# Patient Record
Sex: Female | Born: 1935 | Race: White | Hispanic: No | Smoking: Former smoker
Health system: Southern US, Community
[De-identification: ages and names within clinical notes are randomized; demographics above are authoritative.]

## PROBLEM LIST (undated history)

## (undated) DIAGNOSIS — K219 Gastro-esophageal reflux disease without esophagitis: Secondary | ICD-10-CM

## (undated) DIAGNOSIS — D649 Anemia, unspecified: Secondary | ICD-10-CM

## (undated) DIAGNOSIS — I10 Essential (primary) hypertension: Secondary | ICD-10-CM

## (undated) DIAGNOSIS — N189 Chronic kidney disease, unspecified: Secondary | ICD-10-CM

---

## 2003-02-25 ENCOUNTER — Ambulatory Visit (HOSPITAL_COMMUNITY): Admission: RE | Admit: 2003-02-25 | Discharge: 2003-02-25 | Payer: Self-pay | Admitting: Gastroenterology

## 2003-02-25 ENCOUNTER — Encounter (INDEPENDENT_AMBULATORY_CARE_PROVIDER_SITE_OTHER): Payer: Self-pay | Admitting: Specialist

## 2003-11-06 ENCOUNTER — Other Ambulatory Visit: Admission: RE | Admit: 2003-11-06 | Discharge: 2003-11-06 | Payer: Self-pay | Admitting: Internal Medicine

## 2005-10-20 ENCOUNTER — Ambulatory Visit (HOSPITAL_COMMUNITY): Admission: RE | Admit: 2005-10-20 | Discharge: 2005-10-20 | Payer: Self-pay | Admitting: Ophthalmology

## 2005-10-26 ENCOUNTER — Ambulatory Visit (HOSPITAL_COMMUNITY): Admission: RE | Admit: 2005-10-26 | Discharge: 2005-10-26 | Payer: Self-pay | Admitting: Ophthalmology

## 2005-12-02 ENCOUNTER — Other Ambulatory Visit: Admission: RE | Admit: 2005-12-02 | Discharge: 2005-12-02 | Payer: Self-pay | Admitting: Internal Medicine

## 2007-05-29 ENCOUNTER — Encounter: Admission: RE | Admit: 2007-05-29 | Discharge: 2007-05-29 | Payer: Self-pay | Admitting: Internal Medicine

## 2007-12-11 ENCOUNTER — Other Ambulatory Visit: Admission: RE | Admit: 2007-12-11 | Discharge: 2007-12-11 | Payer: Self-pay | Admitting: Internal Medicine

## 2010-08-25 ENCOUNTER — Other Ambulatory Visit: Payer: Self-pay | Admitting: Internal Medicine

## 2010-08-25 ENCOUNTER — Other Ambulatory Visit (HOSPITAL_COMMUNITY)
Admission: RE | Admit: 2010-08-25 | Discharge: 2010-08-25 | Disposition: A | Discharge status: Home / Self Care | Payer: Medicare Other | Source: Ambulatory Visit | Attending: Internal Medicine | Admitting: Internal Medicine

## 2010-08-25 DIAGNOSIS — Z01419 Encounter for gynecological examination (general) (routine) without abnormal findings: Secondary | ICD-10-CM | POA: Insufficient documentation

## 2010-08-25 DIAGNOSIS — Z1159 Encounter for screening for other viral diseases: Secondary | ICD-10-CM | POA: Insufficient documentation

## 2010-10-30 NOTE — Op Note (Signed)
[name], [name]            ACCOUNT NO.:  [id number]   MEDICAL RECORD NO.:  [id number]          PATIENT TYPE:  AMB   LOCATION:  SDS                          FACILITY:  [facility]   PHYSICIAN:  Robert L. Katy Fitch, M.D.  DATE OF BIRTH:  1928/12/02   DATE OF PROCEDURE:  10/26/2005  DATE OF DISCHARGE:                                 OPERATIVE REPORT   INDICATION AND JUSTIFICATION FOR THE PROCEDURE:  This 77 year old lady has  been followed in my office since November 2006 and initially had moderately  dense bilateral cataracts and has had cataract surgery and now can see 20/25  to about 20/20 with each eye.  She has had severe dermatochalasis with  obstruction of the superior visual field also and we discussed this as a  problem when she was seen October 01, 2005, several months after her cataract  procedures.  Visual field testing with the skin taped compared to when it  was not taped shows a large loss of the superior field and photographs  demonstrate the redundant skin, which is worse on the right than on the  left.  The margin reflex distance is 0 on the right and about 2 mm on the  left.  The problem was discussed and she decided to have upper eyelid  optical blepharoplasties to help her with these symptoms.  She reported that  the skin feels very heavy and that this droops over and blocks the superior  vision.  She can see better when she lifts the skin up.  Otherwise, pressure  are 16 and her vision is 20/25 without correction.  The pupils' motility,  conjunctivae, corneae, anterior chambers and fundus exams are unremarkable  and at the slit-lamp, she does have lens implants.  She has severe  dermatochalasis.  She decided to have upper lid blepharoplasty.   JUSTIFICATION FOR PERFORMING PROCEDURE IN OUTPATIENT SETTING:  Routine.   __________  None.   PREOPERATIVE DIAGNOSIS:  Severe dermatochalasis with visual impairment.   POSTOPERATIVE DIAGNOSIS:  Severe dermatochalasis with  visual impairment.   OPERATION PERFORMED:  Upper eyelid blepharoplasty.   SURGEON:  Robert L. Katy Fitch, M.D.   ANESTHESIA:  Xylocaine 1% with epinephrine.   PROCEDURE:  The patient arrived in the minor surgery room and was prepped  and draped in the routine fashion.  Xylocaine 1% with epinephrine was given  and the skin to be removed was demarcated and then excised along with some  underlying subcutaneous tissue.  Bleeding was controlled with cautery and  pressure and each wound was closed with a running 6-0 nylon suture.  Cold  compresses along with some Polysporin were applied and the patient left the  room, having done nicely.   FOLLOWUP CARE:  The patient is to be seen in my office in 6 days to have the  sutures removed.  She is to keep her head elevated today and use ice packs  today and warm compresses starting tomorrow.           ______________________________  Jaymes Graff Katy Fitch, M.D.     RLG/MEDQ  D:  10/26/2005  T:  10/27/2005  Job:  [id number]

## 2010-10-30 NOTE — Op Note (Signed)
   NAME:  Peggy [name], Peggy [name]                      ACCOUNT NO.:  [id number]   MEDICAL RECORD NO.:  [id number]                   PATIENT TYPE:  AMB   LOCATION:  ENDO                                 FACILITY:  [facility]   PHYSICIAN:  Earle Gell, M.D.                DATE OF BIRTH:  08-08-1928   DATE OF PROCEDURE:  02/25/2003  DATE OF DISCHARGE:                                 OPERATIVE REPORT   PROCEDURE:  Colonoscopy.   PROCEDURE INDICATION:  Ms. Peggy [name] is a 77 year old female born  16-May-1929.  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  She is due for a screening colonoscopy with polypectomy to prevent colon  cancer.   ENDOSCOPIST:  Earle Gell, M.D.   PREMEDICATION:  Versed 5 mg, Demerol 50 mg.   DESCRIPTION OF PROCEDURE:  After obtaining informed consent, Ms. Peggy [name]  was placed in the left lateral decubitus position.  I administered  intravenous Demerol and intravenous Versed to achieve conscious sedation for  the procedure.  The patient's blood pressure, oxygen saturation, and cardiac  rhythm were monitored throughout the procedure and documented in the medical  record.   Anal inspection was normal.  Digital rectal exam was normal.  The Olympus  pediatric colonoscope was introduced into the rectum and advanced to the  cecum.  Colonic preparation for the exam today was excellent.   Rectum normal.   Sigmoid colon and descending colon normal.   Splenic flexure normal.   Transverse colon normal.   Hepatic flexure normal.   Ascending colon normal.   Cecum and ileocecal valve normal.   Biopsies:  Random colonic biopsies were taken from the right colon and the  left colon to rule out microscopic-collagenous colitis.    ASSESSMENT:  Normal proctocolonoscopy to the cecum.  Random colonic biopsies  to rule out microscopic-collagenous colitis pending.                                               Earle Gell, M.D.    MJ/MEDQ  D:   02/25/2003  T:  02/25/2003  Job:  [id number]   cc:   Dwaine Deter, M.D.  [address]  [place] [address]  Fax: [phone]

## 2011-06-21 DIAGNOSIS — D2230 Melanocytic nevi of unspecified part of face: Secondary | ICD-10-CM | POA: Insufficient documentation

## 2011-06-21 DIAGNOSIS — H57819 Brow ptosis, unspecified: Secondary | ICD-10-CM | POA: Insufficient documentation

## 2011-06-21 DIAGNOSIS — Q828 Other specified congenital malformations of skin: Secondary | ICD-10-CM | POA: Diagnosis not present

## 2011-06-21 DIAGNOSIS — H02409 Unspecified ptosis of unspecified eyelid: Secondary | ICD-10-CM | POA: Diagnosis not present

## 2011-08-02 DIAGNOSIS — Q828 Other specified congenital malformations of skin: Secondary | ICD-10-CM | POA: Diagnosis not present

## 2011-08-02 DIAGNOSIS — H02409 Unspecified ptosis of unspecified eyelid: Secondary | ICD-10-CM | POA: Diagnosis not present

## 2011-08-12 DIAGNOSIS — Z01810 Encounter for preprocedural cardiovascular examination: Secondary | ICD-10-CM | POA: Diagnosis not present

## 2011-08-12 DIAGNOSIS — H02409 Unspecified ptosis of unspecified eyelid: Secondary | ICD-10-CM | POA: Diagnosis not present

## 2011-08-12 DIAGNOSIS — Z01812 Encounter for preprocedural laboratory examination: Secondary | ICD-10-CM | POA: Diagnosis not present

## 2011-08-12 DIAGNOSIS — Q828 Other specified congenital malformations of skin: Secondary | ICD-10-CM | POA: Diagnosis not present

## 2011-08-12 DIAGNOSIS — I10 Essential (primary) hypertension: Secondary | ICD-10-CM | POA: Diagnosis not present

## 2011-08-25 DIAGNOSIS — Z0000 Encounter for general adult medical examination without abnormal findings: Secondary | ICD-10-CM | POA: Diagnosis not present

## 2011-08-25 DIAGNOSIS — M949 Disorder of cartilage, unspecified: Secondary | ICD-10-CM | POA: Diagnosis not present

## 2011-08-25 DIAGNOSIS — B351 Tinea unguium: Secondary | ICD-10-CM | POA: Diagnosis not present

## 2011-08-25 DIAGNOSIS — K219 Gastro-esophageal reflux disease without esophagitis: Secondary | ICD-10-CM | POA: Diagnosis not present

## 2011-08-25 DIAGNOSIS — Z79899 Other long term (current) drug therapy: Secondary | ICD-10-CM | POA: Diagnosis not present

## 2011-08-25 DIAGNOSIS — K5289 Other specified noninfective gastroenteritis and colitis: Secondary | ICD-10-CM | POA: Diagnosis not present

## 2011-08-25 DIAGNOSIS — I10 Essential (primary) hypertension: Secondary | ICD-10-CM | POA: Diagnosis not present

## 2011-08-25 DIAGNOSIS — M899 Disorder of bone, unspecified: Secondary | ICD-10-CM | POA: Diagnosis not present

## 2011-08-27 DIAGNOSIS — I10 Essential (primary) hypertension: Secondary | ICD-10-CM | POA: Diagnosis not present

## 2011-08-27 DIAGNOSIS — H02409 Unspecified ptosis of unspecified eyelid: Secondary | ICD-10-CM | POA: Diagnosis not present

## 2011-08-27 DIAGNOSIS — Z7982 Long term (current) use of aspirin: Secondary | ICD-10-CM | POA: Diagnosis not present

## 2011-08-27 DIAGNOSIS — Z79899 Other long term (current) drug therapy: Secondary | ICD-10-CM | POA: Diagnosis not present

## 2011-08-27 DIAGNOSIS — Z87891 Personal history of nicotine dependence: Secondary | ICD-10-CM | POA: Diagnosis not present

## 2011-08-27 DIAGNOSIS — R6889 Other general symptoms and signs: Secondary | ICD-10-CM | POA: Diagnosis not present

## 2011-10-06 DIAGNOSIS — B351 Tinea unguium: Secondary | ICD-10-CM | POA: Diagnosis not present

## 2011-10-06 DIAGNOSIS — R197 Diarrhea, unspecified: Secondary | ICD-10-CM | POA: Diagnosis not present

## 2011-10-06 DIAGNOSIS — Z79899 Other long term (current) drug therapy: Secondary | ICD-10-CM | POA: Diagnosis not present

## 2011-10-29 DIAGNOSIS — R197 Diarrhea, unspecified: Secondary | ICD-10-CM | POA: Diagnosis not present

## 2011-11-18 DIAGNOSIS — Z1231 Encounter for screening mammogram for malignant neoplasm of breast: Secondary | ICD-10-CM | POA: Diagnosis not present

## 2011-11-25 DIAGNOSIS — Z961 Presence of intraocular lens: Secondary | ICD-10-CM | POA: Diagnosis not present

## 2011-11-25 DIAGNOSIS — H26499 Other secondary cataract, unspecified eye: Secondary | ICD-10-CM | POA: Diagnosis not present

## 2011-11-25 DIAGNOSIS — H02059 Trichiasis without entropian unspecified eye, unspecified eyelid: Secondary | ICD-10-CM | POA: Diagnosis not present

## 2011-11-29 DIAGNOSIS — R197 Diarrhea, unspecified: Secondary | ICD-10-CM | POA: Diagnosis not present

## 2012-03-24 DIAGNOSIS — Z23 Encounter for immunization: Secondary | ICD-10-CM | POA: Diagnosis not present

## 2012-05-03 DIAGNOSIS — L989 Disorder of the skin and subcutaneous tissue, unspecified: Secondary | ICD-10-CM | POA: Diagnosis not present

## 2012-05-03 DIAGNOSIS — M79609 Pain in unspecified limb: Secondary | ICD-10-CM | POA: Diagnosis not present

## 2012-08-29 DIAGNOSIS — K5289 Other specified noninfective gastroenteritis and colitis: Secondary | ICD-10-CM | POA: Diagnosis not present

## 2012-08-29 DIAGNOSIS — H6120 Impacted cerumen, unspecified ear: Secondary | ICD-10-CM | POA: Diagnosis not present

## 2012-08-29 DIAGNOSIS — Z1331 Encounter for screening for depression: Secondary | ICD-10-CM | POA: Diagnosis not present

## 2012-08-29 DIAGNOSIS — Z79899 Other long term (current) drug therapy: Secondary | ICD-10-CM | POA: Diagnosis not present

## 2012-08-29 DIAGNOSIS — I10 Essential (primary) hypertension: Secondary | ICD-10-CM | POA: Diagnosis not present

## 2012-08-29 DIAGNOSIS — Z0000 Encounter for general adult medical examination without abnormal findings: Secondary | ICD-10-CM | POA: Diagnosis not present

## 2012-10-31 DIAGNOSIS — M7150 Other bursitis, not elsewhere classified, unspecified site: Secondary | ICD-10-CM | POA: Diagnosis not present

## 2012-10-31 DIAGNOSIS — IMO0002 Reserved for concepts with insufficient information to code with codable children: Secondary | ICD-10-CM | POA: Diagnosis not present

## 2012-10-31 DIAGNOSIS — M79609 Pain in unspecified limb: Secondary | ICD-10-CM | POA: Diagnosis not present

## 2012-10-31 DIAGNOSIS — M2040 Other hammer toe(s) (acquired), unspecified foot: Secondary | ICD-10-CM | POA: Diagnosis not present

## 2012-11-07 DIAGNOSIS — M659 Synovitis and tenosynovitis, unspecified: Secondary | ICD-10-CM | POA: Diagnosis not present

## 2013-05-21 DIAGNOSIS — M5430 Sciatica, unspecified side: Secondary | ICD-10-CM | POA: Diagnosis not present

## 2013-06-17 DIAGNOSIS — J069 Acute upper respiratory infection, unspecified: Secondary | ICD-10-CM | POA: Diagnosis not present

## 2013-09-04 DIAGNOSIS — E559 Vitamin D deficiency, unspecified: Secondary | ICD-10-CM | POA: Diagnosis not present

## 2013-09-04 DIAGNOSIS — L84 Corns and callosities: Secondary | ICD-10-CM | POA: Diagnosis not present

## 2013-09-04 DIAGNOSIS — Z1331 Encounter for screening for depression: Secondary | ICD-10-CM | POA: Diagnosis not present

## 2013-09-04 DIAGNOSIS — Z0000 Encounter for general adult medical examination without abnormal findings: Secondary | ICD-10-CM | POA: Diagnosis not present

## 2013-09-04 DIAGNOSIS — I10 Essential (primary) hypertension: Secondary | ICD-10-CM | POA: Diagnosis not present

## 2013-09-04 DIAGNOSIS — K5289 Other specified noninfective gastroenteritis and colitis: Secondary | ICD-10-CM | POA: Diagnosis not present

## 2013-09-04 DIAGNOSIS — Z79899 Other long term (current) drug therapy: Secondary | ICD-10-CM | POA: Diagnosis not present

## 2013-11-06 DIAGNOSIS — L84 Corns and callosities: Secondary | ICD-10-CM | POA: Diagnosis not present

## 2013-11-06 DIAGNOSIS — L97509 Non-pressure chronic ulcer of other part of unspecified foot with unspecified severity: Secondary | ICD-10-CM | POA: Diagnosis not present

## 2013-11-27 DIAGNOSIS — H02839 Dermatochalasis of unspecified eye, unspecified eyelid: Secondary | ICD-10-CM | POA: Diagnosis not present

## 2013-11-27 DIAGNOSIS — Z961 Presence of intraocular lens: Secondary | ICD-10-CM | POA: Diagnosis not present

## 2013-11-27 DIAGNOSIS — H02059 Trichiasis without entropian unspecified eye, unspecified eyelid: Secondary | ICD-10-CM | POA: Diagnosis not present

## 2013-11-27 DIAGNOSIS — H1045 Other chronic allergic conjunctivitis: Secondary | ICD-10-CM | POA: Diagnosis not present

## 2013-11-27 DIAGNOSIS — H04129 Dry eye syndrome of unspecified lacrimal gland: Secondary | ICD-10-CM | POA: Diagnosis not present

## 2013-12-07 DIAGNOSIS — M2040 Other hammer toe(s) (acquired), unspecified foot: Secondary | ICD-10-CM | POA: Diagnosis not present

## 2014-01-01 DIAGNOSIS — S81809A Unspecified open wound, unspecified lower leg, initial encounter: Secondary | ICD-10-CM | POA: Diagnosis not present

## 2014-01-01 DIAGNOSIS — S81009A Unspecified open wound, unspecified knee, initial encounter: Secondary | ICD-10-CM | POA: Diagnosis not present

## 2014-01-09 DIAGNOSIS — S81009A Unspecified open wound, unspecified knee, initial encounter: Secondary | ICD-10-CM | POA: Diagnosis not present

## 2014-01-09 DIAGNOSIS — S81809A Unspecified open wound, unspecified lower leg, initial encounter: Secondary | ICD-10-CM | POA: Diagnosis not present

## 2014-01-09 DIAGNOSIS — I10 Essential (primary) hypertension: Secondary | ICD-10-CM | POA: Diagnosis not present

## 2014-01-09 DIAGNOSIS — K5289 Other specified noninfective gastroenteritis and colitis: Secondary | ICD-10-CM | POA: Diagnosis not present

## 2014-01-09 DIAGNOSIS — L84 Corns and callosities: Secondary | ICD-10-CM | POA: Diagnosis not present

## 2014-01-09 DIAGNOSIS — R229 Localized swelling, mass and lump, unspecified: Secondary | ICD-10-CM | POA: Diagnosis not present

## 2014-02-25 DIAGNOSIS — S81009A Unspecified open wound, unspecified knee, initial encounter: Secondary | ICD-10-CM | POA: Diagnosis not present

## 2014-02-25 DIAGNOSIS — Z4802 Encounter for removal of sutures: Secondary | ICD-10-CM | POA: Diagnosis not present

## 2014-02-25 DIAGNOSIS — S81809A Unspecified open wound, unspecified lower leg, initial encounter: Secondary | ICD-10-CM | POA: Diagnosis not present

## 2014-03-29 DIAGNOSIS — I8312 Varicose veins of left lower extremity with inflammation: Secondary | ICD-10-CM | POA: Diagnosis not present

## 2014-03-29 DIAGNOSIS — I8311 Varicose veins of right lower extremity with inflammation: Secondary | ICD-10-CM | POA: Diagnosis not present

## 2014-03-29 DIAGNOSIS — D692 Other nonthrombocytopenic purpura: Secondary | ICD-10-CM | POA: Diagnosis not present

## 2014-04-23 DIAGNOSIS — L602 Onychogryphosis: Secondary | ICD-10-CM | POA: Diagnosis not present

## 2014-04-23 DIAGNOSIS — L84 Corns and callosities: Secondary | ICD-10-CM | POA: Diagnosis not present

## 2014-04-23 DIAGNOSIS — M2041 Other hammer toe(s) (acquired), right foot: Secondary | ICD-10-CM | POA: Diagnosis not present

## 2014-04-23 DIAGNOSIS — M2042 Other hammer toe(s) (acquired), left foot: Secondary | ICD-10-CM | POA: Diagnosis not present

## 2014-09-10 DIAGNOSIS — M859 Disorder of bone density and structure, unspecified: Secondary | ICD-10-CM | POA: Diagnosis not present

## 2014-09-10 DIAGNOSIS — K589 Irritable bowel syndrome without diarrhea: Secondary | ICD-10-CM | POA: Diagnosis not present

## 2014-09-10 DIAGNOSIS — K219 Gastro-esophageal reflux disease without esophagitis: Secondary | ICD-10-CM | POA: Diagnosis not present

## 2014-09-10 DIAGNOSIS — Z79899 Other long term (current) drug therapy: Secondary | ICD-10-CM | POA: Diagnosis not present

## 2014-09-10 DIAGNOSIS — Z1389 Encounter for screening for other disorder: Secondary | ICD-10-CM | POA: Diagnosis not present

## 2014-09-10 DIAGNOSIS — Z23 Encounter for immunization: Secondary | ICD-10-CM | POA: Diagnosis not present

## 2014-09-10 DIAGNOSIS — Z0001 Encounter for general adult medical examination with abnormal findings: Secondary | ICD-10-CM | POA: Diagnosis not present

## 2014-09-10 DIAGNOSIS — S81802A Unspecified open wound, left lower leg, initial encounter: Secondary | ICD-10-CM | POA: Diagnosis not present

## 2014-09-10 DIAGNOSIS — I10 Essential (primary) hypertension: Secondary | ICD-10-CM | POA: Diagnosis not present

## 2014-09-10 LAB — BASIC METABOLIC PANEL
BUN: 21 (ref 4–21)
CREATININE: 0.7 (ref 0.5–1.1)
Glucose: 90
Potassium: 4.1 (ref 3.4–5.3)
Sodium: 133 — AB (ref 137–147)

## 2014-09-10 LAB — CBC AND DIFFERENTIAL
HEMATOCRIT: 35 — AB (ref 36–46)
HEMOGLOBIN: 11.7 — AB (ref 12.0–16.0)
PLATELETS: 252 (ref 150–399)
WBC: 6.8

## 2014-09-10 LAB — TSH: TSH: 1.15 (ref 0.41–5.90)

## 2014-09-10 LAB — LIPID PANEL
CHOLESTEROL: 181 (ref 0–200)
HDL: 118 — AB (ref 35–70)

## 2014-09-18 DIAGNOSIS — Z1231 Encounter for screening mammogram for malignant neoplasm of breast: Secondary | ICD-10-CM | POA: Diagnosis not present

## 2014-09-25 DIAGNOSIS — M5432 Sciatica, left side: Secondary | ICD-10-CM | POA: Diagnosis not present

## 2014-10-08 DIAGNOSIS — Z961 Presence of intraocular lens: Secondary | ICD-10-CM | POA: Diagnosis not present

## 2014-10-08 DIAGNOSIS — H02834 Dermatochalasis of left upper eyelid: Secondary | ICD-10-CM | POA: Diagnosis not present

## 2014-10-08 DIAGNOSIS — H26492 Other secondary cataract, left eye: Secondary | ICD-10-CM | POA: Diagnosis not present

## 2014-10-08 DIAGNOSIS — H04123 Dry eye syndrome of bilateral lacrimal glands: Secondary | ICD-10-CM | POA: Diagnosis not present

## 2014-10-08 DIAGNOSIS — H02831 Dermatochalasis of right upper eyelid: Secondary | ICD-10-CM | POA: Diagnosis not present

## 2014-10-08 DIAGNOSIS — H10413 Chronic giant papillary conjunctivitis, bilateral: Secondary | ICD-10-CM | POA: Diagnosis not present

## 2014-10-09 DIAGNOSIS — D239 Other benign neoplasm of skin, unspecified: Secondary | ICD-10-CM | POA: Diagnosis not present

## 2014-10-09 DIAGNOSIS — I872 Venous insufficiency (chronic) (peripheral): Secondary | ICD-10-CM | POA: Diagnosis not present

## 2014-12-30 DIAGNOSIS — L719 Rosacea, unspecified: Secondary | ICD-10-CM | POA: Diagnosis not present

## 2014-12-30 DIAGNOSIS — I872 Venous insufficiency (chronic) (peripheral): Secondary | ICD-10-CM | POA: Diagnosis not present

## 2015-01-06 DIAGNOSIS — H02054 Trichiasis without entropian left upper eyelid: Secondary | ICD-10-CM | POA: Diagnosis not present

## 2015-03-13 DIAGNOSIS — Z23 Encounter for immunization: Secondary | ICD-10-CM | POA: Diagnosis not present

## 2015-03-14 DIAGNOSIS — H26492 Other secondary cataract, left eye: Secondary | ICD-10-CM | POA: Diagnosis not present

## 2015-03-14 DIAGNOSIS — Z961 Presence of intraocular lens: Secondary | ICD-10-CM | POA: Diagnosis not present

## 2015-03-14 DIAGNOSIS — H02059 Trichiasis without entropian unspecified eye, unspecified eyelid: Secondary | ICD-10-CM | POA: Diagnosis not present

## 2015-06-12 DIAGNOSIS — H02834 Dermatochalasis of left upper eyelid: Secondary | ICD-10-CM | POA: Diagnosis not present

## 2015-06-12 DIAGNOSIS — H01025 Squamous blepharitis left lower eyelid: Secondary | ICD-10-CM | POA: Diagnosis not present

## 2015-06-12 DIAGNOSIS — H02054 Trichiasis without entropian left upper eyelid: Secondary | ICD-10-CM | POA: Diagnosis not present

## 2015-06-12 DIAGNOSIS — H01024 Squamous blepharitis left upper eyelid: Secondary | ICD-10-CM | POA: Diagnosis not present

## 2015-06-12 DIAGNOSIS — G245 Blepharospasm: Secondary | ICD-10-CM | POA: Diagnosis not present

## 2015-06-12 DIAGNOSIS — H01021 Squamous blepharitis right upper eyelid: Secondary | ICD-10-CM | POA: Diagnosis not present

## 2015-06-12 DIAGNOSIS — H01022 Squamous blepharitis right lower eyelid: Secondary | ICD-10-CM | POA: Diagnosis not present

## 2015-06-12 DIAGNOSIS — Z961 Presence of intraocular lens: Secondary | ICD-10-CM | POA: Diagnosis not present

## 2015-06-12 DIAGNOSIS — H02831 Dermatochalasis of right upper eyelid: Secondary | ICD-10-CM | POA: Diagnosis not present

## 2015-07-03 DIAGNOSIS — H02834 Dermatochalasis of left upper eyelid: Secondary | ICD-10-CM | POA: Diagnosis not present

## 2015-07-03 DIAGNOSIS — G245 Blepharospasm: Secondary | ICD-10-CM | POA: Diagnosis not present

## 2015-07-03 DIAGNOSIS — H01025 Squamous blepharitis left lower eyelid: Secondary | ICD-10-CM | POA: Diagnosis not present

## 2015-07-03 DIAGNOSIS — H01024 Squamous blepharitis left upper eyelid: Secondary | ICD-10-CM | POA: Diagnosis not present

## 2015-07-03 DIAGNOSIS — H02054 Trichiasis without entropian left upper eyelid: Secondary | ICD-10-CM | POA: Diagnosis not present

## 2015-07-03 DIAGNOSIS — H01022 Squamous blepharitis right lower eyelid: Secondary | ICD-10-CM | POA: Diagnosis not present

## 2015-07-03 DIAGNOSIS — H02831 Dermatochalasis of right upper eyelid: Secondary | ICD-10-CM | POA: Diagnosis not present

## 2015-07-03 DIAGNOSIS — H01021 Squamous blepharitis right upper eyelid: Secondary | ICD-10-CM | POA: Diagnosis not present

## 2015-07-03 DIAGNOSIS — Z961 Presence of intraocular lens: Secondary | ICD-10-CM | POA: Diagnosis not present

## 2015-08-01 DIAGNOSIS — H10022 Other mucopurulent conjunctivitis, left eye: Secondary | ICD-10-CM | POA: Diagnosis not present

## 2015-08-01 DIAGNOSIS — H01021 Squamous blepharitis right upper eyelid: Secondary | ICD-10-CM | POA: Diagnosis not present

## 2015-08-01 DIAGNOSIS — Z961 Presence of intraocular lens: Secondary | ICD-10-CM | POA: Diagnosis not present

## 2015-08-01 DIAGNOSIS — H01024 Squamous blepharitis left upper eyelid: Secondary | ICD-10-CM | POA: Diagnosis not present

## 2015-08-01 DIAGNOSIS — H02831 Dermatochalasis of right upper eyelid: Secondary | ICD-10-CM | POA: Diagnosis not present

## 2015-08-01 DIAGNOSIS — G245 Blepharospasm: Secondary | ICD-10-CM | POA: Diagnosis not present

## 2015-08-01 DIAGNOSIS — H01022 Squamous blepharitis right lower eyelid: Secondary | ICD-10-CM | POA: Diagnosis not present

## 2015-08-01 DIAGNOSIS — H01025 Squamous blepharitis left lower eyelid: Secondary | ICD-10-CM | POA: Diagnosis not present

## 2015-08-01 DIAGNOSIS — H02054 Trichiasis without entropian left upper eyelid: Secondary | ICD-10-CM | POA: Diagnosis not present

## 2015-08-01 DIAGNOSIS — H02834 Dermatochalasis of left upper eyelid: Secondary | ICD-10-CM | POA: Diagnosis not present

## 2015-09-12 DIAGNOSIS — Z961 Presence of intraocular lens: Secondary | ICD-10-CM | POA: Diagnosis not present

## 2015-09-12 DIAGNOSIS — H10022 Other mucopurulent conjunctivitis, left eye: Secondary | ICD-10-CM | POA: Diagnosis not present

## 2015-09-12 DIAGNOSIS — H02834 Dermatochalasis of left upper eyelid: Secondary | ICD-10-CM | POA: Diagnosis not present

## 2015-09-12 DIAGNOSIS — H01024 Squamous blepharitis left upper eyelid: Secondary | ICD-10-CM | POA: Diagnosis not present

## 2015-09-12 DIAGNOSIS — G245 Blepharospasm: Secondary | ICD-10-CM | POA: Diagnosis not present

## 2015-09-12 DIAGNOSIS — H02054 Trichiasis without entropian left upper eyelid: Secondary | ICD-10-CM | POA: Diagnosis not present

## 2015-09-12 DIAGNOSIS — H02831 Dermatochalasis of right upper eyelid: Secondary | ICD-10-CM | POA: Diagnosis not present

## 2015-09-12 DIAGNOSIS — H01025 Squamous blepharitis left lower eyelid: Secondary | ICD-10-CM | POA: Diagnosis not present

## 2015-09-12 DIAGNOSIS — H01022 Squamous blepharitis right lower eyelid: Secondary | ICD-10-CM | POA: Diagnosis not present

## 2015-09-12 DIAGNOSIS — H01021 Squamous blepharitis right upper eyelid: Secondary | ICD-10-CM | POA: Diagnosis not present

## 2015-09-16 DIAGNOSIS — Z79899 Other long term (current) drug therapy: Secondary | ICD-10-CM | POA: Diagnosis not present

## 2015-09-16 DIAGNOSIS — K589 Irritable bowel syndrome without diarrhea: Secondary | ICD-10-CM | POA: Diagnosis not present

## 2015-09-16 DIAGNOSIS — I10 Essential (primary) hypertension: Secondary | ICD-10-CM | POA: Diagnosis not present

## 2015-09-16 DIAGNOSIS — M859 Disorder of bone density and structure, unspecified: Secondary | ICD-10-CM | POA: Diagnosis not present

## 2015-09-16 DIAGNOSIS — K219 Gastro-esophageal reflux disease without esophagitis: Secondary | ICD-10-CM | POA: Diagnosis not present

## 2015-09-16 DIAGNOSIS — Z0001 Encounter for general adult medical examination with abnormal findings: Secondary | ICD-10-CM | POA: Diagnosis not present

## 2015-09-16 DIAGNOSIS — M5432 Sciatica, left side: Secondary | ICD-10-CM | POA: Diagnosis not present

## 2015-09-16 DIAGNOSIS — M40209 Unspecified kyphosis, site unspecified: Secondary | ICD-10-CM | POA: Diagnosis not present

## 2015-09-16 DIAGNOSIS — Z1389 Encounter for screening for other disorder: Secondary | ICD-10-CM | POA: Diagnosis not present

## 2015-09-16 DIAGNOSIS — E559 Vitamin D deficiency, unspecified: Secondary | ICD-10-CM | POA: Diagnosis not present

## 2015-09-16 LAB — VITAMIN D 25 HYDROXY (VIT D DEFICIENCY, FRACTURES): Vit D, 25-Hydroxy: 62.5

## 2015-09-16 LAB — BASIC METABOLIC PANEL
BUN: 20 (ref 4–21)
CREATININE: 0.8 (ref 0.5–1.1)
Glucose: 89
POTASSIUM: 4.4 (ref 3.4–5.3)
SODIUM: 135 — AB (ref 137–147)

## 2015-09-16 LAB — HEPATIC FUNCTION PANEL
ALT: 14 (ref 7–35)
AST: 23 (ref 13–35)
Alkaline Phosphatase: 48 (ref 25–125)
BILIRUBIN, TOTAL: 0.5

## 2015-09-16 LAB — TSH: TSH: 1.18 (ref 0.41–5.90)

## 2015-09-16 LAB — CBC AND DIFFERENTIAL
HEMATOCRIT: 36 (ref 36–46)
Hemoglobin: 12.2 (ref 12.0–16.0)
PLATELETS: 216 (ref 150–399)
WBC: 5.9

## 2015-09-16 LAB — LIPID PANEL
CHOLESTEROL: 134 (ref 0–200)
HDL: 50 (ref 35–70)
LDL Cholesterol: 69
TRIGLYCERIDES: 75 (ref 40–160)

## 2015-10-22 DIAGNOSIS — M859 Disorder of bone density and structure, unspecified: Secondary | ICD-10-CM | POA: Diagnosis not present

## 2015-10-22 DIAGNOSIS — M8589 Other specified disorders of bone density and structure, multiple sites: Secondary | ICD-10-CM | POA: Diagnosis not present

## 2015-11-27 DIAGNOSIS — H02052 Trichiasis without entropian right lower eyelid: Secondary | ICD-10-CM | POA: Diagnosis not present

## 2015-11-27 DIAGNOSIS — H02834 Dermatochalasis of left upper eyelid: Secondary | ICD-10-CM | POA: Diagnosis not present

## 2015-11-27 DIAGNOSIS — H02051 Trichiasis without entropian right upper eyelid: Secondary | ICD-10-CM | POA: Diagnosis not present

## 2015-11-27 DIAGNOSIS — H02831 Dermatochalasis of right upper eyelid: Secondary | ICD-10-CM | POA: Diagnosis not present

## 2015-11-27 DIAGNOSIS — H02054 Trichiasis without entropian left upper eyelid: Secondary | ICD-10-CM | POA: Diagnosis not present

## 2016-03-12 DIAGNOSIS — H01021 Squamous blepharitis right upper eyelid: Secondary | ICD-10-CM | POA: Diagnosis not present

## 2016-03-12 DIAGNOSIS — H02831 Dermatochalasis of right upper eyelid: Secondary | ICD-10-CM | POA: Diagnosis not present

## 2016-03-12 DIAGNOSIS — H02054 Trichiasis without entropian left upper eyelid: Secondary | ICD-10-CM | POA: Diagnosis not present

## 2016-03-12 DIAGNOSIS — H02834 Dermatochalasis of left upper eyelid: Secondary | ICD-10-CM | POA: Diagnosis not present

## 2016-03-12 DIAGNOSIS — G245 Blepharospasm: Secondary | ICD-10-CM | POA: Diagnosis not present

## 2016-03-12 DIAGNOSIS — H01025 Squamous blepharitis left lower eyelid: Secondary | ICD-10-CM | POA: Diagnosis not present

## 2016-03-12 DIAGNOSIS — H01024 Squamous blepharitis left upper eyelid: Secondary | ICD-10-CM | POA: Diagnosis not present

## 2016-03-12 DIAGNOSIS — H01022 Squamous blepharitis right lower eyelid: Secondary | ICD-10-CM | POA: Diagnosis not present

## 2016-03-12 DIAGNOSIS — Z961 Presence of intraocular lens: Secondary | ICD-10-CM | POA: Diagnosis not present

## 2016-03-25 DIAGNOSIS — Z23 Encounter for immunization: Secondary | ICD-10-CM | POA: Diagnosis not present

## 2016-05-17 ENCOUNTER — Encounter (HOSPITAL_COMMUNITY): Payer: Self-pay

## 2016-05-17 ENCOUNTER — Emergency Department (HOSPITAL_COMMUNITY)
Admission: EM | Admit: 2016-05-17 | Discharge: 2016-05-17 | Disposition: A | Discharge status: Home / Self Care | Payer: Medicare Other | Source: Home / Self Care | Attending: Emergency Medicine | Admitting: Emergency Medicine

## 2016-05-17 ENCOUNTER — Emergency Department (HOSPITAL_COMMUNITY): Payer: Medicare Other

## 2016-05-17 DIAGNOSIS — Z7982 Long term (current) use of aspirin: Secondary | ICD-10-CM | POA: Insufficient documentation

## 2016-05-17 DIAGNOSIS — R7881 Bacteremia: Secondary | ICD-10-CM | POA: Diagnosis not present

## 2016-05-17 DIAGNOSIS — N3000 Acute cystitis without hematuria: Secondary | ICD-10-CM

## 2016-05-17 DIAGNOSIS — A419 Sepsis, unspecified organism: Secondary | ICD-10-CM | POA: Diagnosis not present

## 2016-05-17 DIAGNOSIS — E860 Dehydration: Secondary | ICD-10-CM | POA: Diagnosis not present

## 2016-05-17 DIAGNOSIS — E440 Moderate protein-calorie malnutrition: Secondary | ICD-10-CM | POA: Diagnosis not present

## 2016-05-17 DIAGNOSIS — N179 Acute kidney failure, unspecified: Secondary | ICD-10-CM | POA: Diagnosis not present

## 2016-05-17 DIAGNOSIS — N390 Urinary tract infection, site not specified: Secondary | ICD-10-CM | POA: Diagnosis not present

## 2016-05-17 DIAGNOSIS — Z681 Body mass index (BMI) 19 or less, adult: Secondary | ICD-10-CM | POA: Diagnosis not present

## 2016-05-17 DIAGNOSIS — I10 Essential (primary) hypertension: Secondary | ICD-10-CM

## 2016-05-17 DIAGNOSIS — R531 Weakness: Secondary | ICD-10-CM | POA: Diagnosis not present

## 2016-05-17 HISTORY — DX: Essential (primary) hypertension: I10

## 2016-05-17 LAB — URINALYSIS, ROUTINE W REFLEX MICROSCOPIC
BILIRUBIN URINE: NEGATIVE
GLUCOSE, UA: NEGATIVE mg/dL
Ketones, ur: NEGATIVE mg/dL
Nitrite: NEGATIVE
PH: 6 (ref 5.0–8.0)
Protein, ur: >300 mg/dL — AB
SPECIFIC GRAVITY, URINE: 1.015 (ref 1.005–1.030)

## 2016-05-17 LAB — COMPREHENSIVE METABOLIC PANEL
ALT: 30 U/L (ref 14–54)
AST: 37 U/L (ref 15–41)
Albumin: 3.3 g/dL — ABNORMAL LOW (ref 3.5–5.0)
Alkaline Phosphatase: 85 U/L (ref 38–126)
Anion gap: 11 (ref 5–15)
BUN: 43 mg/dL — ABNORMAL HIGH (ref 6–20)
CHLORIDE: 91 mmol/L — AB (ref 101–111)
CO2: 26 mmol/L (ref 22–32)
CREATININE: 1.73 mg/dL — AB (ref 0.44–1.00)
Calcium: 9.3 mg/dL (ref 8.9–10.3)
GFR, EST AFRICAN AMERICAN: 30 mL/min — AB (ref >60)
GFR, EST NON AFRICAN AMERICAN: 26 mL/min — AB (ref >60)
Glucose, Bld: 143 mg/dL — ABNORMAL HIGH (ref 65–99)
POTASSIUM: 3.5 mmol/L (ref 3.5–5.1)
SODIUM: 128 mmol/L — AB (ref 135–145)
Total Bilirubin: 0.9 mg/dL (ref 0.3–1.2)
Total Protein: 6.7 g/dL (ref 6.5–8.1)

## 2016-05-17 LAB — CBC WITH DIFFERENTIAL/PLATELET
Basophils Absolute: 0 10*3/uL (ref 0.0–0.1)
Basophils Relative: 0 %
EOS ABS: 0 10*3/uL (ref 0.0–0.7)
Eosinophils Relative: 0 %
HEMATOCRIT: 32.1 % — AB (ref 36.0–46.0)
HEMOGLOBIN: 11.1 g/dL — AB (ref 12.0–15.0)
LYMPHS ABS: 0.4 10*3/uL — AB (ref 0.7–4.0)
LYMPHS PCT: 2 %
MCH: 32.8 pg (ref 26.0–34.0)
MCHC: 34.6 g/dL (ref 30.0–36.0)
MCV: 95 fL (ref 78.0–100.0)
MONOS PCT: 8 %
Monocytes Absolute: 1.5 10*3/uL — ABNORMAL HIGH (ref 0.1–1.0)
NEUTROS PCT: 90 %
Neutro Abs: 16.4 10*3/uL — ABNORMAL HIGH (ref 1.7–7.7)
Platelets: 197 10*3/uL (ref 150–400)
RBC: 3.38 MIL/uL — AB (ref 3.87–5.11)
RDW: 13.3 % (ref 11.5–15.5)
WBC: 18.3 10*3/uL — AB (ref 4.0–10.5)

## 2016-05-17 LAB — URINE MICROSCOPIC-ADD ON

## 2016-05-17 LAB — I-STAT CG4 LACTIC ACID, ED: LACTIC ACID, VENOUS: 1.59 mmol/L (ref 0.5–1.9)

## 2016-05-17 MED ORDER — ACETAMINOPHEN 500 MG PO TABS
1000.0000 mg | ORAL_TABLET | Freq: Once | ORAL | Status: AC
  Administered 2016-05-17: 1000 mg via ORAL
  Filled 2016-05-17: qty 2

## 2016-05-17 MED ORDER — CEPHALEXIN 500 MG PO CAPS: 500.0000 mg | ORAL_CAPSULE | Freq: Four times a day (QID) | ORAL | 0 refills | Status: DC

## 2016-05-17 MED ORDER — SODIUM CHLORIDE 0.9 % IV BOLUS (SEPSIS)
1000.0000 mL | Freq: Once | INTRAVENOUS | Status: AC
  Administered 2016-05-17: 1000 mL via INTRAVENOUS

## 2016-05-17 NOTE — ED Triage Notes (Signed)
Pt presents with c/o fever that has been persistent for the past couple of days. Pt's family member at bedside also reports that pt has had tremors and shake and has had episodes of delirium at night. Pt is alert at this time and able to answer questions. Pt reports max temp of 102.1 at home.

## 2016-05-18 ENCOUNTER — Encounter (HOSPITAL_COMMUNITY): Payer: Self-pay | Admitting: Emergency Medicine

## 2016-05-18 ENCOUNTER — Inpatient Hospital Stay (HOSPITAL_COMMUNITY)
Admission: EM | Admit: 2016-05-18 | Discharge: 2016-05-20 | DRG: 690 | Disposition: A | Discharge status: Home / Self Care | Payer: Medicare Other | Attending: Internal Medicine | Admitting: Internal Medicine

## 2016-05-18 DIAGNOSIS — N390 Urinary tract infection, site not specified: Secondary | ICD-10-CM

## 2016-05-18 DIAGNOSIS — R7881 Bacteremia: Secondary | ICD-10-CM | POA: Diagnosis not present

## 2016-05-18 DIAGNOSIS — Z681 Body mass index (BMI) 19 or less, adult: Secondary | ICD-10-CM

## 2016-05-18 DIAGNOSIS — Z79899 Other long term (current) drug therapy: Secondary | ICD-10-CM

## 2016-05-18 DIAGNOSIS — E860 Dehydration: Secondary | ICD-10-CM | POA: Diagnosis present

## 2016-05-18 DIAGNOSIS — I10 Essential (primary) hypertension: Secondary | ICD-10-CM | POA: Diagnosis not present

## 2016-05-18 DIAGNOSIS — N179 Acute kidney failure, unspecified: Secondary | ICD-10-CM | POA: Diagnosis not present

## 2016-05-18 DIAGNOSIS — B9620 Unspecified Escherichia coli [E. coli] as the cause of diseases classified elsewhere: Secondary | ICD-10-CM | POA: Diagnosis present

## 2016-05-18 DIAGNOSIS — Z7982 Long term (current) use of aspirin: Secondary | ICD-10-CM

## 2016-05-18 DIAGNOSIS — E440 Moderate protein-calorie malnutrition: Secondary | ICD-10-CM | POA: Insufficient documentation

## 2016-05-18 DIAGNOSIS — E876 Hypokalemia: Secondary | ICD-10-CM | POA: Diagnosis present

## 2016-05-18 DIAGNOSIS — K5900 Constipation, unspecified: Secondary | ICD-10-CM | POA: Diagnosis present

## 2016-05-18 DIAGNOSIS — E871 Hypo-osmolality and hyponatremia: Secondary | ICD-10-CM | POA: Diagnosis present

## 2016-05-18 LAB — I-STAT CG4 LACTIC ACID, ED: Lactic Acid, Venous: 0.97 mmol/L (ref 0.5–1.9)

## 2016-05-18 LAB — BLOOD CULTURE ID PANEL (REFLEXED)
Acinetobacter baumannii: NOT DETECTED
CANDIDA GLABRATA: NOT DETECTED
CANDIDA KRUSEI: NOT DETECTED
CANDIDA TROPICALIS: NOT DETECTED
Candida albicans: NOT DETECTED
Candida parapsilosis: NOT DETECTED
Carbapenem resistance: NOT DETECTED
ESCHERICHIA COLI: DETECTED — AB
Enterobacter cloacae complex: NOT DETECTED
Enterobacteriaceae species: DETECTED — AB
Enterococcus species: NOT DETECTED
Haemophilus influenzae: NOT DETECTED
Klebsiella oxytoca: NOT DETECTED
Klebsiella pneumoniae: NOT DETECTED
Listeria monocytogenes: NOT DETECTED
NEISSERIA MENINGITIDIS: NOT DETECTED
PROTEUS SPECIES: NOT DETECTED
Pseudomonas aeruginosa: NOT DETECTED
SERRATIA MARCESCENS: NOT DETECTED
STAPHYLOCOCCUS SPECIES: NOT DETECTED
STREPTOCOCCUS AGALACTIAE: NOT DETECTED
Staphylococcus aureus (BCID): NOT DETECTED
Streptococcus pneumoniae: NOT DETECTED
Streptococcus pyogenes: NOT DETECTED
Streptococcus species: NOT DETECTED

## 2016-05-18 LAB — BASIC METABOLIC PANEL
ANION GAP: 9 (ref 5–15)
BUN: 55 mg/dL — ABNORMAL HIGH (ref 6–20)
CALCIUM: 9 mg/dL (ref 8.9–10.3)
CO2: 25 mmol/L (ref 22–32)
Chloride: 93 mmol/L — ABNORMAL LOW (ref 101–111)
Creatinine, Ser: 1.91 mg/dL — ABNORMAL HIGH (ref 0.44–1.00)
GFR, EST AFRICAN AMERICAN: 26 mL/min — AB (ref >60)
GFR, EST NON AFRICAN AMERICAN: 23 mL/min — AB (ref >60)
Glucose, Bld: 121 mg/dL — ABNORMAL HIGH (ref 65–99)
POTASSIUM: 3.4 mmol/L — AB (ref 3.5–5.1)
SODIUM: 127 mmol/L — AB (ref 135–145)

## 2016-05-18 LAB — CBC WITH DIFFERENTIAL/PLATELET
BASOS ABS: 0 10*3/uL (ref 0.0–0.1)
BASOS PCT: 0 %
EOS PCT: 0 %
Eosinophils Absolute: 0 10*3/uL (ref 0.0–0.7)
HCT: 34.1 % — ABNORMAL LOW (ref 36.0–46.0)
Hemoglobin: 12.1 g/dL (ref 12.0–15.0)
LYMPHS PCT: 3 %
Lymphs Abs: 0.6 10*3/uL — ABNORMAL LOW (ref 0.7–4.0)
MCH: 32.7 pg (ref 26.0–34.0)
MCHC: 35.5 g/dL (ref 30.0–36.0)
MCV: 92.2 fL (ref 78.0–100.0)
MONO ABS: 1.1 10*3/uL — AB (ref 0.1–1.0)
Monocytes Relative: 5 %
NEUTROS ABS: 18.2 10*3/uL — AB (ref 1.7–7.7)
Neutrophils Relative %: 92 %
PLATELETS: 253 10*3/uL (ref 150–400)
RBC: 3.7 MIL/uL — AB (ref 3.87–5.11)
RDW: 13.1 % (ref 11.5–15.5)
WBC: 19.9 10*3/uL — AB (ref 4.0–10.5)

## 2016-05-18 LAB — CBC
HEMATOCRIT: 31 % — AB (ref 36.0–46.0)
HEMOGLOBIN: 10.9 g/dL — AB (ref 12.0–15.0)
MCH: 32.2 pg (ref 26.0–34.0)
MCHC: 35.2 g/dL (ref 30.0–36.0)
MCV: 91.4 fL (ref 78.0–100.0)
Platelets: 244 10*3/uL (ref 150–400)
RBC: 3.39 MIL/uL — AB (ref 3.87–5.11)
RDW: 13.2 % (ref 11.5–15.5)
WBC: 19.7 10*3/uL — AB (ref 4.0–10.5)

## 2016-05-18 LAB — CREATININE, SERUM
Creatinine, Ser: 1.89 mg/dL — ABNORMAL HIGH (ref 0.44–1.00)
GFR calc Af Amer: 27 mL/min — ABNORMAL LOW (ref >60)
GFR calc non Af Amer: 23 mL/min — ABNORMAL LOW (ref >60)

## 2016-05-18 LAB — I-STAT CHEM 8, ED
BUN: 44 mg/dL — ABNORMAL HIGH (ref 6–20)
Calcium, Ion: 1.2 mmol/L (ref 1.15–1.40)
Chloride: 91 mmol/L — ABNORMAL LOW (ref 101–111)
Creatinine, Ser: 1.9 mg/dL — ABNORMAL HIGH (ref 0.44–1.00)
Glucose, Bld: 117 mg/dL — ABNORMAL HIGH (ref 65–99)
HEMATOCRIT: 38 % (ref 36.0–46.0)
HEMOGLOBIN: 12.9 g/dL (ref 12.0–15.0)
POTASSIUM: 3.3 mmol/L — AB (ref 3.5–5.1)
SODIUM: 128 mmol/L — AB (ref 135–145)
TCO2: 25 mmol/L (ref 0–100)

## 2016-05-18 MED ORDER — ACETAMINOPHEN 650 MG RE SUPP
650.0000 mg | Freq: Four times a day (QID) | RECTAL | Status: DC | PRN
Start: 2016-05-18 — End: 2016-05-20

## 2016-05-18 MED ORDER — ENOXAPARIN SODIUM 30 MG/0.3ML ~~LOC~~ SOLN
30.0000 mg | SUBCUTANEOUS | Status: DC
  Administered 2016-05-18 – 2016-05-19 (×2): 30 mg via SUBCUTANEOUS
  Filled 2016-05-18 (×2): qty 0.3

## 2016-05-18 MED ORDER — SODIUM CHLORIDE 0.9% FLUSH
3.0000 mL | Freq: Two times a day (BID) | INTRAVENOUS | Status: DC
  Administered 2016-05-18 – 2016-05-19 (×3): 3 mL via INTRAVENOUS

## 2016-05-18 MED ORDER — ONDANSETRON HCL 4 MG PO TABS: 4.0000 mg | ORAL_TABLET | Freq: Four times a day (QID) | ORAL | Status: DC | PRN

## 2016-05-18 MED ORDER — ASPIRIN EC 81 MG PO TBEC
81.0000 mg | DELAYED_RELEASE_TABLET | Freq: Every day | ORAL | Status: DC
  Administered 2016-05-19 – 2016-05-20 (×2): 81 mg via ORAL
  Filled 2016-05-18 (×2): qty 1

## 2016-05-18 MED ORDER — SODIUM CHLORIDE 0.9% FLUSH: 3.0000 mL | INTRAVENOUS | Status: DC | PRN

## 2016-05-18 MED ORDER — ENSURE ENLIVE PO LIQD
237.0000 mL | Freq: Two times a day (BID) | ORAL | Status: DC
  Administered 2016-05-19 – 2016-05-20 (×4): 237 mL via ORAL

## 2016-05-18 MED ORDER — SODIUM CHLORIDE 0.9 % IV SOLN: 250.0000 mL | INTRAVENOUS | Status: DC | PRN

## 2016-05-18 MED ORDER — IRBESARTAN 150 MG PO TABS
150.0000 mg | ORAL_TABLET | Freq: Every day | ORAL | Status: DC
  Administered 2016-05-19 – 2016-05-20 (×2): 150 mg via ORAL
  Filled 2016-05-18 (×2): qty 1

## 2016-05-18 MED ORDER — ACETAMINOPHEN 325 MG PO TABS: 650.0000 mg | ORAL_TABLET | Freq: Four times a day (QID) | ORAL | Status: DC | PRN

## 2016-05-18 MED ORDER — ADULT MULTIVITAMIN W/MINERALS CH
1.0000 | ORAL_TABLET | Freq: Every day | ORAL | Status: DC
  Administered 2016-05-19 – 2016-05-20 (×2): 1 via ORAL
  Filled 2016-05-18 (×2): qty 1

## 2016-05-18 MED ORDER — SODIUM CHLORIDE 0.9 % IV SOLN
INTRAVENOUS | Status: DC
  Administered 2016-05-19 (×2): via INTRAVENOUS
  Administered 2016-05-20: 1000 mL via INTRAVENOUS

## 2016-05-18 MED ORDER — HYDROCHLOROTHIAZIDE 25 MG PO TABS
25.0000 mg | ORAL_TABLET | Freq: Every day | ORAL | Status: DC
  Administered 2016-05-19: 25 mg via ORAL
  Filled 2016-05-18: qty 1

## 2016-05-18 MED ORDER — VITAMIN C 500 MG PO TABS
1000.0000 mg | ORAL_TABLET | Freq: Every day | ORAL | Status: DC
  Administered 2016-05-19 – 2016-05-20 (×2): 1000 mg via ORAL
  Filled 2016-05-18 (×2): qty 2

## 2016-05-18 MED ORDER — DEXTROSE 5 % IV SOLN
1.0000 g | INTRAVENOUS | Status: DC
  Administered 2016-05-18: 1 g via INTRAVENOUS
  Filled 2016-05-18 (×2): qty 10

## 2016-05-18 MED ORDER — ONDANSETRON HCL 4 MG/2ML IJ SOLN: 4.0000 mg | Freq: Four times a day (QID) | INTRAMUSCULAR | Status: DC | PRN

## 2016-05-18 NOTE — ED Provider Notes (Signed)
[facility] DEPT Provider Note   CSN: [id number] Arrival date & time: 05/17/16  1109     History   Chief Complaint Chief Complaint  Patient presents with  . Fever    HPI Peggy [name] is a 82 y.o. female.  82 yo F with a chief complaint of fever chills and confusion. This been going on for the past 3 or 4 days. Patient states that just before that happened she had some dysuria and increased frequency. Denies any back pain denies vomiting denies diarrhea. Denies cough or chest pain.   The history is provided by the patient and a relative.  Fever   Pertinent negatives include no chest pain, no vomiting, no congestion and no headaches.  Illness  This is a new problem. The current episode started 2 days ago. The problem occurs constantly. The problem has not changed since onset.Pertinent negatives include no chest pain, no headaches and no shortness of breath. Nothing aggravates the symptoms. Nothing relieves the symptoms. She has tried nothing for the symptoms. The treatment provided no relief.    Past Medical History:  Diagnosis Date  . Hypertension     There are no active problems to display for this patient.   History reviewed. No pertinent surgical history.  OB History    No data available       Home Medications    Prior to Admission medications   Medication Sig Start Date End Date Taking? Authorizing Provider  Ascorbic Acid (VITAMIN C) 1000 MG tablet Take 1,000 mg by mouth daily.   Yes Historical Provider, MD  aspirin EC 81 MG tablet Take 81 mg by mouth daily.   Yes Historical Provider, MD  Calcium Carb-Cholecalciferol (CALCIUM 500 +D) 500-400 MG-UNIT TABS Take 1 tablet by mouth daily.   Yes Historical Provider, MD  hydrochlorothiazide (HYDRODIURIL) 25 MG tablet Take 25 mg by mouth daily.   Yes Historical Provider, MD  Multiple Vitamin (MULTIVITAMIN WITH MINERALS) TABS tablet Take 1 tablet by mouth daily.   Yes Historical Provider, MD  omega-3 acid ethyl  esters (LOVAZA) 1 g capsule Take 1 g by mouth daily.   Yes Historical Provider, MD  valsartan (DIOVAN) 160 MG tablet Take 160 mg by mouth daily.   Yes Historical Provider, MD  cephALEXin (KEFLEX) 500 MG capsule Take 1 capsule (500 mg total) by mouth 4 (four) times daily. 05/17/16   Deno Etienne, DO    Family History No family history on file.  Social History Social History  Substance Use Topics  . Smoking status: Never Smoker  . Smokeless tobacco: Never Used  . Alcohol use No     Allergies   Patient has no known allergies.   Review of Systems Review of Systems  Constitutional: Positive for activity change and fever. Negative for chills.  HENT: Negative for congestion and rhinorrhea.   Eyes: Negative for redness and visual disturbance.  Respiratory: Negative for shortness of breath and wheezing.   Cardiovascular: Negative for chest pain and palpitations.  Gastrointestinal: Negative for nausea and vomiting.  Genitourinary: Positive for dysuria. Negative for urgency.  Musculoskeletal: Negative for arthralgias and myalgias.  Skin: Negative for pallor and wound.  Neurological: Negative for dizziness and headaches.     Physical Exam Updated Vital Signs BP 100/68 (BP Location: Left Arm)   Pulse 68   Temp 98.3 F (36.8 C) (Oral)   Resp 18   Ht 5' 5.5" (1.664 m)   Wt 115 lb (52.2 kg)   SpO2 97%  BMI 18.85 kg/m   Physical Exam  Constitutional: She is oriented to person, place, and time. She appears well-developed and well-nourished. No distress.  HENT:  Head: Normocephalic and atraumatic.  Eyes: EOM are normal. Pupils are equal, round, and reactive to light.  Neck: Normal range of motion. Neck supple.  Cardiovascular: Normal rate and regular rhythm.  Exam reveals no gallop and no friction rub.   No murmur heard. Pulmonary/Chest: Effort normal. She has no wheezes. She has no rales.  Abdominal: Soft. She exhibits no distension and no mass. There is no tenderness. There is  no guarding.  Musculoskeletal: She exhibits no edema or tenderness.  Neurological: She is alert and oriented to person, place, and time.  Skin: Skin is warm and dry. She is not diaphoretic.  Psychiatric: She has a normal mood and affect. Her behavior is normal.  Nursing note and vitals reviewed.    ED Treatments / Results  Labs (all labs ordered are listed, but only abnormal results are displayed) Labs Reviewed  BLOOD CULTURE ID PANEL (REFLEXED) - Abnormal; Notable for the following:       Result Value   Enterobacteriaceae species DETECTED (*)    Escherichia coli DETECTED (*)    All other components within normal limits  COMPREHENSIVE METABOLIC PANEL - Abnormal; Notable for the following:    Sodium 128 (*)    Chloride 91 (*)    Glucose, Bld 143 (*)    BUN 43 (*)    Creatinine, Ser 1.73 (*)    Albumin 3.3 (*)    GFR calc non Af Amer 26 (*)    GFR calc Af Amer 30 (*)    All other components within normal limits  CBC WITH DIFFERENTIAL/PLATELET - Abnormal; Notable for the following:    WBC 18.3 (*)    RBC 3.38 (*)    Hemoglobin 11.1 (*)    HCT 32.1 (*)    Neutro Abs 16.4 (*)    Lymphs Abs 0.4 (*)    Monocytes Absolute 1.5 (*)    All other components within normal limits  URINALYSIS, ROUTINE W REFLEX MICROSCOPIC (NOT AT [facility]) - Abnormal; Notable for the following:    APPearance CLOUDY (*)    Hgb urine dipstick MODERATE (*)    Protein, ur >300 (*)    Leukocytes, UA LARGE (*)    All other components within normal limits  URINE MICROSCOPIC-ADD ON - Abnormal; Notable for the following:    Squamous Epithelial / LPF 0-5 (*)    Bacteria, UA MANY (*)    All other components within normal limits  CULTURE, BLOOD (ROUTINE X 2)  CULTURE, BLOOD (ROUTINE X 2)  URINE CULTURE  I-STAT CG4 LACTIC ACID, ED    EKG  EKG Interpretation  Date/Time:  Monday May 17 2016 11:38:28 EST Ventricular Rate:  86 PR Interval:    QRS Duration: 84 QT Interval:  374 QTC Calculation: 448 R  Axis:   0 Text Interpretation:  Sinus rhythm Atrial premature complexes Borderline T abnormalities, inferior leads No old tracing to compare Confirmed by Jalal Rauch MD, DANIEL [phone]) on 05/17/2016 11:43:55 AM       Radiology Dg Chest 2 View  Result Date: 05/17/2016 CLINICAL DATA:  Sepsis EXAM: CHEST  2 VIEW COMPARISON:  None. FINDINGS: Cardiomegaly and aortic tortuosity. There is no edema, consolidation, or pneumothorax. Posterior costophrenic sulcus blunting on the left which could be trace fluid or scarring. Exaggerated thoracic kyphosis hand prominent lumbar levoscoliosis. No acute osseous finding. IMPRESSION: No  evidence for pneumonia. Electronically Signed   By: Monte Fantasia M.D.   On: 05/17/2016 12:35    Procedures Procedures (including critical care time)  Medications Ordered in ED Medications  acetaminophen (TYLENOL) tablet 1,000 mg (1,000 mg Oral Given 05/17/16 1152)  sodium chloride 0.9 % bolus 1,000 mL (0 mLs Intravenous Stopped 05/17/16 1356)     Initial Impression / Assessment and Plan / ED Course  I have reviewed the triage vital signs and the nursing notes.  Pertinent labs & imaging results that were available during my care of the patient were reviewed by me and considered in my medical decision making (see chart for details).  Clinical Course     82 yo F With a chief complaint of fever and confusion. Patient found to have a urinary tract infection. Will start on antibiotics. Offered admission patient requesting discharge home.  9:52 AM:  I have discussed the diagnosis/risks/treatment options with the patient and family and believe the pt to be eligible for discharge home to follow-up with PCP. We also discussed returning to the ED immediately if new or worsening sx occur. We discussed the sx which are most concerning (e.g., sudden worsening pain, fever, inability to tolerate by mouth) that necessitate immediate return. Medications administered to the patient during their  visit and any new prescriptions provided to the patient are listed below.  Medications given during this visit Medications  acetaminophen (TYLENOL) tablet 1,000 mg (1,000 mg Oral Given 05/17/16 1152)  sodium chloride 0.9 % bolus 1,000 mL (0 mLs Intravenous Stopped 05/17/16 1356)     The patient appears reasonably screen and/or stabilized for discharge and I doubt any other medical condition or other [facility] requiring further screening, evaluation, or treatment in the ED at this time prior to discharge.    Final Clinical Impressions(s) / ED Diagnoses   Final diagnoses:  Acute cystitis without hematuria    New Prescriptions Discharge Medication List as of 05/17/2016  1:49 PM    START taking these medications   Details  cephALEXin (KEFLEX) 500 MG capsule Take 1 capsule (500 mg total) by mouth 4 (four) times daily., Starting Mon 05/17/2016, [place], DO 05/18/16 [phone]

## 2016-05-18 NOTE — ED Provider Notes (Signed)
[facility] DEPT Provider Note   CSN: [id number] Arrival date & time: 05/18/16  1529     History   Chief Complaint Chief Complaint  Patient presents with  . positibe blood culture    HPI Peggy [name] is a 82 y.o. female.  HPI Patient was sent in for positive blood cultures. Seen yesterday ER for urinary tract infection. Had said dysuria and fever. Still felt somewhat fatigued. No more fevers but is not really improved either. Do not want to be admitted yesterday. Blood cultures have come back with all bottles positive for Escherichia coli and Enterobacter. Urine culture showed Escherichia coli. Was discharged on Keflex. States she is not feeling worse but is not feeling better.   Past Medical History:  Diagnosis Date  . Hypertension     There are no active problems to display for this patient.   History reviewed. No pertinent surgical history.  OB History    No data available       Home Medications    Prior to Admission medications   Medication Sig Start Date End Date Taking? Authorizing Provider  Ascorbic Acid (VITAMIN C) 1000 MG tablet Take 1,000 mg by mouth daily.   Yes Historical Provider, MD  aspirin EC 81 MG tablet Take 81 mg by mouth daily.   Yes Historical Provider, MD  Calcium Carb-Cholecalciferol (CALCIUM 500 +D) 500-400 MG-UNIT TABS Take 1 tablet by mouth daily.   Yes Historical Provider, MD  cephALEXin (KEFLEX) 500 MG capsule Take 1 capsule (500 mg total) by mouth 4 (four) times daily. 05/17/16  Yes Deno Etienne, DO  hydrochlorothiazide (HYDRODIURIL) 25 MG tablet Take 25 mg by mouth daily.   Yes Historical Provider, MD  Multiple Vitamin (MULTIVITAMIN WITH MINERALS) TABS tablet Take 1 tablet by mouth daily.   Yes Historical Provider, MD  omega-3 acid ethyl esters (LOVAZA) 1 g capsule Take 1 g by mouth daily.   Yes Historical Provider, MD  valsartan (DIOVAN) 160 MG tablet Take 160 mg by mouth daily.   Yes Historical Provider, MD    Family History No  family history on file.  Social History Social History  Substance Use Topics  . Smoking status: Never Smoker  . Smokeless tobacco: Never Used  . Alcohol use No     Allergies   Patient has no known allergies.   Review of Systems Review of Systems  Constitutional: Positive for fever.  HENT: Negative for ear pain.   Respiratory: Negative for shortness of breath.   Cardiovascular: Negative for chest pain.  Gastrointestinal: Negative for abdominal pain.  Genitourinary: Positive for dysuria.  Musculoskeletal: Negative for back pain.  Skin: Negative for wound.  Neurological: Positive for weakness.  Psychiatric/Behavioral: The patient is not hyperactive.      Physical Exam Updated Vital Signs BP 104/70 (BP Location: Left Arm)   Pulse 87   Temp 97.6 F (36.4 C) (Oral)   Resp 18   Ht '5\' 5"'$  (1.651 m)   Wt 115 lb (52.2 kg)   SpO2 97%   BMI 19.14 kg/m   Physical Exam  Constitutional: She appears well-developed.  HENT:  Head: Atraumatic.  Neck: Neck supple.  Cardiovascular: Normal rate.   Pulmonary/Chest: Effort normal.  Abdominal: Soft. There is no tenderness.  Musculoskeletal: Normal range of motion.  Neurological: She is alert.  Skin: Skin is warm. Capillary refill takes less than 2 seconds.  Psychiatric: She has a normal mood and affect.     ED Treatments / Results  Labs (all  labs ordered are listed, but only abnormal results are displayed) Labs Reviewed  CBC WITH DIFFERENTIAL/PLATELET  I-STAT CG4 LACTIC ACID, ED  I-STAT CHEM 8, ED    EKG  EKG Interpretation None       Radiology Dg Chest 2 View  Result Date: 05/17/2016 CLINICAL DATA:  Sepsis EXAM: CHEST  2 VIEW COMPARISON:  None. FINDINGS: Cardiomegaly and aortic tortuosity. There is no edema, consolidation, or pneumothorax. Posterior costophrenic sulcus blunting on the left which could be trace fluid or scarring. Exaggerated thoracic kyphosis hand prominent lumbar levoscoliosis. No acute osseous  finding. IMPRESSION: No evidence for pneumonia. Electronically Signed   By: Monte Fantasia M.D.   On: 05/17/2016 12:35    Procedures Procedures (including critical care time)  Medications Ordered in ED Medications - No data to display   Initial Impression / Assessment and Plan / ED Course  I have reviewed the triage vital signs and the nursing notes.  Pertinent labs & imaging results that were available during my care of the patient were reviewed by me and considered in my medical decision making (see chart for details).  Clinical Course    Patient with positive urine and blood cultures. Patient has been stable. Discussed with hospitalist and will admit patient.  Final Clinical Impressions(s) / ED Diagnoses   Final diagnoses:  Bacteremia  Urinary tract infection without hematuria, site unspecified    New Prescriptions New Prescriptions   No medications on file     Davonna Belling, MD 05/18/16 1729

## 2016-05-18 NOTE — H&P (Signed)
History and Physical  Peggy [name] ZJI:[id number] DOB: 1929/04/19 DOA: 05/18/2016  Referring physician: Delphina Cahill, ER physician  PCP: Henrine Screws, MD  Outpatient Specialists: None Patient coming from: Independent living & is able to ambulate without assistance  Chief Complaint: Positive blood cultures   HPI: Peggy [name] is a 82 y.o. female with medical history significant of hypertension in excellent health who resides in independent living. Less than a week ago, she had several days where she felt very weak and had chills. She had an event in the last 24 hours where she was so weak, she collapsed, she did not pass out. She was evaluated by the physician at her independent living facility and referred over to the emergency room on 12/4.  ED Course: In the emergency room, patient was noted to have a white count of 18, acute kidney injury with a creatinine of 1.73 and elevated BUN and a large UTI. Patient was otherwise stable low with normal lipid pressure, heart rate and no fever. Lactic acid level normal. She was prescribed Keflex for UTI. She was sent back to her independent living facility and was doing okay when she was called by the emergency room that her blood cultures came back positive for Enterobacter and Escherichia coli.  Patient came back to the emergency room today, 12/5. Vital signs were still stable. It was felt that patient was caught before she could go into sepsis. Because cultures were not yet back, it was felt best that patient be observed overnight on IV antibiotics until cultures return  Review of Systems: Patient seen in the emergency room . She does note some mild weakness and some dysuria Pt denies any complaints. She denies any headaches, vision changes, dysphagia, chest pain, palpitations, shortness of breath, wheeze, cough, abdominal pain, hematuria, constipation or diarrhea, focal extremity numbness weakness or pain. Her review of systems is  otherwise negative.    Past Medical History:  Diagnosis Date  . Hypertension    History reviewed. No pertinent surgical history.  Social History:  reports that she has never smoked. She has never used smokeless tobacco. She reports that she does not drink alcohol or use drugs. She resides in independent living and is able to ambulate without use of a walker or cane. She lives with her husband who she cares for  No Known Allergies  Family history: High blood pressure  Prior to Admission medications   Medication Sig Start Date End Date Taking? Authorizing Provider  Ascorbic Acid (VITAMIN C) 1000 MG tablet Take 1,000 mg by mouth daily.   Yes Historical Provider, MD  aspirin EC 81 MG tablet Take 81 mg by mouth daily.   Yes Historical Provider, MD  Calcium Carb-Cholecalciferol (CALCIUM 500 +D) 500-400 MG-UNIT TABS Take 1 tablet by mouth daily.   Yes Historical Provider, MD  cephALEXin (KEFLEX) 500 MG capsule Take 1 capsule (500 mg total) by mouth 4 (four) times daily. 05/17/16  Yes Deno Etienne, DO  hydrochlorothiazide (HYDRODIURIL) 25 MG tablet Take 25 mg by mouth daily.   Yes Historical Provider, MD  Multiple Vitamin (MULTIVITAMIN WITH MINERALS) TABS tablet Take 1 tablet by mouth daily.   Yes Historical Provider, MD  omega-3 acid ethyl esters (LOVAZA) 1 g capsule Take 1 g by mouth daily.   Yes Historical Provider, MD  valsartan (DIOVAN) 160 MG tablet Take 160 mg by mouth daily.   Yes Historical Provider, MD    Physical Exam: BP 104/70 (BP Location: Left Arm)   Pulse  87   Temp 97.6 F (36.4 C) (Oral)   Resp 18   Ht '5\' 5"'$  (1.651 m)   Wt 52.2 kg (115 lb)   SpO2 97%   BMI 19.14 kg/m   General:  Alert and oriented 3, no acute distress  Eyes: Sclera nonicteric, tracking movements are intact  ENT: Normocephalic major medical extremities are dry  Neck: No JVD  Cardiovascular: Regular rate and rhythm, S1-S2  Respiratory: Clear to auscultation bilaterally  Abdomen: Soft, nontender,  nondistended, positive bowel sounds  Skin: No skin breaks, tears or lesions  Musculoskeletal: No clubbing or cyanosis or edema  Psychiatric: Patient is appropriate, no evidence of psychoses Neuro: No focal deficits   (Anything < 9 systems with 2 bullets each down codes to level 1) (If patient refuses exam can't bill higher level) (Make sure to document decubitus ulcers present on admission -- if possible -- and whether patient has chronic indwelling catheter at time of admission)         Labs on Admission:  Basic Metabolic Panel:  Recent Labs Lab 05/17/16 1142 05/18/16 1756  NA 128* 128*  K 3.5 3.3*  CL 91* 91*  CO2 26  --   GLUCOSE 143* 117*  BUN 43* 44*  CREATININE 1.73* 1.90*  CALCIUM 9.3  --    Liver Function Tests:  Recent Labs Lab 05/17/16 1142  AST 37  ALT 30  ALKPHOS 85  BILITOT 0.9  PROT 6.7  ALBUMIN 3.3*   No results for input(s): LIPASE, AMYLASE in the last 168 hours. No results for input(s): AMMONIA in the last 168 hours. CBC:  Recent Labs Lab 05/17/16 1142 05/18/16 1756  WBC 18.3*  --   NEUTROABS 16.4*  --   HGB 11.1* 12.9  HCT 32.1* 38.0  MCV 95.0  --   PLT 197  --    Cardiac Enzymes: No results for input(s): CKTOTAL, CKMB, CKMBINDEX, TROPONINI in the last 168 hours.  BNP (last 3 results) No results for input(s): BNP in the last 8760 hours.  ProBNP (last 3 results) No results for input(s): PROBNP in the last 8760 hours.  CBG: No results for input(s): GLUCAP in the last 168 hours.  Radiological Exams on Admission: Dg Chest 2 View  Result Date: 05/17/2016 CLINICAL DATA:  Sepsis EXAM: CHEST  2 VIEW COMPARISON:  None. FINDINGS: Cardiomegaly and aortic tortuosity. There is no edema, consolidation, or pneumothorax. Posterior costophrenic sulcus blunting on the left which could be trace fluid or scarring. Exaggerated thoracic kyphosis hand prominent lumbar levoscoliosis. No acute osseous finding. IMPRESSION: No evidence for pneumonia.  Electronically Signed   By: Monte Fantasia M.D.   On: 05/17/2016 12:35    EKG: Independently reviewed. Done 12/4. Sinus rhythm with nonspecific T-wave abnormalities, few PACs   Assessment/Plan Present on Admission: . Bacteremia secondary to Escherichia coli and Enterobacter: Stable. Lactic acid level normal. This is not sepsis and was caught in time fortunately. We'll cover with IV Rocephin. Await sensitivities. The patient remains stable, could discharge home on by mouth antibiotics tomorrow as per her preference as well  . Hypertension: Stable. Will continue home medications  . AKI (acute kidney injury) ([place]): Likely secondary UTI and dehydration. We do not have previous labs to compare. Her BUN was 43, creatinine 1.73 on 12/4 when she visited the ER the first time. This ratio likely indicates prerenal dehydration. We'll gently hydrate with IV fluids and recheck in the morning  Question her protein calorie malnutrition: Last nutrition to see. Noted  albumin of 3.3.  Principal Problem:   Bacteremia Active Problems:   Hypertension   AKI (acute kidney injury) ([place])   DVT prophylaxis: Lovenox   Code Status: Full code for now, patient states that no one has ever really talk to her about it before. She had daughter will think about it although I suspect they may opt to change to DO NOT RESUSCITATE and eventually   Family Communication: Daughter at the bedside   Disposition Plan: At this but discharge home tomorrow   Consults called: None   Admission status: Expect patient should be able to go home tomorrow as long she remained stable on by mouth antibiotics      Annita Brod MD [facility] Pager [phone]  If 7PM-7AM, please contact night-coverage [email or web address] Password [email or web address]  05/18/2016, 5:59 PM

## 2016-05-18 NOTE — ED Triage Notes (Signed)
Pt was called from blood culture positive for Ecoli, all 4 bottles. Was seen yesterday for confusion and fever. Pt co weakness.

## 2016-05-19 DIAGNOSIS — N179 Acute kidney failure, unspecified: Secondary | ICD-10-CM

## 2016-05-19 DIAGNOSIS — E440 Moderate protein-calorie malnutrition: Secondary | ICD-10-CM | POA: Diagnosis not present

## 2016-05-19 DIAGNOSIS — B9620 Unspecified Escherichia coli [E. coli] as the cause of diseases classified elsewhere: Secondary | ICD-10-CM

## 2016-05-19 DIAGNOSIS — Z681 Body mass index (BMI) 19 or less, adult: Secondary | ICD-10-CM | POA: Diagnosis not present

## 2016-05-19 DIAGNOSIS — I10 Essential (primary) hypertension: Secondary | ICD-10-CM | POA: Diagnosis not present

## 2016-05-19 DIAGNOSIS — E876 Hypokalemia: Secondary | ICD-10-CM | POA: Diagnosis present

## 2016-05-19 DIAGNOSIS — N390 Urinary tract infection, site not specified: Principal | ICD-10-CM

## 2016-05-19 DIAGNOSIS — R7881 Bacteremia: Secondary | ICD-10-CM

## 2016-05-19 DIAGNOSIS — E871 Hypo-osmolality and hyponatremia: Secondary | ICD-10-CM | POA: Diagnosis present

## 2016-05-19 DIAGNOSIS — Z79899 Other long term (current) drug therapy: Secondary | ICD-10-CM | POA: Diagnosis not present

## 2016-05-19 DIAGNOSIS — E860 Dehydration: Secondary | ICD-10-CM | POA: Diagnosis present

## 2016-05-19 DIAGNOSIS — R531 Weakness: Secondary | ICD-10-CM | POA: Diagnosis not present

## 2016-05-19 DIAGNOSIS — K5900 Constipation, unspecified: Secondary | ICD-10-CM | POA: Diagnosis present

## 2016-05-19 DIAGNOSIS — Z7982 Long term (current) use of aspirin: Secondary | ICD-10-CM | POA: Diagnosis not present

## 2016-05-19 LAB — URINE CULTURE: Culture: >=100000 — AB

## 2016-05-19 LAB — CBC
HCT: 30.9 % — ABNORMAL LOW (ref 36.0–46.0)
Hemoglobin: 10.8 g/dL — ABNORMAL LOW (ref 12.0–15.0)
MCH: 32.4 pg (ref 26.0–34.0)
MCHC: 35 g/dL (ref 30.0–36.0)
MCV: 92.8 fL (ref 78.0–100.0)
PLATELETS: 273 10*3/uL (ref 150–400)
RBC: 3.33 MIL/uL — AB (ref 3.87–5.11)
RDW: 13.4 % (ref 11.5–15.5)
WBC: 18.8 10*3/uL — AB (ref 4.0–10.5)

## 2016-05-19 LAB — BASIC METABOLIC PANEL
ANION GAP: 9 (ref 5–15)
BUN: 51 mg/dL — ABNORMAL HIGH (ref 6–20)
CALCIUM: 8.8 mg/dL — AB (ref 8.9–10.3)
CO2: 25 mmol/L (ref 22–32)
CREATININE: 1.77 mg/dL — AB (ref 0.44–1.00)
Chloride: 95 mmol/L — ABNORMAL LOW (ref 101–111)
GFR calc Af Amer: 29 mL/min — ABNORMAL LOW (ref >60)
GFR, EST NON AFRICAN AMERICAN: 25 mL/min — AB (ref >60)
Glucose, Bld: 106 mg/dL — ABNORMAL HIGH (ref 65–99)
Potassium: 3.4 mmol/L — ABNORMAL LOW (ref 3.5–5.1)
Sodium: 129 mmol/L — ABNORMAL LOW (ref 135–145)

## 2016-05-19 MED ORDER — DEXTROSE 5 % IV SOLN
2.0000 g | INTRAVENOUS | Status: DC
  Administered 2016-05-19: 2 g via INTRAVENOUS
  Filled 2016-05-19 (×2): qty 2

## 2016-05-19 MED ORDER — SENNOSIDES-DOCUSATE SODIUM 8.6-50 MG PO TABS
1.0000 | ORAL_TABLET | Freq: Two times a day (BID) | ORAL | Status: DC
  Administered 2016-05-19 – 2016-05-20 (×3): 1 via ORAL
  Filled 2016-05-19 (×3): qty 1

## 2016-05-19 MED ORDER — POTASSIUM CHLORIDE CRYS ER 20 MEQ PO TBCR
40.0000 meq | EXTENDED_RELEASE_TABLET | Freq: Once | ORAL | Status: AC
  Administered 2016-05-19: 40 meq via ORAL
  Filled 2016-05-19: qty 2

## 2016-05-19 MED ORDER — BISACODYL 10 MG RE SUPP
10.0000 mg | Freq: Once | RECTAL | Status: AC
  Administered 2016-05-19: 10 mg via RECTAL
  Filled 2016-05-19: qty 1

## 2016-05-19 NOTE — Progress Notes (Signed)
PROGRESS NOTE    Peggy [name]  PYP:[id number] DOB: [birth year] DOA: 05/18/2016 PCP: Henrine Screws, MD   Brief Narrative:  Peggy [name] is a 82 y.o. female with medical history significant of Hypertension in excellent health who resides in independent living. Less than a week ago, she had several days where she felt very weak and had chills. She had an event in the last 24 hours where she was so weak, she collapsed, she did not pass out. She was evaluated by the physician at her independent living facility and referred over to the emergency room on 12/4. In the emergency room, patient was noted to have a white count of 18, acute kidney injury with a creatinine of 1.73 and elevated BUN and a large UTI. Patient was otherwise stable with a normal lactic acid level. She was prescribed Keflex for UTI andsent back to her independent living facility and was doing okay when she was called by the emergency room that her blood cultures came back positive for  Enterobacteriaceae Species-Escherichia coli. Patient came back to the Hospital and was placed in Observation.   Assessment & Plan:   Principal Problem:   Bacteremia Active Problems:   Hypertension   AKI (acute kidney injury) ([place])   Malnutrition of moderate degree  Gram Negative Bacteremia from E.Coli (Enterobacteriace Species) -Blood Cx + and Urine Cx 05/17/2016 showed >100,00 CFU of E Coli sensitive to Ceftriaxone -C/w NS at 75 mL/hr and with Acetaminophen 650 mg po q6hprn for Mild Pain or Fever >101 -C/w IV Ceftriaxone q24h (increased from 1 gram q24h to 2 grams q24h) and likely change to Vantin 400 mg po q12h/q24h pending Cr Clearance x 2 weeks -WBC improved from 19.7 -> 18.8 -C/w Zofran for Nausea  -Repeat CBC, CMP in AM -Repeat Blood Cx Today  Constipation -Patient has not had a Bowel Movement in 5 days -Started Senna-Docusate 1 tab po BID and gave Patient 10 mg of Bisacodyl Suppository  Rectally  Hypokalemia -Patient's K+ was 3.4 -Repleted with Kcl tablet 40 mEQ x 1 -Repeat CMP in AM  Hyponatremia -Patient's Sodium went from 127 -> 129  -C/w IVF Rehydration with NS at 75 mL/hr -Repeat CMP in AM  AKI 2/2 to Dehydration from UTI/Bacteremia -Unknown Baseline however on Admission Cr has been ranging from 1.73-1.91 -BUN/Cr went from 55/1.91 -> 51/1.77 -D/C'd HCTZ -C/w IVF NS at 75 mL/hr -C/w Irbesartan 150 mg po Daily -Avoid all other Nephrotoxics  DVT prophylaxis: Lovenox Code Status: FULL CODE Family Communication: No Family Present at Bedside Disposition Plan: Back to [place] likely tomorrow  Consultants:   None  Procedures: None  Antimicrobials: IV Ceftriaxone increased from 1 gram to 2 grams q24h;   Subjective: Seen and examined at bedside and was doing well. No N/V/Abdominal Pain or Chest Pain but stated legs were discolored and hurt occasionally. No Burning or discomfort in Urine and patient denied any fevers or chills. No other concerns or complaints except that she has not had a bowel movement in 5 days.   Objective: Vitals:   05/18/16 1536 05/18/16 1852 05/18/16 2117 05/19/16 0520  BP: 104/70 136/76 118/71 138/77  Pulse: 87 81 82 83  Resp: '18 16 16 16  '$ Temp: 97.6 F (36.4 C) 99 F (37.2 C) 99.3 F (37.4 C) 99.7 F (37.6 C)  TempSrc: Oral Oral Oral Oral  SpO2: 97% 97% 97% 95%  Weight: 52.2 kg (115 lb) 52.2 kg (115 lb)    Height: '5\' 5"'$  (1.651 m)  $'5\' 5"'o$  (1.651 m)      Intake/Output Summary (Last 24 hours) at 05/19/16 1458 Last data filed at 05/19/16 1317  Gross per 24 hour  Intake            860.5 ml  Output                0 ml  Net            860.5 ml   Filed Weights   05/18/16 1536 05/18/16 1852  Weight: 52.2 kg (115 lb) 52.2 kg (115 lb)   Examination: Physical Exam:  Constitutional: WN/WD, NAD and appears calm and comfortable Eyes: Lids and conjunctivae normal, sclerae anicteric  ENMT: External Ears, Nose  appear normal. Grossly normal hearing.    Neck: Appears normal, supple, no cervical masses, normal ROM, no appreciable thyromegaly; no JVD Respiratory: Clear to auscultation bilaterally, no wheezing, rales, rhonchi or crackles. Normal respiratory effort and patient is not tachypenic. No accessory muscle use.  Cardiovascular: RRR, no murmurs / rubs / gallops. S1 and S2 auscultated. No extremity edema.  Abdomen: Soft, non-tender to palpation, non-distended. No masses palpated. No appreciable hepatosplenomegaly. Bowel sounds positive x4.  GU: Deferred. Musculoskeletal: No clubbing / cyanosis of digits/nails. No joint deformity upper and lower extremities. No contractures. Normal strength and muscle tone.  Skin: No rashes, lesions, ulcers on limited skin evaluation. No induration; Warm and dry.  Neurologic: CN 2-12 grossly intact with no focal deficits. Sensation intact in all 4 Extremities. Romberg sign cerebellar reflexes not assessed.  Psychiatric: Normal judgment and insight. Alert and oriented x 3. Normal and pleasant mood and appropriate affect.   Data Reviewed: I have personally reviewed following labs and imaging studies  CBC:  Recent Labs Lab 05/17/16 1142 05/18/16 1745 05/18/16 1756 05/18/16 2002 05/19/16 0354  WBC 18.3* 19.9*  --  19.7* 18.8*  NEUTROABS 16.4* 18.2*  --   --   --   HGB 11.1* 12.1 12.9 10.9* 10.8*  HCT 32.1* 34.1* 38.0 31.0* 30.9*  MCV 95.0 92.2  --  91.4 92.8  PLT 197 253  --  244 939   Basic Metabolic Panel:  Recent Labs Lab 05/17/16 1142 05/18/16 1756 05/18/16 2002 05/19/16 0354  NA 128* 128* 127* 129*  K 3.5 3.3* 3.4* 3.4*  CL 91* 91* 93* 95*  CO2 26  --  25 25  GLUCOSE 143* 117* 121* 106*  BUN 43* 44* 55* 51*  CREATININE 1.73* 1.90* 1.89*  1.91* 1.77*  CALCIUM 9.3  --  9.0 8.8*   GFR: Estimated Creatinine Clearance: 19.1 mL/min (by C-G formula based on SCr of 1.77 mg/dL (H)). Liver Function Tests:  Recent Labs Lab 05/17/16 1142  AST 37   ALT 30  ALKPHOS 85  BILITOT 0.9  PROT 6.7  ALBUMIN 3.3*   No results for input(s): LIPASE, AMYLASE in the last 168 hours. No results for input(s): AMMONIA in the last 168 hours. Coagulation Profile: No results for input(s): INR, PROTIME in the last 168 hours. Cardiac Enzymes: No results for input(s): CKTOTAL, CKMB, CKMBINDEX, TROPONINI in the last 168 hours. BNP (last 3 results) No results for input(s): PROBNP in the last 8760 hours. HbA1C: No results for input(s): HGBA1C in the last 72 hours. CBG: No results for input(s): GLUCAP in the last 168 hours. Lipid Profile: No results for input(s): CHOL, HDL, LDLCALC, TRIG, CHOLHDL, LDLDIRECT in the last 72 hours. Thyroid Function Tests: No results for input(s): TSH, T4TOTAL, FREET4, T3FREE, THYROIDAB in the last  72 hours. Anemia Panel: No results for input(s): VITAMINB12, FOLATE, FERRITIN, TIBC, IRON, RETICCTPCT in the last 72 hours. Sepsis Labs:  Recent Labs Lab 05/17/16 1158 05/18/16 1756  LATICACIDVEN 1.59 0.97    Recent Results (from the past 240 hour(s))  Blood Culture (routine x 2)     Status: None (Preliminary result)   Collection Time: 05/17/16 11:42 AM  Result Value Ref Range Status   Specimen Description BLOOD RIGHT ANTECUBITAL  Final   Special Requests BOTTLES DRAWN AEROBIC AND ANAEROBIC 5CC  Final   Culture  Setup Time   Final    GRAM NEGATIVE RODS IN BOTH AEROBIC AND ANAEROBIC BOTTLES CRITICAL VALUE NOTED.  VALUE IS CONSISTENT WITH PREVIOUSLY REPORTED AND CALLED VALUE.    Culture   Final    GRAM NEGATIVE RODS IDENTIFICATION AND SUSCEPTIBILITIES TO FOLLOW Performed at [facility]    Report Status PENDING  Incomplete  Blood Culture (routine x 2)     Status: Abnormal (Preliminary result)   Collection Time: 05/17/16 11:46 AM  Result Value Ref Range Status   Specimen Description BLOOD LEFT ANTECUBITAL  Final   Special Requests BOTTLES DRAWN AEROBIC AND ANAEROBIC 5CC  Final   Culture  Setup Time    Final    GRAM NEGATIVE RODS IN BOTH AEROBIC AND ANAEROBIC BOTTLES Organism ID to follow CRITICAL RESULT CALLED TO, READ BACK BY AND VERIFIED WITH: I COGGIN,RN '@0514'$  05/18/16 MKELLY,MLT    Culture (A)  Final    ESCHERICHIA COLI SUSCEPTIBILITIES TO FOLLOW Performed at [facility]    Report Status PENDING  Incomplete  Blood Culture ID Panel (Reflexed)     Status: Abnormal   Collection Time: 05/17/16 11:46 AM  Result Value Ref Range Status   Enterococcus species NOT DETECTED NOT DETECTED Final   Listeria monocytogenes NOT DETECTED NOT DETECTED Final   Staphylococcus species NOT DETECTED NOT DETECTED Final   Staphylococcus aureus NOT DETECTED NOT DETECTED Final   Streptococcus species NOT DETECTED NOT DETECTED Final   Streptococcus agalactiae NOT DETECTED NOT DETECTED Final   Streptococcus pneumoniae NOT DETECTED NOT DETECTED Final   Streptococcus pyogenes NOT DETECTED NOT DETECTED Final   Acinetobacter baumannii NOT DETECTED NOT DETECTED Final   Enterobacteriaceae species DETECTED (A) NOT DETECTED Final    Comment: CRITICAL RESULT CALLED TO, READ BACK BY AND VERIFIED WITH: I COGGIN,RN '@0514'$  05/18/16 MKELLY,MLT    Enterobacter cloacae complex NOT DETECTED NOT DETECTED Final   Escherichia coli DETECTED (A) NOT DETECTED Final    Comment: CRITICAL RESULT CALLED TO, READ BACK BY AND VERIFIED WITH: I COGGIN,RN '@0514'$  05/18/16 MKELLY,MLT    Klebsiella oxytoca NOT DETECTED NOT DETECTED Final   Klebsiella pneumoniae NOT DETECTED NOT DETECTED Final   Proteus species NOT DETECTED NOT DETECTED Final   Serratia marcescens NOT DETECTED NOT DETECTED Final   Carbapenem resistance NOT DETECTED NOT DETECTED Final   Haemophilus influenzae NOT DETECTED NOT DETECTED Final   Neisseria meningitidis NOT DETECTED NOT DETECTED Final   Pseudomonas aeruginosa NOT DETECTED NOT DETECTED Final   Candida albicans NOT DETECTED NOT DETECTED Final   Candida glabrata NOT DETECTED NOT DETECTED Final    Candida krusei NOT DETECTED NOT DETECTED Final   Candida parapsilosis NOT DETECTED NOT DETECTED Final   Candida tropicalis NOT DETECTED NOT DETECTED Final    Comment: Performed at [facility]  Urine culture     Status: Abnormal   Collection Time: 05/17/16 12:42 PM  Result Value Ref Range Status   Specimen Description  URINE, CLEAN CATCH  Final   Special Requests NONE  Final   Culture >=100,000 COLONIES/mL ESCHERICHIA COLI (A)  Final   Report Status 05/19/2016 FINAL  Final   Organism ID, Bacteria ESCHERICHIA COLI (A)  Final      Susceptibility   Escherichia coli - MIC*    AMPICILLIN >=32 RESISTANT Resistant     CEFAZOLIN <=4 SENSITIVE Sensitive     CEFTRIAXONE <=1 SENSITIVE Sensitive     CIPROFLOXACIN <=0.25 SENSITIVE Sensitive     GENTAMICIN <=1 SENSITIVE Sensitive     IMIPENEM <=0.25 SENSITIVE Sensitive     NITROFURANTOIN <=16 SENSITIVE Sensitive     TRIMETH/SULFA <=20 SENSITIVE Sensitive     AMPICILLIN/SULBACTAM >=32 RESISTANT Resistant     PIP/TAZO <=4 SENSITIVE Sensitive     Extended ESBL NEGATIVE Sensitive     * >=100,000 COLONIES/mL ESCHERICHIA COLI    Radiology Studies: No results found.   Scheduled Meds: . aspirin EC  81 mg Oral Daily  . bisacodyl  10 mg Rectal Once  . cefTRIAXone (ROCEPHIN)  IV  1 g Intravenous Q24H  . enoxaparin (LOVENOX) injection  30 mg Subcutaneous Q24H  . feeding supplement (ENSURE ENLIVE)  237 mL Oral BID BM  . hydrochlorothiazide  25 mg Oral Daily  . irbesartan  150 mg Oral Daily  . multivitamin with minerals  1 tablet Oral Daily  . senna-docusate  1 tablet Oral BID  . sodium chloride flush  3 mL Intravenous Q12H  . vitamin C  1,000 mg Oral Daily   Continuous Infusions: . sodium chloride 75 mL/hr at 05/19/16 0554    LOS: 0 days   Kerney Elbe, DO [facility] Pager [phone]  If 7PM-7AM, please contact night-coverage [email or web address] Password [email or web address] 05/19/2016, 2:58 PM

## 2016-05-19 NOTE — Progress Notes (Signed)
Initial Nutrition Assessment  DOCUMENTATION CODES:   Non-severe (moderate) malnutrition in context of acute illness/injury  INTERVENTION:   -Would recommend weight be obtained. Pt reports not being weighed. -Continue Ensure Enlive po BID, each supplement provides 350 kcal and 20 grams of protein -Encourage PO intake -RD to continue to monitor  NUTRITION DIAGNOSIS:   Inadequate oral intake related to poor appetite as evidenced by per patient/family report.  GOAL:   Patient will meet greater than or equal to 90% of their needs  MONITOR:   PO intake, Supplement acceptance, Labs, Weight trends, I & O's  REASON FOR ASSESSMENT:   Consult Assessment of nutrition requirement/status  ASSESSMENT:   82 y.o. female with medical history significant of hypertension in excellent health who resides in independent living. Less than a week ago, she had several days where she felt very weak and had chills. She had an event in the last 24 hours where she was so weak, she collapsed, she did not pass out. She was evaluated by the physician at her independent living facility and referred over to the emergency room on 12/4.  Patient reports eating this morning for breakfast, PO: 75%. Pt states she has not been eating solid foods for about 1 week. Pt states she was too "sick" to eat but she was drinking water and coffee. Pt states she is willing to try the Ensure supplements, will continue order.  Per pt, UBW is 115 lb. States she does not remember being weighed. Recommend weighing patient on unit to make sure pt has not had any weight loss over this past week.  Nutrition-Focused physical exam completed. Findings are moderate fat depletion, mild-moderate muscle depletion, and no edema. Suspect some depletion is related to advanced age.  Medications: Multivitamin with minerals daily, Vitamin C tablet daily Labs reviewed: Low Na, K GFR: 25  Diet Order:  Diet Heart Room service appropriate? Yes;  Fluid consistency: Thin  Skin:  Reviewed, no issues  Last BM:  PTA  Height:   Ht Readings from Last 1 Encounters:  05/18/16 '5\' 5"'$  (1.651 m)    Weight:   Wt Readings from Last 1 Encounters:  05/18/16 115 lb (52.2 kg)    Ideal Body Weight:  56.8 kg  BMI:  Body mass index is 19.14 kg/m.  Estimated Nutritional Needs:   Kcal:  1200-1400  Protein:  60-70g  Fluid:  1.4L/day  EDUCATION NEEDS:   No education needs identified at this time  Clayton Bibles, MS, RD, LDN Pager: [phone] After Hours Pager: [phone]

## 2016-05-20 ENCOUNTER — Telehealth (HOSPITAL_BASED_OUTPATIENT_CLINIC_OR_DEPARTMENT_OTHER): Payer: Self-pay

## 2016-05-20 LAB — COMPREHENSIVE METABOLIC PANEL
ALBUMIN: 2.3 g/dL — AB (ref 3.5–5.0)
ALT: 30 U/L (ref 14–54)
ANION GAP: 9 (ref 5–15)
AST: 34 U/L (ref 15–41)
Alkaline Phosphatase: 108 U/L (ref 38–126)
BUN: 42 mg/dL — ABNORMAL HIGH (ref 6–20)
CO2: 24 mmol/L (ref 22–32)
Calcium: 8.5 mg/dL — ABNORMAL LOW (ref 8.9–10.3)
Chloride: 98 mmol/L — ABNORMAL LOW (ref 101–111)
Creatinine, Ser: 1.5 mg/dL — ABNORMAL HIGH (ref 0.44–1.00)
GFR calc Af Amer: 35 mL/min — ABNORMAL LOW (ref >60)
GFR calc non Af Amer: 31 mL/min — ABNORMAL LOW (ref >60)
GLUCOSE: 101 mg/dL — AB (ref 65–99)
POTASSIUM: 3.2 mmol/L — AB (ref 3.5–5.1)
SODIUM: 131 mmol/L — AB (ref 135–145)
Total Bilirubin: 0.7 mg/dL (ref 0.3–1.2)
Total Protein: 5.3 g/dL — ABNORMAL LOW (ref 6.5–8.1)

## 2016-05-20 LAB — CBC WITH DIFFERENTIAL/PLATELET
BASOS ABS: 0 10*3/uL (ref 0.0–0.1)
Basophils Relative: 0 %
EOS ABS: 0.3 10*3/uL (ref 0.0–0.7)
Eosinophils Relative: 2 %
HCT: 29.2 % — ABNORMAL LOW (ref 36.0–46.0)
HEMOGLOBIN: 10.2 g/dL — AB (ref 12.0–15.0)
LYMPHS PCT: 6 %
Lymphs Abs: 0.9 10*3/uL (ref 0.7–4.0)
MCH: 32 pg (ref 26.0–34.0)
MCHC: 34.9 g/dL (ref 30.0–36.0)
MCV: 91.5 fL (ref 78.0–100.0)
Monocytes Absolute: 1.6 10*3/uL — ABNORMAL HIGH (ref 0.1–1.0)
Monocytes Relative: 10 %
NEUTROS ABS: 12.7 10*3/uL — AB (ref 1.7–7.7)
Neutrophils Relative %: 82 %
Platelets: 296 10*3/uL (ref 150–400)
RBC: 3.19 MIL/uL — ABNORMAL LOW (ref 3.87–5.11)
RDW: 13.3 % (ref 11.5–15.5)
WBC: 15.5 10*3/uL — AB (ref 4.0–10.5)

## 2016-05-20 LAB — CULTURE, BLOOD (ROUTINE X 2)

## 2016-05-20 LAB — MAGNESIUM: Magnesium: 1.8 mg/dL (ref 1.7–2.4)

## 2016-05-20 LAB — PHOSPHORUS: Phosphorus: 2.3 mg/dL — ABNORMAL LOW (ref 2.5–4.6)

## 2016-05-20 MED ORDER — SENNOSIDES-DOCUSATE SODIUM 8.6-50 MG PO TABS
1.0000 | ORAL_TABLET | Freq: Every evening | ORAL | 0 refills | Status: DC | PRN
Start: 2016-05-20 — End: 2017-08-10

## 2016-05-20 MED ORDER — CEFPODOXIME PROXETIL 200 MG PO TABS: 400.0000 mg | ORAL_TABLET | Freq: Every day | ORAL | 0 refills | Status: DC

## 2016-05-20 MED ORDER — POLYETHYLENE GLYCOL 3350 17 G PO PACK: 17.0000 g | PACK | Freq: Every day | ORAL | 0 refills | Status: DC

## 2016-05-20 MED ORDER — ENSURE ENLIVE PO LIQD
237.0000 mL | Freq: Two times a day (BID) | ORAL | 12 refills | Status: DC
Start: 2016-05-20 — End: 2017-08-10

## 2016-05-20 MED ORDER — POTASSIUM CHLORIDE CRYS ER 20 MEQ PO TBCR
40.0000 meq | EXTENDED_RELEASE_TABLET | Freq: Two times a day (BID) | ORAL | Status: DC
  Administered 2016-05-20: 40 meq via ORAL
  Filled 2016-05-20: qty 2

## 2016-05-20 MED ORDER — POLYETHYLENE GLYCOL 3350 17 G PO PACK
17.0000 g | PACK | Freq: Every day | ORAL | Status: DC
  Administered 2016-05-20: 17 g via ORAL
  Filled 2016-05-20: qty 1

## 2016-05-20 NOTE — Progress Notes (Signed)
Discharge instructions discussed with patient and daughter, verbalized understanding. Denies pain. Had a bowel movement this afternoon. Patient in good spirit. Stable.

## 2016-05-20 NOTE — Discharge Summary (Signed)
Physician Discharge Summary  Peggy [name] RWE:[id number] DOB: 1928-08-27 DOA: 05/18/2016  PCP: Henrine Screws, MD  Admit date: 05/18/2016 Discharge date: 05/20/2016  Admitted From: Home Disposition: Independent Living  Recommendations for Outpatient Follow-up:  1. Follow up with PCP in 1-2 weeks 2. Please obtain BMP/CBC in 48 hours to recheck WBC and Potassium Level 3. Please follow up on the following pending results: Repeat Blood Cultures from 05/19/2016  Home Health: No Equipment/Devices: None  Discharge Condition: Stable and Improved.  CODE STATUS: FULL CODE Diet recommendation: Heart Healthy   Brief/Interim Summary: Peggy [name] a 82 y.o.femalewith medical history significant of Hypertension in excellent health who resides in independent living. Less than a week ago, she had several days where she felt very weak and had chills. She had an event in the last 24 hours where she was so weak, she collapsed, she did not pass out. She was evaluated by the physician at her independent living facility and referred over to the emergency room on 12/4. In the emergency room, patient was noted to have a white count of 18, acute kidney injury with a creatinine of 1.73 and elevated BUN and a large UTI. Patient was otherwise stable with a normal lactic acid level. She was prescribed Keflex for UTI andsent back to her independent living facility and was doing okay when she was called by the emergency room that her blood cultures came back positive for  Enterobacteriaceae Species-Escherichia coli. Patient came back to the Hospital and was treated with IV Abx and sensitives of Blood Cx Came Back. Patient steadily improved. At this time she is stable to be D/C'd Home and follow up with PCP within 1 week.   Discharge Diagnoses:  Principal Problem:   Bacteremia Active Problems:   Hypertension   AKI (acute kidney injury) ([place])   Malnutrition of moderate degree  Gram Negative  Bacteremia from E.Coli (Enterobacteriace Species) -Blood Cx + and Urine Cx 05/17/2016 showed >100,00 CFU of E Coli sensitive to Ceftriaxone -Gave patient NS at 75 mL/hr and with Acetaminophen 650 mg po q6hprn for Mild Pain or Fever >101 -C/w Vantin 400 mg po q24h for 11 more days -WBC improved from 19.7 -> 18.8 -> 15.5 -Repeat CBC, BMP as an outpatient and follow up with PCP within 1 week.  -Repeat Blood Cx Yesterday showed No Growth < 24 hours  Constipation -Started Senna-Docusate 1 tab po BID -Patient received 10 mg of Bisacodyl Suppository Rectally yesterday with small Bowel Movement -Started Miralax 17 daily for Discharge  Hypokalemia -Patient's K+ was 3.2 -Repleted with Kcl -Repeat BMP as an Outpatient  Hyponatremia -Patient's Sodium went from 127 -> 129 -> 131 -Improved with IVF Rehydration with NS at 75 mL/hr -Repeat BMP as an Outpatient -Encouraged Fluid intake  AKI 2/2 to Dehydration from UTI/Bacteremia. Suspect some CKD -Unknown Baseline however on Admission Cr has been ranging from 1.73-1.91 -BUN/Cr went from 55/1.91 -> 51/1.77 -> 42/1.50  -D/C'd HCTZ; Re-evaluate with PCP to restarted -Received IVF NS at 75 mL/hr -C/w Irbesartan 150 mg po Daily -Avoid all other Nephrotoxics and encouraged Oral Rehydration.   Discharge Instructions  Discharge Instructions    Call MD for:  difficulty breathing, headache or visual disturbances    Complete by:  As directed    Call MD for:  persistant dizziness or light-headedness    Complete by:  As directed    Call MD for:  persistant nausea and vomiting    Complete by:  As directed  Call MD for:  temperature >100.4    Complete by:  As directed    Diet - low sodium heart healthy    Complete by:  As directed    Discharge instructions    Complete by:  As directed    Follow up with PCP. Take all medications as prescribed. If symptoms change or worsen please return to the ED for Evaluation. Have bloodwork repeated as an  outpatient.   Increase activity slowly    Complete by:  As directed        Medication List    STOP taking these medications   cephALEXin 500 MG capsule Commonly known as:  KEFLEX   hydrochlorothiazide 25 MG tablet Commonly known as:  HYDRODIURIL     TAKE these medications   aspirin EC 81 MG tablet Take 81 mg by mouth daily.   CALCIUM 500 +D 500-400 MG-UNIT Tabs Generic drug:  Calcium Carb-Cholecalciferol Take 1 tablet by mouth daily.   cefpodoxime 200 MG tablet Commonly known as:  VANTIN Take 2 tablets (400 mg total) by mouth daily.   feeding supplement (ENSURE ENLIVE) Liqd Take 237 mLs by mouth 2 (two) times daily between meals.   multivitamin with minerals Tabs tablet Take 1 tablet by mouth daily.   omega-3 acid ethyl esters 1 g capsule Commonly known as:  LOVAZA Take 1 g by mouth daily.   polyethylene glycol packet Commonly known as:  MIRALAX / GLYCOLAX Take 17 g by mouth daily.   senna-docusate 8.6-50 MG tablet Commonly known as:  Senokot-S Take 1 tablet by mouth at bedtime as needed for mild constipation.   valsartan 160 MG tablet Commonly known as:  DIOVAN Take 160 mg by mouth daily.   vitamin C 1000 MG tablet Take 1,000 mg by mouth daily.      Follow-up Information    GATES,ROBERT NEVILL, MD Follow up in 1 week(s).   Specialty:  Internal Medicine Why:  Please Call to make an appointment.  Contact information: [address] [place] [address] [phone]          No Known Allergies  Consultations:  Infectious Diseases Dr. Linus Salmons via Telephone Conversation  Procedures/Studies: Dg Chest 2 View  Result Date: 05/17/2016 CLINICAL DATA:  Sepsis EXAM: CHEST  2 VIEW COMPARISON:  None. FINDINGS: Cardiomegaly and aortic tortuosity. There is no edema, consolidation, or pneumothorax. Posterior costophrenic sulcus blunting on the left which could be trace fluid or scarring. Exaggerated thoracic kyphosis hand prominent lumbar  levoscoliosis. No acute osseous finding. IMPRESSION: No evidence for pneumonia. Electronically Signed   By: Monte Fantasia M.D.   On: 05/17/2016 12:35    Subjective: Seen and examined at bedside and was doing well. Stated she had a very small Bowel movement with the suppository but feeling good over all. No N/V/Abdominal Pain and no CP.  Discharge Exam: Vitals:   05/19/16 2158 05/20/16 0530  BP: (!) 152/86 (!) 151/77  Pulse: 79 77  Resp: 16 16  Temp: 98 F (36.7 C) 98.2 F (36.8 C)   Vitals:   05/19/16 0520 05/19/16 1424 05/19/16 2158 05/20/16 0530  BP: 138/77 (!) 147/83 (!) 152/86 (!) 151/77  Pulse: 83 73 79 77  Resp: '16 17 16 16  '$ Temp: 99.7 F (37.6 C) 98.8 F (37.1 C) 98 F (36.7 C) 98.2 F (36.8 C)  TempSrc: Oral Oral Oral Oral  SpO2: 95% 96% 99% 96%  Weight:      Height:  General: Pt is alert, awake, not in acute distress Cardiovascular: RRR, S1/S2 +, no rubs, no gallops Respiratory: CTA bilaterally, no wheezing, no rhonchi Abdominal: Soft, NT, ND, bowel sounds + Extremities: no edema, no cyanosis  The results of significant diagnostics from this hospitalization (including imaging, microbiology, ancillary and laboratory) are listed below for reference.    Microbiology: Recent Results (from the past 240 hour(s))  Blood Culture (routine x 2)     Status: Abnormal   Collection Time: 05/17/16 11:42 AM  Result Value Ref Range Status   Specimen Description BLOOD RIGHT ANTECUBITAL  Final   Special Requests BOTTLES DRAWN AEROBIC AND ANAEROBIC 5CC  Final   Culture  Setup Time   Final    GRAM NEGATIVE RODS IN BOTH AEROBIC AND ANAEROBIC BOTTLES CRITICAL VALUE NOTED.  VALUE IS CONSISTENT WITH PREVIOUSLY REPORTED AND CALLED VALUE.    Culture (A)  Final    ESCHERICHIA COLI SUSCEPTIBILITIES PERFORMED ON PREVIOUS CULTURE WITHIN THE LAST 5 DAYS. Performed at [facility]    Report Status 05/20/2016 FINAL  Final  Blood Culture (routine x 2)     Status: Abnormal    Collection Time: 05/17/16 11:46 AM  Result Value Ref Range Status   Specimen Description BLOOD LEFT ANTECUBITAL  Final   Special Requests BOTTLES DRAWN AEROBIC AND ANAEROBIC 5CC  Final   Culture  Setup Time   Final    GRAM NEGATIVE RODS IN BOTH AEROBIC AND ANAEROBIC BOTTLES Organism ID to follow CRITICAL RESULT CALLED TO, READ BACK BY AND VERIFIED WITH: I COGGIN,RN '@0514'$  05/18/16 MKELLY,MLT Performed at [facility] (A)  Final   Report Status 05/20/2016 FINAL  Final   Organism ID, Bacteria ESCHERICHIA COLI  Final      Susceptibility   Escherichia coli - MIC*    AMPICILLIN >=32 RESISTANT Resistant     CEFAZOLIN <=4 SENSITIVE Sensitive     CEFEPIME <=1 SENSITIVE Sensitive     CEFTAZIDIME <=1 SENSITIVE Sensitive     CEFTRIAXONE <=1 SENSITIVE Sensitive     CIPROFLOXACIN <=0.25 SENSITIVE Sensitive     GENTAMICIN <=1 SENSITIVE Sensitive     IMIPENEM <=0.25 SENSITIVE Sensitive     TRIMETH/SULFA <=20 SENSITIVE Sensitive     AMPICILLIN/SULBACTAM >=32 RESISTANT Resistant     PIP/TAZO <=4 SENSITIVE Sensitive     Extended ESBL NEGATIVE Sensitive     * ESCHERICHIA COLI  Blood Culture ID Panel (Reflexed)     Status: Abnormal   Collection Time: 05/17/16 11:46 AM  Result Value Ref Range Status   Enterococcus species NOT DETECTED NOT DETECTED Final   Listeria monocytogenes NOT DETECTED NOT DETECTED Final   Staphylococcus species NOT DETECTED NOT DETECTED Final   Staphylococcus aureus NOT DETECTED NOT DETECTED Final   Streptococcus species NOT DETECTED NOT DETECTED Final   Streptococcus agalactiae NOT DETECTED NOT DETECTED Final   Streptococcus pneumoniae NOT DETECTED NOT DETECTED Final   Streptococcus pyogenes NOT DETECTED NOT DETECTED Final   Acinetobacter baumannii NOT DETECTED NOT DETECTED Final   Enterobacteriaceae species DETECTED (A) NOT DETECTED Final    Comment: CRITICAL RESULT CALLED TO, READ BACK BY AND VERIFIED WITH: I COGGIN,RN '@0514'$   05/18/16 MKELLY,MLT    Enterobacter cloacae complex NOT DETECTED NOT DETECTED Final   Escherichia coli DETECTED (A) NOT DETECTED Final    Comment: CRITICAL RESULT CALLED TO, READ BACK BY AND VERIFIED WITH: I COGGIN,RN '@0514'$  05/18/16 MKELLY,MLT    Klebsiella oxytoca NOT DETECTED NOT DETECTED Final  Klebsiella pneumoniae NOT DETECTED NOT DETECTED Final   Proteus species NOT DETECTED NOT DETECTED Final   Serratia marcescens NOT DETECTED NOT DETECTED Final   Carbapenem resistance NOT DETECTED NOT DETECTED Final   Haemophilus influenzae NOT DETECTED NOT DETECTED Final   Neisseria meningitidis NOT DETECTED NOT DETECTED Final   Pseudomonas aeruginosa NOT DETECTED NOT DETECTED Final   Candida albicans NOT DETECTED NOT DETECTED Final   Candida glabrata NOT DETECTED NOT DETECTED Final   Candida krusei NOT DETECTED NOT DETECTED Final   Candida parapsilosis NOT DETECTED NOT DETECTED Final   Candida tropicalis NOT DETECTED NOT DETECTED Final    Comment: Performed at [facility]  Urine culture     Status: Abnormal   Collection Time: 05/17/16 12:42 PM  Result Value Ref Range Status   Specimen Description URINE, CLEAN CATCH  Final   Special Requests NONE  Final   Culture >=100,000 COLONIES/mL ESCHERICHIA COLI (A)  Final   Report Status 05/19/2016 FINAL  Final   Organism ID, Bacteria ESCHERICHIA COLI (A)  Final      Susceptibility   Escherichia coli - MIC*    AMPICILLIN >=32 RESISTANT Resistant     CEFAZOLIN <=4 SENSITIVE Sensitive     CEFTRIAXONE <=1 SENSITIVE Sensitive     CIPROFLOXACIN <=0.25 SENSITIVE Sensitive     GENTAMICIN <=1 SENSITIVE Sensitive     IMIPENEM <=0.25 SENSITIVE Sensitive     NITROFURANTOIN <=16 SENSITIVE Sensitive     TRIMETH/SULFA <=20 SENSITIVE Sensitive     AMPICILLIN/SULBACTAM >=32 RESISTANT Resistant     PIP/TAZO <=4 SENSITIVE Sensitive     Extended ESBL NEGATIVE Sensitive     * >=100,000 COLONIES/mL ESCHERICHIA COLI     Labs: BNP (last 3 results) No  results for input(s): BNP in the last 8760 hours. Basic Metabolic Panel:  Recent Labs Lab 05/17/16 1142 05/18/16 1756 05/18/16 2002 05/19/16 0354 05/20/16 0336  NA 128* 128* 127* 129* 131*  K 3.5 3.3* 3.4* 3.4* 3.2*  CL 91* 91* 93* 95* 98*  CO2 26  --  '25 25 24  '$ GLUCOSE 143* 117* 121* 106* 101*  BUN 43* 44* 55* 51* 42*  CREATININE 1.73* 1.90* 1.89*  1.91* 1.77* 1.50*  CALCIUM 9.3  --  9.0 8.8* 8.5*  MG  --   --   --   --  1.8  PHOS  --   --   --   --  2.3*   Liver Function Tests:  Recent Labs Lab 05/17/16 1142 05/20/16 0336  AST 37 34  ALT 30 30  ALKPHOS 85 108  BILITOT 0.9 0.7  PROT 6.7 5.3*  ALBUMIN 3.3* 2.3*   No results for input(s): LIPASE, AMYLASE in the last 168 hours. No results for input(s): AMMONIA in the last 168 hours. CBC:  Recent Labs Lab 05/17/16 1142 05/18/16 1745 05/18/16 1756 05/18/16 2002 05/19/16 0354 05/20/16 0336  WBC 18.3* 19.9*  --  19.7* 18.8* 15.5*  NEUTROABS 16.4* 18.2*  --   --   --  12.7*  HGB 11.1* 12.1 12.9 10.9* 10.8* 10.2*  HCT 32.1* 34.1* 38.0 31.0* 30.9* 29.2*  MCV 95.0 92.2  --  91.4 92.8 91.5  PLT 197 253  --  244 273 296   Cardiac Enzymes: No results for input(s): CKTOTAL, CKMB, CKMBINDEX, TROPONINI in the last 168 hours. BNP: Invalid input(s): POCBNP CBG: No results for input(s): GLUCAP in the last 168 hours. D-Dimer No results for input(s): DDIMER in the last 72 hours. Hgb A1c  No results for input(s): HGBA1C in the last 72 hours. Lipid Profile No results for input(s): CHOL, HDL, LDLCALC, TRIG, CHOLHDL, LDLDIRECT in the last 72 hours. Thyroid function studies No results for input(s): TSH, T4TOTAL, T3FREE, THYROIDAB in the last 72 hours.  Invalid input(s): FREET3 Anemia work up No results for input(s): VITAMINB12, FOLATE, FERRITIN, TIBC, IRON, RETICCTPCT in the last 72 hours. Urinalysis    Component Value Date/Time   COLORURINE YELLOW 05/17/2016 1242   APPEARANCEUR CLOUDY (A) 05/17/2016 1242   LABSPEC  1.015 05/17/2016 1242   PHURINE 6.0 05/17/2016 1242   GLUCOSEU NEGATIVE 05/17/2016 1242   HGBUR MODERATE (A) 05/17/2016 1242   BILIRUBINUR NEGATIVE 05/17/2016 1242   KETONESUR NEGATIVE 05/17/2016 1242   PROTEINUR >300 (A) 05/17/2016 1242   NITRITE NEGATIVE 05/17/2016 1242   LEUKOCYTESUR LARGE (A) 05/17/2016 1242   Sepsis Labs Invalid input(s): PROCALCITONIN,  WBC,  LACTICIDVEN Microbiology Recent Results (from the past 240 hour(s))  Blood Culture (routine x 2)     Status: Abnormal   Collection Time: 05/17/16 11:42 AM  Result Value Ref Range Status   Specimen Description BLOOD RIGHT ANTECUBITAL  Final   Special Requests BOTTLES DRAWN AEROBIC AND ANAEROBIC 5CC  Final   Culture  Setup Time   Final    GRAM NEGATIVE RODS IN BOTH AEROBIC AND ANAEROBIC BOTTLES CRITICAL VALUE NOTED.  VALUE IS CONSISTENT WITH PREVIOUSLY REPORTED AND CALLED VALUE.    Culture (A)  Final    ESCHERICHIA COLI SUSCEPTIBILITIES PERFORMED ON PREVIOUS CULTURE WITHIN THE LAST 5 DAYS. Performed at [facility]    Report Status 05/20/2016 FINAL  Final  Blood Culture (routine x 2)     Status: Abnormal   Collection Time: 05/17/16 11:46 AM  Result Value Ref Range Status   Specimen Description BLOOD LEFT ANTECUBITAL  Final   Special Requests BOTTLES DRAWN AEROBIC AND ANAEROBIC 5CC  Final   Culture  Setup Time   Final    GRAM NEGATIVE RODS IN BOTH AEROBIC AND ANAEROBIC BOTTLES Organism ID to follow CRITICAL RESULT CALLED TO, READ BACK BY AND VERIFIED WITH: I COGGIN,RN '@0514'$  05/18/16 MKELLY,MLT Performed at [facility] (A)  Final   Report Status 05/20/2016 FINAL  Final   Organism ID, Bacteria ESCHERICHIA COLI  Final      Susceptibility   Escherichia coli - MIC*    AMPICILLIN >=32 RESISTANT Resistant     CEFAZOLIN <=4 SENSITIVE Sensitive     CEFEPIME <=1 SENSITIVE Sensitive     CEFTAZIDIME <=1 SENSITIVE Sensitive     CEFTRIAXONE <=1 SENSITIVE Sensitive      CIPROFLOXACIN <=0.25 SENSITIVE Sensitive     GENTAMICIN <=1 SENSITIVE Sensitive     IMIPENEM <=0.25 SENSITIVE Sensitive     TRIMETH/SULFA <=20 SENSITIVE Sensitive     AMPICILLIN/SULBACTAM >=32 RESISTANT Resistant     PIP/TAZO <=4 SENSITIVE Sensitive     Extended ESBL NEGATIVE Sensitive     * ESCHERICHIA COLI  Blood Culture ID Panel (Reflexed)     Status: Abnormal   Collection Time: 05/17/16 11:46 AM  Result Value Ref Range Status   Enterococcus species NOT DETECTED NOT DETECTED Final   Listeria monocytogenes NOT DETECTED NOT DETECTED Final   Staphylococcus species NOT DETECTED NOT DETECTED Final   Staphylococcus aureus NOT DETECTED NOT DETECTED Final   Streptococcus species NOT DETECTED NOT DETECTED Final   Streptococcus agalactiae NOT DETECTED NOT DETECTED Final   Streptococcus pneumoniae NOT DETECTED NOT DETECTED Final  Streptococcus pyogenes NOT DETECTED NOT DETECTED Final   Acinetobacter baumannii NOT DETECTED NOT DETECTED Final   Enterobacteriaceae species DETECTED (A) NOT DETECTED Final    Comment: CRITICAL RESULT CALLED TO, READ BACK BY AND VERIFIED WITH: I COGGIN,RN '@0514'$  05/18/16 MKELLY,MLT    Enterobacter cloacae complex NOT DETECTED NOT DETECTED Final   Escherichia coli DETECTED (A) NOT DETECTED Final    Comment: CRITICAL RESULT CALLED TO, READ BACK BY AND VERIFIED WITH: I COGGIN,RN '@0514'$  05/18/16 MKELLY,MLT    Klebsiella oxytoca NOT DETECTED NOT DETECTED Final   Klebsiella pneumoniae NOT DETECTED NOT DETECTED Final   Proteus species NOT DETECTED NOT DETECTED Final   Serratia marcescens NOT DETECTED NOT DETECTED Final   Carbapenem resistance NOT DETECTED NOT DETECTED Final   Haemophilus influenzae NOT DETECTED NOT DETECTED Final   Neisseria meningitidis NOT DETECTED NOT DETECTED Final   Pseudomonas aeruginosa NOT DETECTED NOT DETECTED Final   Candida albicans NOT DETECTED NOT DETECTED Final   Candida glabrata NOT DETECTED NOT DETECTED Final   Candida krusei NOT  DETECTED NOT DETECTED Final   Candida parapsilosis NOT DETECTED NOT DETECTED Final   Candida tropicalis NOT DETECTED NOT DETECTED Final    Comment: Performed at [facility]  Urine culture     Status: Abnormal   Collection Time: 05/17/16 12:42 PM  Result Value Ref Range Status   Specimen Description URINE, CLEAN CATCH  Final   Special Requests NONE  Final   Culture >=100,000 COLONIES/mL ESCHERICHIA COLI (A)  Final   Report Status 05/19/2016 FINAL  Final   Organism ID, Bacteria ESCHERICHIA COLI (A)  Final      Susceptibility   Escherichia coli - MIC*    AMPICILLIN >=32 RESISTANT Resistant     CEFAZOLIN <=4 SENSITIVE Sensitive     CEFTRIAXONE <=1 SENSITIVE Sensitive     CIPROFLOXACIN <=0.25 SENSITIVE Sensitive     GENTAMICIN <=1 SENSITIVE Sensitive     IMIPENEM <=0.25 SENSITIVE Sensitive     NITROFURANTOIN <=16 SENSITIVE Sensitive     TRIMETH/SULFA <=20 SENSITIVE Sensitive     AMPICILLIN/SULBACTAM >=32 RESISTANT Resistant     PIP/TAZO <=4 SENSITIVE Sensitive     Extended ESBL NEGATIVE Sensitive     * >=100,000 COLONIES/mL ESCHERICHIA COLI   Time coordinating discharge: Over 30 minutes  SIGNED:  Kerney Elbe, DO [facility] 05/20/2016, 2:10 PM Pager [phone]  If 7PM-7AM, please contact night-coverage [email or web address] Password [email or web address]

## 2016-05-20 NOTE — Telephone Encounter (Signed)
Post ED Visit - Positive Culture Follow-up  Culture report reviewed by antimicrobial stewardship pharmacist:  '[]'$  Elenor Quinones, Pharm.D. '[]'$  Heide Guile, Pharm.D., BCPS '[]'$  Parks Neptune, Pharm.D. '[x]'$  Alycia Rossetti, Pharm.D., BCPS '[]'$  [place], Pharm.D., BCPS, AAHIVP '[]'$  Legrand Como, Pharm.D., BCPS, AAHIVP '[]'$  Milus Glazier, Pharm.D. '[]'$  Stephens November, [place].D.  Positive urine culture, >/= 100,000 colonies -> E Coli Treated with Cephalexin, organism sensitive to the same and no further patient follow-up is required at this time.  Dortha Kern 05/20/2016, 1:12 PM

## 2016-05-21 ENCOUNTER — Telehealth: Payer: Self-pay

## 2016-05-21 NOTE — Telephone Encounter (Signed)
Pt current adm at [facility] with + [facility]  MD aware

## 2016-05-24 LAB — CULTURE, BLOOD (ROUTINE X 2)
CULTURE: NO GROWTH
Culture: NO GROWTH

## 2016-05-26 DIAGNOSIS — N390 Urinary tract infection, site not specified: Secondary | ICD-10-CM | POA: Diagnosis not present

## 2016-05-26 DIAGNOSIS — R7881 Bacteremia: Secondary | ICD-10-CM | POA: Diagnosis not present

## 2016-05-26 DIAGNOSIS — E46 Unspecified protein-calorie malnutrition: Secondary | ICD-10-CM | POA: Diagnosis not present

## 2016-05-26 DIAGNOSIS — I10 Essential (primary) hypertension: Secondary | ICD-10-CM | POA: Diagnosis not present

## 2016-05-27 DIAGNOSIS — R32 Unspecified urinary incontinence: Secondary | ICD-10-CM | POA: Diagnosis not present

## 2016-05-27 DIAGNOSIS — M6281 Muscle weakness (generalized): Secondary | ICD-10-CM | POA: Diagnosis not present

## 2016-05-31 DIAGNOSIS — M6281 Muscle weakness (generalized): Secondary | ICD-10-CM | POA: Diagnosis not present

## 2016-05-31 DIAGNOSIS — R32 Unspecified urinary incontinence: Secondary | ICD-10-CM | POA: Diagnosis not present

## 2016-06-02 DIAGNOSIS — R32 Unspecified urinary incontinence: Secondary | ICD-10-CM | POA: Diagnosis not present

## 2016-06-02 DIAGNOSIS — M6281 Muscle weakness (generalized): Secondary | ICD-10-CM | POA: Diagnosis not present

## 2016-06-08 DIAGNOSIS — R6889 Other general symptoms and signs: Secondary | ICD-10-CM | POA: Diagnosis not present

## 2016-06-08 DIAGNOSIS — R509 Fever, unspecified: Secondary | ICD-10-CM | POA: Diagnosis not present

## 2016-06-10 DIAGNOSIS — J029 Acute pharyngitis, unspecified: Secondary | ICD-10-CM | POA: Diagnosis not present

## 2016-06-14 DIAGNOSIS — M6281 Muscle weakness (generalized): Secondary | ICD-10-CM | POA: Diagnosis not present

## 2016-06-14 DIAGNOSIS — K52832 Lymphocytic colitis: Secondary | ICD-10-CM | POA: Diagnosis not present

## 2016-06-14 DIAGNOSIS — H02409 Unspecified ptosis of unspecified eyelid: Secondary | ICD-10-CM | POA: Diagnosis not present

## 2016-06-14 DIAGNOSIS — M8580 Other specified disorders of bone density and structure, unspecified site: Secondary | ICD-10-CM | POA: Diagnosis not present

## 2016-06-14 DIAGNOSIS — M25559 Pain in unspecified hip: Secondary | ICD-10-CM | POA: Diagnosis not present

## 2016-06-14 DIAGNOSIS — M79643 Pain in unspecified hand: Secondary | ICD-10-CM | POA: Diagnosis not present

## 2016-06-14 DIAGNOSIS — S46019A Strain of muscle(s) and tendon(s) of the rotator cuff of unspecified shoulder, initial encounter: Secondary | ICD-10-CM | POA: Diagnosis not present

## 2016-06-14 DIAGNOSIS — I10 Essential (primary) hypertension: Secondary | ICD-10-CM | POA: Diagnosis not present

## 2016-06-14 DIAGNOSIS — M461 Sacroiliitis, not elsewhere classified: Secondary | ICD-10-CM | POA: Diagnosis not present

## 2016-06-14 DIAGNOSIS — R32 Unspecified urinary incontinence: Secondary | ICD-10-CM | POA: Diagnosis not present

## 2016-06-18 DIAGNOSIS — I10 Essential (primary) hypertension: Secondary | ICD-10-CM | POA: Diagnosis not present

## 2016-06-18 DIAGNOSIS — S46019A Strain of muscle(s) and tendon(s) of the rotator cuff of unspecified shoulder, initial encounter: Secondary | ICD-10-CM | POA: Diagnosis not present

## 2016-06-18 DIAGNOSIS — K52832 Lymphocytic colitis: Secondary | ICD-10-CM | POA: Diagnosis not present

## 2016-06-18 DIAGNOSIS — M461 Sacroiliitis, not elsewhere classified: Secondary | ICD-10-CM | POA: Diagnosis not present

## 2016-06-18 DIAGNOSIS — R32 Unspecified urinary incontinence: Secondary | ICD-10-CM | POA: Diagnosis not present

## 2016-06-18 DIAGNOSIS — M6281 Muscle weakness (generalized): Secondary | ICD-10-CM | POA: Diagnosis not present

## 2016-06-21 DIAGNOSIS — S46019A Strain of muscle(s) and tendon(s) of the rotator cuff of unspecified shoulder, initial encounter: Secondary | ICD-10-CM | POA: Diagnosis not present

## 2016-06-21 DIAGNOSIS — M6281 Muscle weakness (generalized): Secondary | ICD-10-CM | POA: Diagnosis not present

## 2016-06-21 DIAGNOSIS — M461 Sacroiliitis, not elsewhere classified: Secondary | ICD-10-CM | POA: Diagnosis not present

## 2016-06-21 DIAGNOSIS — R32 Unspecified urinary incontinence: Secondary | ICD-10-CM | POA: Diagnosis not present

## 2016-06-21 DIAGNOSIS — I10 Essential (primary) hypertension: Secondary | ICD-10-CM | POA: Diagnosis not present

## 2016-06-21 DIAGNOSIS — K52832 Lymphocytic colitis: Secondary | ICD-10-CM | POA: Diagnosis not present

## 2016-06-22 DIAGNOSIS — S46019A Strain of muscle(s) and tendon(s) of the rotator cuff of unspecified shoulder, initial encounter: Secondary | ICD-10-CM | POA: Diagnosis not present

## 2016-06-22 DIAGNOSIS — R32 Unspecified urinary incontinence: Secondary | ICD-10-CM | POA: Diagnosis not present

## 2016-06-22 DIAGNOSIS — K52832 Lymphocytic colitis: Secondary | ICD-10-CM | POA: Diagnosis not present

## 2016-06-22 DIAGNOSIS — M6281 Muscle weakness (generalized): Secondary | ICD-10-CM | POA: Diagnosis not present

## 2016-06-22 DIAGNOSIS — M461 Sacroiliitis, not elsewhere classified: Secondary | ICD-10-CM | POA: Diagnosis not present

## 2016-06-22 DIAGNOSIS — I10 Essential (primary) hypertension: Secondary | ICD-10-CM | POA: Diagnosis not present

## 2016-08-27 DIAGNOSIS — H02831 Dermatochalasis of right upper eyelid: Secondary | ICD-10-CM | POA: Diagnosis not present

## 2016-08-27 DIAGNOSIS — G245 Blepharospasm: Secondary | ICD-10-CM | POA: Diagnosis not present

## 2016-08-27 DIAGNOSIS — H01022 Squamous blepharitis right lower eyelid: Secondary | ICD-10-CM | POA: Diagnosis not present

## 2016-08-27 DIAGNOSIS — H02054 Trichiasis without entropian left upper eyelid: Secondary | ICD-10-CM | POA: Diagnosis not present

## 2016-08-27 DIAGNOSIS — H01021 Squamous blepharitis right upper eyelid: Secondary | ICD-10-CM | POA: Diagnosis not present

## 2016-08-27 DIAGNOSIS — H02834 Dermatochalasis of left upper eyelid: Secondary | ICD-10-CM | POA: Diagnosis not present

## 2016-08-27 DIAGNOSIS — Z961 Presence of intraocular lens: Secondary | ICD-10-CM | POA: Diagnosis not present

## 2016-08-27 DIAGNOSIS — H01025 Squamous blepharitis left lower eyelid: Secondary | ICD-10-CM | POA: Diagnosis not present

## 2016-08-27 DIAGNOSIS — H01024 Squamous blepharitis left upper eyelid: Secondary | ICD-10-CM | POA: Diagnosis not present

## 2016-08-31 DIAGNOSIS — S81811A Laceration without foreign body, right lower leg, initial encounter: Secondary | ICD-10-CM | POA: Diagnosis not present

## 2016-10-04 DIAGNOSIS — D2239 Melanocytic nevi of other parts of face: Secondary | ICD-10-CM | POA: Diagnosis not present

## 2016-10-04 DIAGNOSIS — G245 Blepharospasm: Secondary | ICD-10-CM | POA: Diagnosis not present

## 2016-10-05 DIAGNOSIS — K219 Gastro-esophageal reflux disease without esophagitis: Secondary | ICD-10-CM | POA: Diagnosis not present

## 2016-10-05 DIAGNOSIS — M40209 Unspecified kyphosis, site unspecified: Secondary | ICD-10-CM | POA: Diagnosis not present

## 2016-10-05 DIAGNOSIS — I10 Essential (primary) hypertension: Secondary | ICD-10-CM | POA: Diagnosis not present

## 2016-10-05 DIAGNOSIS — Z79899 Other long term (current) drug therapy: Secondary | ICD-10-CM | POA: Diagnosis not present

## 2016-10-05 DIAGNOSIS — M859 Disorder of bone density and structure, unspecified: Secondary | ICD-10-CM | POA: Diagnosis not present

## 2016-10-05 DIAGNOSIS — E559 Vitamin D deficiency, unspecified: Secondary | ICD-10-CM | POA: Diagnosis not present

## 2016-10-05 DIAGNOSIS — K589 Irritable bowel syndrome without diarrhea: Secondary | ICD-10-CM | POA: Diagnosis not present

## 2016-10-05 DIAGNOSIS — Z0001 Encounter for general adult medical examination with abnormal findings: Secondary | ICD-10-CM | POA: Diagnosis not present

## 2016-10-05 DIAGNOSIS — Z1389 Encounter for screening for other disorder: Secondary | ICD-10-CM | POA: Diagnosis not present

## 2016-10-05 LAB — TSH: TSH: 1.59 (ref 0.41–5.90)

## 2016-10-05 LAB — LIPID PANEL
CHOLESTEROL: 164 (ref 0–200)
HDL: 63 (ref 35–70)
LDL CALC: 87
Triglycerides: 71 (ref 40–160)

## 2016-10-05 LAB — BASIC METABOLIC PANEL
BUN: 29 — AB (ref 4–21)
Creatinine: 1 (ref 0.5–1.1)
GLUCOSE: 88
POTASSIUM: 4.3 (ref 3.4–5.3)
SODIUM: 138 (ref 137–147)

## 2016-10-05 LAB — CBC AND DIFFERENTIAL
HCT: 35 — AB (ref 36–46)
Hemoglobin: 11.8 — AB (ref 12.0–16.0)
PLATELETS: 223 (ref 150–399)
WBC: 5.3

## 2016-10-05 LAB — HEPATIC FUNCTION PANEL
ALT: 13 (ref 7–35)
AST: 20 (ref 13–35)

## 2016-10-05 LAB — VITAMIN D 25 HYDROXY (VIT D DEFICIENCY, FRACTURES): VIT D 25 HYDROXY: 66.7

## 2016-10-28 DIAGNOSIS — G245 Blepharospasm: Secondary | ICD-10-CM | POA: Diagnosis not present

## 2016-10-28 DIAGNOSIS — D2239 Melanocytic nevi of other parts of face: Secondary | ICD-10-CM | POA: Diagnosis not present

## 2016-11-10 DIAGNOSIS — H01025 Squamous blepharitis left lower eyelid: Secondary | ICD-10-CM | POA: Diagnosis not present

## 2016-11-10 DIAGNOSIS — G245 Blepharospasm: Secondary | ICD-10-CM | POA: Diagnosis not present

## 2016-11-10 DIAGNOSIS — H02831 Dermatochalasis of right upper eyelid: Secondary | ICD-10-CM | POA: Diagnosis not present

## 2016-11-10 DIAGNOSIS — Z961 Presence of intraocular lens: Secondary | ICD-10-CM | POA: Diagnosis not present

## 2016-11-10 DIAGNOSIS — H01021 Squamous blepharitis right upper eyelid: Secondary | ICD-10-CM | POA: Diagnosis not present

## 2016-11-10 DIAGNOSIS — H02834 Dermatochalasis of left upper eyelid: Secondary | ICD-10-CM | POA: Diagnosis not present

## 2016-11-10 DIAGNOSIS — H02054 Trichiasis without entropian left upper eyelid: Secondary | ICD-10-CM | POA: Diagnosis not present

## 2016-11-10 DIAGNOSIS — H01024 Squamous blepharitis left upper eyelid: Secondary | ICD-10-CM | POA: Diagnosis not present

## 2016-11-10 DIAGNOSIS — H01022 Squamous blepharitis right lower eyelid: Secondary | ICD-10-CM | POA: Diagnosis not present

## 2016-12-04 DIAGNOSIS — L97328 Non-pressure chronic ulcer of left ankle with other specified severity: Secondary | ICD-10-CM | POA: Diagnosis not present

## 2016-12-04 DIAGNOSIS — I87322 Chronic venous hypertension (idiopathic) with inflammation of left lower extremity: Secondary | ICD-10-CM | POA: Diagnosis not present

## 2017-01-03 DIAGNOSIS — G245 Blepharospasm: Secondary | ICD-10-CM | POA: Diagnosis not present

## 2017-01-03 DIAGNOSIS — H02054 Trichiasis without entropian left upper eyelid: Secondary | ICD-10-CM | POA: Diagnosis not present

## 2017-01-19 DIAGNOSIS — R58 Hemorrhage, not elsewhere classified: Secondary | ICD-10-CM | POA: Diagnosis not present

## 2017-01-19 DIAGNOSIS — I87322 Chronic venous hypertension (idiopathic) with inflammation of left lower extremity: Secondary | ICD-10-CM | POA: Diagnosis not present

## 2017-01-26 DIAGNOSIS — T148XXA Other injury of unspecified body region, initial encounter: Secondary | ICD-10-CM | POA: Diagnosis not present

## 2017-03-04 DIAGNOSIS — H02834 Dermatochalasis of left upper eyelid: Secondary | ICD-10-CM | POA: Diagnosis not present

## 2017-03-04 DIAGNOSIS — Z961 Presence of intraocular lens: Secondary | ICD-10-CM | POA: Diagnosis not present

## 2017-03-04 DIAGNOSIS — G245 Blepharospasm: Secondary | ICD-10-CM | POA: Diagnosis not present

## 2017-03-04 DIAGNOSIS — H01022 Squamous blepharitis right lower eyelid: Secondary | ICD-10-CM | POA: Diagnosis not present

## 2017-03-04 DIAGNOSIS — H01021 Squamous blepharitis right upper eyelid: Secondary | ICD-10-CM | POA: Diagnosis not present

## 2017-03-04 DIAGNOSIS — H01025 Squamous blepharitis left lower eyelid: Secondary | ICD-10-CM | POA: Diagnosis not present

## 2017-03-04 DIAGNOSIS — H02831 Dermatochalasis of right upper eyelid: Secondary | ICD-10-CM | POA: Diagnosis not present

## 2017-03-04 DIAGNOSIS — H01024 Squamous blepharitis left upper eyelid: Secondary | ICD-10-CM | POA: Diagnosis not present

## 2017-03-04 DIAGNOSIS — H02054 Trichiasis without entropian left upper eyelid: Secondary | ICD-10-CM | POA: Diagnosis not present

## 2017-03-23 DIAGNOSIS — H57813 Brow ptosis, bilateral: Secondary | ICD-10-CM | POA: Diagnosis not present

## 2017-03-23 DIAGNOSIS — G245 Blepharospasm: Secondary | ICD-10-CM | POA: Diagnosis not present

## 2017-03-23 DIAGNOSIS — Z23 Encounter for immunization: Secondary | ICD-10-CM | POA: Diagnosis not present

## 2017-04-08 DIAGNOSIS — Z961 Presence of intraocular lens: Secondary | ICD-10-CM | POA: Diagnosis not present

## 2017-04-08 DIAGNOSIS — H01021 Squamous blepharitis right upper eyelid: Secondary | ICD-10-CM | POA: Diagnosis not present

## 2017-04-08 DIAGNOSIS — H01024 Squamous blepharitis left upper eyelid: Secondary | ICD-10-CM | POA: Diagnosis not present

## 2017-04-08 DIAGNOSIS — H01022 Squamous blepharitis right lower eyelid: Secondary | ICD-10-CM | POA: Diagnosis not present

## 2017-04-08 DIAGNOSIS — H02054 Trichiasis without entropian left upper eyelid: Secondary | ICD-10-CM | POA: Diagnosis not present

## 2017-04-08 DIAGNOSIS — H02834 Dermatochalasis of left upper eyelid: Secondary | ICD-10-CM | POA: Diagnosis not present

## 2017-04-08 DIAGNOSIS — G245 Blepharospasm: Secondary | ICD-10-CM | POA: Diagnosis not present

## 2017-04-08 DIAGNOSIS — H02831 Dermatochalasis of right upper eyelid: Secondary | ICD-10-CM | POA: Diagnosis not present

## 2017-04-08 DIAGNOSIS — H01025 Squamous blepharitis left lower eyelid: Secondary | ICD-10-CM | POA: Diagnosis not present

## 2017-07-11 DIAGNOSIS — H01021 Squamous blepharitis right upper eyelid: Secondary | ICD-10-CM | POA: Diagnosis not present

## 2017-07-11 DIAGNOSIS — Z961 Presence of intraocular lens: Secondary | ICD-10-CM | POA: Diagnosis not present

## 2017-07-11 DIAGNOSIS — G245 Blepharospasm: Secondary | ICD-10-CM | POA: Diagnosis not present

## 2017-07-11 DIAGNOSIS — H02831 Dermatochalasis of right upper eyelid: Secondary | ICD-10-CM | POA: Diagnosis not present

## 2017-07-11 DIAGNOSIS — H01025 Squamous blepharitis left lower eyelid: Secondary | ICD-10-CM | POA: Diagnosis not present

## 2017-07-11 DIAGNOSIS — H01024 Squamous blepharitis left upper eyelid: Secondary | ICD-10-CM | POA: Diagnosis not present

## 2017-07-11 DIAGNOSIS — H02054 Trichiasis without entropian left upper eyelid: Secondary | ICD-10-CM | POA: Diagnosis not present

## 2017-07-11 DIAGNOSIS — H01022 Squamous blepharitis right lower eyelid: Secondary | ICD-10-CM | POA: Diagnosis not present

## 2017-07-11 DIAGNOSIS — H02834 Dermatochalasis of left upper eyelid: Secondary | ICD-10-CM | POA: Diagnosis not present

## 2017-07-13 DIAGNOSIS — M8580 Other specified disorders of bone density and structure, unspecified site: Secondary | ICD-10-CM | POA: Diagnosis not present

## 2017-07-13 DIAGNOSIS — M6281 Muscle weakness (generalized): Secondary | ICD-10-CM | POA: Diagnosis not present

## 2017-07-13 DIAGNOSIS — M25559 Pain in unspecified hip: Secondary | ICD-10-CM | POA: Diagnosis not present

## 2017-07-13 DIAGNOSIS — H02409 Unspecified ptosis of unspecified eyelid: Secondary | ICD-10-CM | POA: Diagnosis not present

## 2017-07-13 DIAGNOSIS — R2681 Unsteadiness on feet: Secondary | ICD-10-CM | POA: Diagnosis not present

## 2017-07-13 DIAGNOSIS — N8500 Endometrial hyperplasia, unspecified: Secondary | ICD-10-CM | POA: Diagnosis not present

## 2017-07-13 DIAGNOSIS — M25561 Pain in right knee: Secondary | ICD-10-CM | POA: Diagnosis not present

## 2017-07-18 DIAGNOSIS — H90A21 Sensorineural hearing loss, unilateral, right ear, with restricted hearing on the contralateral side: Secondary | ICD-10-CM | POA: Diagnosis not present

## 2017-07-18 DIAGNOSIS — H90A32 Mixed conductive and sensorineural hearing loss, unilateral, left ear with restricted hearing on the contralateral side: Secondary | ICD-10-CM | POA: Diagnosis not present

## 2017-07-22 DIAGNOSIS — R2681 Unsteadiness on feet: Secondary | ICD-10-CM | POA: Diagnosis not present

## 2017-07-22 DIAGNOSIS — M79643 Pain in unspecified hand: Secondary | ICD-10-CM | POA: Diagnosis not present

## 2017-07-22 DIAGNOSIS — M25559 Pain in unspecified hip: Secondary | ICD-10-CM | POA: Diagnosis not present

## 2017-07-22 DIAGNOSIS — H25013 Cortical age-related cataract, bilateral: Secondary | ICD-10-CM | POA: Diagnosis not present

## 2017-07-22 DIAGNOSIS — M8580 Other specified disorders of bone density and structure, unspecified site: Secondary | ICD-10-CM | POA: Diagnosis not present

## 2017-07-22 DIAGNOSIS — M25561 Pain in right knee: Secondary | ICD-10-CM | POA: Diagnosis not present

## 2017-07-22 DIAGNOSIS — H02409 Unspecified ptosis of unspecified eyelid: Secondary | ICD-10-CM | POA: Diagnosis not present

## 2017-07-22 DIAGNOSIS — M461 Sacroiliitis, not elsewhere classified: Secondary | ICD-10-CM | POA: Diagnosis not present

## 2017-07-22 DIAGNOSIS — N8500 Endometrial hyperplasia, unspecified: Secondary | ICD-10-CM | POA: Diagnosis not present

## 2017-07-22 DIAGNOSIS — M6281 Muscle weakness (generalized): Secondary | ICD-10-CM | POA: Diagnosis not present

## 2017-07-25 DIAGNOSIS — M6281 Muscle weakness (generalized): Secondary | ICD-10-CM | POA: Diagnosis not present

## 2017-07-25 DIAGNOSIS — M25559 Pain in unspecified hip: Secondary | ICD-10-CM | POA: Diagnosis not present

## 2017-07-25 DIAGNOSIS — M461 Sacroiliitis, not elsewhere classified: Secondary | ICD-10-CM | POA: Diagnosis not present

## 2017-07-25 DIAGNOSIS — M25561 Pain in right knee: Secondary | ICD-10-CM | POA: Diagnosis not present

## 2017-07-25 DIAGNOSIS — R2681 Unsteadiness on feet: Secondary | ICD-10-CM | POA: Diagnosis not present

## 2017-07-25 DIAGNOSIS — M79643 Pain in unspecified hand: Secondary | ICD-10-CM | POA: Diagnosis not present

## 2017-07-27 DIAGNOSIS — M25561 Pain in right knee: Secondary | ICD-10-CM | POA: Diagnosis not present

## 2017-07-27 DIAGNOSIS — M461 Sacroiliitis, not elsewhere classified: Secondary | ICD-10-CM | POA: Diagnosis not present

## 2017-07-27 DIAGNOSIS — R2681 Unsteadiness on feet: Secondary | ICD-10-CM | POA: Diagnosis not present

## 2017-07-27 DIAGNOSIS — M79643 Pain in unspecified hand: Secondary | ICD-10-CM | POA: Diagnosis not present

## 2017-07-27 DIAGNOSIS — M25559 Pain in unspecified hip: Secondary | ICD-10-CM | POA: Diagnosis not present

## 2017-07-27 DIAGNOSIS — M6281 Muscle weakness (generalized): Secondary | ICD-10-CM | POA: Diagnosis not present

## 2017-07-29 DIAGNOSIS — M25561 Pain in right knee: Secondary | ICD-10-CM | POA: Diagnosis not present

## 2017-07-29 DIAGNOSIS — M6281 Muscle weakness (generalized): Secondary | ICD-10-CM | POA: Diagnosis not present

## 2017-07-29 DIAGNOSIS — M25559 Pain in unspecified hip: Secondary | ICD-10-CM | POA: Diagnosis not present

## 2017-07-29 DIAGNOSIS — R2681 Unsteadiness on feet: Secondary | ICD-10-CM | POA: Diagnosis not present

## 2017-07-29 DIAGNOSIS — M461 Sacroiliitis, not elsewhere classified: Secondary | ICD-10-CM | POA: Diagnosis not present

## 2017-07-29 DIAGNOSIS — M79643 Pain in unspecified hand: Secondary | ICD-10-CM | POA: Diagnosis not present

## 2017-08-01 DIAGNOSIS — M79643 Pain in unspecified hand: Secondary | ICD-10-CM | POA: Diagnosis not present

## 2017-08-01 DIAGNOSIS — M6281 Muscle weakness (generalized): Secondary | ICD-10-CM | POA: Diagnosis not present

## 2017-08-01 DIAGNOSIS — M25561 Pain in right knee: Secondary | ICD-10-CM | POA: Diagnosis not present

## 2017-08-01 DIAGNOSIS — R2681 Unsteadiness on feet: Secondary | ICD-10-CM | POA: Diagnosis not present

## 2017-08-01 DIAGNOSIS — M25559 Pain in unspecified hip: Secondary | ICD-10-CM | POA: Diagnosis not present

## 2017-08-01 DIAGNOSIS — M461 Sacroiliitis, not elsewhere classified: Secondary | ICD-10-CM | POA: Diagnosis not present

## 2017-08-04 DIAGNOSIS — M461 Sacroiliitis, not elsewhere classified: Secondary | ICD-10-CM | POA: Diagnosis not present

## 2017-08-04 DIAGNOSIS — M79643 Pain in unspecified hand: Secondary | ICD-10-CM | POA: Diagnosis not present

## 2017-08-04 DIAGNOSIS — S8010XA Contusion of unspecified lower leg, initial encounter: Secondary | ICD-10-CM | POA: Diagnosis not present

## 2017-08-04 DIAGNOSIS — M6281 Muscle weakness (generalized): Secondary | ICD-10-CM | POA: Diagnosis not present

## 2017-08-04 DIAGNOSIS — M25559 Pain in unspecified hip: Secondary | ICD-10-CM | POA: Diagnosis not present

## 2017-08-04 DIAGNOSIS — M25561 Pain in right knee: Secondary | ICD-10-CM | POA: Diagnosis not present

## 2017-08-04 DIAGNOSIS — R2681 Unsteadiness on feet: Secondary | ICD-10-CM | POA: Diagnosis not present

## 2017-08-04 DIAGNOSIS — R600 Localized edema: Secondary | ICD-10-CM | POA: Diagnosis not present

## 2017-08-07 DIAGNOSIS — L97919 Non-pressure chronic ulcer of unspecified part of right lower leg with unspecified severity: Secondary | ICD-10-CM | POA: Diagnosis not present

## 2017-08-08 DIAGNOSIS — M25561 Pain in right knee: Secondary | ICD-10-CM | POA: Diagnosis not present

## 2017-08-08 DIAGNOSIS — M461 Sacroiliitis, not elsewhere classified: Secondary | ICD-10-CM | POA: Diagnosis not present

## 2017-08-08 DIAGNOSIS — R2681 Unsteadiness on feet: Secondary | ICD-10-CM | POA: Diagnosis not present

## 2017-08-08 DIAGNOSIS — M79643 Pain in unspecified hand: Secondary | ICD-10-CM | POA: Diagnosis not present

## 2017-08-08 DIAGNOSIS — M6281 Muscle weakness (generalized): Secondary | ICD-10-CM | POA: Diagnosis not present

## 2017-08-08 DIAGNOSIS — M25559 Pain in unspecified hip: Secondary | ICD-10-CM | POA: Diagnosis not present

## 2017-08-09 ENCOUNTER — Encounter: Payer: Self-pay | Admitting: *Deleted

## 2017-08-10 ENCOUNTER — Encounter: Payer: Self-pay | Admitting: Nurse Practitioner

## 2017-08-10 ENCOUNTER — Ambulatory Visit: Payer: Medicare Other | Admitting: Nurse Practitioner

## 2017-08-10 DIAGNOSIS — I83019 Varicose veins of right lower extremity with ulcer of unspecified site: Secondary | ICD-10-CM | POA: Insufficient documentation

## 2017-08-10 DIAGNOSIS — I872 Venous insufficiency (chronic) (peripheral): Secondary | ICD-10-CM | POA: Insufficient documentation

## 2017-08-10 DIAGNOSIS — I10 Essential (primary) hypertension: Secondary | ICD-10-CM | POA: Diagnosis not present

## 2017-08-10 DIAGNOSIS — L97919 Non-pressure chronic ulcer of unspecified part of right lower leg with unspecified severity: Secondary | ICD-10-CM

## 2017-08-10 DIAGNOSIS — R32 Unspecified urinary incontinence: Secondary | ICD-10-CM | POA: Insufficient documentation

## 2017-08-10 DIAGNOSIS — N3942 Incontinence without sensory awareness: Secondary | ICD-10-CM | POA: Diagnosis not present

## 2017-08-10 NOTE — Assessment & Plan Note (Signed)
an open wound at the anterior right lower leg, presently is covered with Hydrocolloid dressing from urgent care provider, no odor, warmth, or tenderness in the area. Continue Hydrocollid dressing, observe.

## 2017-08-10 NOTE — Assessment & Plan Note (Addendum)
Blood pressure is controlled, continue Valsartan 160mg  daily, update CBC CMP lipid panel.

## 2017-08-10 NOTE — Patient Instructions (Addendum)
Obtain CBC CMP  Lipid panel next week, return in clinic in 3 months.

## 2017-08-10 NOTE — Assessment & Plan Note (Signed)
BLE, trace edema seen, observe.

## 2017-08-10 NOTE — Progress Notes (Signed)
Provider:  Marlana Latus NP Location:   Clinic [facility]   Place of Service:   Clinic  PCP: Blanchie Serve, MD Patient Care Team: Blanchie Serve, MD as PCP - General (Internal Medicine) Jency Schnieders X, NP as Nurse Practitioner (Internal Medicine)  Extended Emergency Contact Information Primary Emergency Contact: Rosol,Floyd Address: [place].          [address]          [place], [place] [address] [place] Phone: [phone] Relation: Spouse Secondary Emergency Contact: Dockham,[name] Address: [place],  [address] [name] of [place] Phone: [phone] Work Phone: [phone] Mobile Phone: [phone] Relation: Daughter  Code Status: DNR Goals of Care: Advanced Directive information Advanced Directives 05/19/2016  Does Patient Have a Medical Advance Directive? Yes  Type of Advance Directive -  Does patient want to make changes to medical advance directive? No - Patient declined      Chief Complaint  Patient presents with  . Establish Care    (R) leg wound x 2 weeks   an  HPI: Patient is a 84 y.o. female seen today for admission to the patient has an open wound at the anterior right lower leg, presently is covered with Hydrocolloid dressing from urgent care provider, no odor, warmth, or tenderness in the area. BLE mild swelling and brownish pigmentation seen. She stated she urinary incontinence at night, not new, Kegel exercises helped in the past, no Urology evaluation.     Past Medical History:  Diagnosis Date  . Hypertension    No past surgical history on file.  reports that she has quit smoking. She quit after 12.00 years of use. she has never used smokeless tobacco. She reports that she drinks about 4.2 oz of alcohol per week. She reports that she does not use drugs. Social History   Socioeconomic History  . Marital status: Married    Spouse name: Tyrone Nine  . Number of children: 2    . Years of education: Not on file  . Highest education level: Not on file  Social Needs  . Financial resource strain: Not on file  . Food insecurity - worry: Not on file  . Food insecurity - inability: Not on file  . Transportation needs - medical: Not on file  . Transportation needs - non-medical: Not on file  Occupational History  . Occupation: Pharmacist, hospital    Comment: retired  Tobacco Use  . Smoking status: Former Smoker    Years: 12.00  . Smokeless tobacco: Never Used  Substance and Sexual Activity  . Alcohol use: Yes    Alcohol/week: 4.2 oz    Types: 7 Glasses of wine per week  . Drug use: No  . Sexual activity: Not on file  Other Topics Concern  . Not on file  Social History Narrative   Social History     Socioeconomic History       Marital status: Married   09/01/1954       Spouse name: Tyrone Nine   Two people stay in the home on the fourth floor       Number of children: 2       Years of education:       Highest education level: Not on file    Exercise: yoga, walking     Social Emergency planning/management officer  strain: Not on file       Food insecurity - worry: Not on file       Food insecurity - inability: Not on file       Transportation needs - medical: Not on file       Transportation needs - non-medical: Not on file     Occupational History       Not on file     Tobacco Use       Smoking status: Never Smoker       Smokeless tobacco: Never Used     Substance and Sexual Activity       Alcohol use: No       Drug use: No       Sexual activity: Not on file     Other Topics       Concerns:         Not on file     Social History Narrative       Not on file    Functional Status Survey:    Family History  Problem Relation Age of Onset  . Heart failure Mother   . Heart disease Father   . Arthritis Sister     Health Maintenance  Topic Date Due  . Samul Dada  05/04/1948  . DEXA SCAN  05/04/1996  . PNA vac Low Risk Adult (2 of 2 - PCV13) 06/14/2018  .  INFLUENZA VACCINE  Completed    Allergies  Allergen Reactions  . Ace Inhibitors   . Flagyl [Metronidazole]   . Penicillins     Allergies as of 08/10/2017      Reactions   Ace Inhibitors    Flagyl [metronidazole]    Penicillins       Medication List        Accurate as of 08/10/17 11:59 PM. Always use your most recent med list.          aspirin EC 81 MG tablet Take 81 mg by mouth daily.   CALCIUM 500 +D 500-400 MG-UNIT Tabs Generic drug:  Calcium Carb-Cholecalciferol Take 1 tablet by mouth daily.   multivitamin with minerals Tabs tablet Take 1 tablet by mouth daily.   omega-3 acid ethyl esters 1 g capsule Commonly known as:  LOVAZA Take 1 g by mouth daily.   valsartan 160 MG tablet Commonly known as:  DIOVAN Take 160 mg by mouth daily.   vitamin C 1000 MG tablet Take 1,000 mg by mouth daily.       Review of Systems  Constitutional: Negative for activity change, appetite change, chills, diaphoresis, fatigue and fever.  HENT: Positive for hearing loss. Negative for congestion, trouble swallowing and voice change.   Eyes: Negative for visual disturbance.  Respiratory: Negative for cough, choking, shortness of breath and wheezing.   Cardiovascular: Positive for leg swelling. Negative for chest pain and palpitations.  Gastrointestinal: Negative for abdominal distention, abdominal pain, constipation, diarrhea, nausea and vomiting.  Endocrine: Negative for cold intolerance.  Genitourinary: Negative for difficulty urinating, dysuria and urgency.       Incontinent of urine at night.   Musculoskeletal: Negative for back pain, gait problem and joint swelling.  Skin: Positive for color change and wound.       Brownish appearance BLE. Anterior left lower leg open wound.   Neurological: Negative for dizziness, tremors, speech difficulty, weakness and headaches.  Psychiatric/Behavioral: Negative for agitation, behavioral problems, confusion, hallucinations and sleep  disturbance. The patient is not nervous/anxious.  Vitals:   08/10/17 0914  BP: 120/80  Pulse: 93  Resp: 18  Temp: 97.8 F (36.6 C)  TempSrc: Oral  SpO2: 97%  Weight: 109 lb 9.6 oz (49.7 kg)  Height: 5\' 3"  (1.6 m)   Body mass index is 19.41 kg/m. Physical Exam  Constitutional: She is oriented to person, place, and time. She appears well-developed and well-nourished. No distress.  HENT:  Head: Normocephalic and atraumatic.  Eyes: Conjunctivae and EOM are normal. Pupils are equal, round, and reactive to light.  Neck: Normal range of motion. Neck supple. No JVD present. No thyromegaly present.  Cardiovascular: Normal rate, regular rhythm and normal heart sounds.  No murmur heard. Pulmonary/Chest: Effort normal and breath sounds normal. She has no wheezes. She has no rales.  Abdominal: Soft. Bowel sounds are normal. She exhibits no distension. There is no tenderness. There is no rebound and no guarding.  Genitourinary: Vagina normal. No vaginal discharge found.  Musculoskeletal: Normal range of motion. She exhibits edema. She exhibits no tenderness.  Kyphosis. Bunions R+L   Neurological: She is alert and oriented to person, place, and time. She has normal reflexes. No cranial nerve deficit. Coordination normal.  Skin: Skin is warm and dry. She is not diaphoretic.  5x5 cm anterior left lower leg stasis ulcer, not infected.   Psychiatric: She has a normal mood and affect. Her behavior is normal. Judgment and thought content normal.    Labs reviewed: Basic Metabolic Panel: No results for input(s): NA, K, CL, CO2, GLUCOSE, BUN, CREATININE, CALCIUM, MG, PHOS in the last 8760 hours. Liver Function Tests: No results for input(s): AST, ALT, ALKPHOS, BILITOT, PROT, ALBUMIN in the last 8760 hours. No results for input(s): LIPASE, AMYLASE in the last 8760 hours. No results for input(s): AMMONIA in the last 8760 hours. CBC: No results for input(s): WBC, NEUTROABS, HGB, HCT, MCV, PLT in  the last 8760 hours. Cardiac Enzymes: No results for input(s): CKTOTAL, CKMB, CKMBINDEX, TROPONINI in the last 8760 hours. BNP: Invalid input(s): POCBNP No results found for: HGBA1C No results found for: TSH No results found for: VITAMINB12 No results found for: FOLATE No results found for: IRON, TIBC, FERRITIN  Imaging and Procedures obtained prior to SNF admission: No results found.  Assessment/Plan  Hypertension Blood pressure is controlled, continue Valsartan 160mg  daily, update CBC CMP lipid panel.    Venous insufficiency (chronic) (peripheral) BLE, trace edema seen, observe.   Stasis ulcer of right lower extremity ([place]) an open wound at the anterior right lower leg, presently is covered with Hydrocolloid dressing from urgent care provider, no odor, warmth, or tenderness in the area. Continue Hydrocollid dressing, observe.   Incontinent of urine She stated she urinary incontinence at night, not new, Kegel exercises helped in the past, no Urology evaluation.      Family/ staff Communication: plan of care reviewed with the patient and her husband.   Labs/tests ordered: CBC CMP  lipid panel   Time spend 60 minutes.

## 2017-08-10 NOTE — Assessment & Plan Note (Signed)
She stated she urinary incontinence at night, not new, Kegel exercises helped in the past, no Urology evaluation.

## 2017-08-15 DIAGNOSIS — M8580 Other specified disorders of bone density and structure, unspecified site: Secondary | ICD-10-CM | POA: Diagnosis not present

## 2017-08-15 DIAGNOSIS — H02409 Unspecified ptosis of unspecified eyelid: Secondary | ICD-10-CM | POA: Diagnosis not present

## 2017-08-15 DIAGNOSIS — H25013 Cortical age-related cataract, bilateral: Secondary | ICD-10-CM | POA: Diagnosis not present

## 2017-08-15 DIAGNOSIS — R2681 Unsteadiness on feet: Secondary | ICD-10-CM | POA: Diagnosis not present

## 2017-08-15 DIAGNOSIS — M79643 Pain in unspecified hand: Secondary | ICD-10-CM | POA: Diagnosis not present

## 2017-08-15 DIAGNOSIS — M25561 Pain in right knee: Secondary | ICD-10-CM | POA: Diagnosis not present

## 2017-08-15 DIAGNOSIS — M6281 Muscle weakness (generalized): Secondary | ICD-10-CM | POA: Diagnosis not present

## 2017-08-15 DIAGNOSIS — R41841 Cognitive communication deficit: Secondary | ICD-10-CM | POA: Diagnosis not present

## 2017-08-15 DIAGNOSIS — N8500 Endometrial hyperplasia, unspecified: Secondary | ICD-10-CM | POA: Diagnosis not present

## 2017-08-15 DIAGNOSIS — M25559 Pain in unspecified hip: Secondary | ICD-10-CM | POA: Diagnosis not present

## 2017-08-16 ENCOUNTER — Other Ambulatory Visit: Payer: Medicare Other

## 2017-08-16 DIAGNOSIS — L97919 Non-pressure chronic ulcer of unspecified part of right lower leg with unspecified severity: Secondary | ICD-10-CM | POA: Diagnosis not present

## 2017-08-16 DIAGNOSIS — I10 Essential (primary) hypertension: Secondary | ICD-10-CM | POA: Diagnosis not present

## 2017-08-16 DIAGNOSIS — I83019 Varicose veins of right lower extremity with ulcer of unspecified site: Secondary | ICD-10-CM

## 2017-08-16 LAB — CBC WITH DIFFERENTIAL/PLATELET
Basophils Absolute: 83 cells/uL (ref 0–200)
Basophils Relative: 1.3 %
EOS PCT: 4.9 %
Eosinophils Absolute: 314 cells/uL (ref 15–500)
HEMATOCRIT: 33.1 % — AB (ref 35.0–45.0)
HEMOGLOBIN: 11.4 g/dL — AB (ref 11.7–15.5)
LYMPHS ABS: 1875 {cells}/uL (ref 850–3900)
MCH: 32.7 pg (ref 27.0–33.0)
MCHC: 34.4 g/dL (ref 32.0–36.0)
MCV: 94.8 fL (ref 80.0–100.0)
MPV: 9.6 fL (ref 7.5–12.5)
Monocytes Relative: 9.7 %
NEUTROS ABS: 3507 {cells}/uL (ref 1500–7800)
Neutrophils Relative %: 54.8 %
Platelets: 305 10*3/uL (ref 140–400)
RBC: 3.49 10*6/uL — AB (ref 3.80–5.10)
RDW: 12.3 % (ref 11.0–15.0)
Total Lymphocyte: 29.3 %
WBC mixed population: 621 cells/uL (ref 200–950)
WBC: 6.4 10*3/uL (ref 3.8–10.8)

## 2017-08-16 LAB — LIPID PANEL
Cholesterol: 159 mg/dL (ref <200)
HDL: 73 mg/dL (ref >50)
LDL CHOLESTEROL (CALC): 73 mg/dL
Non-HDL Cholesterol (Calc): 86 mg/dL (calc) (ref <130)
TRIGLYCERIDES: 53 mg/dL (ref <150)
Total CHOL/HDL Ratio: 2.2 (calc) (ref <5.0)

## 2017-08-16 LAB — COMPLETE METABOLIC PANEL WITH GFR
AG Ratio: 1.5 (calc) (ref 1.0–2.5)
ALKALINE PHOSPHATASE (APISO): 62 U/L (ref 33–130)
ALT: 11 U/L (ref 6–29)
AST: 19 U/L (ref 10–35)
Albumin: 4.1 g/dL (ref 3.6–5.1)
BILIRUBIN TOTAL: 0.5 mg/dL (ref 0.2–1.2)
BUN/Creatinine Ratio: 28 (calc) — ABNORMAL HIGH (ref 6–22)
BUN: 29 mg/dL — AB (ref 7–25)
CHLORIDE: 104 mmol/L (ref 98–110)
CO2: 30 mmol/L (ref 20–32)
Calcium: 9.7 mg/dL (ref 8.6–10.4)
Creat: 1.05 mg/dL — ABNORMAL HIGH (ref 0.60–0.88)
GFR, Est African American: 56 mL/min/{1.73_m2} — ABNORMAL LOW (ref >=60)
GFR, Est Non African American: 48 mL/min/{1.73_m2} — ABNORMAL LOW (ref >=60)
GLUCOSE: 88 mg/dL (ref 65–99)
Globulin: 2.7 g/dL (calc) (ref 1.9–3.7)
Potassium: 4.5 mmol/L (ref 3.5–5.3)
Sodium: 138 mmol/L (ref 135–146)
TOTAL PROTEIN: 6.8 g/dL (ref 6.1–8.1)

## 2017-08-18 ENCOUNTER — Telehealth: Payer: Self-pay | Admitting: *Deleted

## 2017-08-18 NOTE — Telephone Encounter (Signed)
Called patient to inform her about her lab results, left message would call her back in the morning.

## 2017-08-18 NOTE — Telephone Encounter (Signed)
-----   Message from Man Otho Darner, NP sent at 08/17/2017  5:26 PM EST ----- Regarding: labs Please inform the patient her renal function and hemaglobin are improved slightly comparing to prior.

## 2017-08-23 ENCOUNTER — Non-Acute Institutional Stay: Payer: Medicare Other | Admitting: Internal Medicine

## 2017-08-23 ENCOUNTER — Encounter: Payer: Self-pay | Admitting: Internal Medicine

## 2017-08-23 VITALS — BP 122/68 | HR 90 | Temp 97.9°F | Resp 16 | Ht 63.0 in | Wt 112.4 lb

## 2017-08-23 DIAGNOSIS — L97911 Non-pressure chronic ulcer of unspecified part of right lower leg limited to breakdown of skin: Secondary | ICD-10-CM

## 2017-08-23 MED ORDER — "ALGICELL CALCIUM DRESSING 2""X2 EX MISC": CUTANEOUS | 0 refills | Status: DC

## 2017-08-23 NOTE — Progress Notes (Signed)
[facility]  Provider: Blanchie Serve MD   Location:  [place] [facility] Service:  Clinic (12)  PCP: Blanchie Serve, MD Patient Care Team: Blanchie Serve, MD as PCP - General (Internal Medicine) Mast, Man X, NP as Nurse Practitioner (Internal Medicine)  Extended Emergency Contact Information Primary Emergency Contact: Weis,Floyd Address: [address].          [address]          [place], [place] [address] [place] Phone: [phone] Relation: Spouse Secondary Emergency Contact: Norfleet,[name] Address: [place], [place] [address] [name] of [place] Phone: [phone] Work Phone: [phone] Mobile Phone: [phone] Relation: Daughter  Goals of Care: Advanced Directive information Advanced Directives 05/19/2016  Does Patient Have a Medical Advance Directive? Yes  Type of Advance Directive -  Does patient want to make changes to medical advance directive? No - Patient declined      Chief Complaint  Patient presents with  . Acute Visit    right leg wound  . Medication Refill    No refills needed at this time    HPI: Patient is a 84 y.o. female seen today for acute visit for her leg wound. Left leg has open wound for about 4 weeks. She has been treated with antibiotic once for concern for cellulitis at urgent care. The wound has not improved. Per nursing, she was having seroangineous drainage yesterday. She is getting it cleaned with NS and having non adherent dressing on top. She is seen in the clinic today. She denies any pain to wound area. She complaints of pain to her feet and leg with walking but none at rest. She has remote history of smoking.     Past Medical History:  Diagnosis Date  . Hypertension    History reviewed. No pertinent surgical history.  reports that she has quit smoking. She quit after 12.00 years of use. she has never used  smokeless tobacco. She reports that she drinks about 4.2 oz of alcohol per week. She reports that she does not use drugs. Social History   Socioeconomic History  . Marital status: Married    Spouse name: Tyrone Nine  . Number of children: 2  . Years of education: Not on file  . Highest education level: Not on file  Social Needs  . Financial resource strain: Not on file  . Food insecurity - worry: Not on file  . Food insecurity - inability: Not on file  . Transportation needs - medical: Not on file  . Transportation needs - non-medical: Not on file  Occupational History  . Occupation: Pharmacist, hospital    Comment: retired  Tobacco Use  . Smoking status: Former Smoker    Years: 12.00  . Smokeless tobacco: Never Used  Substance and Sexual Activity  . Alcohol use: Yes    Alcohol/week: 4.2 oz    Types: 7 Glasses of wine per week  . Drug use: No  . Sexual activity: Not on file  Other Topics Concern  . Not on file  Social History Narrative   Social History     Socioeconomic History       Marital status: Married   09/01/1954       Spouse name: Tyrone Nine   Two people stay in the home on the fourth floor  Number of children: 2       Years of education:       Highest education level: Not on file    Exercise: yoga, walking     Social Needs       Financial resource strain: Not on file       Food insecurity - worry: Not on file       Food insecurity - inability: Not on file       Transportation needs - medical: Not on file       Transportation needs - non-medical: Not on file     Occupational History       Not on file     Tobacco Use       Smoking status: Never Smoker       Smokeless tobacco: Never Used     Substance and Sexual Activity       Alcohol use: No       Drug use: No       Sexual activity: Not on file     Other Topics       Concerns:         Not on file     Social History Narrative       Not on file    Functional Status Survey:    Family History  Problem Relation Age of  Onset  . Heart failure Mother   . Heart disease Father   . Arthritis Sister     Health Maintenance  Topic Date Due  . Samul Dada  05/04/1948  . DEXA SCAN  05/04/1996  . PNA vac Low Risk Adult (2 of 2 - PCV13) 06/14/2018  . INFLUENZA VACCINE  Completed    Allergies  Allergen Reactions  . Ace Inhibitors   . Flagyl [Metronidazole]   . Penicillins     Outpatient Encounter Medications as of 08/23/2017  Medication Sig  . Ascorbic Acid (VITAMIN C) 1000 MG tablet Take 1,000 mg by mouth daily.  Marland Kitchen aspirin EC 81 MG tablet Take 81 mg by mouth daily.  . Calcium Carb-Cholecalciferol (CALCIUM 500 +D) 500-400 MG-UNIT TABS Take 1 tablet by mouth daily.  . Multiple Vitamin (MULTIVITAMIN WITH MINERALS) TABS tablet Take 1 tablet by mouth daily.  Marland Kitchen omega-3 acid ethyl esters (LOVAZA) 1 g capsule Take 1 g by mouth daily.  . valsartan (DIOVAN) 160 MG tablet Take 160 mg by mouth daily.  . Calcium Alginate (ALGICELL CALCIUM DRESSING 2"X2) MISC Cleanse wound area with normal saline and pat dry. Apply algicell calcium dressing to right leg wound and change dressing every 3 days and as needed.   No facility-administered encounter medications on file as of 08/23/2017.     Review of Systems  Constitutional: Negative for chills and fever.  Respiratory: Negative for cough and shortness of breath.   Cardiovascular: Positive for leg swelling. Negative for chest pain and palpitations.  Gastrointestinal: Negative for abdominal pain.  Musculoskeletal:       No fall  Skin: Positive for wound.  Neurological: Negative for dizziness and numbness.    Vitals:   08/23/17 0842  BP: 122/68  Pulse: 90  Resp: 16  Temp: 97.9 F (36.6 C)  TempSrc: Oral  SpO2: 96%  Weight: 112 lb 6.4 oz (51 kg)  Height: 5\' 3"  (1.6 m)   Body mass index is 19.91 kg/m. Physical Exam  Constitutional: She is oriented to person, place, and time. No distress.  Thin built, frail, elderly female  HENT:  Head: Normocephalic  and  atraumatic.  Neck: Neck supple.  Cardiovascular: Normal rate and regular rhythm.  Pulmonary/Chest: Effort normal and breath sounds normal. No respiratory distress. She has no wheezes. She has no rales.  Abdominal: Soft. Bowel sounds are normal.  Musculoskeletal: She exhibits edema.  2+ pitting edema around right ankle and 1+ around left ankle. 2 x 2 cm open wound to right leg, wound bed is dark red in color, some slough present. Wound edge irregular. Non tender. Normal temperature to touch. Palpable dorsalis pedis. Unable to palpate posterior tibialis.   Neurological: She is alert and oriented to person, place, and time.  Skin: Skin is warm and dry. She is not diaphoretic.  Hair loss to both legs, hemosiderosis to both lower leg skin area noted  Psychiatric: She has a normal mood and affect.    Labs reviewed: Basic Metabolic Panel: Recent Labs    08/16/17 0705  NA 138  K 4.5  CL 104  CO2 30  GLUCOSE 88  BUN 29*  CREATININE 1.05*  CALCIUM 9.7   Liver Function Tests: Recent Labs    08/16/17 0705  AST 19  ALT 11  BILITOT 0.5  PROT 6.8   No results for input(s): LIPASE, AMYLASE in the last 8760 hours. No results for input(s): AMMONIA in the last 8760 hours. CBC: Recent Labs    08/16/17 0705  WBC 6.4  NEUTROABS 3,507  HGB 11.4*  HCT 33.1*  MCV 94.8  PLT 305   Cardiac Enzymes: No results for input(s): CKTOTAL, CKMB, CKMBINDEX, TROPONINI in the last 8760 hours. BNP: Invalid input(s): POCBNP No results found for: HGBA1C No results found for: TSH No results found for: VITAMINB12 No results found for: FOLATE No results found for: IRON, TIBC, FERRITIN  Lipid Panel: Recent Labs    08/16/17 0705  CHOL 159  HDL 73  TRIG 53  CHOLHDL 2.2   No results found for: HGBA1C  Procedures since last visit: No results found.  Assessment/Plan  1. Leg ulcer, right, limited to breakdown of skin ([place]) With hemosiderosis to lower leg skin and hair loss, concern for venous  ulcer. Will obtain ABI to rule out arterial disease and decide on level of compression required. To keep legs elevated at rest. Wound care with cleaning with normal saline, apply calcium alginate dressing to help absorb excess drainage from slough and keep wound bed moist as well. This should help promote healing. At present, no clinical signs of infection.   - VAS [place] ABI WITH/WO TBI; Future - Calcium Alginate (ALGICELL CALCIUM DRESSING 2"X2) MISC; Cleanse wound area with normal saline and pat dry. Apply algicell calcium dressing to right leg wound and change dressing every 3 days and as needed.  Dispense: 15 each; Refill: 0    Labs/tests ordered:  ABI  Next appointment: 2 weeks or earlier if needed  Communication: reviewed care plan with patient and IL charge nurse.     Blanchie Serve, MD Internal Medicine [facility] [address] [place], [place] [address] Cell Phone (Monday-Friday 8 am - 5 pm): [phone] On Call: [phone] and follow prompts after 5 pm and on weekends Office Phone: [phone] Office Fax: [phone]

## 2017-08-23 NOTE — Patient Instructions (Signed)
Do not use compression stocking for now until we have result of ultrasound study of your leg.  Keep leg elevated at rest.  Dressing change as discussed by IL nurse.

## 2017-08-24 DIAGNOSIS — M79643 Pain in unspecified hand: Secondary | ICD-10-CM | POA: Diagnosis not present

## 2017-08-24 DIAGNOSIS — M25561 Pain in right knee: Secondary | ICD-10-CM | POA: Diagnosis not present

## 2017-08-24 DIAGNOSIS — M6281 Muscle weakness (generalized): Secondary | ICD-10-CM | POA: Diagnosis not present

## 2017-08-24 DIAGNOSIS — R41841 Cognitive communication deficit: Secondary | ICD-10-CM | POA: Diagnosis not present

## 2017-08-24 DIAGNOSIS — R2681 Unsteadiness on feet: Secondary | ICD-10-CM | POA: Diagnosis not present

## 2017-08-24 DIAGNOSIS — M25559 Pain in unspecified hip: Secondary | ICD-10-CM | POA: Diagnosis not present

## 2017-08-29 NOTE — Telephone Encounter (Signed)
-----   Message from Peggy Otho Darner, NP sent at 08/17/2017  5:26 PM EST ----- Regarding: labs Please inform the patient her renal function and hemaglobin are improved slightly comparing to prior.

## 2017-08-29 NOTE — Telephone Encounter (Signed)
(  late entry) spoke with patient regarding her labs the following morning when she stopped by the office to have Manxie look at her leg wound.

## 2017-08-31 ENCOUNTER — Ambulatory Visit (HOSPITAL_COMMUNITY)
Admission: RE | Admit: 2017-08-31 | Discharge: 2017-08-31 | Disposition: A | Discharge status: Home / Self Care | Payer: Medicare Other | Source: Ambulatory Visit | Attending: Internal Medicine | Admitting: Internal Medicine

## 2017-08-31 DIAGNOSIS — L97911 Non-pressure chronic ulcer of unspecified part of right lower leg limited to breakdown of skin: Secondary | ICD-10-CM | POA: Insufficient documentation

## 2017-08-31 NOTE — Progress Notes (Signed)
VASCULAR LAB PRELIMINARY  ARTERIAL  ABI completed:    RIGHT    LEFT    PRESSURE WAVEFORM  PRESSURE WAVEFORM  BRACHIAL 172 Triphasic BRACHIAL 161 Triphasic  DP Not obtained due to wound location Triphasic DP 209 Triphasic  AT   AT    PT Not obtained due to wound location Triphasic PT 205 Triphasic  PER   PER    GREAT TOE 168 NA GREAT TOE 227 NA    RIGHT LEFT  ABI Unable to calculate due to wound location 1.22  TBI 0.98 1.32   Bilateral pedal waveforms are within normal limits at rest.  Unable to obtain right ABI due to wound location. Right TBI within normal limits at rest. Left ABI and TBI within normal limits at rest.  08/31/2017 2:58 PM Maudry Mayhew, BS, RVT, RDCS, RDMS

## 2017-09-06 ENCOUNTER — Encounter: Payer: Self-pay | Admitting: Internal Medicine

## 2017-09-06 ENCOUNTER — Non-Acute Institutional Stay: Payer: Medicare Other | Admitting: Internal Medicine

## 2017-09-06 ENCOUNTER — Telehealth: Payer: Self-pay

## 2017-09-06 ENCOUNTER — Ambulatory Visit: Payer: Medicare Other | Admitting: Internal Medicine

## 2017-09-06 VITALS — BP 118/70 | HR 88 | Temp 98.2°F | Resp 16 | Ht 63.0 in | Wt 111.6 lb

## 2017-09-06 DIAGNOSIS — T148XXA Other injury of unspecified body region, initial encounter: Secondary | ICD-10-CM | POA: Diagnosis not present

## 2017-09-06 DIAGNOSIS — L97919 Non-pressure chronic ulcer of unspecified part of right lower leg with unspecified severity: Secondary | ICD-10-CM | POA: Diagnosis not present

## 2017-09-06 DIAGNOSIS — L089 Local infection of the skin and subcutaneous tissue, unspecified: Secondary | ICD-10-CM | POA: Diagnosis not present

## 2017-09-06 DIAGNOSIS — I83019 Varicose veins of right lower extremity with ulcer of unspecified site: Secondary | ICD-10-CM | POA: Diagnosis not present

## 2017-09-06 MED ORDER — DOXYCYCLINE HYCLATE 100 MG PO TABS: 100.0000 mg | ORAL_TABLET | Freq: Two times a day (BID) | ORAL | 0 refills | Status: DC

## 2017-09-06 NOTE — Progress Notes (Signed)
[facility]  Provider: Blanchie Serve MD   Location:  [place] of Service:  Clinic (12)  PCP: Blanchie Serve, MD Patient Care Team: Blanchie Serve, MD as PCP - General (Internal Medicine) Mast, Man X, NP as Nurse Practitioner (Internal Medicine)  Extended Emergency Contact Information Primary Emergency Contact: Fauble,Floyd Address: [address] [place].          [address]          [place], [place] [address] [place] Phone: [phone] Relation: Spouse Secondary Emergency Contact: Sather,[name] Address: [place], [place] [address] [name] of [place] Phone: [phone] Work Phone: [phone] Mobile Phone: [phone] Relation: Daughter  Goals of Care: Advanced Directive information Advanced Directives 05/19/2016  Does Patient Have a Medical Advance Directive? Yes  Type of Advance Directive -  Does patient want to make changes to medical advance directive? No - Patient declined      Chief Complaint  Patient presents with  . Acute Visit    right leg wound follow up    HPI: Patient is a 84 y.o. female seen today for follow up on her right leg wound. She is receiving calcium alginate dressing to her wound. She denies any pain or pressure. No further trauma reported. Denies fever or chills. She had ABI that resulted normal. Last seen 08/23/17. she has remote history of smoking.     Past Medical History:  Diagnosis Date  . Hypertension    No past surgical history on file.  reports that she has quit smoking. She quit after 12.00 years of use. She has never used smokeless tobacco. She reports that she drinks about 4.2 oz of alcohol per week. She reports that she does not use drugs. Social History   Socioeconomic History  . Marital status: Married    Spouse name: Tyrone Nine  . Number of children: 2  . Years of education: Not on file  . Highest education level:  Not on file  Occupational History  . Occupation: Pharmacist, hospital    Comment: retired  Scientific laboratory technician  . Financial resource strain: Not on file  . Food insecurity:    Worry: Not on file    Inability: Not on file  . Transportation needs:    Medical: Not on file    Non-medical: Not on file  Tobacco Use  . Smoking status: Former Smoker    Years: 12.00  . Smokeless tobacco: Never Used  Substance and Sexual Activity  . Alcohol use: Yes    Alcohol/week: 4.2 oz    Types: 7 Glasses of wine per week  . Drug use: No  . Sexual activity: Not on file  Lifestyle  . Physical activity:    Days per week: Not on file    Minutes per session: Not on file  . Stress: Not on file  Relationships  . Social connections:    Talks on phone: Not on file    Gets together: Not on file    Attends religious service: Not on file    Active member of club or organization: Not on file    Attends meetings of clubs or organizations: Not on file    Relationship status: Not on file  . Intimate partner violence:    Fear of current or ex partner: Not on file    Emotionally abused: Not  on file    Physically abused: Not on file    Forced sexual activity: Not on file  Other Topics Concern  . Not on file  Social History Narrative   Social History     Socioeconomic History       Marital status: Married   09/01/1954       Spouse name: Tyrone Nine   Two people stay in the home on the fourth floor       Number of children: 2       Years of education:       Highest education level: Not on file    Exercise: yoga, walking     Social Needs       Financial resource strain: Not on file       Food insecurity - worry: Not on file       Food insecurity - inability: Not on file       Transportation needs - medical: Not on file       Transportation needs - non-medical: Not on file     Occupational History       Not on file     Tobacco Use       Smoking status: Never Smoker       Smokeless tobacco: Never Used     Substance and Sexual  Activity       Alcohol use: No       Drug use: No       Sexual activity: Not on file     Other Topics       Concerns:         Not on file     Social History Narrative       Not on file    Functional Status Survey:    Family History  Problem Relation Age of Onset  . Heart failure Mother   . Heart disease Father   . Arthritis Sister     Health Maintenance  Topic Date Due  . Samul Dada  05/04/1948  . DEXA SCAN  05/04/1996  . PNA vac Low Risk Adult (2 of 2 - PCV13) 06/14/2018  . INFLUENZA VACCINE  Completed    Allergies  Allergen Reactions  . Ace Inhibitors   . Flagyl [Metronidazole]   . Penicillins     Outpatient Encounter Medications as of 09/06/2017  Medication Sig  . Ascorbic Acid (VITAMIN C) 1000 MG tablet Take 1,000 mg by mouth daily.  Marland Kitchen aspirin EC 81 MG tablet Take 81 mg by mouth daily.  . Calcium Alginate (ALGICELL CALCIUM DRESSING 2"X2) MISC Cleanse wound area with normal saline and pat dry. Apply algicell calcium dressing to right leg wound and change dressing every 3 days and as needed.  . Calcium Carb-Cholecalciferol (CALCIUM 500 +D) 500-400 MG-UNIT TABS Take 1 tablet by mouth daily.  . Multiple Vitamin (MULTIVITAMIN WITH MINERALS) TABS tablet Take 1 tablet by mouth daily.  Marland Kitchen omega-3 acid ethyl esters (LOVAZA) 1 g capsule Take 1 g by mouth daily.  . valsartan (DIOVAN) 160 MG tablet Take 160 mg by mouth daily.  Marland Kitchen doxycycline (VIBRA-TABS) 100 MG tablet Take 1 tablet (100 mg total) by mouth 2 (two) times daily.   No facility-administered encounter medications on file as of 09/06/2017.     Review of Systems  Constitutional: Negative for appetite change, chills and fever.  Respiratory: Negative for shortness of breath.   Cardiovascular: Positive for leg swelling. Negative for chest pain and palpitations.  Gastrointestinal: Negative for  abdominal pain, nausea and vomiting.  Musculoskeletal: Negative for gait problem.       No fall  Skin: Positive for wound.    Neurological: Negative for dizziness and numbness.  Psychiatric/Behavioral: Negative for behavioral problems.    Vitals:   09/06/17 0826  BP: 118/70  Pulse: 88  Resp: 16  Temp: 98.2 F (36.8 C)  TempSrc: Oral  SpO2: 97%  Weight: 111 lb 9.6 oz (50.6 kg)  Height: '5\' 3"'  (1.6 m)   Body mass index is 19.77 kg/m.   Wt Readings from Last 3 Encounters:  09/06/17 111 lb 9.6 oz (50.6 kg)  08/23/17 112 lb 6.4 oz (51 kg)  08/10/17 109 lb 9.6 oz (49.7 kg)   Physical Exam  Constitutional: She is oriented to person, place, and time. No distress.  Thin built, frail, elderly female  HENT:  Head: Normocephalic and atraumatic.  Cardiovascular: Normal rate and regular rhythm.  Pulmonary/Chest: Effort normal and breath sounds normal. No respiratory distress. She has no wheezes. She has no rales.  Musculoskeletal: She exhibits edema.  1+ pitting edema around ankle. 2 x 2 cm wound to right lower leg with fibrous tissue to 3/4 of the wound but there is a small opening from where pus is expressed. This is mixed with blood. Pus drained out. Non tender. distal pulses palpable. Chronic skin changes to both legs noted.   Neurological: She is alert and oriented to person, place, and time.  Skin: Skin is warm and dry. She is not diaphoretic.  Hair loss to both legs, hemosiderosis to both lower leg skin area noted  Psychiatric: She has a normal mood and affect.    Labs reviewed: Basic Metabolic Panel: Recent Labs    08/16/17 0705  NA 138  K 4.5  CL 104  CO2 30  GLUCOSE 88  BUN 29*  CREATININE 1.05*  CALCIUM 9.7   Liver Function Tests: Recent Labs    08/16/17 0705  AST 19  ALT 11  BILITOT 0.5  PROT 6.8   No results for input(s): LIPASE, AMYLASE in the last 8760 hours. No results for input(s): AMMONIA in the last 8760 hours. CBC: Recent Labs    08/16/17 0705  WBC 6.4  NEUTROABS 3,507  HGB 11.4*  HCT 33.1*  MCV 94.8  PLT 305   Cardiac Enzymes: No results for input(s):  CKTOTAL, CKMB, CKMBINDEX, TROPONINI in the last 8760 hours. BNP: Invalid input(s): POCBNP No results found for: HGBA1C No results found for: TSH No results found for: VITAMINB12 No results found for: FOLATE No results found for: IRON, TIBC, FERRITIN  Lipid Panel: Recent Labs    08/16/17 0705  CHOL 159  HDL 73  LDLCALC 73  TRIG 53  CHOLHDL 2.2   No results found for: HGBA1C  Procedures sin ce last visit: No results found.   08/31/17 ABI- right ABI not done due to wound. Right TBI 0.98: left ABI 1.22, left TBI 1.32   Assessment/Plan  1. Wound infection - doxycycline (VIBRA-TABS) 100 MG tablet; Take 1 tablet (100 mg total) by mouth 2 (two) times daily.  Dispense: 20 tablet; Refill: 0 - Ambulatory referral to Pain Clinic for wound care. - CMP with eGFR([facility]); Future - CBC with Differential/Platelets; Future  2. Venous ulcer of right leg (HCC) - Calcium Alginate dressing to right leg wound after cleaning with normal saline every 2 days for now and as needed in between until seen by wound care. Skin prep to surrounding area. Will need a waterproof  foam dressing over it.    Labs/tests ordered:   Lab Orders     CMP with eGFR([facility])     CBC with Differential/Platelets   Next appointment: 1 week follow up  Communication: reviewed care plan with patient, her daughter and IL charge nurse.     Blanchie Serve, MD Internal Medicine [facility] [address] [place], [place] [address] Cell Phone (Monday-Friday 8 am - 5 pm): [phone] On Call: [phone] and follow prompts after 5 pm and on weekends Office Phone: [phone] Office Fax: [phone]

## 2017-09-06 NOTE — Patient Instructions (Signed)
  You will get a call from [facility] about your wound care appointment.   Take antibiotic twice a day after meals for 10 days.  Take a cup of yoghurt daily with probiotics.

## 2017-09-06 NOTE — Telephone Encounter (Signed)
Per Dr. Bubba Camp patient is to come over to [name] to see Mia (wound care nurse) for her dressing change to her RLE due to the IL nurse not being here today.  Spoke with the patient about the instructions given by Dr. Bubba Camp. Patient verbalized understanding the instructions given and stated that she would head over to Mia's office right now. I left a voicemail for the daughter updating her on the situation.

## 2017-09-07 DIAGNOSIS — R41841 Cognitive communication deficit: Secondary | ICD-10-CM | POA: Diagnosis not present

## 2017-09-07 DIAGNOSIS — M25559 Pain in unspecified hip: Secondary | ICD-10-CM | POA: Diagnosis not present

## 2017-09-07 DIAGNOSIS — R2681 Unsteadiness on feet: Secondary | ICD-10-CM | POA: Diagnosis not present

## 2017-09-07 DIAGNOSIS — M25561 Pain in right knee: Secondary | ICD-10-CM | POA: Diagnosis not present

## 2017-09-07 DIAGNOSIS — M6281 Muscle weakness (generalized): Secondary | ICD-10-CM | POA: Diagnosis not present

## 2017-09-07 DIAGNOSIS — M79643 Pain in unspecified hand: Secondary | ICD-10-CM | POA: Diagnosis not present

## 2017-09-08 DIAGNOSIS — L089 Local infection of the skin and subcutaneous tissue, unspecified: Secondary | ICD-10-CM

## 2017-09-08 DIAGNOSIS — T148XXA Other injury of unspecified body region, initial encounter: Principal | ICD-10-CM

## 2017-09-08 LAB — COMPLETE METABOLIC PANEL WITH GFR
AG RATIO: 1.8 (calc) (ref 1.0–2.5)
ALT: 11 U/L (ref 6–29)
AST: 19 U/L (ref 10–35)
Albumin: 4.2 g/dL (ref 3.6–5.1)
Alkaline phosphatase (APISO): 58 U/L (ref 33–130)
BILIRUBIN TOTAL: 0.5 mg/dL (ref 0.2–1.2)
BUN/Creatinine Ratio: 25 (calc) — ABNORMAL HIGH (ref 6–22)
BUN: 26 mg/dL — ABNORMAL HIGH (ref 7–25)
CALCIUM: 9.9 mg/dL (ref 8.6–10.4)
CHLORIDE: 104 mmol/L (ref 98–110)
CO2: 29 mmol/L (ref 20–32)
Creat: 1.03 mg/dL — ABNORMAL HIGH (ref 0.60–0.88)
GFR, Est African American: 57 mL/min/{1.73_m2} — ABNORMAL LOW (ref >=60)
GFR, Est Non African American: 49 mL/min/{1.73_m2} — ABNORMAL LOW (ref >=60)
Globulin: 2.4 g/dL (calc) (ref 1.9–3.7)
Glucose, Bld: 72 mg/dL (ref 65–99)
POTASSIUM: 4.1 mmol/L (ref 3.5–5.3)
Sodium: 137 mmol/L (ref 135–146)
Total Protein: 6.6 g/dL (ref 6.1–8.1)

## 2017-09-08 LAB — CBC WITH DIFFERENTIAL/PLATELET
Basophils Absolute: 50 cells/uL (ref 0–200)
Basophils Relative: 0.8 %
Eosinophils Absolute: 273 cells/uL (ref 15–500)
Eosinophils Relative: 4.4 %
HCT: 32.7 % — ABNORMAL LOW (ref 35.0–45.0)
Hemoglobin: 11 g/dL — ABNORMAL LOW (ref 11.7–15.5)
Lymphs Abs: 1662 cells/uL (ref 850–3900)
MCH: 32.5 pg (ref 27.0–33.0)
MCHC: 33.6 g/dL (ref 32.0–36.0)
MCV: 96.7 fL (ref 80.0–100.0)
MONOS PCT: 8.2 %
MPV: 10.6 fL (ref 7.5–12.5)
Neutro Abs: 3708 cells/uL (ref 1500–7800)
Neutrophils Relative %: 59.8 %
PLATELETS: 249 10*3/uL (ref 140–400)
RBC: 3.38 10*6/uL — AB (ref 3.80–5.10)
RDW: 11.5 % (ref 11.0–15.0)
TOTAL LYMPHOCYTE: 26.8 %
WBC: 6.2 10*3/uL (ref 3.8–10.8)
WBCMIX: 508 {cells}/uL (ref 200–950)

## 2017-09-12 DIAGNOSIS — H02831 Dermatochalasis of right upper eyelid: Secondary | ICD-10-CM | POA: Diagnosis not present

## 2017-09-12 DIAGNOSIS — H01024 Squamous blepharitis left upper eyelid: Secondary | ICD-10-CM | POA: Diagnosis not present

## 2017-09-12 DIAGNOSIS — Z961 Presence of intraocular lens: Secondary | ICD-10-CM | POA: Diagnosis not present

## 2017-09-12 DIAGNOSIS — H01022 Squamous blepharitis right lower eyelid: Secondary | ICD-10-CM | POA: Diagnosis not present

## 2017-09-12 DIAGNOSIS — H02834 Dermatochalasis of left upper eyelid: Secondary | ICD-10-CM | POA: Diagnosis not present

## 2017-09-12 DIAGNOSIS — H02054 Trichiasis without entropian left upper eyelid: Secondary | ICD-10-CM | POA: Diagnosis not present

## 2017-09-12 DIAGNOSIS — H01021 Squamous blepharitis right upper eyelid: Secondary | ICD-10-CM | POA: Diagnosis not present

## 2017-09-12 DIAGNOSIS — H01025 Squamous blepharitis left lower eyelid: Secondary | ICD-10-CM | POA: Diagnosis not present

## 2017-09-12 DIAGNOSIS — G245 Blepharospasm: Secondary | ICD-10-CM | POA: Diagnosis not present

## 2017-09-13 ENCOUNTER — Non-Acute Institutional Stay: Payer: Medicare Other | Admitting: Internal Medicine

## 2017-09-13 ENCOUNTER — Encounter: Payer: Self-pay | Admitting: Internal Medicine

## 2017-09-13 VITALS — BP 118/64 | HR 80 | Temp 97.6°F | Resp 16 | Ht 63.0 in | Wt 109.2 lb

## 2017-09-13 DIAGNOSIS — L97919 Non-pressure chronic ulcer of unspecified part of right lower leg with unspecified severity: Secondary | ICD-10-CM | POA: Diagnosis not present

## 2017-09-13 DIAGNOSIS — L03115 Cellulitis of right lower limb: Secondary | ICD-10-CM

## 2017-09-13 DIAGNOSIS — I83019 Varicose veins of right lower extremity with ulcer of unspecified site: Secondary | ICD-10-CM

## 2017-09-13 NOTE — Patient Instructions (Signed)
  Continue with your dressing change every other day for now. If dressing is soaked or you notice excess drainage, talk to your nurse right away. If you develop fever or chills, let our office know.   Keep your self hydrated.  On your next visit, bring your living will and health care power of attorney paperwork for review.

## 2017-09-13 NOTE — Progress Notes (Signed)
[facility]  Provider: Blanchie Serve MD   Location:      Place of Service:     PCP: Blanchie Serve, MD Patient Care Team: Blanchie Serve, MD as PCP - General (Internal Medicine) Mast, Man X, NP as Nurse Practitioner (Internal Medicine)  Extended Emergency Contact Information Primary Emergency Contact: Cinelli,Floyd Address: [place].          [address]          [place], [place] [address] [place] Phone: [phone] Relation: Spouse Secondary Emergency Contact: Mccraw,[name] Address: [place], [place] [address] [name] of [place] Phone: [phone] Work Phone: [phone] Mobile Phone: [phone] Relation: Daughter   Goals of Care: Advanced Directive information Advanced Directives 05/19/2016  Does Patient Have a Medical Advance Directive? Yes  Type of Advance Directive -  Does patient want to make changes to medical advance directive? No - Patient declined     Chief Complaint  Patient presents with  . Acute Visit    follow up on RLE wound  . Medication Refill    No refills needed at this time  . Results    Discuss labs    HPI: Patient is a 84 y.o. female seen today for acute visit for follow up on RLE wound. She was placed on antibiotic last week with purulent drainage from wound. Her dressing was changed to calcium alginate and skin prep to peri wound area with waterproof dressing. She is seen today. The [place] nurse does her dressing change. Patient denies any fever. Per nursing, her drainage has decreased. Patient denies any pain. She is currently taking her antibiotic.  Past Medical History:  Diagnosis Date  . Hypertension    History reviewed. No pertinent surgical history.  reports that she has quit smoking. She quit after 12.00 years of use. She has never used smokeless tobacco. She reports that she drinks about 4.2 oz of alcohol per week. She reports  that she does not use drugs. Social History   Socioeconomic History  . Marital status: Married    Spouse name: Tyrone Nine  . Number of children: 2  . Years of education: Not on file  . Highest education level: Not on file  Occupational History  . Occupation: Pharmacist, hospital    Comment: retired  Scientific laboratory technician  . Financial resource strain: Not on file  . Food insecurity:    Worry: Not on file    Inability: Not on file  . Transportation needs:    Medical: Not on file    Non-medical: Not on file  Tobacco Use  . Smoking status: Former Smoker    Years: 12.00  . Smokeless tobacco: Never Used  Substance and Sexual Activity  . Alcohol use: Yes    Alcohol/week: 4.2 oz    Types: 7 Glasses of wine per week  . Drug use: No  . Sexual activity: Not on file  Lifestyle  . Physical activity:    Days per week: Not on file    Minutes per session: Not on file  . Stress: Not on file  Relationships  . Social connections:    Talks on phone: Not on file    Gets together: Not on file    Attends religious service: Not on file    Active member of club or organization: Not on file  Attends meetings of clubs or organizations: Not on file    Relationship status: Not on file  . Intimate partner violence:    Fear of current or ex partner: Not on file    Emotionally abused: Not on file    Physically abused: Not on file    Forced sexual activity: Not on file  Other Topics Concern  . Not on file  Social History Narrative   Social History     Socioeconomic History       Marital status: Married   09/01/1954       Spouse name: Tyrone Nine   Two people stay in the home on the fourth floor       Number of children: 2       Years of education:       Highest education level: Not on file    Exercise: yoga, walking     Social Needs       Financial resource strain: Not on file       Food insecurity - worry: Not on file       Food insecurity - inability: Not on file       Transportation needs - medical: Not on file        Transportation needs - non-medical: Not on file     Occupational History       Not on file     Tobacco Use       Smoking status: Never Smoker       Smokeless tobacco: Never Used     Substance and Sexual Activity       Alcohol use: No       Drug use: No       Sexual activity: Not on file     Other Topics       Concerns:         Not on file     Social History Narrative       Not on file     Family History  Problem Relation Age of Onset  . Heart failure Mother   . Heart disease Father   . Arthritis Sister     Health Maintenance  Topic Date Due  . Samul Dada  05/04/1948  . DEXA SCAN  05/04/1996  . INFLUENZA VACCINE  01/12/2018  . PNA vac Low Risk Adult (2 of 2 - PCV13) 06/14/2018    Allergies  Allergen Reactions  . Ace Inhibitors   . Flagyl [Metronidazole]   . Penicillins     Outpatient Encounter Medications as of 09/13/2017  Medication Sig  . Ascorbic Acid (VITAMIN C) 1000 MG tablet Take 1,000 mg by mouth daily.  Marland Kitchen aspirin EC 81 MG tablet Take 81 mg by mouth daily.  . Calcium Alginate (ALGICELL CALCIUM DRESSING 2"X2) MISC Cleanse wound area with normal saline and pat dry. Apply algicell calcium dressing to right leg wound and change dressing every 3 days and as needed.  . Calcium Carb-Cholecalciferol (CALCIUM 500 +D) 500-400 MG-UNIT TABS Take 1 tablet by mouth daily.  Marland Kitchen doxycycline (VIBRA-TABS) 100 MG tablet Take 1 tablet (100 mg total) by mouth 2 (two) times daily.  . Multiple Vitamin (MULTIVITAMIN WITH MINERALS) TABS tablet Take 1 tablet by mouth daily.  Marland Kitchen omega-3 acid ethyl esters (LOVAZA) 1 g capsule Take 1 g by mouth daily.  . valsartan (DIOVAN) 160 MG tablet Take 160 mg by mouth daily.   No facility-administered encounter medications on file as of 09/13/2017.     Review  of Systems  Constitutional: Negative for chills and fever.  Cardiovascular: Negative for chest pain.       No worsening of leg edema  Gastrointestinal: Negative for abdominal pain,  diarrhea, nausea and vomiting.  Skin: Positive for wound.  Psychiatric/Behavioral: Negative for behavioral problems and confusion.    Vitals:   09/13/17 0835  BP: 118/64  Pulse: 80  Resp: 16  Temp: 97.6 F (36.4 C)  TempSrc: Oral  SpO2: 97%  Weight: 109 lb 3.2 oz (49.5 kg)  Height: 5\' 3"  (1.6 m)   Body mass index is 19.34 kg/m.   Wt Readings from Last 3 Encounters:  09/13/17 109 lb 3.2 oz (49.5 kg)  09/06/17 111 lb 9.6 oz (50.6 kg)  08/23/17 112 lb 6.4 oz (51 kg)   Physical Exam  Constitutional: She is oriented to person, place, and time. No distress.  Thin built, frail, elderly female  HENT:  Head: Normocephalic and atraumatic.  Mouth/Throat: Oropharynx is clear and moist.  Neck: Neck supple.  Cardiovascular: Normal rate and regular rhythm.  Pulmonary/Chest: Effort normal and breath sounds normal.  Abdominal: Soft.  Musculoskeletal:  Trace edema to both legs, right leg open wound with irregular border and hypergranulation, some seroanginous drainage to dressing, no active drainage, size 1.5 x 2 cm, non tender, good dorsalis pedis  Neurological: She is alert and oriented to person, place, and time.  Skin: Skin is warm and dry. She is not diaphoretic.  Psychiatric: She has a normal mood and affect.    Labs reviewed: Basic Metabolic Panel: Recent Labs    08/16/17 0705 09/08/17 0730  NA 138 137  K 4.5 4.1  CL 104 104  CO2 30 29  GLUCOSE 88 72  BUN 29* 26*  CREATININE 1.05* 1.03*  CALCIUM 9.7 9.9   Liver Function Tests: Recent Labs    08/16/17 0705 09/08/17 0730  AST 19 19  ALT 11 11  BILITOT 0.5 0.5  PROT 6.8 6.6   No results for input(s): LIPASE, AMYLASE in the last 8760 hours. No results for input(s): AMMONIA in the last 8760 hours. CBC: Recent Labs    08/16/17 0705 09/08/17 0730  WBC 6.4 6.2  NEUTROABS 3,507 3,708  HGB 11.4* 11.0*  HCT 33.1* 32.7*  MCV 94.8 96.7  PLT 305 249   Cardiac Enzymes: No results for input(s): CKTOTAL, CKMB,  CKMBINDEX, TROPONINI in the last 8760 hours. BNP: Invalid input(s): POCBNP No results found for: HGBA1C No results found for: TSH No results found for: VITAMINB12 No results found for: FOLATE No results found for: IRON, TIBC, FERRITIN  Lipid Panel: Recent Labs    08/16/17 0705  CHOL 159  HDL 73  LDLCALC 73  TRIG 53  CHOLHDL 2.2   No results found for: HGBA1C  Procedures since last visit: No results found.  Assessment/Plan  1. Cellulitis of right leg Improving, continue and complete course of doxycycline and continue yoghurt to prevent diarrhea. Monitor for fever, pain, increased redness to leg or increased drainage from wound. Reviewed cbc and bmp, stable.   2. Venous ulcer of right leg ([place]) Has follow up in wound care clinic 09/19/17. Improved with decrease in wound size and drainage. Continue calcium alginate dressing, periwound skin prep and waterproof dressing every other day for now until seen in wound clinic. Right TBI normal.     Labs/tests ordered:  none  Next appointment: 4 weeks for routine visit.   Communication: reviewed care plan with patient and IL nurse.  Blanchie Serve, MD Internal Medicine [facility] [address] [place], [place] [address] Cell Phone (Monday-Friday 8 am - 5 pm): [phone] On Call: [phone] and follow prompts after 5 pm and on weekends Office Phone: [phone] Office Fax: [phone]

## 2017-09-19 ENCOUNTER — Encounter (HOSPITAL_BASED_OUTPATIENT_CLINIC_OR_DEPARTMENT_OTHER): Payer: Medicare Other | Attending: Internal Medicine

## 2017-09-19 DIAGNOSIS — I10 Essential (primary) hypertension: Secondary | ICD-10-CM | POA: Diagnosis not present

## 2017-09-19 DIAGNOSIS — L97811 Non-pressure chronic ulcer of other part of right lower leg limited to breakdown of skin: Secondary | ICD-10-CM | POA: Insufficient documentation

## 2017-09-19 DIAGNOSIS — I87311 Chronic venous hypertension (idiopathic) with ulcer of right lower extremity: Secondary | ICD-10-CM | POA: Diagnosis not present

## 2017-09-19 DIAGNOSIS — L97212 Non-pressure chronic ulcer of right calf with fat layer exposed: Secondary | ICD-10-CM | POA: Diagnosis not present

## 2017-09-19 DIAGNOSIS — Z87891 Personal history of nicotine dependence: Secondary | ICD-10-CM | POA: Diagnosis not present

## 2017-10-02 DIAGNOSIS — S82001A Unspecified fracture of right patella, initial encounter for closed fracture: Secondary | ICD-10-CM | POA: Diagnosis not present

## 2017-10-03 DIAGNOSIS — S82034A Nondisplaced transverse fracture of right patella, initial encounter for closed fracture: Secondary | ICD-10-CM | POA: Diagnosis not present

## 2017-10-05 DIAGNOSIS — M6281 Muscle weakness (generalized): Secondary | ICD-10-CM | POA: Diagnosis not present

## 2017-10-05 DIAGNOSIS — Z9181 History of falling: Secondary | ICD-10-CM | POA: Diagnosis not present

## 2017-10-05 DIAGNOSIS — S82001D Unspecified fracture of right patella, subsequent encounter for closed fracture with routine healing: Secondary | ICD-10-CM | POA: Diagnosis not present

## 2017-10-05 DIAGNOSIS — R2681 Unsteadiness on feet: Secondary | ICD-10-CM | POA: Diagnosis not present

## 2017-10-06 DIAGNOSIS — L97811 Non-pressure chronic ulcer of other part of right lower leg limited to breakdown of skin: Secondary | ICD-10-CM | POA: Diagnosis not present

## 2017-10-06 DIAGNOSIS — I87311 Chronic venous hypertension (idiopathic) with ulcer of right lower extremity: Secondary | ICD-10-CM | POA: Diagnosis not present

## 2017-10-06 DIAGNOSIS — Z87891 Personal history of nicotine dependence: Secondary | ICD-10-CM | POA: Diagnosis not present

## 2017-10-06 DIAGNOSIS — I10 Essential (primary) hypertension: Secondary | ICD-10-CM | POA: Diagnosis not present

## 2017-10-07 DIAGNOSIS — S82001D Unspecified fracture of right patella, subsequent encounter for closed fracture with routine healing: Secondary | ICD-10-CM | POA: Diagnosis not present

## 2017-10-07 DIAGNOSIS — Z9181 History of falling: Secondary | ICD-10-CM | POA: Diagnosis not present

## 2017-10-07 DIAGNOSIS — M6281 Muscle weakness (generalized): Secondary | ICD-10-CM | POA: Diagnosis not present

## 2017-10-07 DIAGNOSIS — R2681 Unsteadiness on feet: Secondary | ICD-10-CM | POA: Diagnosis not present

## 2017-10-10 DIAGNOSIS — S82001D Unspecified fracture of right patella, subsequent encounter for closed fracture with routine healing: Secondary | ICD-10-CM | POA: Diagnosis not present

## 2017-10-10 DIAGNOSIS — R2681 Unsteadiness on feet: Secondary | ICD-10-CM | POA: Diagnosis not present

## 2017-10-10 DIAGNOSIS — Z9181 History of falling: Secondary | ICD-10-CM | POA: Diagnosis not present

## 2017-10-10 DIAGNOSIS — M6281 Muscle weakness (generalized): Secondary | ICD-10-CM | POA: Diagnosis not present

## 2017-10-11 ENCOUNTER — Encounter: Payer: Self-pay | Admitting: Internal Medicine

## 2017-10-11 ENCOUNTER — Ambulatory Visit: Payer: Medicare Other | Admitting: Internal Medicine

## 2017-10-11 VITALS — BP 122/72 | HR 91 | Temp 97.9°F | Resp 16 | Ht 63.0 in | Wt 120.6 lb

## 2017-10-11 DIAGNOSIS — Z9181 History of falling: Secondary | ICD-10-CM

## 2017-10-11 DIAGNOSIS — I10 Essential (primary) hypertension: Secondary | ICD-10-CM

## 2017-10-11 DIAGNOSIS — N183 Chronic kidney disease, stage 3 unspecified: Secondary | ICD-10-CM | POA: Insufficient documentation

## 2017-10-11 DIAGNOSIS — Z7189 Other specified counseling: Secondary | ICD-10-CM | POA: Diagnosis not present

## 2017-10-11 DIAGNOSIS — I872 Venous insufficiency (chronic) (peripheral): Secondary | ICD-10-CM | POA: Diagnosis not present

## 2017-10-11 DIAGNOSIS — S82041D Displaced comminuted fracture of right patella, subsequent encounter for closed fracture with routine healing: Secondary | ICD-10-CM

## 2017-10-11 DIAGNOSIS — M6281 Muscle weakness (generalized): Secondary | ICD-10-CM | POA: Diagnosis not present

## 2017-10-11 DIAGNOSIS — R2681 Unsteadiness on feet: Secondary | ICD-10-CM | POA: Diagnosis not present

## 2017-10-11 DIAGNOSIS — S82034A Nondisplaced transverse fracture of right patella, initial encounter for closed fracture: Secondary | ICD-10-CM | POA: Diagnosis not present

## 2017-10-11 DIAGNOSIS — S82001D Unspecified fracture of right patella, subsequent encounter for closed fracture with routine healing: Secondary | ICD-10-CM | POA: Diagnosis not present

## 2017-10-11 DIAGNOSIS — N184 Chronic kidney disease, stage 4 (severe): Secondary | ICD-10-CM | POA: Insufficient documentation

## 2017-10-11 NOTE — ACP (Advance Care Planning) (Signed)
   Goals of care discussion Reviewed goals of care with patient with patient, nurse who is AL co-ordinator Tywan and myself present. Reviewed current documents. Patient has a living will and HCPOA paperwork. copies of HCPOA present in chart. Copy if living will requested. Patient has a DNR form filled out today. MOST form reviewed and filled out today. Patient would like to remain a DNR in absence of pulse or breath. Patient would like hospitalization with limited interventions if a medical need arises with comfort being the main goal. Patient agrees to iv fluids for a defined trial period. Patient would like antibiotic if indicated for a trial period. Patient does not want a feeding tube for nutritional support. Form filled out and reviewed and signed. I have spent time from 11:45 am- 12:05 am reviewing goals of care today. All questions from patient and/or family member have been answered to the best of my knowledge.

## 2017-10-11 NOTE — Patient Instructions (Signed)
  You are being advised to be admitted to ALF due to increased level of care with recent fracture and wound to right leg.

## 2017-10-11 NOTE — Progress Notes (Signed)
[facility]  Provider: Blanchie Serve MD   Location:      Place of Service:     PCP: Blanchie Serve, MD Patient Care Team: Blanchie Serve, MD as PCP - General (Internal Medicine) Mast, Man X, NP as Nurse Practitioner (Internal Medicine)  Extended Emergency Contact Information Primary Emergency Contact: Klemmer,Floyd Address: [address]          [place], [place] [address] [place] Phone: [phone] Relation: Spouse Secondary Emergency Contact: Schwegler,[name] Address: [place], [place] [address] [name] Phone: [phone] Work Phone: [phone] Mobile Phone: [phone] Relation: Daughter  Code Status: DNR  Goals of Care: Advanced Directive information Advanced Directives 10/11/2017  Does Patient Have a Medical Advance Directive? Yes  Type of Paramedic of [place];Living will  Does patient want to make changes to medical advance directive? Yes (MAU/Ambulatory/Procedural Areas - Information given)  Copy of [place] in Chart? Yes    Chief Complaint  Patient presents with  . Acute Visit    goals of care discussion, left patella fracture    HPI: Patient is a 84 y.o. female seen today for acute visit for goals of care discussion. She had a fall on 10/01/17 and fractured her right patella, seen in urgent care, fracture was confirmed with xray showing minimally displaced comminuted fracture of right patella. She was seen by Dr Gladstone Lighter on 10/03/17 and she was provided with an immobilizer and was advised to bear weight as tolerated to right LE. Note from 10/03/17 from Dr Gladstone Lighter reviewed in care everywhere. She is currently on baby aspirin. Patient is to be seen by orthopedic service today for follow up.   Past Medical History:  Diagnosis Date  . Hypertension    History reviewed. No pertinent surgical  history.  reports that she has quit smoking. She quit after 12.00 years of use. She has never used smokeless tobacco. She reports that she drinks about 4.2 oz of alcohol per week. She reports that she does not use drugs. Social History   Socioeconomic History  . Marital status: Married    Spouse name: Tyrone Nine  . Number of children: 2  . Years of education: Not on file  . Highest education level: Not on file  Occupational History  . Occupation: Pharmacist, hospital    Comment: retired  Scientific laboratory technician  . Financial resource strain: Not on file  . Food insecurity:    Worry: Not on file    Inability: Not on file  . Transportation needs:    Medical: Not on file    Non-medical: Not on file  Tobacco Use  . Smoking status: Former Smoker    Years: 12.00  . Smokeless tobacco: Never Used  Substance and Sexual Activity  . Alcohol use: Yes    Alcohol/week: 4.2 oz    Types: 7 Glasses of wine per week  . Drug use: No  . Sexual activity: Not on file  Lifestyle  . Physical activity:    Days per week: Not on file    Minutes per session: Not on file  . Stress: Not on file  Relationships  . Social connections:    Talks on phone: Not on file    Gets together: Not on file  Attends religious service: Not on file    Active member of club or organization: Not on file    Attends meetings of clubs or organizations: Not on file    Relationship status: Not on file  . Intimate partner violence:    Fear of current or ex partner: Not on file    Emotionally abused: Not on file    Physically abused: Not on file    Forced sexual activity: Not on file  Other Topics Concern  . Not on file  Social History Narrative   Social History     Socioeconomic History       Marital status: Married   09/01/1954       Spouse name: Tyrone Nine   Two people stay in the home on the fourth floor       Number of children: 2       Years of education:       Highest education level: Not on file    Exercise: yoga, walking     Social  Needs       Financial resource strain: Not on file       Food insecurity - worry: Not on file       Food insecurity - inability: Not on file       Transportation needs - medical: Not on file       Transportation needs - non-medical: Not on file     Occupational History       Not on file     Tobacco Use       Smoking status: Never Smoker       Smokeless tobacco: Never Used     Substance and Sexual Activity       Alcohol use: No       Drug use: No       Sexual activity: Not on file     Other Topics       Concerns:         Not on file     Social History Narrative       Not on file     Family History  Problem Relation Age of Onset  . Heart failure Mother   . Heart disease Father   . Arthritis Sister     Health Maintenance  Topic Date Due  . Samul Dada  [birth year]1950  . DEXA SCAN  05/04/1996  . INFLUENZA VACCINE  01/12/2018  . PNA vac Low Risk Adult (2 of 2 - PCV13) 06/14/2018    Allergies  Allergen Reactions  . Ace Inhibitors   . Flagyl [Metronidazole]   . Penicillins     Outpatient Encounter Medications as of 10/11/2017  Medication Sig  . acetaminophen (TYLENOL) 325 MG tablet Take 650 mg by mouth every 4 (four) hours as needed for mild pain or headache.  Marland Kitchen aspirin EC 81 MG tablet Take 81 mg by mouth daily.  . Calcium Carb-Cholecalciferol (CALCIUM 500 +D) 500-400 MG-UNIT TABS Take 1 tablet by mouth daily.  . cholecalciferol (VITAMIN D) 1000 units tablet Take 2,000 Units by mouth daily.  . Multiple Vitamin (MULTIVITAMIN WITH MINERALS) TABS tablet Take 1 tablet by mouth daily.  Marland Kitchen UNABLE TO FIND every other day. Med Name: Silver Dressing. Cleanse wound to lower right leg with normal saline. Then cleanse the area with wound skin prep. Allow to dry. Cut and place a piece of Kerra Cel (Silver dressing) to the wound. Then cover with a Tefla self adhesive dressing.  . valsartan (  DIOVAN) 160 MG tablet Take 160 mg by mouth daily.  . vitamin C (ASCORBIC ACID) 250 MG tablet Take  250 mg by mouth daily.  . [DISCONTINUED] Calcium Alginate (ALGICELL CALCIUM DRESSING 2"X2) MISC Cleanse wound area with normal saline and pat dry. Apply algicell calcium dressing to right leg wound and change dressing every 3 days and as needed.  . [DISCONTINUED] Ascorbic Acid (VITAMIN C) 1000 MG tablet Take 1,000 mg by mouth daily.  . [DISCONTINUED] doxycycline (VIBRA-TABS) 100 MG tablet Take 1 tablet (100 mg total) by mouth 2 (two) times daily.  . [DISCONTINUED] omega-3 acid ethyl esters (LOVAZA) 1 g capsule Take 1 g by mouth daily.   No facility-administered encounter medications on file as of 10/11/2017.     Review of Systems  Constitutional: Negative for appetite change and fever.  Respiratory: Negative for shortness of breath and wheezing.   Cardiovascular: Negative for chest pain and palpitations.  Musculoskeletal: Positive for gait problem. Negative for back pain.       Denies pain to right leg, had a fall with right patella fracture on 10/01/17  Skin: Positive for wound. Negative for rash.  Neurological: Negative for dizziness and headaches.  Psychiatric/Behavioral: Positive for decreased concentration. Negative for confusion, sleep disturbance and suicidal ideas.    Vitals:   10/11/17 1135  BP: 122/72  Pulse: 91  Resp: 16  Temp: 97.9 F (36.6 C)  TempSrc: Oral  SpO2: 97%  Weight: 120 lb 9.6 oz (54.7 kg)  Height: 5\' 3"  (1.6 m)   Body mass index is 21.36 kg/m. Physical Exam  Constitutional: She is oriented to person, place, and time.  Thin built and frail elderly female in no acute distress  HENT:  Head: Normocephalic and atraumatic.  Mouth/Throat: Oropharynx is clear and moist.  Eyes: Pupils are equal, round, and reactive to light. EOM are normal.  Neck: Normal range of motion. Neck supple.  Cardiovascular: Normal rate and regular rhythm.  Pulmonary/Chest: Effort normal and breath sounds normal. No respiratory distress. She has no wheezes. She has no rales.    Abdominal: Soft. Bowel sounds are normal. There is no tenderness.  Musculoskeletal:  Right leg in immobilizer, wound to right leg with dressing in place, clean and dry, trace leg edema and chronic stasis changes  Lymphadenopathy:    She has no cervical adenopathy.  Neurological: She is alert and oriented to person, place, and time.  Skin: Skin is warm and dry. She is not diaphoretic.  Psychiatric: She has a normal mood and affect.    Labs reviewed: Basic Metabolic Panel: Recent Labs    08/16/17 0705 09/08/17 0730  NA 138 137  K 4.5 4.1  CL 104 104  CO2 30 29  GLUCOSE 88 72  BUN 29* 26*  CREATININE 1.05* 1.03*  CALCIUM 9.7 9.9   Liver Function Tests: Recent Labs    08/16/17 0705 09/08/17 0730  AST 19 19  ALT 11 11  BILITOT 0.5 0.5  PROT 6.8 6.6   No results for input(s): LIPASE, AMYLASE in the last 8760 hours. No results for input(s): AMMONIA in the last 8760 hours. CBC: Recent Labs    08/16/17 0705 09/08/17 0730  WBC 6.4 6.2  NEUTROABS 3,507 3,708  HGB 11.4* 11.0*  HCT 33.1* 32.7*  MCV 94.8 96.7  PLT 305 249   Cardiac Enzymes: No results for input(s): CKTOTAL, CKMB, CKMBINDEX, TROPONINI in the last 8760 hours. BNP: Invalid input(s): POCBNP No results found for: HGBA1C No results found for: TSH  No results found for: VITAMINB12 No results found for: FOLATE No results found for: IRON, TIBC, FERRITIN  Lipid Panel: Recent Labs    08/16/17 0705  CHOL 159  HDL 73  LDLCALC 73  TRIG 53  CHOLHDL 2.2   No results found for: HGBA1C  Procedures since last visit: No results found.  Assessment/Plan  1. Closed displaced comminuted fracture of right patella with routine healing, subsequent encounter Seen by orthopedic and has immobilizer in place. WBAT to RLE. Seeing orthopedic today for follow up. Fall precautions. Will need increased assistance with ADLs and will recommend admission to ALF for same. Continue calcium and vitamin d supplement. Continue  baby aspirin for now with limited mobility.   2. Venous insufficiency (chronic) (peripheral) Continue wound care with silver dressing every other day for now. Continue skin care.  3. Goals of care, counseling/discussion Last ACP Note 07/13/2017 to 10/11/2017       ACP (Advance Care Planning) by Blanchie Serve, MD at 10/11/2017 11:30 AM    Date of Service   Author Author Type Status Note Type File Time  10/11/2017 Addend Blanchie Serve, MD Physician Signed ACP (Advance Care Planning) 10/11/2017               Goals of care discussion Reviewed goals of care with patient with patient, nurse who is AL co-ordinator Tywan and myself present. Reviewed current documents. Patient has a living will and HCPOA paperwork. copies of HCPOA present in chart. Copy if living will requested. Patient has a DNR form filled out today. MOST form reviewed and filled out today. Patient would like to remain a DNR in absence of pulse or breath. Patient would like hospitalization with limited interventions if a medical need arises with comfort being the main goal. Patient agrees to iv fluids for a defined trial period. Patient would like antibiotic if indicated for a trial period. Patient does not want a feeding tube for nutritional support. Form filled out and reviewed and signed. I have spent time from 11:45 am- 12:05 am reviewing goals of care today. All questions from patient and/or family member have been answered to the best of my knowledge.                 4. At high risk for injury related to fall To use walker, PT/OT consult.   5. Unsteady gait Will have her work with physical therapy and occupational therapy team to help with gait training and muscle strengthening exercises.fall precautions. Skin care. Encourage to be out of bed.   6. Essential hypertension Continue valsartan 160 mg daily. Check bp bid for now for 1 week, then daily.   Labs/tests ordered:  CMP, CBC with diff 10/13/17. FL2 form filled out  and signed. Admission order provided.   Next appointment: follow up in ALF  Communication: reviewed care plan with patient and charge nurse.     Blanchie Serve, MD Internal Medicine [facility] [address] [place], [place] [address] Cell Phone (Monday-Friday 8 am - 5 pm): [phone] On Call: [phone] and follow prompts after 5 pm and on weekends Office Phone: [phone] Office Fax: [phone]

## 2017-10-12 DIAGNOSIS — Z9181 History of falling: Secondary | ICD-10-CM | POA: Diagnosis not present

## 2017-10-12 DIAGNOSIS — M6281 Muscle weakness (generalized): Secondary | ICD-10-CM | POA: Diagnosis not present

## 2017-10-12 DIAGNOSIS — Z7389 Other problems related to life management difficulty: Secondary | ICD-10-CM | POA: Diagnosis not present

## 2017-10-12 DIAGNOSIS — N3946 Mixed incontinence: Secondary | ICD-10-CM | POA: Diagnosis not present

## 2017-10-12 DIAGNOSIS — S82001D Unspecified fracture of right patella, subsequent encounter for closed fracture with routine healing: Secondary | ICD-10-CM | POA: Diagnosis not present

## 2017-10-12 DIAGNOSIS — R2681 Unsteadiness on feet: Secondary | ICD-10-CM | POA: Diagnosis not present

## 2017-10-12 DIAGNOSIS — R278 Other lack of coordination: Secondary | ICD-10-CM | POA: Diagnosis not present

## 2017-10-13 ENCOUNTER — Other Ambulatory Visit: Payer: Self-pay

## 2017-10-13 DIAGNOSIS — N3946 Mixed incontinence: Secondary | ICD-10-CM | POA: Diagnosis not present

## 2017-10-13 DIAGNOSIS — Z7389 Other problems related to life management difficulty: Secondary | ICD-10-CM | POA: Diagnosis not present

## 2017-10-13 DIAGNOSIS — M6281 Muscle weakness (generalized): Secondary | ICD-10-CM | POA: Diagnosis not present

## 2017-10-13 DIAGNOSIS — Z9181 History of falling: Secondary | ICD-10-CM | POA: Diagnosis not present

## 2017-10-13 DIAGNOSIS — I872 Venous insufficiency (chronic) (peripheral): Secondary | ICD-10-CM | POA: Diagnosis not present

## 2017-10-13 DIAGNOSIS — R2681 Unsteadiness on feet: Secondary | ICD-10-CM | POA: Diagnosis not present

## 2017-10-13 DIAGNOSIS — R278 Other lack of coordination: Secondary | ICD-10-CM | POA: Diagnosis not present

## 2017-10-13 LAB — CBC AND DIFFERENTIAL
HEMATOCRIT: 28 — AB (ref 36–46)
HEMOGLOBIN: 9.2 — AB (ref 12.0–16.0)
PLATELETS: 285 (ref 150–399)
WBC: 5.2

## 2017-10-13 LAB — BASIC METABOLIC PANEL
BUN: 27 — AB (ref 4–21)
Creatinine: 1 (ref 0.5–1.1)
Glucose: 71
Potassium: 4.2 (ref 3.4–5.3)
Sodium: 138 (ref 137–147)

## 2017-10-13 LAB — HEPATIC FUNCTION PANEL
ALT: 14 (ref 7–35)
AST: 18 (ref 13–35)
Alkaline Phosphatase: 60 (ref 25–125)
Bilirubin, Total: 0.4

## 2017-10-17 DIAGNOSIS — D649 Anemia, unspecified: Secondary | ICD-10-CM | POA: Diagnosis not present

## 2017-10-17 DIAGNOSIS — Z7389 Other problems related to life management difficulty: Secondary | ICD-10-CM | POA: Diagnosis not present

## 2017-10-17 DIAGNOSIS — R278 Other lack of coordination: Secondary | ICD-10-CM | POA: Diagnosis not present

## 2017-10-17 DIAGNOSIS — R2681 Unsteadiness on feet: Secondary | ICD-10-CM | POA: Diagnosis not present

## 2017-10-17 DIAGNOSIS — M6281 Muscle weakness (generalized): Secondary | ICD-10-CM | POA: Diagnosis not present

## 2017-10-17 DIAGNOSIS — E440 Moderate protein-calorie malnutrition: Secondary | ICD-10-CM | POA: Diagnosis not present

## 2017-10-17 DIAGNOSIS — Z9181 History of falling: Secondary | ICD-10-CM | POA: Diagnosis not present

## 2017-10-17 DIAGNOSIS — N3946 Mixed incontinence: Secondary | ICD-10-CM | POA: Diagnosis not present

## 2017-10-17 LAB — IRON,TIBC AND FERRITIN PANEL: FERRITIN: 121

## 2017-10-17 LAB — CBC AND DIFFERENTIAL
HEMATOCRIT: 27 — AB (ref 36–46)
Hemoglobin: 9.2 — AB (ref 12.0–16.0)
PLATELETS: 280 (ref 150–399)
WBC: 6

## 2017-10-18 ENCOUNTER — Other Ambulatory Visit: Payer: Self-pay | Admitting: *Deleted

## 2017-10-18 DIAGNOSIS — M6281 Muscle weakness (generalized): Secondary | ICD-10-CM | POA: Diagnosis not present

## 2017-10-18 DIAGNOSIS — Z9181 History of falling: Secondary | ICD-10-CM | POA: Diagnosis not present

## 2017-10-18 DIAGNOSIS — Z7389 Other problems related to life management difficulty: Secondary | ICD-10-CM | POA: Diagnosis not present

## 2017-10-18 DIAGNOSIS — R2681 Unsteadiness on feet: Secondary | ICD-10-CM | POA: Diagnosis not present

## 2017-10-18 DIAGNOSIS — N3946 Mixed incontinence: Secondary | ICD-10-CM | POA: Diagnosis not present

## 2017-10-18 DIAGNOSIS — S82034A Nondisplaced transverse fracture of right patella, initial encounter for closed fracture: Secondary | ICD-10-CM | POA: Diagnosis not present

## 2017-10-18 DIAGNOSIS — R278 Other lack of coordination: Secondary | ICD-10-CM | POA: Diagnosis not present

## 2017-10-20 ENCOUNTER — Non-Acute Institutional Stay: Payer: Medicare Other

## 2017-10-20 DIAGNOSIS — Z9181 History of falling: Secondary | ICD-10-CM | POA: Diagnosis not present

## 2017-10-20 DIAGNOSIS — R2681 Unsteadiness on feet: Secondary | ICD-10-CM | POA: Diagnosis not present

## 2017-10-20 DIAGNOSIS — R278 Other lack of coordination: Secondary | ICD-10-CM | POA: Diagnosis not present

## 2017-10-20 DIAGNOSIS — Z7389 Other problems related to life management difficulty: Secondary | ICD-10-CM | POA: Diagnosis not present

## 2017-10-20 DIAGNOSIS — N3946 Mixed incontinence: Secondary | ICD-10-CM | POA: Diagnosis not present

## 2017-10-20 DIAGNOSIS — M6281 Muscle weakness (generalized): Secondary | ICD-10-CM | POA: Diagnosis not present

## 2017-10-20 DIAGNOSIS — Z0000 Encounter for general adult medical examination without abnormal findings: Secondary | ICD-10-CM | POA: Diagnosis not present

## 2017-10-20 NOTE — Progress Notes (Signed)
Subjective:   Peggy [name] is a 84 y.o. female who presents for Medicare Annual (Subsequent) preventive examination at [facility]  Last AWV-10/05/2016    Objective:     Vitals: BP 120/70 (BP Location: Left Arm, Patient Position: Sitting)   Pulse 85   Temp 98.1 F (36.7 C) (Oral)   Ht 5\' 3"  (1.6 m)   Wt 121 lb (54.9 kg)   BMI 21.43 kg/m   Body mass index is 21.43 kg/m.  Advanced Directives 10/20/2017 10/11/2017 05/19/2016 05/18/2016 05/17/2016  Does Patient Have a Medical Advance Directive? Yes Yes Yes Yes Yes  Type of Paramedic of [place];Living will [place];Living will - Living will;Healthcare Power of [place];Out of facility DNR (pink MOST or yellow form) -  Does patient want to make changes to medical advance directive? No - Patient declined Yes (MAU/Ambulatory/Procedural Areas - Information given) No - Patient declined - -  Copy of [place] in Chart? Yes Yes - - -    Tobacco Social History   Tobacco Use  Smoking Status Former Smoker  . Years: 12.00  Smokeless Tobacco Never Used     Counseling given: Not Answered   Clinical Intake:  Pre-visit preparation completed: No  Pain : No/denies pain     Nutritional Risks: None Diabetes: No  How often do you need to have someone help you when you read instructions, pamphlets, or other written materials from your doctor or pharmacy?: 1 - Never  Interpreter Needed?: No  Information entered by :: Rayetta Humphrey, RN  Past Medical History:  Diagnosis Date  . Hypertension    No past surgical history on file. Family History  Problem Relation Age of Onset  . Heart failure Mother   . Heart disease Father   . Arthritis Sister    Social History   Socioeconomic History  . Marital status: Married    Spouse name: Tyrone Nine  . Number of children: 2  . Years of education: Not on file  . Highest education level: Not on file    Occupational History  . Occupation: Pharmacist, hospital    Comment: retired  Scientific laboratory technician  . Financial resource strain: Not hard at all  . Food insecurity:    Worry: Never true    Inability: Never true  . Transportation needs:    Medical: No    Non-medical: No  Tobacco Use  . Smoking status: Former Smoker    Years: 12.00  . Smokeless tobacco: Never Used  Substance and Sexual Activity  . Alcohol use: Not Currently    Frequency: Never  . Drug use: No  . Sexual activity: Not on file  Lifestyle  . Physical activity:    Days per week: 0 days    Minutes per session: 0 min  . Stress: Only a little  Relationships  . Social connections:    Talks on phone: More than three times a week    Gets together: More than three times a week    Attends religious service: Never    Active member of club or organization: No    Attends meetings of clubs or organizations: Never    Relationship status: Married  Other Topics Concern  . Not on file  Social History Narrative   Social History     Socioeconomic History       Marital status: Married   09/01/1954       Spouse name: Tyrone Nine   Two people  stay in the home on the fourth floor       Number of children: 2       Years of education:       Highest education level: Not on file    Exercise: yoga, walking     Social Needs       Financial resource strain: Not on file       Food insecurity - worry: Not on file       Food insecurity - inability: Not on file       Transportation needs - medical: Not on file       Transportation needs - non-medical: Not on file     Occupational History       Not on file     Tobacco Use       Smoking status: Never Smoker       Smokeless tobacco: Never Used     Substance and Sexual Activity       Alcohol use: No       Drug use: No       Sexual activity: Not on file     Other Topics       Concerns:         Not on file     Social History Narrative       Not on file    Outpatient Encounter Medications as of 10/20/2017   Medication Sig  . acetaminophen (TYLENOL) 325 MG tablet Take 650 mg by mouth every 4 (four) hours as needed for mild pain or headache.  Marland Kitchen aspirin EC 81 MG tablet Take 81 mg by mouth daily.  . Calcium Carb-Cholecalciferol (CALCIUM 500 +D) 500-400 MG-UNIT TABS Take 1 tablet by mouth daily.  . cholecalciferol (VITAMIN D) 1000 units tablet Take 2,000 Units by mouth daily.  . Multiple Vitamin (MULTIVITAMIN WITH MINERALS) TABS tablet Take 1 tablet by mouth daily.  Marland Kitchen UNABLE TO FIND every other day. Med Name: Silver Dressing. Cleanse wound to lower right leg with normal saline. Then cleanse the area with wound skin prep. Allow to dry. Cut and place a piece of Kerra Cel (Silver dressing) to the wound. Then cover with a Tefla self adhesive dressing.  . valsartan (DIOVAN) 160 MG tablet Take 160 mg by mouth daily.  . vitamin C (ASCORBIC ACID) 250 MG tablet Take 250 mg by mouth daily.   No facility-administered encounter medications on file as of 10/20/2017.     Activities of Daily Living In your present state of health, do you have any difficulty performing the following activities: 10/20/2017  Hearing? N  Vision? N  Difficulty concentrating or making decisions? N  Walking or climbing stairs? Y  Dressing or bathing? N  Doing errands, shopping? N  Preparing Food and eating ? N  Using the Toilet? N  In the past six months, have you accidently leaked urine? Y  Do you have problems with loss of bowel control? N  Managing your Medications? N  Managing your Finances? N  Housekeeping or managing your Housekeeping? N  Some recent data might be hidden    Patient Care Team: Blanchie Serve, MD as PCP - General (Internal Medicine) Mast, Man X, NP as Nurse Practitioner (Internal Medicine)    Assessment:   This is a routine wellness examination for Peggy [name].  Exercise Activities and Dietary recommendations Current Exercise Habits: The patient does not participate in regular exercise at present, Exercise  limited by: orthopedic condition(s)  Goals    None  Fall Risk Fall Risk  10/20/2017 08/10/2017  Falls in the past year? Yes No  Number falls in past yr: 1 -  Injury with Fall? Yes -   Is the patient's home free of loose throw rugs in walkways, pet beds, electrical cords, etc?   yes      Grab bars in the bathroom? yes      Handrails on the stairs?   yes      Adequate lighting?   yes  Timed Get Up and Go performed: 25 seconds, fall risk  Depression Screen PHQ 2/9 Scores 10/20/2017 08/10/2017  PHQ - 2 Score 0 0     Cognitive Function MMSE - Mini Mental State Exam 10/20/2017  Orientation to time 4  Orientation to Place 5  Registration 3  Attention/ Calculation 5  Recall 2  Language- name 2 objects 2  Language- repeat 1  Language- follow 3 step command 3  Language- read & follow direction 1  Write a sentence 1  Copy design 1  Total score 28        Immunization History  Administered Date(s) Administered  . Influenza-Unspecified 06/14/2017  . Pneumococcal-Unspecified 06/14/2017    Qualifies for Shingles Vaccine? Not in past records  Screening Tests Health Maintenance  Topic Date Due  . TETANUS/TDAP  05[birth year]04[birth year]1950  . DEXA SCAN  05/04/1996  . INFLUENZA VACCINE  01/12/2018  . PNA vac Low Risk Adult (2 of 2 - PCV13) 06/14/2018    Cancer Screenings: Lung: Low Dose CT Chest recommended if Age 59-80 years, 30 pack-year currently smoking OR have quit w/in 15years. Patient does not qualify. Breast:  Up to date on Mammogram? Yes   Up to date of Bone Density/Dexa? No, ordered Colorectal: up to date  Additional Screenings:  Hepatitis C Screening: declined TDAP due-declined    Plan:    I have personally reviewed and addressed the Medicare Annual Wellness questionnaire and have noted the following in the patient's chart:  A. Medical and social history B. Use of alcohol, tobacco or illicit drugs  C. Current medications and supplements D. Functional ability and status E.   Nutritional status F.  Physical activity G. Advance directives H. List of other physicians I.  Hospitalizations, surgeries, and ER visits in previous 12 months J.  [place] to include hearing, vision, cognitive, depression L. Referrals and appointments - none  In addition, I have reviewed and discussed with patient certain preventive protocols, quality metrics, and best practice recommendations. A written personalized care plan for preventive services as well as general preventive health recommendations were provided to patient.  See attached scanned questionnaire for additional information.   Signed,   Tyson Dense, RN Nurse Health Advisor  Patient Concerns: Patient states she has been waking up every early morning to an achy pain in her left arm ever since she moved to assisted living. It will go away once she gets up out of bed

## 2017-10-20 NOTE — Patient Instructions (Addendum)
Peggy [name] , Thank you for taking time to come for your Medicare Wellness Visit. I appreciate your ongoing commitment to your health goals. Please review the following plan we discussed and let me know if I can assist you in the future.   Screening recommendations/referrals: Colonoscopy excluded, over age 82 Mammogram excluded, over age  47 Bone Density due, ordered Recommended yearly ophthalmology/optometry visit for glaucoma screening and checkup Recommended yearly dental visit for hygiene and checkup  Vaccinations: Influenza vaccine up to date, due 2019 fall season Pneumococcal vaccine up to date, completed Tdap vaccine due, declined Shingles vaccine not in past records    Advanced directives: In chart  Conditions/risks identified: none  Next appointment: [facility] 11/10/2017 @ 2pm   Preventive Care 65 Years and Older, Female Preventive care refers to lifestyle choices and visits with your health care provider that can promote health and wellness. What does preventive care include?  A yearly physical exam. This is also called an annual well check.  Dental exams once or twice a year.  Routine eye exams. Ask your health care provider how often you should have your eyes checked.  Personal lifestyle choices, including:  Daily care of your teeth and gums.  Regular physical activity.  Eating a healthy diet.  Avoiding tobacco and drug use.  Limiting alcohol use.  Practicing safe sex.  Taking low-dose aspirin every day.  Taking vitamin and mineral supplements as recommended by your health care provider. What happens during an annual well check? The services and screenings done by your health care provider during your annual well check will depend on your age, overall health, lifestyle risk factors, and family history of disease. Counseling  Your health care provider may ask you questions about your:  Alcohol use.  Tobacco use.  Drug use.  Emotional  well-being.  Home and relationship well-being.  Sexual activity.  Eating habits.  History of falls.  Memory and ability to understand (cognition).  Work and work Statistician.  Reproductive health. Screening  You may have the following tests or measurements:  Height, weight, and BMI.  Blood pressure.  Lipid and cholesterol levels. These may be checked every 5 years, or more frequently if you are over 3 years old.  Skin check.  Lung cancer screening. You may have this screening every year starting at age 90 if you have a 30-pack-year history of smoking and currently smoke or have quit within the past 15 years.  Fecal occult blood test (FOBT) of the stool. You may have this test every year starting at age 64.  Flexible sigmoidoscopy or colonoscopy. You may have a sigmoidoscopy every 5 years or a colonoscopy every 10 years starting at age 58.  Hepatitis C blood test.  Hepatitis B blood test.  Sexually transmitted disease (STD) testing.  Diabetes screening. This is done by checking your blood sugar (glucose) after you have not eaten for a while (fasting). You may have this done every 1-3 years.  Bone density scan. This is done to screen for osteoporosis. You may have this done starting at age 67.  Mammogram. This may be done every 1-2 years. Talk to your health care provider about how often you should have regular mammograms. Talk with your health care provider about your test results, treatment options, and if necessary, the need for more tests. Vaccines  Your health care provider may recommend certain vaccines, such as:  Influenza vaccine. This is recommended every year.  Tetanus, diphtheria, and acellular pertussis (Tdap, Td) vaccine. You  may need a Td booster every 10 years.  Zoster vaccine. You may need this after age 27.  Pneumococcal 13-valent conjugate (PCV13) vaccine. One dose is recommended after age 30.  Pneumococcal polysaccharide (PPSV23) vaccine. One  dose is recommended after age 65. Talk to your health care provider about which screenings and vaccines you need and how often you need them. This information is not intended to replace advice given to you by your health care provider. Make sure you discuss any questions you have with your health care provider. Document Released: 06/27/2015 Document Revised: 02/18/2016 Document Reviewed: 04/01/2015 Elsevier Interactive Patient Education  2017 [place] Prevention in the Home Falls can cause injuries. They can happen to people of all ages. There are many things you can do to make your home safe and to help prevent falls. What can I do on the outside of my home?  Regularly fix the edges of walkways and driveways and fix any cracks.  Remove anything that might make you trip as you walk through a door, such as a raised step or threshold.  Trim any bushes or trees on the path to your home.  Use bright outdoor lighting.  Clear any walking paths of anything that might make someone trip, such as rocks or tools.  Regularly check to see if handrails are loose or broken. Make sure that both sides of any steps have handrails.  Any raised decks and porches should have guardrails on the edges.  Have any leaves, snow, or ice cleared regularly.  Use sand or salt on walking paths during winter.  Clean up any spills in your garage right away. This includes oil or grease spills. What can I do in the bathroom?  Use night lights.  Install grab bars by the toilet and in the tub and shower. Do not use towel bars as grab bars.  Use non-skid mats or decals in the tub or shower.  If you need to sit down in the shower, use a plastic, non-slip stool.  Keep the floor dry. Clean up any water that spills on the floor as soon as it happens.  Remove soap buildup in the tub or shower regularly.  Attach bath mats securely with double-sided non-slip rug tape.  Do not have throw rugs and other  things on the floor that can make you trip. What can I do in the bedroom?  Use night lights.  Make sure that you have a light by your bed that is easy to reach.  Do not use any sheets or blankets that are too big for your bed. They should not hang down onto the floor.  Have a firm chair that has side arms. You can use this for support while you get dressed.  Do not have throw rugs and other things on the floor that can make you trip. What can I do in the kitchen?  Clean up any spills right away.  Avoid walking on wet floors.  Keep items that you use a lot in easy-to-reach places.  If you need to reach something above you, use a strong step stool that has a grab bar.  Keep electrical cords out of the way.  Do not use floor polish or wax that makes floors slippery. If you must use wax, use non-skid floor wax.  Do not have throw rugs and other things on the floor that can make you trip. What can I do with my stairs?  Do not leave any items  on the stairs.  Make sure that there are handrails on both sides of the stairs and use them. Fix handrails that are broken or loose. Make sure that handrails are as long as the stairways.  Check any carpeting to make sure that it is firmly attached to the stairs. Fix any carpet that is loose or worn.  Avoid having throw rugs at the top or bottom of the stairs. If you do have throw rugs, attach them to the floor with carpet tape.  Make sure that you have a light switch at the top of the stairs and the bottom of the stairs. If you do not have them, ask someone to add them for you. What else can I do to help prevent falls?  Wear shoes that:  Do not have high heels.  Have rubber bottoms.  Are comfortable and fit you well.  Are closed at the toe. Do not wear sandals.  If you use a stepladder:  Make sure that it is fully opened. Do not climb a closed stepladder.  Make sure that both sides of the stepladder are locked into place.  Ask  someone to hold it for you, if possible.  Clearly mark and make sure that you can see:  Any grab bars or handrails.  First and last steps.  Where the edge of each step is.  Use tools that help you move around (mobility aids) if they are needed. These include:  Canes.  Walkers.  Scooters.  Crutches.  Turn on the lights when you go into a dark area. Replace any light bulbs as soon as they burn out.  Set up your furniture so you have a clear path. Avoid moving your furniture around.  If any of your floors are uneven, fix them.  If there are any pets around you, be aware of where they are.  Review your medicines with your doctor. Some medicines can make you feel dizzy. This can increase your chance of falling. Ask your doctor what other things that you can do to help prevent falls. This information is not intended to replace advice given to you by your health care provider. Make sure you discuss any questions you have with your health care provider. Document Released: 03/27/2009 Document Revised: 11/06/2015 Document Reviewed: 07/05/2014 Elsevier Interactive Patient Education  2017 [employer].  2

## 2017-10-21 DIAGNOSIS — M6281 Muscle weakness (generalized): Secondary | ICD-10-CM | POA: Diagnosis not present

## 2017-10-21 DIAGNOSIS — Z9181 History of falling: Secondary | ICD-10-CM | POA: Diagnosis not present

## 2017-10-21 DIAGNOSIS — R2681 Unsteadiness on feet: Secondary | ICD-10-CM | POA: Diagnosis not present

## 2017-10-21 DIAGNOSIS — R278 Other lack of coordination: Secondary | ICD-10-CM | POA: Diagnosis not present

## 2017-10-21 DIAGNOSIS — Z7389 Other problems related to life management difficulty: Secondary | ICD-10-CM | POA: Diagnosis not present

## 2017-10-21 DIAGNOSIS — N3946 Mixed incontinence: Secondary | ICD-10-CM | POA: Diagnosis not present

## 2017-10-24 DIAGNOSIS — N3946 Mixed incontinence: Secondary | ICD-10-CM | POA: Diagnosis not present

## 2017-10-24 DIAGNOSIS — R278 Other lack of coordination: Secondary | ICD-10-CM | POA: Diagnosis not present

## 2017-10-24 DIAGNOSIS — Z7389 Other problems related to life management difficulty: Secondary | ICD-10-CM | POA: Diagnosis not present

## 2017-10-24 DIAGNOSIS — Z9181 History of falling: Secondary | ICD-10-CM | POA: Diagnosis not present

## 2017-10-24 DIAGNOSIS — R2681 Unsteadiness on feet: Secondary | ICD-10-CM | POA: Diagnosis not present

## 2017-10-24 DIAGNOSIS — M6281 Muscle weakness (generalized): Secondary | ICD-10-CM | POA: Diagnosis not present

## 2017-10-25 DIAGNOSIS — R2681 Unsteadiness on feet: Secondary | ICD-10-CM | POA: Diagnosis not present

## 2017-10-25 DIAGNOSIS — R278 Other lack of coordination: Secondary | ICD-10-CM | POA: Diagnosis not present

## 2017-10-25 DIAGNOSIS — M6281 Muscle weakness (generalized): Secondary | ICD-10-CM | POA: Diagnosis not present

## 2017-10-25 DIAGNOSIS — Z9181 History of falling: Secondary | ICD-10-CM | POA: Diagnosis not present

## 2017-10-25 DIAGNOSIS — N3946 Mixed incontinence: Secondary | ICD-10-CM | POA: Diagnosis not present

## 2017-10-25 DIAGNOSIS — Z7389 Other problems related to life management difficulty: Secondary | ICD-10-CM | POA: Diagnosis not present

## 2017-10-26 DIAGNOSIS — Z9181 History of falling: Secondary | ICD-10-CM | POA: Diagnosis not present

## 2017-10-26 DIAGNOSIS — M6281 Muscle weakness (generalized): Secondary | ICD-10-CM | POA: Diagnosis not present

## 2017-10-26 DIAGNOSIS — Z7389 Other problems related to life management difficulty: Secondary | ICD-10-CM | POA: Diagnosis not present

## 2017-10-26 DIAGNOSIS — R278 Other lack of coordination: Secondary | ICD-10-CM | POA: Diagnosis not present

## 2017-10-26 DIAGNOSIS — N3946 Mixed incontinence: Secondary | ICD-10-CM | POA: Diagnosis not present

## 2017-10-26 DIAGNOSIS — R2681 Unsteadiness on feet: Secondary | ICD-10-CM | POA: Diagnosis not present

## 2017-10-27 DIAGNOSIS — R278 Other lack of coordination: Secondary | ICD-10-CM | POA: Diagnosis not present

## 2017-10-27 DIAGNOSIS — Z7389 Other problems related to life management difficulty: Secondary | ICD-10-CM | POA: Diagnosis not present

## 2017-10-27 DIAGNOSIS — M6281 Muscle weakness (generalized): Secondary | ICD-10-CM | POA: Diagnosis not present

## 2017-10-27 DIAGNOSIS — Z9181 History of falling: Secondary | ICD-10-CM | POA: Diagnosis not present

## 2017-10-27 DIAGNOSIS — N3946 Mixed incontinence: Secondary | ICD-10-CM | POA: Diagnosis not present

## 2017-10-27 DIAGNOSIS — R2681 Unsteadiness on feet: Secondary | ICD-10-CM | POA: Diagnosis not present

## 2017-10-31 ENCOUNTER — Encounter (HOSPITAL_BASED_OUTPATIENT_CLINIC_OR_DEPARTMENT_OTHER): Payer: Medicare Other

## 2017-10-31 DIAGNOSIS — S82034A Nondisplaced transverse fracture of right patella, initial encounter for closed fracture: Secondary | ICD-10-CM | POA: Diagnosis not present

## 2017-11-01 DIAGNOSIS — N3946 Mixed incontinence: Secondary | ICD-10-CM | POA: Diagnosis not present

## 2017-11-01 DIAGNOSIS — R2681 Unsteadiness on feet: Secondary | ICD-10-CM | POA: Diagnosis not present

## 2017-11-01 DIAGNOSIS — Z7389 Other problems related to life management difficulty: Secondary | ICD-10-CM | POA: Diagnosis not present

## 2017-11-01 DIAGNOSIS — Z9181 History of falling: Secondary | ICD-10-CM | POA: Diagnosis not present

## 2017-11-01 DIAGNOSIS — M6281 Muscle weakness (generalized): Secondary | ICD-10-CM | POA: Diagnosis not present

## 2017-11-01 DIAGNOSIS — R278 Other lack of coordination: Secondary | ICD-10-CM | POA: Diagnosis not present

## 2017-11-02 DIAGNOSIS — N3946 Mixed incontinence: Secondary | ICD-10-CM | POA: Diagnosis not present

## 2017-11-02 DIAGNOSIS — M6281 Muscle weakness (generalized): Secondary | ICD-10-CM | POA: Diagnosis not present

## 2017-11-02 DIAGNOSIS — Z9181 History of falling: Secondary | ICD-10-CM | POA: Diagnosis not present

## 2017-11-02 DIAGNOSIS — Z7389 Other problems related to life management difficulty: Secondary | ICD-10-CM | POA: Diagnosis not present

## 2017-11-02 DIAGNOSIS — R2681 Unsteadiness on feet: Secondary | ICD-10-CM | POA: Diagnosis not present

## 2017-11-02 DIAGNOSIS — R278 Other lack of coordination: Secondary | ICD-10-CM | POA: Diagnosis not present

## 2017-11-03 DIAGNOSIS — Z9181 History of falling: Secondary | ICD-10-CM | POA: Diagnosis not present

## 2017-11-03 DIAGNOSIS — R2681 Unsteadiness on feet: Secondary | ICD-10-CM | POA: Diagnosis not present

## 2017-11-03 DIAGNOSIS — R278 Other lack of coordination: Secondary | ICD-10-CM | POA: Diagnosis not present

## 2017-11-03 DIAGNOSIS — M6281 Muscle weakness (generalized): Secondary | ICD-10-CM | POA: Diagnosis not present

## 2017-11-03 DIAGNOSIS — Z7389 Other problems related to life management difficulty: Secondary | ICD-10-CM | POA: Diagnosis not present

## 2017-11-03 DIAGNOSIS — N3946 Mixed incontinence: Secondary | ICD-10-CM | POA: Diagnosis not present

## 2017-11-10 ENCOUNTER — Non-Acute Institutional Stay: Payer: Medicare Other | Admitting: Nurse Practitioner

## 2017-11-10 DIAGNOSIS — S82001S Unspecified fracture of right patella, sequela: Secondary | ICD-10-CM

## 2017-11-10 DIAGNOSIS — S82001A Unspecified fracture of right patella, initial encounter for closed fracture: Secondary | ICD-10-CM | POA: Insufficient documentation

## 2017-11-10 DIAGNOSIS — L97919 Non-pressure chronic ulcer of unspecified part of right lower leg with unspecified severity: Secondary | ICD-10-CM

## 2017-11-10 DIAGNOSIS — I83019 Varicose veins of right lower extremity with ulcer of unspecified site: Secondary | ICD-10-CM

## 2017-11-10 DIAGNOSIS — I872 Venous insufficiency (chronic) (peripheral): Secondary | ICD-10-CM

## 2017-11-10 DIAGNOSIS — D649 Anemia, unspecified: Secondary | ICD-10-CM | POA: Diagnosis not present

## 2017-11-10 DIAGNOSIS — I10 Essential (primary) hypertension: Secondary | ICD-10-CM | POA: Diagnosis not present

## 2017-11-10 DIAGNOSIS — S8254XD Nondisplaced fracture of medial malleolus of right tibia, subsequent encounter for closed fracture with routine healing: Secondary | ICD-10-CM | POA: Insufficient documentation

## 2017-11-10 NOTE — Assessment & Plan Note (Signed)
R knee fx: healing nicely, wearing knee brace once out of bed.

## 2017-11-10 NOTE — Assessment & Plan Note (Signed)
Healed

## 2017-11-10 NOTE — Assessment & Plan Note (Signed)
Last Hgb 9.2 10/17/17, update CBC prior to the next visit.

## 2017-11-10 NOTE — Assessment & Plan Note (Signed)
HTN: blood pressure is controlled, continue  Valsartan 160mg  qd.

## 2017-11-10 NOTE — Patient Instructions (Signed)
F/u in clinic 3 months, obtain CBC prior to the next appointment.

## 2017-11-10 NOTE — Assessment & Plan Note (Addendum)
healed infected ulcerated area of the medial left lower leg. Apparent edema in BLE, fluid is pocked under the skin seen,  Dorsalis pedis pulses intact. Risk, benefit, and alternative of diuretics discussed with the patient. She is not able to apply compression hosiery at home, but willing to wear knee high socks for protection. She denied cough, SOB, decreased appetite or interrupted night sleep.

## 2017-11-10 NOTE — Progress Notes (Signed)
Location:   clinic [place]   Place of Service:  Clinic (12) Provider: Marlana Latus NP  Code Status: DNR Goals of Care: [place] 10/20/2017  Does Patient Have a Medical Advance Directive? Yes  Type of Paramedic of [place];Living will  Does patient want to make changes to medical advance directive? No - Patient declined  Copy of [place] in Chart? Yes     Chief Complaint  Patient presents with  . Medical Management of Chronic Issues    3 mo f/u-on leg     HPI: Patient is a 84 y.o. female seen today for an acute visit for venous insufficiency, healed infected ulcerated area of the medial left lower leg. Apparent edema in BLE, fluid is pocked under the skin seen,  Dorsalis pedis pulses intact. Risk, benefit, and alternative of diuretics discussed with the patient. She is not able to apply compression hosiery at home, but willing to wear knee high socks for protection. She denied cough, SOB, decreased appetite or interrupted night sleep. HTN: blood pressure is controlled on Valsartan 160mg  qd. R knee fx: healing nicely, wearing knee brace once out of bed.   Past Medical History:  Diagnosis Date  . Hypertension     History reviewed. No pertinent surgical history.  Allergies  Allergen Reactions  . Ace Inhibitors   . Flagyl [Metronidazole]   . Penicillins     Allergies as of 11/10/2017      Reactions   Ace Inhibitors    Flagyl [metronidazole]    Penicillins       Medication List        Accurate as of 11/10/17 11:59 PM. Always use your most recent med list.          acetaminophen 325 MG tablet Commonly known as:  TYLENOL Take 650 mg by mouth every 4 (four) hours as needed for mild pain or headache.   aspirin EC 81 MG tablet Take 81 mg by mouth daily.   CALCIUM 500 +D 500-400 MG-UNIT Tabs Generic drug:  Calcium Carb-Cholecalciferol Take 1 tablet by mouth daily.   cholecalciferol 1000 units tablet Commonly  known as:  VITAMIN D Take 2,000 Units by mouth daily.   multivitamin with minerals Tabs tablet Take 1 tablet by mouth daily.   UNABLE TO FIND every other day. Med Name: Silver Dressing. Cleanse wound to lower right leg with normal saline. Then cleanse the area with wound skin prep. Allow to dry. Cut and place a piece of Kerra Cel (Silver dressing) to the wound. Then cover with a Tefla self adhesive dressing.   valsartan 160 MG tablet Commonly known as:  DIOVAN Take 160 mg by mouth daily.   vitamin C 250 MG tablet Commonly known as:  ASCORBIC ACID Take 250 mg by mouth daily.       Review of Systems:  Review of Systems  Constitutional: Negative for activity change, appetite change, chills, diaphoresis, fatigue and fever.  HENT: Positive for hearing loss. Negative for congestion, trouble swallowing and voice change.   Eyes: Positive for visual disturbance.  Respiratory: Negative for cough and shortness of breath.   Cardiovascular: Positive for leg swelling. Negative for chest pain and palpitations.  Gastrointestinal: Negative for abdominal distention, abdominal pain, constipation, diarrhea, nausea and vomiting.  Genitourinary: Negative for difficulty urinating, dysuria and urgency.       Urinary leakage sometimes  Musculoskeletal: Positive for gait problem.       Ambulates walker.  Skin: Negative for color change and pallor.       Pocked fluid in legs  Neurological: Negative for dizziness, speech difficulty, weakness and headaches.       Memory lapses.   Psychiatric/Behavioral: Negative for agitation, behavioral problems, hallucinations and sleep disturbance. The patient is not nervous/anxious.     Health Maintenance  Topic Date Due  . TETANUS/TDAP  [birth year]/04/1950  . DEXA SCAN  05/04/1996  . INFLUENZA VACCINE  01/12/2018  . PNA vac Low Risk Adult  Completed    Physical Exam: Vitals:   11/14/17 0907  BP: 120/78  Pulse: 94  Resp: 18  Temp: 98 F (36.7 C)  SpO2: 96%    Weight: 120 lb (54.4 kg)   Body mass index is 21.26 kg/m. Physical Exam  Constitutional: She is oriented to person, place, and time. She appears well-developed and well-nourished.  HENT:  Head: Normocephalic and atraumatic.  Eyes: Pupils are equal, round, and reactive to light. EOM are normal.  Neck: Normal range of motion. Neck supple. No JVD present. No thyromegaly present.  Cardiovascular: Normal rate and regular rhythm.  No murmur heard. Pulmonary/Chest: Effort normal and breath sounds normal. She has no wheezes. She has no rales.  Abdominal: Soft. Bowel sounds are normal. She exhibits no distension. There is no tenderness.  Musculoskeletal: Normal range of motion. She exhibits edema.  Edema 1+ BLE, R>L, fluid pocketing BLE, ambulates with walker.   Neurological: She is alert and oriented to person, place, and time. No cranial nerve deficit. She exhibits normal muscle tone. Coordination normal.  Skin: Skin is warm and dry.  Pigmented BLE, fluid pockets seen BLE  Psychiatric: She has a normal mood and affect. Her behavior is normal. Judgment and thought content normal.    Labs reviewed: Basic Metabolic Panel: Recent Labs    08/16/17 0705 09/08/17 0730 10/13/17  NA 138 137 138  K 4.5 4.1 4.2  CL 104 104  --   CO2 30 29  --   GLUCOSE 88 72  --   BUN 29* 26* 27*  CREATININE 1.05* 1.03* 1.0  CALCIUM 9.7 9.9  --    Liver Function Tests: Recent Labs    08/16/17 0705 09/08/17 0730 10/13/17  AST 19 19 18   ALT 11 11 14   ALKPHOS  --   --  60  BILITOT 0.5 0.5  --   PROT 6.8 6.6  --    No results for input(s): LIPASE, AMYLASE in the last 8760 hours. No results for input(s): AMMONIA in the last 8760 hours. CBC: Recent Labs    08/16/17 0705 09/08/17 0730 10/13/17 10/17/17  WBC 6.4 6.2 5.2 6.0  NEUTROABS 3,507 3,708  --   --   HGB 11.4* 11.0* 9.2* 9.2*  HCT 33.1* 32.7* 28* 27*  MCV 94.8 96.7  --   --   PLT 305 249 285 280   Lipid Panel: Recent Labs     08/16/17 0705  CHOL 159  HDL 73  LDLCALC 73  TRIG 53  CHOLHDL 2.2   No results found for: HGBA1C  Procedures since last visit: No results found.  Assessment/Plan Venous insufficiency (chronic) (peripheral) healed infected ulcerated area of the medial left lower leg. Apparent edema in BLE, fluid is pocked under the skin seen,  Dorsalis pedis pulses intact. Risk, benefit, and alternative of diuretics discussed with the patient. She is not able to apply compression hosiery at home, but willing to wear knee high socks for protection. She denied cough, SOB,  decreased appetite or interrupted night sleep.   Hypertension HTN: blood pressure is controlled, continue  Valsartan 160mg  qd.   Stasis ulcer of right lower extremity (HCC) Healed.   Right patella fracture R knee fx: healing nicely, wearing knee brace once out of bed.    Anemia Last Hgb 9.2 10/17/17, update CBC prior to the next visit.     Labs/tests ordered: CBC   Plan of care reviewed with the patient. Tdap prescription provided today.   Next appt:  3 months  Time spend 25 minutes.

## 2017-11-11 DIAGNOSIS — N3946 Mixed incontinence: Secondary | ICD-10-CM | POA: Diagnosis not present

## 2017-11-11 DIAGNOSIS — N8500 Endometrial hyperplasia, unspecified: Secondary | ICD-10-CM | POA: Diagnosis not present

## 2017-11-11 DIAGNOSIS — M6281 Muscle weakness (generalized): Secondary | ICD-10-CM | POA: Diagnosis not present

## 2017-11-11 DIAGNOSIS — M8580 Other specified disorders of bone density and structure, unspecified site: Secondary | ICD-10-CM | POA: Diagnosis not present

## 2017-11-11 DIAGNOSIS — S82001D Unspecified fracture of right patella, subsequent encounter for closed fracture with routine healing: Secondary | ICD-10-CM | POA: Diagnosis not present

## 2017-11-11 DIAGNOSIS — H02409 Unspecified ptosis of unspecified eyelid: Secondary | ICD-10-CM | POA: Diagnosis not present

## 2017-11-11 DIAGNOSIS — Z9181 History of falling: Secondary | ICD-10-CM | POA: Diagnosis not present

## 2017-11-11 DIAGNOSIS — Z7389 Other problems related to life management difficulty: Secondary | ICD-10-CM | POA: Diagnosis not present

## 2017-11-11 DIAGNOSIS — H25013 Cortical age-related cataract, bilateral: Secondary | ICD-10-CM | POA: Diagnosis not present

## 2017-11-11 DIAGNOSIS — R278 Other lack of coordination: Secondary | ICD-10-CM | POA: Diagnosis not present

## 2017-11-14 ENCOUNTER — Encounter: Payer: Self-pay | Admitting: Nurse Practitioner

## 2017-11-14 DIAGNOSIS — S82034A Nondisplaced transverse fracture of right patella, initial encounter for closed fracture: Secondary | ICD-10-CM | POA: Diagnosis not present

## 2017-11-15 DIAGNOSIS — C3412 Malignant neoplasm of upper lobe, left bronchus or lung: Secondary | ICD-10-CM | POA: Diagnosis not present

## 2017-11-15 DIAGNOSIS — H25013 Cortical age-related cataract, bilateral: Secondary | ICD-10-CM | POA: Diagnosis not present

## 2017-11-15 DIAGNOSIS — M8580 Other specified disorders of bone density and structure, unspecified site: Secondary | ICD-10-CM | POA: Diagnosis not present

## 2017-11-15 DIAGNOSIS — I214 Non-ST elevation (NSTEMI) myocardial infarction: Secondary | ICD-10-CM | POA: Diagnosis not present

## 2017-11-15 DIAGNOSIS — I10 Essential (primary) hypertension: Secondary | ICD-10-CM | POA: Diagnosis not present

## 2017-11-15 DIAGNOSIS — N8500 Endometrial hyperplasia, unspecified: Secondary | ICD-10-CM | POA: Diagnosis not present

## 2017-11-15 DIAGNOSIS — M79643 Pain in unspecified hand: Secondary | ICD-10-CM | POA: Diagnosis not present

## 2017-11-15 DIAGNOSIS — H02409 Unspecified ptosis of unspecified eyelid: Secondary | ICD-10-CM | POA: Diagnosis not present

## 2017-11-15 DIAGNOSIS — M25559 Pain in unspecified hip: Secondary | ICD-10-CM | POA: Diagnosis not present

## 2017-11-15 DIAGNOSIS — R278 Other lack of coordination: Secondary | ICD-10-CM | POA: Diagnosis not present

## 2017-11-16 DIAGNOSIS — M79643 Pain in unspecified hand: Secondary | ICD-10-CM | POA: Diagnosis not present

## 2017-11-16 DIAGNOSIS — I10 Essential (primary) hypertension: Secondary | ICD-10-CM | POA: Diagnosis not present

## 2017-11-16 DIAGNOSIS — M25559 Pain in unspecified hip: Secondary | ICD-10-CM | POA: Diagnosis not present

## 2017-11-16 DIAGNOSIS — C3412 Malignant neoplasm of upper lobe, left bronchus or lung: Secondary | ICD-10-CM | POA: Diagnosis not present

## 2017-11-16 DIAGNOSIS — R278 Other lack of coordination: Secondary | ICD-10-CM | POA: Diagnosis not present

## 2017-11-16 DIAGNOSIS — I214 Non-ST elevation (NSTEMI) myocardial infarction: Secondary | ICD-10-CM | POA: Diagnosis not present

## 2017-11-17 ENCOUNTER — Telehealth: Payer: Self-pay

## 2017-11-17 NOTE — Telephone Encounter (Signed)
Spoke with the patient and she has agreed to be seen in the clinic Tuesday 11/22/17 at 9:30 am.

## 2017-11-17 NOTE — Telephone Encounter (Signed)
[place], have pt be seen in clinic to assess and discuss care plan.

## 2017-11-17 NOTE — Telephone Encounter (Signed)
She is concerned with both legs having the lumps. She has been wearing knee socks but unclear which to buy specifically. She is wondering if they are to be compression socks.  She also wants to know should she be wearing gauze in her socks. Routed to Dr.Pandey.

## 2017-11-18 DIAGNOSIS — M79643 Pain in unspecified hand: Secondary | ICD-10-CM | POA: Diagnosis not present

## 2017-11-18 DIAGNOSIS — I10 Essential (primary) hypertension: Secondary | ICD-10-CM | POA: Diagnosis not present

## 2017-11-18 DIAGNOSIS — C3412 Malignant neoplasm of upper lobe, left bronchus or lung: Secondary | ICD-10-CM | POA: Diagnosis not present

## 2017-11-18 DIAGNOSIS — M25559 Pain in unspecified hip: Secondary | ICD-10-CM | POA: Diagnosis not present

## 2017-11-18 DIAGNOSIS — R278 Other lack of coordination: Secondary | ICD-10-CM | POA: Diagnosis not present

## 2017-11-18 DIAGNOSIS — I214 Non-ST elevation (NSTEMI) myocardial infarction: Secondary | ICD-10-CM | POA: Diagnosis not present

## 2017-11-22 ENCOUNTER — Telehealth: Payer: Self-pay | Admitting: Internal Medicine

## 2017-11-22 ENCOUNTER — Encounter: Payer: Self-pay | Admitting: Internal Medicine

## 2017-11-22 ENCOUNTER — Ambulatory Visit (HOSPITAL_COMMUNITY)
Admission: RE | Admit: 2017-11-22 | Discharge: 2017-11-22 | Disposition: A | Discharge status: Home / Self Care | Payer: Medicare Other | Source: Ambulatory Visit | Attending: Internal Medicine | Admitting: Internal Medicine

## 2017-11-22 ENCOUNTER — Non-Acute Institutional Stay: Payer: Medicare Other | Admitting: Internal Medicine

## 2017-11-22 VITALS — BP 128/64 | HR 83 | Temp 98.0°F | Resp 16 | Ht 63.0 in | Wt 115.4 lb

## 2017-11-22 DIAGNOSIS — R600 Localized edema: Secondary | ICD-10-CM

## 2017-11-22 MED ORDER — FUROSEMIDE 20 MG PO TABS: 20.0000 mg | ORAL_TABLET | Freq: Every day | ORAL | 3 refills | Status: DC

## 2017-11-22 MED ORDER — POTASSIUM CHLORIDE ER 10 MEQ PO TBCR: 10.0000 meq | EXTENDED_RELEASE_TABLET | Freq: Every day | ORAL | 3 refills | Status: DC

## 2017-11-22 NOTE — Progress Notes (Signed)
*  Preliminary Results* Right lower extremity venous duplex completed. Right lower extremity is negative for deep vein thrombosis. There is no evidence of right Baker's cyst.  11/22/2017 2:10 PM  Maudry Mayhew, BS, RVT, RDCS, RDMS

## 2017-11-22 NOTE — Telephone Encounter (Signed)
Appointment for Peggy [name] LOWER EXTREMITY VENOUS (DVT) has been scheduled for today @ 2:00 pm.  [name] did not availability.   Scheduled patient at [facility].  Spoke with patient, gave her appointment date, time and location. cdavis

## 2017-11-22 NOTE — Patient Instructions (Addendum)
  You need to go to [facility] for ultrasound of your right leg to rule out blood clot. You will get a call from our office with the appointment.  I need you to take lasix/ furosemide 20 mg daily with potassium chloride 10 meq daily to help reduce swelling of your leg and prevent skin breakdown from it.   Do not wear compression stocking for now. We need to rule out blood clot first. We will call you with the result and further instruction.   Remember to get lab work done on 12/06/17 and I will see you in 2 weeks for follow up.

## 2017-11-22 NOTE — Progress Notes (Signed)
Location:  [facility]   Place of Service:  Clinic (12) Provider:    MD  , , MD  Patient Care Team: , , MD as PCP - General (Internal Medicine) Mast, Man X, NP as Nurse Practitioner (Internal Medicine)  Extended Emergency Contact Information Primary Emergency Contact: Galdamez,Floyd Address: [facility]          [address]          [address]          [place], [place] [address] [place] Home Phone: [phone] Relation: Spouse Secondary Emergency Contact: Haigh,[name] Address: [address]          , [place] [address] [place] Home Phone: [phone] Work Phone: [phone] Mobile Phone: [phone] Relation: Daughter  Code Status:  DNR  Goals of care: Advanced Directive information Advanced Directives 11/22/2017  Does Patient Have a Medical Advance Directive? Yes  Type of Advance Directive Healthcare Power of Attorney;Out of facility DNR (pink MOST or yellow form)  Does patient want to make changes to medical advance directive? No - Patient declined  Copy of Healthcare Power of Attorney in Chart? Yes  Pre-existing out of facility DNR order (yellow form or pink MOST form) Yellow form placed in chart (order not valid for inpatient use);Pink MOST form placed in chart (order not valid for inpatient use)     Chief Complaint  Patient presents with  . Acute Visit    leg edema and bumps  . Medication Refill    No refills needed this time    HPI:  Pt is a 84 y.o. female seen today for acute visit for right leg swelling with raised areas on her calf. She denies any pain to the area but has discomfort from the swelling and pain to knee area on bearing weight. She had a fracture of right patella a month and half back and had a brace to her knee for support. The brace has been removed. Denies any further fall or trauma.    Past Medical History:  Diagnosis Date  . Hypertension     History reviewed. No pertinent surgical history.  Allergies  Allergen Reactions  . Ace Inhibitors   . Flagyl [Metronidazole]   . Penicillins     Outpatient Encounter Medications as of 11/22/2017  Medication Sig  . acetaminophen (TYLENOL) 325 MG tablet Take 650 mg by mouth every 4 (four) hours as needed for mild pain or headache.  . aspirin EC 81 MG tablet Take 81 mg by mouth daily.  . Calcium Carb-Cholecalciferol (CALCIUM 500 +D) 500-400 MG-UNIT TABS Take 1 tablet by mouth daily.  . cholecalciferol (VITAMIN D) 1000 units tablet Take 2,000 Units by mouth daily.  . Multiple Vitamin (MULTIVITAMIN WITH MINERALS) TABS tablet Take 1 tablet by mouth daily.  . valsartan (DIOVAN) 160 MG tablet Take 160 mg by mouth daily.  . vitamin C (ASCORBIC ACID) 250 MG tablet Take 250 mg by mouth daily.  . furosemide (LASIX) 20 MG tablet Take 1 tablet (20 mg total) by mouth daily.  . potassium chloride (K-DUR) 10 MEQ tablet Take 1 tablet (10 mEq total) by mouth daily.  . UNABLE TO FIND every other day. Med Name: Silver Dressing. Cleanse wound to lower right leg with normal saline. Then cleanse the area with wound skin prep. Allow to dry. Cut and place a piece of Kerra Cel (Silver dressing) to the wound. Then cover with a Tefla self adhesive dressing.   No facility-administered   encounter medications on file as of 11/22/2017.     Review of Systems  Constitutional: Negative for chills and fever.  Respiratory: Negative for cough and shortness of breath.   Cardiovascular: Positive for leg swelling. Negative for chest pain and palpitations.  Musculoskeletal: Positive for arthralgias and gait problem.  Neurological: Negative for dizziness, seizures and headaches.  Psychiatric/Behavioral: Positive for confusion. Negative for behavioral problems. The patient is not nervous/anxious.     Immunization History  Administered Date(s) Administered  . H1N1 03/18/2009  . Influenza, High Dose Seasonal PF 03/25/2017   . Influenza-Unspecified 02/25/2011, 03/31/2012, 03/27/2013, 03/14/2014, 03/25/2016, 06/14/2017  . Pneumococcal Conjugate-13 09/10/2014  . Pneumococcal Polysaccharide-23 11/03/2014  . Pneumococcal-Unspecified 06/14/2017   Pertinent  Health Maintenance Due  Topic Date Due  . DEXA SCAN  05/04/1996  . INFLUENZA VACCINE  01/12/2018  . PNA vac Low Risk Adult  Completed   Fall Risk  10/20/2017 08/10/2017  Falls in the past year? Yes No  Number falls in past yr: 1 -  Injury with Fall? Yes -   Functional Status Survey:    Vitals:   11/22/17 0933  BP: 128/64  Pulse: 83  Resp: 16  Temp: 98 F (36.7 C)  TempSrc: Oral  SpO2: 97%  Weight: 115 lb 6.4 oz (52.3 kg)  Height: 5' 3" (1.6 m)   Body mass index is 20.44 kg/m.   Wt Readings from Last 3 Encounters:  11/22/17 115 lb 6.4 oz (52.3 kg)  11/14/17 120 lb (54.4 kg)  10/20/17 121 lb (54.9 kg)   Physical Exam  Constitutional: She is oriented to person, place, and time. No distress.  Frail, elderly female  HENT:  Head: Normocephalic and atraumatic.  Eyes: Conjunctivae are normal.  Neck: Neck supple.  Cardiovascular: Normal rate and regular rhythm.  Palpable pulse to left dorsalis pedis. Right feet pulse not palpable with edema  Pulmonary/Chest: Effort normal and breath sounds normal.  Abdominal: Soft. Bowel sounds are normal.  Musculoskeletal: She exhibits edema, tenderness and deformity.  Able to move all 4 extremities, flexion limited at right knee. 2+ right leg, ankle and foot edema with fluid filled area to the calm, pitting edema. Left leg with bruises and healed wound, no leg edema. Unsteady gait and uses walker for ambulation. Swelling around right knee +  Lymphadenopathy:    She has no cervical adenopathy.  Neurological: She is alert and oriented to person, place, and time. She exhibits normal muscle tone.  Skin: Skin is warm and dry. She is not diaphoretic.  Psychiatric: She has a normal mood and affect. Her behavior is  normal.    Labs reviewed: Recent Labs    08/16/17 0705 09/08/17 0730 10/13/17  NA 138 137 138  K 4.5 4.1 4.2  CL 104 104  --   CO2 30 29  --   GLUCOSE 88 72  --   BUN 29* 26* 27*  CREATININE 1.05* 1.03* 1.0  CALCIUM 9.7 9.9  --    Recent Labs    08/16/17 0705 09/08/17 0730 10/13/17  AST _0 ALT _1 ALKPHOS  --   --  60  BILITOT 0.5 0.5  --   PROT 6.8 6.6  --    Recent Labs    08/16/17 0705 09/08/17 0730 10/13/17 10/17/17  WBC 6.4 6.2 5.2 6.0  NEUTROABS 3,507 3,708  --   --   HGB 11.4* 11.0* 9.2* 9.2*  HCT 33.1* 32.7* 28* 27*  MCV 94.8 96.7  --   --  PLT 305 249 285 280   Lab Results  Component Value Date   TSH 1.59 10/05/2016   No results found for: HGBA1C Lab Results  Component Value Date   CHOL 159 08/16/2017   HDL 73 08/16/2017   LDLCALC 73 08/16/2017   TRIG 53 08/16/2017   CHOLHDL 2.2 08/16/2017    Significant Diagnostic Results in last 30 days:  No results found.  Assessment/Plan  1. Leg edema, right Right leg 2+ edema with pain to calf area, some clear fluid filled blisters. History of recent fracture and brace placement. Not on anticoagulation and had limited mobility. Given this asymmetric significant edema rule out DVT. Other differential is of venous insufficiency. Start lose dose furosemide with kcl. Lab check prior to next visit.  - VAS [place] LOWER EXTREMITY VENOUS (DVT); Future - furosemide (LASIX) 20 MG tablet; Take 1 tablet (20 mg total) by mouth daily.  Dispense: 30 tablet; Refill: 3 - potassium chloride (K-DUR) 10 MEQ tablet; Take 1 tablet (10 mEq total) by mouth daily.  Dispense: 30 tablet; Refill: 3 - CMP with eGFR([facility]); Future - CBC (no diff); Future   Family/ staff Communication: reviewed care plan with patient    Labs/tests ordered:   Lab Orders     CMP with eGFR([facility])     CBC (no diff)    Blanchie Serve, MD Internal Medicine [place],  [place] [address] Cell Phone (Monday-Friday 8 am - 5 pm): [phone] On Call: [phone] and follow prompts after 5 pm and on weekends Office Phone: [phone] Office Fax: [phone]

## 2017-11-23 DIAGNOSIS — M79643 Pain in unspecified hand: Secondary | ICD-10-CM | POA: Diagnosis not present

## 2017-11-23 DIAGNOSIS — I214 Non-ST elevation (NSTEMI) myocardial infarction: Secondary | ICD-10-CM | POA: Diagnosis not present

## 2017-11-23 DIAGNOSIS — I10 Essential (primary) hypertension: Secondary | ICD-10-CM | POA: Diagnosis not present

## 2017-11-23 DIAGNOSIS — M25559 Pain in unspecified hip: Secondary | ICD-10-CM | POA: Diagnosis not present

## 2017-11-23 DIAGNOSIS — R278 Other lack of coordination: Secondary | ICD-10-CM | POA: Diagnosis not present

## 2017-11-23 DIAGNOSIS — C3412 Malignant neoplasm of upper lobe, left bronchus or lung: Secondary | ICD-10-CM | POA: Diagnosis not present

## 2017-11-25 DIAGNOSIS — I10 Essential (primary) hypertension: Secondary | ICD-10-CM | POA: Diagnosis not present

## 2017-11-25 DIAGNOSIS — I214 Non-ST elevation (NSTEMI) myocardial infarction: Secondary | ICD-10-CM | POA: Diagnosis not present

## 2017-11-25 DIAGNOSIS — R278 Other lack of coordination: Secondary | ICD-10-CM | POA: Diagnosis not present

## 2017-11-25 DIAGNOSIS — C3412 Malignant neoplasm of upper lobe, left bronchus or lung: Secondary | ICD-10-CM | POA: Diagnosis not present

## 2017-11-25 DIAGNOSIS — M25559 Pain in unspecified hip: Secondary | ICD-10-CM | POA: Diagnosis not present

## 2017-11-25 DIAGNOSIS — M79643 Pain in unspecified hand: Secondary | ICD-10-CM | POA: Diagnosis not present

## 2017-11-28 DIAGNOSIS — M25559 Pain in unspecified hip: Secondary | ICD-10-CM | POA: Diagnosis not present

## 2017-11-28 DIAGNOSIS — C3412 Malignant neoplasm of upper lobe, left bronchus or lung: Secondary | ICD-10-CM | POA: Diagnosis not present

## 2017-11-28 DIAGNOSIS — I214 Non-ST elevation (NSTEMI) myocardial infarction: Secondary | ICD-10-CM | POA: Diagnosis not present

## 2017-11-28 DIAGNOSIS — R278 Other lack of coordination: Secondary | ICD-10-CM | POA: Diagnosis not present

## 2017-11-28 DIAGNOSIS — I10 Essential (primary) hypertension: Secondary | ICD-10-CM | POA: Diagnosis not present

## 2017-11-28 DIAGNOSIS — M79643 Pain in unspecified hand: Secondary | ICD-10-CM | POA: Diagnosis not present

## 2017-11-29 DIAGNOSIS — I214 Non-ST elevation (NSTEMI) myocardial infarction: Secondary | ICD-10-CM | POA: Diagnosis not present

## 2017-11-29 DIAGNOSIS — C3412 Malignant neoplasm of upper lobe, left bronchus or lung: Secondary | ICD-10-CM | POA: Diagnosis not present

## 2017-11-29 DIAGNOSIS — I10 Essential (primary) hypertension: Secondary | ICD-10-CM | POA: Diagnosis not present

## 2017-11-29 DIAGNOSIS — R278 Other lack of coordination: Secondary | ICD-10-CM | POA: Diagnosis not present

## 2017-11-29 DIAGNOSIS — M25559 Pain in unspecified hip: Secondary | ICD-10-CM | POA: Diagnosis not present

## 2017-11-29 DIAGNOSIS — M79643 Pain in unspecified hand: Secondary | ICD-10-CM | POA: Diagnosis not present

## 2017-11-30 DIAGNOSIS — I214 Non-ST elevation (NSTEMI) myocardial infarction: Secondary | ICD-10-CM | POA: Diagnosis not present

## 2017-11-30 DIAGNOSIS — M79643 Pain in unspecified hand: Secondary | ICD-10-CM | POA: Diagnosis not present

## 2017-11-30 DIAGNOSIS — R278 Other lack of coordination: Secondary | ICD-10-CM | POA: Diagnosis not present

## 2017-11-30 DIAGNOSIS — I10 Essential (primary) hypertension: Secondary | ICD-10-CM | POA: Diagnosis not present

## 2017-11-30 DIAGNOSIS — C3412 Malignant neoplasm of upper lobe, left bronchus or lung: Secondary | ICD-10-CM | POA: Diagnosis not present

## 2017-11-30 DIAGNOSIS — M25559 Pain in unspecified hip: Secondary | ICD-10-CM | POA: Diagnosis not present

## 2017-12-02 DIAGNOSIS — M25559 Pain in unspecified hip: Secondary | ICD-10-CM | POA: Diagnosis not present

## 2017-12-02 DIAGNOSIS — I10 Essential (primary) hypertension: Secondary | ICD-10-CM | POA: Diagnosis not present

## 2017-12-02 DIAGNOSIS — M79643 Pain in unspecified hand: Secondary | ICD-10-CM | POA: Diagnosis not present

## 2017-12-02 DIAGNOSIS — C3412 Malignant neoplasm of upper lobe, left bronchus or lung: Secondary | ICD-10-CM | POA: Diagnosis not present

## 2017-12-02 DIAGNOSIS — I214 Non-ST elevation (NSTEMI) myocardial infarction: Secondary | ICD-10-CM | POA: Diagnosis not present

## 2017-12-02 DIAGNOSIS — R278 Other lack of coordination: Secondary | ICD-10-CM | POA: Diagnosis not present

## 2017-12-05 ENCOUNTER — Telehealth: Payer: Self-pay | Admitting: *Deleted

## 2017-12-05 DIAGNOSIS — R278 Other lack of coordination: Secondary | ICD-10-CM | POA: Diagnosis not present

## 2017-12-05 DIAGNOSIS — M25559 Pain in unspecified hip: Secondary | ICD-10-CM | POA: Diagnosis not present

## 2017-12-05 DIAGNOSIS — C3412 Malignant neoplasm of upper lobe, left bronchus or lung: Secondary | ICD-10-CM | POA: Diagnosis not present

## 2017-12-05 DIAGNOSIS — I10 Essential (primary) hypertension: Secondary | ICD-10-CM | POA: Diagnosis not present

## 2017-12-05 DIAGNOSIS — I214 Non-ST elevation (NSTEMI) myocardial infarction: Secondary | ICD-10-CM | POA: Diagnosis not present

## 2017-12-05 DIAGNOSIS — M79643 Pain in unspecified hand: Secondary | ICD-10-CM | POA: Diagnosis not present

## 2017-12-05 NOTE — Telephone Encounter (Signed)
Patient called and stated that she has 3 blisters on the back of her leg. Stated that Dr. Bubba Camp was concerned. Patient wanted to let her know that one of the blisters busted over the weekend. The nurse dressed it. Stated that she couldn't wear her compression stockings. Patient is needing direction on how to care for this. Please call patient and advise.

## 2017-12-05 NOTE — Telephone Encounter (Signed)
Spoke with the patient and she agreed to come to the clinic tomorrow 12/06/17 at 8:30 am for Dr. Bubba Camp to evaluate her leg.

## 2017-12-06 ENCOUNTER — Non-Acute Institutional Stay: Payer: Medicare Other | Admitting: Internal Medicine

## 2017-12-06 ENCOUNTER — Telehealth: Payer: Self-pay | Admitting: *Deleted

## 2017-12-06 ENCOUNTER — Encounter: Payer: Self-pay | Admitting: Internal Medicine

## 2017-12-06 VITALS — BP 126/70 | HR 77 | Temp 99.4°F | Resp 18 | Ht 63.0 in | Wt 113.2 lb

## 2017-12-06 DIAGNOSIS — I739 Peripheral vascular disease, unspecified: Secondary | ICD-10-CM

## 2017-12-06 DIAGNOSIS — I872 Venous insufficiency (chronic) (peripheral): Secondary | ICD-10-CM | POA: Diagnosis not present

## 2017-12-06 MED ORDER — FUROSEMIDE 20 MG PO TABS: 20.0000 mg | ORAL_TABLET | Freq: Two times a day (BID) | ORAL | 3 refills | Status: DC

## 2017-12-06 MED ORDER — PROFORE MISC: 1.0000 "application " | 0 refills | Status: DC

## 2017-12-06 MED ORDER — POTASSIUM CHLORIDE ER 10 MEQ PO TBCR: 10.0000 meq | EXTENDED_RELEASE_TABLET | Freq: Two times a day (BID) | ORAL | 3 refills | Status: DC

## 2017-12-06 NOTE — Telephone Encounter (Signed)
Peggy [name], Granddaughter called and left message on Clinical intake stating that she would like to speak with Dr. Bubba Camp or her Assistant regarding today's appointment and the dressings. Please call [phone]

## 2017-12-06 NOTE — Patient Instructions (Signed)
You need compression therapy to help subside the swelling and drainage from your leg. There is no signs of infection. I advise you to keep your legs elevated when resting. You will need home health agency nurse to come and do your dressing 1-2 times a week depending on the amount of drainage present. I have provided script with home health service order.   Your fluid pill furosemide has been increased from 20 mg daily to twice a day and potassium supplement has been increased to twice a day as well.  You will need blood work in 3 weeks.

## 2017-12-06 NOTE — Progress Notes (Signed)
[facility]  Provider: Blanchie Serve MD   Location:  [place] of Service:  Clinic (12)  PCP: Blanchie Serve, MD Patient Care Team: Blanchie Serve, MD as PCP - General (Internal Medicine) Mast, Man X, NP as Nurse Practitioner (Internal Medicine)  Extended Emergency Contact Information Primary Emergency Contact: Schnapp,Floyd Address: [address]          [address]          [place], [place] [address] [place] Phone: [phone] Relation: Spouse Secondary Emergency Contact: Ruben,[name] Address: [place], [place] [address] [name] Phone: [phone] Work Phone: [phone] Mobile Phone: [phone] Relation: Daughter  Goals of Care: Advanced Directive information Advanced Directives 12/06/2017  Does Patient Have a Medical Advance Directive? Yes  Type of Paramedic of [place];Out of facility DNR (pink MOST or yellow form)  Does patient want to make changes to medical advance directive? No - Patient declined  Copy of [place] in Chart? Yes  Pre-existing out of facility DNR order (yellow form or pink MOST form) Yellow form placed in chart (order not valid for inpatient use);Pink MOST form placed in chart (order not valid for inpatient use)      Chief Complaint  Patient presents with  . Acute Visit    Patient stated that a blister on the back of her right leg burst yesterday and now its weeping. She currently has a dressing over the area.   . Medication Refill    No refills needed at this time    HPI: Patient is a 84 y.o. female seen today for acute visit. She had a blister to right leg that burst yesterday. She has noted weeping from right leg. She has history of PVD. She had open wound to LLE before that has healed. She complaints of discomfort to both legs but denies any pain. Denies  dyspnea or chest pain.   Past Medical History:  Diagnosis Date  . Hypertension    History reviewed. No pertinent surgical history.  reports that she has quit smoking. She quit after 12.00 years of use. She has never used smokeless tobacco. She reports that she drank alcohol. She reports that she does not use drugs. Social History   Socioeconomic History  . Marital status: Married    Spouse name: Tyrone Nine  . Number of children: 2  . Years of education: Not on file  . Highest education level: Not on file  Occupational History  . Occupation: Pharmacist, hospital    Comment: retired  Scientific laboratory technician  . Financial resource strain: Not hard at all  . Food insecurity:    Worry: Never true    Inability: Never true  . Transportation needs:    Medical: No    Non-medical: No  Tobacco Use  . Smoking status: Former Smoker    Years: 12.00  . Smokeless tobacco: Never Used  Substance and Sexual Activity  . Alcohol use: Not Currently    Frequency: Never  . Drug use: No  . Sexual activity: Not on file  Lifestyle  . Physical activity:    Days per week: 0 days    Minutes per session: 0 min  . Stress: Only a little  Relationships  . Social connections:    Talks on phone: More than three  times a week    Gets together: More than three times a week    Attends religious service: Never    Active member of club or organization: No    Attends meetings of clubs or organizations: Never    Relationship status: Married  . Intimate partner violence:    Fear of current or ex partner: No    Emotionally abused: No    Physically abused: No    Forced sexual activity: No  Other Topics Concern  . Not on file  Social History Narrative   Social History     Socioeconomic History       Marital status: Married   09/01/1954       Spouse name: Tyrone Nine   Two people stay in the home on the fourth floor       Number of children: 2       Years of education:       Highest education level: Not on file    Exercise: yoga,  walking     Social Needs       Financial resource strain: Not on file       Food insecurity - worry: Not on file       Food insecurity - inability: Not on file       Transportation needs - medical: Not on file       Transportation needs - non-medical: Not on file     Occupational History       Not on file     Tobacco Use       Smoking status: Never Smoker       Smokeless tobacco: Never Used     Substance and Sexual Activity       Alcohol use: No       Drug use: No       Sexual activity: Not on file     Other Topics       Concerns:         Not on file     Social History Narrative       Not on file     Family History  Problem Relation Age of Onset  . Heart failure Mother   . Heart disease Father   . Arthritis Sister     Health Maintenance  Topic Date Due  . Samul Dada  05/04/1948  . DEXA SCAN  05/04/1996  . INFLUENZA VACCINE  01/12/2018  . PNA vac Low Risk Adult  Completed    Allergies  Allergen Reactions  . Ace Inhibitors   . Flagyl [Metronidazole]   . Penicillins     Outpatient Encounter Medications as of 12/06/2017  Medication Sig  . acetaminophen (TYLENOL) 325 MG tablet Take 650 mg by mouth every 4 (four) hours as needed for mild pain or headache.  Marland Kitchen aspirin EC 81 MG tablet Take 81 mg by mouth daily.  . Calcium Carb-Cholecalciferol (CALCIUM 500 +D) 500-400 MG-UNIT TABS Take 1 tablet by mouth daily.  . cholecalciferol (VITAMIN D) 1000 units tablet Take 2,000 Units by mouth daily.  . furosemide (LASIX) 20 MG tablet Take 1 tablet (20 mg total) by mouth daily.  . Multiple Vitamin (MULTIVITAMIN WITH MINERALS) TABS tablet Take 1 tablet by mouth daily.  . potassium chloride (K-DUR) 10 MEQ tablet Take 1 tablet (10 mEq total) by mouth daily.  Marland Kitchen UNABLE TO FIND every other day. Med Name: Silver Dressing. Cleanse wound to lower right leg with normal saline. Then cleanse the area with  wound skin prep. Allow to dry. Cut and place a piece of Kerra Cel (Silver dressing) to  the wound. Then cover with a Tefla self adhesive dressing.  . valsartan (DIOVAN) 160 MG tablet Take 160 mg by mouth daily.  . vitamin C (ASCORBIC ACID) 250 MG tablet Take 250 mg by mouth daily.   No facility-administered encounter medications on file as of 12/06/2017.     Review of Systems  Constitutional: Negative for appetite change, chills and fever.  HENT: Negative for congestion.   Respiratory: Negative for cough and shortness of breath.   Cardiovascular: Positive for leg swelling. Negative for chest pain and palpitations.  Musculoskeletal:       Unsteady gait, uses walker for ambulation  Skin: Negative for rash.  Neurological: Negative for dizziness and headaches.    Vitals:   12/06/17 0823  BP: 126/70  Pulse: 77  Resp: 18  Temp: 99.4 F (37.4 C)  TempSrc: Oral  SpO2: 98%  Weight: 113 lb 3.2 oz (51.3 kg)  Height: _0  (1.6 m)   Body mass index is 20.05 kg/m.   Wt Readings from Last 3 Encounters:  12/06/17 113 lb 3.2 oz (51.3 kg)  11/22/17 115 lb 6.4 oz (52.3 kg)  11/14/17 120 lb (54.4 kg)   Physical Exam  Constitutional: She is oriented to person, place, and time. She appears well-developed. No distress.  Thin built and frail  HENT:  Head: Normocephalic and atraumatic.  Mouth/Throat: Oropharynx is clear and moist.  Eyes: Conjunctivae are normal. Right eye exhibits no discharge. Left eye exhibits no discharge.  Neck: Neck supple.  Cardiovascular: Normal rate, regular rhythm and intact distal pulses.  Pulmonary/Chest: Effort normal and breath sounds normal. She has no wheezes. She has no rales.  Abdominal: Soft. Bowel sounds are normal. There is no tenderness.  Musculoskeletal: She exhibits edema.  Able to move all 4 extremities, 1+ pitting edema to both legs right > left, no pen wound to either legs but has clear drainage from right leg area. No calf tenderness. Palpable dp.   Neurological: She is alert and oriented to person, place, and time. She exhibits  normal muscle tone.  Skin: Skin is warm and dry. She is not diaphoretic. No erythema.  Psychiatric: She has a normal mood and affect.    Labs reviewed: Basic Metabolic Panel: Recent Labs    08/16/17 0705 09/08/17 0730 10/13/17  NA 138 137 138  K 4.5 4.1 4.2  CL 104 104  --   CO2 30 29  --   GLUCOSE 88 72  --   BUN 29* 26* 27*  CREATININE 1.05* 1.03* 1.0  CALCIUM 9.7 9.9  --    Liver Function Tests: Recent Labs    08/16/17 0705 09/08/17 0730 10/13/17  AST _1 ALT _2 ALKPHOS  --   --  60  BILITOT 0.5 0.5  --   PROT 6.8 6.6  --    No results for input(s): LIPASE, AMYLASE in the last 8760 hours. No results for input(s): AMMONIA in the last 8760 hours. CBC: Recent Labs    08/16/17 0705 09/08/17 0730 10/13/17 10/17/17  WBC 6.4 6.2 5.2 6.0  NEUTROABS 3,507 3,708  --   --   HGB 11.4* 11.0* 9.2* 9.2*  HCT 33.1* 32.7* 28* 27*  MCV 94.8 96.7  --   --   PLT 305 249 285 280   Cardiac Enzymes: No results for input(s): CKTOTAL, CKMB, CKMBINDEX, TROPONINI in the  last 8760 hours. BNP: Invalid input(s): POCBNP No results found for: HGBA1C Lab Results  Component Value Date   TSH 1.59 10/05/2016   No results found for: VITAMINB12 No results found for: FOLATE Lab Results  Component Value Date   FERRITIN 121 10/17/2017    Lipid Panel: Recent Labs    08/16/17 0705  CHOL 159  HDL 73  LDLCALC 73  TRIG 53  CHOLHDL 2.2   No results found for: HGBA1C  Procedures since last visit: No results found.  Assessment/Plan  1. PVD (peripheral vascular disease) (HCC) Asymmetrical edema right > left, DVT has been ruled out. She is s/p right patella fracture. Improved edema from last visit with furosemide. Given her drainage, will provide compression wrap with profore. Will need home helath nurse to help with profore dressing. Increase dosing of lasix and kcl as below. Lab prior to next visit in 4 week. Spoke with nurse for [facility], she will help apply profore today  and then make arrangement for home health nurse to help with further changes.  - furosemide (LASIX) 20 MG tablet; Take 1 tablet (20 mg total) by mouth 2 (two) times daily.  Dispense: 60 tablet; Refill: 3 - BMP with eGFR([facility]); Future - potassium chloride (K-DUR) 10 MEQ tablet; Take 1 tablet (10 mEq total) by mouth 2 (two) times daily.  Dispense: 60 tablet; Refill: 3 - Compression Bandages (PROFORE) MISC; 1 application by Does not apply route 2 (two) times a week.  Dispense: 20 each; Refill: 0   Labs/tests ordered:   Lab Orders     BMP with eGFR([facility])  Next appointment: 4 weeks  Communication: reviewed care plan with patient and charge nurse.     Blanchie Serve, MD Internal Medicine [facility] [address] [place], [place] [address] Cell Phone (Monday-Friday 8 am - 5 pm): [phone] On Call: [phone] and follow prompts after 5 pm and on weekends Office Phone: [phone] Office Fax: [phone]

## 2017-12-06 NOTE — Telephone Encounter (Signed)
Both granddaughters came by the clinic to ask about the dressing. I've looked in the patient's document list and neither of the granddaughters are on the designated party release form.

## 2017-12-07 DIAGNOSIS — M79643 Pain in unspecified hand: Secondary | ICD-10-CM | POA: Diagnosis not present

## 2017-12-07 DIAGNOSIS — R278 Other lack of coordination: Secondary | ICD-10-CM | POA: Diagnosis not present

## 2017-12-07 DIAGNOSIS — C3412 Malignant neoplasm of upper lobe, left bronchus or lung: Secondary | ICD-10-CM | POA: Diagnosis not present

## 2017-12-07 DIAGNOSIS — M25559 Pain in unspecified hip: Secondary | ICD-10-CM | POA: Diagnosis not present

## 2017-12-07 DIAGNOSIS — I214 Non-ST elevation (NSTEMI) myocardial infarction: Secondary | ICD-10-CM | POA: Diagnosis not present

## 2017-12-07 DIAGNOSIS — I10 Essential (primary) hypertension: Secondary | ICD-10-CM | POA: Diagnosis not present

## 2017-12-07 NOTE — Telephone Encounter (Signed)
I spoke with [facility] on [address] and the pharmacy only carries over the counter compression supplies( socks and stockings). They suggested that I call [facility].   I spoke with [facility] and they only carry compression cooling bandages.

## 2017-12-08 NOTE — Telephone Encounter (Signed)
Spoke with Tinika the [facility] nurse for [facility] and she stated that [facility] to the Profore compression bandages due to the fact the do not keep those in stock. Patient is to get the bandages today.

## 2017-12-09 DIAGNOSIS — I10 Essential (primary) hypertension: Secondary | ICD-10-CM | POA: Diagnosis not present

## 2017-12-09 DIAGNOSIS — I214 Non-ST elevation (NSTEMI) myocardial infarction: Secondary | ICD-10-CM | POA: Diagnosis not present

## 2017-12-09 DIAGNOSIS — M79643 Pain in unspecified hand: Secondary | ICD-10-CM | POA: Diagnosis not present

## 2017-12-09 DIAGNOSIS — C3412 Malignant neoplasm of upper lobe, left bronchus or lung: Secondary | ICD-10-CM | POA: Diagnosis not present

## 2017-12-09 DIAGNOSIS — R278 Other lack of coordination: Secondary | ICD-10-CM | POA: Diagnosis not present

## 2017-12-09 DIAGNOSIS — M25559 Pain in unspecified hip: Secondary | ICD-10-CM | POA: Diagnosis not present

## 2017-12-13 ENCOUNTER — Encounter: Payer: Medicare Other | Admitting: Internal Medicine

## 2017-12-26 ENCOUNTER — Other Ambulatory Visit: Payer: Self-pay

## 2017-12-26 DIAGNOSIS — R600 Localized edema: Secondary | ICD-10-CM

## 2017-12-26 DIAGNOSIS — D649 Anemia, unspecified: Secondary | ICD-10-CM

## 2017-12-27 DIAGNOSIS — I739 Peripheral vascular disease, unspecified: Secondary | ICD-10-CM

## 2018-01-03 ENCOUNTER — Encounter: Payer: Self-pay | Admitting: Internal Medicine

## 2018-01-03 ENCOUNTER — Non-Acute Institutional Stay: Payer: Medicare Other | Admitting: Internal Medicine

## 2018-01-03 ENCOUNTER — Encounter: Payer: Medicare Other | Admitting: Internal Medicine

## 2018-01-03 VITALS — BP 124/62 | HR 68 | Temp 98.2°F | Resp 18 | Ht 63.0 in | Wt 115.2 lb

## 2018-01-03 DIAGNOSIS — I739 Peripheral vascular disease, unspecified: Secondary | ICD-10-CM | POA: Diagnosis not present

## 2018-01-03 DIAGNOSIS — R195 Other fecal abnormalities: Secondary | ICD-10-CM

## 2018-01-03 MED ORDER — FUROSEMIDE 20 MG PO TABS: 20.0000 mg | ORAL_TABLET | Freq: Every day | ORAL | 3 refills | Status: DC

## 2018-01-03 MED ORDER — LOPERAMIDE HCL 2 MG PO TABS: 2.0000 mg | ORAL_TABLET | Freq: Four times a day (QID) | ORAL | 0 refills | Status: DC | PRN

## 2018-01-03 NOTE — Patient Instructions (Signed)
  Keep yourself hydrated.  Take lasix 20 mg once a day and potassium supplement 10 meq once a day for now. Wear your compression socks during daytime.  If you have more than 2 loose bowel movement, take imodium as prescribed. If frequency of loose stool increases or your develop belly cramp, nausea or vomiting, contact our office to be seen.

## 2018-01-03 NOTE — Progress Notes (Signed)
[facility]  Provider: Blanchie Serve MD   Location:  [place] of Service:  Clinic (12)  PCP: Blanchie Serve, MD Patient Care Team: Blanchie Serve, MD as PCP - General (Internal Medicine) Mast, Man X, NP as Nurse Practitioner (Internal Medicine)  Extended Emergency Contact Information Primary Emergency Contact: Skeels,Floyd Address: [address]          [id number]          [place], [place] [address] [place] Phone: [phone] Relation: Spouse Secondary Emergency Contact: Laughman,[name] Address: [place],  [address] [name] of [place] Phone: [phone] Work Phone: [phone] Mobile Phone: [phone] Relation: Daughter   Goals of Care: Advanced Directive information Advanced Directives 01/03/2018  Does Patient Have a Medical Advance Directive? Yes  Type of Paramedic of [place];Out of facility DNR (pink MOST or yellow form)  Does patient want to make changes to medical advance directive? No - Patient declined  Copy of [place] in Chart? Yes  Pre-existing out of facility DNR order (yellow form or pink MOST form) Yellow form placed in chart (order not valid for inpatient use);Pink MOST form placed in chart (order not valid for inpatient use)      Chief Complaint  Patient presents with  . Acute Visit    loose stool, f/u on leg edema    HPI: Patient is a 84 y.o. female seen today for acute visit. She was seen for worsening PVD with weeping from her legs about 4 weeks back. Today seen for follow up. She has self reduced her lasix from 20 mg bid to once a day and kcl from 10 meq bid to once a day. She has had drastic improvement with profore wrap and now is off the wrap and wearing compression stockings. She has started having loose stool on and off for 2-3 weeks now. She has the urge to defecate right  after meals. Has explosive bowel movement with loose stool. Has 2-3 loose stool in a day. No bowel movement today. Denies mucus or blood in stool. Denies abdominal pain or cramp. Occasional bloating sensation.   Past Medical History:  Diagnosis Date  . Hypertension    History reviewed. No pertinent surgical history.  reports that she has quit smoking. She quit after 12.00 years of use. She has never used smokeless tobacco. She reports that she drank alcohol. She reports that she does not use drugs. Social History   Socioeconomic History  . Marital status: Married    Spouse name: Tyrone Nine  . Number of children: 2  . Years of education: Not on file  . Highest education level: Not on file  Occupational History  . Occupation: Pharmacist, hospital    Comment: retired  Scientific laboratory technician  . Financial resource strain: Not hard at all  . Food insecurity:    Worry: Never true    Inability: Never true  . Transportation needs:    Medical: No    Non-medical: No  Tobacco Use  . Smoking status: Former Smoker    Years: 12.00  . Smokeless tobacco: Never Used  Substance and Sexual Activity  . Alcohol use: Not Currently    Frequency: Never  . Drug use: No  . Sexual activity: Not on file  Lifestyle  . Physical activity:  Days per week: 0 days    Minutes per session: 0 min  . Stress: Only a little  Relationships  . Social connections:    Talks on phone: More than three times a week    Gets together: More than three times a week    Attends religious service: Never    Active member of club or organization: No    Attends meetings of clubs or organizations: Never    Relationship status: Married  . Intimate partner violence:    Fear of current or ex partner: No    Emotionally abused: No    Physically abused: No    Forced sexual activity: No  Other Topics Concern  . Not on file  Social History Narrative   Social History     Socioeconomic History       Marital status: Married   09/01/1954       Spouse  name: Tyrone Nine   Two people stay in the home on the fourth floor       Number of children: 2       Years of education:       Highest education level: Not on file    Exercise: yoga, walking     Social Needs       Financial resource strain: Not on file       Food insecurity - worry: Not on file       Food insecurity - inability: Not on file       Transportation needs - medical: Not on file       Transportation needs - non-medical: Not on file     Occupational History       Not on file     Tobacco Use       Smoking status: Never Smoker       Smokeless tobacco: Never Used     Substance and Sexual Activity       Alcohol use: No       Drug use: No       Sexual activity: Not on file     Other Topics       Concerns:         Not on file     Social History Narrative       Not on file    Functional Status Survey:    Family History  Problem Relation Age of Onset  . Heart failure Mother   . Heart disease Father   . Arthritis Sister     Health Maintenance  Topic Date Due  . Samul Dada  05/04/1948  . DEXA SCAN  05/04/1996  . INFLUENZA VACCINE  01/12/2018  . PNA vac Low Risk Adult  Completed    Allergies  Allergen Reactions  . Ace Inhibitors   . Flagyl [Metronidazole]   . Penicillins     Outpatient Encounter Medications as of 01/03/2018  Medication Sig  . acetaminophen (TYLENOL) 325 MG tablet Take 650 mg by mouth every 4 (four) hours as needed for mild pain or headache.  Marland Kitchen aspirin EC 81 MG tablet Take 81 mg by mouth daily.  . Calcium Carb-Cholecalciferol (CALCIUM 500 +D) 500-400 MG-UNIT TABS Take 1 tablet by mouth daily.  . cholecalciferol (VITAMIN D) 1000 units tablet Take 2,000 Units by mouth daily.  . Compression Bandages (PROFORE) MISC 1 application by Does not apply route 2 (two) times a week.  . furosemide (LASIX) 20 MG tablet Take 1 tablet (20 mg total) by mouth daily.  Marland Kitchen  Multiple Vitamin (MULTIVITAMIN WITH MINERALS) TABS tablet Take 1 tablet by mouth daily.  .  potassium chloride (K-DUR) 10 MEQ tablet Take 10 mEq by mouth daily.  . valsartan (DIOVAN) 160 MG tablet Take 160 mg by mouth daily.  . vitamin C (ASCORBIC ACID) 250 MG tablet Take 250 mg by mouth daily.  . [DISCONTINUED] furosemide (LASIX) 20 MG tablet Take 1 tablet (20 mg total) by mouth 2 (two) times daily.  . [DISCONTINUED] UNABLE TO FIND every other day. Med Name: Silver Dressing. Cleanse wound to lower right leg with normal saline. Then cleanse the area with wound skin prep. Allow to dry. Cut and place a piece of Kerra Cel (Silver dressing) to the wound. Then cover with a Tefla self adhesive dressing.  . loperamide (IMODIUM A-D) 2 MG tablet Take 1 tablet (2 mg total) by mouth 4 (four) times daily as needed for diarrhea or loose stools.  . [DISCONTINUED] potassium chloride (K-DUR) 10 MEQ tablet Take 1 tablet (10 mEq total) by mouth 2 (two) times daily.   No facility-administered encounter medications on file as of 01/03/2018.     Review of Systems  Constitutional: Negative for chills and fever.  HENT: Negative for trouble swallowing.   Respiratory: Negative for shortness of breath.   Cardiovascular: Negative for chest pain and palpitations.  Gastrointestinal: Negative for abdominal pain, anal bleeding, blood in stool, constipation, nausea, rectal pain and vomiting.  Genitourinary: Negative for dysuria and frequency.  Musculoskeletal: Positive for gait problem.  Skin: Negative for rash and wound.  Neurological: Negative for dizziness and headaches.  Psychiatric/Behavioral: Negative for behavioral problems and confusion.    Vitals:   01/03/18 0914  BP: 124/62  Pulse: 68  Resp: 18  Temp: 98.2 F (36.8 C)  TempSrc: Oral  SpO2: 98%  Weight: 115 lb 3.2 oz (52.3 kg)  Height: '5\' 3"'  (1.6 m)   Body mass index is 20.41 kg/m.   Wt Readings from Last 3 Encounters:  01/03/18 115 lb 3.2 oz (52.3 kg)  12/06/17 113 lb 3.2 oz (51.3 kg)  11/22/17 115 lb 6.4 oz (52.3 kg)   Physical Exam    Constitutional: She is oriented to person, place, and time. No distress.  Thin built, frail, elderly female  HENT:  Head: Normocephalic and atraumatic.  Mouth/Throat: Oropharynx is clear and moist.  Neck: Neck supple.  Cardiovascular: Normal rate and regular rhythm.  Pulmonary/Chest: Effort normal and breath sounds normal. She has no wheezes.  Abdominal: Soft. Bowel sounds are normal. She exhibits no distension. There is no tenderness. There is no rebound and no guarding.  Musculoskeletal: She exhibits edema.  Uses a cane, can move all 4 extremities, trace leg edema  Neurological: She is alert and oriented to person, place, and time.  Skin: Skin is warm and dry. She is not diaphoretic.  Chronic stasis changes, no open skin or breakdown this visit  Psychiatric: She has a normal mood and affect. Her behavior is normal.    Labs reviewed: Basic Metabolic Panel: Recent Labs    08/16/17 0705 09/08/17 0730 10/13/17  NA 138 137 138  K 4.5 4.1 4.2  CL 104 104  --   CO2 30 29  --   GLUCOSE 88 72  --   BUN 29* 26* 27*  CREATININE 1.05* 1.03* 1.0  CALCIUM 9.7 9.9  --    Liver Function Tests: Recent Labs    08/16/17 0705 09/08/17 0730 10/13/17  AST '19 19 18  ' ALT 11 11 14  ALKPHOS  --   --  60  BILITOT 0.5 0.5  --   PROT 6.8 6.6  --    No results for input(s): LIPASE, AMYLASE in the last 8760 hours. No results for input(s): AMMONIA in the last 8760 hours. CBC: Recent Labs    08/16/17 0705 09/08/17 0730 10/13/17 10/17/17  WBC 6.4 6.2 5.2 6.0  NEUTROABS 3,507 3,708  --   --   HGB 11.4* 11.0* 9.2* 9.2*  HCT 33.1* 32.7* 28* 27*  MCV 94.8 96.7  --   --   PLT 305 249 285 280   Cardiac Enzymes: No results for input(s): CKTOTAL, CKMB, CKMBINDEX, TROPONINI in the last 8760 hours. BNP: Invalid input(s): POCBNP No results found for: HGBA1C Lab Results  Component Value Date   TSH 1.59 10/05/2016   No results found for: VITAMINB12 No results found for: FOLATE Lab Results   Component Value Date   FERRITIN 121 10/17/2017    Lipid Panel: Recent Labs    08/16/17 0705  CHOL 159  HDL 73  LDLCALC 73  TRIG 53  CHOLHDL 2.2   No results found for: HGBA1C  Procedures since last visit: No results found.  Assessment/Plan  1. PVD (peripheral vascular disease) (HCC) Improved, continue skin care and compression stocking, change lasix to 20 mg daily and kcl to 10 meq daily as this is how pt is taking medication at present and this has been helpful. Monitor leg edema and for skin breakdown. labs - furosemide (LASIX) 20 MG tablet; Take 1 tablet (20 mg total) by mouth daily.  Dispense: 60 tablet; Refill: 3 - CMP with eGFR([facility]); Future - Magnesium; Future  2. Loose stools No recent use of antibiotic, no recent travel, no new medication or dietary changes, afebrile, no other abdominal symptom reported. has been under a lot of stress with husband's decline in health and transition to hospice care. Concern for possible IBS. Imodium 2 mg four times a day as needed for now for loose stool. Lab check for lyte abnormality. If no improvement or has worsening of symptom, to reassess - CMP with eGFR([facility]); Future - Magnesium; Future    Labs/tests ordered:   Lab Orders     CMP with eGFR([facility])     Magnesium   Next appointment: 4 weeks with [facility]  Communication: reviewed care plan with patient and charge nurse.     Blanchie Serve, MD Internal Medicine [facility] [address] [place], [place] [address] Cell Phone (Monday-Friday 8 am - 5 pm): [phone] On Call: [phone] and follow prompts after 5 pm and on weekends Office Phone: [phone] Office Fax: [phone]

## 2018-01-05 DIAGNOSIS — I739 Peripheral vascular disease, unspecified: Secondary | ICD-10-CM | POA: Diagnosis not present

## 2018-01-05 DIAGNOSIS — R195 Other fecal abnormalities: Secondary | ICD-10-CM | POA: Diagnosis not present

## 2018-01-05 DIAGNOSIS — R600 Localized edema: Secondary | ICD-10-CM | POA: Diagnosis not present

## 2018-01-05 LAB — COMPLETE METABOLIC PANEL WITH GFR
AG RATIO: 1.6 (calc) (ref 1.0–2.5)
ALT: 10 U/L (ref 6–29)
AST: 18 U/L (ref 10–35)
Albumin: 3.9 g/dL (ref 3.6–5.1)
Alkaline phosphatase (APISO): 59 U/L (ref 33–130)
BILIRUBIN TOTAL: 0.5 mg/dL (ref 0.2–1.2)
BUN/Creatinine Ratio: 27 (calc) — ABNORMAL HIGH (ref 6–22)
BUN: 28 mg/dL — ABNORMAL HIGH (ref 7–25)
CHLORIDE: 105 mmol/L (ref 98–110)
CO2: 26 mmol/L (ref 20–32)
Calcium: 9.4 mg/dL (ref 8.6–10.4)
Creat: 1.05 mg/dL — ABNORMAL HIGH (ref 0.60–0.88)
GFR, Est African American: 56 mL/min/{1.73_m2} — ABNORMAL LOW (ref >=60)
GFR, Est Non African American: 48 mL/min/{1.73_m2} — ABNORMAL LOW (ref >=60)
Globulin: 2.5 g/dL (calc) (ref 1.9–3.7)
Glucose, Bld: 66 mg/dL (ref 65–99)
POTASSIUM: 4 mmol/L (ref 3.5–5.3)
Sodium: 138 mmol/L (ref 135–146)
Total Protein: 6.4 g/dL (ref 6.1–8.1)

## 2018-01-05 LAB — MAGNESIUM: MAGNESIUM: 2 mg/dL (ref 1.5–2.5)

## 2018-01-25 ENCOUNTER — Encounter: Payer: Self-pay | Admitting: Internal Medicine

## 2018-02-09 ENCOUNTER — Encounter: Payer: Self-pay | Admitting: Nurse Practitioner

## 2018-02-09 ENCOUNTER — Non-Acute Institutional Stay: Payer: Medicare Other | Admitting: Nurse Practitioner

## 2018-02-09 DIAGNOSIS — I872 Venous insufficiency (chronic) (peripheral): Secondary | ICD-10-CM

## 2018-02-09 DIAGNOSIS — D649 Anemia, unspecified: Secondary | ICD-10-CM

## 2018-02-09 DIAGNOSIS — I10 Essential (primary) hypertension: Secondary | ICD-10-CM | POA: Diagnosis not present

## 2018-02-09 DIAGNOSIS — N3942 Incontinence without sensory awareness: Secondary | ICD-10-CM

## 2018-02-09 NOTE — Assessment & Plan Note (Signed)
Bathroom trips 2x/night average, wears adult pads.

## 2018-02-09 NOTE — Assessment & Plan Note (Signed)
Blood pressure today is 140/92 mmHg, she is asymptomatic, continue Valsartan 160mg  po daily, observe.

## 2018-02-09 NOTE — Progress Notes (Signed)
Location:   [facility]   Place of Service:  Clinic (12) Provider: Marlana Latus NP  Code Status: DNR Goals of Care: [place] DNR MOST Advanced Directives 01/03/2018  Does Patient Have a Medical Advance Directive? Yes  Type of Paramedic of [place];Out of facility DNR (pink MOST or yellow form)  Does patient want to make changes to medical advance directive? No - Patient declined  Copy of [place] in Chart? Yes  Pre-existing out of facility DNR order (yellow form or pink MOST form) Yellow form placed in chart (order not valid for inpatient use);Pink MOST form placed in chart (order not valid for inpatient use)     Chief Complaint  Patient presents with  . Medical Management of Chronic Issues    3 mo f/u-  HPI: Patient is a 84 y.o. female seen today for medical management of chronic diseases.     The patient has history of HTN, blood pressure today 140/92, she denied headache, change of vision, chest pain/pressure, palpitation, or SOB. On Valsartan 160mg  qd. BLE edema is managed on Furosemide 20mg  qd. Anemia, last Hgb 9.2 10/17/17, no apparent bleeding, denied fatigue or dizziness. Chronic edema BLE is well managed on Furosemide 20mg  qd  Past Medical History:  Diagnosis Date  . Hypertension     History reviewed. No pertinent surgical history.  Allergies  Allergen Reactions  . Ace Inhibitors   . Flagyl [Metronidazole]   . Penicillins Swelling    facial     Allergies as of 02/09/2018      Reactions   Ace Inhibitors    Flagyl [metronidazole]    Penicillins Swelling   facial      Medication List        Accurate as of 02/09/18 11:59 PM. Always use your most recent med list.          acetaminophen 325 MG tablet Commonly known as:  TYLENOL Take 650 mg by mouth every 4 (four) hours as needed for mild pain or headache.   aspirin EC 81 MG tablet Take 81 mg by mouth daily.   CALCIUM 500 +D 500-400 MG-UNIT Tabs Generic drug:  Calcium Carb-Cholecalciferol Take 1 tablet by mouth daily.   cholecalciferol 1000 units tablet Commonly known as:  VITAMIN D Take 2,000 Units by mouth daily.   furosemide 20 MG tablet Commonly known as:  LASIX Take 1 tablet (20 mg total) by mouth daily.   loperamide 2 MG tablet Commonly known as:  IMODIUM A-D Take 1 tablet (2 mg total) by mouth 4 (four) times daily as needed for diarrhea or loose stools.   multivitamin with minerals Tabs tablet Take 1 tablet by mouth daily.   potassium chloride 10 MEQ tablet Commonly known as:  K-DUR Take 10 mEq by mouth daily.   valsartan 160 MG tablet Commonly known as:  DIOVAN Take 160 mg by mouth daily.   vitamin C 250 MG tablet Commonly known as:  ASCORBIC ACID Take 250 mg by mouth daily.       Review of Systems:  Review of Systems  Constitutional: Negative for activity change, appetite change, chills, diaphoresis, fatigue and fever.  HENT: Positive for hearing loss. Negative for congestion, trouble swallowing and voice change.   Respiratory: Negative for cough, shortness of breath and wheezing.   Cardiovascular: Positive for leg swelling. Negative for chest pain and palpitations.  Gastrointestinal: Negative for abdominal distention and abdominal pain.  Genitourinary: Negative for difficulty urinating, dysuria and urgency.       Incontinent urine  Musculoskeletal: Positive for gait problem.  Neurological: Negative for dizziness, speech difficulty,  weakness and headaches.       Memory lapses.   Psychiatric/Behavioral: Negative for agitation, behavioral problems, confusion, hallucinations and sleep disturbance. The patient is not nervous/anxious.     Health Maintenance  Topic Date Due  . TETANUS/TDAP  05/04/1948  . DEXA SCAN  05/04/1996  . INFLUENZA VACCINE  01/12/2018  . PNA vac Low Risk Adult  Completed    Physical Exam: Vitals:   02/09/18 1437  BP: (!) 140/92  Pulse: 86  Resp: 18  Temp: 98.2 F (36.8 C)  SpO2: 97%  Weight: 110 lb (49.9 kg)  Height: 5\' 3"  (1.6 m)   Body mass index is 19.49 kg/m. Physical Exam  Constitutional: She  is oriented to person, place, and time. She appears well-developed and well-nourished.  HENT:  Head: Normocephalic and atraumatic.  Eyes: Pupils are equal, round, and reactive to light. EOM are normal.  Neck: Normal range of motion. Neck supple. No JVD present. No thyromegaly present.  Cardiovascular: Normal rate and regular rhythm.  No murmur heard. Pulmonary/Chest: She has no wheezes. She has no rales.  Abdominal: Soft. Bowel sounds are normal. She exhibits no distension. There is no tenderness.  Musculoskeletal: Normal range of motion. She exhibits edema.  Trace edema BLE. Ambulates with cane.   Neurological: She is alert and oriented to person, place, and time. No cranial nerve deficit. She exhibits normal muscle tone. Coordination normal.  Skin: Skin is warm and dry.  Psychiatric: She has a normal mood and affect. Her behavior is normal. Judgment and thought content normal.    Labs reviewed: Basic Metabolic Panel: Recent Labs    08/16/17 0705 09/08/17 0730 10/13/17 01/05/18 0000  NA 138 137 138 138  K 4.5 4.1 4.2 4.0  CL 104 104  --  105  CO2 30 29  --  26  GLUCOSE 88 72  --  66  BUN 29* 26* 27* 28*  CREATININE 1.05* 1.03* 1.0 1.05*  CALCIUM 9.7 9.9  --  9.4  MG  --   --   --  2.0   Liver Function Tests: Recent Labs    08/16/17 0705 09/08/17 0730 10/13/17  01/05/18 0000  AST 19 19 18 18   ALT 11 11 14 10   ALKPHOS  --   --  60  --   BILITOT 0.5 0.5  --  0.5  PROT 6.8 6.6  --  6.4   No results for input(s): LIPASE, AMYLASE in the last 8760 hours. No results for input(s): AMMONIA in the last 8760 hours. CBC: Recent Labs    08/16/17 0705 09/08/17 0730 10/13/17 10/17/17  WBC 6.4 6.2 5.2 6.0  NEUTROABS 3,507 3,708  --   --   HGB 11.4* 11.0* 9.2* 9.2*  HCT 33.1* 32.7* 28* 27*  MCV 94.8 96.7  --   --   PLT 305 249 285 280   Lipid Panel: Recent Labs    08/16/17 0705  CHOL 159  HDL 73  LDLCALC 73  TRIG 53  CHOLHDL 2.2   No results found for: HGBA1C  Procedures since last visit: No results found.  Assessment/Plan  Hypertension Blood pressure today is 140/92 mmHg, she is asymptomatic, continue Valsartan 160mg  po daily, observe.   Venous insufficiency (chronic) (peripheral) Only trace edema BLE, on compression hosiery, no open wounds, continue Furosemide 20mg  qd.   Incontinent of urine Bathroom trips 2x/night average, wears adult pads.   Anemia Last Hgb 9.2 10/17/17, update CBC, no s/s of bleeding.    Labs/tests ordered: CBC   Plan of care reviewed with the patient and her POA, DNR and MOST replaced and signed today(the previous documents were destroyed accidentally by the POA)  Next appt:  4 months

## 2018-02-09 NOTE — Patient Instructions (Signed)
The patient is recommended to monitor blood pressure at home since her BP is 140/92 mmHg today, call to clinic [place] if blood pressure is elevated persistently or she experience symptoms of headaches, dizziness,  change of vision, chest pain/pressure, shortness of breath, palpitation, nausea, vomiting, or limb weakness. Repeat CBC to f/u anemia. F/u in clinic in 4 months.

## 2018-02-09 NOTE — Assessment & Plan Note (Signed)
Last Hgb 9.2 10/17/17, update CBC, no s/s of bleeding.

## 2018-02-09 NOTE — Assessment & Plan Note (Signed)
Only trace edema BLE, on compression hosiery, no open wounds, continue Furosemide 20mg  qd.

## 2018-03-09 DIAGNOSIS — H02834 Dermatochalasis of left upper eyelid: Secondary | ICD-10-CM | POA: Diagnosis not present

## 2018-03-09 DIAGNOSIS — H01025 Squamous blepharitis left lower eyelid: Secondary | ICD-10-CM | POA: Diagnosis not present

## 2018-03-09 DIAGNOSIS — H01022 Squamous blepharitis right lower eyelid: Secondary | ICD-10-CM | POA: Diagnosis not present

## 2018-03-09 DIAGNOSIS — H02831 Dermatochalasis of right upper eyelid: Secondary | ICD-10-CM | POA: Diagnosis not present

## 2018-03-09 DIAGNOSIS — G245 Blepharospasm: Secondary | ICD-10-CM | POA: Diagnosis not present

## 2018-03-09 DIAGNOSIS — Z961 Presence of intraocular lens: Secondary | ICD-10-CM | POA: Diagnosis not present

## 2018-03-09 DIAGNOSIS — H01021 Squamous blepharitis right upper eyelid: Secondary | ICD-10-CM | POA: Diagnosis not present

## 2018-03-09 DIAGNOSIS — H01024 Squamous blepharitis left upper eyelid: Secondary | ICD-10-CM | POA: Diagnosis not present

## 2018-03-30 ENCOUNTER — Encounter: Payer: Medicare Other | Admitting: Nurse Practitioner

## 2018-03-30 DIAGNOSIS — M25511 Pain in right shoulder: Secondary | ICD-10-CM | POA: Insufficient documentation

## 2018-03-30 NOTE — Progress Notes (Signed)
Location:   clinic [place]   Place of Service:   clinic [place] Provider: Marlana Latus NP  Code Status: DNR Goals of Care: [place] 01/03/2018  Does Patient Have a Medical Advance Directive? Yes  Type of Paramedic of [place];Out of facility DNR (pink MOST or yellow form)  Does patient want to make changes to medical advance directive? No - Patient declined  Copy of [place] in Chart? Yes  Pre-existing out of facility DNR order (yellow form or pink MOST form) Yellow form placed in chart (order not valid for inpatient use);Pink MOST form placed in chart (order not valid for inpatient use)     Chief Complaint  Patient presents with  . Error    HPI: Patient is a 84 y.o. female seen today for an acute visit for gradual onset of the right shoulder pain with ROM, otherwise not interfering her ADL or resting. Hx of anemia, Hgb 9s in 10/2017, she denied abd pain, nausea, vomiting, blood in stool, or signs of bleeding. CKD, creat 1.05 01/05/18, on Furosemide 20mg  for venous insufficiency, chronic edema BLE. Hx of HTN, blood pressure is controlled on Valsartan 160mg  daily.   Past Medical History:  Diagnosis Date  . Hypertension     No past surgical history on file.  Allergies  Allergen Reactions  . Ace Inhibitors   . Flagyl [Metronidazole]   . Penicillins Swelling    facial    Allergies as of 03/30/2018      Reactions   Ace Inhibitors    Flagyl [metronidazole]    Penicillins Swelling   facial      Medication List        Accurate as of 03/30/18  3:44 PM. Always use your most recent med list.          acetaminophen 325 MG tablet Commonly known as:  TYLENOL Take 650 mg by mouth every 4 (four) hours as needed for mild pain or headache.   aspirin EC 81 MG tablet Take 81 mg by mouth daily.   CALCIUM 500 +D 500-400 MG-UNIT Tabs Generic drug:  Calcium Carb-Cholecalciferol Take 1 tablet by mouth daily.   cholecalciferol  1000 units tablet Commonly known as:  VITAMIN D Take 2,000 Units by mouth daily.   furosemide 20 MG tablet Commonly known as:  LASIX Take 1 tablet (20 mg total) by mouth daily.   loperamide 2 MG tablet Commonly known as:  IMODIUM A-D Take 1 tablet (2 mg total) by mouth 4 (four) times daily as needed for diarrhea or loose stools.   multivitamin with minerals Tabs tablet Take 1 tablet by mouth daily.   potassium chloride 10 MEQ tablet Commonly known as:  K-DUR Take 10 mEq by mouth daily.   valsartan 160 MG tablet Commonly known as:  DIOVAN Take 160 mg by mouth daily.   vitamin C 250 MG tablet Commonly known as:  ASCORBIC ACID Take 250 mg by mouth daily.       Review of Systems:  Review of Systems  Constitutional: Negative for activity change, appetite change, chills, diaphoresis, fatigue and fever.  HENT: Positive for hearing loss. Negative for congestion and voice change.   Respiratory: Negative for cough, shortness of breath and wheezing.   Cardiovascular: Positive for leg swelling. Negative for chest pain and palpitations.  Gastrointestinal: Negative for abdominal distention, abdominal pain, constipation, diarrhea and nausea.  Genitourinary: Negative for difficulty urinating, dysuria and urgency.  Musculoskeletal: Positive for arthralgias and  gait problem.  Skin: Negative for color change and pallor.  Neurological: Negative for dizziness, speech difficulty and weakness.  Psychiatric/Behavioral: Negative for agitation, behavioral problems, hallucinations and sleep disturbance. The patient is not nervous/anxious.     Health Maintenance  Topic Date Due  . TETANUS/TDAP  05/04/1948  . DEXA SCAN  05/04/1996  . INFLUENZA VACCINE  01/12/2018  . PNA vac Low Risk Adult  Completed    Physical Exam: There were no vitals filed for this visit. There is no height or weight on file to calculate BMI. Physical Exam  Constitutional: She appears well-developed and well-nourished.   HENT:  Head: Normocephalic and atraumatic.  Eyes: Pupils are equal, round, and reactive to light. EOM are normal.  Neck: Normal range of motion. Neck supple. No JVD present. No thyromegaly present.  Cardiovascular: Normal rate and regular rhythm.  No murmur heard. Pulmonary/Chest: Effort normal. She has no wheezes. She has no rales.  Abdominal: Soft. There is no tenderness. There is no rebound and no guarding.  Musculoskeletal: She exhibits edema.  Trace edema BLE. Right shoulder pain with ROM  Neurological: She is alert. No cranial nerve deficit. She exhibits normal muscle tone. Coordination normal.  Oriented to person and place.   Skin: Skin is warm and dry.  Psychiatric: She has a normal mood and affect. Her behavior is normal.    Labs reviewed: Basic Metabolic Panel: Recent Labs    08/16/17 0705 09/08/17 0730 10/13/17 01/05/18 0000  NA 138 137 138 138  K 4.5 4.1 4.2 4.0  CL 104 104  --  105  CO2 30 29  --  26  GLUCOSE 88 72  --  66  BUN 29* 26* 27* 28*  CREATININE 1.05* 1.03* 1.0 1.05*  CALCIUM 9.7 9.9  --  9.4  MG  --   --   --  2.0   Liver Function Tests: Recent Labs    08/16/17 0705 09/08/17 0730 10/13/17 01/05/18 0000  AST 19 19 18 18   ALT 11 11 14 10   ALKPHOS  --   --  60  --   BILITOT 0.5 0.5  --  0.5  PROT 6.8 6.6  --  6.4   No results for input(s): LIPASE, AMYLASE in the last 8760 hours. No results for input(s): AMMONIA in the last 8760 hours. CBC: Recent Labs    08/16/17 0705 09/08/17 0730 10/13/17 10/17/17  WBC 6.4 6.2 5.2 6.0  NEUTROABS 3,507 3,708  --   --   HGB 11.4* 11.0* 9.2* 9.2*  HCT 33.1* 32.7* 28* 27*  MCV 94.8 96.7  --   --   PLT 305 249 285 280   Lipid Panel: Recent Labs    08/16/17 0705  CHOL 159  HDL 73  LDLCALC 73  TRIG 53  CHOLHDL 2.2   No results found for: HGBA1C  Procedures since last visit: No results found.  Assessment/Plan Right shoulder pain Gradual onset, new, ROB with pain/aches, not interfereing  sleeps or her baseline function, will have PT to evaluate and treat as indicated. Will X-ray or Ortho consultation is no better. Continue Prn Tylenol for pain and discomfort as needed.   Hypertension Blood pressure is in control, continue Valsartan 160mg  Furosemide 20mg  qd.   Anemia 10/17/17 Hgb 9.2, 10/13/17 Hgb 9.2, 09/08/17 11.0, 08/16/17 11.4. She denied abd pain, nausea, vomiting, blood in stools, or signs of bleeding, will repeat CBC CMP.   CKD (chronic kidney disease) stage 3, GFR 30-59 ml/min (HCC) Last creat  1.05 01/05/18, update CMP, Furosemide may be contributory.     Labs/tests ordered:  CBC CMP  Next appt:  06/13/2018  Time spend 25 minutes.  This encounter was created in error - please disregard. This encounter was created in error - please disregard.

## 2018-03-30 NOTE — Assessment & Plan Note (Signed)
Blood pressure is in control, continue Valsartan 160mg  Furosemide 20mg  qd.

## 2018-03-30 NOTE — Patient Instructions (Addendum)
PT to evaluate and treat as indicated. Update CBC CMP next week.

## 2018-03-30 NOTE — Assessment & Plan Note (Signed)
Last creat 1.05 01/05/18, update CMP, Furosemide may be contributory.

## 2018-03-30 NOTE — Assessment & Plan Note (Addendum)
Gradual onset, new, ROB with pain/aches, not interfereing sleeps or her baseline function, will have PT to evaluate and treat as indicated. Will X-ray or Ortho consultation is no better. Continue Prn Tylenol for pain and discomfort as needed.

## 2018-03-30 NOTE — Assessment & Plan Note (Signed)
10/17/17 Hgb 9.2, 10/13/17 Hgb 9.2, 09/08/17 11.0, 08/16/17 11.4. She denied abd pain, nausea, vomiting, blood in stools, or signs of bleeding, will repeat CBC CMP.

## 2018-06-13 ENCOUNTER — Other Ambulatory Visit: Payer: Medicare Other

## 2018-06-13 DIAGNOSIS — D649 Anemia, unspecified: Secondary | ICD-10-CM

## 2018-06-13 LAB — CBC
HEMATOCRIT: 33.5 % — AB (ref 35.0–45.0)
HEMOGLOBIN: 11 g/dL — AB (ref 11.7–15.5)
MCH: 32.1 pg (ref 27.0–33.0)
MCHC: 32.8 g/dL (ref 32.0–36.0)
MCV: 97.7 fL (ref 80.0–100.0)
MPV: 10.9 fL (ref 7.5–12.5)
Platelets: 215 10*3/uL (ref 140–400)
RBC: 3.43 10*6/uL — AB (ref 3.80–5.10)
RDW: 11.9 % (ref 11.0–15.0)
WBC: 5.2 10*3/uL (ref 3.8–10.8)

## 2018-06-15 ENCOUNTER — Encounter: Payer: Self-pay | Admitting: Nurse Practitioner

## 2018-06-15 ENCOUNTER — Non-Acute Institutional Stay: Payer: Medicare Other | Admitting: Nurse Practitioner

## 2018-06-15 DIAGNOSIS — I10 Essential (primary) hypertension: Secondary | ICD-10-CM

## 2018-06-15 DIAGNOSIS — N183 Chronic kidney disease, stage 3 unspecified: Secondary | ICD-10-CM

## 2018-06-15 DIAGNOSIS — D649 Anemia, unspecified: Secondary | ICD-10-CM | POA: Diagnosis not present

## 2018-06-15 MED ORDER — VALSARTAN 160 MG PO TABS: 160.0000 mg | ORAL_TABLET | Freq: Every day | ORAL | 5 refills | Status: DC

## 2018-06-15 NOTE — Progress Notes (Signed)
Location:   Clinic [facility]   Place of Service:  Clinic (12) Provider: Marlana Latus NP  Code Status: DNR Goals of Care: [place] 01/03/2018  Does Patient Have a Medical Advance Directive? Yes  Type of Paramedic of [place];Out of facility DNR (pink MOST or yellow form)  Does patient want to make changes to medical advance directive? No - Patient declined  Copy of [place] in Chart? Yes  Pre-existing out of facility DNR order (yellow form or pink MOST form) Yellow form placed in chart (order not valid for inpatient use);Pink MOST form placed in chart (order not valid for inpatient use)     Chief Complaint  Patient presents with  . Medical Management of Chronic Issues    4 mo f/u    HPI: Patient is a 85 y.o. female seen today for medical management of chronic diseases.     The patient has history for HTN, blood pressure is not well controlled, stopped taking Valsartan 164m when the prescription run out, on  Furosemide 250mqd. She denied headache, dizziness, change of vision, chest pain/pressure, palpitation, or nausea/vomiting. Chronic venous insufficiency is stable on Furosemide 2071md. CKD, last creat 1.05 01/05/18. Hgb 11.0 06/13/09 Past Medical History:  Diagnosis Date  . Hypertension     History reviewed. No pertinent surgical history.  Allergies  Allergen Reactions  . Ace Inhibitors   . Flagyl [Metronidazole]   . Penicillins Swelling    facial    Allergies as of 06/15/2018      Reactions   Ace Inhibitors    Flagyl [metronidazole]    Penicillins Swelling   facial      Medication List       Accurate as of June 15, 2018 11:59 PM. Always use your most recent med list.        acetaminophen 325 MG tablet Commonly known as:  TYLENOL Take 650 mg by mouth every 4 (four) hours as needed for mild pain or headache.   aspirin EC 81 MG tablet Take 81 mg by mouth daily.   CALCIUM 500 +D 500-400 MG-UNIT  Tabs Generic drug:  Calcium Carb-Cholecalciferol Take 1 tablet by mouth daily.   cholecalciferol 25 MCG (1000 UT) tablet Commonly known as:  VITAMIN D Take 2,000 Units by mouth daily.   furosemide 20 MG tablet Commonly known as:  LASIX Take 1 tablet (20 mg total) by mouth daily.   multivitamin with minerals Tabs tablet Take 1 tablet by mouth daily.   potassium chloride 10 MEQ tablet Commonly known as:  K-DUR Take 10 mEq by mouth daily.   valsartan 160 MG tablet Commonly known as:  DIOVAN Take 1 tablet (160 mg total) by mouth daily.   vitamin C 250 MG tablet Commonly known as:  ASCORBIC ACID Take 250 mg by mouth daily.       Review of Systems:  Review of Systems  Constitutional: Negative for activity change, appetite change, chills, diaphoresis, fatigue and fever.  HENT: Positive for hearing loss. Negative for voice change.   Respiratory: Negative for cough, shortness of breath and wheezing.   Cardiovascular: Positive for leg swelling. Negative for chest pain and palpitations.  Gastrointestinal: Negative for abdominal distention, constipation, diarrhea, nausea and vomiting.  Genitourinary: Negative for difficulty urinating, dysuria and urgency.  Musculoskeletal: Positive for arthralgias and gait problem.  Neurological: Negative for dizziness, speech difficulty, weakness and headaches.  Psychiatric/Behavioral: Negative for agitation, behavioral problems, hallucinations and sleep disturbance. The  patient is not nervous/anxious.     Health Maintenance  Topic Date Due  . TETANUS/TDAP  05/04/1948  . DEXA SCAN  05/04/1996  . INFLUENZA VACCINE  Completed  . PNA vac Low Risk Adult  Completed    Physical Exam: Vitals:   06/15/18 1352  BP: (!) 152/100  Pulse: 87  Resp: 18  Temp: (!) 97.4 F (36.3 C)  TempSrc: Oral  SpO2: 97%  Weight: 118 lb 3.2 oz (53.6 kg)  Height: '5\' 3"'  (1.6 m)   Body mass index is 20.94 kg/m. Physical Exam Constitutional:      Appearance:  Normal appearance. She is not ill-appearing or diaphoretic.  HENT:     Head: Normocephalic and atraumatic.     Nose: Nose normal.     Mouth/Throat:     Mouth: Mucous membranes are moist.  Eyes:     Extraocular Movements: Extraocular movements intact.     Pupils: Pupils are equal, round, and reactive to light.  Neck:     Musculoskeletal: Normal range of motion and neck supple.  Cardiovascular:     Rate and Rhythm: Normal rate and regular rhythm.     Heart sounds: No murmur.  Pulmonary:     Effort: Pulmonary effort is normal.     Breath sounds: Normal breath sounds. No wheezing, rhonchi or rales.  Abdominal:     General: There is no distension.     Palpations: Abdomen is soft.     Tenderness: There is no abdominal tenderness. There is no rebound.  Musculoskeletal:     Right lower leg: Edema present.     Left lower leg: Edema present.     Comments: Ambulates with walker. Trace edema BLE  Skin:    General: Skin is warm and dry.     Coloration: Skin is not pale.     Findings: No erythema or rash.     Comments: Brownish pigmented BLE.   Neurological:     General: No focal deficit present.     Mental Status: She is alert. Mental status is at baseline.     Cranial Nerves: No cranial nerve deficit.     Motor: No weakness.     Coordination: Coordination normal.     Gait: Gait abnormal.     Comments: Oriented to person and place.   Psychiatric:        Mood and Affect: Mood normal.        Behavior: Behavior normal.     Labs reviewed: Basic Metabolic Panel: Recent Labs    08/16/17 0705 09/08/17 0730 10/13/17 01/05/18 0000  NA 138 137 138 138  K 4.5 4.1 4.2 4.0  CL 104 104  --  105  CO2 30 29  --  26  GLUCOSE 88 72  --  66  BUN 29* 26* 27* 28*  CREATININE 1.05* 1.03* 1.0 1.05*  CALCIUM 9.7 9.9  --  9.4  MG  --   --   --  2.0   Liver Function Tests: Recent Labs    08/16/17 0705 09/08/17 0730 10/13/17 01/05/18 0000  AST '19 19 18 18  ' ALT '11 11 14 10  ' ALKPHOS  --    --  60  --   BILITOT 0.5 0.5  --  0.5  PROT 6.8 6.6  --  6.4   No results for input(s): LIPASE, AMYLASE in the last 8760 hours. No results for input(s): AMMONIA in the last 8760 hours. CBC: Recent Labs    08/16/17 0705  09/08/17 0730 10/13/17 10/17/17 06/13/18 0715  WBC 6.4 6.2 5.2 6.0 5.2  NEUTROABS 3,507 3,708  --   --   --   HGB 11.4* 11.0* 9.2* 9.2* 11.0*  HCT 33.1* 32.7* 28* 27* 33.5*  MCV 94.8 96.7  --   --  97.7  PLT 305 249 285 280 215   Lipid Panel: Recent Labs    08/16/17 0705  CHOL 159  HDL 73  LDLCALC 73  TRIG 53  CHOLHDL 2.2   No results found for: HGBA1C  Procedures since last visit: No results found.  Assessment/Plan  Hypertension Blood pressure is not well controlled, re start Valsartan 181m qd, continue Furosemide 240mqd. Update CMP eGFR prior to the next appointment. Monitor Bp  CKD (chronic kidney disease) stage 3, GFR 30-59 ml/min (HCC) Last creat 1.05 01/05/18, update eGFR prior to the next appointment.   Anemia Stable, last Hgb Hgb 11.0 06/13/18, update CBC prior to the next appointment.    Labs/tests ordered: CBC, CMP eGFR prior to the next appointment. tDAP prescription provided.   Next appt:  6 months

## 2018-06-15 NOTE — Assessment & Plan Note (Addendum)
Blood pressure is not well controlled, re start Valsartan 151m qd, continue Furosemide 293mqd. Update CMP eGFR prior to the next appointment. Monitor Bp

## 2018-06-15 NOTE — Assessment & Plan Note (Signed)
Last creat 1.05 01/05/18, update eGFR prior to the next appointment.

## 2018-06-15 NOTE — Assessment & Plan Note (Signed)
Stable, last Hgb Hgb 11.0 06/13/18, update CBC prior to the next appointment.

## 2018-06-15 NOTE — Patient Instructions (Addendum)
CBC CMP eGFR prior to the next appointment in 6 months. Blood pressure is not well controlled, 152/126mHg today. Re-start taking Valsartan 1667mqd, monitor Bp

## 2018-06-21 ENCOUNTER — Other Ambulatory Visit: Payer: Self-pay | Admitting: *Deleted

## 2018-06-21 DIAGNOSIS — I739 Peripheral vascular disease, unspecified: Secondary | ICD-10-CM

## 2018-06-21 MED ORDER — FUROSEMIDE 20 MG PO TABS: 20.0000 mg | ORAL_TABLET | Freq: Every day | ORAL | 1 refills | Status: DC

## 2018-06-21 NOTE — Telephone Encounter (Signed)
[facility] 

## 2018-08-21 ENCOUNTER — Other Ambulatory Visit: Payer: Self-pay | Admitting: *Deleted

## 2018-08-21 MED ORDER — POTASSIUM CHLORIDE ER 10 MEQ PO TBCR: 10.0000 meq | EXTENDED_RELEASE_TABLET | Freq: Every day | ORAL | 1 refills | Status: DC

## 2018-08-21 NOTE — Telephone Encounter (Signed)
[facility] 

## 2018-08-31 ENCOUNTER — Non-Acute Institutional Stay: Payer: Medicare Other | Admitting: Nurse Practitioner

## 2018-08-31 ENCOUNTER — Other Ambulatory Visit: Payer: Self-pay

## 2018-08-31 ENCOUNTER — Encounter: Payer: Self-pay | Admitting: Nurse Practitioner

## 2018-08-31 DIAGNOSIS — N183 Chronic kidney disease, stage 3 unspecified: Secondary | ICD-10-CM

## 2018-08-31 DIAGNOSIS — I10 Essential (primary) hypertension: Secondary | ICD-10-CM

## 2018-08-31 DIAGNOSIS — I872 Venous insufficiency (chronic) (peripheral): Secondary | ICD-10-CM

## 2018-08-31 DIAGNOSIS — R269 Unspecified abnormalities of gait and mobility: Secondary | ICD-10-CM

## 2018-08-31 DIAGNOSIS — R195 Other fecal abnormalities: Secondary | ICD-10-CM | POA: Insufficient documentation

## 2018-08-31 DIAGNOSIS — D649 Anemia, unspecified: Secondary | ICD-10-CM | POA: Diagnosis not present

## 2018-08-31 DIAGNOSIS — R159 Full incontinence of feces: Secondary | ICD-10-CM | POA: Diagnosis not present

## 2018-08-31 NOTE — Assessment & Plan Note (Addendum)
Usually 2-3x after dinner, not diarrhea, no abd pain, nausea, vomiting, or generalized malaise associated with the event. Will update CBC/diff, CMP/eGFR. Adding dietary Fibercon 621m II daily, may increase up to 8 tabs a day.

## 2018-08-31 NOTE — Assessment & Plan Note (Signed)
Continue to ambulates with walker.

## 2018-08-31 NOTE — Assessment & Plan Note (Addendum)
Stable, last Hgb 11.0 06/13/18, update CBC

## 2018-08-31 NOTE — Assessment & Plan Note (Signed)
For 2-3 months, occurs w/o warring sign, will have add dietary Fibercon daily.

## 2018-08-31 NOTE — Patient Instructions (Addendum)
Adding dietary Fibercon 669m II po daily, may increase up to total 8 tablets a day. Update  CBC/diff,  CMP/eGFR

## 2018-08-31 NOTE — Assessment & Plan Note (Signed)
Stable, no open wounds, trace edema BLE.

## 2018-08-31 NOTE — Progress Notes (Signed)
Location:   clinic [place]   Place of Service:  Clinic (12) Provider: Marlana Latus NP  Code Status: DNR Goals of Care: [place] 01/03/2018  Does Patient Have a Medical Advance Directive? Yes  Type of Paramedic of [place];Out of facility DNR (pink MOST or yellow form)  Does patient want to make changes to medical advance directive? No - Patient declined  Copy of [place] in Chart? Yes  Pre-existing out of facility DNR order (yellow form or pink MOST form) Yellow form placed in chart (order not valid for inpatient use);Pink MOST form placed in chart (order not valid for inpatient use)     Chief Complaint  Patient presents with  . Acute Visit    C/o -BP issues    HPI: Patient is a 85 y.o. [name] seen today for fecal incontinency, loose stools 2-3x after dinner about a few months, she denied nausea, vomiting, abd pain, generalized malaise.   The patient has history of HTN, blood pressure is controlled on Valsartan 182m qd, Furosemide 247mqd. She ambulates with walker, no recent falls. She was noted to have memory lapses, but function adequately [place] [facility].    Past Medical History:  Diagnosis Date  . Hypertension     History reviewed. No pertinent surgical history.  Allergies  Allergen Reactions  . Ace Inhibitors   . Flagyl [Metronidazole]   . Penicillins Swelling    facial    Allergies as of 08/31/2018      Reactions   Ace Inhibitors    Flagyl [metronidazole]    Penicillins Swelling   facial      Medication List       Accurate as of August 31, 2018 11:59 PM. Always use your most recent med list.        aspirin EC 81 MG tablet Take 81 mg by mouth daily.   Calcium 500 +D 500-400 MG-UNIT Tabs Generic drug:  Calcium Carb-Cholecalciferol Take 1 tablet by mouth daily.   cholecalciferol 25 MCG (1000 UT) tablet Commonly known as:  VITAMIN D Take 2,000 Units by mouth daily.   furosemide 20 MG tablet Commonly  known as:  LASIX Take 1 tablet (20 mg total) by mouth daily.   multivitamin with minerals Tabs tablet Take 1 tablet by mouth daily.   potassium chloride 10 MEQ tablet Commonly known as:  K-DUR Take 1 tablet (10 mEq total) by mouth daily.   valsartan 160 MG tablet Commonly known as:  DIOVAN Take 1 tablet (160 mg total) by mouth daily.   vitamin C 250 MG tablet Commonly known as:  ASCORBIC ACID Take 250 mg by mouth daily.       Review of Systems:  Review of Systems  Constitutional: Negative for activity change, appetite change, chills, diaphoresis and fatigue.  HENT: Negative for congestion and voice change.   Respiratory: Negative for cough, shortness of breath and wheezing.   Cardiovascular: Positive for leg swelling. Negative for chest pain and palpitations.  Gastrointestinal: Negative for abdominal distention, abdominal pain, blood in stool, constipation, diarrhea, nausea and vomiting.       Incontinent of feces. Loose stools 2-3x after dinner.    Genitourinary: Negative for difficulty urinating, dysuria and urgency.  Musculoskeletal: Positive for arthralgias and gait problem.  Skin: Negative for color change and pallor.  Neurological: Negative for dizziness, speech difficulty, weakness and headaches.       Memory lapses.   Psychiatric/Behavioral: Negative for agitation, behavioral problems, hallucinations  and sleep disturbance. The patient is not nervous/anxious.     Health Maintenance  Topic Date Due  . TETANUS/TDAP  05/04/1948  . DEXA SCAN  05/04/1996  . INFLUENZA VACCINE  Completed  . PNA vac Low Risk Adult  Completed    Physical Exam: Vitals:   08/31/18 1356  BP: 130/80  Pulse: (!) 103  Resp: 18  Temp: (!) 97.3 F (36.3 C)  SpO2: 98%  Weight: 115 lb 12.8 oz (52.5 kg)  Height: '5\' 3"'  (1.6 m)   Body mass index is 20.51 kg/m. Physical Exam Constitutional:      General: She is not in acute distress.    Appearance: Normal appearance. She is not  ill-appearing, toxic-appearing or diaphoretic.  HENT:     Head: Normocephalic and atraumatic.     Nose: Nose normal.     Mouth/Throat:     Mouth: Mucous membranes are moist.  Eyes:     Extraocular Movements: Extraocular movements intact.     Pupils: Pupils are equal, round, and reactive to light.  Neck:     Musculoskeletal: Normal range of motion and neck supple.  Cardiovascular:     Rate and Rhythm: Normal rate and regular rhythm.     Heart sounds: No murmur.  Pulmonary:     Effort: Pulmonary effort is normal.     Breath sounds: No rhonchi or rales.  Abdominal:     General: There is no distension.     Palpations: Abdomen is soft.     Tenderness: There is no abdominal tenderness. There is no guarding or rebound.  Musculoskeletal:     Right lower leg: Edema present.     Left lower leg: Edema present.     Comments: Chronic trace edema BLE  Skin:    General: Skin is warm and dry.  Neurological:     General: No focal deficit present.     Mental Status: She is alert. Mental status is at baseline.     Cranial Nerves: No cranial nerve deficit.     Motor: No weakness.     Coordination: Coordination normal.     Gait: Gait abnormal.     Comments: Oriented to person and place. Ambulates with walker.      Labs reviewed: Basic Metabolic Panel: Recent Labs    09/08/17 0730 10/13/17 01/05/18 0000  NA 137 138 138  K 4.1 4.2 4.0  CL 104  --  105  CO2 29  --  26  GLUCOSE 72  --  66  BUN 26* 27* 28*  CREATININE 1.03* 1.0 1.05*  CALCIUM 9.9  --  9.4  MG  --   --  2.0   Liver Function Tests: Recent Labs    09/08/17 0730 10/13/17 01/05/18 0000  AST '19 18 18  ' ALT '11 14 10  ' ALKPHOS  --  60  --   BILITOT 0.5  --  0.5  PROT 6.6  --  6.4   No results for input(s): LIPASE, AMYLASE in the last 8760 hours. No results for input(s): AMMONIA in the last 8760 hours. CBC: Recent Labs    09/08/17 0730 10/13/17 10/17/17 06/13/18 0715  WBC 6.2 5.2 6.0 5.2  NEUTROABS 3,708  --   --    --   HGB 11.0* 9.2* 9.2* 11.0*  HCT 32.7* 28* 27* 33.5*  MCV 96.7  --   --  97.7  PLT 249 285 280 215   Lipid Panel: No results for input(s): CHOL, HDL, LDLCALC, TRIG,  CHOLHDL, LDLDIRECT in the last 8760 hours. No results found for: HGBA1C  Procedures since last visit: No results found.  Assessment/Plan  Hypertension Blood pressures is controlled, continue Valsartan 159m qd, Furosemide 234mqd.   Venous insufficiency (chronic) (peripheral) Stable, no open wounds, trace edema BLE.   Gait abnormality Continue to ambulates with walker.   Anemia Stable, last Hgb 11.0 06/13/18, update CBC  CKD (chronic kidney disease) stage 3, GFR 30-59 ml/min (HCC) Stable, last creat 1.05 01/05/18, update CMP/eGFR  Fecal incontinence For 2-3 months, occurs w/o warring sign, will have add dietary Fibercon daily.   Loose stools Usually 2-3x after dinner, not diarrhea, no abd pain, nausea, vomiting, or generalized malaise associated with the event. Will update CBC/diff, CMP/eGFR. Adding dietary Fibercon 62552mI daily, may increase up to 8 tabs a day.    Labs/tests ordered: CBC, CMP/eGFR prior to the next appointment.   Next appt:  12/12/2018

## 2018-08-31 NOTE — Assessment & Plan Note (Signed)
Blood pressures is controlled, continue Valsartan 160mg  qd, Furosemide 20mg  qd.

## 2018-08-31 NOTE — Assessment & Plan Note (Signed)
Stable, last creat 1.05 01/05/18, update CMP/eGFR

## 2018-09-04 DIAGNOSIS — N183 Chronic kidney disease, stage 3 (moderate): Secondary | ICD-10-CM | POA: Diagnosis not present

## 2018-09-04 DIAGNOSIS — D649 Anemia, unspecified: Secondary | ICD-10-CM | POA: Diagnosis not present

## 2018-09-04 DIAGNOSIS — I10 Essential (primary) hypertension: Secondary | ICD-10-CM | POA: Diagnosis not present

## 2018-09-04 DIAGNOSIS — R195 Other fecal abnormalities: Secondary | ICD-10-CM | POA: Diagnosis not present

## 2018-09-05 ENCOUNTER — Other Ambulatory Visit: Payer: Medicare Other

## 2018-09-05 ENCOUNTER — Other Ambulatory Visit: Payer: Self-pay

## 2018-09-05 DIAGNOSIS — R195 Other fecal abnormalities: Secondary | ICD-10-CM

## 2018-09-05 DIAGNOSIS — N183 Chronic kidney disease, stage 3 unspecified: Secondary | ICD-10-CM

## 2018-09-05 DIAGNOSIS — D649 Anemia, unspecified: Secondary | ICD-10-CM

## 2018-09-05 DIAGNOSIS — I10 Essential (primary) hypertension: Secondary | ICD-10-CM

## 2018-09-05 LAB — CBC WITH DIFFERENTIAL/PLATELET
Absolute Monocytes: 415 cells/uL (ref 200–950)
BASOS ABS: 50 {cells}/uL (ref 0–200)
BASOS PCT: 1 %
EOS ABS: 240 {cells}/uL (ref 15–500)
Eosinophils Relative: 4.8 %
HEMATOCRIT: 34.1 % — AB (ref 35.0–45.0)
HEMOGLOBIN: 11.3 g/dL — AB (ref 11.7–15.5)
LYMPHS ABS: 1370 {cells}/uL (ref 850–3900)
MCH: 31.8 pg (ref 27.0–33.0)
MCHC: 33.1 g/dL (ref 32.0–36.0)
MCV: 96.1 fL (ref 80.0–100.0)
MPV: 10.7 fL (ref 7.5–12.5)
Monocytes Relative: 8.3 %
NEUTROS ABS: 2925 {cells}/uL (ref 1500–7800)
Neutrophils Relative %: 58.5 %
Platelets: 219 10*3/uL (ref 140–400)
RBC: 3.55 10*6/uL — ABNORMAL LOW (ref 3.80–5.10)
RDW: 11.9 % (ref 11.0–15.0)
Total Lymphocyte: 27.4 %
WBC: 5 10*3/uL (ref 3.8–10.8)

## 2018-09-05 LAB — COMPLETE METABOLIC PANEL WITH GFR
AG RATIO: 1.5 (calc) (ref 1.0–2.5)
ALKALINE PHOSPHATASE (APISO): 59 U/L (ref 37–153)
ALT: 13 U/L (ref 6–29)
AST: 21 U/L (ref 10–35)
Albumin: 4 g/dL (ref 3.6–5.1)
BUN/Creatinine Ratio: 28 (calc) — ABNORMAL HIGH (ref 6–22)
BUN: 30 mg/dL — ABNORMAL HIGH (ref 7–25)
CO2: 29 mmol/L (ref 20–32)
Calcium: 9.9 mg/dL (ref 8.6–10.4)
Chloride: 104 mmol/L (ref 98–110)
Creat: 1.09 mg/dL — ABNORMAL HIGH (ref 0.60–0.88)
GFR, Est African American: 53 mL/min/{1.73_m2} — ABNORMAL LOW (ref >=60)
GFR, Est Non African American: 46 mL/min/{1.73_m2} — ABNORMAL LOW (ref >=60)
Globulin: 2.6 g/dL (calc) (ref 1.9–3.7)
Glucose, Bld: 76 mg/dL (ref 65–99)
Potassium: 4.2 mmol/L (ref 3.5–5.3)
Sodium: 138 mmol/L (ref 135–146)
Total Bilirubin: 0.5 mg/dL (ref 0.2–1.2)
Total Protein: 6.6 g/dL (ref 6.1–8.1)

## 2018-10-22 ENCOUNTER — Other Ambulatory Visit: Payer: Self-pay | Admitting: Internal Medicine

## 2018-10-22 DIAGNOSIS — I739 Peripheral vascular disease, unspecified: Secondary | ICD-10-CM

## 2018-11-20 DIAGNOSIS — G245 Blepharospasm: Secondary | ICD-10-CM | POA: Diagnosis not present

## 2018-11-20 DIAGNOSIS — H02834 Dermatochalasis of left upper eyelid: Secondary | ICD-10-CM | POA: Diagnosis not present

## 2018-11-20 DIAGNOSIS — H02054 Trichiasis without entropian left upper eyelid: Secondary | ICD-10-CM | POA: Diagnosis not present

## 2018-12-02 ENCOUNTER — Other Ambulatory Visit: Payer: Self-pay | Admitting: Nurse Practitioner

## 2018-12-02 DIAGNOSIS — I10 Essential (primary) hypertension: Secondary | ICD-10-CM

## 2018-12-12 ENCOUNTER — Other Ambulatory Visit: Payer: Medicare Other

## 2018-12-14 ENCOUNTER — Encounter: Payer: Medicare Other | Admitting: Nurse Practitioner

## 2019-01-01 ENCOUNTER — Other Ambulatory Visit: Payer: Self-pay | Admitting: Internal Medicine

## 2019-01-11 ENCOUNTER — Ambulatory Visit: Payer: Medicare Other | Admitting: Nurse Practitioner

## 2019-01-11 ENCOUNTER — Other Ambulatory Visit: Payer: Self-pay

## 2019-01-11 ENCOUNTER — Encounter: Payer: Self-pay | Admitting: Nurse Practitioner

## 2019-01-11 DIAGNOSIS — I10 Essential (primary) hypertension: Secondary | ICD-10-CM | POA: Diagnosis not present

## 2019-01-11 DIAGNOSIS — L03116 Cellulitis of left lower limb: Secondary | ICD-10-CM | POA: Diagnosis not present

## 2019-01-11 DIAGNOSIS — S9002XA Contusion of left ankle, initial encounter: Secondary | ICD-10-CM | POA: Insufficient documentation

## 2019-01-11 DIAGNOSIS — I872 Venous insufficiency (chronic) (peripheral): Secondary | ICD-10-CM | POA: Diagnosis not present

## 2019-01-11 DIAGNOSIS — L0390 Cellulitis, unspecified: Secondary | ICD-10-CM | POA: Insufficient documentation

## 2019-01-11 DIAGNOSIS — R269 Unspecified abnormalities of gait and mobility: Secondary | ICD-10-CM | POA: Diagnosis not present

## 2019-01-11 MED ORDER — DOXYCYCLINE HYCLATE 100 MG PO TABS: 100.0000 mg | ORAL_TABLET | Freq: Two times a day (BID) | ORAL | 0 refills | Status: DC

## 2019-01-11 NOTE — Assessment & Plan Note (Signed)
Blood pressure is controlled, continue Valsartan 160mg  qd.

## 2019-01-11 NOTE — Assessment & Plan Note (Signed)
Pain with weight bearing, may consider X-ray to r/o fx if no better.

## 2019-01-11 NOTE — Assessment & Plan Note (Signed)
Minimal edema BLE, continue Furosemide 20mg  qd.

## 2019-01-11 NOTE — Progress Notes (Signed)
Location:   clinic [place]   Place of Service:   clinic [place] Provider: Marlana Latus NP  Code Status: DNR Goals of Care: [place] 01/03/2018  Does Patient Have a Medical Advance Directive? Yes  Type of Paramedic of [place];Out of facility DNR (pink MOST or yellow form)  Does patient want to make changes to medical advance directive? No - Patient declined  Copy of [place] in Chart? Yes  Pre-existing out of facility DNR order (yellow form or pink MOST form) Yellow form placed in chart (order not valid for inpatient use);Pink MOST form placed in chart (order not valid for inpatient use)     Chief Complaint  Patient presents with  . Acute Visit    c/o bruising to left ankle area x 2-3 weeks    HPI: Patient is a 85 y.o. female seen today for medical management of chronic diseases.    The patient has history of edema BLE, improved since Furosemide 20mg  qd. Hx of HTN, blood pressure is controlled on Valsartan 160mg  qd. She ambulates with walker, no recent falls. \  Reported left lower leg abrasion/ bruise from bumping to coffee table, traumatic, pain when palpated but no pain with walking/weight beraing,   Past Medical History:  Diagnosis Date  . Hypertension     No past surgical history on file.  Allergies  Allergen Reactions  . Ace Inhibitors   . Flagyl [Metronidazole]   . Penicillins Swelling    facial    Allergies as of 01/11/2019      Reactions   Ace Inhibitors    Flagyl [metronidazole]    Penicillins Swelling   facial      Medication List       Accurate as of January 11, 2019 11:59 PM. If you have any questions, ask your nurse or doctor.        aspirin EC 81 MG tablet Take 81 mg by mouth daily.   Calcium 500 +D 500-400 MG-UNIT Tabs Generic drug: Calcium Carb-Cholecalciferol Take 1 tablet by mouth daily.   cholecalciferol 25 MCG (1000 UT) tablet Commonly known as: VITAMIN D Take 2,000 Units by mouth  daily.   doxycycline 100 MG tablet Commonly known as: VIBRA-TABS Take 1 tablet (100 mg total) by mouth 2 (two) times daily. Started by: Man X Mast, NP   furosemide 20 MG tablet Commonly known as: LASIX TAKE 1 TABLET BY MOUTH  DAILY   multivitamin with minerals Tabs tablet Take 1 tablet by mouth daily.   polycarbophil 625 MG tablet Commonly known as: FIBERCON Take 625 mg by mouth daily.   potassium chloride 10 MEQ tablet Commonly known as: K-DUR Take 1 tablet (10 mEq total) by mouth daily.   valsartan 160 MG tablet Commonly known as: DIOVAN TAKE 1 TABLET BY MOUTH  DAILY   vitamin C 250 MG tablet Commonly known as: ASCORBIC ACID Take 250 mg by mouth daily.       Review of Systems:  Review of Systems  Constitutional: Negative for activity change, appetite change, chills, diaphoresis, fatigue and fever.  HENT: Positive for hearing loss. Negative for congestion and voice change.   Respiratory: Negative for cough, shortness of breath and wheezing.   Cardiovascular: Positive for leg swelling. Negative for chest pain and palpitations.  Gastrointestinal: Negative for abdominal distention, constipation, diarrhea, nausea and vomiting.  Genitourinary: Positive for frequency. Negative for difficulty urinating, dysuria and urgency.  Musculoskeletal: Positive for arthralgias and gait problem.  Skin: Positive for color change and wound.       Left ankle bruise.   Neurological: Negative for dizziness, speech difficulty, weakness and headaches.  Psychiatric/Behavioral: Negative for agitation, behavioral problems, hallucinations and sleep disturbance. The patient is not nervous/anxious.     Health Maintenance  Topic Date Due  . TETANUS/TDAP  05[birth year]04/1948  . DEXA SCAN  05/04/1996  . INFLUENZA VACCINE  01/13/2019  . PNA vac Low Risk Adult  Completed    Physical Exam: Vitals:   01/11/19 1419  BP: 138/78  Pulse: 89  Resp: 20  SpO2: 98%  Weight: 113 lb 6.4 oz (51.4 kg)   Body  mass index is 20.09 kg/m. Physical Exam Vitals signs reviewed.  Constitutional:      Appearance: Normal appearance.  HENT:     Head: Normocephalic and atraumatic.     Nose: Nose normal.     Mouth/Throat:     Mouth: Mucous membranes are moist.  Eyes:     Extraocular Movements: Extraocular movements intact.     Conjunctiva/sclera: Conjunctivae normal.     Pupils: Pupils are equal, round, and reactive to light.  Neck:     Musculoskeletal: Normal range of motion and neck supple.  Cardiovascular:     Rate and Rhythm: Normal rate and regular rhythm.     Heart sounds: No murmur.  Pulmonary:     Effort: Pulmonary effort is normal.     Breath sounds: Normal breath sounds. No wheezing, rhonchi or rales.  Chest:     Chest wall: No tenderness.  Abdominal:     General: Bowel sounds are normal. There is no distension.     Palpations: Abdomen is soft.     Tenderness: There is no abdominal tenderness. There is no right CVA tenderness, guarding or rebound.  Musculoskeletal:        General: No tenderness.     Right lower leg: Edema present.     Left lower leg: Edema present.     Comments: Trace edema BLE  Skin:    General: Skin is warm and dry.     Findings: Bruising and erythema present.     Comments: Medial aspect of the left lower leg above the medial malleolus redness, swelling, tenderness, warmth noted. 2 abrasions mid left shin from bumping to coffee table, scabbed over already. Chronic venous insufficiency skin changes BLE   Neurological:     General: No focal deficit present.     Mental Status: She is alert. Mental status is at baseline.     Cranial Nerves: No cranial nerve deficit.     Motor: No weakness.     Coordination: Coordination normal.     Gait: Gait abnormal.     Comments: Oriented to person and place .  Psychiatric:        Mood and Affect: Mood normal.        Behavior: Behavior normal.        Thought Content: Thought content normal.        Judgment: Judgment  normal.     Labs reviewed: Basic Metabolic Panel: Recent Labs    09/04/18 0000  NA 138  K 4.2  CL 104  CO2 29  GLUCOSE 76  BUN 30*  CREATININE 1.09*  CALCIUM 9.9   Liver Function Tests: Recent Labs    09/04/18 0000  AST 21  ALT 13  BILITOT 0.5  PROT 6.6   No results for input(s): LIPASE, AMYLASE in the last 8760 hours. No results  for input(s): AMMONIA in the last 8760 hours. CBC: Recent Labs    06/13/18 0715 09/04/18 0000  WBC 5.2 5.0  NEUTROABS  --  2,925  HGB 11.0* 11.3*  HCT 33.5* 34.1*  MCV 97.7 96.1  PLT 215 219   Lipid Panel: No results for input(s): CHOL, HDL, LDLCALC, TRIG, CHOLHDL, LDLDIRECT in the last 8760 hours. No results found for: HGBA1C  Procedures since last visit: No results found.  Assessment/Plan  Hypertension Blood pressure is controlled, continue Valsartan 160mg  qd.   Venous insufficiency (chronic) (peripheral) Minimal edema BLE, continue Furosemide 20mg  qd.   Gait abnormality Stable, no recent falls, continue ambulating with walker.   Contusion of left ankle Pain with weight bearing, may consider X-ray to r/o fx if no better.   Cellulitis Medial aspect of the left lower leg above the left malleolus cellulitis, rest, ice, compression, elevation. Will treat with Doxycycline 100mg  bid by mouth for 7 days.  Florastor bid by mouth for 7 days.     Labs/tests ordered:  none  Next appt:  02/15/2019

## 2019-01-11 NOTE — Assessment & Plan Note (Signed)
Stable, no recent falls, continue ambulating with walker.

## 2019-01-11 NOTE — Assessment & Plan Note (Signed)
Medial aspect of the left lower leg above the left malleolus cellulitis, rest, ice, compression, elevation. Will treat with Doxycycline 100mg  bid by mouth for 7 days.  Florastor bid by mouth for 7 days.

## 2019-01-11 NOTE — Patient Instructions (Addendum)
Left lower leg cellulitis, rest, ice, compression, elevation. Will treat with Doxycycline 100mg  bid by mouth for 7 days.  Florastor bid by mouth for 7 days.

## 2019-01-14 ENCOUNTER — Encounter: Payer: Self-pay | Admitting: Nurse Practitioner

## 2019-01-19 ENCOUNTER — Encounter: Payer: Self-pay | Admitting: Internal Medicine

## 2019-01-19 ENCOUNTER — Non-Acute Institutional Stay: Payer: Medicare Other | Admitting: Internal Medicine

## 2019-01-19 ENCOUNTER — Other Ambulatory Visit: Payer: Self-pay

## 2019-01-19 VITALS — BP 120/64 | HR 72 | Temp 98.1°F | Wt 115.4 lb

## 2019-01-19 DIAGNOSIS — I872 Venous insufficiency (chronic) (peripheral): Secondary | ICD-10-CM | POA: Diagnosis not present

## 2019-01-19 DIAGNOSIS — N183 Chronic kidney disease, stage 3 unspecified: Secondary | ICD-10-CM

## 2019-01-19 DIAGNOSIS — L03116 Cellulitis of left lower limb: Secondary | ICD-10-CM | POA: Diagnosis not present

## 2019-01-19 MED ORDER — LEVOFLOXACIN 500 MG PO TABS: 500.0000 mg | ORAL_TABLET | Freq: Every day | ORAL | 0 refills | Status: AC

## 2019-01-19 NOTE — Progress Notes (Signed)
Location: [place] of Service:  Clinic (12)  Provider:   Code Status:  Goals of Care:  Advanced Directives 01/19/2019  Does Patient Have a Medical Advance Directive? Yes  Type of Paramedic of [place];Out of facility DNR (pink MOST or yellow form)  Does patient want to make changes to medical advance directive? No - Patient declined  Copy of [place] in Chart? Yes - validated most recent copy scanned in chart (See row information)  Pre-existing out of facility DNR order (yellow form or pink MOST form) Yellow form placed in chart (order not valid for inpatient use);Pink MOST form placed in chart (order not valid for inpatient use)     Chief Complaint  Patient presents with  . Acute Visit    open wound on left leg    HPI: Patient is a 85 y.o. female seen today for an acute visit for Cellulitis Patient has a history of bilateral lower extremity edema and hypertension with CKD. Patient was seen a week ago by [facility] for lower leg abrasion with possible mild cellulitis.  Patient was seen by facility nurse and was sent to our clinic for follow-up as the redness was still there with the swelling. Patient said that it her leg got better initially but then it started getting red and swollen again again.  She has redness around her middle ankle and the back.  She does have 2 places of abrasion which are very superficial.  She is states she has mild pain in her leg. She denies any fever or chills.  She still walking and has had no falls  Past Medical History:  Diagnosis Date  . Hypertension     No past surgical history on file.  Allergies  Allergen Reactions  . Ace Inhibitors   . Flagyl [Metronidazole]   . Penicillins Swelling    facial    Outpatient Encounter Medications as of 01/19/2019  Medication Sig  . aspirin EC 81 MG tablet Take 81 mg by mouth daily.  . Calcium Carb-Cholecalciferol (CALCIUM 500 +D) 500-400  MG-UNIT TABS Take 1 tablet by mouth daily.  . cholecalciferol (VITAMIN D) 1000 units tablet Take 2,000 Units by mouth daily.  Marland Kitchen doxycycline (VIBRA-TABS) 100 MG tablet Take 1 tablet (100 mg total) by mouth 2 (two) times daily.  . furosemide (LASIX) 20 MG tablet TAKE 1 TABLET BY MOUTH  DAILY  . Multiple Vitamin (MULTIVITAMIN WITH MINERALS) TABS tablet Take 1 tablet by mouth daily.  . polycarbophil (FIBERCON) 625 MG tablet Take 625 mg by mouth daily.  . potassium chloride (K-DUR) 10 MEQ tablet Take 1 tablet (10 mEq total) by mouth daily.  . valsartan (DIOVAN) 160 MG tablet TAKE 1 TABLET BY MOUTH  DAILY  . vitamin C (ASCORBIC ACID) 250 MG tablet Take 250 mg by mouth daily.   No facility-administered encounter medications on file as of 01/19/2019.     Review of Systems:  Review of Systems  Constitutional: Negative.   HENT: Negative.   Respiratory: Negative.   Cardiovascular: Positive for leg swelling.  Gastrointestinal: Negative.   Genitourinary: Negative.   Musculoskeletal: Negative.   Skin: Positive for color change.  Neurological: Negative.   Psychiatric/Behavioral: Negative.     Health Maintenance  Topic Date Due  . TETANUS/TDAP  05/04/1948  . DEXA SCAN  05/04/1996  . INFLUENZA VACCINE  01/13/2019  . PNA vac Low Risk Adult  Completed    Physical Exam: Vitals:  01/19/19 1019  BP: 120/64  Pulse: 72  Temp: 98.1 F (36.7 C)  SpO2: 98%  Weight: 115 lb 6.4 oz (52.3 kg)   Body mass index is 20.44 kg/m. Physical Exam Vitals signs reviewed.  Constitutional:      Appearance: Normal appearance.  HENT:     Head: Normocephalic.     Nose: Nose normal.     Mouth/Throat:     Mouth: Mucous membranes are moist.     Pharynx: Oropharynx is clear.  Eyes:     Pupils: Pupils are equal, round, and reactive to light.  Neck:     Musculoskeletal: Neck supple.  Cardiovascular:     Rate and Rhythm: Normal rate and regular rhythm.     Pulses: Normal pulses.     Heart sounds: Normal  heart sounds.  Pulmonary:     Effort: Pulmonary effort is normal.     Breath sounds: Normal breath sounds.  Abdominal:     General: Abdomen is flat. Bowel sounds are normal.     Palpations: Abdomen is soft.  Musculoskeletal:     Comments: Left Ankle Swollen With redness going up near the Bruise and has 2 superficial abrasions/Skin Tears   Skin:    General: Skin is warm and dry.  Neurological:     General: No focal deficit present.     Mental Status: She is alert.  Psychiatric:        Mood and Affect: Mood normal.     Labs reviewed: Basic Metabolic Panel: Recent Labs    09/04/18 0000  NA 138  K 4.2  CL 104  CO2 29  GLUCOSE 76  BUN 30*  CREATININE 1.09*  CALCIUM 9.9   Liver Function Tests: Recent Labs    09/04/18 0000  AST 21  ALT 13  BILITOT 0.5  PROT 6.6   No results for input(s): LIPASE, AMYLASE in the last 8760 hours. No results for input(s): AMMONIA in the last 8760 hours. CBC: Recent Labs    06/13/18 0715 09/04/18 0000  WBC 5.2 5.0  NEUTROABS  --  2,925  HGB 11.0* 11.3*  HCT 33.5* 34.1*  MCV 97.7 96.1  PLT 215 219   Lipid Panel: No results for input(s): CHOL, HDL, LDLCALC, TRIG, CHOLHDL, LDLDIRECT in the last 8760 hours. No results found for: HGBA1C  Procedures since last visit: No results found.  Assessment/Plan 1. Cellulitis of left lower extremity Doxycycline has not helped. She continues to have redness and swelling. Will start her on Levaquin 500 mg QD for 7 Days I have talked to facility Nurse and she can see her in few days for follow up She will also come to see Manxi in week Go to the hospital if any fever or Chills  2. Venous insufficiency (chronic) (peripheral) Continue to elevate the Leg Dry Dressing for Skin abrasion Avoid Ted hoses for now Continue Lasix   3. CKD (chronic kidney disease) stage 3, GFR 30-59 ml/min (HCC) Creat stable Follow up labs pending    Labs/tests ordered:  * No order type specified * Next appt:   01/25/2019

## 2019-01-25 ENCOUNTER — Other Ambulatory Visit: Payer: Self-pay

## 2019-01-25 ENCOUNTER — Non-Acute Institutional Stay: Payer: Medicare Other | Admitting: Nurse Practitioner

## 2019-01-25 DIAGNOSIS — I872 Venous insufficiency (chronic) (peripheral): Secondary | ICD-10-CM | POA: Diagnosis not present

## 2019-01-25 DIAGNOSIS — L03116 Cellulitis of left lower limb: Secondary | ICD-10-CM | POA: Diagnosis not present

## 2019-01-25 DIAGNOSIS — I10 Essential (primary) hypertension: Secondary | ICD-10-CM

## 2019-01-25 NOTE — Progress Notes (Signed)
Location:   clinic [place]   Place of Service:  Clinic (12) Provider: Marlana Latus NP  Code Status: DNR [place]als of Care: [place] 01/19/2019  Does Patient Have a Medical Advance Directive? Yes  Type of Paramedic of [place];Out of facility DNR (pink MOST or yellow form)  Does patient want to make changes to medical advance directive? No - Patient declined  Copy of [place] in Chart? Yes - validated most recent copy scanned in chart (See row information)  Pre-existing out of facility DNR order (yellow form or pink MOST form) Yellow form placed in chart (order not valid for inpatient use);Pink MOST form placed in chart (order not valid for inpatient use)     Chief Complaint  Patient presents with  . Medical Management of Chronic Issues    F/u- (L) leg wound    HPI: Patient is a 85 y.o. female seen today for an acute visit for left leg cellulitis, will complete 7day course of Levaquin 500mg  qd tomorrow 01/26/19,  failed  Doxycycline. The left lower leg pain, warmth, redness are resolved, some swelling in the area remains.   Hx of HTN, blood pressure is controlled, taking Valsartan 160mg  qd. Chronic edema BLE, trace, taking Furosemide 20mg  qd.   Past Medical History:  Diagnosis Date  . Hypertension     No past surgical history on file.  Allergies  Allergen Reactions  . Ace Inhibitors   . Flagyl [Metronidazole]   . Penicillins Swelling    facial    Allergies as of 01/25/2019      Reactions   Ace Inhibitors    Flagyl [metronidazole]    Penicillins Swelling   facial      Medication List       Accurate as of January 25, 2019 11:59 PM. If you have any questions, ask your nurse or doctor.        aspirin EC 81 MG tablet Take 81 mg by mouth daily.   Calcium 500 +D 500-400 MG-UNIT Tabs Generic drug: Calcium Carb-Cholecalciferol Take 1 tablet by mouth daily.   cholecalciferol 25 MCG (1000 UT) tablet Commonly known as:  VITAMIN D Take 2,000 Units by mouth daily.   doxycycline 100 MG tablet Commonly known as: VIBRA-TABS Take 1 tablet (100 mg total) by mouth 2 (two) times daily.   furosemide 20 MG tablet Commonly known as: LASIX TAKE 1 TABLET BY MOUTH  DAILY   levofloxacin 500 MG tablet Commonly known as: LEVAQUIN Take 1 tablet (500 mg total) by mouth daily for 7 days.   multivitamin with minerals Tabs tablet Take 1 tablet by mouth daily.   polycarbophil 625 MG tablet Commonly known as: FIBERCON Take 625 mg by mouth daily.   potassium chloride 10 MEQ tablet Commonly known as: K-DUR Take 1 tablet (10 mEq total) by mouth daily.   valsartan 160 MG tablet Commonly known as: DIOVAN TAKE 1 TABLET BY MOUTH  DAILY   vitamin C 250 MG tablet Commonly known as: ASCORBIC ACID Take 250 mg by mouth daily.       Review of Systems:  Review of Systems  Constitutional: Negative for activity change, appetite change, chills, diaphoresis and fatigue.  HENT: Positive for hearing loss. Negative for congestion and voice change.   Respiratory: Negative for cough, shortness of breath and wheezing.   Cardiovascular: Positive for leg swelling. Negative for chest pain.  Gastrointestinal: Negative for abdominal distention, constipation, diarrhea, nausea and vomiting.  Genitourinary: Negative for  difficulty urinating and urgency.  Musculoskeletal: Positive for gait problem.  Skin: Positive for color change and wound.  Neurological: Negative for dizziness, speech difficulty, weakness and headaches.       Memory lapses  Psychiatric/Behavioral: Negative for agitation, hallucinations and sleep disturbance. The patient is not nervous/anxious.     Health Maintenance  Topic Date Due  . TETANUS/TDAP  05/04/1948  . DEXA SCAN  05/04/1996  . INFLUENZA VACCINE  01/13/2019  . PNA vac Low Risk Adult  Completed    Physical Exam: Vitals:   01/25/19 1441  BP: 120/70  Pulse: 93  Resp: 20  Temp: 98.1 F (36.7 C)   SpO2: 97%  Weight: 113 lb 6.4 oz (51.4 kg)  Height: 5\' 3"  (1.6 m)   Body mass index is 20.09 kg/m. Physical Exam Constitutional:      General: She is not in acute distress.    Appearance: Normal appearance. She is not ill-appearing, toxic-appearing or diaphoretic.  HENT:     Head: Normocephalic and atraumatic.     Nose: Nose normal.     Mouth/Throat:     Mouth: Mucous membranes are moist.  Eyes:     Extraocular Movements: Extraocular movements intact.     Conjunctiva/sclera: Conjunctivae normal.     Pupils: Pupils are equal, round, and reactive to light.  Neck:     Musculoskeletal: Normal range of motion and neck supple.  Cardiovascular:     Rate and Rhythm: Normal rate and regular rhythm.     Heart sounds: No murmur.  Pulmonary:     Effort: Pulmonary effort is normal.     Breath sounds: No wheezing or rales.  Abdominal:     General: Bowel sounds are normal. There is no distension.     Palpations: Abdomen is soft.     Tenderness: There is no abdominal tenderness. There is no right CVA tenderness, left CVA tenderness, guarding or rebound.  Musculoskeletal:     Right lower leg: Edema present.     Left lower leg: Edema present.     Comments: Trace edema RLE, 1+ edema LLE, ambulates with cane  Skin:    General: Skin is warm and dry.     Comments: 2 scabbed over areas in the left lower leg in mid/medial aspect about a quarter size. No redness, warmth, or tenderness upon my examination. Mid LLE to the left ankle swelling about 1+. DP pulse present. Chronic venous insufficiency skin changes BLE  Neurological:     General: No focal deficit present.     Mental Status: She is alert. Mental status is at baseline.     Cranial Nerves: No cranial nerve deficit.     Motor: No weakness.     Coordination: Coordination normal.     Gait: Gait abnormal.     Comments: Oriented to person and place.  Psychiatric:        Mood and Affect: Mood normal.        Behavior: Behavior normal.         Thought Content: Thought content normal.     Labs reviewed: Basic Metabolic Panel: Recent Labs    09/04/18 0000  NA 138  K 4.2  CL 104  CO2 29  GLUCOSE 76  BUN 30*  CREATININE 1.09*  CALCIUM 9.9   Liver Function Tests: Recent Labs    09/04/18 0000  AST 21  ALT 13  BILITOT 0.5  PROT 6.6   No results for input(s): LIPASE, AMYLASE in the last 8760  hours. No results for input(s): AMMONIA in the last 8760 hours. CBC: Recent Labs    06/13/18 0715 09/04/18 0000  WBC 5.2 5.0  NEUTROABS  --  2,925  HGB 11.0* 11.3*  HCT 33.5* 34.1*  MCV 97.7 96.1  PLT 215 219   Lipid Panel: No results for input(s): CHOL, HDL, LDLCALC, TRIG, CHOLHDL, LDLDIRECT in the last 8760 hours. No results found for: HGBA1C  Procedures since last visit: No results found.  Assessment/Plan Cellulitis Improved, resolved redness/warmth/tenderness, moderate swelling remained, encourage elevation of legs as much as possible throug out day, 2 scabbed over areas mid/medial aspect of the left lower leg needs to be dressed q 3 days. She will complete 7 day course of Levaquin 500mg  qd tomorrow 01/26/19. Observe.   Hypertension Controlled blood pressure, continue Valsartan 160mg  qd.   Venous insufficiency (chronic) (peripheral) Minimal edema BLE. Continue Furosemide 20mg  qd    Labs/tests ordered:  None  Next appt:  2 weeks in clinic [facility]

## 2019-01-25 NOTE — Assessment & Plan Note (Signed)
Controlled blood pressure, continue Valsartan 160mg  qd.

## 2019-01-25 NOTE — Assessment & Plan Note (Addendum)
Improved, resolved redness/warmth/tenderness, moderate swelling remained, encourage elevation of legs as much as possible throug out day, 2 scabbed over areas mid/medial aspect of the left lower leg needs to be dressed q 3 days. She will complete 7 day course of Levaquin 500mg  qd tomorrow 01/26/19. Observe.

## 2019-01-25 NOTE — Patient Instructions (Signed)
Dressing change q 3 days, continue to encourage elevating legs as much as possible throughout day, f/u in clinic [facility] in 2 weeks.

## 2019-01-25 NOTE — Assessment & Plan Note (Signed)
Minimal edema BLE. Continue Furosemide 20mg  qd

## 2019-01-26 ENCOUNTER — Encounter: Payer: Self-pay | Admitting: Nurse Practitioner

## 2019-01-28 ENCOUNTER — Other Ambulatory Visit: Payer: Self-pay | Admitting: Nurse Practitioner

## 2019-01-28 DIAGNOSIS — I10 Essential (primary) hypertension: Secondary | ICD-10-CM

## 2019-02-08 ENCOUNTER — Encounter: Payer: Self-pay | Admitting: Nurse Practitioner

## 2019-02-08 ENCOUNTER — Other Ambulatory Visit: Payer: Self-pay

## 2019-02-08 ENCOUNTER — Non-Acute Institutional Stay: Payer: Medicare Other | Admitting: Nurse Practitioner

## 2019-02-08 DIAGNOSIS — I10 Essential (primary) hypertension: Secondary | ICD-10-CM | POA: Diagnosis not present

## 2019-02-08 DIAGNOSIS — L03116 Cellulitis of left lower limb: Secondary | ICD-10-CM | POA: Diagnosis not present

## 2019-02-08 DIAGNOSIS — I872 Venous insufficiency (chronic) (peripheral): Secondary | ICD-10-CM | POA: Diagnosis not present

## 2019-02-08 NOTE — Patient Instructions (Addendum)
Fragile skin  BLE in setting of swelling from chronic venous insufficiency condition is risk for trauma and infection. Avoid trauma and keep swelling down are important to reduce skin/soft tissue injury and infection.

## 2019-02-08 NOTE — Assessment & Plan Note (Signed)
Improving, trace edema, continue Furosemide 20mg  qd.

## 2019-02-08 NOTE — Progress Notes (Signed)
Location:   clinic [place]   Place of Service:  Clinic (12) Provider: Marlana Latus NP  Code Status: DNR Goals of Care: [place] 01/19/2019  Does Patient Have a Medical Advance Directive? Yes  Type of Paramedic of [place];Out of facility DNR (pink MOST or yellow form)  Does patient want to make changes to medical advance directive? No - Patient declined  Copy of [place] in Chart? Yes - validated most recent copy scanned in chart (See row information)  Pre-existing out of facility DNR order (yellow form or pink MOST form) Yellow form placed in chart (order not valid for inpatient use);Pink MOST form placed in chart (order not valid for inpatient use)     Chief Complaint  Patient presents with  . Medical Management of Chronic Issues    F/u leg wound    HPI: Patient is a 85 y.o. female seen today for f/u for left lower leg cellulitis, completed 7 day course Levaquin 500mg  qd. Healing nicely.   Hx of edema BLE, stable, currently taking Furosemide 20mg  qd. HTN, blood pressure is controlled on Valsartan 160mg  qd.    Past Medical History:  Diagnosis Date  . Hypertension     History reviewed. No pertinent surgical history.  Allergies  Allergen Reactions  . Ace Inhibitors   . Flagyl [Metronidazole]   . Penicillins Swelling    facial    Allergies as of 02/08/2019      Reactions   Ace Inhibitors    Flagyl [metronidazole]    Penicillins Swelling   facial      Medication List       Accurate as of February 08, 2019  2:26 PM. If you have any questions, ask your nurse or doctor.        STOP taking these medications   doxycycline 100 MG tablet Commonly known as: VIBRA-TABS Stopped by: Man X Mast, NP     TAKE these medications   aspirin EC 81 MG tablet Take 81 mg by mouth daily.   Calcium 500 +D 500-400 MG-UNIT Tabs Generic drug: Calcium Carb-Cholecalciferol Take 1 tablet by mouth daily.   cholecalciferol 25 MCG  (1000 UT) tablet Commonly known as: VITAMIN D Take 2,000 Units by mouth daily.   furosemide 20 MG tablet Commonly known as: LASIX TAKE 1 TABLET BY MOUTH  DAILY   multivitamin with minerals Tabs tablet Take 1 tablet by mouth daily.   polycarbophil 625 MG tablet Commonly known as: FIBERCON Take 625 mg by mouth as needed.   potassium chloride 10 MEQ tablet Commonly known as: K-DUR Take 1 tablet (10 mEq total) by mouth daily.   valsartan 160 MG tablet Commonly known as: DIOVAN TAKE 1 TABLET BY MOUTH  DAILY   vitamin C 250 MG tablet Commonly known as: ASCORBIC ACID Take 250 mg by mouth daily.       Review of Systems:  Review of Systems  Constitutional: Negative for activity change, appetite change, chills, diaphoresis, fatigue, fever and unexpected weight change.  HENT: Positive for hearing loss. Negative for congestion and voice change.   Respiratory: Negative for cough, shortness of breath and wheezing.   Cardiovascular: Positive for leg swelling. Negative for chest pain and palpitations.  Gastrointestinal: Negative for abdominal distention, constipation, diarrhea, nausea and vomiting.  Genitourinary: Negative for difficulty urinating, dysuria and urgency.  Musculoskeletal: Positive for gait problem.  Skin: Negative for color change and pallor.  Neurological: Negative for dizziness, speech difficulty, weakness and  headaches.       Memory lapses.   Psychiatric/Behavioral: Negative for agitation, behavioral problems, hallucinations and sleep disturbance. The patient is not nervous/anxious.     Health Maintenance  Topic Date Due  . TETANUS/TDAP  05[birth year]04/1948  . DEXA SCAN  05/04/1996  . INFLUENZA VACCINE  01/13/2019  . PNA vac Low Risk Adult  Completed    Physical Exam: Vitals:   02/08/19 1346  BP: 120/78  Pulse: 99  Resp: 18  Temp: (!) 97.1 F (36.2 C)  SpO2: 98%  Weight: 113 lb 6.4 oz (51.4 kg)  Height: 5\' 3"  (1.6 m)   Body mass index is 20.09 kg/m.  Physical Exam Vitals signs and nursing note reviewed.  Constitutional:      General: She is not in acute distress.    Appearance: Normal appearance. She is not ill-appearing, toxic-appearing or diaphoretic.  HENT:     Head: Normocephalic and atraumatic.     Nose: Nose normal.     Mouth/Throat:     Mouth: Mucous membranes are moist.  Eyes:     Extraocular Movements: Extraocular movements intact.     Conjunctiva/sclera: Conjunctivae normal.     Pupils: Pupils are equal, round, and reactive to light.  Neck:     Musculoskeletal: Normal range of motion and neck supple.  Cardiovascular:     Rate and Rhythm: Normal rate and regular rhythm.     Heart sounds: No murmur.  Pulmonary:     Breath sounds: No wheezing, rhonchi or rales.  Abdominal:     Palpations: Abdomen is soft.     Tenderness: There is no abdominal tenderness. There is no right CVA tenderness, left CVA tenderness, guarding or rebound.  Musculoskeletal:     Right lower leg: Edema present.     Left lower leg: Edema present.     Comments: Trace edema BLE L>R  Skin:    General: Skin is warm and dry.     Comments: Scabbed over medial left lower leg wounds, no s/s of infection. Chronic venous insufficiency skin changes BLE  Neurological:     General: No focal deficit present.     Mental Status: She is alert. Mental status is at baseline.     Motor: No weakness.     Coordination: Coordination normal.     Gait: Gait abnormal.     Comments: Oriented to person, place.   Psychiatric:        Mood and Affect: Mood normal.        Behavior: Behavior normal.        Thought Content: Thought content normal.     Labs reviewed: Basic Metabolic Panel: Recent Labs    09/04/18 0000  NA 138  K 4.2  CL 104  CO2 29  GLUCOSE 76  BUN 30*  CREATININE 1.09*  CALCIUM 9.9   Liver Function Tests: Recent Labs    09/04/18 0000  AST 21  ALT 13  BILITOT 0.5  PROT 6.6   No results for input(s): LIPASE, AMYLASE in the last 8760  hours. No results for input(s): AMMONIA in the last 8760 hours. CBC: Recent Labs    06/13/18 0715 09/04/18 0000  WBC 5.2 5.0  NEUTROABS  --  2,925  HGB 11.0* 11.3*  HCT 33.5* 34.1*  MCV 97.7 96.1  PLT 215 219   Lipid Panel: No results for input(s): CHOL, HDL, LDLCALC, TRIG, CHOLHDL, LDLDIRECT in the last 8760 hours. No results found for: HGBA1C  Procedures since last visit: No  results found.  Assessment/Plan  Venous insufficiency (chronic) (peripheral) Improving, trace edema, continue Furosemide 20mg  qd.   Cellulitis Healing nicely.   Hypertension Blood pressure is controlled, continue Valsartan 160mg  qd.    Labs/tests ordered:  None  Next appt:  02/15/2019

## 2019-02-08 NOTE — Assessment & Plan Note (Signed)
Healing nicely.  

## 2019-02-08 NOTE — Assessment & Plan Note (Signed)
Blood pressure is controlled, continue Valsartan 160mg  qd.

## 2019-02-15 ENCOUNTER — Encounter: Payer: Medicare Other | Admitting: Nurse Practitioner

## 2019-02-22 DIAGNOSIS — H0102B Squamous blepharitis left eye, upper and lower eyelids: Secondary | ICD-10-CM | POA: Diagnosis not present

## 2019-02-22 DIAGNOSIS — Z961 Presence of intraocular lens: Secondary | ICD-10-CM | POA: Diagnosis not present

## 2019-02-22 DIAGNOSIS — H02054 Trichiasis without entropian left upper eyelid: Secondary | ICD-10-CM | POA: Diagnosis not present

## 2019-02-22 DIAGNOSIS — H02831 Dermatochalasis of right upper eyelid: Secondary | ICD-10-CM | POA: Diagnosis not present

## 2019-02-22 DIAGNOSIS — H02834 Dermatochalasis of left upper eyelid: Secondary | ICD-10-CM | POA: Diagnosis not present

## 2019-02-22 DIAGNOSIS — H0102A Squamous blepharitis right eye, upper and lower eyelids: Secondary | ICD-10-CM | POA: Diagnosis not present

## 2019-02-22 DIAGNOSIS — G245 Blepharospasm: Secondary | ICD-10-CM | POA: Diagnosis not present

## 2019-03-08 ENCOUNTER — Non-Acute Institutional Stay: Payer: Medicare Other | Admitting: Nurse Practitioner

## 2019-03-08 ENCOUNTER — Other Ambulatory Visit: Payer: Self-pay

## 2019-03-08 DIAGNOSIS — I872 Venous insufficiency (chronic) (peripheral): Secondary | ICD-10-CM | POA: Diagnosis not present

## 2019-03-08 DIAGNOSIS — R269 Unspecified abnormalities of gait and mobility: Secondary | ICD-10-CM

## 2019-03-08 DIAGNOSIS — I10 Essential (primary) hypertension: Secondary | ICD-10-CM

## 2019-03-08 NOTE — Assessment & Plan Note (Signed)
Chronic edema BLE, no open wounds presently, continue Furosemide 20mg  qd.

## 2019-03-08 NOTE — Progress Notes (Signed)
Location:   clinic [place]   Place of Service:    Provider: Marlana Latus NP  Code Status: DNR Goals of Care[place] 01/19/2019  Does Patient Have a Medical Advance Directive? Yes  Type of Paramedic of [place];Out of facility DNR (pink MOST or yellow form)  Does patient want to make changes to medical advance directive? No - Patient declined  Copy of [place] in Chart? Yes - validated most recent copy scanned in chart (See row information)  Pre-existing out of facility DNR order (yellow form or pink MOST form) Yellow form placed in chart (order not valid for inpatient use);Pink MOST form placed in chart (order not valid for inpatient use)     Chief Complaint  Patient presents with  . Medical Management of Chronic Issues    4 werk f/u    HPI: Patient is a 85 y.o. female seen today for medical management of chronic diseases.     The patient has Hx of edema BLE, on Furosemide 20mg  qd. HTN, blood pressure is controlled on Valsartan 160mg  qd. She ambulates with walker, no recent falls.   Past Medical History:  Diagnosis Date  . Hypertension     No past surgical history on file.  Allergies  Allergen Reactions  . Ace Inhibitors   . Flagyl [Metronidazole]   . Penicillins Swelling    facial    Allergies as of 03/08/2019      Reactions   Ace Inhibitors    Flagyl [metronidazole]    Penicillins Swelling   facial      Medication List       Accurate as of March 08, 2019 11:59 PM. If you have any questions, ask your nurse or doctor.        aspirin EC 81 MG tablet Take 81 mg by mouth daily.   Calcium 500 +D 500-400 MG-UNIT Tabs Generic drug: Calcium Carb-Cholecalciferol Take 1 tablet by mouth daily.   cholecalciferol 25 MCG (1000 UT) tablet Commonly known as: VITAMIN D Take 2,000 Units by mouth daily.   furosemide 20 MG tablet Commonly known as: LASIX TAKE 1 TABLET BY MOUTH  DAILY   multivitamin with  minerals Tabs tablet Take 1 tablet by mouth daily.   polycarbophil 625 MG tablet Commonly known as: FIBERCON Take 625 mg by mouth as needed.   potassium chloride 10 MEQ tablet Commonly known as: K-DUR Take 1 tablet (10 mEq total) by mouth daily.   valsartan 160 MG tablet Commonly known as: DIOVAN TAKE 1 TABLET BY MOUTH  DAILY   vitamin C 250 MG tablet Commonly known as: ASCORBIC ACID Take 250 mg by mouth daily.       Review of Systems:  Review of Systems  Constitutional: Negative for activity change, appetite change, chills, diaphoresis, fatigue and fever.  HENT: Positive for hearing loss. Negative for congestion and voice change.   Respiratory: Negative for cough, shortness of breath and wheezing.   Cardiovascular: Positive for leg swelling. Negative for chest pain and palpitations.  Gastrointestinal: Negative for abdominal distention, abdominal pain, constipation, diarrhea, nausea and vomiting.  Genitourinary: Negative for difficulty urinating, dysuria and urgency.  Musculoskeletal: Positive for gait problem.  Skin: Negative for color change and pallor.  Neurological: Negative for dizziness, speech difficulty, weakness and headaches.       Memory lapses.  Psychiatric/Behavioral: Negative for agitation, behavioral problems, hallucinations and sleep disturbance. The patient is not nervous/anxious.     Health Maintenance  Topic Date Due  . TETANUS/TDAP  05/04/1948  . DEXA SCAN  05/04/1996  . INFLUENZA VACCINE  01/13/2019  . PNA vac Low Risk Adult  Completed    Physical Exam: Vitals:   03/08/19 1550  BP: 122/78  Pulse: (!) 104  Resp: 20  Temp: (!) 94.4 F (34.7 C)  SpO2: 97%  Weight: 117 lb 3.2 oz (53.2 kg)  Height: 5\' 3"  (1.6 m)   Body mass index is 20.76 kg/m. Physical Exam Vitals signs and nursing note reviewed.  Constitutional:      General: She is not in acute distress.    Appearance: Normal appearance. She is not ill-appearing, toxic-appearing or  diaphoretic.  HENT:     Head: Normocephalic and atraumatic.     Nose: Nose normal.     Mouth/Throat:     Mouth: Mucous membranes are moist.  Eyes:     Extraocular Movements: Extraocular movements intact.     Conjunctiva/sclera: Conjunctivae normal.     Pupils: Pupils are equal, round, and reactive to light.  Neck:     Musculoskeletal: Normal range of motion and neck supple.  Cardiovascular:     Rate and Rhythm: Normal rate and regular rhythm.     Heart sounds: No gallop.   Pulmonary:     Breath sounds: No wheezing, rhonchi or rales.  Abdominal:     General: Bowel sounds are normal. There is no distension.     Palpations: Abdomen is soft.     Tenderness: There is no abdominal tenderness. There is no right CVA tenderness, left CVA tenderness, guarding or rebound.  Musculoskeletal:     Right lower leg: Edema present.     Left lower leg: Edema present.     Comments: Trace edema BLE, ambulates cane  Skin:    General: Skin is warm and dry.     Comments: LLE scabbed over 2 areas, no s/s of infection  Neurological:     General: No focal deficit present.     Mental Status: She is alert. Mental status is at baseline.     Cranial Nerves: No cranial nerve deficit.     Motor: No weakness.     Coordination: Coordination normal.     Gait: Gait abnormal.     Comments: Oriented to person, place.   Psychiatric:        Mood and Affect: Mood normal.        Behavior: Behavior normal.        Thought Content: Thought content normal.        Judgment: Judgment normal.     Labs reviewed: Basic Metabolic Panel: Recent Labs    09/04/18 0000  NA 138  K 4.2  CL 104  CO2 29  GLUCOSE 76  BUN 30*  CREATININE 1.09*  CALCIUM 9.9   Liver Function Tests: Recent Labs    09/04/18 0000  AST 21  ALT 13  BILITOT 0.5  PROT 6.6   No results for input(s): LIPASE, AMYLASE in the last 8760 hours. No results for input(s): AMMONIA in the last 8760 hours. CBC: Recent Labs    06/13/18 0715  09/04/18 0000  WBC 5.2 5.0  NEUTROABS  --  2,925  HGB 11.0* 11.3*  HCT 33.5* 34.1*  MCV 97.7 96.1  PLT 215 219   Lipid Panel: No results for input(s): CHOL, HDL, LDLCALC, TRIG, CHOLHDL, LDLDIRECT in the last 8760 hours. No results found for: HGBA1C  Procedures since last visit: No results found.  Assessment/Plan  Venous insufficiency (chronic) (peripheral) Chronic edema BLE, no open wounds presently, continue Furosemide 20mg  qd.   Hypertension Blood pressure is controlled, continue Valsartan 160mg  qd.   Gait abnormality Ambulates with walker.    Labs/tests ordered: none  Next appt: 4 months

## 2019-03-08 NOTE — Patient Instructions (Addendum)
F/u in clinic [facility] 4 months. Obtain Labs prior to the next appointment.

## 2019-03-08 NOTE — Assessment & Plan Note (Signed)
Ambulates with walker.  

## 2019-03-08 NOTE — Assessment & Plan Note (Signed)
Blood pressure is controlled, continue Valsartan 160mg  qd.

## 2019-03-09 ENCOUNTER — Encounter: Payer: Self-pay | Admitting: Nurse Practitioner

## 2019-04-19 ENCOUNTER — Other Ambulatory Visit: Payer: Self-pay | Admitting: Internal Medicine

## 2019-04-19 DIAGNOSIS — I739 Peripheral vascular disease, unspecified: Secondary | ICD-10-CM

## 2019-05-03 ENCOUNTER — Encounter: Payer: Self-pay | Admitting: Nurse Practitioner

## 2019-05-03 ENCOUNTER — Non-Acute Institutional Stay: Payer: Medicare Other | Admitting: Nurse Practitioner

## 2019-05-03 ENCOUNTER — Other Ambulatory Visit: Payer: Self-pay

## 2019-05-03 DIAGNOSIS — N3942 Incontinence without sensory awareness: Secondary | ICD-10-CM | POA: Diagnosis not present

## 2019-05-03 DIAGNOSIS — I872 Venous insufficiency (chronic) (peripheral): Secondary | ICD-10-CM

## 2019-05-03 DIAGNOSIS — I10 Essential (primary) hypertension: Secondary | ICD-10-CM

## 2019-05-03 MED ORDER — MIRABEGRON ER 25 MG PO TB24: 25.0000 mg | ORAL_TABLET | Freq: Every day | ORAL | 1 refills | Status: DC

## 2019-05-03 NOTE — Assessment & Plan Note (Addendum)
Not new, but worsened,  Kegel exercise helped in the past. Obtain CBC/diff CMP/eGFR, UA C/S 05/08/19, will encourage Kegel exercise, try Myrbetriq 25mg qd, f/u in clinic [facility] in 2 weeks.   

## 2019-05-03 NOTE — Assessment & Plan Note (Signed)
Blood pressure is controlled, continue Valsartan 160mg  qd.

## 2019-05-03 NOTE — Assessment & Plan Note (Signed)
Trace edema, continue Furosemide 20mg  qd.

## 2019-05-03 NOTE — Progress Notes (Signed)
Location:   clinic [place]   Place of Service:  Clinic (12) Provider: Marlana Latus NP  Code Status: DNR [place] 01[place]19/2019  Does Patient Have a Medical Advance Directive? Yes  Type of Paramedic of [place];Out of facility DNR (pink MOST or yellow form)  Does patient want to make changes to medical advance directive? No - Patient declined  Copy of [place] in Chart? Yes - validated most recent copy scanned in chart (See row information)  Pre-existing out of facility DNR order (yellow form or pink MOST form) Yellow form placed in chart (order not valid for inpatient use);Pink MOST form placed in chart (order not valid for inpatient use)     Chief Complaint  Patient presents with  . Acute Visit    Bladder leakage     HPI: Patient is a 85 y.o. female seen today for medical management of chronic diseases.    The patient c/o urinary leakage, not new, but worsened, Kegel exercise helped in the past,  denied dysuria, hematuria, or urinary urgency. She is afebrile, denied lower abd or DVA tenderness. Hx of HTN, blood pressure is controlled on Valsartan 119m qd, BLE edema, trace on Furosemide 263mqd .   Past Medical History:  Diagnosis Date  . Hypertension     History reviewed. No pertinent surgical history.  Allergies  Allergen Reactions  . Ace Inhibitors   . Flagyl [Metronidazole]   . Penicillins Swelling    facial    Allergies as of 05/03/2019      Reactions   Ace Inhibitors    Flagyl [metronidazole]    Penicillins Swelling   facial      Medication List       Accurate as of May 03, 2019  4:39 PM. If you have any questions, ask your nurse or doctor.        aspirin EC 81 MG tablet Take 81 mg by mouth daily.   Calcium 500 +D 500-400 MG-UNIT Tabs Generic drug: Calcium Carb-Cholecalciferol Take 1 tablet by mouth daily.   cholecalciferol 25 MCG (1000 UT) tablet Commonly known as: VITAMIN  D Take 2,000 Units by mouth daily.   furosemide 20 MG tablet Commonly known as: LASIX TAKE 1 TABLET BY MOUTH  DAILY   ICAPS AREDS 2 PO Take by mouth 2 (two) times daily.   mirabegron ER 25 MG Tb24 tablet Commonly known as: MYRBETRIQ Take 1 tablet (25 mg total) by mouth daily. Started by: Jenise Iannelli X Joliet Mallozzi, NP   multivitamin with minerals Tabs tablet Take 1 tablet by mouth daily.   polycarbophil 625 MG tablet Commonly known as: FIBERCON Take 625 mg by mouth as needed.   potassium chloride 10 MEQ tablet Commonly known as: KLOR-CON Take 1 tablet (10 mEq total) by mouth daily.   valsartan 160 MG tablet Commonly known as: DIOVAN TAKE 1 TABLET BY MOUTH  DAILY   vitamin C 250 MG tablet Commonly known as: ASCORBIC ACID Take 250 mg by mouth daily.       Review of Systems:  Review of Systems  Constitutional: Negative for activity change, appetite change, chills, diaphoresis, fatigue, fever and unexpected weight change.  HENT: Positive for hearing loss. Negative for congestion and voice change.   Respiratory: Negative for cough, shortness of breath and wheezing.   Cardiovascular: Positive for leg swelling.  Gastrointestinal: Negative for abdominal distention, abdominal pain, constipation, diarrhea, nausea and vomiting.  Genitourinary: Positive for frequency. Negative for  difficulty urinating, dysuria and urgency.       Urinary incontinence.   Musculoskeletal: Positive for gait problem.  Neurological: Negative for dizziness, speech difficulty, weakness and headaches.       Memory lapses.   Psychiatric/Behavioral: Negative for agitation, behavioral problems, hallucinations and sleep disturbance. The patient is not nervous/anxious.     Health Maintenance  Topic Date Due  . TETANUS/TDAP  05/04/1948  . DEXA SCAN  05/04/1996  . INFLUENZA VACCINE  Completed  . PNA vac Low Risk Adult  Completed    Physical Exam: Vitals:   05/03/19 1339  BP: 128/76  Pulse: 94  Temp: (!) 97.1 F  (36.2 C)  SpO2: 97%  Weight: 116 lb (52.6 kg)  Height: '5\' 3"'$  (1.6 m)   Body mass index is 20.55 kg/m. Physical Exam Vitals signs and nursing note reviewed.  Constitutional:      General: She is not in acute distress.    Appearance: Normal appearance. She is not ill-appearing, toxic-appearing or diaphoretic.  HENT:     Head: Normocephalic and atraumatic.     Nose: Nose normal.     Mouth/Throat:     Mouth: Mucous membranes are moist.  Eyes:     Extraocular Movements: Extraocular movements intact.     Conjunctiva/sclera: Conjunctivae normal.     Pupils: Pupils are equal, round, and reactive to light.  Neck:     Musculoskeletal: Normal range of motion and neck supple.  Cardiovascular:     Rate and Rhythm: Normal rate and regular rhythm.     Heart sounds: No murmur.  Pulmonary:     Breath sounds: No wheezing or rales.  Abdominal:     General: Bowel sounds are normal. There is no distension.     Palpations: Abdomen is soft.     Tenderness: There is no abdominal tenderness. There is no right CVA tenderness, left CVA tenderness, guarding or rebound.  Musculoskeletal:     Right lower leg: Edema present.     Left lower leg: Edema present.     Comments: Trace edema BLE  Skin:    General: Skin is warm and dry.  Neurological:     General: No focal deficit present.     Mental Status: She is alert. Mental status is at baseline.     Cranial Nerves: No cranial nerve deficit.     Motor: No weakness.     Coordination: Coordination normal.     Gait: Gait abnormal.     Deep Tendon Reflexes: Reflexes normal.     Comments: Oriented to person, place.   Psychiatric:        Mood and Affect: Mood normal.        Behavior: Behavior normal.        Thought Content: Thought content normal.        Judgment: Judgment normal.     Labs reviewed: Basic Metabolic Panel: Recent Labs    09/04/18 0000  NA 138  K 4.2  CL 104  CO2 29  GLUCOSE 76  BUN 30*  CREATININE 1.09*  CALCIUM 9.9    Liver Function Tests: Recent Labs    09/04/18 0000  AST 21  ALT 13  BILITOT 0.5  PROT 6.6   No results for input(s): LIPASE, AMYLASE in the last 8760 hours. No results for input(s): AMMONIA in the last 8760 hours. CBC: Recent Labs    06/13/18 0715 09/04/18 0000  WBC 5.2 5.0  NEUTROABS  --  2,925  HGB 11.0* 11.3*  HCT 33.5* 34.1*  MCV 97.7 96.1  PLT 215 219   Lipid Panel: No results for input(s): CHOL, HDL, LDLCALC, TRIG, CHOLHDL, LDLDIRECT in the last 8760 hours. No results found for: HGBA1C  Procedures since last visit: No results found.  Assessment/Plan  Incontinent of urine Not new, but worsened,  Kegel exercise helped in the past. Obtain CBC/diff CMP/eGFR, UA C/S 05/08/19, will encourage Kegel exercise, try Myrbetriq 23m qd, f/u in clinic [facility] in 2 weeks.    Hypertension Blood pressure is controlled, continue Valsartan 1632mqd.   Venous insufficiency (chronic) (peripheral) Trace edema, continue Furosemide 2058md.    Labs/tests ordered: CBC/diff, CMP/eGFR, UA C/S  Next appt: 2 weeks

## 2019-05-03 NOTE — Patient Instructions (Signed)
Obtain CBC/diff CMP/eGFR, UA C/S 05/08/19, will encourage Kegel exercise, try Myrbetriq 29m qd, f/u in clinic [facility] in 2 weeks.

## 2019-05-07 DIAGNOSIS — N3942 Incontinence without sensory awareness: Secondary | ICD-10-CM | POA: Diagnosis not present

## 2019-05-07 DIAGNOSIS — I872 Venous insufficiency (chronic) (peripheral): Secondary | ICD-10-CM | POA: Diagnosis not present

## 2019-05-08 ENCOUNTER — Other Ambulatory Visit: Payer: Self-pay

## 2019-05-08 DIAGNOSIS — I872 Venous insufficiency (chronic) (peripheral): Secondary | ICD-10-CM

## 2019-05-08 DIAGNOSIS — N3942 Incontinence without sensory awareness: Secondary | ICD-10-CM

## 2019-05-10 LAB — COMPLETE METABOLIC PANEL WITH GFR
AG Ratio: 1.6 (calc) (ref 1.0–2.5)
ALT: 18 U/L (ref 6–29)
AST: 23 U/L (ref 10–35)
Albumin: 4 g/dL (ref 3.6–5.1)
Alkaline phosphatase (APISO): 59 U/L (ref 37–153)
BUN/Creatinine Ratio: 30 (calc) — ABNORMAL HIGH (ref 6–22)
BUN: 29 mg/dL — ABNORMAL HIGH (ref 7–25)
CO2: 29 mmol/L (ref 20–32)
Calcium: 9.7 mg/dL (ref 8.6–10.4)
Chloride: 105 mmol/L (ref 98–110)
Creat: 0.97 mg/dL — ABNORMAL HIGH (ref 0.60–0.88)
GFR, Est African American: 60 mL/min/{1.73_m2} (ref >=60)
GFR, Est Non African American: 52 mL/min/{1.73_m2} — ABNORMAL LOW (ref >=60)
Globulin: 2.5 g/dL (calc) (ref 1.9–3.7)
Glucose, Bld: 82 mg/dL (ref 65–99)
Potassium: 4 mmol/L (ref 3.5–5.3)
Sodium: 140 mmol/L (ref 135–146)
Total Bilirubin: 0.5 mg/dL (ref 0.2–1.2)
Total Protein: 6.5 g/dL (ref 6.1–8.1)

## 2019-05-10 LAB — CBC WITH DIFFERENTIAL/PLATELET
Absolute Monocytes: 448 cells/uL (ref 200–950)
Basophils Absolute: 78 cells/uL (ref 0–200)
Basophils Relative: 1.4 %
Eosinophils Absolute: 426 cells/uL (ref 15–500)
Eosinophils Relative: 7.6 %
HCT: 34.6 % — ABNORMAL LOW (ref 35.0–45.0)
Hemoglobin: 11.5 g/dL — ABNORMAL LOW (ref 11.7–15.5)
Lymphs Abs: 1742 cells/uL (ref 850–3900)
MCH: 32 pg (ref 27.0–33.0)
MCHC: 33.2 g/dL (ref 32.0–36.0)
MCV: 96.4 fL (ref 80.0–100.0)
MPV: 11 fL (ref 7.5–12.5)
Monocytes Relative: 8 %
Neutro Abs: 2906 cells/uL (ref 1500–7800)
Neutrophils Relative %: 51.9 %
Platelets: 236 10*3/uL (ref 140–400)
RBC: 3.59 10*6/uL — ABNORMAL LOW (ref 3.80–5.10)
RDW: 11.8 % (ref 11.0–15.0)
Total Lymphocyte: 31.1 %
WBC: 5.6 10*3/uL (ref 3.8–10.8)

## 2019-05-10 LAB — EXTRA URINE SPECIMEN

## 2019-05-10 LAB — URINE CULTURE
MICRO NUMBER:: 1134293
SPECIMEN QUALITY:: ADEQUATE

## 2019-05-17 ENCOUNTER — Encounter: Payer: Self-pay | Admitting: Nurse Practitioner

## 2019-05-17 ENCOUNTER — Non-Acute Institutional Stay: Payer: Medicare Other | Admitting: Nurse Practitioner

## 2019-05-17 ENCOUNTER — Other Ambulatory Visit: Payer: Self-pay

## 2019-05-17 DIAGNOSIS — I872 Venous insufficiency (chronic) (peripheral): Secondary | ICD-10-CM

## 2019-05-17 DIAGNOSIS — I10 Essential (primary) hypertension: Secondary | ICD-10-CM

## 2019-05-17 DIAGNOSIS — N3942 Incontinence without sensory awareness: Secondary | ICD-10-CM

## 2019-05-17 NOTE — Patient Instructions (Signed)
Continue Kegel exercise for urinary incontinence, elevated legs through out day, walking with cane for safety. F/u in clinic [facility] in 3 months.

## 2019-05-17 NOTE — Assessment & Plan Note (Signed)
Trace edema BLE, continue TED, Furosemide 20mg  qd

## 2019-05-17 NOTE — Progress Notes (Signed)
Location:   clinic [place]   Place of Service:  ALF (13) Provider: Marlana Latus NP  Code Status: DNR Goals of Care: [place] 05/17/2019  Does Patient Have a Medical Advance Directive? Yes  Type of Advance Directive Out of facility DNR (pink MOST or yellow form)  Does patient want to make changes to medical advance directive? No - Patient declined  Copy of [place] in Chart? -  Pre-existing out of facility DNR order (yellow form or pink MOST form) Pink MOST form placed in chart (order not valid for inpatient use)     Chief Complaint  Patient presents with  . Follow-up    2-week followup. Feels Mybetriq helps, also doing Kegel exercises.   . Immunizations    Needs tetanus immunization    HPI: Patient is a 85 y.o. female seen today for medical management of chronic diseases.    Urinary incontinence, improved on Myrbetriq, Kegel exercise,  urine culture showed 10,000-49,000 C/mo o growth of mixed flora was isolated, suggesting probable contamination 05/07/19. CBC/CMP unremarkable. HTN, blood pressure is controlled, Valsartan 160mg  qd. Edema trace, on Furosemide 20mg  qd.    Past Medical History:  Diagnosis Date  . Hypertension     History reviewed. No pertinent surgical history.  Allergies  Allergen Reactions  . Ace Inhibitors   . Flagyl [Metronidazole]   . Penicillins Swelling    facial    Allergies as of 05/17/2019      Reactions   Ace Inhibitors    Flagyl [metronidazole]    Penicillins Swelling   facial      Medication List       Accurate as of May 17, 2019 11:59 PM. If you have any questions, ask your nurse or doctor.        aspirin EC 81 MG tablet Take 81 mg by mouth daily.   Calcium 500 +D 500-400 MG-UNIT Tabs Generic drug: Calcium Carb-Cholecalciferol Take 1 tablet by mouth daily.   cholecalciferol 25 MCG (1000 UT) tablet Commonly known as: VITAMIN D Take 2,000 Units by mouth daily.   furosemide 20 MG tablet  Commonly known as: LASIX TAKE 1 TABLET BY MOUTH  DAILY   ICAPS AREDS 2 PO Take by mouth 2 (two) times daily.   mirabegron ER 25 MG Tb24 tablet Commonly known as: MYRBETRIQ Take 1 tablet (25 mg total) by mouth daily.   multivitamin with minerals Tabs tablet Take 1 tablet by mouth daily.   polycarbophil 625 MG tablet Commonly known as: FIBERCON Take 625 mg by mouth as needed.   potassium chloride 10 MEQ tablet Commonly known as: KLOR-CON Take 1 tablet (10 mEq total) by mouth daily.   valsartan 160 MG tablet Commonly known as: DIOVAN TAKE 1 TABLET BY MOUTH  DAILY   vitamin C 250 MG tablet Commonly known as: ASCORBIC ACID Take 250 mg by mouth daily.       Review of Systems:  Review of Systems  Constitutional: Negative for activity change, appetite change, chills, diaphoresis, fatigue and fever.  HENT: Positive for hearing loss. Negative for congestion and voice change.   Eyes: Negative for visual disturbance.  Respiratory: Negative for cough, shortness of breath and wheezing.   Cardiovascular: Positive for leg swelling. Negative for chest pain and palpitations.  Gastrointestinal: Negative for abdominal distention, abdominal pain, constipation, diarrhea, nausea and vomiting.  Genitourinary: Negative for difficulty urinating, dysuria, frequency and urgency.       Occasionally incontinent of urine.   Musculoskeletal:  Positive for gait problem.       Cane walking.   Neurological: Negative for dizziness, speech difficulty, weakness and headaches.       Memory lapses.   Psychiatric/Behavioral: Negative for agitation, behavioral problems, hallucinations and sleep disturbance. The patient is not nervous/anxious.     Health Maintenance  Topic Date Due  . TETANUS/TDAP  05/04/1948  . DEXA SCAN  05/04/1996  . INFLUENZA VACCINE  Completed  . PNA vac Low Risk Adult  Completed    Physical Exam: Vitals:   05/17/19 1340  BP: 122/72  Pulse: 87  Temp: 98 F (36.7 C)   TempSrc: Temporal  SpO2: 95%  Weight: 120 lb 12.8 oz (54.8 kg)  Height: 5\' 3"  (1.6 m)   Body mass index is 21.4 kg/m. Physical Exam Vitals signs reviewed.  Constitutional:      General: She is not in acute distress.    Appearance: Normal appearance. She is not ill-appearing, toxic-appearing or diaphoretic.  HENT:     Head: Normocephalic and atraumatic.     Nose: Nose normal.     Mouth/Throat:     Mouth: Mucous membranes are moist.  Eyes:     Extraocular Movements: Extraocular movements intact.     Conjunctiva/sclera: Conjunctivae normal.     Pupils: Pupils are equal, round, and reactive to light.  Neck:     Musculoskeletal: Normal range of motion and neck supple.  Cardiovascular:     Rate and Rhythm: Normal rate and regular rhythm.     Heart sounds: No murmur.  Pulmonary:     Breath sounds: No wheezing, rhonchi or rales.  Abdominal:     General: Bowel sounds are normal. There is no distension.     Palpations: Abdomen is soft.     Tenderness: There is no abdominal tenderness. There is no right CVA tenderness, left CVA tenderness, guarding or rebound.  Musculoskeletal:     Right lower leg: Edema present.     Left lower leg: Edema present.     Comments: Trace edema BLE. Cane walking.   Skin:    General: Skin is warm and dry.  Neurological:     General: No focal deficit present.     Mental Status: She is alert. Mental status is at baseline.     Motor: No weakness.     Coordination: Coordination normal.     Gait: Gait abnormal.     Comments: Oriented to person, place.   Psychiatric:        Mood and Affect: Mood normal.        Behavior: Behavior normal.        Thought Content: Thought content normal.        Judgment: Judgment normal.     Labs reviewed: Basic Metabolic Panel: Recent Labs    09/04/18 0000 05/07/19 1451  NA 138 140  K 4.2 4.0  CL 104 105  CO2 29 29  GLUCOSE 76 82  BUN 30* 29*  CREATININE 1.09* 0.97*  CALCIUM 9.9 9.7   Liver Function Tests:  Recent Labs    09/04/18 0000 05/07/19 1451  AST 21 23  ALT 13 18  BILITOT 0.5 0.5  PROT 6.6 6.5   No results for input(s): LIPASE, AMYLASE in the last 8760 hours. No results for input(s): AMMONIA in the last 8760 hours. CBC: Recent Labs    06/13/18 0715 09/04/18 0000 05/07/19 1451  WBC 5.2 5.0 5.6  NEUTROABS  --  2,925 2,906  HGB 11.0* 11.3* 11.5*  HCT 33.5* 34.1* 34.6*  MCV 97.7 96.1 96.4  PLT 215 219 236   Lipid Panel: No results for input(s): CHOL, HDL, LDLCALC, TRIG, CHOLHDL, LDLDIRECT in the last 8760 hours. No results found for: HGBA1C  Procedures since last visit: No results found.  Assessment/Plan  Venous insufficiency (chronic) (peripheral) Trace edema BLE, continue TED, Furosemide 20mg  qd  Hypertension Blood pressure is controlled, continue Valsartan 160mg  qd  Incontinent of urine Improving, nocturnal urination x1/night, continue Myrbetriq, Kegel exercise.    Labs/tests ordered:  None  Next appt:  3 months.

## 2019-05-17 NOTE — Assessment & Plan Note (Signed)
Improving, nocturnal urination x1/night, continue Myrbetriq, Kegel exercise.

## 2019-05-17 NOTE — Assessment & Plan Note (Signed)
Blood pressure is controlled, continue Valsartan 160mg  qd

## 2019-05-18 ENCOUNTER — Encounter: Payer: Self-pay | Admitting: Nurse Practitioner

## 2019-06-17 ENCOUNTER — Other Ambulatory Visit: Payer: Self-pay | Admitting: Nurse Practitioner

## 2019-06-22 ENCOUNTER — Other Ambulatory Visit: Payer: Self-pay | Admitting: Nurse Practitioner

## 2019-07-05 ENCOUNTER — Other Ambulatory Visit: Payer: Self-pay

## 2019-07-05 DIAGNOSIS — I872 Venous insufficiency (chronic) (peripheral): Secondary | ICD-10-CM

## 2019-07-10 ENCOUNTER — Other Ambulatory Visit: Payer: Self-pay | Admitting: Nurse Practitioner

## 2019-07-10 DIAGNOSIS — I10 Essential (primary) hypertension: Secondary | ICD-10-CM

## 2019-07-10 LAB — CBC WITH DIFFERENTIAL/PLATELET
Absolute Monocytes: 459 cells/uL (ref 200–950)
Basophils Absolute: 70 cells/uL (ref 0–200)
Basophils Relative: 1.3 %
Eosinophils Absolute: 432 cells/uL (ref 15–500)
Eosinophils Relative: 8 %
HCT: 32.9 % — ABNORMAL LOW (ref 35.0–45.0)
Hemoglobin: 10.7 g/dL — ABNORMAL LOW (ref 11.7–15.5)
Lymphs Abs: 1696 cells/uL (ref 850–3900)
MCH: 31.5 pg (ref 27.0–33.0)
MCHC: 32.5 g/dL (ref 32.0–36.0)
MCV: 96.8 fL (ref 80.0–100.0)
MPV: 10.7 fL (ref 7.5–12.5)
Monocytes Relative: 8.5 %
Neutro Abs: 2743 cells/uL (ref 1500–7800)
Neutrophils Relative %: 50.8 %
Platelets: 211 10*3/uL (ref 140–400)
RBC: 3.4 10*6/uL — ABNORMAL LOW (ref 3.80–5.10)
RDW: 12 % (ref 11.0–15.0)
Total Lymphocyte: 31.4 %
WBC: 5.4 10*3/uL (ref 3.8–10.8)

## 2019-07-11 NOTE — Telephone Encounter (Signed)
High risk or very high risk warning populated when attempting to refill medication. RX request sent to PCP for review and approval if warranted.   

## 2019-07-12 ENCOUNTER — Other Ambulatory Visit: Payer: Self-pay

## 2019-07-12 ENCOUNTER — Encounter: Payer: Medicare Other | Admitting: Nurse Practitioner

## 2019-08-16 ENCOUNTER — Encounter: Payer: Self-pay | Admitting: Nurse Practitioner

## 2019-08-16 ENCOUNTER — Other Ambulatory Visit: Payer: Self-pay

## 2019-08-16 ENCOUNTER — Non-Acute Institutional Stay: Payer: Medicare Other | Admitting: Nurse Practitioner

## 2019-08-16 DIAGNOSIS — I10 Essential (primary) hypertension: Secondary | ICD-10-CM | POA: Diagnosis not present

## 2019-08-16 DIAGNOSIS — N3942 Incontinence without sensory awareness: Secondary | ICD-10-CM | POA: Diagnosis not present

## 2019-08-16 DIAGNOSIS — I872 Venous insufficiency (chronic) (peripheral): Secondary | ICD-10-CM | POA: Diagnosis not present

## 2019-08-16 NOTE — Assessment & Plan Note (Signed)
Blood pressure is controlled, continue Valsartan.

## 2019-08-16 NOTE — Assessment & Plan Note (Addendum)
Traced edema BLE, 11/22/17 US venous BLE negative for DVT, continue Furosemide.

## 2019-08-16 NOTE — Progress Notes (Signed)
Location:   clinic [place]   Place of Service:  Clinic (12) Provider: Marlana Latus NP  Code Status: DNR Goals of Care: [place] 05[place]17/2019  Does Patient Have a Medical Advance Directive? Yes  Type of Advance Directive Out of facility DNR (pink MOST or yellow form)  Does patient want to make changes to medical advance directive? No - Patient declined  Copy of [place] in Chart? -  Pre-existing out of facility DNR order (yellow form or pink MOST form) Pink MOST form placed in chart (order not valid for inpatient use)     Chief Complaint  Patient presents with  . Medical Management of Chronic Issues    3 month follow up. Patient would like to discuss her leaking urine issue.  Marland Kitchen Health Maintenance    TDAP, dexa scan    HPI: Patient is a 86 y.o. female seen today for medical management of chronic diseases.    Hx of peripheral edema, better, trace, on Furosemide 20mg  qd. Her urinary frequency/leakage, uses pads, remains no change, on Myrbetriq. HTN, blood pressure is controlled, on Valsartan 160mg  qd. No constipation, taking Fibercon prn.    Past Medical History:  Diagnosis Date  . Hypertension     History reviewed. No pertinent surgical history.  Allergies  Allergen Reactions  . Ace Inhibitors   . Flagyl [Metronidazole]   . Penicillins Swelling    facial    Allergies as of 08/16/2019      Reactions   Ace Inhibitors    Flagyl [metronidazole]    Penicillins Swelling   facial      Medication List       Accurate as of August 16, 2019 11:59 PM. If you have any questions, ask your nurse or doctor.        STOP taking these medications   potassium chloride 10 MEQ tablet Commonly known as: KLOR-CON Stopped by: Felicie Kocher X Azucena Dart, NP     TAKE these medications   aspirin EC 81 MG tablet Take 81 mg by mouth daily.   Calcium 500 +D 500-400 MG-UNIT Tabs Generic drug: Calcium Carb-Cholecalciferol Take 1 tablet by mouth daily.   cholecalciferol  25 MCG (1000 UNIT) tablet Commonly known as: VITAMIN D Take 2,000 Units by mouth daily.   furosemide 20 MG tablet Commonly known as: LASIX TAKE 1 TABLET BY MOUTH  DAILY   ICAPS AREDS 2 PO Take by mouth 2 (two) times daily.   multivitamin with minerals Tabs tablet Take 1 tablet by mouth daily.   Myrbetriq 25 MG Tb24 tablet Generic drug: mirabegron ER TAKE 1 TABLET BY MOUTH EVERY DAY   polycarbophil 625 MG tablet Commonly known as: FIBERCON Take 625 mg by mouth as needed.   potassium chloride 10 MEQ tablet Commonly known as: KLOR-CON Take 1 tablet (10 mEq total) by mouth daily.   valsartan 160 MG tablet Commonly known as: DIOVAN TAKE 1 TABLET BY MOUTH  DAILY   vitamin C 250 MG tablet Commonly known as: ASCORBIC ACID Take 250 mg by mouth daily.       Review of Systems:  Review of Systems  Constitutional: Negative for activity change, appetite change, chills, diaphoresis, fatigue and fever.  HENT: Positive for hearing loss. Negative for congestion and voice change.   Eyes: Negative for visual disturbance.  Respiratory: Negative for cough, shortness of breath and wheezing.   Cardiovascular: Positive for leg swelling. Negative for chest pain and palpitations.  Gastrointestinal: Negative for abdominal distention, abdominal  pain, constipation, nausea and vomiting.  Genitourinary: Positive for frequency. Negative for difficulty urinating, dysuria and urgency.       Occasionally incontinent of urine. Urination average every 4 hrs during day 1x/night.   Musculoskeletal: Positive for gait problem.       Cane walking.   Skin: Negative for color change and pallor.  Neurological: Negative for dizziness, speech difficulty, weakness and headaches.       Memory lapses.   Psychiatric/Behavioral: Negative for agitation, behavioral problems, hallucinations and sleep disturbance. The patient is not nervous/anxious.     Health Maintenance  Topic Date Due  . TETANUS/TDAP  05/04/1948    . DEXA SCAN  05/04/1996  . INFLUENZA VACCINE  Completed  . PNA vac Low Risk Adult  Completed    Physical Exam: Vitals:   08/16/19 1305  BP: 136/80  Pulse: 92  Temp: 98.2 F (36.8 C)  SpO2: 95%  Weight: 121 lb (54.9 kg)  Height: 5\' 3"  (1.6 m)   Body mass index is 21.43 kg/m. Physical Exam Vitals reviewed.  Constitutional:      General: She is not in acute distress.    Appearance: Normal appearance. She is not ill-appearing.  HENT:     Head: Normocephalic and atraumatic.     Nose: Nose normal.     Mouth/Throat:     Mouth: Mucous membranes are moist.  Eyes:     Extraocular Movements: Extraocular movements intact.     Conjunctiva/sclera: Conjunctivae normal.     Pupils: Pupils are equal, round, and reactive to light.  Cardiovascular:     Rate and Rhythm: Normal rate and regular rhythm.     Heart sounds: No murmur.  Pulmonary:     Breath sounds: No wheezing or rales.  Abdominal:     General: Bowel sounds are normal. There is no distension.     Palpations: Abdomen is soft.     Tenderness: There is no abdominal tenderness. There is no guarding or rebound.  Musculoskeletal:     Cervical back: Normal range of motion and neck supple.     Right lower leg: Edema present.     Left lower leg: Edema present.     Comments: Trace edema BLE. Cane walking.   Skin:    General: Skin is warm and dry.  Neurological:     General: No focal deficit present.     Mental Status: She is alert. Mental status is at baseline.     Motor: No weakness.     Coordination: Coordination normal.     Gait: Gait abnormal.     Comments: Oriented to person, place.   Psychiatric:        Mood and Affect: Mood normal.        Behavior: Behavior normal.        Thought Content: Thought content normal.        Judgment: Judgment normal.     Labs reviewed: Basic Metabolic Panel: Recent Labs    09/04/18 0000 05/07/19 1451  NA 138 140  K 4.2 4.0  CL 104 105  CO2 29 29  GLUCOSE 76 82  BUN 30* 29*   CREATININE 1.09* 0.97*  CALCIUM 9.9 9.7   Liver Function Tests: Recent Labs    09/04/18 0000 05/07/19 1451  AST 21 23  ALT 13 18  BILITOT 0.5 0.5  PROT 6.6 6.5   No results for input(s): LIPASE, AMYLASE in the last 8760 hours. No results for input(s): AMMONIA in the last 8760  hours. CBC: Recent Labs    09/04/18 0000 05/07/19 1451 07/04/19 0912  WBC 5.0 5.6 5.4  NEUTROABS 2,925 2,906 2,743  HGB 11.3* 11.5* 10.7*  HCT 34.1* 34.6* 32.9*  MCV 96.1 96.4 96.8  PLT 219 236 211   Lipid Panel: No results for input(s): CHOL, HDL, LDLCALC, TRIG, CHOLHDL, LDLDIRECT in the last 8760 hours. No results found for: HGBA1C  Procedures since last visit: No results found.  Assessment/Plan  Hypertension Blood pressure is controlled, continue Valsartan.   Venous insufficiency (chronic) (peripheral) Traced edema BLE, 11/22/17 US venous BLE negative for DVT, continue Furosemide.   Incontinent of urine No change, average urination q4hr during day, x1/night, continue pads, doing Kegel exercise, continue  Myrbetriq   Labs/tests ordered:  None  Next appt:  See Dr Lyndel Safe in 3 months.

## 2019-08-16 NOTE — Patient Instructions (Addendum)
Doing well, continue to exercise, walling with cane, fall precaution. See Dr Lyndel Safe in 3 months.

## 2019-08-16 NOTE — Assessment & Plan Note (Addendum)
No change, average urination q4hr during day, x1/night, continue pads, doing Kegel exercise, continue  Myrbetriq

## 2019-08-17 ENCOUNTER — Encounter: Payer: Self-pay | Admitting: Nurse Practitioner

## 2019-08-17 ENCOUNTER — Other Ambulatory Visit: Payer: Self-pay | Admitting: Internal Medicine

## 2019-10-23 ENCOUNTER — Other Ambulatory Visit: Payer: Self-pay | Admitting: Nurse Practitioner

## 2019-11-23 ENCOUNTER — Non-Acute Institutional Stay: Payer: Medicare Other | Admitting: Internal Medicine

## 2019-11-23 ENCOUNTER — Other Ambulatory Visit: Payer: Self-pay

## 2019-11-23 ENCOUNTER — Encounter: Payer: Self-pay | Admitting: Internal Medicine

## 2019-11-23 VITALS — BP 120/86 | HR 82 | Temp 96.6°F | Ht 63.0 in | Wt 112.4 lb

## 2019-11-23 DIAGNOSIS — M8580 Other specified disorders of bone density and structure, unspecified site: Secondary | ICD-10-CM

## 2019-11-23 DIAGNOSIS — N3942 Incontinence without sensory awareness: Secondary | ICD-10-CM | POA: Diagnosis not present

## 2019-11-23 DIAGNOSIS — N1831 Chronic kidney disease, stage 3a: Secondary | ICD-10-CM

## 2019-11-23 DIAGNOSIS — I10 Essential (primary) hypertension: Secondary | ICD-10-CM | POA: Diagnosis not present

## 2019-11-23 DIAGNOSIS — D649 Anemia, unspecified: Secondary | ICD-10-CM

## 2019-11-23 DIAGNOSIS — I872 Venous insufficiency (chronic) (peripheral): Secondary | ICD-10-CM | POA: Diagnosis not present

## 2019-11-23 MED ORDER — DTAP-HEPATITIS B RECOMB-IPV IM SUSP: 0.5000 mL | Freq: Once | INTRAMUSCULAR | 0 refills | Status: AC

## 2019-11-23 NOTE — Patient Instructions (Signed)
Labs next week Thursday  You will see Manxie 4 months mast   Have TDAP vaccine given by pharmacy.

## 2019-11-23 NOTE — Progress Notes (Signed)
Location:  [place] of Service:  Clinic (12)  Provider:   Code Status:  Goals of Care:  Advanced Directives 05/17/2019  Does Patient Have a Medical Advance Directive? Yes  Type of Advance Directive Out of facility DNR (pink MOST or yellow form)  Does patient want to make changes to medical advance directive? No - Patient declined  Copy of [place] in Chart? -  Pre-existing out of facility DNR order (yellow form or pink MOST form) Pink MOST form placed in chart (order not valid for inpatient use)     Chief Complaint  Patient presents with  . Medical Management of Chronic Issues    Patient returns to the clinic for her 3 month follow up.   Marland Kitchen Health Maintenance    TDAP, Dexa scan    HPI: Patient is a 86 y.o. female seen today for medical management of chronic diseases.    Patient has history of Urinary incontinence It seems to be her primary problem she states that it really affects her day-to-day activities.  She has to wear a pad all the time.  She she is on medical fatigue and wants to know if that something else can be done.   Hypertension  seems to be controlled  Lower extremity edema On low-dose of Lasix Allergic conjunctivitis Is taking eyedrops per her ophthalmologist. Valentino Saxon to wear her glasses when she goes out.  Patient is active otherwise does not drive anymore.  Has a daughter who lives close by. Also had depression which she goes out with for walking on the trails She has not had any falls . walk with a cane.    Past Medical History:  Diagnosis Date  . Hypertension     History reviewed. No pertinent surgical history.  Allergies  Allergen Reactions  . Ace Inhibitors   . Flagyl [Metronidazole]   . Penicillins Swelling    facial    Outpatient Encounter Medications as of 11/23/2019  Medication Sig  . aspirin EC 81 MG tablet Take 81 mg by mouth daily.  . Calcium Carb-Cholecalciferol (CALCIUM 500 +D) 500-400  MG-UNIT TABS Take 1 tablet by mouth daily.  . cholecalciferol (VITAMIN D) 1000 units tablet Take 2,000 Units by mouth daily.  . DTaP-hepatitis B recombinant-IPV (PEDIARIX) injection Inject 0.5 mLs into the muscle once.  . furosemide (LASIX) 20 MG tablet TAKE 1 TABLET BY MOUTH  DAILY  . Multiple Vitamin (MULTIVITAMIN WITH MINERALS) TABS tablet Take 1 tablet by mouth daily.  . Multiple Vitamins-Minerals (ICAPS AREDS 2 PO) Take by mouth 2 (two) times daily.  Marland Kitchen MYRBETRIQ 25 MG TB24 tablet TAKE 1 TABLET BY MOUTH EVERY DAY  . polycarbophil (FIBERCON) 625 MG tablet Take 625 mg by mouth as needed.   . potassium chloride (K-DUR) 10 MEQ tablet Take 1 tablet (10 mEq total) by mouth daily.  . valsartan (DIOVAN) 160 MG tablet TAKE 1 TABLET BY MOUTH  DAILY  . vitamin C (ASCORBIC ACID) 250 MG tablet Take 250 mg by mouth daily.   No facility-administered encounter medications on file as of 11/23/2019.    Review of Systems:  Review of Systems  Review of Systems  Constitutional: Negative for activity change, appetite change, chills, diaphoresis, fatigue and fever.  HENT: Negative for mouth sores, postnasal drip, rhinorrhea, sinus pain and sore throat.   Respiratory: Negative for apnea, cough, chest tightness, shortness of breath and wheezing.   Cardiovascular: Negative for chest pain, palpitations and leg swelling.  Gastrointestinal: Negative for abdominal distention, abdominal pain, constipation, diarrhea, nausea and vomiting.  Genitourinary: Negative for dysuria   Musculoskeletal: Negative for arthralgias, joint swelling and myalgias.  Skin: Negative for rash.  Neurological: Negative for dizziness, syncope, weakness, light-headedness and numbness.  Psychiatric/Behavioral: Negative for behavioral problems, confusion and sleep disturbance.     Health Maintenance  Topic Date Due  . TETANUS/TDAP  Never done  . DEXA SCAN  Never done  . INFLUENZA VACCINE  01/13/2020  . COVID-19 Vaccine  Completed  .  PNA vac Low Risk Adult  Completed    Physical Exam: Vitals:   11/23/19 1104  BP: 120/86  Pulse: 82  Temp: (!) 96.6 F (35.9 C)  SpO2: 95%  Weight: 112 lb 6.4 oz (51 kg)  Height: 5\' 3"  (1.6 m)   Body mass index is 19.91 kg/m. Physical Exam  Constitutional: Oriented to person, place, and time. Well-developed and well-nourished.  Has Kyphosis HENT:  Head: Normocephalic.  Mouth/Throat: Oropharynx is clear and moist.  Eyes: Pupils are equal, round, and reactive to light.  Ears had mild wax Bilateral Neck: Neck supple.  Cardiovascular: Normal rate and normal heart sounds.  No murmur heard. Pulmonary/Chest: Effort normal and breath sounds normal. No respiratory distress. No wheezes. She has no rales.  Abdominal: Soft. Bowel sounds are normal. No distension. There is no tenderness. There is no rebound.  Musculoskeletal: Mild Edema Bilateral Lymphadenopathy: none Neurological: Alert and oriented to person, place, and time.  No Focal Deficits Skin: Skin is warm and dry.  Psychiatric: Normal mood and affect. Behavior is normal. Thought content normal.    Labs reviewed: Basic Metabolic Panel: Recent Labs    05/07/19 1451  NA 140  K 4.0  CL 105  CO2 29  GLUCOSE 82  BUN 29*  CREATININE 0.97*  CALCIUM 9.7   Liver Function Tests: Recent Labs    05/07/19 1451  AST 23  ALT 18  BILITOT 0.5  PROT 6.5   No results for input(s): LIPASE, AMYLASE in the last 8760 hours. No results for input(s): AMMONIA in the last 8760 hours. CBC: Recent Labs    05/07/19 1451 07/04/19 0912  WBC 5.6 5.4  NEUTROABS 2,906 2,743  HGB 11.5* 10.7*  HCT 34.6* 32.9*  MCV 96.4 96.8  PLT 236 211   Lipid Panel: No results for input(s): CHOL, HDL, LDLCALC, TRIG, CHOLHDL, LDLDIRECT in the last 8760 hours. No results found for: HGBA1C  Procedures since last visit: No results found.  Assessment/Plan Urinary incontinence without sensory awareness - Plan: Ambulatory referral to  Urology Discussed with the patient about going up on Myrbetriq but at this time she wanted to see a urologist.  Osteopenia, unspecified location - Plan: [facility] DEXA SCAN  Essential hypertension - Plan: COMPLETE METABOLIC PANEL WITH GFR, TSH Blood pressure controlled on Diovan  Venous insufficiency (chronic) (peripheral) Continue Lasix Recheck CMP Anemia, unspecified type - Plan: CBC with Differential/Platelet, Iron and TIBC, Ferritin  Stage 3a chronic kidney disease - Plan: Vitamin D, 25-hydroxy     Labs/tests ordered:  * No order type specified * Next appt:  11/29/2019

## 2019-11-26 ENCOUNTER — Other Ambulatory Visit: Payer: Self-pay | Admitting: Nurse Practitioner

## 2019-11-29 ENCOUNTER — Other Ambulatory Visit: Payer: Self-pay

## 2019-11-29 DIAGNOSIS — N1831 Chronic kidney disease, stage 3a: Secondary | ICD-10-CM

## 2019-11-29 DIAGNOSIS — D649 Anemia, unspecified: Secondary | ICD-10-CM

## 2019-11-29 DIAGNOSIS — I10 Essential (primary) hypertension: Secondary | ICD-10-CM

## 2019-11-30 LAB — COMPLETE METABOLIC PANEL WITH GFR
AG Ratio: 1.8 (calc) (ref 1.0–2.5)
ALT: 20 U/L (ref 6–29)
AST: 24 U/L (ref 10–35)
Albumin: 4.3 g/dL (ref 3.6–5.1)
Alkaline phosphatase (APISO): 64 U/L (ref 37–153)
BUN/Creatinine Ratio: 30 (calc) — ABNORMAL HIGH (ref 6–22)
BUN: 30 mg/dL — ABNORMAL HIGH (ref 7–25)
CO2: 32 mmol/L (ref 20–32)
Calcium: 10.2 mg/dL (ref 8.6–10.4)
Chloride: 102 mmol/L (ref 98–110)
Creat: 1 mg/dL — ABNORMAL HIGH (ref 0.60–0.88)
GFR, Est African American: 58 mL/min/{1.73_m2} — ABNORMAL LOW (ref >=60)
GFR, Est Non African American: 50 mL/min/{1.73_m2} — ABNORMAL LOW (ref >=60)
Globulin: 2.4 g/dL (calc) (ref 1.9–3.7)
Glucose, Bld: 80 mg/dL (ref 65–99)
Potassium: 4.4 mmol/L (ref 3.5–5.3)
Sodium: 139 mmol/L (ref 135–146)
Total Bilirubin: 0.6 mg/dL (ref 0.2–1.2)
Total Protein: 6.7 g/dL (ref 6.1–8.1)

## 2019-11-30 LAB — CBC WITH DIFFERENTIAL/PLATELET
Absolute Monocytes: 552 cells/uL (ref 200–950)
Basophils Absolute: 60 cells/uL (ref 0–200)
Basophils Relative: 1 %
Eosinophils Absolute: 444 cells/uL (ref 15–500)
Eosinophils Relative: 7.4 %
HCT: 35.1 % (ref 35.0–45.0)
Hemoglobin: 11.6 g/dL — ABNORMAL LOW (ref 11.7–15.5)
Lymphs Abs: 1668 cells/uL (ref 850–3900)
MCH: 31.9 pg (ref 27.0–33.0)
MCHC: 33 g/dL (ref 32.0–36.0)
MCV: 96.4 fL (ref 80.0–100.0)
MPV: 10.7 fL (ref 7.5–12.5)
Monocytes Relative: 9.2 %
Neutro Abs: 3276 cells/uL (ref 1500–7800)
Neutrophils Relative %: 54.6 %
Platelets: 213 10*3/uL (ref 140–400)
RBC: 3.64 10*6/uL — ABNORMAL LOW (ref 3.80–5.10)
RDW: 12.2 % (ref 11.0–15.0)
Total Lymphocyte: 27.8 %
WBC: 6 10*3/uL (ref 3.8–10.8)

## 2019-11-30 LAB — TSH: TSH: 1.99 mIU/L (ref 0.40–4.50)

## 2019-11-30 LAB — IRON, TOTAL/TOTAL IRON BINDING CAP
%SAT: 23 % (calc) (ref 16–45)
Iron: 66 ug/dL (ref 45–160)
TIBC: 288 mcg/dL (calc) (ref 250–450)

## 2019-11-30 LAB — VITAMIN D 25 HYDROXY (VIT D DEFICIENCY, FRACTURES): Vit D, 25-Hydroxy: 53 ng/mL (ref 30–100)

## 2019-11-30 LAB — FERRITIN: Ferritin: 54 ng/mL (ref 16–288)

## 2019-12-19 ENCOUNTER — Other Ambulatory Visit: Payer: Self-pay | Admitting: Nurse Practitioner

## 2019-12-19 DIAGNOSIS — I10 Essential (primary) hypertension: Secondary | ICD-10-CM

## 2019-12-20 NOTE — Telephone Encounter (Signed)
Received Rx from pharmacy.  Pended and sent to [facility] for approval due to [place].

## 2019-12-26 ENCOUNTER — Other Ambulatory Visit: Payer: Self-pay | Admitting: Nurse Practitioner

## 2020-02-21 ENCOUNTER — Non-Acute Institutional Stay: Payer: Medicare Other | Admitting: Nurse Practitioner

## 2020-02-21 ENCOUNTER — Other Ambulatory Visit: Payer: Self-pay

## 2020-02-21 ENCOUNTER — Encounter: Payer: Self-pay | Admitting: Nurse Practitioner

## 2020-02-21 DIAGNOSIS — D649 Anemia, unspecified: Secondary | ICD-10-CM

## 2020-02-21 DIAGNOSIS — I10 Essential (primary) hypertension: Secondary | ICD-10-CM | POA: Diagnosis not present

## 2020-02-21 DIAGNOSIS — N1831 Chronic kidney disease, stage 3a: Secondary | ICD-10-CM | POA: Diagnosis not present

## 2020-02-21 DIAGNOSIS — K5901 Slow transit constipation: Secondary | ICD-10-CM

## 2020-02-21 DIAGNOSIS — R238 Other skin changes: Secondary | ICD-10-CM | POA: Insufficient documentation

## 2020-02-21 DIAGNOSIS — I872 Venous insufficiency (chronic) (peripheral): Secondary | ICD-10-CM

## 2020-02-21 DIAGNOSIS — N3942 Incontinence without sensory awareness: Secondary | ICD-10-CM

## 2020-02-21 DIAGNOSIS — S00521A Blister (nonthermal) of lip, initial encounter: Secondary | ICD-10-CM

## 2020-02-21 MED ORDER — DOCOSANOL 10 % EX CREA: TOPICAL_CREAM | CUTANEOUS | 2 refills | Status: DC

## 2020-02-21 NOTE — Progress Notes (Signed)
Location:   clinic [place]   Place of Service:  Clinic (12) Provider: Marlana Latus NP  Code Status: DNR Goals of Care: [place] 02/21/2020  Does Patient Have a Medical Advance Directive? Yes  Type of Advance Directive Out of facility DNR (pink MOST or yellow form);Healthcare Power of Attorney  Does patient want to make changes to medical advance directive? No - Patient declined  Copy of [place] in Chart? Yes - validated most recent copy scanned in chart (See row information)  Pre-existing out of facility DNR order (yellow form or pink MOST form) Pink MOST form placed in chart (order not valid for inpatient use);Yellow form placed in chart (order not valid for inpatient use)     Chief Complaint  Patient presents with  . Medical Management of Chronic Issues    Patient returns to the clinic for her 3 month follow up.   Marland Kitchen Health Maintenance    Dexa scan     HPI: Patient is a 86 y.o. female seen today for medical management of chronic diseases.    Peripheral edema, no apparent, on Furosemide 60m qd.   Her urinary frequency/leakage, uses pads, it seems better since increased Myrbetriq per Urology  HTN, blood pressure is controlled, on Valsartan 1682mqd.   No constipation, taking Fibercon prn.   CKD, Bun/creat 30/1.0, eGFR 50  Anemia, Hgb 11.6, Iron 66 11/29/19   Past Medical History:  Diagnosis Date  . Hypertension     History reviewed. No pertinent surgical history.  Allergies  Allergen Reactions  . Ace Inhibitors   . Flagyl [Metronidazole]   . Penicillins Swelling    facial    Allergies as of 02/21/2020      Reactions   Ace Inhibitors    Flagyl [metronidazole]    Penicillins Swelling   facial      Medication List       Accurate as of February 21, 2020  7:51 PM. If you have any questions, ask your nurse or doctor.        aspirin EC 81 MG tablet Take 81 mg by mouth daily.   Calcium 500 +D 500-400 MG-UNIT Tabs Generic drug:  Calcium Carb-Cholecalciferol Take 1 tablet by mouth daily.   cholecalciferol 25 MCG (1000 UNIT) tablet Commonly known as: VITAMIN D Take 2,000 Units by mouth daily.   Docosanol 10 % Crea Commonly known as: Abreva Apply to the lower leg blisters 5x/day x 10days Started by: Everline Mahaffy X Jaylanie Boschee, NP   furosemide 20 MG tablet Commonly known as: LASIX TAKE 1 TABLET BY MOUTH  DAILY   ICAPS AREDS 2 PO Take by mouth 2 (two) times daily.   multivitamin with minerals Tabs tablet Take 1 tablet by mouth daily.   Myrbetriq 25 MG Tb24 tablet Generic drug: mirabegron ER TAKE 1 TABLET BY MOUTH EVERY DAY   polycarbophil 625 MG tablet Commonly known as: FIBERCON Take 625 mg by mouth as needed.   potassium chloride 10 MEQ tablet Commonly known as: KLOR-CON Take 1 tablet (10 mEq total) by mouth daily.   potassium chloride 10 MEQ tablet Commonly known as: KLOR-CON TAKE 1 TABLET BY MOUTH  DAILY   valsartan 160 MG tablet Commonly known as: DIOVAN TAKE 1 TABLET BY MOUTH  DAILY   vitamin C 250 MG tablet Commonly known as: ASCORBIC ACID Take 250 mg by mouth daily.       Review of Systems:  Review of Systems  Constitutional: Negative for fatigue, fever  and unexpected weight change.  HENT: Positive for hearing loss. Negative for congestion and voice change.   Eyes: Negative for visual disturbance.  Respiratory: Negative for cough and shortness of breath.   Cardiovascular: Negative for leg swelling.  Gastrointestinal: Negative for abdominal pain and constipation.  Genitourinary: Positive for frequency. Negative for difficulty urinating, dysuria and urgency.       Occasionally incontinent of urine. Urination average every 4 hrs during day 1x/night.   Musculoskeletal: Positive for gait problem.       Cane walking.   Skin: Negative for color change.       Lower lip blisters 2  Neurological: Negative for speech difficulty, weakness, light-headedness and headaches.       Memory lapses.     Psychiatric/Behavioral: Negative for behavioral problems and sleep disturbance. The patient is not nervous/anxious.     Health Maintenance  Topic Date Due  . DEXA SCAN  Never done  . INFLUENZA VACCINE  01/13/2020  . TETANUS/TDAP  11/26/2029  . COVID-19 Vaccine  Completed  . PNA vac Low Risk Adult  Completed    Physical Exam: Vitals:   02/21/20 1415  BP: 118/80  Pulse: 84  Temp: 97.7 F (36.5 C)  SpO2: 96%  Weight: 119 lb 6.4 oz (54.2 kg)  Height: '5\' 3"'  (1.6 m)   Body mass index is 21.15 kg/m. Physical Exam Vitals reviewed.  Constitutional:      Appearance: Normal appearance. She is normal weight.  HENT:     Head: Normocephalic and atraumatic.     Mouth/Throat:     Mouth: Mucous membranes are moist.  Eyes:     Extraocular Movements: Extraocular movements intact.     Conjunctiva/sclera: Conjunctivae normal.     Pupils: Pupils are equal, round, and reactive to light.  Cardiovascular:     Rate and Rhythm: Normal rate and regular rhythm.     Heart sounds: No murmur heard.   Pulmonary:     Breath sounds: No rales.  Abdominal:     General: Bowel sounds are normal.     Palpations: Abdomen is soft.     Tenderness: There is no abdominal tenderness.  Musculoskeletal:     Cervical back: Normal range of motion and neck supple.     Right lower leg: No edema.     Left lower leg: No edema.     Comments: Cane walking.   Skin:    General: Skin is warm and dry.     Comments: 2 small blisters lower lip about 2 weeks, sore, no ulceration, pricked with a needle tip, removed small amount of serous drainage, no redness, swelling, warmth noted.   Neurological:     Mental Status: She is alert. Mental status is at baseline.     Gait: Gait abnormal.     Comments: Oriented to person, place.   Psychiatric:        Mood and Affect: Mood normal.        Behavior: Behavior normal.        Thought Content: Thought content normal.        Judgment: Judgment normal.     Labs  reviewed: Basic Metabolic Panel: Recent Labs    05/07/19 1451 11/29/19 0700  NA 140 139  K 4.0 4.4  CL 105 102  CO2 29 32  GLUCOSE 82 80  BUN 29* 30*  CREATININE 0.97* 1.00*  CALCIUM 9.7 10.2  TSH  --  1.99   Liver Function Tests: Recent Labs  05/07/19 1451 11/29/19 0700  AST 23 24  ALT 18 20  BILITOT 0.5 0.6  PROT 6.5 6.7   No results for input(s): LIPASE, AMYLASE in the last 8760 hours. No results for input(s): AMMONIA in the last 8760 hours. CBC: Recent Labs    05/07/19 1451 07/04/19 0912 11/29/19 0700  WBC 5.6 5.4 6.0  NEUTROABS 2,906 2,743 3,276  HGB 11.5* 10.7* 11.6*  HCT 34.6* 32.9* 35.1  MCV 96.4 96.8 96.4  PLT 236 211 213   Lipid Panel: No results for input(s): CHOL, HDL, LDLCALC, TRIG, CHOLHDL, LDLDIRECT in the last 8760 hours. No results found for: HGBA1C  Procedures since last visit: No results found.  Assessment/Plan  Hypertension blood pressure is controlled, on Valsartan 119m qd.   Venous insufficiency (chronic) (peripheral) Peripheral edema, better, not apparent, on Furosemide 29mqd. May be reduce to 1057mn the future.   CKD (chronic kidney disease) stage 3, GFR 30-59 ml/min (HCC) CKD, Bun/creat 30/1.0, eGFR 50  Anemia Anemia, Hgb 11.6, Iron 66 11/29/19    Incontinent of urine Her urinary frequency/leakage, uses pads, remains no change, on Myrbetriq. F/u Urology   Slow transit constipation Managed on diet, prn Fibercon  Blister (nonthermal) of lip, initial encounter Onset is about  2 weeks since the dental work, 2 small blisters lower lip, ruptured with a needle tip, small serous drainage removed, will apply Abbreva qid x 7 days. May consider dermatology consultation if no better.    Labs/tests ordered: none. DEXA pending, ordered.   Next appt:  3-4 months with Dr. GupLyndel Safe

## 2020-02-21 NOTE — Patient Instructions (Signed)
Dexa scan is due.

## 2020-02-21 NOTE — Assessment & Plan Note (Signed)
CKD, Bun/creat 30/1.0, eGFR 50

## 2020-02-21 NOTE — Assessment & Plan Note (Signed)
Her urinary frequency/leakage, uses pads, remains no change, on Myrbetriq. F/u Urology

## 2020-02-21 NOTE — Assessment & Plan Note (Signed)
Managed on diet, prn Fibercon

## 2020-02-21 NOTE — Assessment & Plan Note (Signed)
Onset is about  2 weeks since the dental work, 2 small blisters lower lip, ruptured with a needle tip, small serous drainage removed, will apply Abbreva qid x 7 days. May consider dermatology consultation if no better.

## 2020-02-21 NOTE — Assessment & Plan Note (Addendum)
Peripheral edema, better, not apparent, on Furosemide 20mg  qd. May be reduce to 10mg  in the future.

## 2020-02-21 NOTE — Assessment & Plan Note (Signed)
Anemia, Hgb 11.6, Iron 66 11/29/19

## 2020-02-21 NOTE — Assessment & Plan Note (Signed)
blood pressure is controlled, on Valsartan 160mg  qd.

## 2020-03-12 ENCOUNTER — Other Ambulatory Visit: Payer: Self-pay | Admitting: Internal Medicine

## 2020-03-12 DIAGNOSIS — I739 Peripheral vascular disease, unspecified: Secondary | ICD-10-CM

## 2020-04-22 DIAGNOSIS — Z23 Encounter for immunization: Secondary | ICD-10-CM | POA: Diagnosis not present

## 2020-05-14 ENCOUNTER — Other Ambulatory Visit: Payer: Self-pay | Admitting: Internal Medicine

## 2020-05-15 NOTE — Telephone Encounter (Signed)
Patient is requesting refill on medication "Potassium Chloride". Last refill was 11/27/2019 with 90 tablets to be taken daily and 1 additional refill. Patient is due for refills again. Patient has this medication on list twice. I tried to send medication in but warning comes up.

## 2020-05-30 ENCOUNTER — Other Ambulatory Visit: Payer: Self-pay

## 2020-05-30 ENCOUNTER — Non-Acute Institutional Stay: Payer: Medicare Other | Admitting: Internal Medicine

## 2020-05-30 ENCOUNTER — Encounter: Payer: Self-pay | Admitting: Internal Medicine

## 2020-05-30 VITALS — BP 152/98 | HR 98 | Temp 97.9°F | Ht 63.0 in | Wt 120.4 lb

## 2020-05-30 DIAGNOSIS — I872 Venous insufficiency (chronic) (peripheral): Secondary | ICD-10-CM

## 2020-05-30 DIAGNOSIS — I10 Essential (primary) hypertension: Secondary | ICD-10-CM | POA: Diagnosis not present

## 2020-05-30 DIAGNOSIS — D649 Anemia, unspecified: Secondary | ICD-10-CM | POA: Diagnosis not present

## 2020-05-30 DIAGNOSIS — N1831 Chronic kidney disease, stage 3a: Secondary | ICD-10-CM

## 2020-05-30 DIAGNOSIS — K130 Diseases of lips: Secondary | ICD-10-CM | POA: Diagnosis not present

## 2020-05-30 DIAGNOSIS — Z780 Asymptomatic menopausal state: Secondary | ICD-10-CM

## 2020-05-30 DIAGNOSIS — M8580 Other specified disorders of bone density and structure, unspecified site: Secondary | ICD-10-CM

## 2020-05-30 DIAGNOSIS — N3942 Incontinence without sensory awareness: Secondary | ICD-10-CM

## 2020-05-30 NOTE — Patient Instructions (Signed)
Can get Shingrix shot from any pharmacy Will order DEXA scan

## 2020-05-30 NOTE — Progress Notes (Signed)
Location:  [place] of Service:  Clinic (12)  Provider:   Code Status:  Goals of Care:  Advanced Directives 02/21/2020  Does Patient Have a Medical Advance Directive? Yes  Type of Advance Directive Out of facility DNR (pink MOST or yellow form);Healthcare Power of Attorney  Does patient want to make changes to medical advance directive? No - Patient declined  Copy of [place] in Chart? Yes - validated most recent copy scanned in chart (See row information)  Pre-existing out of facility DNR order (yellow form or pink MOST form) Pink MOST form placed in chart (order not valid for inpatient use);Yellow form placed in chart (order not valid for inpatient use)     Chief Complaint  Patient presents with  . Medical Management of Chronic Issues    Patient returns to the clinic for follow up.     HPI: Patient is a 86 y.o. female seen today for medical management of chronic diseases.   Has h/o  Urinary Incontinence On Myrbetriq and Symptoms more controlled Follows with Urology PRN LE edema Controlled on Lasix Hypertension No Acute issues today  Patient is active otherwise does not drive anymore.  Has a daughter who lives close by. Also has Boy friend  which she goes out with for walking on the trails She has not had any falls . walk with a cane Past Medical History:  Diagnosis Date  . Hypertension     History reviewed. No pertinent surgical history.  Allergies  Allergen Reactions  . Ace Inhibitors   . Flagyl [Metronidazole]   . Penicillins Swelling    facial    Outpatient Encounter Medications as of 05/30/2020  Medication Sig  . aspirin EC 81 MG tablet Take 81 mg by mouth daily.  . Calcium Carb-Cholecalciferol 500-400 MG-UNIT TABS Take 1 tablet by mouth daily.  . cholecalciferol (VITAMIN D) 1000 units tablet Take 2,000 Units by mouth daily.  . Docosanol (ABREVA) 10 % CREA Apply to the lower leg blisters 5x/day x 10days  .  furosemide (LASIX) 20 MG tablet TAKE 1 TABLET BY MOUTH  DAILY  . mirabegron ER (MYRBETRIQ) 50 MG TB24 tablet Take 50 mg by mouth daily.  . Multiple Vitamin (MULTIVITAMIN WITH MINERALS) TABS tablet Take 1 tablet by mouth daily.  . Multiple Vitamins-Minerals (ICAPS AREDS 2 PO) Take by mouth 2 (two) times daily.  . polycarbophil (FIBERCON) 625 MG tablet Take 625 mg by mouth as needed.   . potassium chloride (K-DUR) 10 MEQ tablet Take 1 tablet (10 mEq total) by mouth daily.  . potassium chloride (KLOR-CON) 10 MEQ tablet TAKE 1 TABLET BY MOUTH  DAILY  . valsartan (DIOVAN) 160 MG tablet TAKE 1 TABLET BY MOUTH  DAILY  . vitamin C (ASCORBIC ACID) 250 MG tablet Take 250 mg by mouth daily.  . [DISCONTINUED] MYRBETRIQ 25 MG TB24 tablet TAKE 1 TABLET BY MOUTH EVERY DAY   No facility-administered encounter medications on file as of 05/30/2020.    Review of Systems:  Review of Systems  Review of Systems  Constitutional: Negative for activity change, appetite change, chills, diaphoresis, fatigue and fever.  HENT: Negative for mouth sores, postnasal drip, rhinorrhea, sinus pain and sore throat.  LIP Sore Respiratory: Negative for apnea, cough, chest tightness, shortness of breath and wheezing.   Cardiovascular: Negative for chest pain, palpitations and leg swelling.  Gastrointestinal: Negative for abdominal distention, abdominal pain, constipation, diarrhea, nausea and vomiting.  Genitourinary: Negative for dysuria and  frequency.  Musculoskeletal: Negative for arthralgias, joint swelling and myalgias.  Skin: Negative for rash.  Neurological: Negative for dizziness, syncope, weakness, light-headedness and numbness.  Psychiatric/Behavioral: Negative for behavioral problems, confusion and sleep disturbance.     Health Maintenance  Topic Date Due  . DEXA SCAN  Never done  . COVID-19 Vaccine (3 - Moderna risk 4-dose series) 08/13/2019  . INFLUENZA VACCINE  01/13/2020  . TETANUS/TDAP  11/26/2029  . PNA  vac Low Risk Adult  Completed    Physical Exam: Vitals:   05/30/20 1039  BP: (!) 152/98  Pulse: 98  Temp: 97.9 F (36.6 C)  SpO2: 97%  Weight: 120 lb 6.4 oz (54.6 kg)  Height: 5\' 3"  (1.6 m)   Body mass index is 21.33 kg/m. Physical Exam  Constitutional: Oriented to person, place, and time. Well-developed and well-nourished. Does have Kyphosis HENT:  Head: Normocephalic.  Mouth/Throat: Oropharynx is clear and moist.  Eyes: Pupils are equal, round, and reactive to light.  Neck: Neck supple.  Cardiovascular: Normal rate and normal heart sounds.  No murmur heard. Pulmonary/Chest: Effort normal and breath sounds normal. No respiratory distress. No wheezes. She has no rales.  Abdominal: Soft. Bowel sounds are normal. No distension. There is no tenderness. There is no rebound.  Musculoskeletal: No edema.  Lymphadenopathy: none Neurological: Alert and oriented to person, place, and time. Gait stable Walks with the Cane Skin: Skin is warm and dry.  Psychiatric: Normal mood and affect. Behavior is normal. Thought content normal.    Labs reviewed: Basic Metabolic Panel: Recent Labs    11/29/19 0700  NA 139  K 4.4  CL 102  CO2 32  GLUCOSE 80  BUN 30*  CREATININE 1.00*  CALCIUM 10.2  TSH 1.99   Liver Function Tests: Recent Labs    11/29/19 0700  AST 24  ALT 20  BILITOT 0.6  PROT 6.7   No results for input(s): LIPASE, AMYLASE in the last 8760 hours. No results for input(s): AMMONIA in the last 8760 hours. CBC: Recent Labs    07/04/19 0912 11/29/19 0700  WBC 5.4 6.0  NEUTROABS 2,743 3,276  HGB 10.7* 11.6*  HCT 32.9* 35.1  MCV 96.8 96.4  PLT 211 213   Lipid Panel: No results for input(s): CHOL, HDL, LDLCALC, TRIG, CHOLHDL, LDLDIRECT in the last 8760 hours. No results found for: HGBA1C  Procedures since last visit: No results found.  Assessment/Plan 1. Essential hypertension Continue Lasix and Diovan  2. Stage 3a chronic kidney disease (HCC) Creat  Stable Repeat BMP Vit D Level was good  3. Anemia, unspecified type Hgb stable Iron studies Showed Iron sats were 23 %  4. Lip lesion Almost healed But if persists will contact our office for ? ENT Referal  5. Urinary incontinence without sensory awareness Doing well on Higher dose of Myrbetriq  6. Venous insufficiency (chronic) (peripheral) Takes Lasix  7. Osteopenia, unspecified location Ordered DEXA scan  Will Get Shingrix from [place]  Labs/tests ordered:  * No order type specified * Next appt:  11/20/2020

## 2020-06-23 DIAGNOSIS — L989 Disorder of the skin and subcutaneous tissue, unspecified: Secondary | ICD-10-CM | POA: Diagnosis not present

## 2020-06-23 DIAGNOSIS — D485 Neoplasm of uncertain behavior of skin: Secondary | ICD-10-CM | POA: Diagnosis not present

## 2020-07-31 DIAGNOSIS — D040 Carcinoma in situ of skin of lip: Secondary | ICD-10-CM | POA: Diagnosis not present

## 2020-08-05 ENCOUNTER — Other Ambulatory Visit: Payer: Self-pay | Admitting: Nurse Practitioner

## 2020-08-28 DIAGNOSIS — R351 Nocturia: Secondary | ICD-10-CM | POA: Diagnosis not present

## 2020-08-28 DIAGNOSIS — N3941 Urge incontinence: Secondary | ICD-10-CM | POA: Diagnosis not present

## 2020-09-23 DIAGNOSIS — L570 Actinic keratosis: Secondary | ICD-10-CM | POA: Diagnosis not present

## 2020-09-23 DIAGNOSIS — L820 Inflamed seborrheic keratosis: Secondary | ICD-10-CM | POA: Diagnosis not present

## 2020-09-23 DIAGNOSIS — D692 Other nonthrombocytopenic purpura: Secondary | ICD-10-CM | POA: Diagnosis not present

## 2020-09-23 DIAGNOSIS — Z85068 Personal history of other malignant neoplasm of small intestine: Secondary | ICD-10-CM | POA: Diagnosis not present

## 2020-09-23 DIAGNOSIS — L814 Other melanin hyperpigmentation: Secondary | ICD-10-CM | POA: Diagnosis not present

## 2020-09-23 DIAGNOSIS — D1801 Hemangioma of skin and subcutaneous tissue: Secondary | ICD-10-CM | POA: Diagnosis not present

## 2020-09-23 DIAGNOSIS — L821 Other seborrheic keratosis: Secondary | ICD-10-CM | POA: Diagnosis not present

## 2020-10-28 ENCOUNTER — Other Ambulatory Visit (HOSPITAL_BASED_OUTPATIENT_CLINIC_OR_DEPARTMENT_OTHER): Payer: Self-pay

## 2020-10-28 ENCOUNTER — Other Ambulatory Visit: Payer: Self-pay

## 2020-10-28 ENCOUNTER — Ambulatory Visit: Payer: Medicare Other | Attending: Internal Medicine

## 2020-10-28 ENCOUNTER — Ambulatory Visit: Payer: Medicare Other

## 2020-10-28 DIAGNOSIS — Z23 Encounter for immunization: Secondary | ICD-10-CM

## 2020-10-28 MED ORDER — COVID-19 MRNA VACC (MODERNA) 100 MCG/0.5ML IM SUSP
INTRAMUSCULAR | 0 refills | Status: DC
  Filled 2020-10-28: qty 0.25, 1d supply, fill #0

## 2020-10-28 NOTE — Progress Notes (Signed)
   Covid-19 Vaccination Clinic  Name:  Peggy [name]    MRN: [id number] DOB: [birth year]  10/28/2020  [name] was observed post Covid-19 immunization for 15 minutes without incident. She was provided with Vaccine Information Sheet and instruction to access the V-Safe system.   [name] was instructed to call 911 with any severe reactions post vaccine: Marland Kitchen Difficulty breathing  . Swelling of face and throat  . A fast heartbeat  . A bad rash all over body  . Dizziness and weakness   Immunizations Administered    Name Date Dose VIS Date Route   Moderna Covid-19 Booster Vaccine 10/28/2020 12:15 PM 0.25 mL 04/02/2020 Intramuscular   Manufacturer: Moderna   Lot: [id number]   [place]: [id number]

## 2020-11-05 ENCOUNTER — Ambulatory Visit
Admission: RE | Admit: 2020-11-05 | Discharge: 2020-11-05 | Disposition: A | Discharge status: Home / Self Care | Payer: Medicare Other | Source: Ambulatory Visit | Attending: Internal Medicine | Admitting: Internal Medicine

## 2020-11-05 ENCOUNTER — Other Ambulatory Visit: Payer: Self-pay

## 2020-11-05 DIAGNOSIS — M810 Age-related osteoporosis without current pathological fracture: Secondary | ICD-10-CM | POA: Diagnosis not present

## 2020-11-05 DIAGNOSIS — Z780 Asymptomatic menopausal state: Secondary | ICD-10-CM | POA: Diagnosis not present

## 2020-11-05 DIAGNOSIS — M8580 Other specified disorders of bone density and structure, unspecified site: Secondary | ICD-10-CM

## 2020-11-20 ENCOUNTER — Other Ambulatory Visit: Payer: Self-pay

## 2020-11-20 DIAGNOSIS — I10 Essential (primary) hypertension: Secondary | ICD-10-CM | POA: Diagnosis not present

## 2020-11-20 DIAGNOSIS — D692 Other nonthrombocytopenic purpura: Secondary | ICD-10-CM | POA: Diagnosis not present

## 2020-11-20 DIAGNOSIS — D649 Anemia, unspecified: Secondary | ICD-10-CM | POA: Diagnosis not present

## 2020-11-20 DIAGNOSIS — Z85068 Personal history of other malignant neoplasm of small intestine: Secondary | ICD-10-CM | POA: Diagnosis not present

## 2020-11-20 DIAGNOSIS — N1831 Chronic kidney disease, stage 3a: Secondary | ICD-10-CM

## 2020-11-20 DIAGNOSIS — L820 Inflamed seborrheic keratosis: Secondary | ICD-10-CM | POA: Diagnosis not present

## 2020-11-20 DIAGNOSIS — L570 Actinic keratosis: Secondary | ICD-10-CM | POA: Diagnosis not present

## 2020-11-20 DIAGNOSIS — L814 Other melanin hyperpigmentation: Secondary | ICD-10-CM | POA: Diagnosis not present

## 2020-11-21 LAB — COMPLETE METABOLIC PANEL WITH GFR
AG Ratio: 1.5 (calc) (ref 1.0–2.5)
ALT: 15 U/L (ref 6–29)
AST: 20 U/L (ref 10–35)
Albumin: 4 g/dL (ref 3.6–5.1)
Alkaline phosphatase (APISO): 55 U/L (ref 37–153)
BUN/Creatinine Ratio: 23 (calc) — ABNORMAL HIGH (ref 6–22)
BUN: 44 mg/dL — ABNORMAL HIGH (ref 7–25)
CO2: 26 mmol/L (ref 20–32)
Calcium: 10.4 mg/dL (ref 8.6–10.4)
Chloride: 102 mmol/L (ref 98–110)
Creat: 1.89 mg/dL — ABNORMAL HIGH (ref 0.60–0.88)
GFR, Est African American: 27 mL/min/{1.73_m2} — ABNORMAL LOW (ref >=60)
GFR, Est Non African American: 23 mL/min/{1.73_m2} — ABNORMAL LOW (ref >=60)
Globulin: 2.7 g/dL (calc) (ref 1.9–3.7)
Glucose, Bld: 78 mg/dL (ref 65–99)
Potassium: 4.2 mmol/L (ref 3.5–5.3)
Sodium: 138 mmol/L (ref 135–146)
Total Bilirubin: 0.5 mg/dL (ref 0.2–1.2)
Total Protein: 6.7 g/dL (ref 6.1–8.1)

## 2020-11-21 LAB — CBC WITH DIFFERENTIAL/PLATELET
Absolute Monocytes: 613 cells/uL (ref 200–950)
Basophils Absolute: 73 cells/uL (ref 0–200)
Basophils Relative: 1 %
Eosinophils Absolute: 1212 cells/uL — ABNORMAL HIGH (ref 15–500)
Eosinophils Relative: 16.6 %
HCT: 27.1 % — ABNORMAL LOW (ref 35.0–45.0)
Hemoglobin: 9 g/dL — ABNORMAL LOW (ref 11.7–15.5)
Lymphs Abs: 1789 cells/uL (ref 850–3900)
MCH: 31.9 pg (ref 27.0–33.0)
MCHC: 33.2 g/dL (ref 32.0–36.0)
MCV: 96.1 fL (ref 80.0–100.0)
MPV: 10.5 fL (ref 7.5–12.5)
Monocytes Relative: 8.4 %
Neutro Abs: 3614 cells/uL (ref 1500–7800)
Neutrophils Relative %: 49.5 %
Platelets: 228 10*3/uL (ref 140–400)
RBC: 2.82 10*6/uL — ABNORMAL LOW (ref 3.80–5.10)
RDW: 12.6 % (ref 11.0–15.0)
Total Lymphocyte: 24.5 %
WBC: 7.3 10*3/uL (ref 3.8–10.8)

## 2020-11-21 LAB — TSH: TSH: 2.48 mIU/L (ref 0.40–4.50)

## 2020-11-27 ENCOUNTER — Encounter: Payer: Medicare Other | Admitting: Nurse Practitioner

## 2020-11-28 ENCOUNTER — Other Ambulatory Visit: Payer: Self-pay | Admitting: Nurse Practitioner

## 2020-11-28 ENCOUNTER — Encounter: Payer: Self-pay | Admitting: Nurse Practitioner

## 2020-11-28 DIAGNOSIS — M810 Age-related osteoporosis without current pathological fracture: Secondary | ICD-10-CM | POA: Insufficient documentation

## 2020-11-28 DIAGNOSIS — N1831 Chronic kidney disease, stage 3a: Secondary | ICD-10-CM

## 2020-11-28 DIAGNOSIS — K219 Gastro-esophageal reflux disease without esophagitis: Secondary | ICD-10-CM

## 2020-11-28 DIAGNOSIS — D649 Anemia, unspecified: Secondary | ICD-10-CM

## 2020-11-28 NOTE — Progress Notes (Signed)
cbc

## 2020-12-04 ENCOUNTER — Non-Acute Institutional Stay: Payer: Medicare Other | Admitting: Nurse Practitioner

## 2020-12-04 ENCOUNTER — Other Ambulatory Visit: Payer: Self-pay

## 2020-12-04 ENCOUNTER — Encounter: Payer: Self-pay | Admitting: Nurse Practitioner

## 2020-12-04 DIAGNOSIS — I10 Essential (primary) hypertension: Secondary | ICD-10-CM | POA: Diagnosis not present

## 2020-12-04 DIAGNOSIS — M810 Age-related osteoporosis without current pathological fracture: Secondary | ICD-10-CM

## 2020-12-04 DIAGNOSIS — K5901 Slow transit constipation: Secondary | ICD-10-CM

## 2020-12-04 DIAGNOSIS — I872 Venous insufficiency (chronic) (peripheral): Secondary | ICD-10-CM

## 2020-12-04 DIAGNOSIS — N1831 Chronic kidney disease, stage 3a: Secondary | ICD-10-CM | POA: Diagnosis not present

## 2020-12-04 DIAGNOSIS — D649 Anemia, unspecified: Secondary | ICD-10-CM

## 2020-12-04 DIAGNOSIS — N3942 Incontinence without sensory awareness: Secondary | ICD-10-CM | POA: Diagnosis not present

## 2020-12-04 NOTE — Assessment & Plan Note (Signed)
Her urinary frequency/leakage, uses pads, it seems better since increased Myrbetriq per Urology

## 2020-12-04 NOTE — Assessment & Plan Note (Signed)
blood pressure is controlled, on Valsartan 160mg  qd.

## 2020-12-04 NOTE — Assessment & Plan Note (Signed)
Peripheral edema, no apparent, held Furosemide 20mg  qd. Bun/creat 44/1.89 11/20/20. Dc Furosemide/Kcl

## 2020-12-04 NOTE — Assessment & Plan Note (Addendum)
11/20/20 wbc 7.3, Hgb 9.0, plt 228, neutrophils 49.5%, TSH 2.48, repeat CBC/diff, anemia panel 12/02/20? Will reschedule 12/09/20. Hold ASA for now. May consider acid reducer for GI protection.

## 2020-12-04 NOTE — Assessment & Plan Note (Signed)
Bun/creat 44/1.89 eGFR 23 11/20/20, held Furosemide since 11/20/20, repeat CMP/eGFR 12/02/20?

## 2020-12-04 NOTE — Assessment & Plan Note (Signed)
No constipation, taking Fibercon prn.

## 2020-12-04 NOTE — Progress Notes (Addendum)
Location:   clinic [place]   Place of Service:  Clinic (12) Provider: Marlana Latus NP  Code Status: DNR Goals of Care: [place] 12/04/2020  Does Patient Have a Medical Advance Directive? Yes  Type of Paramedic of [place];Out of facility DNR (pink MOST or yellow form)  Does patient want to make changes to medical advance directive? No - Patient declined  Copy of [place] in Chart? Yes - validated most recent copy scanned in chart (See row information)  Pre-existing out of facility DNR order (yellow form or pink MOST form) Yellow form placed in chart (order not valid for inpatient use);Pink MOST form placed in chart (order not valid for inpatient use)     Chief Complaint  Patient presents with  . Medical Management of Chronic Issues    6 month follow up  . Health Maintenance    Discuss need for shingles vaccine    HPI: Patient is a 87 y.o. female seen today for medical management of chronic diseases.    Peripheral edema, no apparent, held Furosemide 37m qd. Bun/creat 44/1.89 11/20/20             Her urinary frequency/leakage, uses pads, it seems better since increased Myrbetriq per Urology             HTN, blood pressure is controlled, on Valsartan 1634mqd.             No constipation, taking Fibercon prn.              CKD, Bun/creat 44/1.89 eGFR 23 11/20/20, held Furosemide since 11/20/20, repeat CMP/eGFR 12/02/20?             Anemia, Hgb 9.0 11/20/20 11/20/20, ? Repeat CBC/diff, anemia penal.    Past Medical History:  Diagnosis Date  . Hypertension     History reviewed. No pertinent surgical history.  Allergies  Allergen Reactions  . Ace Inhibitors   . Flagyl [Metronidazole]   . Penicillins Swelling    facial    Allergies as of 12/04/2020       Reactions   Ace Inhibitors    Flagyl [metronidazole]    Penicillins Swelling   facial        Medication List        Accurate as of December 04, 2020 11:59 PM. If you  have any questions, ask your nurse or doctor.          aspirin EC 81 MG tablet Take 81 mg by mouth daily.   Calcium Carb-Cholecalciferol 500-400 MG-UNIT Tabs Take 1 tablet by mouth daily.   cholecalciferol 25 MCG (1000 UNIT) tablet Commonly known as: VITAMIN D Take 2,000 Units by mouth daily.   Docosanol 10 % Crea Commonly known as: Abreva Apply to the lower leg blisters 5x/day x 10days   furosemide 20 MG tablet Commonly known as: LASIX TAKE 1 TABLET BY MOUTH  DAILY   ICAPS AREDS 2 PO Take by mouth 2 (two) times daily.   mirabegron ER 50 MG Tb24 tablet Commonly known as: MYRBETRIQ Take 50 mg by mouth daily.   Moderna COVID-19 Vaccine 100 MCG/0.5ML injection Generic drug: COVID-19 mRNA vaccine (Moderna) Inject into the muscle.   multivitamin with minerals Tabs tablet Take 1 tablet by mouth daily.   polycarbophil 625 MG tablet Commonly known as: FIBERCON Take 625 mg by mouth as needed.   potassium chloride 10 MEQ tablet Commonly known as: KLOR-CON Take 1 tablet (10 mEq total)  by mouth daily.   potassium chloride 10 MEQ tablet Commonly known as: KLOR-CON TAKE 1 TABLET BY MOUTH  DAILY   valsartan 160 MG tablet Commonly known as: DIOVAN TAKE 1 TABLET BY MOUTH  DAILY   vitamin C 250 MG tablet Commonly known as: ASCORBIC ACID Take 250 mg by mouth daily.        Review of Systems:  Review of Systems  Constitutional:  Negative for activity change, appetite change and fever.  HENT:  Positive for hearing loss. Negative for congestion and voice change.   Eyes:  Negative for visual disturbance.  Respiratory:  Negative for cough and shortness of breath.   Cardiovascular:  Negative for leg swelling.  Gastrointestinal:  Negative for abdominal pain, blood in stool, constipation, nausea and vomiting.       No indigestion, upset stomach.   Genitourinary:  Positive for frequency. Negative for dysuria and urgency.       Occasionally incontinent of urine. Urination  average every 4 hrs during day 1x/night.   Musculoskeletal:  Positive for gait problem.       Cane walking.   Skin:  Negative for color change.  Neurological:  Negative for speech difficulty, weakness and light-headedness.       Memory lapses.   Psychiatric/Behavioral:  Negative for behavioral problems and sleep disturbance. The patient is not nervous/anxious.    Health Maintenance  Topic Date Due  . Zoster Vaccines- Shingrix (1 of 2) Never done  . INFLUENZA VACCINE  01/12/2021  . COVID-19 Vaccine (4 - Booster for Moderna series) 01/28/2021  . TETANUS/TDAP  11/26/2029  . DEXA SCAN  Completed  . PNA vac Low Risk Adult  Completed  . HPV VACCINES  Aged Out    Physical Exam: Vitals:   12/04/20 1319  BP: (!) 146/76  Pulse: 72  Resp: 18  Temp: (!) 96.9 F (36.1 C)  SpO2: 94%  Weight: 113 lb 9.6 oz (51.5 kg)  Height: _0  (1.6 m)   Body mass index is 20.12 kg/m. Physical Exam Vitals reviewed.  Constitutional:      Appearance: Normal appearance. She is normal weight.  HENT:     Head: Normocephalic and atraumatic.     Mouth/Throat:     Mouth: Mucous membranes are dry.  Eyes:     Extraocular Movements: Extraocular movements intact.     Conjunctiva/sclera: Conjunctivae normal.     Pupils: Pupils are equal, round, and reactive to light.  Cardiovascular:     Rate and Rhythm: Normal rate and regular rhythm.     Heart sounds: No murmur heard. Pulmonary:     Breath sounds: No rales.  Abdominal:     General: Bowel sounds are normal.     Palpations: Abdomen is soft.     Tenderness: no abdominal tenderness  Musculoskeletal:     Cervical back: Normal range of motion and neck supple.     Right lower leg: No edema.     Left lower leg: No edema.     Comments: Cane walking.   Skin:    General: Skin is warm and dry.     Comments: Chronic pigmented venous insufficiency skin changes BLE  Neurological:     Mental Status: She is alert. Mental status is at baseline.     Gait: Gait  abnormal.     Comments: Oriented to person, place.   Psychiatric:        Mood and Affect: Mood normal.        Behavior:  Behavior normal.        Thought Content: Thought content normal.        Judgment: Judgment normal.    Labs reviewed: Basic Metabolic Panel: Recent Labs    11/20/20 0730  NA 138  K 4.2  CL 102  CO2 26  GLUCOSE 78  BUN 44*  CREATININE 1.89*  CALCIUM 10.4  TSH 2.48   Liver Function Tests: Recent Labs    11/20/20 0730  AST 20  ALT 15  BILITOT 0.5  PROT 6.7   No results for input(s): LIPASE, AMYLASE in the last 8760 hours. No results for input(s): AMMONIA in the last 8760 hours. CBC: Recent Labs    11/20/20 0730  WBC 7.3  NEUTROABS 3,614  HGB 9.0*  HCT 27.1*  MCV 96.1  PLT 228   Lipid Panel: No results for input(s): CHOL, HDL, LDLCALC, TRIG, CHOLHDL, LDLDIRECT in the last 8760 hours. No results found for: HGBA1C  Procedures since last visit: No results found.  Assessment/Plan  CKD (chronic kidney disease) stage 3, GFR 30-59 ml/min (HCC) Bun/creat 44/1.89 eGFR 23 11/20/20, held Furosemide since 11/20/20, repeat CMP/eGFR 12/02/20?  Anemia 11/20/20 wbc 7.3, Hgb 9.0, plt 228, neutrophils 49.5%, TSH 2.48, repeat CBC/diff, anemia panel 12/02/20? Will reschedule 12/09/20. Hold ASA for now. May consider acid reducer for GI protection.    Slow transit constipation No constipation, taking Fibercon prn.  Hypertension blood pressure is controlled, on Valsartan 122m qd.  Incontinent of urine Her urinary frequency/leakage, uses pads, it seems better since increased Myrbetriq per Urology  Venous insufficiency (chronic) (peripheral) Peripheral edema, no apparent, held Furosemide 27mqd. Bun/creat 44/1.89 11/20/20. Dc Furosemide/Kcl  Osteoporosis 11/05/20 DEXA t score -2.8, suggest starting Alendronate wkly if the patient agrees.    Labs/tests ordered pending CBC/diff, anemia panel, CMP/eGFR  Next appt:  2 weeks.

## 2020-12-05 ENCOUNTER — Encounter: Payer: Self-pay | Admitting: Nurse Practitioner

## 2020-12-05 NOTE — Assessment & Plan Note (Addendum)
11/05/20 DEXA t score -2.8, suggest starting Alendronate wkly if the patient agrees.

## 2020-12-08 DIAGNOSIS — N1831 Chronic kidney disease, stage 3a: Secondary | ICD-10-CM | POA: Diagnosis not present

## 2020-12-08 DIAGNOSIS — D649 Anemia, unspecified: Secondary | ICD-10-CM | POA: Diagnosis not present

## 2020-12-09 ENCOUNTER — Other Ambulatory Visit: Payer: Medicare Other

## 2020-12-09 ENCOUNTER — Other Ambulatory Visit: Payer: Self-pay

## 2020-12-09 ENCOUNTER — Other Ambulatory Visit: Payer: Self-pay | Admitting: Nurse Practitioner

## 2020-12-09 DIAGNOSIS — D649 Anemia, unspecified: Secondary | ICD-10-CM

## 2020-12-09 DIAGNOSIS — N1831 Chronic kidney disease, stage 3a: Secondary | ICD-10-CM

## 2020-12-09 DIAGNOSIS — M810 Age-related osteoporosis without current pathological fracture: Secondary | ICD-10-CM

## 2020-12-09 MED ORDER — ALENDRONATE SODIUM 70 MG PO TABS: 70.0000 mg | ORAL_TABLET | ORAL | 11 refills | Status: DC

## 2020-12-09 NOTE — Progress Notes (Signed)
Alendronate 70mg  weekly sent.

## 2020-12-10 LAB — CBC WITH DIFFERENTIAL/PLATELET
Absolute Monocytes: 494 cells/uL (ref 200–950)
Basophils Absolute: 91 cells/uL (ref 0–200)
Basophils Relative: 1.4 %
Eosinophils Absolute: 670 cells/uL — ABNORMAL HIGH (ref 15–500)
Eosinophils Relative: 10.3 %
HCT: 26.6 % — ABNORMAL LOW (ref 35.0–45.0)
Hemoglobin: 8.6 g/dL — ABNORMAL LOW (ref 11.7–15.5)
Lymphs Abs: 2002 cells/uL (ref 850–3900)
MCH: 31.9 pg (ref 27.0–33.0)
MCHC: 32.3 g/dL (ref 32.0–36.0)
MCV: 98.5 fL (ref 80.0–100.0)
MPV: 11.2 fL (ref 7.5–12.5)
Monocytes Relative: 7.6 %
Neutro Abs: 3244 cells/uL (ref 1500–7800)
Neutrophils Relative %: 49.9 %
Platelets: 180 10*3/uL (ref 140–400)
RBC: 2.7 10*6/uL — ABNORMAL LOW (ref 3.80–5.10)
RDW: 12.7 % (ref 11.0–15.0)
Total Lymphocyte: 30.8 %
WBC: 6.5 10*3/uL (ref 3.8–10.8)

## 2020-12-10 LAB — COMPLETE METABOLIC PANEL WITH GFR
AG Ratio: 1.7 (calc) (ref 1.0–2.5)
ALT: 12 U/L (ref 6–29)
AST: 21 U/L (ref 10–35)
Albumin: 4.2 g/dL (ref 3.6–5.1)
Alkaline phosphatase (APISO): 56 U/L (ref 37–153)
BUN/Creatinine Ratio: 22 (calc) (ref 6–22)
BUN: 42 mg/dL — ABNORMAL HIGH (ref 7–25)
CO2: 22 mmol/L (ref 20–32)
Calcium: 10.3 mg/dL (ref 8.6–10.4)
Chloride: 102 mmol/L (ref 98–110)
Creat: 1.92 mg/dL — ABNORMAL HIGH (ref 0.60–0.88)
GFR, Est African American: 26 mL/min/{1.73_m2} — ABNORMAL LOW (ref >=60)
GFR, Est Non African American: 23 mL/min/{1.73_m2} — ABNORMAL LOW (ref >=60)
Globulin: 2.5 g/dL (calc) (ref 1.9–3.7)
Glucose, Bld: 79 mg/dL (ref 65–99)
Potassium: 4.6 mmol/L (ref 3.5–5.3)
Sodium: 136 mmol/L (ref 135–146)
Total Bilirubin: 0.4 mg/dL (ref 0.2–1.2)
Total Protein: 6.7 g/dL (ref 6.1–8.1)

## 2020-12-10 LAB — IRON, TOTAL/TOTAL IRON BINDING CAP
%SAT: 25 % (calc) (ref 16–45)
Iron: 62 ug/dL (ref 45–160)
TIBC: 253 mcg/dL (calc) (ref 250–450)

## 2020-12-10 LAB — FERRITIN: Ferritin: 100 ng/mL (ref 16–288)

## 2020-12-11 ENCOUNTER — Encounter: Payer: Self-pay | Admitting: Nurse Practitioner

## 2020-12-11 ENCOUNTER — Non-Acute Institutional Stay: Payer: Medicare Other | Admitting: Nurse Practitioner

## 2020-12-11 DIAGNOSIS — D519 Vitamin B12 deficiency anemia, unspecified: Secondary | ICD-10-CM | POA: Diagnosis not present

## 2020-12-11 DIAGNOSIS — D649 Anemia, unspecified: Secondary | ICD-10-CM | POA: Diagnosis not present

## 2020-12-11 DIAGNOSIS — I872 Venous insufficiency (chronic) (peripheral): Secondary | ICD-10-CM | POA: Diagnosis not present

## 2020-12-11 DIAGNOSIS — N1831 Chronic kidney disease, stage 3a: Secondary | ICD-10-CM | POA: Diagnosis not present

## 2020-12-11 DIAGNOSIS — N3942 Incontinence without sensory awareness: Secondary | ICD-10-CM

## 2020-12-11 DIAGNOSIS — I10 Essential (primary) hypertension: Secondary | ICD-10-CM | POA: Diagnosis not present

## 2020-12-11 DIAGNOSIS — K5901 Slow transit constipation: Secondary | ICD-10-CM

## 2020-12-11 DIAGNOSIS — N183 Chronic kidney disease, stage 3 unspecified: Secondary | ICD-10-CM | POA: Diagnosis not present

## 2020-12-11 NOTE — Assessment & Plan Note (Signed)
blood pressure is controlled, on Valsartan 160mg  qd.

## 2020-12-11 NOTE — Assessment & Plan Note (Addendum)
Hgb 9.0 11/20/20 11/20/20>> 8.6, Iron 62,  12/08/20,  dc'd  ASA for now, repeat CBC/diff, Vit B12, Folate, added Omeprazole 20mg  qd for GI protection. FOBT x3

## 2020-12-11 NOTE — Progress Notes (Signed)
Location:   [facility] Room Number: [phone] Place of Service:  ALF [phone]) Provider:  Fraser Busche X Keasia Dubose, NP  Faruq Rosenberger X, NP  Patient Care Team: Lurene Robley X, NP as PCP - General (Internal Medicine)  Extended Emergency Contact Information Primary Emergency Contact: Resor,Floyd Address: [address]          [id number]          [place], [place] [address] [place] Phone: [phone] Relation: Spouse Secondary Emergency Contact: Hank[place] Phone: [phone] Work Phone: [phone] Relation: None  Code Status:  DNR Goals of care: Advanced Directive information Advanced Directives 12/11/2020  Does Patient Have a Medical Advance Directive? Yes  Type of Paramedic of [place];Out of facility DNR (pink MOST or yellow form)  Does patient want to make changes to medical advance directive? No - Patient declined  Copy of [place] in Chart? Yes - validated most recent copy scanned in chart (See row information)  Pre-existing out of facility DNR order (yellow form or pink MOST form) Yellow form placed in chart (order not valid for inpatient use);Pink MOST form placed in chart (order not valid for inpatient use)     Chief Complaint  Patient presents with   Acute Visit    Patient presents for chronic renal disease.    HPI:  Pt is a 87 y.o. female seen today for an acute visit for anemia, acute on chronic renal disease. The patient was advised to hold ASA and Furosemide for the past week, then repeated Hgb 9.0 11/20/20>>8.6 12/08/20, Bun/creat 44/1.89 11/20/20>> 42/1.92 12/08/20. Not sure if the patient is holding ASA and Furosemide or still taking them at home. She was subsequently admitted to [facility] for supervision and repeated CBC/diff, CMP/eGFR. The patient denied headache, chest pian/pressure, palpitation, SOB, abd pain, indigestion, nausea, vomiting, or blood in stool. The  patient does admit she avoid drinking fluid to reduce bathroom trips/urinary leakage.   Peripheral edema, no apparent, held Furosemide 68m qd. Bun/creat 44/1.89 11/20/20             Her urinary frequency/leakage, uses pads, it seems better since increased Myrbetriq per Urology             HTN, blood pressure is controlled, on Valsartan 1636mqd.             No constipation, taking Fibercon prn.              CKD, Bun/creat 44/1.89 eGFR 23 11/20/20>> 42/1.92 12/08/20 held Furosemide since 11/20/20             Anemia, Hgb 9.0 11/20/20 11/20/20>> 8.6, Iron 62,  12/08/20     Past Medical History:  Diagnosis Date   Hypertension    History reviewed. No pertinent surgical history.  Allergies  Allergen Reactions   Ace Inhibitors    Flagyl [Metronidazole]    Penicillins Swelling    facial    Allergies as of 12/11/2020       Reactions   Ace Inhibitors    Flagyl [metronidazole]    Penicillins Swelling   facial        Medication List        Accurate as of December 11, 2020  1:38 PM. If you have any questions, ask your nurse or doctor.          STOP taking these medications  aspirin EC 81 MG tablet Stopped by: Tamon Parkerson X Daviel Allegretto, NP   Docosanol 10 % Crea Commonly known as: Abreva Stopped by: Queen Abbett X Cailan General, NP   furosemide 20 MG tablet Commonly known as: LASIX Stopped by: Zonnie Landen X Dayane Hillenburg, NP   Moderna COVID-19 Vaccine 100 MCG/0.5ML injection Generic drug: COVID-19 mRNA vaccine (Moderna) Stopped by: Brynlyn Dade X Samiha Denapoli, NP   multivitamin with minerals Tabs tablet Stopped by: Jovonna Nickell X Jeshurun Oaxaca, NP   potassium chloride 10 MEQ tablet Commonly known as: KLOR-CON Stopped by: Ronetta Molla X Heena [place], NP   potassium chloride 10 MEQ tablet Commonly known as: KLOR-CON Stopped by: Jayli Fogleman X Laynee Lockamy, NP       TAKE these medications    alendronate 70 MG tablet Commonly known as: Fosamax Take 1 tablet (70 mg total) by mouth every 7 (seven) days. Take with a full glass of water on an empty stomach.   Calcium Carb-Cholecalciferol  500-400 MG-UNIT Tabs Take 1 tablet by mouth daily.   cholecalciferol 25 MCG (1000 UNIT) tablet Commonly known as: VITAMIN D Take 2,000 Units by mouth daily.   ICAPS AREDS 2 PO Take by mouth 2 (two) times daily.   mirabegron ER 50 MG Tb24 tablet Commonly known as: MYRBETRIQ Take 50 mg by mouth daily.   omeprazole 20 MG tablet Commonly known as: PRILOSEC OTC Take 20 mg by mouth daily.   polycarbophil 625 MG tablet Commonly known as: FIBERCON Take 625 mg by mouth as needed.   valsartan 160 MG tablet Commonly known as: DIOVAN TAKE 1 TABLET BY MOUTH  DAILY   vitamin C 250 MG tablet Commonly known as: ASCORBIC ACID Take 250 mg by mouth daily.        Review of Systems  Constitutional:  Negative for activity change, appetite change and fever.  HENT:  Positive for hearing loss. Negative for congestion and voice change.   Eyes:  Negative for visual disturbance.  Respiratory:  Negative for cough and shortness of breath.   Cardiovascular:  Negative for chest pain, palpitations and leg swelling.  Gastrointestinal:  Negative for abdominal pain, blood in stool, constipation, nausea and vomiting.       No indigestion, upset stomach.   Genitourinary:  Positive for frequency. Negative for dysuria and urgency.       Occasionally incontinent of urine. Urination average every 4 hrs during day 1x/night.   Musculoskeletal:  Positive for gait problem.       Cane walking.   Skin:  Negative for color change.  Neurological:  Negative for speech difficulty, weakness and light-headedness.       Memory lapses.   Psychiatric/Behavioral:  Negative for behavioral problems and sleep disturbance. The patient is not nervous/anxious.    Immunization History  Administered Date(s) Administered   Fluad Quad(high Dose 65+) 03/28/2019   H1N1 03/18/2009   Influenza, High Dose Seasonal PF 03/25/2017, 03/16/2018   Influenza-Unspecified 02/25/2011, 03/31/2012, 03/27/2013, 03/14/2014, 03/25/2016, 06/14/2017    Moderna SARS-COV2 Booster Vaccination 10/28/2020   Moderna Sars-Covid-2 Vaccination 06/18/2019, 07/16/2019   Pneumococcal Conjugate-13 09/10/2014   Pneumococcal Polysaccharide-23 11/03/2014   Pneumococcal-Unspecified 06/14/2017   Tdap 11/27/2019   Pertinent  Health Maintenance Due  Topic Date Due   INFLUENZA VACCINE  01/12/2021   DEXA SCAN  Completed   PNA vac Low Risk Adult  Completed   Fall Risk  12/04/2020 05/30/2020 11/23/2019 08/16/2019 05/03/2019  Falls in the past year? 0 0 0 0 0  Number falls in past yr: 0 0 0 0 0  Injury with  Fall? 0 - - - -  Risk for fall due to : No Fall Risks - - - -  Follow up Falls evaluation completed - - - -   Functional Status Survey:    Vitals:   12/11/20 0835  BP: 126/72  Pulse: 74  Resp: 18  Temp: 98.2 F (36.8 C)  SpO2: 95%  Weight: 113 lb (51.3 kg)  Height: _0  (1.6 m)   Body mass index is 20.02 kg/m. Physical Exam Vitals reviewed.  Constitutional:      Appearance: Normal appearance.  HENT:     Head: Normocephalic and atraumatic.     Nose: Nose normal.     Mouth/Throat:     Mouth: Mucous membranes are moist.  Eyes:     Extraocular Movements: Extraocular movements intact.     Conjunctiva/sclera: Conjunctivae normal.     Pupils: Pupils are equal, round, and reactive to light.  Cardiovascular:     Rate and Rhythm: Normal rate and regular rhythm.     Heart sounds: No murmur heard. Pulmonary:     Breath sounds: No rales.  Abdominal:     General: Bowel sounds are normal.     Palpations: Abdomen is soft.     Tenderness: There is no abdominal tenderness.  Musculoskeletal:     Cervical back: Normal range of motion and neck supple.     Right lower leg: No edema.     Left lower leg: No edema.     Comments: Cane walking.   Skin:    General: Skin is warm and dry.     Comments: Chronic pigmented venous insufficiency skin changes BLE  Neurological:     Mental Status: She is alert. Mental status is at baseline.     Gait: Gait  abnormal.     Comments: Oriented to person, place.   Psychiatric:        Mood and Affect: Mood normal.        Behavior: Behavior normal.        Thought Content: Thought content normal.        Judgment: Judgment normal.    Labs reviewed: Recent Labs    11/20/20 0730 12/08/20 0828  NA 138 136  K 4.2 4.6  CL 102 102  CO2 26 22  GLUCOSE 78 79  BUN 44* 42*  CREATININE 1.89* 1.92*  CALCIUM 10.4 10.3   Recent Labs    11/20/20 0730 12/08/20 0828  AST 20 21  ALT 15 12  BILITOT 0.5 0.4  PROT 6.7 6.7   Recent Labs    11/20/20 0730 12/08/20 0828  WBC 7.3 6.5  NEUTROABS 3,614 3,244  HGB 9.0* 8.6*  HCT 27.1* 26.6*  MCV 96.1 98.5  PLT 228 180   Lab Results  Component Value Date   TSH 2.48 11/20/2020   No results found for: HGBA1C Lab Results  Component Value Date   CHOL 159 08/16/2017   HDL 73 08/16/2017   LDLCALC 73 08/16/2017   TRIG 53 08/16/2017   CHOLHDL 2.2 08/16/2017    Significant Diagnostic Results in last 30 days:  No results found.  Assessment/Plan Anemia Hgb 9.0 11/20/20 11/20/20>> 8.6, Iron 62,  12/08/20,  dc'd  ASA for now, repeat CBC/diff, Vit B12, Folate, added Omeprazole 69m qd for GI protection. FOBT x3   CKD (chronic kidney disease) stage 3, GFR 30-59 ml/min (HCC) Bun/creat 44/1.89 eGFR 23 11/20/20>> 42/1.92 12/08/20 held Furosemide since 11/20/20--dc, repeat CMP/eGFR  Venous insufficiency (chronic) (peripheral) Peripheral edema, no  apparent, dc Furosemide 16m qd. Bun/creat 44/1.89 11/20/20>>42/1.92 12/08/20  Incontinent of urine Her urinary frequency/leakage, uses pads, it seems better since increased Myrbetriq per Urology  Hypertension blood pressure is controlled, on Valsartan 1655mqd.  Slow transit constipation No constipation, taking Fibercon prn.     Family/ staff Communication: plan of care reviewed with the patient, the patient's daughter, and charge nurse.   Labs/tests ordered:  CBC/diff, CMP/eGFR, Vit B12, Folate, FOBT x3  Time  spend 40 minutes.

## 2020-12-11 NOTE — Assessment & Plan Note (Signed)
Peripheral edema, no apparent, dc Furosemide 20mg  qd. Bun/creat 44/1.89 11/20/20>>42/1.92 12/08/20

## 2020-12-11 NOTE — Assessment & Plan Note (Signed)
Her urinary frequency/leakage, uses pads, it seems better since increased Myrbetriq per Urology

## 2020-12-11 NOTE — Assessment & Plan Note (Signed)
Bun/creat 44/1.89 eGFR 23 11/20/20>> 42/1.92 12/08/20 held Furosemide since 11/20/20--dc, repeat CMP/eGFR

## 2020-12-11 NOTE — Assessment & Plan Note (Signed)
No constipation, taking Fibercon prn.

## 2020-12-16 ENCOUNTER — Encounter: Payer: Self-pay | Admitting: Nurse Practitioner

## 2020-12-16 ENCOUNTER — Non-Acute Institutional Stay: Payer: Medicare Other | Admitting: Nurse Practitioner

## 2020-12-16 DIAGNOSIS — N3942 Incontinence without sensory awareness: Secondary | ICD-10-CM | POA: Diagnosis not present

## 2020-12-16 DIAGNOSIS — I10 Essential (primary) hypertension: Secondary | ICD-10-CM

## 2020-12-16 DIAGNOSIS — D649 Anemia, unspecified: Secondary | ICD-10-CM

## 2020-12-16 DIAGNOSIS — L129 Pemphigoid, unspecified: Secondary | ICD-10-CM | POA: Insufficient documentation

## 2020-12-16 DIAGNOSIS — N1831 Chronic kidney disease, stage 3a: Secondary | ICD-10-CM | POA: Diagnosis not present

## 2020-12-16 DIAGNOSIS — L03115 Cellulitis of right lower limb: Secondary | ICD-10-CM | POA: Diagnosis not present

## 2020-12-16 DIAGNOSIS — L03116 Cellulitis of left lower limb: Secondary | ICD-10-CM | POA: Insufficient documentation

## 2020-12-16 DIAGNOSIS — I872 Venous insufficiency (chronic) (peripheral): Secondary | ICD-10-CM | POA: Diagnosis not present

## 2020-12-16 NOTE — Assessment & Plan Note (Signed)
Bun/creat 44/1.89 11/20/20>> 42/1.92 12/09/20>> 40/1.67, eGFR 27 12/11/20, off Furosemide.

## 2020-12-16 NOTE — Assessment & Plan Note (Signed)
Hgb 9.0 11/20/20>>8.6 12/09/20>> 8.3 12/11/20, Omeprazole started for GI protection, FOBT negative x3. Denied GI symptoms or palpitation, chest pain, or SOB. Iron 62 12/09/20, Vit B12 506 12/11/20. Will add Iron 325mg  MWF, continue off ASA, update CBC/diff.

## 2020-12-16 NOTE — Assessment & Plan Note (Signed)
A patient's palm sized redness, warmth, painful area in right lower left calf, venous US to r/o DVT. Try Doxy 100mg  bid x 7days. Apply 1% Hydrocortisone cream bid BLE x 2weeks for venous dermatitis.

## 2020-12-16 NOTE — Assessment & Plan Note (Addendum)
blood pressure is mild elevated, asymptomatic, on Valsartan 160mg  qd, resume Furosemide will help Bp. Observe.

## 2020-12-16 NOTE — Progress Notes (Signed)
Location:   [facility] Room Number: 161 Place of Service:  ALF (13) Provider: Lennie Odor Cricket Goodlin NP  Franco Duley X, NP  Patient Care Team: Mega Kinkade X, NP as PCP - General (Internal Medicine)  Extended Emergency Contact Information Primary Emergency Contact: [place],Floyd Address: [address]          [id number]          [place], [place] [address] [place] Phone: [phone] Relation: Spouse Secondary Emergency Contact: [place] Phone: [phone] Work Phone: [phone] Relation: None  Code Status: DNR Goals of care: Advanced Directive information Advanced Directives 12/16/2020  Does Patient Have a Medical Advance Directive? Yes  Type of Paramedic of [employer];Out of facility DNR (pink MOST or yellow form)  Does patient want to make changes to medical advance directive? No - Patient declined  Copy of [place] in Chart? Yes - validated most recent copy scanned in chart (See row information)  Pre-existing out of facility DNR order (yellow form or pink MOST form) Yellow form placed in chart (order not valid for inpatient use);Pink MOST form placed in chart (order not valid for inpatient use)     Chief Complaint  Patient presents with   Acute Visit    Anemia, swelling leg     HPI:  Pt is a 87 y.o. female seen today for an acute visit for anemia, Hgb 8.3 12/11/20, Omeprazole started for GI protection, FOBT negative x3. Denied GI symptoms or palpitation, chest pain, or SOB. Increased swelling in legs, mostly left lower leg, new redness, warmth, painful area in the left calf. Bun/creat 40/1.67, eGFR 27 12/11/20, off Furosemide.   Peripheral edema, relapsed since off Furosemide             Her urinary frequency/leakage, uses pads, it seems better since increased Myrbetriq per Urology             HTN, blood pressure is controlled, on Valsartan 141m qd.             No  constipation, taking Fibercon prn.              CKD, improved since off Furosemide.              Anemia, Hgb continues dropping.     Past Medical History:  Diagnosis Date   Hypertension    History reviewed. No pertinent surgical history.  Allergies  Allergen Reactions   Ace Inhibitors    Flagyl [Metronidazole]    Penicillins Swelling    facial    Allergies as of 12/16/2020       Reactions   Ace Inhibitors    Flagyl [metronidazole]    Penicillins Swelling   facial        Medication List        Accurate as of December 16, 2020 11:59 PM. If you have any questions, ask your nurse or doctor.          alendronate 70 MG tablet Commonly known as: Fosamax Take 1 tablet (70 mg total) by mouth every 7 (seven) days. Take with a full glass of water on an empty stomach.   Calcium Carb-Cholecalciferol 500-400 MG-UNIT Tabs Take 1 tablet by mouth daily.   cholecalciferol 25 MCG (1000 UNIT) tablet Commonly known as: VITAMIN D Take 2,000 Units by mouth daily.   ICAPS AREDS 2 PO Take by mouth 2 (  two) times daily.   mirabegron ER 50 MG Tb24 tablet Commonly known as: MYRBETRIQ Take 50 mg by mouth daily.   omeprazole 20 MG tablet Commonly known as: PRILOSEC OTC Take 20 mg by mouth daily.   polycarbophil 625 MG tablet Commonly known as: FIBERCON Take 625 mg by mouth as needed.   valsartan 160 MG tablet Commonly known as: DIOVAN TAKE 1 TABLET BY MOUTH  DAILY   vitamin C 250 MG tablet Commonly known as: ASCORBIC ACID Take 250 mg by mouth daily.        Review of Systems  Constitutional:  Negative for activity change, appetite change and fever.  HENT:  Positive for hearing loss. Negative for congestion and voice change.   Eyes:  Negative for visual disturbance.  Respiratory:  Negative for cough and shortness of breath.   Cardiovascular:  Positive for leg swelling. Negative for chest pain and palpitations.  Gastrointestinal:  Negative for abdominal pain, blood in  stool, constipation, nausea and vomiting.       No indigestion, upset stomach.   Genitourinary:  Positive for frequency. Negative for dysuria and urgency.       Occasionally incontinent of urine. Urination average every 4 hrs during day 1x/night.   Musculoskeletal:  Positive for gait problem.       Cane walking.   Skin:  Positive for color change.  Neurological:  Negative for speech difficulty, weakness and light-headedness.       Memory lapses.   Psychiatric/Behavioral:  Negative for behavioral problems and sleep disturbance. The patient is not nervous/anxious.    Immunization History  Administered Date(s) Administered   Fluad Quad(high Dose 65+) 03/28/2019   H1N1 03/18/2009   Influenza, High Dose Seasonal PF 03/25/2017, 03/16/2018   Influenza-Unspecified 02/25/2011, 03/31/2012, 03/27/2013, 03/14/2014, 03/25/2016, 06/14/2017   Moderna SARS-COV2 Booster Vaccination 10/28/2020   Moderna Sars-Covid-2 Vaccination 06/18/2019, 07/16/2019   Pneumococcal Conjugate-13 09/10/2014   Pneumococcal Polysaccharide-23 11/03/2014   Pneumococcal-Unspecified 06/14/2017   Tdap 11/27/2019   Pertinent  Health Maintenance Due  Topic Date Due   INFLUENZA VACCINE  01/12/2021   DEXA SCAN  Completed   PNA vac Low Risk Adult  Completed   Fall Risk  12/04/2020 05/30/2020 11/23/2019 08/16/2019 05/03/2019  Falls in the past year? 0 0 0 0 0  Number falls in past yr: 0 0 0 0 0  Injury with Fall? 0 - - - -  Risk for fall due to : No Fall Risks - - - -  Follow up Falls evaluation completed - - - -   Functional Status Survey:    Vitals:   12/16/20 1438  BP: (!) 148/91  Pulse: 94  Temp: 99.2 F (37.3 C)  SpO2: 99%  Weight: 113 lb (51.3 kg)  Height: '5\' 3"'  (1.6 m)   Body mass index is 20.02 kg/m. Physical Exam Vitals reviewed.  Constitutional:      Appearance: Normal appearance.  HENT:     Head: Normocephalic and atraumatic.     Nose: Nose normal.     Mouth/Throat:     Mouth: Mucous membranes are  moist.  Eyes:     Extraocular Movements: Extraocular movements intact.     Conjunctiva/sclera: Conjunctivae normal.     Pupils: Pupils are equal, round, and reactive to light.  Cardiovascular:     Rate and Rhythm: Normal rate and regular rhythm.     Heart sounds: No murmur heard. Pulmonary:     Breath sounds: No rales.  Abdominal:  General: Bowel sounds are normal.     Palpations: Abdomen is soft.     Tenderness: There is no abdominal tenderness.  Musculoskeletal:     Cervical back: Normal range of motion and neck supple.     Right lower leg: Edema present.     Left lower leg: Edema present.     Comments: Cane walking. Edema 1+ RLE, 2-3 +LLE  Skin:    General: Skin is warm and dry.     Findings: Erythema present.     Comments: Chronic pigmented venous insufficiency skin changes BLE. A palm sized reddened area in the left lower calf is warm, tender to touch.   Neurological:     Mental Status: She is alert. Mental status is at baseline.     Gait: Gait abnormal.     Comments: Oriented to person, place.   Psychiatric:        Mood and Affect: Mood normal.        Behavior: Behavior normal.        Thought Content: Thought content normal.        Judgment: Judgment normal.    Labs reviewed: Recent Labs    11/20/20 0730 12/08/20 0828  NA 138 136  K 4.2 4.6  CL 102 102  CO2 26 22  GLUCOSE 78 79  BUN 44* 42*  CREATININE 1.89* 1.92*  CALCIUM 10.4 10.3   Recent Labs    11/20/20 0730 12/08/20 0828  AST 20 21  ALT 15 12  BILITOT 0.5 0.4  PROT 6.7 6.7   Recent Labs    11/20/20 0730 12/08/20 0828  WBC 7.3 6.5  NEUTROABS 3,614 3,244  HGB 9.0* 8.6*  HCT 27.1* 26.6*  MCV 96.1 98.5  PLT 228 180   Lab Results  Component Value Date   TSH 2.48 11/20/2020   No results found for: HGBA1C Lab Results  Component Value Date   CHOL 159 08/16/2017   HDL 73 08/16/2017   LDLCALC 73 08/16/2017   TRIG 53 08/16/2017   CHOLHDL 2.2 08/16/2017    Significant Diagnostic  Results in last 30 days:  No results found.  Assessment/Plan: Anemia Hgb 9.0 11/20/20>>8.6 12/09/20>> 8.3 12/11/20, Omeprazole started for GI protection, FOBT negative x3. Denied GI symptoms or palpitation, chest pain, or SOB. Iron 62 12/09/20, Vit B12 506 12/11/20. Will add Iron 313m MWF, continue off ASA, update CBC/diff.   CKD (chronic kidney disease) stage 3, GFR 30-59 ml/min (HCC) Bun/creat 44/1.89 11/20/20>> 42/1.92 12/09/20>> 40/1.67, eGFR 27 12/11/20, off Furosemide.   Venous insufficiency (chronic) (peripheral) Worsened edema BLE, resume Furosemide 280mKcl 201mpo qd, update CMP/eGFR  Cellulitis of right lower leg A patient's palm sized redness, warmth, painful area in right lower left calf, venous [place] r/o DVT. Try Doxy 100m57md x 7days. Apply 1% Hydrocortisone cream bid BLE x 2weeks for venous dermatitis.   Incontinent of urine Her urinary frequency/leakage, uses pads, it seems better since increased Myrbetriq per Urology  Hypertension blood pressure is controlled, on Valsartan 160mg32m    Family/ staff Communication: plan of care reviewed with the patient and charge nurse.   Labs/tests ordered: CBC/diff, CMP/eGFR  Time spend 40 minutes.

## 2020-12-16 NOTE — Assessment & Plan Note (Signed)
Her urinary frequency/leakage, uses pads, it seems better since increased Myrbetriq per Urology

## 2020-12-16 NOTE — Assessment & Plan Note (Signed)
Worsened edema BLE, resume Furosemide 22m/Kcl 260m po qd, update CMP/eGFR

## 2020-12-18 ENCOUNTER — Other Ambulatory Visit: Payer: Self-pay

## 2020-12-18 ENCOUNTER — Encounter: Payer: Self-pay | Admitting: Nurse Practitioner

## 2020-12-18 ENCOUNTER — Encounter: Payer: Medicare Other | Admitting: Nurse Practitioner

## 2020-12-18 DIAGNOSIS — I872 Venous insufficiency (chronic) (peripheral): Secondary | ICD-10-CM | POA: Diagnosis not present

## 2020-12-18 DIAGNOSIS — D6489 Other specified anemias: Secondary | ICD-10-CM | POA: Diagnosis not present

## 2020-12-19 DIAGNOSIS — R2242 Localized swelling, mass and lump, left lower limb: Secondary | ICD-10-CM | POA: Diagnosis not present

## 2020-12-19 DIAGNOSIS — L539 Erythematous condition, unspecified: Secondary | ICD-10-CM | POA: Diagnosis not present

## 2020-12-20 LAB — CBC AND DIFFERENTIAL
HCT: 25 — AB (ref 36–46)
Hemoglobin: 8.4 — AB (ref 12.0–16.0)
Neutrophils Absolute: 4795
Platelets: 257 (ref 150–399)
WBC: 7.9

## 2020-12-20 LAB — BASIC METABOLIC PANEL
BUN: 29 — AB (ref 4–21)
CO2: 25 — AB (ref 13–22)
Chloride: 104 (ref 99–108)
Creatinine: 1.9 — AB (ref 0.5–1.1)
Glucose: 56
Potassium: 4.5 (ref 3.4–5.3)
Sodium: 139 (ref 137–147)

## 2020-12-20 LAB — HEPATIC FUNCTION PANEL
ALT: 15 (ref 7–35)
AST: 20 (ref 13–35)
Alkaline Phosphatase: 52 (ref 25–125)
Bilirubin, Total: 0.6

## 2020-12-20 LAB — COMPREHENSIVE METABOLIC PANEL
Albumin: 4.2 (ref 3.5–5.0)
Calcium: 9.1 (ref 8.7–10.7)
Globulin: 2.6

## 2020-12-20 LAB — CBC: RBC: 2.65 — AB (ref 3.87–5.11)

## 2020-12-22 ENCOUNTER — Non-Acute Institutional Stay: Payer: Medicare Other | Admitting: Nurse Practitioner

## 2020-12-22 ENCOUNTER — Encounter: Payer: Self-pay | Admitting: Nurse Practitioner

## 2020-12-22 DIAGNOSIS — N1831 Chronic kidney disease, stage 3a: Secondary | ICD-10-CM

## 2020-12-22 DIAGNOSIS — L03116 Cellulitis of left lower limb: Secondary | ICD-10-CM

## 2020-12-22 DIAGNOSIS — K5901 Slow transit constipation: Secondary | ICD-10-CM

## 2020-12-22 DIAGNOSIS — I872 Venous insufficiency (chronic) (peripheral): Secondary | ICD-10-CM

## 2020-12-22 DIAGNOSIS — I10 Essential (primary) hypertension: Secondary | ICD-10-CM | POA: Diagnosis not present

## 2020-12-22 DIAGNOSIS — N3942 Incontinence without sensory awareness: Secondary | ICD-10-CM

## 2020-12-22 DIAGNOSIS — R21 Rash and other nonspecific skin eruption: Secondary | ICD-10-CM

## 2020-12-22 DIAGNOSIS — D649 Anemia, unspecified: Secondary | ICD-10-CM | POA: Diagnosis not present

## 2020-12-22 NOTE — Progress Notes (Addendum)
Location:   [place] Room Number: [place] of Service:  ALF [phone]) Provider:  Philopater Mucha X Traniya Prichett, NP  Jalesia Loudenslager X, NP  Patient Care Team: Beryle Bagsby X, NP as PCP - General (Internal Medicine)  Extended Emergency Contact Information Primary Emergency Contact: [place] Phone: [phone] Work Phone: [phone] Relation: None Secondary Emergency Contact: Bruck,[name] Address: [place], [place] [address] [name] Phone: [phone] Work Phone: [phone] Mobile Phone: [phone] Relation: Daughter  Code Status:  DNR Goals of care: Advanced Directive information Advanced Directives 12/24/2020  Does Patient Have a Medical Advance Directive? Yes  Type of Paramedic of [place];Living will;Out of facility DNR (pink MOST or yellow form)  Does patient want to make changes to medical advance directive? -  Copy of [place] in Chart? Yes - validated most recent copy scanned in chart (See row information)  Pre-existing out of facility DNR order (yellow form or pink MOST form) Pink MOST form placed in chart (order not valid for inpatient use);Yellow form placed in chart (order not valid for inpatient use)     Chief Complaint  Patient presents with   Acute Visit    Patient complains of itching under breasts and on arm.    HPI:  Pt is a 87 y.o. female seen today for an acute visit for under breast itching rash.   Cellulitis left lower leg, improved, 12/19/20 venous US LLE negative DVT  Anemia, Hgb 8.3 12/11/20 8.4 12/18/20 Omeprazole started for GI protection, FOBT negative x3, normal Fe, Vit B12, Folate. Added low dose Fe for trial.  Edema BLE, improved on Furosemide CKD Bun/creat 40/1.67, eGFR 27 12/11/20, then 29/1.89 eGFR 23 after Furosemide resumed             Her urinary frequency/leakage, uses pads, it seems better since increased Myrbetriq per Urology              HTN, blood pressure is controlled, on Valsartan 151m qd.             No constipation, taking Fibercon prn.                            Past Medical History:  Diagnosis Date   Hypertension    History reviewed. No pertinent surgical history.  Allergies  Allergen Reactions   Ace Inhibitors    Flagyl [Metronidazole]    Penicillins Swelling    facial    Allergies as of 12/22/2020       Reactions   Ace Inhibitors    Flagyl [metronidazole]    Penicillins Swelling   facial        Medication List        Accurate as of December 22, 2020 11:59 PM. If you have any questions, ask your nurse or doctor.          STOP taking these medications    omeprazole 20 MG tablet Commonly known as: PRILOSEC OTC Stopped by: Mariska Daffin X Mckenzie Bove, NP       TAKE these medications    alendronate 70 MG tablet Commonly known as: Fosamax Take 1 tablet (70 mg total) by mouth every 7 (seven) days. Take with a full glass of water on an empty stomach.   Calcium Carb-Cholecalciferol 500-400 MG-UNIT Tabs Take 1 tablet by mouth daily.   cholecalciferol 25 MCG (1000 UNIT)  tablet Commonly known as: VITAMIN D Take 2,000 Units by mouth daily.   doxycycline 100 MG tablet Commonly known as: VIBRA-TABS Take 100 mg by mouth 2 (two) times daily.   ferrous sulfate 325 (65 FE) MG tablet Take 325 mg by mouth daily with breakfast. MON, WED, FRI   furosemide 20 MG tablet Commonly known as: LASIX Take 20 mg by mouth daily.   hydrocortisone cream 1 % Apply 1 application topically 2 (two) times daily.   ICAPS AREDS 2 PO Take by mouth 2 (two) times daily.   mirabegron ER 50 MG Tb24 tablet Commonly known as: MYRBETRIQ Take 50 mg by mouth daily.   omeprazole 20 MG capsule Commonly known as: PRILOSEC Take 20 mg by mouth daily.   polycarbophil 625 MG tablet Commonly known as: FIBERCON Take 625 mg by mouth as needed.   potassium chloride SA 20 MEQ tablet Commonly known as: KLOR-CON Take 20  mEq by mouth daily.   valsartan 160 MG tablet Commonly known as: DIOVAN TAKE 1 TABLET BY MOUTH  DAILY   vitamin C 250 MG tablet Commonly known as: ASCORBIC ACID Take 250 mg by mouth daily.        Review of Systems  Constitutional:  Negative for activity change, appetite change and fever.  HENT:  Positive for hearing loss. Negative for congestion and voice change.   Eyes:  Negative for visual disturbance.  Respiratory:  Negative for cough and shortness of breath.   Cardiovascular:  Positive for leg swelling.  Gastrointestinal:  Negative for abdominal pain and constipation.       No indigestion, upset stomach.   Genitourinary:  Positive for frequency. Negative for dysuria and urgency.       Occasionally incontinent of urine. Urination average every 4 hrs during day 1x/night.   Musculoskeletal:  Positive for gait problem.       Cane walking.   Skin:  Positive for rash. Negative for color change.  Neurological:  Negative for speech difficulty, weakness and light-headedness.       Memory lapses.   Psychiatric/Behavioral:  Negative for behavioral problems and sleep disturbance. The patient is not nervous/anxious.    Immunization History  Administered Date(s) Administered   Fluad Quad(high Dose 65+) 03/28/2019   H1N1 03/18/2009   Influenza, High Dose Seasonal PF 03/25/2017, 03/16/2018   Influenza-Unspecified 02/25/2011, 03/31/2012, 03/27/2013, 03/14/2014, 03/25/2016, 06/14/2017   Moderna SARS-COV2 Booster Vaccination 10/28/2020   Moderna Sars-Covid-2 Vaccination 06/18/2019, 07/16/2019   Pneumococcal Conjugate-13 09/10/2014   Pneumococcal Polysaccharide-23 11/03/2014   Pneumococcal-Unspecified 06/14/2017   Tdap 11/27/2019   Pertinent  Health Maintenance Due  Topic Date Due   INFLUENZA VACCINE  01/12/2021   DEXA SCAN  Completed   PNA vac Low Risk Adult  Completed   Fall Risk  12/04/2020 05/30/2020 11/23/2019 08/16/2019 05/03/2019  Falls in the past year? 0 0 0 0 0  Number falls  in past yr: 0 0 0 0 0  Injury with Fall? 0 - - - -  Risk for fall due to : No Fall Risks - - - -  Follow up Falls evaluation completed - - - -   Functional Status Survey:    Vitals:   12/22/20 1428  BP: (!) 127/91  Pulse: 82  Resp: (!) 22  Temp: 98.5 F (36.9 C)  SpO2: 96%  Weight: 113 lb (51.3 kg)  Height: _0  (1.6 m)   Body mass index is 20.02 kg/m. Physical Exam Vitals reviewed.  Constitutional:  Appearance: Normal appearance.  HENT:     Head: Normocephalic and atraumatic.     Nose: Nose normal.     Mouth/Throat:     Mouth: Mucous membranes are moist.  Eyes:     Extraocular Movements: Extraocular movements intact.     Conjunctiva/sclera: Conjunctivae normal.     Pupils: Pupils are equal, round, and reactive to light.  Cardiovascular:     Rate and Rhythm: Normal rate and regular rhythm.     Heart sounds: No murmur heard. Pulmonary:     Breath sounds: No rales.  Abdominal:     General: Bowel sounds are normal.     Palpations: Abdomen is soft.     Tenderness: no abdominal tenderness  Musculoskeletal:     Cervical back: Normal range of motion and neck supple.     Right lower leg: Edema present.     Left lower leg: Edema present.     Comments: Cane walking. Edema trace to 1+ BLE  Skin:    General: Skin is warm and dry.     Findings: Rash present. No erythema.     Comments: Chronic pigmented venous insufficiency skin changes BLE. Itching raises red papules arms, itching satellite patterned rash under R+L breast.   Neurological:     Mental Status: She is alert. Mental status is at baseline.     Gait: Gait abnormal.     Comments: Oriented to person, place.   Psychiatric:        Mood and Affect: Mood normal.        Behavior: Behavior normal.        Thought Content: Thought content normal.        Judgment: Judgment normal.    Labs reviewed: Recent Labs    11/20/20 0730 12/08/20 0828  NA 138 136  K 4.2 4.6  CL 102 102  CO2 26 22  GLUCOSE 78 79   BUN 44* 42*  CREATININE 1.89* 1.92*  CALCIUM 10.4 10.3   Recent Labs    11/20/20 0730 12/08/20 0828  AST 20 21  ALT 15 12  BILITOT 0.5 0.4  PROT 6.7 6.7   Recent Labs    11/20/20 0730 12/08/20 0828  WBC 7.3 6.5  NEUTROABS 3,614 3,244  HGB 9.0* 8.6*  HCT 27.1* 26.6*  MCV 96.1 98.5  PLT 228 180   Lab Results  Component Value Date   TSH 2.48 11/20/2020   No results found for: HGBA1C Lab Results  Component Value Date   CHOL 159 08/16/2017   HDL 73 08/16/2017   LDLCALC 73 08/16/2017   TRIG 53 08/16/2017   CHOLHDL 2.2 08/16/2017    Significant Diagnostic Results in last 30 days:  DG Tibia/Fibula Left  Result Date: 12/24/2020 CLINICAL DATA:  Skin tear posterior left leg.  Status post fall. EXAM: LEFT TIBIA AND FIBULA - 2 VIEW COMPARISON:  None. FINDINGS: Bones are diffusely demineralized. No evidence for an acute fracture. Defect in the posterior soft tissues of the medial leg is compatible with the reported clinical history. No evidence for retained radiopaque soft tissue foreign body. IMPRESSION: Soft tissue injury without evidence for underlying bony abnormality or retained radiopaque soft tissue foreign body. Electronically Signed   By: Misty Stanley M.D.   On: 12/24/2020 12:11   DG Ankle Complete Right  Result Date: 12/24/2020 CLINICAL DATA:  Fall with pain and deformity. EXAM: RIGHT ANKLE - COMPLETE 3+ VIEW COMPARISON:  None. FINDINGS: Acute transverse fracture of the medial malleolus. Nondisplaced fracture  of the distal fibula. No definite fracture of the posterior lip of the tibia. IMPRESSION: Fracture of the medial malleolus. Nondisplaced fracture of the distal fibula. I think the posterior lip of the tibia is intact. No widening of the ankle mortise. Electronically Signed   By: Nelson Chimes M.D.   On: 12/24/2020 12:20    Assessment/Plan Rash A few red raised spots on arms, itching, may be bug bites or poison oaks? Under the breast R+L satellite patterned rash  represent yeast infection related to antibiotic use. Will apply Hydrocortisone cream for all itching areas, apply Nystatin powder to under breast itching rash until healed.   Cellulitis of left lower leg Cellulitis left lower leg, improved, 12/19/20 venous US LLE negative DVT  Anemia Hgb 8.3 12/11/20 8.4 12/18/20 Omeprazole started for GI protection, FOBT negative x3, normal Fe, Vit B12, Folate. Added low dose Fe for trial.  Update CBC/diff.  12/23/20 wbc 6.7, Hgb 9.2, plt 309, neutrophils 52.5  CKD (chronic kidney disease) stage 3, GFR 30-59 ml/min (HCC) CKD Bun/creat 40/1.67, eGFR 27 12/11/20, then 29/1.89 eGFR 23 after Furosemide resumed. Update CMP/eGFR, may consider Nephrology evaluation if desires.  Improving, Bun/creat 41/1.73, eGFR 28 12/23/20  Venous insufficiency (chronic) (peripheral) Improved, continue low dose Furosemide.   Incontinent of urine Her urinary frequency/leakage, uses pads, it seems better since increased Myrbetriq per Urology  Hypertension blood pressure is controlled, on Valsartan 174m qd.  Slow transit constipation Stable, diet and Fibercon     Family/ staff Communication: plan of care reviewed with the patient, the patient's daughter, and charge nurse.   Labs/tests ordered:  CBC/diff, CMP/eGFR  Time spend 40 minutes.

## 2020-12-23 ENCOUNTER — Encounter: Payer: Self-pay | Admitting: Nurse Practitioner

## 2020-12-23 DIAGNOSIS — R21 Rash and other nonspecific skin eruption: Secondary | ICD-10-CM | POA: Insufficient documentation

## 2020-12-23 DIAGNOSIS — E860 Dehydration: Secondary | ICD-10-CM | POA: Diagnosis not present

## 2020-12-23 DIAGNOSIS — Z85068 Personal history of other malignant neoplasm of small intestine: Secondary | ICD-10-CM | POA: Diagnosis not present

## 2020-12-23 DIAGNOSIS — I10 Essential (primary) hypertension: Secondary | ICD-10-CM | POA: Diagnosis not present

## 2020-12-23 DIAGNOSIS — L0390 Cellulitis, unspecified: Secondary | ICD-10-CM | POA: Diagnosis not present

## 2020-12-23 DIAGNOSIS — L570 Actinic keratosis: Secondary | ICD-10-CM | POA: Diagnosis not present

## 2020-12-23 LAB — CBC AND DIFFERENTIAL
HCT: 27 — AB (ref 36–46)
Hemoglobin: 9.2 — AB (ref 12.0–16.0)
Neutrophils Absolute: 3518
Platelets: 309 (ref 150–399)
WBC: 6.7

## 2020-12-23 LAB — COMPREHENSIVE METABOLIC PANEL
Albumin: 4.2 (ref 3.5–5.0)
Calcium: 9.8 (ref 8.7–10.7)
Globulin: 3.1

## 2020-12-23 LAB — BASIC METABOLIC PANEL
BUN: 41 — AB (ref 4–21)
CO2: 20 (ref 13–22)
Chloride: 101 (ref 99–108)
Creatinine: 1.7 — AB (ref 0.5–1.1)
Glucose: 67
Potassium: 5.2 (ref 3.4–5.3)
Sodium: 136 — AB (ref 137–147)

## 2020-12-23 LAB — HEPATIC FUNCTION PANEL
ALT: 13 (ref 7–35)
AST: 26 (ref 13–35)
Alkaline Phosphatase: 57 (ref 25–125)
Bilirubin, Total: 0.5

## 2020-12-23 LAB — CBC: RBC: 2.81 — AB (ref 3.87–5.11)

## 2020-12-23 NOTE — Assessment & Plan Note (Signed)
blood pressure is controlled, on Valsartan 160mg  qd.

## 2020-12-23 NOTE — Assessment & Plan Note (Signed)
A few red raised spots on arms, itching, may be bug bites or poison oaks? Under the breast R+L satellite patterned rash represent yeast infection related to antibiotic use. Will apply Hydrocortisone cream for all itching areas, apply Nystatin powder to under breast itching rash until healed.

## 2020-12-23 NOTE — Assessment & Plan Note (Addendum)
Hgb 8.3 12/11/20 8.4 12/18/20 Omeprazole started for GI protection, FOBT negative x3, normal Fe, Vit B12, Folate. Added low dose Fe for trial.  Update CBC/diff.  12/23/20 wbc 6.7, Hgb 9.2, plt 309, neutrophils 52.5

## 2020-12-23 NOTE — Assessment & Plan Note (Addendum)
CKD Bun/creat 40/1.67, eGFR 27 12/11/20, then 29/1.89 eGFR 23 after Furosemide resumed. Update CMP/eGFR, may consider Nephrology evaluation if desires.  Improving, Bun/creat 41/1.73, eGFR 28 12/23/20

## 2020-12-23 NOTE — Assessment & Plan Note (Signed)
Her urinary frequency/leakage, uses pads, it seems better since increased Myrbetriq per Urology

## 2020-12-23 NOTE — Assessment & Plan Note (Signed)
Improved, continue low dose Furosemide.

## 2020-12-23 NOTE — Assessment & Plan Note (Signed)
Stable, diet and Fibercon

## 2020-12-23 NOTE — Assessment & Plan Note (Signed)
Cellulitis left lower leg, improved, 12/19/20 venous US LLE negative DVT

## 2020-12-24 ENCOUNTER — Emergency Department (HOSPITAL_COMMUNITY): Payer: Medicare Other

## 2020-12-24 ENCOUNTER — Emergency Department (HOSPITAL_COMMUNITY)
Admission: EM | Admit: 2020-12-24 | Discharge: 2020-12-24 | Disposition: A | Discharge status: Home / Self Care | Payer: Medicare Other | Attending: Emergency Medicine | Admitting: Emergency Medicine

## 2020-12-24 ENCOUNTER — Other Ambulatory Visit: Payer: Self-pay

## 2020-12-24 ENCOUNTER — Encounter (HOSPITAL_COMMUNITY): Payer: Self-pay | Admitting: Emergency Medicine

## 2020-12-24 DIAGNOSIS — W19XXXA Unspecified fall, initial encounter: Secondary | ICD-10-CM | POA: Insufficient documentation

## 2020-12-24 DIAGNOSIS — S8251XA Displaced fracture of medial malleolus of right tibia, initial encounter for closed fracture: Secondary | ICD-10-CM | POA: Insufficient documentation

## 2020-12-24 DIAGNOSIS — Z87891 Personal history of nicotine dependence: Secondary | ICD-10-CM | POA: Insufficient documentation

## 2020-12-24 DIAGNOSIS — S82891A Other fracture of right lower leg, initial encounter for closed fracture: Secondary | ICD-10-CM

## 2020-12-24 DIAGNOSIS — S8254XA Nondisplaced fracture of medial malleolus of right tibia, initial encounter for closed fracture: Secondary | ICD-10-CM | POA: Diagnosis not present

## 2020-12-24 DIAGNOSIS — N183 Chronic kidney disease, stage 3 unspecified: Secondary | ICD-10-CM | POA: Insufficient documentation

## 2020-12-24 DIAGNOSIS — I10 Essential (primary) hypertension: Secondary | ICD-10-CM | POA: Diagnosis not present

## 2020-12-24 DIAGNOSIS — Z79899 Other long term (current) drug therapy: Secondary | ICD-10-CM | POA: Diagnosis not present

## 2020-12-24 DIAGNOSIS — I129 Hypertensive chronic kidney disease with stage 1 through stage 4 chronic kidney disease, or unspecified chronic kidney disease: Secondary | ICD-10-CM | POA: Insufficient documentation

## 2020-12-24 DIAGNOSIS — S99911A Unspecified injury of right ankle, initial encounter: Secondary | ICD-10-CM | POA: Diagnosis present

## 2020-12-24 DIAGNOSIS — S81812A Laceration without foreign body, left lower leg, initial encounter: Secondary | ICD-10-CM

## 2020-12-24 MED ORDER — FENTANYL CITRATE (PF) 100 MCG/2ML IJ SOLN
50.0000 ug | Freq: Once | INTRAMUSCULAR | Status: AC
  Administered 2020-12-24: 50 ug via INTRAVENOUS
  Filled 2020-12-24: qty 2

## 2020-12-24 MED ORDER — DOXYCYCLINE HYCLATE 100 MG PO CAPS: 100.0000 mg | ORAL_CAPSULE | Freq: Two times a day (BID) | ORAL | 0 refills | Status: AC

## 2020-12-24 NOTE — ED Notes (Signed)
Ortho Tech called to apply splint.

## 2020-12-24 NOTE — ED Notes (Signed)
Attempted to call report to [facility] with no success.

## 2020-12-24 NOTE — ED Notes (Addendum)
Due to pt being 11th on the PTAR list, pt's daughter recommended just taking the pt to [facility] herself. D/C instructions reviewed with pt and the pt's daughter. Both verbalized understanding.

## 2020-12-24 NOTE — Progress Notes (Signed)
Orthopedic Tech Progress Note Patient Details:  Peggy [name] [birth year] [id number]  Ortho Devices Type of Ortho Device: Post (short leg) splint, Stirrup splint Ortho Device/Splint Location: Right Leg Ortho Device/Splint Interventions: Application   Post Interventions Patient Tolerated: Well Instructions Provided: Care of device  Bronson Bressman E Cellie Dardis 12/24/2020, 3:03 PM

## 2020-12-24 NOTE — ED Notes (Addendum)
Pt placed on purewick 

## 2020-12-24 NOTE — ED Provider Notes (Signed)
[facility] DEPT Provider Note   CSN: [id number] Arrival date & time: 12/24/20  6237     History Chief Complaint  Patient presents with   Lytle Michaels    Peggy [name] is a 87 y.o. female.  The history is provided by the patient, a relative and medical records. No language interpreter was used.  Fall This is a new problem. The current episode started 3 to 5 hours ago. The problem occurs rarely. The problem has not changed since onset.Pertinent negatives include no chest pain, no abdominal pain, no headaches and no shortness of breath. The symptoms are aggravated by walking. Nothing relieves the symptoms. She has tried nothing for the symptoms. The treatment provided no relief.      Past Medical History:  Diagnosis Date   Hypertension     Patient Active Problem List   Diagnosis Date Noted   Rash 12/23/2020   Cellulitis of left lower leg 12/16/2020   Osteoporosis 11/28/2020   Slow transit constipation 02/21/2020   Gait abnormality 08/31/2018   Fecal incontinence 08/31/2018   Loose stools 08/31/2018   Right shoulder pain 03/30/2018   Right patella fracture 11/10/2017   Anemia 11/10/2017   CKD (chronic kidney disease) stage 3, GFR 30-59 ml/min (HCC) 10/11/2017   Venous insufficiency (chronic) (peripheral) 08/10/2017   Incontinent of urine 08/10/2017   Malnutrition of moderate degree 05/19/2016   Hypertension 05/18/2016    History reviewed. No pertinent surgical history.   OB History   No obstetric history on file.     Family History  Problem Relation Age of Onset   Heart failure Mother    Heart disease Father    Arthritis Sister     Social History   Tobacco Use   Smoking status: Former    Years: 12.00    Pack years: 0.00    Types: Cigarettes   Smokeless tobacco: Never  Vaping Use   Vaping Use: Never used  Substance Use Topics   Alcohol use: Not Currently   Drug use: No    Home Medications Prior to Admission medications    Medication Sig Start Date End Date Taking? Authorizing Provider  alendronate (FOSAMAX) 70 MG tablet Take 1 tablet (70 mg total) by mouth every 7 (seven) days. Take with a full glass of water on an empty stomach. 12/09/20 12/09/21  Mast, Man X, NP  Calcium Carb-Cholecalciferol 500-400 MG-UNIT TABS Take 1 tablet by mouth daily.    [provider]  cholecalciferol (VITAMIN D) 1000 units tablet Take 2,000 Units by mouth daily.    [provider]  doxycycline (VIBRA-TABS) 100 MG tablet Take 100 mg by mouth 2 (two) times daily. 12/16/20   [provider]  ferrous sulfate 325 (65 FE) MG tablet Take 325 mg by mouth daily with breakfast. MON, WED, FRI    [provider]  furosemide (LASIX) 20 MG tablet Take 20 mg by mouth daily. 12/18/20   [provider]  hydrocortisone cream 1 % Apply 1 application topically 2 (two) times daily. 12/16/20   [provider]  mirabegron ER (MYRBETRIQ) 50 MG TB24 tablet Take 50 mg by mouth daily.    [provider]  Multiple Vitamins-Minerals (ICAPS AREDS 2 PO) Take by mouth 2 (two) times daily.    [provider]  omeprazole (PRILOSEC) 20 MG capsule Take 20 mg by mouth daily. 12/10/20   [provider]  polycarbophil (FIBERCON) 625 MG tablet Take 625 mg by mouth as needed.  [provider]  potassium chloride SA (KLOR-CON) 20 MEQ tablet Take 20 mEq by mouth daily. 12/16/20   [provider]  valsartan (DIOVAN) 160 MG tablet TAKE 1 TABLET BY MOUTH  DAILY 12/20/19   Mast, Man X, NP  vitamin C (ASCORBIC ACID) 250 MG tablet Take 250 mg by mouth daily.    [provider]    Allergies    Ace inhibitors, Flagyl [metronidazole], and Penicillins  Review of Systems   Review of Systems  Constitutional:  Negative for chills, diaphoresis, fatigue and fever.  HENT:  Negative for congestion.   Respiratory:  Negative for cough, chest tightness and shortness of breath.    Cardiovascular:  Negative for chest pain.  Gastrointestinal:  Negative for abdominal pain, constipation, diarrhea, nausea and vomiting.  Genitourinary:  Negative for flank pain.  Musculoskeletal:  Negative for back pain and neck pain.  Skin:  Positive for wound.  Neurological:  Negative for weakness, light-headedness, numbness and headaches.  Psychiatric/Behavioral:  Negative for agitation and confusion.   All other systems reviewed and are negative.  Physical Exam Updated Vital Signs BP (!) 198/96 (BP Location: Right Arm)   Pulse 69   Temp 98.4 F (36.9 C) (Oral)   Resp 15   Ht 5\' 3"  (1.6 m)   Wt 51.3 kg   SpO2 95%   BMI 20.02 kg/m   Physical Exam Vitals and nursing note reviewed.  Constitutional:      General: She is not in acute distress.    Appearance: She is well-developed. She is not ill-appearing or toxic-appearing.  HENT:     Head: Normocephalic and atraumatic.     Nose: Nose normal.  Eyes:     Conjunctiva/sclera: Conjunctivae normal.  Cardiovascular:     Rate and Rhythm: Normal rate and regular rhythm.     Heart sounds: No murmur heard. Pulmonary:     Effort: Pulmonary effort is normal. No respiratory distress.     Breath sounds: Normal breath sounds. No wheezing, rhonchi or rales.  Chest:     Chest wall: No tenderness.  Abdominal:     General: Abdomen is flat.     Palpations: Abdomen is soft.     Tenderness: There is no abdominal tenderness. There is no guarding or rebound.     Hernia: No hernia is present.  Musculoskeletal:        General: Swelling, tenderness, deformity and signs of injury present.     Cervical back: Neck supple. No tenderness.     Right ankle: Swelling and deformity present. No lacerations. Tenderness present. No medial malleolus tenderness.     Left ankle: Laceration present. No deformity.     Comments: Swelling and tenderness to the right ankle with pain with movement.  Intact DP and PT pulse upper.  Intact sensation, cap refill, and  strength of the toes.  Skin tear to the left shin with minimal tenderness.  Hemostatic.  Intact DP and PT pulse distally.  Intact sensation and strength distal to that injury.  No other tenderness present.  Mild bruise to the right elbow that is nontender.  Old bruise to the left elbow that is nontender.  Skin:    General: Skin is warm and dry.     Capillary Refill: Capillary refill takes less than 2 seconds.     Findings: No erythema.  Neurological:     General: No focal deficit present.     Mental Status: She is alert.     Sensory: No  sensory deficit.     Motor: No weakness.  Psychiatric:        Mood and Affect: Mood normal.    ED Results / Procedures / Treatments   Labs (all labs ordered are listed, but only abnormal results are displayed) Labs Reviewed - No data to display  EKG None  Radiology DG Tibia/Fibula Left  Result Date: 12/24/2020 CLINICAL DATA:  Skin tear posterior left leg.  Status post fall. EXAM: LEFT TIBIA AND FIBULA - 2 VIEW COMPARISON:  None. FINDINGS: Bones are diffusely demineralized. No evidence for an acute fracture. Defect in the posterior soft tissues of the medial leg is compatible with the reported clinical history. No evidence for retained radiopaque soft tissue foreign body. IMPRESSION: Soft tissue injury without evidence for underlying bony abnormality or retained radiopaque soft tissue foreign body. Electronically Signed   By: Misty Stanley M.D.   On: 12/24/2020 12:11   DG Ankle Complete Right  Result Date: 12/24/2020 CLINICAL DATA:  Fall with pain and deformity. EXAM: RIGHT ANKLE - COMPLETE 3+ VIEW COMPARISON:  None. FINDINGS: Acute transverse fracture of the medial malleolus. Nondisplaced fracture of the distal fibula. No definite fracture of the posterior lip of the tibia. IMPRESSION: Fracture of the medial malleolus. Nondisplaced fracture of the distal fibula. I think the posterior lip of the tibia is intact. No widening of the ankle mortise.  Electronically Signed   By: Nelson Chimes M.D.   On: 12/24/2020 12:20    Procedures Procedures   Medications Ordered in ED Medications  fentaNYL (SUBLIMAZE) injection 50 mcg (50 mcg Intravenous Given 12/24/20 1039)    ED Course  I have reviewed the triage vital signs and the nursing notes.  Pertinent labs & imaging results that were available during my care of the patient were reviewed by me and considered in my medical decision making (see chart for details).    MDM Rules/Calculators/A&P                          WALLACE COGLIANO is a 87 y.o. female with a past medical history significant for hypertension, osteoporosis, CKD, and recent cellulitis of left leg who presents for fall.  According to patient, she was using her walker leaving her room today when she was turning around to go back and get something when she had a very mechanical fall.  She did not hit her head and denies any headache, neck pain, or pain in her torso but sustained injury to her right ankle and it was bent in a awkward position initially as well sustaining  large skin tear to the left shin.  Patient had addressed by EMS and was brought in.  Patient is complaining of 8 out of 10 pain in her right ankle but has minimal pain in the left leg.  She does note that she recently finished antibiotics yesterday for cellulitis of the left leg near where the skin tear is located.  She reports no other complaints and otherwise no preceding symptoms such as fevers, chills, cough, chest pain or shortness of breath, nausea, vomiting, urinary symptoms or GI symptoms.  She is only complaining about the injuries from the fall.  On exam, patient does have tenderness and swelling of the right ankle.  There is discoloration just proximally which family reports is chronic for her due to circulation troubles.  She has intact DP and PT pulse on the right however and has intact sensation and strength of  the toes.  Normal capillary refill.  No  tenderness of the knee or shin.  Hip nontender.  Left side has a large L-shaped skin tear on her left shin that is now hemostatic.  Otherwise intact sensation, strength, and pulse distally.  Minimal tenderness near the site.  No knee tenderness or hip tenderness.  No focal neurologic deficits and exam otherwise unremarkable.  Lungs clear and chest nontender.  She does have some bruising on both elbows when the left being from recent blood draw and right being from the fall.  She denies any pain or tenderness and does not want imaging of the elbow.  Had a shared decision conversation with patient and family about work-up.  As this was a mechanical fall, family is not inclined to do extensive lab or imaging work-up from the knees up given her lack of preceding complaints.  They agree to hold on CT head, imaging of torso, or any labs.  They do however agree with x-ray of the right ankle and the left shin and then dressing the wound.  As the family agrees to look for a skin tear, they do not feel that stitches are going to be possible on it and I tend to agree looking at it.  We will watch it and have it dressed after.  Chart review does show that her tetanus is up-to-date.  Will await imaging to discuss disposition and management.   X-ray returned showing evidence of ankle fracture.  I spoke with orthopedics and Dr. Lucia Gaskins who feels that a posterior short leg splint with stirrups is appropriate and he will see her in clinic this week.  She has a walker to use at her facility and the daughter feels that they should be able to increase her care to let her go back there today.  Patient's other wound was dressed and patient will follow-up with PCP for the skin tear management.  As we discussed, we will reinitiate antibiotics for the next few days to prevent infection on the area that has the skin tear where the just was completing cellulitis management.  Patient and family agreed with plan of care and patient  discharged in good condition.   Final Clinical Impression(s) / ED Diagnoses Final diagnoses:  Skin tear of left lower leg without complication, initial encounter  Ankle fracture, right, closed, initial encounter    Rx / DC Orders ED Discharge Orders          Ordered    doxycycline (VIBRAMYCIN) 100 MG capsule  2 times daily        12/24/20 1436           Clinical Impression: 1. Skin tear of left lower leg without complication, initial encounter   2. Ankle fracture, right, closed, initial encounter     Disposition: Discharge  Condition: Good  I have discussed the results, Dx and Tx plan with the pt(& family if present). He/she/they expressed understanding and agree(s) with the plan. Discharge instructions discussed at great length. Strict return precautions discussed and pt &/or family have verbalized understanding of the instructions. No further questions at time of discharge.    New Prescriptions   DOXYCYCLINE (VIBRAMYCIN) 100 MG CAPSULE    Take 1 capsule (100 mg total) by mouth 2 (two) times daily for 7 days.    Follow Up: Erle Crocker, MD [place] [id number] [phone]     [facility] [place] [id number] [address]  [place]       Falesha Schommer, Gwenyth Allegra, MD 12/24/20 [phone]
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
## 2020-12-24 NOTE — ED Notes (Signed)
[facility] called for transport to [facility].

## 2020-12-24 NOTE — Discharge Instructions (Addendum)
Your history, exam, work-up today are consistent with an ankle fracture on the right ankle and a skin tear to the left shin.  The x-ray confirmed the ankle fracture and we splinted as per Dr. Lucia Gaskins with orthopedics.  Please see him in clinic this week to discuss further management.  For the skin tear, as you just completed antibiotics within the last 24 hours of cellulitis at this location, we will extend your antibiotics for the next week.  Please watch for signs and symptoms of  worsening infection.  Please follow-up with your primary doctor for wound management and if any symptoms change or worsen acutely, please return to the nearest emergency department.  Please be careful not to fall again.

## 2020-12-24 NOTE — ED Notes (Signed)
Skin tear cleaned with mepitel dressing applied and wrapped with curlex.

## 2020-12-24 NOTE — ED Triage Notes (Addendum)
Pt BIBA from [facility] with c/o unwitnessed fall. EMS reports pt has a laceration to the left calf and has discoloration and deformity to the right ankle. Reports pain 8/10.   180/100 72 95% room  CBG -102

## 2020-12-25 ENCOUNTER — Non-Acute Institutional Stay: Payer: Medicare Other | Admitting: Nurse Practitioner

## 2020-12-25 ENCOUNTER — Encounter: Payer: Self-pay | Admitting: Nurse Practitioner

## 2020-12-25 ENCOUNTER — Telehealth: Payer: Self-pay | Admitting: *Deleted

## 2020-12-25 DIAGNOSIS — D649 Anemia, unspecified: Secondary | ICD-10-CM | POA: Diagnosis not present

## 2020-12-25 DIAGNOSIS — S82891D Other fracture of right lower leg, subsequent encounter for closed fracture with routine healing: Secondary | ICD-10-CM | POA: Diagnosis not present

## 2020-12-25 DIAGNOSIS — R21 Rash and other nonspecific skin eruption: Secondary | ICD-10-CM

## 2020-12-25 DIAGNOSIS — K5901 Slow transit constipation: Secondary | ICD-10-CM

## 2020-12-25 DIAGNOSIS — N1831 Chronic kidney disease, stage 3a: Secondary | ICD-10-CM

## 2020-12-25 DIAGNOSIS — S82891A Other fracture of right lower leg, initial encounter for closed fracture: Secondary | ICD-10-CM | POA: Insufficient documentation

## 2020-12-25 DIAGNOSIS — N3942 Incontinence without sensory awareness: Secondary | ICD-10-CM

## 2020-12-25 DIAGNOSIS — I10 Essential (primary) hypertension: Secondary | ICD-10-CM

## 2020-12-25 DIAGNOSIS — S81812D Laceration without foreign body, left lower leg, subsequent encounter: Secondary | ICD-10-CM

## 2020-12-25 NOTE — Assessment & Plan Note (Signed)
Left calf skin tear, placed steri strips at [facility], no s/s infection, on Doxy for infect prophylaxis.

## 2020-12-25 NOTE — Assessment & Plan Note (Addendum)
R ankle fracture ( Fracture of the medial malleolus. Nondisplaced fracture of the distal fibula. I think the posterior lip of the tibia is intact. No widening of the ankle mortise.)  sustained from a mechanical fall at [facility], placed a posterior short leg spline with stirups per Ortho, f/u 12/26/20.  [facility] for care needs and safety.

## 2020-12-25 NOTE — Assessment & Plan Note (Signed)
Bun/creat 41/1.73 eGFR 28 12/23/20, may refer to nephrology is desires.

## 2020-12-25 NOTE — Assessment & Plan Note (Signed)
Under breast itching rash, improving on Hydrocortisone, Nystatin cream

## 2020-12-25 NOTE — Telephone Encounter (Signed)
Peggy [name], daughter, [phone] called and stated that patient fell yesterday and fractured her ankle.   Daughter is wanting you to speak with the [facility] so they will give her Tylenol for the pain.   Also is wondering if you could place an order with the facility for patient to be moved to [facility] due to non Weight bearing.

## 2020-12-25 NOTE — Assessment & Plan Note (Signed)
Her urinary frequency/leakage, uses pads, it seems better since increased Myrbetriq per Urology

## 2020-12-25 NOTE — Progress Notes (Signed)
Location:   [facility] Room Number: [place] of Service:  ALF [phone]) Provider:  Radhika Dershem X Sigmond Patalano, NP  Fadel Clason X, NP  Patient Care Team: Shazia Mitchener X, NP as PCP - General (Internal Medicine)  Extended Emergency Contact Information Primary Emergency Contact: [place] Phone: [phone] Work Phone: [phone] Relation: None Secondary Emergency Contact: E[place],[name] Address: [place], [place] [address] [name] of [place] Phone: [phone] Work Phone: [phone] Mobile Phone: [phone] Relation: Daughter  Code Status:  DNR Goals of care: Advanced Directive information Advanced Directives 12/25/2020  Does Patient Have a Medical Advance Directive? Yes  Type of Paramedic of [place];Out of facility DNR (pink MOST or yellow form)  Does patient want to make changes to medical advance directive? No - Patient declined  Copy of [place] in Chart? Yes - validated most recent copy scanned in chart (See row information)  Pre-existing out of facility DNR order (yellow form or pink MOST form) Yellow form placed in chart (order not valid for inpatient use);Pink MOST form placed in chart (order not valid for inpatient use)     Chief Complaint  Patient presents with   Acute Visit    Patient evaluation for concerns of needing possible higher level of care    HPI:  Pt is a 87 y.o. female seen today for an acute visit for ED eval 12/23/20 for R ankle fracture ( Fracture of the medial malleolus. Nondisplaced fracture of the distal fibula. I think the posterior lip of the tibia is intact. No widening of the ankle mortise.)  sustained from a mechanical fall at [facility], placed a posterior short leg spline with stirups per Ortho, f/u 12/26/20. Left calf skin tear, placed steri strips at [facility], no s/s infection, on Doxy for infect prophylaxis.   Under breast itching rash,  improving on Hydrocortisone, Nystatin cream             Cellulitis left lower leg, resolved, 12/19/20 venous US LLE negative DVT             Anemia, Hgb 8.3 12/11/20 8.4 12/18/20 >>9.2 12/23/20, Omeprazole started for GI protection, FOBT negative x3, normal Fe, Vit B12, Folate. Added low dose Fe for trial. Edema BLE, improved on Furosemide CKD Bun/creat 41/1.73 eGFR 28 12/23/20             Her urinary frequency/leakage, uses pads, it seems better since increased Myrbetriq per Urology             HTN, blood pressure is controlled, on Valsartan 16m qd.             No constipation, taking Fibercon prn.             Past Medical History:  Diagnosis Date   Hypertension    History reviewed. No pertinent surgical history.  Allergies  Allergen Reactions   Ace Inhibitors    Flagyl [Metronidazole]    Penicillins Swelling    facial    Allergies as of 12/25/2020       Reactions   Ace Inhibitors    Flagyl [metronidazole]    Penicillins Swelling   facial        Medication List        Accurate as of December 25, 2020  2:13 PM. If you have any questions, ask your nurse or doctor.  acetaminophen 325 MG tablet Commonly known as: TYLENOL Take 650 mg by mouth every 4 (four) hours as needed.   alendronate 70 MG tablet Commonly known as: Fosamax Take 1 tablet (70 mg total) by mouth every 7 (seven) days. Take with a full glass of water on an empty stomach.   Calcium Carb-Cholecalciferol 500-400 MG-UNIT Tabs Take 1 tablet by mouth daily.   cholecalciferol 25 MCG (1000 UNIT) tablet Commonly known as: VITAMIN D Take 2,000 Units by mouth daily.   doxycycline 100 MG capsule Commonly known as: VIBRAMYCIN Take 1 capsule (100 mg total) by mouth 2 (two) times daily for 7 days. What changed: Another medication with the same name was removed. Continue taking this medication, and follow the directions you see here. Changed by:  X , NP   ferrous sulfate 325 (65 FE) MG tablet Take  325 mg by mouth daily with breakfast. MON, WED, FRI   furosemide 20 MG tablet Commonly known as: LASIX Take 20 mg by mouth daily.   hydrocortisone cream 1 % Apply 1 application topically 2 (two) times daily.   ICAPS AREDS 2 PO Take by mouth 2 (two) times daily.   mirabegron ER 50 MG Tb24 tablet Commonly known as: MYRBETRIQ Take 50 mg by mouth daily.   nystatin powder Commonly known as: MYCOSTATIN/NYSTOP Apply 1 application topically 2 (two) times daily.   omeprazole 20 MG capsule Commonly known as: PRILOSEC Take 20 mg by mouth daily.   polycarbophil 625 MG tablet Commonly known as: FIBERCON Take 625 mg by mouth as needed.   potassium chloride SA 20 MEQ tablet Commonly known as: KLOR-CON Take 20 mEq by mouth daily.   valsartan 160 MG tablet Commonly known as: DIOVAN TAKE 1 TABLET BY MOUTH  DAILY   vitamin C 250 MG tablet Commonly known as: ASCORBIC ACID Take 250 mg by mouth daily.        Review of Systems  Constitutional:  Negative for activity change, appetite change and fever.  HENT:  Positive for hearing loss. Negative for congestion and voice change.   Eyes:  Negative for visual disturbance.  Respiratory:  Negative for cough and shortness of breath.   Cardiovascular:  Positive for leg swelling.  Gastrointestinal:  Negative for abdominal pain and constipation.       No indigestion, upset stomach.   Genitourinary:  Positive for frequency. Negative for dysuria and urgency.       Occasionally incontinent of urine. Urination average every 4 hrs during day 1x/night.   Musculoskeletal:  Positive for gait problem.       Right lower leg/ankle in the splint.   Skin:  Positive for wound. Negative for color change.       Left calf skin tear  Neurological:  Negative for speech difficulty, weakness and light-headedness.       Memory lapses.   Psychiatric/Behavioral:  Negative for behavioral problems and sleep disturbance. The patient is not nervous/anxious.     Immunization History  Administered Date(s) Administered   Fluad Quad(high Dose 65+) 03/28/2019   H1N1 03/18/2009   Influenza, High Dose Seasonal PF 03/25/2017, 03/16/2018   Influenza-Unspecified 02/25/2011, 03/31/2012, 03/27/2013, 03/14/2014, 03/25/2016, 06/14/2017   Moderna SARS-COV2 Booster Vaccination 10/28/2020   Moderna Sars-Covid-2 Vaccination 06/18/2019, 07/16/2019   Pneumococcal Conjugate-13 09/10/2014   Pneumococcal Polysaccharide-23 11/03/2014   Pneumococcal-Unspecified 06/14/2017   Tdap 11/27/2019   Pertinent  Health Maintenance Due  Topic Date Due   INFLUENZA VACCINE  01/12/2021   DEXA SCAN  Completed  PNA vac Low Risk Adult  Completed   Fall Risk  12/04/2020 05/30/2020 11/23/2019 08/16/2019 05/03/2019  Falls in the past year? 0 0 0 0 0  Number falls in past yr: 0 0 0 0 0  Injury with Fall? 0 - - - -  Risk for fall due to : No Fall Risks - - - -  Follow up Falls evaluation completed - - - -   Functional Status Survey:    Vitals:   12/25/20 1042  BP: 129/87  Pulse: 95  Resp: 20  Temp: 98.4 F (36.9 C)  SpO2: 97%  Weight: 113 lb (51.3 kg)  Height: 5' 3" (1.6 m)   Body mass index is 20.02 kg/m. Physical Exam Vitals reviewed.  Constitutional:      Appearance: Normal appearance.  HENT:     Head: Normocephalic and atraumatic.     Nose: Nose normal.     Mouth/Throat:     Mouth: Mucous membranes are moist.  Eyes:     Extraocular Movements: Extraocular movements intact.     Conjunctiva/sclera: Conjunctivae normal.     Pupils: Pupils are equal, round, and reactive to light.  Cardiovascular:     Rate and Rhythm: Normal rate and regular rhythm.     Heart sounds: No murmur heard. Pulmonary:     Breath sounds: No rales.  Abdominal:     General: Bowel sounds are normal.     Palpations: Abdomen is soft.     Tenderness: There is no abdominal tenderness.  Musculoskeletal:        General: Tenderness present.     Cervical back: Normal range of motion and  neck supple.     Right lower leg: Edema present.     Left lower leg: Edema present.     Comments: Right ankle in splint, moderate swelling in foot/toes, able to wiggle toes, warm to touch, denied pain, tingling, or numbness  Skin:    General: Skin is warm and dry.     Comments: Chronic pigmented venous insufficiency skin changes BLE. Left calf skin tear, re closure made with steri strips, no s/s of infection.   Neurological:     Mental Status: She is alert. Mental status is at baseline.     Gait: Gait abnormal.     Comments: Oriented to person, place.   Psychiatric:        Mood and Affect: Mood normal.        Behavior: Behavior normal.        Thought Content: Thought content normal.        Judgment: Judgment normal.    Labs reviewed: Recent Labs    11/20/20 0730 12/08/20 0828  NA 138 136  K 4.2 4.6  CL 102 102  CO2 26 22  GLUCOSE 78 79  BUN 44* 42*  CREATININE 1.89* 1.92*  CALCIUM 10.4 10.3   Recent Labs    11/20/20 0730 12/08/20 0828  AST 20 21  ALT 15 12  BILITOT 0.5 0.4  PROT 6.7 6.7   Recent Labs    11/20/20 0730 12/08/20 0828  WBC 7.3 6.5  NEUTROABS 3,614 3,244  HGB 9.0* 8.6*  HCT 27.1* 26.6*  MCV 96.1 98.5  PLT 228 180   Lab Results  Component Value Date   TSH 2.48 11/20/2020   No results found for: HGBA1C Lab Results  Component Value Date   CHOL 159 08/16/2017   HDL 73 08/16/2017   LDLCALC 73 08/16/2017   TRIG 53 08/16/2017  CHOLHDL 2.2 08/16/2017    Significant Diagnostic Results in last 30 days:  DG Tibia/Fibula Left  Result Date: 12/24/2020 CLINICAL DATA:  Skin tear posterior left leg.  Status post fall. EXAM: LEFT TIBIA AND FIBULA - 2 VIEW COMPARISON:  None. FINDINGS: Bones are diffusely demineralized. No evidence for an acute fracture. Defect in the posterior soft tissues of the medial leg is compatible with the reported clinical history. No evidence for retained radiopaque soft tissue foreign body. IMPRESSION: Soft tissue injury  without evidence for underlying bony abnormality or retained radiopaque soft tissue foreign body. Electronically Signed   By: Misty Stanley M.D.   On: 12/24/2020 12:11   DG Ankle Complete Right  Result Date: 12/24/2020 CLINICAL DATA:  Fall with pain and deformity. EXAM: RIGHT ANKLE - COMPLETE 3+ VIEW COMPARISON:  None. FINDINGS: Acute transverse fracture of the medial malleolus. Nondisplaced fracture of the distal fibula. No definite fracture of the posterior lip of the tibia. IMPRESSION: Fracture of the medial malleolus. Nondisplaced fracture of the distal fibula. I think the posterior lip of the tibia is intact. No widening of the ankle mortise. Electronically Signed   By: Nelson Chimes M.D.   On: 12/24/2020 12:20    Assessment/Plan Closed right ankle fracture R ankle fracture ( Fracture of the medial malleolus. Nondisplaced fracture of the distal fibula. I think the posterior lip of the tibia is intact. No widening of the ankle mortise.)  sustained from a mechanical fall at [facility], placed a posterior short leg spline with stirups per Ortho, f/u 12/26/20.  [facility] for care needs and safety.   Noninfected skin tear of leg, left, subsequent encounter Left calf skin tear, placed steri strips at [facility], no s/s infection, on Doxy for infect prophylaxis.   Rash Under breast itching rash, improving on Hydrocortisone, Nystatin cream  Anemia Hgb 8.3 12/11/20 8.4 12/18/20 >>9.2 12/23/20, Omeprazole started for GI protection, FOBT negative x3, normal Fe, Vit B12, Folate. Added low dose Fe for trial.  CKD (chronic kidney disease) stage 3, GFR 30-59 ml/min (HCC) Bun/creat 41/1.73 eGFR 28 12/23/20, may refer to nephrology is desires.   Incontinent of urine Her urinary frequency/leakage, uses pads, it seems better since increased Myrbetriq per Urology  Hypertension blood pressure is controlled, on Valsartan 161m qd.  Slow transit constipation Stable,  taking Fibercon prn.     Family/ staff  Communication: plan of care reviewed with the patient, the patient's daughter, and charge nurse.   Labs/tests ordered:  none  Time spend 40 minutes.

## 2020-12-25 NOTE — Assessment & Plan Note (Signed)
blood pressure is controlled, on Valsartan 160mg  qd.

## 2020-12-25 NOTE — Assessment & Plan Note (Signed)
Hgb 8.3 12/11/20 8.4 12/18/20 >>9.2 12/23/20, Omeprazole started for GI protection, FOBT negative x3, normal Fe, Vit B12, Folate. Added low dose Fe for trial.

## 2020-12-25 NOTE — Assessment & Plan Note (Signed)
Stable,  taking Fibercon prn.

## 2020-12-26 DIAGNOSIS — S82841A Displaced bimalleolar fracture of right lower leg, initial encounter for closed fracture: Secondary | ICD-10-CM | POA: Diagnosis not present

## 2020-12-26 NOTE — Telephone Encounter (Signed)
Mast, Man X, NP  You 55 minutes ago (11:00 AM)   Done, thanks

## 2020-12-29 DIAGNOSIS — M6281 Muscle weakness (generalized): Secondary | ICD-10-CM | POA: Diagnosis not present

## 2020-12-29 DIAGNOSIS — S8254XD Nondisplaced fracture of medial malleolus of right tibia, subsequent encounter for closed fracture with routine healing: Secondary | ICD-10-CM | POA: Diagnosis not present

## 2020-12-29 DIAGNOSIS — K219 Gastro-esophageal reflux disease without esophagitis: Secondary | ICD-10-CM | POA: Diagnosis not present

## 2020-12-29 DIAGNOSIS — R2681 Unsteadiness on feet: Secondary | ICD-10-CM | POA: Diagnosis not present

## 2020-12-29 DIAGNOSIS — R41841 Cognitive communication deficit: Secondary | ICD-10-CM | POA: Diagnosis not present

## 2020-12-29 DIAGNOSIS — R29898 Other symptoms and signs involving the musculoskeletal system: Secondary | ICD-10-CM | POA: Diagnosis not present

## 2020-12-29 DIAGNOSIS — Z9181 History of falling: Secondary | ICD-10-CM | POA: Diagnosis not present

## 2020-12-30 ENCOUNTER — Other Ambulatory Visit: Payer: Self-pay | Admitting: Nurse Practitioner

## 2020-12-30 DIAGNOSIS — Z9181 History of falling: Secondary | ICD-10-CM | POA: Diagnosis not present

## 2020-12-30 DIAGNOSIS — M810 Age-related osteoporosis without current pathological fracture: Secondary | ICD-10-CM

## 2020-12-30 DIAGNOSIS — R2681 Unsteadiness on feet: Secondary | ICD-10-CM | POA: Diagnosis not present

## 2020-12-30 DIAGNOSIS — M6281 Muscle weakness (generalized): Secondary | ICD-10-CM | POA: Diagnosis not present

## 2020-12-30 DIAGNOSIS — R41841 Cognitive communication deficit: Secondary | ICD-10-CM | POA: Diagnosis not present

## 2020-12-30 DIAGNOSIS — R29898 Other symptoms and signs involving the musculoskeletal system: Secondary | ICD-10-CM | POA: Diagnosis not present

## 2020-12-30 DIAGNOSIS — S8254XD Nondisplaced fracture of medial malleolus of right tibia, subsequent encounter for closed fracture with routine healing: Secondary | ICD-10-CM | POA: Diagnosis not present

## 2020-12-31 DIAGNOSIS — R41841 Cognitive communication deficit: Secondary | ICD-10-CM | POA: Diagnosis not present

## 2020-12-31 DIAGNOSIS — R2681 Unsteadiness on feet: Secondary | ICD-10-CM | POA: Diagnosis not present

## 2020-12-31 DIAGNOSIS — S8254XD Nondisplaced fracture of medial malleolus of right tibia, subsequent encounter for closed fracture with routine healing: Secondary | ICD-10-CM | POA: Diagnosis not present

## 2020-12-31 DIAGNOSIS — M6281 Muscle weakness (generalized): Secondary | ICD-10-CM | POA: Diagnosis not present

## 2020-12-31 DIAGNOSIS — Z9181 History of falling: Secondary | ICD-10-CM | POA: Diagnosis not present

## 2020-12-31 DIAGNOSIS — R29898 Other symptoms and signs involving the musculoskeletal system: Secondary | ICD-10-CM | POA: Diagnosis not present

## 2021-01-02 DIAGNOSIS — R41841 Cognitive communication deficit: Secondary | ICD-10-CM | POA: Diagnosis not present

## 2021-01-02 DIAGNOSIS — R2681 Unsteadiness on feet: Secondary | ICD-10-CM | POA: Diagnosis not present

## 2021-01-02 DIAGNOSIS — M6281 Muscle weakness (generalized): Secondary | ICD-10-CM | POA: Diagnosis not present

## 2021-01-02 DIAGNOSIS — S8254XD Nondisplaced fracture of medial malleolus of right tibia, subsequent encounter for closed fracture with routine healing: Secondary | ICD-10-CM | POA: Diagnosis not present

## 2021-01-02 DIAGNOSIS — Z9181 History of falling: Secondary | ICD-10-CM | POA: Diagnosis not present

## 2021-01-02 DIAGNOSIS — R29898 Other symptoms and signs involving the musculoskeletal system: Secondary | ICD-10-CM | POA: Diagnosis not present

## 2021-01-03 DIAGNOSIS — R29898 Other symptoms and signs involving the musculoskeletal system: Secondary | ICD-10-CM | POA: Diagnosis not present

## 2021-01-03 DIAGNOSIS — M6281 Muscle weakness (generalized): Secondary | ICD-10-CM | POA: Diagnosis not present

## 2021-01-03 DIAGNOSIS — R2681 Unsteadiness on feet: Secondary | ICD-10-CM | POA: Diagnosis not present

## 2021-01-03 DIAGNOSIS — R41841 Cognitive communication deficit: Secondary | ICD-10-CM | POA: Diagnosis not present

## 2021-01-03 DIAGNOSIS — Z9181 History of falling: Secondary | ICD-10-CM | POA: Diagnosis not present

## 2021-01-03 DIAGNOSIS — S8254XD Nondisplaced fracture of medial malleolus of right tibia, subsequent encounter for closed fracture with routine healing: Secondary | ICD-10-CM | POA: Diagnosis not present

## 2021-01-04 DIAGNOSIS — R2681 Unsteadiness on feet: Secondary | ICD-10-CM | POA: Diagnosis not present

## 2021-01-04 DIAGNOSIS — R41841 Cognitive communication deficit: Secondary | ICD-10-CM | POA: Diagnosis not present

## 2021-01-04 DIAGNOSIS — S8254XD Nondisplaced fracture of medial malleolus of right tibia, subsequent encounter for closed fracture with routine healing: Secondary | ICD-10-CM | POA: Diagnosis not present

## 2021-01-04 DIAGNOSIS — R29898 Other symptoms and signs involving the musculoskeletal system: Secondary | ICD-10-CM | POA: Diagnosis not present

## 2021-01-04 DIAGNOSIS — M6281 Muscle weakness (generalized): Secondary | ICD-10-CM | POA: Diagnosis not present

## 2021-01-04 DIAGNOSIS — Z9181 History of falling: Secondary | ICD-10-CM | POA: Diagnosis not present

## 2021-01-06 DIAGNOSIS — S8254XD Nondisplaced fracture of medial malleolus of right tibia, subsequent encounter for closed fracture with routine healing: Secondary | ICD-10-CM | POA: Diagnosis not present

## 2021-01-06 DIAGNOSIS — M6281 Muscle weakness (generalized): Secondary | ICD-10-CM | POA: Diagnosis not present

## 2021-01-06 DIAGNOSIS — R2681 Unsteadiness on feet: Secondary | ICD-10-CM | POA: Diagnosis not present

## 2021-01-06 DIAGNOSIS — R41841 Cognitive communication deficit: Secondary | ICD-10-CM | POA: Diagnosis not present

## 2021-01-06 DIAGNOSIS — Z9181 History of falling: Secondary | ICD-10-CM | POA: Diagnosis not present

## 2021-01-06 DIAGNOSIS — R29898 Other symptoms and signs involving the musculoskeletal system: Secondary | ICD-10-CM | POA: Diagnosis not present

## 2021-01-08 DIAGNOSIS — S8254XD Nondisplaced fracture of medial malleolus of right tibia, subsequent encounter for closed fracture with routine healing: Secondary | ICD-10-CM | POA: Diagnosis not present

## 2021-01-08 DIAGNOSIS — Z9181 History of falling: Secondary | ICD-10-CM | POA: Diagnosis not present

## 2021-01-08 DIAGNOSIS — M6281 Muscle weakness (generalized): Secondary | ICD-10-CM | POA: Diagnosis not present

## 2021-01-08 DIAGNOSIS — R29898 Other symptoms and signs involving the musculoskeletal system: Secondary | ICD-10-CM | POA: Diagnosis not present

## 2021-01-08 DIAGNOSIS — R2681 Unsteadiness on feet: Secondary | ICD-10-CM | POA: Diagnosis not present

## 2021-01-08 DIAGNOSIS — R41841 Cognitive communication deficit: Secondary | ICD-10-CM | POA: Diagnosis not present

## 2021-01-09 DIAGNOSIS — S82851A Displaced trimalleolar fracture of right lower leg, initial encounter for closed fracture: Secondary | ICD-10-CM | POA: Diagnosis not present

## 2021-01-09 DIAGNOSIS — S93401A Sprain of unspecified ligament of right ankle, initial encounter: Secondary | ICD-10-CM | POA: Diagnosis not present

## 2021-01-11 DIAGNOSIS — M6281 Muscle weakness (generalized): Secondary | ICD-10-CM | POA: Diagnosis not present

## 2021-01-11 DIAGNOSIS — S8254XD Nondisplaced fracture of medial malleolus of right tibia, subsequent encounter for closed fracture with routine healing: Secondary | ICD-10-CM | POA: Diagnosis not present

## 2021-01-11 DIAGNOSIS — Z9181 History of falling: Secondary | ICD-10-CM | POA: Diagnosis not present

## 2021-01-11 DIAGNOSIS — R41841 Cognitive communication deficit: Secondary | ICD-10-CM | POA: Diagnosis not present

## 2021-01-11 DIAGNOSIS — R29898 Other symptoms and signs involving the musculoskeletal system: Secondary | ICD-10-CM | POA: Diagnosis not present

## 2021-01-11 DIAGNOSIS — R2681 Unsteadiness on feet: Secondary | ICD-10-CM | POA: Diagnosis not present

## 2021-01-12 ENCOUNTER — Other Ambulatory Visit: Payer: Self-pay | Admitting: Orthopaedic Surgery

## 2021-01-12 DIAGNOSIS — M6281 Muscle weakness (generalized): Secondary | ICD-10-CM | POA: Diagnosis not present

## 2021-01-12 DIAGNOSIS — R29898 Other symptoms and signs involving the musculoskeletal system: Secondary | ICD-10-CM | POA: Diagnosis not present

## 2021-01-12 DIAGNOSIS — S8254XD Nondisplaced fracture of medial malleolus of right tibia, subsequent encounter for closed fracture with routine healing: Secondary | ICD-10-CM | POA: Diagnosis not present

## 2021-01-12 DIAGNOSIS — Z9181 History of falling: Secondary | ICD-10-CM | POA: Diagnosis not present

## 2021-01-12 DIAGNOSIS — R2681 Unsteadiness on feet: Secondary | ICD-10-CM | POA: Diagnosis not present

## 2021-01-12 NOTE — Progress Notes (Signed)
Pt is a resident at [facility]. Spoke with Mardene Celeste, RN for pre-op call. She states pt is alert, oriented and able to speak for herself. Pt has hx of HTN, no cardiac history and is not diabetic.   Pre-op instruction faxed to Mardene Celeste at [phone].

## 2021-01-12 NOTE — Pre-Procedure Instructions (Addendum)
Peggy [name]  01/12/2021   [name]'s procedure is scheduled on 01/13/21.  Report to [facility] Entrance "A" Admitting Office  at 6:45 AM.  Call this number if you have problems the morning of surgery: [phone]    Remember:  Patient is not to eat food after midnight tonight.  You may drink clear liquids until 6:15 AM. Clear liquids allowed are:  Water, Juice (non-citric and without pulp - diabetics please choose diet or no sugar options), Carbonated beverages - (diabetics please choose diet or no sugar options), Clear Tea, Black Coffee only (no creamer, milk or cream including half and half), and Gatorade (diabetics please choose diet or no sugar options)    Take these medicines the morning of surgery with A SIP OF WATER: Mirabegron ER (Myrgetriq), Omeprazole (Prilosec), Acetaminophen (Tylenol) - prn  Please hold Vitamins this evening and in the AM.   Per instructions Dr. Suella Broad, Anesthesiologist/Alayne Estrella Stan Head, RN    Do not wear jewelry, make-up or nail polish.  Do not wear lotions, powders, perfumes or deodorant.  Do not shave 48 hours prior to surgery.    Do not bring valuables to the hospital.  [facility] is not responsible for any belongings or valuables.  Contacts, dentures or bridgework may not be worn into surgery.  Leave your suitcase in the car.  After surgery it may be brought to your room.  For patients admitted to the hospital, discharge time will be determined by your treatment team.  Patients discharged the day of surgery will not be allowed to drive home.   Any questions today, please call me, Lilia Pro, RN at [phone].

## 2021-01-13 ENCOUNTER — Non-Acute Institutional Stay (SKILLED_NURSING_FACILITY): Payer: Medicare Other | Admitting: Internal Medicine

## 2021-01-13 ENCOUNTER — Encounter (HOSPITAL_COMMUNITY): Payer: Self-pay | Admitting: Orthopaedic Surgery

## 2021-01-13 ENCOUNTER — Ambulatory Visit (HOSPITAL_COMMUNITY): Payer: Medicare Other

## 2021-01-13 ENCOUNTER — Ambulatory Visit (HOSPITAL_COMMUNITY)
Admission: RE | Admit: 2021-01-13 | Discharge: 2021-01-13 | Disposition: A | Discharge status: Home / Self Care | Payer: Medicare Other | Attending: Orthopaedic Surgery | Admitting: Orthopaedic Surgery

## 2021-01-13 ENCOUNTER — Encounter (HOSPITAL_COMMUNITY)
Admission: RE | Disposition: A | Discharge status: Home / Self Care | Payer: Self-pay | Source: Home / Self Care | Attending: Orthopaedic Surgery

## 2021-01-13 ENCOUNTER — Other Ambulatory Visit: Payer: Self-pay

## 2021-01-13 ENCOUNTER — Ambulatory Visit (HOSPITAL_COMMUNITY): Payer: Medicare Other | Admitting: Certified Registered"

## 2021-01-13 ENCOUNTER — Encounter: Payer: Self-pay | Admitting: Internal Medicine

## 2021-01-13 DIAGNOSIS — X58XXXA Exposure to other specified factors, initial encounter: Secondary | ICD-10-CM | POA: Diagnosis not present

## 2021-01-13 DIAGNOSIS — Z87891 Personal history of nicotine dependence: Secondary | ICD-10-CM | POA: Diagnosis not present

## 2021-01-13 DIAGNOSIS — N3942 Incontinence without sensory awareness: Secondary | ICD-10-CM

## 2021-01-13 DIAGNOSIS — Z8781 Personal history of (healed) traumatic fracture: Secondary | ICD-10-CM

## 2021-01-13 DIAGNOSIS — I10 Essential (primary) hypertension: Secondary | ICD-10-CM | POA: Diagnosis not present

## 2021-01-13 DIAGNOSIS — M8000XD Age-related osteoporosis with current pathological fracture, unspecified site, subsequent encounter for fracture with routine healing: Secondary | ICD-10-CM

## 2021-01-13 DIAGNOSIS — S93401A Sprain of unspecified ligament of right ankle, initial encounter: Secondary | ICD-10-CM | POA: Diagnosis not present

## 2021-01-13 DIAGNOSIS — S93431A Sprain of tibiofibular ligament of right ankle, initial encounter: Secondary | ICD-10-CM | POA: Diagnosis not present

## 2021-01-13 DIAGNOSIS — N189 Chronic kidney disease, unspecified: Secondary | ICD-10-CM | POA: Diagnosis not present

## 2021-01-13 DIAGNOSIS — S82851A Displaced trimalleolar fracture of right lower leg, initial encounter for closed fracture: Secondary | ICD-10-CM | POA: Insufficient documentation

## 2021-01-13 DIAGNOSIS — I129 Hypertensive chronic kidney disease with stage 1 through stage 4 chronic kidney disease, or unspecified chronic kidney disease: Secondary | ICD-10-CM | POA: Diagnosis not present

## 2021-01-13 DIAGNOSIS — D649 Anemia, unspecified: Secondary | ICD-10-CM | POA: Diagnosis not present

## 2021-01-13 DIAGNOSIS — M810 Age-related osteoporosis without current pathological fracture: Secondary | ICD-10-CM

## 2021-01-13 DIAGNOSIS — Z881 Allergy status to other antibiotic agents status: Secondary | ICD-10-CM | POA: Insufficient documentation

## 2021-01-13 DIAGNOSIS — S81812D Laceration without foreign body, left lower leg, subsequent encounter: Secondary | ICD-10-CM | POA: Diagnosis not present

## 2021-01-13 DIAGNOSIS — Z8249 Family history of ischemic heart disease and other diseases of the circulatory system: Secondary | ICD-10-CM | POA: Insufficient documentation

## 2021-01-13 DIAGNOSIS — Z9889 Other specified postprocedural states: Secondary | ICD-10-CM

## 2021-01-13 DIAGNOSIS — N1831 Chronic kidney disease, stage 3a: Secondary | ICD-10-CM | POA: Diagnosis not present

## 2021-01-13 DIAGNOSIS — I872 Venous insufficiency (chronic) (peripheral): Secondary | ICD-10-CM

## 2021-01-13 DIAGNOSIS — Z880 Allergy status to penicillin: Secondary | ICD-10-CM | POA: Insufficient documentation

## 2021-01-13 DIAGNOSIS — G8918 Other acute postprocedural pain: Secondary | ICD-10-CM | POA: Diagnosis not present

## 2021-01-13 DIAGNOSIS — Z888 Allergy status to other drugs, medicaments and biological substances status: Secondary | ICD-10-CM | POA: Insufficient documentation

## 2021-01-13 DIAGNOSIS — Z419 Encounter for procedure for purposes other than remedying health state, unspecified: Secondary | ICD-10-CM

## 2021-01-13 DIAGNOSIS — E440 Moderate protein-calorie malnutrition: Secondary | ICD-10-CM | POA: Diagnosis not present

## 2021-01-13 HISTORY — DX: Chronic kidney disease, unspecified: N18.9

## 2021-01-13 HISTORY — PX: ORIF ANKLE FRACTURE: SHX5408

## 2021-01-13 HISTORY — DX: Gastro-esophageal reflux disease without esophagitis: K21.9

## 2021-01-13 HISTORY — DX: Anemia, unspecified: D64.9

## 2021-01-13 LAB — COMPREHENSIVE METABOLIC PANEL
ALT: 17 U/L (ref 0–44)
AST: 22 U/L (ref 15–41)
Albumin: 3.3 g/dL — ABNORMAL LOW (ref 3.5–5.0)
Alkaline Phosphatase: 63 U/L (ref 38–126)
Anion gap: 9 (ref 5–15)
BUN: 23 mg/dL (ref 8–23)
CO2: 26 mmol/L (ref 22–32)
Calcium: 9.4 mg/dL (ref 8.9–10.3)
Chloride: 100 mmol/L (ref 98–111)
Creatinine, Ser: 1.57 mg/dL — ABNORMAL HIGH (ref 0.44–1.00)
GFR, Estimated: 31 mL/min — ABNORMAL LOW (ref >60)
Glucose, Bld: 85 mg/dL (ref 70–99)
Potassium: 3.9 mmol/L (ref 3.5–5.1)
Sodium: 135 mmol/L (ref 135–145)
Total Bilirubin: 0.7 mg/dL (ref 0.3–1.2)
Total Protein: 6 g/dL — ABNORMAL LOW (ref 6.5–8.1)

## 2021-01-13 LAB — SURGICAL PCR SCREEN
MRSA, PCR: NEGATIVE
Staphylococcus aureus: NEGATIVE

## 2021-01-13 LAB — CBC
HCT: 25.5 % — ABNORMAL LOW (ref 36.0–46.0)
Hemoglobin: 8 g/dL — ABNORMAL LOW (ref 12.0–15.0)
MCH: 31.6 pg (ref 26.0–34.0)
MCHC: 31.4 g/dL (ref 30.0–36.0)
MCV: 100.8 fL — ABNORMAL HIGH (ref 80.0–100.0)
Platelets: 241 10*3/uL (ref 150–400)
RBC: 2.53 MIL/uL — ABNORMAL LOW (ref 3.87–5.11)
RDW: 13.2 % (ref 11.5–15.5)
WBC: 5.9 10*3/uL (ref 4.0–10.5)
nRBC: 0 % (ref 0.0–0.2)

## 2021-01-13 LAB — TYPE AND SCREEN
ABO/RH(D): O POS
Antibody Screen: NEGATIVE

## 2021-01-13 LAB — ABO/RH: ABO/RH(D): O POS

## 2021-01-13 SURGERY — OPEN REDUCTION INTERNAL FIXATION (ORIF) ANKLE FRACTURE
Anesthesia: General | Site: Ankle | Laterality: Right

## 2021-01-13 MED ORDER — 0.9 % SODIUM CHLORIDE (POUR BTL) OPTIME
TOPICAL | Status: DC | PRN
  Administered 2021-01-13: 1000 mL

## 2021-01-13 MED ORDER — GLYCOPYRROLATE PF 0.2 MG/ML IJ SOSY
PREFILLED_SYRINGE | INTRAMUSCULAR | Status: AC
  Filled 2021-01-13: qty 1

## 2021-01-13 MED ORDER — FENTANYL CITRATE (PF) 100 MCG/2ML IJ SOLN: 25.0000 ug | Freq: Once | INTRAMUSCULAR | Status: AC

## 2021-01-13 MED ORDER — OXYCODONE HCL 5 MG PO TABS: 5.0000 mg | ORAL_TABLET | Freq: Once | ORAL | Status: DC | PRN

## 2021-01-13 MED ORDER — EPHEDRINE 5 MG/ML INJ
INTRAVENOUS | Status: AC
  Filled 2021-01-13: qty 5

## 2021-01-13 MED ORDER — LACTATED RINGERS IV SOLN: INTRAVENOUS | Status: DC | PRN

## 2021-01-13 MED ORDER — ACETAMINOPHEN 10 MG/ML IV SOLN: 1000.0000 mg | Freq: Once | INTRAVENOUS | Status: DC | PRN

## 2021-01-13 MED ORDER — BUPIVACAINE-EPINEPHRINE (PF) 0.5% -1:200000 IJ SOLN
INTRAMUSCULAR | Status: DC | PRN
  Administered 2021-01-13: 25 mL via PERINEURAL

## 2021-01-13 MED ORDER — ONDANSETRON HCL 4 MG/2ML IJ SOLN
INTRAMUSCULAR | Status: DC | PRN
  Administered 2021-01-13: 4 mg via INTRAVENOUS

## 2021-01-13 MED ORDER — ROPIVACAINE HCL 7.5 MG/ML IJ SOLN
INTRAMUSCULAR | Status: DC | PRN
  Administered 2021-01-13: 10 mL via PERINEURAL

## 2021-01-13 MED ORDER — ORAL CARE MOUTH RINSE: 15.0000 mL | Freq: Once | OROMUCOSAL | Status: DC

## 2021-01-13 MED ORDER — SODIUM CHLORIDE 0.9 % IV SOLN: INTRAVENOUS | Status: DC

## 2021-01-13 MED ORDER — LIDOCAINE HCL (PF) 2 % IJ SOLN
INTRAMUSCULAR | Status: DC | PRN
  Administered 2021-01-13: 100 mg via PERINEURAL

## 2021-01-13 MED ORDER — ACETAMINOPHEN 500 MG PO TABS: 1000.0000 mg | ORAL_TABLET | Freq: Once | ORAL | Status: DC | PRN

## 2021-01-13 MED ORDER — ACETAMINOPHEN 160 MG/5ML PO SOLN: 1000.0000 mg | Freq: Once | ORAL | Status: DC | PRN

## 2021-01-13 MED ORDER — PROPOFOL 500 MG/50ML IV EMUL
INTRAVENOUS | Status: DC | PRN
  Administered 2021-01-13: 75 ug/kg/min via INTRAVENOUS

## 2021-01-13 MED ORDER — OXYCODONE HCL 5 MG/5ML PO SOLN: 5.0000 mg | Freq: Once | ORAL | Status: DC | PRN

## 2021-01-13 MED ORDER — CLINDAMYCIN PHOSPHATE 900 MG/50ML IV SOLN
900.0000 mg | INTRAVENOUS | Status: AC
  Administered 2021-01-13: 900 mg via INTRAVENOUS
  Filled 2021-01-13: qty 50

## 2021-01-13 MED ORDER — EPHEDRINE SULFATE 50 MG/ML IJ SOLN
INTRAMUSCULAR | Status: DC | PRN
  Administered 2021-01-13: 5 mg via INTRAVENOUS

## 2021-01-13 MED ORDER — HYDROCODONE-ACETAMINOPHEN 5-325 MG PO TABS: 1.0000 | ORAL_TABLET | ORAL | 0 refills | Status: DC | PRN

## 2021-01-13 MED ORDER — FENTANYL CITRATE (PF) 100 MCG/2ML IJ SOLN
INTRAMUSCULAR | Status: AC
  Administered 2021-01-13: 25 ug via INTRAVENOUS
  Filled 2021-01-13: qty 2

## 2021-01-13 MED ORDER — PROPOFOL 10 MG/ML IV BOLUS
INTRAVENOUS | Status: DC | PRN
  Administered 2021-01-13: 20 mg via INTRAVENOUS

## 2021-01-13 MED ORDER — FENTANYL CITRATE (PF) 100 MCG/2ML IJ SOLN: 25.0000 ug | INTRAMUSCULAR | Status: DC | PRN

## 2021-01-13 MED ORDER — CHLORHEXIDINE GLUCONATE 0.12 % MT SOLN
15.0000 mL | Freq: Once | OROMUCOSAL | Status: DC
  Filled 2021-01-13: qty 15

## 2021-01-13 MED ORDER — HYDROCODONE-ACETAMINOPHEN 5-325 MG PO TABS: 1.0000 | ORAL_TABLET | Freq: Four times a day (QID) | ORAL | 0 refills | Status: DC | PRN

## 2021-01-13 SURGICAL SUPPLY — 71 items
ALCOHOL 70% 16 OZ (MISCELLANEOUS) ×2 IMPLANT
BAG COUNTER SPONGE SURGICOUNT (BAG) ×2 IMPLANT
BANDAGE ESMARK 6X9 LF (GAUZE/BANDAGES/DRESSINGS) IMPLANT
BIT DRILL 2.5 (BIT) ×2
BIT DRILL 2.5XSYNDESMOSIS (BIT) ×1 IMPLANT
BIT DRILL 2.6 CANN (BIT) ×2 IMPLANT
BIT DRL 2.5XSYNDESMOSIS (BIT) ×1
BLADE SURG 15 STRL LF DISP TIS (BLADE) IMPLANT
BLADE SURG 15 STRL SS (BLADE)
BNDG COHESIVE 4X5 TAN STRL (GAUZE/BANDAGES/DRESSINGS) ×2 IMPLANT
BNDG COHESIVE 6X5 TAN STRL LF (GAUZE/BANDAGES/DRESSINGS) IMPLANT
BNDG ELASTIC 4X5.8 VLCR STR LF (GAUZE/BANDAGES/DRESSINGS) ×2 IMPLANT
BNDG ELASTIC 6X10 VLCR STRL LF (GAUZE/BANDAGES/DRESSINGS) ×2 IMPLANT
BNDG ESMARK 6X9 LF (GAUZE/BANDAGES/DRESSINGS)
CANISTER SUCT 3000ML PPV (MISCELLANEOUS) ×2 IMPLANT
CHLORAPREP W/TINT 26 (MISCELLANEOUS) ×4 IMPLANT
COVER SURGICAL LIGHT HANDLE (MISCELLANEOUS) ×2 IMPLANT
CUFF TOURN SGL QUICK 34 (TOURNIQUET CUFF)
CUFF TOURN SGL QUICK 42 (TOURNIQUET CUFF) IMPLANT
CUFF TRNQT CYL 34X4.125X (TOURNIQUET CUFF) IMPLANT
DRAPE C-ARM 42X120 X-RAY (DRAPES) ×2 IMPLANT
DRAPE C-ARMOR (DRAPES) ×2 IMPLANT
DRAPE OEC MINIVIEW 54X84 (DRAPES) IMPLANT
DRAPE U-SHAPE 47X51 STRL (DRAPES) ×2 IMPLANT
DRSG MEPITEL 4X7.2 (GAUZE/BANDAGES/DRESSINGS) ×2 IMPLANT
DRSG PAD ABDOMINAL 8X10 ST (GAUZE/BANDAGES/DRESSINGS) ×4 IMPLANT
DRSG XEROFORM 1X8 (GAUZE/BANDAGES/DRESSINGS) ×2 IMPLANT
ELECT REM PT RETURN 9FT ADLT (ELECTROSURGICAL) ×2
ELECTRODE REM PT RTRN 9FT ADLT (ELECTROSURGICAL) ×1 IMPLANT
FIBULOCK IMPLANT SYSTEM STER (Miscellaneous) ×2 IMPLANT
GAUZE SPONGE 4X4 12PLY STRL (GAUZE/BANDAGES/DRESSINGS) ×2 IMPLANT
GAUZE SPONGE 4X4 12PLY STRL LF (GAUZE/BANDAGES/DRESSINGS) ×2 IMPLANT
GAUZE XEROFORM 5X9 LF (GAUZE/BANDAGES/DRESSINGS) ×2 IMPLANT
GLOVE SRG 8 PF TXTR STRL LF DI (GLOVE) ×1 IMPLANT
GLOVE SURG ENC TEXT LTX SZ7.5 (GLOVE) ×2 IMPLANT
GLOVE SURG UNDER POLY LF SZ8 (GLOVE) ×2
GOWN STRL REUS W/ TWL LRG LVL3 (GOWN DISPOSABLE) ×1 IMPLANT
GOWN STRL REUS W/ TWL XL LVL3 (GOWN DISPOSABLE) ×1 IMPLANT
GOWN STRL REUS W/TWL LRG LVL3 (GOWN DISPOSABLE) ×2
GOWN STRL REUS W/TWL XL LVL3 (GOWN DISPOSABLE) ×2
GUIDEWIRE 1.35MM (WIRE) ×4 IMPLANT
KIT BASIN OR (CUSTOM PROCEDURE TRAY) ×2 IMPLANT
KIT TURNOVER KIT B (KITS) ×2 IMPLANT
NAIL FIBULA RT 3.0X130 (Miscellaneous) ×2 IMPLANT
NS IRRIG 1000ML POUR BTL (IV SOLUTION) ×2 IMPLANT
PACK ORTHO EXTREMITY (CUSTOM PROCEDURE TRAY) ×2 IMPLANT
PAD ABD 8X10 STRL (GAUZE/BANDAGES/DRESSINGS) ×2 IMPLANT
PAD ARMBOARD 7.5X6 YLW CONV (MISCELLANEOUS) ×4 IMPLANT
PAD CAST 4YDX4 CTTN HI CHSV (CAST SUPPLIES) ×1 IMPLANT
PADDING CAST ABS 4INX4YD NS (CAST SUPPLIES) ×1
PADDING CAST ABS COTTON 4X4 ST (CAST SUPPLIES) ×1 IMPLANT
PADDING CAST COTTON 4X4 STRL (CAST SUPPLIES) ×2
SCREW CORT 3.5X40 LP ANKLE (Screw) ×2 IMPLANT
SCREW CORTEX LP TM SS 2.7X16 (Screw) ×2 IMPLANT
SCREW LO PRO 2.7X22MM CORTEX (Screw) ×2 IMPLANT
SCREW LOW PROFILE 4.0X40 (Screw) ×4 IMPLANT
SCREW TM SS 2.7X14 CORTEX (Screw) ×2 IMPLANT
SPLINT PLASTER CAST XFAST 5X30 (CAST SUPPLIES) ×1 IMPLANT
SPLINT PLASTER XFAST SET 5X30 (CAST SUPPLIES) ×1
SPONGE T-LAP 18X18 ~~LOC~~+RFID (SPONGE) ×2 IMPLANT
SUCTION FRAZIER HANDLE 10FR (MISCELLANEOUS) ×2
SUCTION TUBE FRAZIER 10FR DISP (MISCELLANEOUS) ×1 IMPLANT
SUT ETHILON 3 0 PS 1 (SUTURE) ×2 IMPLANT
SUT MON AB 3-0 SH 27 (SUTURE) ×2
SUT MON AB 3-0 SH27 (SUTURE) ×1 IMPLANT
SUT VIC AB 2-0 CT1 27 (SUTURE) ×4
SUT VIC AB 2-0 CT1 TAPERPNT 27 (SUTURE) ×2 IMPLANT
SYSTEM IMPLANT FIBULOCK STRL (Miscellaneous) ×1 IMPLANT
TOWEL GREEN STERILE (TOWEL DISPOSABLE) ×2 IMPLANT
TOWEL GREEN STERILE FF (TOWEL DISPOSABLE) ×2 IMPLANT
TUBE CONNECTING 12X1/4 (SUCTIONS) ×2 IMPLANT

## 2021-01-13 NOTE — Op Note (Signed)
Peggy [name] female 87 y.o. 01/13/2021  PreOperative Diagnosis: Right trimalleolar ankle fracture  PostOperative Diagnosis: Right trimalleolar ankle fracture Syndesmosis disruption  PROCEDURE: Open treatment of right trimalleolar ankle fracture without posterior fixation Open treatment of syndesmosis Ankle stress view fluoroscopy  SURGEON: Melony Overly, MD  ASSISTANT: Jesse Martinique, PA-S   ANESTHESIA: General with peripheral nerve block  FINDINGS: See below  IMPLANTS: Arthrex fibular locking nail 4.0 mm partially-threaded cannulated screws  INDICATIONS:87 y.o. female sustained the above fracture with acceptable alignment.  She was splinted in the ER.  Attempt was made for closed treatment however on follow-up she had interval valgus displacement and translation of her fracture site.  Given the amount of displacement and her baseline ambulatory status she was indicated for surgery.   Patient understood the risks, benefits and alternatives to surgery which include but are not limited to wound healing complications, infection, nonunion, malunion, need for further surgery as well as damage to surrounding structures. They also understood the potential for continued pain in that there were no guarantees of acceptable outcome After weighing these risks the patient opted to proceed with surgery.  PROCEDURE: Patient was identified in the preoperative holding area.  The right leg was marked by myself.  Consent was signed by myself and the patient.  Block was performed by anesthesia in the preoperative holding area.  Patient was taken to the operative suite and placed supine on the operative table.  MAC anesthesia was induced without difficulty. Bump was placed under the operative hip and bone foam was used.  All bony prominences were well padded.  Tourniquet was placed on the operative thigh.  Preoperative antibiotics were given. The extremity was prepped and draped in the usual  sterile fashion and surgical timeout was performed.    We began by making a longitudinal incision at the distal aspect of the fibula.  This was taken sharply down through skin and subcutaneous tissue.  Then fluoroscopy confirmed appropriate reduction with holding the ankle in a inverted and varus position.  Then the wire was placed at the tip of the fibula in the appropriate starting position.  It was advanced up into the fibula and across the fracture site without difficulty.  Then the opening reamer was used to breach the distal aspect of the fibular cortex.  Then the wire was removed.  The nitinol wire was then placed using the finger reducer across the fracture site advanced up the intramedullary canal of the fibula.  Then the canal was reamed.  Then the wire was removed.  The fibular nail was placed within the fibula without difficulty.  There was acceptable reduction of the fibula after placement of the nail.  The 3 distal interlocking screws were placed.  The proximal flanges were employed.  We then then turned our attention to the medial malleolus.  Incision was created on the distal aspect of the medial malleolus.  This was taken sharply down through skin and subcutaneous tissue.  Then a K wire was placed at the tip of the medial malleolus within the anterior and interclavicular groove.  2 wires were placed.  Position of the wires were confirmed to be acceptable.  Reduction of the fracture site was acceptable on fluoroscopy.  Then 2 partially-threaded cannulated screws were placed across the fracture site.  In the lateral plane the posterior fragment was acceptably reduced and did not need internal fixation.  Then ankle stress view fluoroscopy was obtained.  There was instability across the syndesmosis.  She had  very poor bone quality.    An incision was made overlying the insertion site of the syndesmosis screw.  This taken sharply down through skin and subcutaneous tissue.  Then fluoroscopy  confirmed appropriate reduction of the syndesmosis and it was held provisionally.  Then using the guide a single syndesmosis screw was placed.  This was after acceptable reduction of the syndesmosis noted on fluoroscopy.  The ankle mortise was acceptably aligned.  Final fluoroscopic images were obtained.  The incisions were irrigated with saline.  Wounds were closed with staples.  Soft dressing and short leg splint was placed.  She was awakened from anesthesia and taken recovery in stable condition.  POST OPERATIVE INSTRUCTIONS: Nonweightbearing to operative extremity Keep splint dry and intact Follow-up in 2 weeks for splint removal, x-rays of the right ankle and likely suture removal.  She will be placed into a cast.  TOURNIQUET TIME: No tourniquet was used  BLOOD LOSS:  Minimal         DRAINS: none         SPECIMEN: none       COMPLICATIONS:  * No complications entered in OR log *         Disposition: PACU - hemodynamically stable.         Condition: stable

## 2021-01-13 NOTE — Discharge Instructions (Signed)
DR. Taffany [facility] SURGERY POST-OP INSTRUCTIONS   Pain Management The numbing medicine and your leg will last around 18 hours, take a dose of your pain medicine as soon as you feel it wearing off to avoid rebound pain. Keep your foot elevated above heart level.  Make sure that your heel hangs free ('floats'). Take all prescribed medication as directed. If taking narcotic pain medication you may want to use an over-the-counter stool softener to avoid constipation. You may take over-the-counter NSAIDs (ibuprofen, naproxen, etc.) as well as over-the-counter acetaminophen as directed on the packaging as a supplement for your pain and may also use it to wean away from the prescription medication.  Activity Non-weightbearing Keep splint intact  First Postoperative Visit Your first postop visit will be at least 2 weeks after surgery.  This should be scheduled when you schedule surgery. If you do not have a postoperative visit scheduled please call [phone] to schedule an appointment. At the appointment your incision will be evaluated for suture removal, x-rays will be obtained if necessary.  General Instructions Swelling is very common after foot and ankle surgery.  It often takes 3 months for the foot and ankle to begin to feel comfortable.  Some amount of swelling will persist for 6-12 months. DO NOT change the dressing.  If there is a problem with the dressing (too tight, loose, gets wet, etc.) please contact Dr. Hanah Moultry's office. DO NOT get the dressing wet.  For showers you can use an over-the-counter cast cover or wrap a washcloth around the top of your dressing and then cover it with a plastic bag and tape it to your leg. DO NOT soak the incision (no tubs, pools, bath, etc.) until you have approval from Dr. Teasha Murrillo.  Contact Dr. Adairs office or go to Emergency Room if: Temperature above 101 F. Increasing pain that is unresponsive to pain medication or elevation Excessive redness or  swelling in your foot Dressing problems - excessive bloody drainage, looseness or tightness, or if dressing gets wet Develop pain, swelling, warmth, or discoloration of your calf  

## 2021-01-13 NOTE — Progress Notes (Addendum)
Provider:  Veleta Miners MD Location:   [place] Room Number: 58 Place of Service:  SNF (31)  PCP: Mast, Man X, NP Patient Care Team: Mast, Man X, NP as PCP - General (Internal Medicine)  Extended Emergency Contact Information Primary Emergency Contact: [place] Phone: [phone] Work Phone: [phone] Relation: None Secondary Emergency Contact: Moring,[name] Address: [place],  [address] [name] Phone: [phone] Work Phone: [phone] Mobile Phone: [phone] Relation: Daughter  Code Status: DNR Goals of Care: Advanced Directive information Advanced Directives 02/18/2021  Does Patient Have a Medical Advance Directive? Yes  Type of Paramedic of [place];Living will;Out of facility DNR (pink MOST or yellow form)  Does patient want to make changes to medical advance directive? No - Patient declined  Copy of [place] in Chart? Yes - validated most recent copy scanned in chart (See row information)  Pre-existing out of facility DNR order (yellow form or pink MOST form) Yellow form placed in chart (order not valid for inpatient use);Pink MOST form placed in chart (order not valid for inpatient use)      Chief Complaint  Patient presents with   New Admit To SNF    Admission to SNF    HPI: Patient is a 87 y.o. female seen today for admission to SNF for Care and therapy  Patient has a history of urinary incontinence lower extremity edema ,hypertension, CKD, Anemia history of osteoporosis  Patient had a fall on 12/24/20 .  The x-ray showed Right  ankle fracture  displaced trimalleolar ankle fracture with likely syndesmotic disruption and Skin tear of left Leg. She failed initial conservative management with splint and immobilization and NWB. Elected for surgical management Underwent ORIF today Is back in SNF now. Pain seems to  be controlled but is normal in that leg.  No nausea or vomiting No Other complains today  Past Medical History:  Diagnosis Date   Anemia    Chronic kidney disease    stage 3   GERD (gastroesophageal reflux disease)    Hypertension    Past Surgical History:  Procedure Laterality Date   ORIF ANKLE FRACTURE Right 01/13/2021   Procedure: OPEN TREATMENT OF RIGHT TRIMALLEOLAR ANKLE FRACTURE WITHOUT POSTERIOR FIXATION, POSSIBLE SYNDESMOSIS;  Surgeon: Erle Crocker, MD;  Location: [facility];  Service: Orthopedics;  Laterality: Right;    reports that she has quit smoking. Her smoking use included cigarettes. She has never used smokeless tobacco. She reports that she does not currently use alcohol. She reports that she does not use drugs. Social History   Socioeconomic History   Marital status: Widowed    Spouse name: Tyrone Nine   Number of children: 2   Years of education: Not on file   Highest education level: Not on file  Occupational History   Occupation: Pharmacist, hospital    Comment: retired  Tobacco Use   Smoking status: Former    Years: 12.00    Types: Cigarettes   Smokeless tobacco: Never  Vaping Use   Vaping Use: Never used  Substance and Sexual Activity   Alcohol use: Not Currently   Drug use: No   Sexual activity: Not on file  Other Topics Concern   Not on file  Social History Narrative   Social History     Socioeconomic History       Marital status: Married   09/01/1954  Spouse name: Tyrone Nine   Two people stay in the home on the fourth floor       Number of children: 2       Years of education:       Highest education level: Not on file    Exercise: yoga, walking     Social Needs       Financial resource strain: Not on file       Food insecurity - worry: Not on file       Food insecurity - inability: Not on file       Transportation needs - medical: Not on file       Transportation needs - non-medical: Not on file     Occupational History       Not on file     Tobacco  Use       Smoking status: Never Smoker       Smokeless tobacco: Never Used     Substance and Sexual Activity       Alcohol use: No       Drug use: No       Sexual activity: Not on file     Other Topics       Concerns:         Not on file     Social History Narrative       Not on file   Social Determinants of Health   Financial Resource Strain: Not on file  Food Insecurity: Not on file  Transportation Needs: Not on file  Physical Activity: Not on file  Stress: Not on file  Social Connections: Not on file  Intimate Partner Violence: Not on file    Functional Status Survey:    Family History  Problem Relation Age of Onset   Heart failure Mother    Heart disease Father    Arthritis Sister     Health Maintenance  Topic Date Due   Zoster Vaccines- Shingrix (1 of 2) Never done   INFLUENZA VACCINE  01/12/2021   COVID-19 Vaccine (4 - Booster for Moderna series) 01/20/2021   TETANUS/TDAP  11/26/2029   DEXA SCAN  Completed   PNA vac Low Risk Adult  Completed   HPV VACCINES  Aged Out    Allergies  Allergen Reactions   Ace Inhibitors    Flagyl [Metronidazole]    Penicillins Swelling    facial    Allergies as of 01/13/2021       Reactions   Ace Inhibitors    Flagyl [metronidazole]    Penicillins Swelling   facial        Medication List      Notice   This visit is during an admission. Changes to the med list made in this visit will be reflected in the After Visit Summary of the admission.     Review of Systems Review of Systems  Constitutional: Negative for activity change, appetite change, chills, diaphoresis, fatigue and fever.  HENT: Negative for mouth sores, postnasal drip, rhinorrhea, sinus pain and sore throat.   Respiratory: Negative for apnea, cough, chest tightness, shortness of breath and wheezing.   Cardiovascular: Negative for chest pain, palpitations and leg swelling.  Gastrointestinal: Negative for abdominal distention, abdominal pain,  constipation, diarrhea, nausea and vomiting.  Genitourinary: Negative for dysuria and frequency.  Musculoskeletal: Negative for arthralgias, joint swelling and myalgias.  Skin: Negative for rash.  Neurological: Negative for dizziness, syncope, weakness, light-headedness and numbness.  Psychiatric/Behavioral: Negative for behavioral problems,  confusion and sleep disturbance.    Vitals:   01/13/21 1424  BP: 122/70  Pulse: 97  Resp: 16  Temp: (!) 97.5 F (36.4 C)  SpO2: 96%  Weight: 114 lb 1.6 oz (51.8 kg)  Height: 5' 4.5" (1.638 m)   Body mass index is 19.28 kg/m. Physical Exam Constitutional: Oriented to person, place, and time. Well-developed and well-nourished.  HENT:  Head: Normocephalic.  Mouth/Throat: Oropharynx is clear and moist.  Eyes: Pupils are equal, round, and reactive to light.  Neck: Neck supple.  Cardiovascular: Normal rate and normal heart sounds.  No murmur heard. Pulmonary/Chest: Effort normal and breath sounds normal. No respiratory distress. No wheezes. She has no rales.  Abdominal: Soft. Bowel sounds are normal. No distension. There is no tenderness. There is no rebound.  Musculoskeletal: Right Leg in the Splint Left Leg has skin tear with some Eschar in the Middle Chronic Venous changes No Signs of Infection  Lymphadenopathy: none Neurological: Alert and oriented to person, place, and time.  Skin: Skin is warm and dry.  Psychiatric: Normal mood and affect. Behavior is normal. Thought content normal.   Labs reviewed: Basic Metabolic Panel: Recent Labs    11/20/20 0730 12/08/20 0828 12/20/20 0000 12/23/20 0000 01/13/21 0741  NA 138 136 139 136* 135  K 4.2 4.6 4.5 5.2 3.9  CL 102 102 104 101 100  CO2 26 22 25* 20 26  GLUCOSE 78 79  --   --  85  BUN 44* 42* 29* 41* 23  CREATININE 1.89* 1.92* 1.9* 1.7* 1.57*  CALCIUM 10.4 10.3 9.1 9.8 9.4   Liver Function Tests: Recent Labs    11/20/20 0730 12/08/20 0828 12/20/20 0000 12/23/20 0000  01/13/21 0741  AST 20 21 20 26 22   ALT 15 12 15 13 17   ALKPHOS  --   --  52 57 63  BILITOT 0.5 0.4  --   --  0.7  PROT 6.7 6.7  --   --  6.0*  ALBUMIN  --   --  4.2 4.2 3.3*   No results for input(s): LIPASE, AMYLASE in the last 8760 hours. No results for input(s): AMMONIA in the last 8760 hours. CBC: Recent Labs    11/20/20 0730 12/08/20 0828 12/20/20 0000 01/13/21 0741 01/22/21 0000 02/05/21 0000 02/12/21 0000  WBC 7.3 6.5   < > 5.9 6.5 5.7 5.7  NEUTROABS 3,614 3,244   < >  --  3,634.00 3,386.00 3,392.00  HGB 9.0* 8.6*   < > 8.0* 8.0* 7.7* 8.1*  HCT 27.1* 26.6*   < > 25.5* 24* 24* 25*  MCV 96.1 98.5  --  100.8*  --   --   --   PLT 228 180   < > 241 251 212 217   < > = values in this interval not displayed.   Cardiac Enzymes: No results for input(s): CKTOTAL, CKMB, CKMBINDEX, TROPONINI in the last 8760 hours. BNP: Invalid input(s): POCBNP No results found for: HGBA1C Lab Results  Component Value Date   TSH 2.48 11/20/2020   No results found for: VITAMINB12 No results found for: FOLATE Lab Results  Component Value Date   IRON 62 12/08/2020   TIBC 253 12/08/2020   FERRITIN 100 12/08/2020    Imaging and Procedures obtained prior to SNF admission: DG Ankle Complete Right  Result Date: 01/13/2021 CLINICAL DATA:  Surgery. Open treatment of a right trimalleolar fracture. EXAM: RIGHT ANKLE - COMPLETE 3+ VIEW; DG C-ARM 1-60 MIN COMPARISON:  November 24, 2020. FINDINGS: Radiation Exposure Index (as provided by the fluoroscopic device): 1.86 mGy Fluoroscopy Time:  1 minutes and 6 seconds. Number of Acquired Images:  5 5 C-arm fluoroscopic images were obtained intraoperatively and submitted for post operative interpretation. These images demonstrate intramedullary rod and screw fixation of the distal fibula with a single screw crossing into the adjacent tibia. Also, 2 screw fixation of the medial malleolus. Alignment appears near anatomic. Please see the performing provider's  procedural report for further detail. IMPRESSION: Intraoperative fluoroscopy, as detailed above. Electronically Signed   By: Margaretha Sheffield MD   On: 01/13/2021 14:03   DG C-Arm 1-60 Min  Result Date: 01/13/2021 CLINICAL DATA:  Surgery. Open treatment of a right trimalleolar fracture. EXAM: RIGHT ANKLE - COMPLETE 3+ VIEW; DG C-ARM 1-60 MIN COMPARISON:  November 24, 2020. FINDINGS: Radiation Exposure Index (as provided by the fluoroscopic device): 1.86 mGy Fluoroscopy Time:  1 minutes and 6 seconds. Number of Acquired Images:  5 5 C-arm fluoroscopic images were obtained intraoperatively and submitted for post operative interpretation. These images demonstrate intramedullary rod and screw fixation of the distal fibula with a single screw crossing into the adjacent tibia. Also, 2 screw fixation of the medial malleolus. Alignment appears near anatomic. Please see the performing provider's procedural report for further detail. IMPRESSION: Intraoperative fluoroscopy, as detailed above. Electronically Signed   By: Margaretha Sheffield MD   On: 01/13/2021 14:03    Assessment/Plan Noninfected skin tear of leg, left, subsequent encounter Xeroform dressing S/P ORIF (open reduction internal fixation) fracture for Trimalleolar Ankle Fracture Pain Control With Norco, Tylenol NWB Splint removal in 2 weeks  Anemia, unspecified type On Iron Already Repeat in 1 week  Urinary incontinence  On Myrbetriq Primary hypertension Good control with Diovan Stage 3b chronic kidney disease ([place]) Repeat BMP in 1 week So far stable Venous insufficiency (chronic) (peripheral) On Low dose of Lasix Age-related osteoporosis  On Fosamax     Family/ staff Communication:   Labs/tests ordered:BMP and CBC

## 2021-01-13 NOTE — Anesthesia Procedure Notes (Addendum)
Anesthesia Regional Block: Adductor canal block   Pre-Anesthetic Checklist: , timeout performed,  Correct Patient, Correct Site, Correct Laterality,  Correct Procedure, Correct Position, site marked,  Risks and benefits discussed,  Surgical consent,  Pre-op evaluation,  At surgeon's request and post-op pain management  Laterality: Right and Lower  Prep: chloraprep       Needles:  Injection technique: Single-shot      Needle Length: 9cm  Needle Gauge: 22     Additional Needles: Arrow StimuQuik ECHO Echogenic Stimulating PNB Needle  Procedures:,,,, ultrasound used (permanent image in chart),,    Narrative:  Start time: 01/13/2021 9:25 AM End time: 01/13/2021 9:29 AM Injection made incrementally with aspirations every 5 mL.  Performed by: Personally  Anesthesiologist: Oleta Mouse, MD

## 2021-01-13 NOTE — Anesthesia Preprocedure Evaluation (Addendum)
Anesthesia Evaluation  Patient identified by MRN, date of birth, ID band Patient awake    Reviewed: Allergy & Precautions, NPO status , Patient's Chart, lab work & pertinent test results  History of Anesthesia Complications Negative for: history of anesthetic complications  Airway Mallampati: III  TM Distance: >3 FB Neck ROM: Full    Dental  (+) Dental Advisory Given   Pulmonary neg shortness of breath, neg sleep apnea, neg COPD, neg recent URI, former smoker,  Covid-19 Nucleic Acid Test Results No results found for: SARSCOV2NAA, SARSCOV2    breath sounds clear to auscultation       Cardiovascular hypertension, Pt. on medications (-) angina(-) Past MI and (-) CHF  Rhythm:Regular     Neuro/Psych negative psych ROS   GI/Hepatic negative GI ROS, Neg liver ROS,   Endo/Other    Renal/GU CRFRenal diseaseLab Results      Component                Value               Date                      CREATININE               1.92 (H)            12/08/2020           Lab Results      Component                Value               Date                      K                        4.6                 12/08/2020                Musculoskeletal  RIGHT TRIAMALL ANKLE FRACTURE   Abdominal   Peds  Hematology  (+) Blood dyscrasia, anemia , Lab Results      Component                Value               Date                      WBC                      6.5                 12/08/2020                HGB                      8.6 (L)             12/08/2020                HCT                      26.6 (L)            12/08/2020  MCV                      98.5                12/08/2020                PLT                      180                 12/08/2020              Anesthesia Other Findings   Reproductive/Obstetrics                           Anesthesia Physical Anesthesia Plan  ASA: 3  Anesthesia Plan:  MAC and Regional   Post-op Pain Management:    Induction: Intravenous  PONV Risk Score and Plan: 2 and Ondansetron and Propofol infusion  Airway Management Planned: Nasal Cannula  Additional Equipment: None  Intra-op Plan:   Post-operative Plan:   Informed Consent: I have reviewed the patients History and Physical, chart, labs and discussed the procedure including the risks, benefits and alternatives for the proposed anesthesia with the patient or authorized representative who has indicated his/her understanding and acceptance.     Dental advisory given  Plan Discussed with: CRNA and Anesthesiologist  Anesthesia Plan Comments:        Anesthesia Quick Evaluation

## 2021-01-13 NOTE — Transfer of Care (Signed)
Immediate Anesthesia Transfer of Care Note  Patient: Peggy [name]  Procedure(s) Performed: OPEN TREATMENT OF RIGHT TRIMALLEOLAR ANKLE FRACTURE WITHOUT POSTERIOR FIXATION, POSSIBLE SYNDESMOSIS (Right: Ankle)  Patient Location: PACU  Anesthesia Type:MAC and Regional  Level of Consciousness: awake, alert  and patient cooperative  Airway & Oxygen Therapy: Patient Spontanous Breathing and Patient connected to face mask oxygen  Post-op Assessment: Report given to RN and Post -op Vital signs reviewed and stable  Post vital signs: Reviewed and stable  Last Vitals:  Vitals Value Taken Time  BP 125/79 01/13/21 1112  Temp    Pulse 86 01/13/21 1113  Resp 14 01/13/21 1113  SpO2 100 % 01/13/21 1113  Vitals shown include unvalidated device data.  Last Pain:  Vitals:   01/13/21 0831  TempSrc:   PainSc: 0-No pain         Complications: No notable events documented.

## 2021-01-13 NOTE — Anesthesia Procedure Notes (Addendum)
Anesthesia Regional Block: Popliteal block   Pre-Anesthetic Checklist: , timeout performed,  Correct Patient, Correct Site, Correct Laterality,  Correct Procedure, Correct Position, site marked,  Risks and benefits discussed,  Surgical consent,  Pre-op evaluation,  At surgeon's request and post-op pain management  Laterality: Right and Lower  Prep: chloraprep       Needles:  Injection technique: Single-shot      Needle Length: 9cm  Needle Gauge: 22     Additional Needles: Arrow StimuQuik ECHO Echogenic Stimulating PNB Needle  Procedures:,,,, ultrasound used (permanent image in chart),,    Narrative:  Start time: 01/13/2021 9:30 AM End time: 01/13/2021 9:34 AM Injection made incrementally with aspirations every 5 mL.  Performed by: Personally  Anesthesiologist: Oleta Mouse, MD

## 2021-01-13 NOTE — Anesthesia Postprocedure Evaluation (Signed)
Anesthesia Post Note  Patient: Peggy [name]  Procedure(s) Performed: OPEN TREATMENT OF RIGHT TRIMALLEOLAR ANKLE FRACTURE WITHOUT POSTERIOR FIXATION, POSSIBLE SYNDESMOSIS (Right: Ankle)     Patient location during evaluation: PACU Anesthesia Type: Regional and MAC Level of consciousness: awake and patient cooperative Pain management: pain level controlled Vital Signs Assessment: post-procedure vital signs reviewed and stable Respiratory status: spontaneous breathing, nonlabored ventilation, respiratory function stable and patient connected to nasal cannula oxygen Cardiovascular status: stable and blood pressure returned to baseline Postop Assessment: no apparent nausea or vomiting Anesthetic complications: no   No notable events documented.  Last Vitals:  Vitals:   01/13/21 1200 01/13/21 1215  BP: 130/82 132/76  Pulse: 78 74  Resp: 15 20  Temp:    SpO2: 97% 99%    Last Pain:  Vitals:   01/13/21 1215  TempSrc:   PainSc: 0-No pain                 Falynn Ailey

## 2021-01-13 NOTE — H&P (Signed)
PREOPERATIVE H&P  Chief Complaint: Right ankle pain  HPI: Peggy [name] is a 87 y.o. female who presents for preoperative history and physical with a diagnosis of displaced trimalleolar ankle fracture with likely syndesmotic disruption. She was seen in the office originally and due to chronic lower extremity edema attempt was made at nonoperative treatment.  Unfortunately she went on to displace despite splint immobilization and maintenance of nonweightbearing.  Given the amount of displacement she is indicated for open treatment.  She is here today for surgery. Symptoms are rated as moderate to severe, and have been worsening.  This is significantly impairing activities of daily living.  She has elected for surgical management.   Past Medical History:  Diagnosis Date   Anemia    Chronic kidney disease    stage 3   GERD (gastroesophageal reflux disease)    Hypertension    History reviewed. No pertinent surgical history. Social History   Socioeconomic History   Marital status: Widowed    Spouse name: Tyrone Nine   Number of children: 2   Years of education: Not on file   Highest education level: Not on file  Occupational History   Occupation: Pharmacist, hospital    Comment: retired  Tobacco Use   Smoking status: Former    Years: 12.00    Types: Cigarettes   Smokeless tobacco: Never  Vaping Use   Vaping Use: Never used  Substance and Sexual Activity   Alcohol use: Not Currently   Drug use: No   Sexual activity: Not on file  Other Topics Concern   Not on file  Social History Narrative   Social History     Socioeconomic History       Marital status: Married   09/01/1954       Spouse name: Tyrone Nine   Two people stay in the home on the fourth floor       Number of children: 2       Years of education:       Highest education level: Not on file    Exercise: yoga, walking     Social Needs       Financial resource strain: Not on file       Food insecurity - worry: Not on file       Food  insecurity - inability: Not on file       Transportation needs - medical: Not on file       Transportation needs - non-medical: Not on file     Occupational History       Not on file     Tobacco Use       Smoking status: Never Smoker       Smokeless tobacco: Never Used     Substance and Sexual Activity       Alcohol use: No       Drug use: No       Sexual activity: Not on file     Other Topics       Concerns:         Not on file     Social History Narrative       Not on file   Social Determinants of Health   Financial Resource Strain: Not on file  Food Insecurity: Not on file  Transportation Needs: Not on file  Physical Activity: Not on file  Stress: Not on file  Social Connections: Not on file   Family History  Problem Relation Age of Onset  Heart failure Mother    Heart disease Father    Arthritis Sister    Allergies  Allergen Reactions   Ace Inhibitors    Flagyl [Metronidazole]    Penicillins Swelling    facial   Prior to Admission medications   Medication Sig Start Date End Date Taking? Authorizing Provider  acetaminophen (TYLENOL) 325 MG tablet Take 650 mg by mouth every 4 (four) hours as needed.   Yes [provider]  Calcium Carb-Cholecalciferol 500-400 MG-UNIT TABS Take 1 tablet by mouth daily.   Yes [provider]  cholecalciferol (VITAMIN D) 1000 units tablet Take 2,000 Units by mouth daily.   Yes [provider]  ferrous sulfate 325 (65 FE) MG tablet Take 325 mg by mouth daily with breakfast. MON, WED, FRI   Yes [provider]  furosemide (LASIX) 20 MG tablet Take 20 mg by mouth daily. 12/18/20  Yes [provider]  mirabegron ER (MYRBETRIQ) 50 MG TB24 tablet Take 50 mg by mouth daily.   Yes [provider]  Multiple Vitamins-Minerals (ICAPS AREDS 2 PO) Take by mouth 2 (two) times daily.   Yes [provider]  omeprazole (PRILOSEC) 20 MG capsule Take 20 mg by mouth daily. 12/10/20  Yes [provider]  potassium chloride SA (KLOR-CON) 20 MEQ tablet Take 20 mEq by mouth daily. 12/16/20  Yes [provider]  valsartan (DIOVAN) 160 MG tablet TAKE 1 TABLET BY MOUTH  DAILY 12/20/19  Yes Mast, Man X, NP  vitamin C (ASCORBIC ACID) 250 MG tablet Take 250 mg by mouth daily.   Yes [provider]  alendronate (FOSAMAX) 70 MG tablet Take 1 tablet (70 mg total) by mouth every 7 (seven) days. Take with a full glass of water on an empty stomach. 12/09/20 12/09/21  Mast, Man X, NP  hydrocortisone cream 1 % Apply 1 application topically 2 (two) times daily. 12/16/20   [provider]  nystatin (MYCOSTATIN/NYSTOP) powder Apply 1 application topically 2 (two) times daily.    [provider]  polycarbophil (FIBERCON) 625 MG tablet Take 625 mg by mouth as needed.     [provider]     Positive ROS: All other systems have been reviewed and were otherwise negative with the exception of those mentioned in the HPI and as above.  Physical Exam:  Vitals:   01/13/21 0815 01/13/21 0915  BP: (!) 197/87 (!) 188/88  Pulse: 70 71  Resp:  (!) 21  Temp:    SpO2: 94% 100%   General: Alert, no acute distress Cardiovascular: No pedal edema Respiratory: No cyanosis, no use of accessory musculature GI: No organomegaly, abdomen is soft and non-tender Skin: No lesions in the area of chief complaint Neurologic: Sensation intact distally Psychiatric: Patient is competent for consent with normal mood and affect Lymphatic: No axillary or cervical lymphadenopathy  MUSCULOSKELETAL: Right ankle in a short leg splint.  Swelling above the splint and below the splint.  No tenderness palpation of the dorsal foot.  Foot is warm and well-perfused with intact sensation distally.  Assessment: Right displaced trimalleolar ankle fracture in the setting of chronic lower extremity edema   Plan: Plan for open treatment of her right ankle fracture.  We will attempt for small incision  fixation if we can obtain acceptable reduction.  She does understand that she has an increased risk of wound problems given her lower extremity edema and ecchymosis.  She is willing to accept these risks.  We discussed the  risks, benefits and alternatives of surgery which include but are not limited to wound healing complications, infection, nonunion, malunion, need for further surgery, damage to surrounding structures and continued pain.  They understand there is no guarantees to an acceptable outcome.  After weighing these risks they opted to proceed with surgery.     Erle Crocker, MD    01/13/2021 9:17 AM

## 2021-01-14 ENCOUNTER — Encounter (HOSPITAL_COMMUNITY): Payer: Self-pay | Admitting: Orthopaedic Surgery

## 2021-01-14 DIAGNOSIS — R2681 Unsteadiness on feet: Secondary | ICD-10-CM | POA: Diagnosis not present

## 2021-01-14 DIAGNOSIS — Z9181 History of falling: Secondary | ICD-10-CM | POA: Diagnosis not present

## 2021-01-14 DIAGNOSIS — M6281 Muscle weakness (generalized): Secondary | ICD-10-CM | POA: Diagnosis not present

## 2021-01-14 DIAGNOSIS — S8254XD Nondisplaced fracture of medial malleolus of right tibia, subsequent encounter for closed fracture with routine healing: Secondary | ICD-10-CM | POA: Diagnosis not present

## 2021-01-14 DIAGNOSIS — R29898 Other symptoms and signs involving the musculoskeletal system: Secondary | ICD-10-CM | POA: Diagnosis not present

## 2021-01-15 DIAGNOSIS — R2681 Unsteadiness on feet: Secondary | ICD-10-CM | POA: Diagnosis not present

## 2021-01-15 DIAGNOSIS — S8254XD Nondisplaced fracture of medial malleolus of right tibia, subsequent encounter for closed fracture with routine healing: Secondary | ICD-10-CM | POA: Diagnosis not present

## 2021-01-15 DIAGNOSIS — R29898 Other symptoms and signs involving the musculoskeletal system: Secondary | ICD-10-CM | POA: Diagnosis not present

## 2021-01-15 DIAGNOSIS — Z9181 History of falling: Secondary | ICD-10-CM | POA: Diagnosis not present

## 2021-01-15 DIAGNOSIS — M6281 Muscle weakness (generalized): Secondary | ICD-10-CM | POA: Diagnosis not present

## 2021-01-17 DIAGNOSIS — S8254XD Nondisplaced fracture of medial malleolus of right tibia, subsequent encounter for closed fracture with routine healing: Secondary | ICD-10-CM | POA: Diagnosis not present

## 2021-01-17 DIAGNOSIS — Z9181 History of falling: Secondary | ICD-10-CM | POA: Diagnosis not present

## 2021-01-17 DIAGNOSIS — R29898 Other symptoms and signs involving the musculoskeletal system: Secondary | ICD-10-CM | POA: Diagnosis not present

## 2021-01-17 DIAGNOSIS — R2681 Unsteadiness on feet: Secondary | ICD-10-CM | POA: Diagnosis not present

## 2021-01-17 DIAGNOSIS — M6281 Muscle weakness (generalized): Secondary | ICD-10-CM | POA: Diagnosis not present

## 2021-01-19 DIAGNOSIS — Z9181 History of falling: Secondary | ICD-10-CM | POA: Diagnosis not present

## 2021-01-19 DIAGNOSIS — R2681 Unsteadiness on feet: Secondary | ICD-10-CM | POA: Diagnosis not present

## 2021-01-19 DIAGNOSIS — S8254XD Nondisplaced fracture of medial malleolus of right tibia, subsequent encounter for closed fracture with routine healing: Secondary | ICD-10-CM | POA: Diagnosis not present

## 2021-01-19 DIAGNOSIS — M6281 Muscle weakness (generalized): Secondary | ICD-10-CM | POA: Diagnosis not present

## 2021-01-19 DIAGNOSIS — R29898 Other symptoms and signs involving the musculoskeletal system: Secondary | ICD-10-CM | POA: Diagnosis not present

## 2021-01-20 DIAGNOSIS — S8254XD Nondisplaced fracture of medial malleolus of right tibia, subsequent encounter for closed fracture with routine healing: Secondary | ICD-10-CM | POA: Diagnosis not present

## 2021-01-20 DIAGNOSIS — R2681 Unsteadiness on feet: Secondary | ICD-10-CM | POA: Diagnosis not present

## 2021-01-20 DIAGNOSIS — Z9181 History of falling: Secondary | ICD-10-CM | POA: Diagnosis not present

## 2021-01-20 DIAGNOSIS — M6281 Muscle weakness (generalized): Secondary | ICD-10-CM | POA: Diagnosis not present

## 2021-01-20 DIAGNOSIS — R29898 Other symptoms and signs involving the musculoskeletal system: Secondary | ICD-10-CM | POA: Diagnosis not present

## 2021-01-20 DIAGNOSIS — I872 Venous insufficiency (chronic) (peripheral): Secondary | ICD-10-CM | POA: Diagnosis not present

## 2021-01-21 ENCOUNTER — Encounter: Payer: Self-pay | Admitting: Nurse Practitioner

## 2021-01-21 ENCOUNTER — Non-Acute Institutional Stay (SKILLED_NURSING_FACILITY): Payer: Medicare Other | Admitting: Nurse Practitioner

## 2021-01-21 DIAGNOSIS — D649 Anemia, unspecified: Secondary | ICD-10-CM | POA: Diagnosis not present

## 2021-01-21 DIAGNOSIS — N3942 Incontinence without sensory awareness: Secondary | ICD-10-CM

## 2021-01-21 DIAGNOSIS — I872 Venous insufficiency (chronic) (peripheral): Secondary | ICD-10-CM | POA: Diagnosis not present

## 2021-01-21 DIAGNOSIS — I10 Essential (primary) hypertension: Secondary | ICD-10-CM | POA: Diagnosis not present

## 2021-01-21 DIAGNOSIS — R29898 Other symptoms and signs involving the musculoskeletal system: Secondary | ICD-10-CM | POA: Diagnosis not present

## 2021-01-21 DIAGNOSIS — S8254XD Nondisplaced fracture of medial malleolus of right tibia, subsequent encounter for closed fracture with routine healing: Secondary | ICD-10-CM

## 2021-01-21 DIAGNOSIS — N1831 Chronic kidney disease, stage 3a: Secondary | ICD-10-CM

## 2021-01-21 DIAGNOSIS — R2681 Unsteadiness on feet: Secondary | ICD-10-CM | POA: Diagnosis not present

## 2021-01-21 DIAGNOSIS — M6281 Muscle weakness (generalized): Secondary | ICD-10-CM | POA: Diagnosis not present

## 2021-01-21 DIAGNOSIS — K5901 Slow transit constipation: Secondary | ICD-10-CM

## 2021-01-21 DIAGNOSIS — Z9181 History of falling: Secondary | ICD-10-CM | POA: Diagnosis not present

## 2021-01-21 NOTE — Assessment & Plan Note (Signed)
improved on Furosemide, 12/19/20 venous US LLE negative DVT

## 2021-01-21 NOTE — Assessment & Plan Note (Addendum)
Hgb 8.3 12/11/20 8.4 12/18/20 >>9.2 12/23/20>>7.2 01/20/21, will change to Pantoprazole form Omeprazole started for GI protection, FOBT negative x3, normal Fe, Vit B12, Folate. On Fe. Post op is contributory too. CBC/diff in am.  01/22/21 wbc 6.5, Hgb 8.0, plt 251, neutrophils 55.9.

## 2021-01-21 NOTE — Assessment & Plan Note (Signed)
Bun/creat 27/1.60, eGFR 31

## 2021-01-21 NOTE — Assessment & Plan Note (Signed)
12/23/20 R ankle fracture ( Fracture of the medial malleolus. Nondisplaced fracture of the distal fibula. S/p ORIF, splint 2 weeks, NWB

## 2021-01-21 NOTE — Assessment & Plan Note (Signed)
No constipation, taking Fibercon prn.

## 2021-01-21 NOTE — Assessment & Plan Note (Signed)
urinary frequency/leakage, uses pads, it seems better since increased Myrbetriq per Urology

## 2021-01-21 NOTE — Assessment & Plan Note (Signed)
blood pressure is controlled, on Valsartan 160mg  qd.

## 2021-01-21 NOTE — Progress Notes (Addendum)
Location:   [facility] Room Number: [place] of Service:  SNF (31) Provider:   X , NP  ,  X, NP  Patient Care Team: ,  X, NP as PCP - General (Internal Medicine)  Extended Emergency Contact Information Primary Emergency Contact: Santiago Phone: [phone] Work Phone: [phone] Relation: None Secondary Emergency Contact: Porada,[name] Address: [place],  [address] [name] [place] Phone: [phone] Work Phone: [phone] Mobile Phone: [phone] Relation: Daughter  Code Status:  DNR Goals of care: Advanced Directive information Advanced Directives 01/21/2021  Does Patient Have a Medical Advance Directive? Yes  Type of Paramedic of [place];Living will;Out of facility DNR (pink MOST or yellow form)  Does patient want to make changes to medical advance directive? No - Patient declined  Copy of [place] in Chart? Yes - validated most recent copy scanned in chart (See row information)  Pre-existing out of facility DNR order (yellow form or pink MOST form) Yellow form placed in chart (order not valid for inpatient use);Pink MOST form placed in chart (order not valid for inpatient use)     Chief Complaint  Patient presents with   Acute Visit    Patient presents for anemia    HPI:  Pt is a 87 y.o. female seen today for an acute visit for Hgb dropped to 7.2 01/20/21, the patient denied abd pain, nausea, vomiting, indigestion, blood in stool or urine. Post op 01/13/21  12/23/20 R ankle fracture ( Fracture of the medial malleolus. Nondisplaced fracture of the distal fibula. S/p ORIF, splint 2 weeks, NWB        Anemia, Hgb 8.3 12/11/20 8.4 12/18/20 >>9.2 12/23/20>>7.2 01/20/21, Omeprazole started for GI protection, FOBT negative x3, normal Fe, Vit B12, Folate. On Fe Edema BLE, improved on Furosemide, 12/19/20 venous US LLE negative  DVT CKD Bun/creat 27/1.60, eGFR 31             Her urinary frequency/leakage, uses pads, it seems better since increased Myrbetriq per Urology             HTN, blood pressure is controlled, on Valsartan 127m qd.             No constipation, taking Fibercon prn.    Past Medical History:  Diagnosis Date   Anemia    Chronic kidney disease    stage 3   GERD (gastroesophageal reflux disease)    Hypertension    Past Surgical History:  Procedure Laterality Date   ORIF ANKLE FRACTURE Right 01/13/2021   Procedure: OPEN TREATMENT OF RIGHT TRIMALLEOLAR ANKLE FRACTURE WITHOUT POSTERIOR FIXATION, POSSIBLE SYNDESMOSIS;  Surgeon: AErle Crocker MD;  Location: [facility]  Service: Orthopedics;  Laterality: Right;    Allergies  Allergen Reactions   Ace Inhibitors    Flagyl [Metronidazole]    Penicillins Swelling    facial    Allergies as of 01/21/2021       Reactions   Ace Inhibitors    Flagyl [metronidazole]    Penicillins Swelling   facial        Medication List        Accurate as of January 21, 2021 11:59 PM. If you have any questions, ask your nurse or doctor.          acetaminophen 325 MG tablet Commonly known as: TYLENOL Take 650 mg by mouth every 4 (four) hours as needed.  alendronate 70 MG tablet Commonly known as: Fosamax Take 1 tablet (70 mg total) by mouth every 7 (seven) days. Take with a full glass of water on an empty stomach.   Calcium Carb-Cholecalciferol 500-400 MG-UNIT Tabs Take 1 tablet by mouth daily.   cholecalciferol 25 MCG (1000 UNIT) tablet Commonly known as: VITAMIN D Take 2,000 Units by mouth daily.   ferrous sulfate 325 (65 FE) MG tablet Take 325 mg by mouth daily with breakfast. MON, WED, FRI   furosemide 20 MG tablet Commonly known as: LASIX Take 20 mg by mouth daily.   HYDROcodone-acetaminophen 5-325 MG tablet Commonly known as: Norco Take 1 tablet by mouth every 6 (six) hours as needed for moderate pain.   hydrocortisone cream  1 % Apply 1 application topically 2 (two) times daily.   ICAPS AREDS 2 PO Take by mouth 2 (two) times daily.   mirabegron ER 50 MG Tb24 tablet Commonly known as: MYRBETRIQ Take 50 mg by mouth daily.   omeprazole 20 MG capsule Commonly known as: PRILOSEC Take 20 mg by mouth daily.   polycarbophil 625 MG tablet Commonly known as: FIBERCON Take 625 mg by mouth as needed.   potassium chloride SA 20 MEQ tablet Commonly known as: KLOR-CON Take 20 mEq by mouth daily.   valsartan 160 MG tablet Commonly known as: DIOVAN TAKE 1 TABLET BY MOUTH  DAILY   vitamin C 250 MG tablet Commonly known as: ASCORBIC ACID Take 250 mg by mouth daily.        Review of Systems  Constitutional:  Negative for activity change, appetite change and fever.  HENT:  Positive for hearing loss. Negative for congestion and voice change.   Eyes:  Negative for visual disturbance.  Respiratory:  Negative for cough and shortness of breath.   Cardiovascular:  Positive for leg swelling.  Gastrointestinal:  Negative for abdominal pain and constipation.       No indigestion, upset stomach.   Genitourinary:  Positive for frequency. Negative for dysuria and urgency.       Occasionally incontinent of urine. Urination average every 4 hrs during day 1x/night.   Musculoskeletal:  Positive for gait problem.       Right lower leg/ankle in the splint.   Skin:  Positive for wound.       Left calf skin tear  Neurological:  Negative for speech difficulty, weakness and light-headedness.       Memory lapses.   Psychiatric/Behavioral:  Negative for behavioral problems and sleep disturbance. The patient is not nervous/anxious.    Immunization History  Administered Date(s) Administered   Fluad Quad(high Dose 65+) 03/28/2019   H1N1 03/18/2009   Influenza, High Dose Seasonal PF 03/25/2017, 03/16/2018   Influenza-Unspecified 02/25/2011, 03/31/2012, 03/27/2013, 03/14/2014, 03/25/2016, 06/14/2017   Moderna SARS-COV2 Booster  Vaccination 10/28/2020   Moderna Sars-Covid-2 Vaccination 06/18/2019, 07/16/2019   Pneumococcal Conjugate-13 09/10/2014   Pneumococcal Polysaccharide-23 11/03/2014   Pneumococcal-Unspecified 06/14/2017   Tdap 11/27/2019   Pertinent  Health Maintenance Due  Topic Date Due   INFLUENZA VACCINE  01/12/2021   DEXA SCAN  Completed   PNA vac Low Risk Adult  Completed   Fall Risk  12/04/2020 05/30/2020 11/23/2019 08/16/2019 05/03/2019  Falls in the past year? 0 0 0 0 0  Number falls in past yr: 0 0 0 0 0  Injury with Fall? 0 - - - -  Risk for fall due to : No Fall Risks - - - -  Follow up Falls evaluation completed - - - -  Functional Status Survey:    Vitals:   01/21/21 1416  BP: 122/60  Pulse: 72  Resp: 18  Temp: 98 F (36.7 C)  SpO2: 96%  Weight: 114 lb 12.8 oz (52.1 kg)  Height: 5' 4.5" (1.638 m)   Body mass index is 19.4 kg/m. Physical Exam Vitals reviewed.  Constitutional:      Appearance: Normal appearance.  HENT:     Head: Normocephalic and atraumatic.     Nose: Nose normal.     Mouth/Throat:     Mouth: Mucous membranes are moist.  Eyes:     Extraocular Movements: Extraocular movements intact.     Conjunctiva/sclera: Conjunctivae normal.     Pupils: Pupils are equal, round, and reactive to light.  Cardiovascular:     Rate and Rhythm: Normal rate and regular rhythm.     Heart sounds: No murmur heard. Pulmonary:     Breath sounds: No rales.  Abdominal:     General: Bowel sounds are normal.     Palpations: Abdomen is soft.     Tenderness: There is no abdominal tenderness.  Musculoskeletal:        General: Tenderness present.     Cervical back: Normal range of motion and neck supple.     Right lower leg: Edema present.     Left lower leg: Edema present.     Comments: Right ankle in splint, able to wiggle toes, minimal pain since s/p ORIF 01/13/21. Edema BLE 1+.   Skin:    General: Skin is warm and dry.     Findings: Bruising present.     Comments: Chronic  pigmented venous insufficiency skin changes BLE. Left calf skin tear is healing. Left forearm ecchymosis from phlebotomy   Neurological:     Mental Status: She is alert. Mental status is at baseline.     Gait: Gait abnormal.     Comments: Oriented to person, place.   Psychiatric:        Mood and Affect: Mood normal.        Behavior: Behavior normal.        Thought Content: Thought content normal.        Judgment: Judgment normal.    Labs reviewed: Recent Labs    11/20/20 0730 12/08/20 0828 12/20/20 0000 12/23/20 0000 01/13/21 0741  NA 138 136 139 136* 135  K 4.2 4.6 4.5 5.2 3.9  CL 102 102 104 101 100  CO2 26 22 25* 20 26  GLUCOSE 78 79  --   --  85  BUN 44* 42* 29* 41* 23  CREATININE 1.89* 1.92* 1.9* 1.7* 1.57*  CALCIUM 10.4 10.3 9.1 9.8 9.4   Recent Labs    11/20/20 0730 12/08/20 0828 12/20/20 0000 12/23/20 0000 01/13/21 0741  AST _0 ALT _1 ALKPHOS  --   --  52 57 63  BILITOT 0.5 0.4  --   --  0.7  PROT 6.7 6.7  --   --  6.0*  ALBUMIN  --   --  4.2 4.2 3.3*   Recent Labs    11/20/20 0730 12/08/20 0828 12/20/20 0000 12/23/20 0000 01/13/21 0741  WBC 7.3 6.5 7.9 6.7 5.9  NEUTROABS 3,614 3,244 4,795.00 3,518.00  --   HGB 9.0* 8.6* 8.4* 9.2* 8.0*  HCT 27.1* 26.6* 25* 27* 25.5*  MCV 96.1 98.5  --   --  100.8*  PLT 228 180 257 309 241   Lab Results  Component Value Date   TSH 2.48 11/20/2020   No results found for: HGBA1C Lab Results  Component Value Date   CHOL 159 08/16/2017   HDL 73 08/16/2017   LDLCALC 73 08/16/2017   TRIG 53 08/16/2017   CHOLHDL 2.2 08/16/2017    Significant Diagnostic Results in last 30 days:  DG Tibia/Fibula Left  Result Date: 12/24/2020 CLINICAL DATA:  Skin tear posterior left leg.  Status post fall. EXAM: LEFT TIBIA AND FIBULA - 2 VIEW COMPARISON:  None. FINDINGS: Bones are diffusely demineralized. No evidence for an acute fracture. Defect in the posterior soft tissues of the medial leg is  compatible with the reported clinical history. No evidence for retained radiopaque soft tissue foreign body. IMPRESSION: Soft tissue injury without evidence for underlying bony abnormality or retained radiopaque soft tissue foreign body. Electronically Signed   By: Misty Stanley M.D.   On: 12/24/2020 12:11   DG Ankle Complete Right  Result Date: 01/13/2021 CLINICAL DATA:  Surgery. Open treatment of a right trimalleolar fracture. EXAM: RIGHT ANKLE - COMPLETE 3+ VIEW; DG C-ARM 1-60 MIN COMPARISON:  November 24, 2020. FINDINGS: Radiation Exposure Index (as provided by the fluoroscopic device): 1.86 mGy Fluoroscopy Time:  1 minutes and 6 seconds. Number of Acquired Images:  5 5 C-arm fluoroscopic images were obtained intraoperatively and submitted for post operative interpretation. These images demonstrate intramedullary rod and screw fixation of the distal fibula with a single screw crossing into the adjacent tibia. Also, 2 screw fixation of the medial malleolus. Alignment appears near anatomic. Please see the performing provider's procedural report for further detail. IMPRESSION: Intraoperative fluoroscopy, as detailed above. Electronically Signed   By: Margaretha Sheffield MD   On: 01/13/2021 14:03   DG Ankle Complete Right  Result Date: 12/24/2020 CLINICAL DATA:  Fall with pain and deformity. EXAM: RIGHT ANKLE - COMPLETE 3+ VIEW COMPARISON:  None. FINDINGS: Acute transverse fracture of the medial malleolus. Nondisplaced fracture of the distal fibula. No definite fracture of the posterior lip of the tibia. IMPRESSION: Fracture of the medial malleolus. Nondisplaced fracture of the distal fibula. I think the posterior lip of the tibia is intact. No widening of the ankle mortise. Electronically Signed   By: Nelson Chimes M.D.   On: 12/24/2020 12:20   DG C-Arm 1-60 Min  Result Date: 01/13/2021 CLINICAL DATA:  Surgery. Open treatment of a right trimalleolar fracture. EXAM: RIGHT ANKLE - COMPLETE 3+ VIEW; DG C-ARM 1-60  MIN COMPARISON:  November 24, 2020. FINDINGS: Radiation Exposure Index (as provided by the fluoroscopic device): 1.86 mGy Fluoroscopy Time:  1 minutes and 6 seconds. Number of Acquired Images:  5 5 C-arm fluoroscopic images were obtained intraoperatively and submitted for post operative interpretation. These images demonstrate intramedullary rod and screw fixation of the distal fibula with a single screw crossing into the adjacent tibia. Also, 2 screw fixation of the medial malleolus. Alignment appears near anatomic. Please see the performing provider's procedural report for further detail. IMPRESSION: Intraoperative fluoroscopy, as detailed above. Electronically Signed   By: Margaretha Sheffield MD   On: 01/13/2021 14:03    Assessment/Plan Anemia Hgb 8.3 12/11/20 8.4 12/18/20 >>9.2 12/23/20>>7.2 01/20/21, will change to Pantoprazole form Omeprazole started for GI protection, FOBT negative x3, normal Fe, Vit B12, Folate. On Fe. Post op is contributory too. CBC/diff in am.  01/22/21 wbc 6.5, Hgb 8.0, plt 251, neutrophils 55.9.   Closed nondisp fracture of right medial malleolus with routine healing 12/23/20 R ankle fracture ( Fracture of the  medial malleolus. Nondisplaced fracture of the distal fibula. S/p ORIF, splint 2 weeks, NWB  Venous insufficiency (chronic) (peripheral) improved on Furosemide, 12/19/20 venous US LLE negative DVT  CKD (chronic kidney disease) stage 3, GFR 30-59 ml/min (HCC) Bun/creat 27/1.60, eGFR 31  Incontinent of urine urinary frequency/leakage, uses pads, it seems better since increased Myrbetriq per Urology  Hypertension blood pressure is controlled, on Valsartan 122m qd.  Slow transit constipation No constipation, taking Fibercon prn.     Family/ staff Communication: plan of care reviewed with the patient and charge nurse.   Labs/tests ordered:  CBC/diff in am  Time spend 35 minutes.

## 2021-01-22 ENCOUNTER — Encounter: Payer: Self-pay | Admitting: Nurse Practitioner

## 2021-01-22 DIAGNOSIS — D6489 Other specified anemias: Secondary | ICD-10-CM | POA: Diagnosis not present

## 2021-01-22 LAB — CBC AND DIFFERENTIAL
HCT: 24 — AB (ref 36–46)
Hemoglobin: 8 — AB (ref 12.0–16.0)
Neutrophils Absolute: 3634
Platelets: 251 (ref 150–399)
WBC: 6.5

## 2021-01-22 LAB — CBC: RBC: 2.52 — AB (ref 3.87–5.11)

## 2021-01-23 DIAGNOSIS — M6281 Muscle weakness (generalized): Secondary | ICD-10-CM | POA: Diagnosis not present

## 2021-01-23 DIAGNOSIS — S8254XD Nondisplaced fracture of medial malleolus of right tibia, subsequent encounter for closed fracture with routine healing: Secondary | ICD-10-CM | POA: Diagnosis not present

## 2021-01-23 DIAGNOSIS — R29898 Other symptoms and signs involving the musculoskeletal system: Secondary | ICD-10-CM | POA: Diagnosis not present

## 2021-01-23 DIAGNOSIS — Z9181 History of falling: Secondary | ICD-10-CM | POA: Diagnosis not present

## 2021-01-23 DIAGNOSIS — R2681 Unsteadiness on feet: Secondary | ICD-10-CM | POA: Diagnosis not present

## 2021-01-25 DIAGNOSIS — S8254XD Nondisplaced fracture of medial malleolus of right tibia, subsequent encounter for closed fracture with routine healing: Secondary | ICD-10-CM | POA: Diagnosis not present

## 2021-01-25 DIAGNOSIS — Z9181 History of falling: Secondary | ICD-10-CM | POA: Diagnosis not present

## 2021-01-25 DIAGNOSIS — R29898 Other symptoms and signs involving the musculoskeletal system: Secondary | ICD-10-CM | POA: Diagnosis not present

## 2021-01-25 DIAGNOSIS — R2681 Unsteadiness on feet: Secondary | ICD-10-CM | POA: Diagnosis not present

## 2021-01-25 DIAGNOSIS — M6281 Muscle weakness (generalized): Secondary | ICD-10-CM | POA: Diagnosis not present

## 2021-01-27 DIAGNOSIS — R2681 Unsteadiness on feet: Secondary | ICD-10-CM | POA: Diagnosis not present

## 2021-01-27 DIAGNOSIS — S8254XD Nondisplaced fracture of medial malleolus of right tibia, subsequent encounter for closed fracture with routine healing: Secondary | ICD-10-CM | POA: Diagnosis not present

## 2021-01-27 DIAGNOSIS — M6281 Muscle weakness (generalized): Secondary | ICD-10-CM | POA: Diagnosis not present

## 2021-01-27 DIAGNOSIS — Z9181 History of falling: Secondary | ICD-10-CM | POA: Diagnosis not present

## 2021-01-27 DIAGNOSIS — R29898 Other symptoms and signs involving the musculoskeletal system: Secondary | ICD-10-CM | POA: Diagnosis not present

## 2021-01-28 DIAGNOSIS — Z9181 History of falling: Secondary | ICD-10-CM | POA: Diagnosis not present

## 2021-01-28 DIAGNOSIS — R2681 Unsteadiness on feet: Secondary | ICD-10-CM | POA: Diagnosis not present

## 2021-01-28 DIAGNOSIS — R29898 Other symptoms and signs involving the musculoskeletal system: Secondary | ICD-10-CM | POA: Diagnosis not present

## 2021-01-28 DIAGNOSIS — S82851A Displaced trimalleolar fracture of right lower leg, initial encounter for closed fracture: Secondary | ICD-10-CM | POA: Diagnosis not present

## 2021-01-28 DIAGNOSIS — S8254XD Nondisplaced fracture of medial malleolus of right tibia, subsequent encounter for closed fracture with routine healing: Secondary | ICD-10-CM | POA: Diagnosis not present

## 2021-01-28 DIAGNOSIS — M6281 Muscle weakness (generalized): Secondary | ICD-10-CM | POA: Diagnosis not present

## 2021-01-30 DIAGNOSIS — M6281 Muscle weakness (generalized): Secondary | ICD-10-CM | POA: Diagnosis not present

## 2021-01-30 DIAGNOSIS — R29898 Other symptoms and signs involving the musculoskeletal system: Secondary | ICD-10-CM | POA: Diagnosis not present

## 2021-01-30 DIAGNOSIS — R2681 Unsteadiness on feet: Secondary | ICD-10-CM | POA: Diagnosis not present

## 2021-01-30 DIAGNOSIS — Z9181 History of falling: Secondary | ICD-10-CM | POA: Diagnosis not present

## 2021-01-30 DIAGNOSIS — S8254XD Nondisplaced fracture of medial malleolus of right tibia, subsequent encounter for closed fracture with routine healing: Secondary | ICD-10-CM | POA: Diagnosis not present

## 2021-02-01 DIAGNOSIS — R29898 Other symptoms and signs involving the musculoskeletal system: Secondary | ICD-10-CM | POA: Diagnosis not present

## 2021-02-01 DIAGNOSIS — S8254XD Nondisplaced fracture of medial malleolus of right tibia, subsequent encounter for closed fracture with routine healing: Secondary | ICD-10-CM | POA: Diagnosis not present

## 2021-02-01 DIAGNOSIS — R2681 Unsteadiness on feet: Secondary | ICD-10-CM | POA: Diagnosis not present

## 2021-02-01 DIAGNOSIS — M6281 Muscle weakness (generalized): Secondary | ICD-10-CM | POA: Diagnosis not present

## 2021-02-01 DIAGNOSIS — Z9181 History of falling: Secondary | ICD-10-CM | POA: Diagnosis not present

## 2021-02-02 ENCOUNTER — Encounter: Payer: Self-pay | Admitting: Nurse Practitioner

## 2021-02-02 ENCOUNTER — Non-Acute Institutional Stay (SKILLED_NURSING_FACILITY): Payer: Medicare Other | Admitting: Nurse Practitioner

## 2021-02-02 DIAGNOSIS — S60222A Contusion of left hand, initial encounter: Secondary | ICD-10-CM

## 2021-02-02 DIAGNOSIS — M8000XD Age-related osteoporosis with current pathological fracture, unspecified site, subsequent encounter for fracture with routine healing: Secondary | ICD-10-CM

## 2021-02-02 DIAGNOSIS — R2681 Unsteadiness on feet: Secondary | ICD-10-CM | POA: Diagnosis not present

## 2021-02-02 DIAGNOSIS — I10 Essential (primary) hypertension: Secondary | ICD-10-CM

## 2021-02-02 DIAGNOSIS — M6281 Muscle weakness (generalized): Secondary | ICD-10-CM | POA: Diagnosis not present

## 2021-02-02 DIAGNOSIS — D649 Anemia, unspecified: Secondary | ICD-10-CM | POA: Diagnosis not present

## 2021-02-02 DIAGNOSIS — N1831 Chronic kidney disease, stage 3a: Secondary | ICD-10-CM

## 2021-02-02 DIAGNOSIS — N3942 Incontinence without sensory awareness: Secondary | ICD-10-CM

## 2021-02-02 DIAGNOSIS — I872 Venous insufficiency (chronic) (peripheral): Secondary | ICD-10-CM

## 2021-02-02 DIAGNOSIS — S8254XD Nondisplaced fracture of medial malleolus of right tibia, subsequent encounter for closed fracture with routine healing: Secondary | ICD-10-CM

## 2021-02-02 DIAGNOSIS — Z9181 History of falling: Secondary | ICD-10-CM | POA: Diagnosis not present

## 2021-02-02 DIAGNOSIS — R29898 Other symptoms and signs involving the musculoskeletal system: Secondary | ICD-10-CM | POA: Diagnosis not present

## 2021-02-02 NOTE — Progress Notes (Addendum)
Location:   [facility] Room Number: [place] of Service:  SNF (31) Provider:  Anfernee Peschke X Tasheena Wambolt, NP  Kyair Ditommaso X, NP  Patient Care Team: Layson Bertsch X, NP as PCP - General (Internal Medicine)  Extended Emergency Contact Information Primary Emergency Contact: [place] Phone: [phone] Work Phone: [phone] Relation: None Secondary Emergency Contact: Troop,[name] Address: [place], [place] [address] [name] of [place] Phone: [phone] Work Phone: [phone] Mobile Phone: [phone] Relation: Daughter  Code Status:  DNR Goals of care: Advanced Directive information Advanced Directives 02/02/2021  Does Patient Have a Medical Advance Directive? Yes  Type of Paramedic of [place];Living will;Out of facility DNR (pink MOST or yellow form)  Does patient want to make changes to medical advance directive? No - Patient declined  Copy of [place] in Chart? Yes - validated most recent copy scanned in chart (See row information)  Pre-existing out of facility DNR order (yellow form or pink MOST form) Yellow form placed in chart (order not valid for inpatient use);Pink MOST form placed in chart (order not valid for inpatient use)     Chief Complaint  Patient presents with   Acute Visit    Patient presents after a fall with hand pain    HPI:  Pt is a 87 y.o. female seen today for an acute visit for left hand contusion sustained from a fall when the patient lost balance during transfer in bathroom    12/23/20 R ankle fracture ( Fracture of the medial malleolus. Nondisplaced fracture of the distal fibula. S/p ORIF, in splint, NWB Anemia, Hgb 7.2 01/20/21>>8.0 01/22/21, on Pantoprazole for GI protection, FOBT negative x3, normal Fe, Vit B12, Folate. On Fe Edema BLE, improved on Furosemide, 12/19/20 venous US LLE negative DVT CKD Bun/creat 27/1.60, eGFR 31              Her urinary frequency/leakage, uses pads, it seems better since increased Myrbetriq per Urology             HTN, blood pressure is controlled, on Valsartan 112m qd.             No constipation, taking Fibercon prn.    Osteoporosis, takes Alendronate.   Past Medical History:  Diagnosis Date   Anemia    Chronic kidney disease    stage 3   GERD (gastroesophageal reflux disease)    Hypertension    Past Surgical History:  Procedure Laterality Date   ORIF ANKLE FRACTURE Right 01/13/2021   Procedure: OPEN TREATMENT OF RIGHT TRIMALLEOLAR ANKLE FRACTURE WITHOUT POSTERIOR FIXATION, POSSIBLE SYNDESMOSIS;  Surgeon: AErle Crocker MD;  Location: [facility]  Service: Orthopedics;  Laterality: Right;    Allergies  Allergen Reactions   Ace Inhibitors    Flagyl [Metronidazole]    Penicillins Swelling    facial    Allergies as of 02/02/2021       Reactions   Ace Inhibitors    Flagyl [metronidazole]    Penicillins Swelling   facial        Medication List        Accurate as of February 02, 2021 11:59 PM. If you have any questions, ask your nurse or doctor.          STOP taking these medications    HYDROcodone-acetaminophen 5-325 MG tablet Commonly known as: Norco Stopped by: Derward Marple X Quanta Roher, NP   hydrocortisone cream 1 %  Stopped by: Trystan Akhtar X Aashish Hamm, NP   omeprazole 20 MG capsule Commonly known as: PRILOSEC Stopped by: Aleeza [place] X Rozanne Heumann, NP       TAKE these medications    acetaminophen 325 MG tablet Commonly known as: TYLENOL Take 650 mg by mouth every 4 (four) hours as needed.   alendronate 70 MG tablet Commonly known as: Fosamax Take 1 tablet (70 mg total) by mouth every 7 (seven) days. Take with a full glass of water on an empty stomach.   Calcium Carb-Cholecalciferol 500-400 MG-UNIT Tabs Take 1 tablet by mouth daily.   cholecalciferol 25 MCG (1000 UNIT) tablet Commonly known as: VITAMIN D Take 2,000 Units by mouth daily.   ferrous sulfate 325 (65 FE) MG tablet Take 325  mg by mouth daily with breakfast. MON, WED, FRI   furosemide 20 MG tablet Commonly known as: LASIX Take 20 mg by mouth daily.   ICAPS AREDS 2 PO Take by mouth 2 (two) times daily.   mirabegron ER 50 MG Tb24 tablet Commonly known as: MYRBETRIQ Take 50 mg by mouth daily.   nystatin powder Commonly known as: MYCOSTATIN/NYSTOP Apply 1 application topically 2 (two) times daily.   pantoprazole 40 MG tablet Commonly known as: PROTONIX Take 40 mg by mouth daily.   polycarbophil 625 MG tablet Commonly known as: FIBERCON Take 625 mg by mouth as needed.   potassium chloride SA 20 MEQ tablet Commonly known as: KLOR-CON Take 20 mEq by mouth daily.   valsartan 160 MG tablet Commonly known as: DIOVAN TAKE 1 TABLET BY MOUTH  DAILY   vitamin C 250 MG tablet Commonly known as: ASCORBIC ACID Take 250 mg by mouth daily.        Review of Systems  Constitutional:  Negative for activity change, appetite change and fever.  HENT:  Positive for hearing loss. Negative for congestion and voice change.   Eyes:  Negative for visual disturbance.  Respiratory:  Negative for cough and shortness of breath.   Cardiovascular:  Positive for leg swelling.  Gastrointestinal:  Negative for abdominal pain and constipation.       No indigestion, upset stomach.   Genitourinary:  Positive for frequency. Negative for dysuria and urgency.       Occasionally incontinent of urine. Urination average every 4 hrs during day 1x/night.   Musculoskeletal:  Positive for arthralgias, gait problem and joint swelling.       Right lower leg/ankle in the splint.   Skin:  Positive for wound.       Left calf skin tear  Neurological:  Negative for speech difficulty, weakness and light-headedness.       Memory lapses.   Psychiatric/Behavioral:  Negative for behavioral problems and sleep disturbance. The patient is not nervous/anxious.    Immunization History  Administered Date(s) Administered   Fluad Quad(high Dose  65+) 03/28/2019   H1N1 03/18/2009   Influenza, High Dose Seasonal PF 03/25/2017, 03/16/2018   Influenza-Unspecified 02/25/2011, 03/31/2012, 03/27/2013, 03/14/2014, 03/25/2016, 06/14/2017   Moderna SARS-COV2 Booster Vaccination 10/28/2020   Moderna Sars-Covid-2 Vaccination 06/18/2019, 07/16/2019   Pneumococcal Conjugate-13 09/10/2014   Pneumococcal Polysaccharide-23 11/03/2014   Pneumococcal-Unspecified 06/14/2017   Tdap 11/27/2019   Pertinent  Health Maintenance Due  Topic Date Due   INFLUENZA VACCINE  01/12/2021   DEXA SCAN  Completed   PNA vac Low Risk Adult  Completed   Fall Risk  12/04/2020 05/30/2020 11/23/2019 08/16/2019 05/03/2019  Falls in the past year? 0 0 0 0 0  Number falls in  past yr: 0 0 0 0 0  Injury with Fall? 0 - - - -  Risk for fall due to : No Fall Risks - - - -  Follow up Falls evaluation completed - - - -   Functional Status Survey:    Vitals:   02/02/21 1604  BP: (!) 140/92  Pulse: 76  Resp: 18  Temp: 98.4 F (36.9 C)  SpO2: 94%  Weight: 118 lb 11.2 oz (53.8 kg)  Height: 5' 4.5" (1.638 m)   Body mass index is 20.06 kg/m. Physical Exam Vitals reviewed.  Constitutional:      Appearance: Normal appearance.  HENT:     Head: Normocephalic and atraumatic.     Nose: Nose normal.     Mouth/Throat:     Mouth: Mucous membranes are moist.  Eyes:     Extraocular Movements: Extraocular movements intact.     Conjunctiva/sclera: Conjunctivae normal.     Pupils: Pupils are equal, round, and reactive to light.  Cardiovascular:     Rate and Rhythm: Normal rate and regular rhythm.     Heart sounds: No murmur heard. Pulmonary:     Breath sounds: No rales.  Abdominal:     General: Bowel sounds are normal.     Palpations: Abdomen is soft.     Tenderness: no abdominal tenderness  Musculoskeletal:        General: Tenderness present.     Cervical back: Normal range of motion and neck supple.     Right lower leg: Edema present.     Left lower leg: Edema  present.     Comments: Right ankle in splint, able to wiggle toes, minimal pain since s/p ORIF 01/13/21. Edema BLE 1+.   Skin:    General: Skin is warm and dry.     Findings: Bruising present.     Comments: Chronic pigmented venous insufficiency skin changes BLE. Left calf skin tear is healing. Swelling, pain, bruise of the ulnar aspect of the left palm  Neurological:     Mental Status: She is alert. Mental status is at baseline.     Gait: Gait abnormal.     Comments: Oriented to person, place.   Psychiatric:        Mood and Affect: Mood normal.        Behavior: Behavior normal.        Thought Content: Thought content normal.    Labs reviewed: Recent Labs    11/20/20 0730 12/08/20 0828 12/20/20 0000 12/23/20 0000 01/13/21 0741  NA 138 136 139 136* 135  K 4.2 4.6 4.5 5.2 3.9  CL 102 102 104 101 100  CO2 26 22 25* 20 26  GLUCOSE 78 79  --   --  85  BUN 44* 42* 29* 41* 23  CREATININE 1.89* 1.92* 1.9* 1.7* 1.57*  CALCIUM 10.4 10.3 9.1 9.8 9.4   Recent Labs    11/20/20 0730 12/08/20 0828 12/20/20 0000 12/23/20 0000 01/13/21 0741  AST _0 ALT _1 ALKPHOS  --   --  52 57 63  BILITOT 0.5 0.4  --   --  0.7  PROT 6.7 6.7  --   --  6.0*  ALBUMIN  --   --  4.2 4.2 3.3*   Recent Labs    11/20/20 0730 12/08/20 0828 12/20/20 0000 12/23/20 0000 01/13/21 0741  WBC 7.3 6.5 7.9 6.7 5.9  NEUTROABS 3,614 3,244 4,795.00 3,518.00  --  HGB 9.0* 8.6* 8.4* 9.2* 8.0*  HCT 27.1* 26.6* 25* 27* 25.5*  MCV 96.1 98.5  --   --  100.8*  PLT 228 180 257 309 241   Lab Results  Component Value Date   TSH 2.48 11/20/2020   No results found for: HGBA1C Lab Results  Component Value Date   CHOL 159 08/16/2017   HDL 73 08/16/2017   LDLCALC 73 08/16/2017   TRIG 53 08/16/2017   CHOLHDL 2.2 08/16/2017    Significant Diagnostic Results in last 30 days:  DG Ankle Complete Right  Result Date: 01/13/2021 CLINICAL DATA:  Surgery. Open treatment of a right  trimalleolar fracture. EXAM: RIGHT ANKLE - COMPLETE 3+ VIEW; DG C-ARM 1-60 MIN COMPARISON:  November 24, 2020. FINDINGS: Radiation Exposure Index (as provided by the fluoroscopic device): 1.86 mGy Fluoroscopy Time:  1 minutes and 6 seconds. Number of Acquired Images:  5 5 C-arm fluoroscopic images were obtained intraoperatively and submitted for post operative interpretation. These images demonstrate intramedullary rod and screw fixation of the distal fibula with a single screw crossing into the adjacent tibia. Also, 2 screw fixation of the medial malleolus. Alignment appears near anatomic. Please see the performing provider's procedural report for further detail. IMPRESSION: Intraoperative fluoroscopy, as detailed above. Electronically Signed   By: Margaretha Sheffield MD   On: 01/13/2021 14:03   DG C-Arm 1-60 Min  Result Date: 01/13/2021 CLINICAL DATA:  Surgery. Open treatment of a right trimalleolar fracture. EXAM: RIGHT ANKLE - COMPLETE 3+ VIEW; DG C-ARM 1-60 MIN COMPARISON:  November 24, 2020. FINDINGS: Radiation Exposure Index (as provided by the fluoroscopic device): 1.86 mGy Fluoroscopy Time:  1 minutes and 6 seconds. Number of Acquired Images:  5 5 C-arm fluoroscopic images were obtained intraoperatively and submitted for post operative interpretation. These images demonstrate intramedullary rod and screw fixation of the distal fibula with a single screw crossing into the adjacent tibia. Also, 2 screw fixation of the medial malleolus. Alignment appears near anatomic. Please see the performing provider's procedural report for further detail. IMPRESSION: Intraoperative fluoroscopy, as detailed above. Electronically Signed   By: Margaretha Sheffield MD   On: 01/13/2021 14:03    Assessment/Plan Boxer's fracture Bruise, swelling, painful ulna aspect of the left palm, X-ray 02/03/21 showed metacarpal fracture with angulation. F/u Ortho.   Closed nondisp fracture of right medial malleolus with routine healing 12/23/20 R  ankle fracture ( Fracture of the medial malleolus. Nondisplaced fracture of the distal fibula. S/p ORIF, in splint, NWB  Anemia Hgb 7.2 01/20/21<<8.0 01/22/21, on Pantoprazole for GI protection, FOBT negative x3, normal Fe, Vit B12, Folate. On Fe. Update CBC/diff.   Venous insufficiency (chronic) (peripheral) Edema BLE, improved on Furosemide, 12/19/20 venous US LLE negative DVT  CKD (chronic kidney disease) stage 3, GFR 30-59 ml/min (HCC) CKD Bun/creat 27/1.60, eGFR 31  Incontinent of urine Her urinary frequency/leakage, uses pads, it seems better since increased Myrbetriq per Urology  Hypertension blood pressure is mildly elevated today, probably related to fall and left hand pain, on Valsartan 149m qd. Observe.   Osteoporosis takes Alendronate.     Family/ staff Communication: plan of care reviewed with the patient, the patient's daughter, and charge nurse.   Labs/tests ordered:  X-ray left hand 3 views. CBC/diff  Time spend 35 minutes.

## 2021-02-03 DIAGNOSIS — R29898 Other symptoms and signs involving the musculoskeletal system: Secondary | ICD-10-CM | POA: Diagnosis not present

## 2021-02-03 DIAGNOSIS — S8254XD Nondisplaced fracture of medial malleolus of right tibia, subsequent encounter for closed fracture with routine healing: Secondary | ICD-10-CM | POA: Diagnosis not present

## 2021-02-03 DIAGNOSIS — M6281 Muscle weakness (generalized): Secondary | ICD-10-CM | POA: Diagnosis not present

## 2021-02-03 DIAGNOSIS — M79642 Pain in left hand: Secondary | ICD-10-CM | POA: Diagnosis not present

## 2021-02-03 DIAGNOSIS — S62301A Unspecified fracture of second metacarpal bone, left hand, initial encounter for closed fracture: Secondary | ICD-10-CM | POA: Diagnosis not present

## 2021-02-03 DIAGNOSIS — Z9181 History of falling: Secondary | ICD-10-CM | POA: Diagnosis not present

## 2021-02-03 DIAGNOSIS — R2681 Unsteadiness on feet: Secondary | ICD-10-CM | POA: Diagnosis not present

## 2021-02-04 ENCOUNTER — Encounter: Payer: Self-pay | Admitting: Nurse Practitioner

## 2021-02-04 DIAGNOSIS — S8254XD Nondisplaced fracture of medial malleolus of right tibia, subsequent encounter for closed fracture with routine healing: Secondary | ICD-10-CM | POA: Diagnosis not present

## 2021-02-04 DIAGNOSIS — S62339A Displaced fracture of neck of unspecified metacarpal bone, initial encounter for closed fracture: Secondary | ICD-10-CM | POA: Insufficient documentation

## 2021-02-04 DIAGNOSIS — R29898 Other symptoms and signs involving the musculoskeletal system: Secondary | ICD-10-CM | POA: Diagnosis not present

## 2021-02-04 DIAGNOSIS — R2681 Unsteadiness on feet: Secondary | ICD-10-CM | POA: Diagnosis not present

## 2021-02-04 DIAGNOSIS — S60222A Contusion of left hand, initial encounter: Secondary | ICD-10-CM | POA: Insufficient documentation

## 2021-02-04 DIAGNOSIS — S62307A Unspecified fracture of fifth metacarpal bone, left hand, initial encounter for closed fracture: Secondary | ICD-10-CM | POA: Diagnosis not present

## 2021-02-04 DIAGNOSIS — M6281 Muscle weakness (generalized): Secondary | ICD-10-CM | POA: Diagnosis not present

## 2021-02-04 DIAGNOSIS — Z9181 History of falling: Secondary | ICD-10-CM | POA: Diagnosis not present

## 2021-02-04 NOTE — Assessment & Plan Note (Signed)
Bruise, swelling, painful ulna aspect of the left palm, X-ray 02/03/21 showed metacarpal fracture with angulation. F/u Ortho.

## 2021-02-04 NOTE — Assessment & Plan Note (Signed)
12/23/20 R ankle fracture ( Fracture of the medial malleolus. Nondisplaced fracture of the distal fibula. S/p ORIF, in splint, NWB

## 2021-02-04 NOTE — Assessment & Plan Note (Signed)
CKD Bun/creat 27/1.60, eGFR 31

## 2021-02-04 NOTE — Assessment & Plan Note (Signed)
takes Alendronate.

## 2021-02-04 NOTE — Assessment & Plan Note (Addendum)
blood pressure is mildly elevated today, probably related to fall and left hand pain, on Valsartan 160mg  qd. Observe.

## 2021-02-04 NOTE — Assessment & Plan Note (Signed)
Edema BLE, improved on Furosemide, 12/19/20 venous US LLE negative DVT

## 2021-02-04 NOTE — Assessment & Plan Note (Addendum)
Hgb 7.2 01/20/21<<8.0 01/22/21, on Pantoprazole for GI protection, FOBT negative x3, normal Fe, Vit B12, Folate. On Fe. Update CBC/diff. 02/05/21 wbc 5.7, Hgb 7.7, plt 212, neutrophils 59.4, CBC one wk

## 2021-02-04 NOTE — Assessment & Plan Note (Signed)
Her urinary frequency/leakage, uses pads, it seems better since increased Myrbetriq per Urology

## 2021-02-05 ENCOUNTER — Encounter: Payer: Self-pay | Admitting: Nurse Practitioner

## 2021-02-05 DIAGNOSIS — R29898 Other symptoms and signs involving the musculoskeletal system: Secondary | ICD-10-CM | POA: Diagnosis not present

## 2021-02-05 DIAGNOSIS — R2681 Unsteadiness on feet: Secondary | ICD-10-CM | POA: Diagnosis not present

## 2021-02-05 DIAGNOSIS — D6489 Other specified anemias: Secondary | ICD-10-CM | POA: Diagnosis not present

## 2021-02-05 DIAGNOSIS — Z9181 History of falling: Secondary | ICD-10-CM | POA: Diagnosis not present

## 2021-02-05 DIAGNOSIS — M6281 Muscle weakness (generalized): Secondary | ICD-10-CM | POA: Diagnosis not present

## 2021-02-05 DIAGNOSIS — S8254XD Nondisplaced fracture of medial malleolus of right tibia, subsequent encounter for closed fracture with routine healing: Secondary | ICD-10-CM | POA: Diagnosis not present

## 2021-02-05 DIAGNOSIS — I10 Essential (primary) hypertension: Secondary | ICD-10-CM | POA: Diagnosis not present

## 2021-02-05 LAB — CBC AND DIFFERENTIAL
HCT: 24 — AB (ref 36–46)
Hemoglobin: 7.7 — AB (ref 12.0–16.0)
Neutrophils Absolute: 3386
Platelets: 212 (ref 150–399)
WBC: 5.7

## 2021-02-05 LAB — CBC: RBC: 2.44 — AB (ref 3.87–5.11)

## 2021-02-05 NOTE — Progress Notes (Signed)
This encounter was created in error - please disregard.

## 2021-02-06 DIAGNOSIS — R2681 Unsteadiness on feet: Secondary | ICD-10-CM | POA: Diagnosis not present

## 2021-02-06 DIAGNOSIS — M6281 Muscle weakness (generalized): Secondary | ICD-10-CM | POA: Diagnosis not present

## 2021-02-06 DIAGNOSIS — Z9181 History of falling: Secondary | ICD-10-CM | POA: Diagnosis not present

## 2021-02-06 DIAGNOSIS — S8254XD Nondisplaced fracture of medial malleolus of right tibia, subsequent encounter for closed fracture with routine healing: Secondary | ICD-10-CM | POA: Diagnosis not present

## 2021-02-06 DIAGNOSIS — R29898 Other symptoms and signs involving the musculoskeletal system: Secondary | ICD-10-CM | POA: Diagnosis not present

## 2021-02-09 DIAGNOSIS — Z9181 History of falling: Secondary | ICD-10-CM | POA: Diagnosis not present

## 2021-02-09 DIAGNOSIS — M6281 Muscle weakness (generalized): Secondary | ICD-10-CM | POA: Diagnosis not present

## 2021-02-09 DIAGNOSIS — S8254XD Nondisplaced fracture of medial malleolus of right tibia, subsequent encounter for closed fracture with routine healing: Secondary | ICD-10-CM | POA: Diagnosis not present

## 2021-02-09 DIAGNOSIS — R2681 Unsteadiness on feet: Secondary | ICD-10-CM | POA: Diagnosis not present

## 2021-02-09 DIAGNOSIS — R29898 Other symptoms and signs involving the musculoskeletal system: Secondary | ICD-10-CM | POA: Diagnosis not present

## 2021-02-10 DIAGNOSIS — M6281 Muscle weakness (generalized): Secondary | ICD-10-CM | POA: Diagnosis not present

## 2021-02-10 DIAGNOSIS — Z9181 History of falling: Secondary | ICD-10-CM | POA: Diagnosis not present

## 2021-02-10 DIAGNOSIS — S8254XD Nondisplaced fracture of medial malleolus of right tibia, subsequent encounter for closed fracture with routine healing: Secondary | ICD-10-CM | POA: Diagnosis not present

## 2021-02-10 DIAGNOSIS — R2681 Unsteadiness on feet: Secondary | ICD-10-CM | POA: Diagnosis not present

## 2021-02-10 DIAGNOSIS — R29898 Other symptoms and signs involving the musculoskeletal system: Secondary | ICD-10-CM | POA: Diagnosis not present

## 2021-02-11 ENCOUNTER — Other Ambulatory Visit: Payer: Self-pay | Admitting: Nurse Practitioner

## 2021-02-11 DIAGNOSIS — S8254XD Nondisplaced fracture of medial malleolus of right tibia, subsequent encounter for closed fracture with routine healing: Secondary | ICD-10-CM | POA: Diagnosis not present

## 2021-02-11 DIAGNOSIS — R29898 Other symptoms and signs involving the musculoskeletal system: Secondary | ICD-10-CM | POA: Diagnosis not present

## 2021-02-11 DIAGNOSIS — Z9181 History of falling: Secondary | ICD-10-CM | POA: Diagnosis not present

## 2021-02-11 DIAGNOSIS — M6281 Muscle weakness (generalized): Secondary | ICD-10-CM | POA: Diagnosis not present

## 2021-02-11 DIAGNOSIS — R2681 Unsteadiness on feet: Secondary | ICD-10-CM | POA: Diagnosis not present

## 2021-02-12 DIAGNOSIS — R29898 Other symptoms and signs involving the musculoskeletal system: Secondary | ICD-10-CM | POA: Diagnosis not present

## 2021-02-12 DIAGNOSIS — I10 Essential (primary) hypertension: Secondary | ICD-10-CM | POA: Diagnosis not present

## 2021-02-12 DIAGNOSIS — R2681 Unsteadiness on feet: Secondary | ICD-10-CM | POA: Diagnosis not present

## 2021-02-12 DIAGNOSIS — M6281 Muscle weakness (generalized): Secondary | ICD-10-CM | POA: Diagnosis not present

## 2021-02-12 DIAGNOSIS — Z9181 History of falling: Secondary | ICD-10-CM | POA: Diagnosis not present

## 2021-02-12 DIAGNOSIS — S8254XD Nondisplaced fracture of medial malleolus of right tibia, subsequent encounter for closed fracture with routine healing: Secondary | ICD-10-CM | POA: Diagnosis not present

## 2021-02-12 LAB — CBC AND DIFFERENTIAL
HCT: 25 — AB (ref 36–46)
Hemoglobin: 8.1 — AB (ref 12.0–16.0)
Neutrophils Absolute: 3392
Platelets: 217 (ref 150–399)
WBC: 5.7

## 2021-02-12 LAB — CBC: RBC: 2.57 — AB (ref 3.87–5.11)

## 2021-02-12 NOTE — Telephone Encounter (Signed)
Patient has request refill on prescription "Lasix 20mg ". Medication hasnt been prescribed by PCP Mast, Man X, NP . Medication pend and sent to PCP Mast, Man X, NP for approval.

## 2021-02-13 DIAGNOSIS — S8254XD Nondisplaced fracture of medial malleolus of right tibia, subsequent encounter for closed fracture with routine healing: Secondary | ICD-10-CM | POA: Diagnosis not present

## 2021-02-13 DIAGNOSIS — Z9181 History of falling: Secondary | ICD-10-CM | POA: Diagnosis not present

## 2021-02-13 DIAGNOSIS — M6281 Muscle weakness (generalized): Secondary | ICD-10-CM | POA: Diagnosis not present

## 2021-02-13 DIAGNOSIS — R2681 Unsteadiness on feet: Secondary | ICD-10-CM | POA: Diagnosis not present

## 2021-02-13 DIAGNOSIS — R29898 Other symptoms and signs involving the musculoskeletal system: Secondary | ICD-10-CM | POA: Diagnosis not present

## 2021-02-16 DIAGNOSIS — S8254XD Nondisplaced fracture of medial malleolus of right tibia, subsequent encounter for closed fracture with routine healing: Secondary | ICD-10-CM | POA: Diagnosis not present

## 2021-02-16 DIAGNOSIS — M6281 Muscle weakness (generalized): Secondary | ICD-10-CM | POA: Diagnosis not present

## 2021-02-16 DIAGNOSIS — Z9181 History of falling: Secondary | ICD-10-CM | POA: Diagnosis not present

## 2021-02-16 DIAGNOSIS — R2681 Unsteadiness on feet: Secondary | ICD-10-CM | POA: Diagnosis not present

## 2021-02-16 DIAGNOSIS — R29898 Other symptoms and signs involving the musculoskeletal system: Secondary | ICD-10-CM | POA: Diagnosis not present

## 2021-02-17 ENCOUNTER — Encounter: Payer: Self-pay | Admitting: Internal Medicine

## 2021-02-17 ENCOUNTER — Non-Acute Institutional Stay (SKILLED_NURSING_FACILITY): Payer: Medicare Other | Admitting: Internal Medicine

## 2021-02-17 DIAGNOSIS — Z9181 History of falling: Secondary | ICD-10-CM | POA: Diagnosis not present

## 2021-02-17 DIAGNOSIS — S81812D Laceration without foreign body, left lower leg, subsequent encounter: Secondary | ICD-10-CM | POA: Diagnosis not present

## 2021-02-17 DIAGNOSIS — R2681 Unsteadiness on feet: Secondary | ICD-10-CM | POA: Diagnosis not present

## 2021-02-17 DIAGNOSIS — M6281 Muscle weakness (generalized): Secondary | ICD-10-CM | POA: Diagnosis not present

## 2021-02-17 DIAGNOSIS — R29898 Other symptoms and signs involving the musculoskeletal system: Secondary | ICD-10-CM | POA: Diagnosis not present

## 2021-02-17 DIAGNOSIS — S8254XD Nondisplaced fracture of medial malleolus of right tibia, subsequent encounter for closed fracture with routine healing: Secondary | ICD-10-CM | POA: Diagnosis not present

## 2021-02-17 NOTE — Progress Notes (Signed)
Location:  [facility] Room Number: 58 Place of Service:  SNF (31) Provider:  Veleta Miners MD   Mast, Man X, NP  Patient Care Team: Mast, Man X, NP as PCP - General (Internal Medicine)  Extended Emergency Contact Information Primary Emergency Contact: [place] Phone: [phone] Work Phone: [phone] Relation: None Secondary Emergency Contact: Miyoshi,[name] Address: [place], [place] [address] [name] of [place] Phone: [phone] Work Phone: [phone] Mobile Phone: [phone] Relation: Daughter  Code Status:  DNR Goals of care: Advanced Directive information Advanced Directives 02/17/2021  Does Patient Have a Medical Advance Directive? Yes  Type of Paramedic of [place];Living will;Out of facility DNR (pink MOST or yellow form)  Does patient want to make changes to medical advance directive? No - Patient declined  Copy of [place] in Chart? Yes - validated most recent copy scanned in chart (See row information)  Pre-existing out of facility DNR order (yellow form or pink MOST form) Yellow form placed in chart (order not valid for inpatient use);Pink MOST form placed in chart (order not valid for inpatient use)     Chief Complaint  Patient presents with   Acute Visit    Skin tear Concern for infection    HPI:  Pt is a 87 y.o. female seen today for an acute visit for Skin Tear Concern for Infection  Patient has a history of urinary incontinence lower extremity edema ,hypertension, CKD, Anemia history of osteoporosis   Patient had a fall on 12/24/20 .  The x-ray showed Right  ankle fracture  displaced trimalleolar ankle fracture with likely syndesmotic disruption and Skin tear of left Leg. She failed initial conservative management with splint and immobilization and NWB. Elected for surgical management Surgery on 8/02  Today Nurses were  Concerned that some growth is coming from her Skin tera and it is infected No Fever or pain or Redness or discharge  Past Medical History:  Diagnosis Date   Anemia    Chronic kidney disease    stage 3   GERD (gastroesophageal reflux disease)    Hypertension    Past Surgical History:  Procedure Laterality Date   ORIF ANKLE FRACTURE Right 01/13/2021   Procedure: OPEN TREATMENT OF RIGHT TRIMALLEOLAR ANKLE FRACTURE WITHOUT POSTERIOR FIXATION, POSSIBLE SYNDESMOSIS;  Surgeon: Erle Crocker, MD;  Location: [facility];  Service: Orthopedics;  Laterality: Right;    Allergies  Allergen Reactions   Ace Inhibitors    Flagyl [Metronidazole]    Penicillins Swelling    facial    Allergies as of 02/17/2021       Reactions   Ace Inhibitors    Flagyl [metronidazole]    Penicillins Swelling   facial        Medication List        Accurate as of February 17, 2021  3:08 PM. If you have any questions, ask your nurse or doctor.          STOP taking these medications    alendronate 70 MG tablet Commonly known as: Fosamax Stopped by: Virgie Dad, MD       TAKE these medications    acetaminophen 325 MG tablet Commonly known as: TYLENOL Take 650 mg by mouth every 6 (six) hours as needed.   acetaminophen 325 MG tablet Commonly known as: TYLENOL Take 650 mg by mouth 2 (two) times daily.   Calcium Carb-Cholecalciferol 500-400  MG-UNIT Tabs Take 1 tablet by mouth daily.   cholecalciferol 25 MCG (1000 UNIT) tablet Commonly known as: VITAMIN D Take 2,000 Units by mouth daily.   ferrous sulfate 325 (65 FE) MG tablet Take 325 mg by mouth daily with breakfast. MON, WED, FRI   furosemide 20 MG tablet Commonly known as: LASIX TAKE 1 TABLET BY MOUTH  DAILY   ICAPS AREDS 2 PO Take by mouth 2 (two) times daily.   mirabegron ER 25 MG Tb24 tablet Commonly known as: MYRBETRIQ Take 25 mg by mouth daily.   nystatin powder Commonly known as: MYCOSTATIN/NYSTOP Apply 1  application topically 2 (two) times daily.   pantoprazole 40 MG tablet Commonly known as: PROTONIX Take 40 mg by mouth daily.   polycarbophil 625 MG tablet Commonly known as: FIBERCON Take 625 mg by mouth as needed.   potassium chloride SA 20 MEQ tablet Commonly known as: KLOR-CON Take 20 mEq by mouth daily.   valsartan 160 MG tablet Commonly known as: DIOVAN TAKE 1 TABLET BY MOUTH  DAILY   vitamin C 250 MG tablet Commonly known as: ASCORBIC ACID Take 250 mg by mouth daily.        Review of Systems Review of Systems  Constitutional: Negative for activity change, appetite change, chills, diaphoresis, fatigue and fever.  HENT: Negative for mouth sores, postnasal drip, rhinorrhea, sinus pain and sore throat.   Respiratory: Negative for apnea, cough, chest tightness, shortness of breath and wheezing.   Cardiovascular: Negative for chest pain, palpitations and leg swelling.  Gastrointestinal: Negative for abdominal distention, abdominal pain, constipation, diarrhea, nausea and vomiting.  Genitourinary: Negative for dysuria and frequency.  Musculoskeletal: Negative for arthralgias, joint swelling and myalgias.  Skin: Negative for rash.  Neurological: Negative for dizziness, syncope, weakness, light-headedness and numbness.  Psychiatric/Behavioral: Negative for behavioral problems, confusion and sleep disturbance.   Immunization History  Administered Date(s) Administered   Fluad Quad(high Dose 65+) 03/28/2019   H1N1 03/18/2009   Influenza, High Dose Seasonal PF 03/25/2017, 03/16/2018   Influenza-Unspecified 02/25/2011, 03/31/2012, 03/27/2013, 03/14/2014, 03/25/2016, 06/14/2017   Moderna SARS-COV2 Booster Vaccination 10/28/2020   Moderna Sars-Covid-2 Vaccination 06/18/2019, 07/16/2019   Pneumococcal Conjugate-13 09/10/2014   Pneumococcal Polysaccharide-23 11/03/2014   Pneumococcal-Unspecified 06/14/2017   Tdap 11/27/2019   Pertinent  Health Maintenance Due  Topic Date Due    INFLUENZA VACCINE  01/12/2021   DEXA SCAN  Completed   PNA vac Low Risk Adult  Completed   Fall Risk  12/04/2020 05/30/2020 11/23/2019 08/16/2019 05/03/2019  Falls in the past year? 0 0 0 0 0  Number falls in past yr: 0 0 0 0 0  Injury with Fall? 0 - - - -  Risk for fall due to : No Fall Risks - - - -  Follow up Falls evaluation completed - - - -   Functional Status Survey:    Vitals:   02/17/21 1456  BP: (!) 150/86  Pulse: 76  Resp: 18  Temp: 98.1 F (36.7 C)  SpO2: 93%  Weight: 116 lb 8 oz (52.8 kg)  Height: 5' 4.5" (1.638 m)   Body mass index is 19.69 kg/m. Physical Exam Constitutional: Oriented to person, place, and time. Well-developed and well-nourished.  HENT:  Head: Normocephalic.  Mouth/Throat: Oropharynx is clear and moist.  Eyes: Pupils are equal, round, and reactive to light.  Neck: Neck supple.  Cardiovascular: Normal rate and normal heart sounds.  No murmur heard. Pulmonary/Chest: Effort normal and breath sounds normal. No respiratory distress. No wheezes.  She has no rales.  Abdominal: Soft. Bowel sounds are normal. No distension. There is no tenderness. There is no rebound.  Musculoskeletal: Right Leg in the Splint Left Leg has skin tear with Clearing and Open area in the middle with   Chronic Venous changes No Signs of Infection  Lymphadenopathy: none Neurological: Alert and oriented to person, place, and time.  Skin: Skin is warm and dry.  Psychiatric: Normal mood and affect. Behavior is normal. Thought content normal.   Labs reviewed: Recent Labs    11/20/20 0730 12/08/20 0828 12/20/20 0000 12/23/20 0000 01/13/21 0741  NA 138 136 139 136* 135  K 4.2 4.6 4.5 5.2 3.9  CL 102 102 104 101 100  CO2 26 22 25* 20 26  GLUCOSE 78 79  --   --  85  BUN 44* 42* 29* 41* 23  CREATININE 1.89* 1.92* 1.9* 1.7* 1.57*  CALCIUM 10.4 10.3 9.1 9.8 9.4   Recent Labs    11/20/20 0730 12/08/20 0828 12/20/20 0000 12/23/20 0000 01/13/21 0741  AST 20 21 20  26 22   ALT 15 12 15 13 17   ALKPHOS  --   --  52 57 63  BILITOT 0.5 0.4  --   --  0.7  PROT 6.7 6.7  --   --  6.0*  ALBUMIN  --   --  4.2 4.2 3.3*   Recent Labs    11/20/20 0730 12/08/20 0828 12/20/20 0000 01/13/21 0741 01/22/21 0000 02/05/21 0000 02/12/21 0000  WBC 7.3 6.5   < > 5.9 6.5 5.7 5.7  NEUTROABS 3,614 3,244   < >  --  3,634.00 3,386.00 3,392.00  HGB 9.0* 8.6*   < > 8.0* 8.0* 7.7* 8.1*  HCT 27.1* 26.6*   < > 25.5* 24* 24* 25*  MCV 96.1 98.5  --  100.8*  --   --   --   PLT 228 180   < > 241 251 212 217   < > = values in this interval not displayed.   Lab Results  Component Value Date   TSH 2.48 11/20/2020   No results found for: HGBA1C Lab Results  Component Value Date   CHOL 159 08/16/2017   HDL 73 08/16/2017   LDLCALC 73 08/16/2017   TRIG 53 08/16/2017   CHOLHDL 2.2 08/16/2017    Significant Diagnostic Results in last 30 days:  No results found.  Assessment/Plan Noninfected skin tear of leg, left, subsequent encounter No Signs of infection Will Continue to monitor for now Xeroform Dressing  S/P ORIF (open reduction internal fixation) fracture for Trimalleolar Ankle Fracture Pain Control With Norco, Tylenol NWB    Anemia, unspecified type On Iron  Urinary incontinence  On Myrbetriq Primary hypertension Good control with Diovan Stage 3b chronic kidney disease ([place]) So far stable Venous insufficiency (chronic) (peripheral) On Low dose of Lasix Age-related osteoporosis  Taken off Fosamax Due to CKD Will reconsider if Renal Function improve   Family/ staff Communication:   Labs/tests ordered:

## 2021-02-18 ENCOUNTER — Encounter: Payer: Self-pay | Admitting: Nurse Practitioner

## 2021-02-18 NOTE — Progress Notes (Signed)
Location:   [facility] Room Number: [place] of Service:  SNF (31) Provider:  Man X Mast, NP  Mast, Man X, NP  Patient Care Team: Mast, Man X, NP as PCP - General (Internal Medicine)  Extended Emergency Contact Information Primary Emergency Contact: [place] Phone: [phone] Work Phone: [phone] Relation: None Secondary Emergency Contact: Romanello,[name] Address: [place], [place] [address] [name] of [place] Phone: [phone] Work Phone: [phone] Mobile Phone: [phone] Relation: Daughter  Code Status:  DNR Goals of care: Advanced Directive information Advanced Directives 02/17/2021  Does Patient Have a Medical Advance Directive? Yes  Type of Paramedic of [place];Living will;Out of facility DNR (pink MOST or yellow form)  Does patient want to make changes to medical advance directive? No - Patient declined  Copy of [place] in Chart? Yes - validated most recent copy scanned in chart (See row information)  Pre-existing out of facility DNR order (yellow form or pink MOST form) Yellow form placed in chart (order not valid for inpatient use);Pink MOST form placed in chart (order not valid for inpatient use)     Chief Complaint  Patient presents with   Medical Management of Chronic Issues    Routine follow up    Health Maintenance    Discuss need for shingles vaccine and influenza vaccine    HPI:  Pt is a 87 y.o. female seen today for medical management of chronic diseases.     Past Medical History:  Diagnosis Date   Anemia    Chronic kidney disease    stage 3   GERD (gastroesophageal reflux disease)    Hypertension    Past Surgical History:  Procedure Laterality Date   ORIF ANKLE FRACTURE Right 01/13/2021   Procedure: OPEN TREATMENT OF RIGHT TRIMALLEOLAR ANKLE FRACTURE WITHOUT POSTERIOR FIXATION, POSSIBLE SYNDESMOSIS;  Surgeon:  Erle Crocker, MD;  Location: [facility];  Service: Orthopedics;  Laterality: Right;    Allergies  Allergen Reactions   Ace Inhibitors    Flagyl [Metronidazole]    Penicillins Swelling    facial    Allergies as of 02/18/2021       Reactions   Ace Inhibitors    Flagyl [metronidazole]    Penicillins Swelling   facial        Medication List        Accurate as of February 18, 2021 11:28 AM. If you have any questions, ask your nurse or doctor.          acetaminophen 325 MG tablet Commonly known as: TYLENOL Take 650 mg by mouth every 6 (six) hours as needed.   acetaminophen 325 MG tablet Commonly known as: TYLENOL Take 650 mg by mouth 2 (two) times daily.   Calcium Carb-Cholecalciferol 500-400 MG-UNIT Tabs Take 1 tablet by mouth daily.   cholecalciferol 25 MCG (1000 UNIT) tablet Commonly known as: VITAMIN D Take 2,000 Units by mouth daily.   ferrous sulfate 325 (65 FE) MG tablet Take 325 mg by mouth daily with breakfast. MON, WED, FRI   furosemide 20 MG tablet Commonly known as: LASIX TAKE 1 TABLET BY MOUTH  DAILY   ICAPS AREDS 2 PO Take by mouth 2 (two) times daily.   mirabegron ER 25 MG Tb24 tablet Commonly known as: MYRBETRIQ Take 25 mg by mouth daily.   nystatin powder Commonly known as: MYCOSTATIN/NYSTOP Apply 1 application topically 2 (two) times daily.  pantoprazole 40 MG tablet Commonly known as: PROTONIX Take 40 mg by mouth daily.   polycarbophil 625 MG tablet Commonly known as: FIBERCON Take 625 mg by mouth as needed.   potassium chloride SA 20 MEQ tablet Commonly known as: KLOR-CON Take 20 mEq by mouth daily.   valsartan 160 MG tablet Commonly known as: DIOVAN TAKE 1 TABLET BY MOUTH  DAILY   vitamin C 250 MG tablet Commonly known as: ASCORBIC ACID Take 250 mg by mouth daily.        Review of Systems  Immunization History  Administered Date(s) Administered   Fluad Quad(high Dose 65+) 03/28/2019   H1N1 03/18/2009    Influenza, High Dose Seasonal PF 03/25/2017, 03/16/2018   Influenza-Unspecified 02/25/2011, 03/31/2012, 03/27/2013, 03/14/2014, 03/25/2016, 06/14/2017   Moderna SARS-COV2 Booster Vaccination 10/28/2020   Moderna Sars-Covid-2 Vaccination 06/18/2019, 07/16/2019   Pneumococcal Conjugate-13 09/10/2014   Pneumococcal Polysaccharide-23 11/03/2014   Pneumococcal-Unspecified 06/14/2017   Tdap 11/27/2019   Pertinent  Health Maintenance Due  Topic Date Due   INFLUENZA VACCINE  01/12/2021   DEXA SCAN  Completed   PNA vac Low Risk Adult  Completed   Fall Risk  12/04/2020 05/30/2020 11/23/2019 08/16/2019 05/03/2019  Falls in the past year? 0 0 0 0 0  Number falls in past yr: 0 0 0 0 0  Injury with Fall? 0 - - - -  Risk for fall due to : No Fall Risks - - - -  Follow up Falls evaluation completed - - - -   Functional Status Survey:    Vitals:   02/18/21 1123  BP: (!) 156/89  Pulse: 87  Resp: 18  Temp: 97.7 F (36.5 C)  SpO2: 95%  Weight: 116 lb 8 oz (52.8 kg)  Height: 5' 4.5" (1.638 m)   Body mass index is 19.69 kg/m. Physical Exam  Labs reviewed: Recent Labs    11/20/20 0730 12/08/20 0828 12/20/20 0000 12/23/20 0000 01/13/21 0741  NA 138 136 139 136* 135  K 4.2 4.6 4.5 5.2 3.9  CL 102 102 104 101 100  CO2 26 22 25* 20 26  GLUCOSE 78 79  --   --  85  BUN 44* 42* 29* 41* 23  CREATININE 1.89* 1.92* 1.9* 1.7* 1.57*  CALCIUM 10.4 10.3 9.1 9.8 9.4   Recent Labs    11/20/20 0730 12/08/20 0828 12/20/20 0000 12/23/20 0000 01/13/21 0741  AST 20 21 20 26 22   ALT 15 12 15 13 17   ALKPHOS  --   --  52 57 63  BILITOT 0.5 0.4  --   --  0.7  PROT 6.7 6.7  --   --  6.0*  ALBUMIN  --   --  4.2 4.2 3.3*   Recent Labs    11/20/20 0730 12/08/20 0828 12/20/20 0000 01/13/21 0741 01/22/21 0000 02/05/21 0000 02/12/21 0000  WBC 7.3 6.5   < > 5.9 6.5 5.7 5.7  NEUTROABS 3,614 3,244   < >  --  3,634.00 3,386.00 3,392.00  HGB 9.0* 8.6*   < > 8.0* 8.0* 7.7* 8.1*  HCT 27.1* 26.6*   <  > 25.5* 24* 24* 25*  MCV 96.1 98.5  --  100.8*  --   --   --   PLT 228 180   < > 241 251 212 217   < > = values in this interval not displayed.   Lab Results  Component Value Date   TSH 2.48 11/20/2020   No results found for:  HGBA1C Lab Results  Component Value Date   CHOL 159 08/16/2017   HDL 73 08/16/2017   LDLCALC 73 08/16/2017   TRIG 53 08/16/2017   CHOLHDL 2.2 08/16/2017    Significant Diagnostic Results in last 30 days:  No results found.  Assessment/Plan There are no diagnoses linked to this encounter.   Family/ staff Communication:   Labs/tests ordered:

## 2021-02-19 DIAGNOSIS — R29898 Other symptoms and signs involving the musculoskeletal system: Secondary | ICD-10-CM | POA: Diagnosis not present

## 2021-02-19 DIAGNOSIS — M6281 Muscle weakness (generalized): Secondary | ICD-10-CM | POA: Diagnosis not present

## 2021-02-19 DIAGNOSIS — Z9181 History of falling: Secondary | ICD-10-CM | POA: Diagnosis not present

## 2021-02-19 DIAGNOSIS — S8254XD Nondisplaced fracture of medial malleolus of right tibia, subsequent encounter for closed fracture with routine healing: Secondary | ICD-10-CM | POA: Diagnosis not present

## 2021-02-19 DIAGNOSIS — R2681 Unsteadiness on feet: Secondary | ICD-10-CM | POA: Diagnosis not present

## 2021-02-19 NOTE — Progress Notes (Signed)
This encounter was created in error - please disregard.

## 2021-02-20 DIAGNOSIS — Z9181 History of falling: Secondary | ICD-10-CM | POA: Diagnosis not present

## 2021-02-20 DIAGNOSIS — M6281 Muscle weakness (generalized): Secondary | ICD-10-CM | POA: Diagnosis not present

## 2021-02-20 DIAGNOSIS — R29898 Other symptoms and signs involving the musculoskeletal system: Secondary | ICD-10-CM | POA: Diagnosis not present

## 2021-02-20 DIAGNOSIS — S8254XD Nondisplaced fracture of medial malleolus of right tibia, subsequent encounter for closed fracture with routine healing: Secondary | ICD-10-CM | POA: Diagnosis not present

## 2021-02-20 DIAGNOSIS — R2681 Unsteadiness on feet: Secondary | ICD-10-CM | POA: Diagnosis not present

## 2021-02-23 DIAGNOSIS — Z9181 History of falling: Secondary | ICD-10-CM | POA: Diagnosis not present

## 2021-02-23 DIAGNOSIS — R29898 Other symptoms and signs involving the musculoskeletal system: Secondary | ICD-10-CM | POA: Diagnosis not present

## 2021-02-23 DIAGNOSIS — R2681 Unsteadiness on feet: Secondary | ICD-10-CM | POA: Diagnosis not present

## 2021-02-23 DIAGNOSIS — S8254XD Nondisplaced fracture of medial malleolus of right tibia, subsequent encounter for closed fracture with routine healing: Secondary | ICD-10-CM | POA: Diagnosis not present

## 2021-02-23 DIAGNOSIS — M6281 Muscle weakness (generalized): Secondary | ICD-10-CM | POA: Diagnosis not present

## 2021-02-24 DIAGNOSIS — M6281 Muscle weakness (generalized): Secondary | ICD-10-CM | POA: Diagnosis not present

## 2021-02-24 DIAGNOSIS — D692 Other nonthrombocytopenic purpura: Secondary | ICD-10-CM | POA: Diagnosis not present

## 2021-02-24 DIAGNOSIS — Z85068 Personal history of other malignant neoplasm of small intestine: Secondary | ICD-10-CM | POA: Diagnosis not present

## 2021-02-24 DIAGNOSIS — L814 Other melanin hyperpigmentation: Secondary | ICD-10-CM | POA: Diagnosis not present

## 2021-02-24 DIAGNOSIS — S8254XD Nondisplaced fracture of medial malleolus of right tibia, subsequent encounter for closed fracture with routine healing: Secondary | ICD-10-CM | POA: Diagnosis not present

## 2021-02-24 DIAGNOSIS — R2681 Unsteadiness on feet: Secondary | ICD-10-CM | POA: Diagnosis not present

## 2021-02-24 DIAGNOSIS — R29898 Other symptoms and signs involving the musculoskeletal system: Secondary | ICD-10-CM | POA: Diagnosis not present

## 2021-02-24 DIAGNOSIS — Z9181 History of falling: Secondary | ICD-10-CM | POA: Diagnosis not present

## 2021-02-24 DIAGNOSIS — L858 Other specified epidermal thickening: Secondary | ICD-10-CM | POA: Diagnosis not present

## 2021-02-24 DIAGNOSIS — I872 Venous insufficiency (chronic) (peripheral): Secondary | ICD-10-CM | POA: Diagnosis not present

## 2021-02-24 DIAGNOSIS — D1801 Hemangioma of skin and subcutaneous tissue: Secondary | ICD-10-CM | POA: Diagnosis not present

## 2021-02-24 DIAGNOSIS — L821 Other seborrheic keratosis: Secondary | ICD-10-CM | POA: Diagnosis not present

## 2021-02-24 DIAGNOSIS — D485 Neoplasm of uncertain behavior of skin: Secondary | ICD-10-CM | POA: Diagnosis not present

## 2021-02-25 DIAGNOSIS — Z9181 History of falling: Secondary | ICD-10-CM | POA: Diagnosis not present

## 2021-02-25 DIAGNOSIS — R29898 Other symptoms and signs involving the musculoskeletal system: Secondary | ICD-10-CM | POA: Diagnosis not present

## 2021-02-25 DIAGNOSIS — S82851A Displaced trimalleolar fracture of right lower leg, initial encounter for closed fracture: Secondary | ICD-10-CM | POA: Diagnosis not present

## 2021-02-25 DIAGNOSIS — M6281 Muscle weakness (generalized): Secondary | ICD-10-CM | POA: Diagnosis not present

## 2021-02-25 DIAGNOSIS — S93401A Sprain of unspecified ligament of right ankle, initial encounter: Secondary | ICD-10-CM | POA: Diagnosis not present

## 2021-02-25 DIAGNOSIS — S8254XD Nondisplaced fracture of medial malleolus of right tibia, subsequent encounter for closed fracture with routine healing: Secondary | ICD-10-CM | POA: Diagnosis not present

## 2021-02-25 DIAGNOSIS — R2681 Unsteadiness on feet: Secondary | ICD-10-CM | POA: Diagnosis not present

## 2021-02-26 ENCOUNTER — Non-Acute Institutional Stay (SKILLED_NURSING_FACILITY): Payer: Medicare Other | Admitting: Orthopedic Surgery

## 2021-02-26 ENCOUNTER — Encounter: Payer: Self-pay | Admitting: Orthopedic Surgery

## 2021-02-26 DIAGNOSIS — I872 Venous insufficiency (chronic) (peripheral): Secondary | ICD-10-CM

## 2021-02-26 DIAGNOSIS — S8254XD Nondisplaced fracture of medial malleolus of right tibia, subsequent encounter for closed fracture with routine healing: Secondary | ICD-10-CM

## 2021-02-26 DIAGNOSIS — D649 Anemia, unspecified: Secondary | ICD-10-CM | POA: Diagnosis not present

## 2021-02-26 DIAGNOSIS — M6281 Muscle weakness (generalized): Secondary | ICD-10-CM | POA: Diagnosis not present

## 2021-02-26 DIAGNOSIS — M810 Age-related osteoporosis without current pathological fracture: Secondary | ICD-10-CM

## 2021-02-26 DIAGNOSIS — S81812D Laceration without foreign body, left lower leg, subsequent encounter: Secondary | ICD-10-CM

## 2021-02-26 DIAGNOSIS — N3942 Incontinence without sensory awareness: Secondary | ICD-10-CM | POA: Diagnosis not present

## 2021-02-26 DIAGNOSIS — I10 Essential (primary) hypertension: Secondary | ICD-10-CM | POA: Diagnosis not present

## 2021-02-26 DIAGNOSIS — N1831 Chronic kidney disease, stage 3a: Secondary | ICD-10-CM

## 2021-02-26 DIAGNOSIS — R2681 Unsteadiness on feet: Secondary | ICD-10-CM | POA: Diagnosis not present

## 2021-02-26 DIAGNOSIS — R29898 Other symptoms and signs involving the musculoskeletal system: Secondary | ICD-10-CM | POA: Diagnosis not present

## 2021-02-26 DIAGNOSIS — Z9181 History of falling: Secondary | ICD-10-CM | POA: Diagnosis not present

## 2021-02-26 NOTE — Progress Notes (Signed)
Location:  [facility] Room Number: [place] of Service:  SNF (31) Provider:  Windell Moulding, AGNP-C  Mast, Man X, NP  Patient Care Team: Mast, Man X, NP as PCP - General (Internal Medicine)  Extended Emergency Contact Information Primary Emergency Contact: [place] Phone: [phone] Work Phone: [phone] Relation: None Secondary Emergency Contact: Rickles,[name] Address: [place], [place] [address] [name] of [place] Phone: [phone] Work Phone: [phone] Mobile Phone: [phone] Relation: Daughter  Code Status:  DNR  Goals of care: Advanced Directive information Advanced Directives 02/18/2021  Does Patient Have a Medical Advance Directive? Yes  Type of Paramedic of [place];Living will;Out of facility DNR (pink MOST or yellow form)  Does patient want to make changes to medical advance directive? No - Patient declined  Copy of [place] in Chart? Yes - validated most recent copy scanned in chart (See row information)  Pre-existing out of facility DNR order (yellow form or pink MOST form) Yellow form placed in chart (order not valid for inpatient use);Pink MOST form placed in chart (order not valid for inpatient use)     Chief Complaint  Patient presents with   Medical Management of Chronic Issues    HPI:  Pt is a 87 y.o. female seen today for medical management of chronic diseases.    She currently resides on the skilled nursing unit at [facility]. Past medical history includes: HTN, venous insufficiency, constipation, osteoporosis, CKD stage III, anemia, gait abnormality.   Right trimalleolar ankle fracture- due to fall 12/24/2020, 08/02 ORIF by Dr. Lucia Gaskins, remains NWB with boot, pain controlled with tylenol LLE skin tear- slow healing, xeroform dressing  HTN- BUN/creat 23/1.57 01/13/2021, diovan, NAS diet [place] insufficiency-  remains on low dose lasix Constipation- LBM 09/15, fibercon daily Osteoporosis- DEXA 10/2020, T score -2.8, remains on calcium and vit d supplement, fosamax discontinued due to CKD CKD stage III- BUN/creat 23/1.57 01/13/2021 Urinary incontinence- denies increased frequency, Myrbetriq daily Anemia- hgb 8.1 02/12/2021, remains on iron supplement  No recent falls, injuries or behavioral outbursts.   Recent blood pressures:  09/13- 150/90, 160/90  09/06- 156/89  09/01- 142/94, 150/86  Recent weights:  09/01- 116.5 lbs  08/03- 114.8 lbs  07/16- 112.3 lbs  Nurse does not report any concerns, vitals stable.    Past Medical History:  Diagnosis Date   Anemia    Chronic kidney disease    stage 3   GERD (gastroesophageal reflux disease)    Hypertension    Past Surgical History:  Procedure Laterality Date   ORIF ANKLE FRACTURE Right 01/13/2021   Procedure: OPEN TREATMENT OF RIGHT TRIMALLEOLAR ANKLE FRACTURE WITHOUT POSTERIOR FIXATION, POSSIBLE SYNDESMOSIS;  Surgeon: Erle Crocker, MD;  Location: [facility];  Service: Orthopedics;  Laterality: Right;    Allergies  Allergen Reactions   Ace Inhibitors    Flagyl [Metronidazole]    Penicillins Swelling    facial    Outpatient Encounter Medications as of 02/26/2021  Medication Sig   acetaminophen (TYLENOL) 325 MG tablet Take 650 mg by mouth every 6 (six) hours as needed.   acetaminophen (TYLENOL) 325 MG tablet Take 650 mg by mouth 2 (two) times daily.   Calcium Carb-Cholecalciferol 500-400 MG-UNIT TABS Take 1 tablet by mouth daily.   cholecalciferol (VITAMIN D) 1000 units tablet Take 2,000 Units by mouth daily.   ferrous sulfate 325 (65 FE) MG tablet Take 325 mg  by mouth daily with breakfast. MON, WED, FRI   furosemide (LASIX) 20 MG tablet TAKE 1 TABLET BY MOUTH  DAILY   mirabegron ER (MYRBETRIQ) 25 MG TB24 tablet Take 25 mg by mouth daily.   Multiple Vitamins-Minerals (ICAPS AREDS 2 PO) Take by mouth 2 (two) times daily.    nystatin (MYCOSTATIN/NYSTOP) powder Apply 1 application topically 2 (two) times daily.   pantoprazole (PROTONIX) 40 MG tablet Take 40 mg by mouth daily.   polycarbophil (FIBERCON) 625 MG tablet Take 625 mg by mouth as needed.    potassium chloride SA (KLOR-CON) 20 MEQ tablet Take 20 mEq by mouth daily.   valsartan (DIOVAN) 160 MG tablet TAKE 1 TABLET BY MOUTH  DAILY   vitamin C (ASCORBIC ACID) 250 MG tablet Take 250 mg by mouth daily.   No facility-administered encounter medications on file as of 02/26/2021.    Review of Systems  Constitutional:  Negative for activity change, appetite change, fatigue and fever.  HENT:  Negative for congestion, hearing loss and trouble swallowing.   Eyes:  Negative for visual disturbance.       Glasses  Respiratory:  Negative for cough, shortness of breath and wheezing.   Cardiovascular:  Positive for leg swelling. Negative for chest pain.  Gastrointestinal:  Negative for abdominal distention, abdominal pain, constipation, diarrhea and nausea.  Genitourinary:  Positive for frequency. Negative for dysuria and hematuria.  Musculoskeletal:  Positive for arthralgias and gait problem.       Right ankle pain  Skin:        LLE skin tear  Neurological:  Positive for weakness. Negative for dizziness and headaches.  Hematological:  Bruises/bleeds easily.  Psychiatric/Behavioral:  Negative for confusion and dysphoric mood. The patient is not nervous/anxious.    Immunization History  Administered Date(s) Administered   Fluad Quad(high Dose 65+) 03/28/2019   H1N1 03/18/2009   Influenza, High Dose Seasonal PF 03/25/2017, 03/16/2018   Influenza-Unspecified 02/25/2011, 03/31/2012, 03/27/2013, 03/14/2014, 03/25/2016, 06/14/2017   Moderna SARS-COV2 Booster Vaccination 10/28/2020   Moderna Sars-Covid-2 Vaccination 06/18/2019, 07/16/2019   Pneumococcal Conjugate-13 09/10/2014   Pneumococcal Polysaccharide-23 11/03/2014   Pneumococcal-Unspecified 06/14/2017   Tdap  11/27/2019   Pertinent  Health Maintenance Due  Topic Date Due   INFLUENZA VACCINE  01/12/2021   DEXA SCAN  Completed   PNA vac Low Risk Adult  Completed   Fall Risk  12/04/2020 05/30/2020 11/23/2019 08/16/2019 05/03/2019  Falls in the past year? 0 0 0 0 0  Number falls in past yr: 0 0 0 0 0  Injury with Fall? 0 - - - -  Risk for fall due to : No Fall Risks - - - -  Follow up Falls evaluation completed - - - -   Functional Status Survey:    Vitals:   02/26/21 1423  BP: (!) 150/90  Pulse: 80  Resp: 18  Temp: 98.1 F (36.7 C)  SpO2: 97%  Weight: 116 lb 8 oz (52.8 kg)  Height: 5' 4.5" (1.638 m)   Body mass index is 19.69 kg/m. Physical Exam Vitals reviewed.  Constitutional:      General: She is not in acute distress. HENT:     Head: Normocephalic.     Right Ear: There is no impacted cerumen.     Left Ear: There is no impacted cerumen.     Nose: Nose normal.     Mouth/Throat:     Mouth: Mucous membranes are moist.  Eyes:     General:  Right eye: No discharge.        Left eye: No discharge.  Neck:     Vascular: No carotid bruit.  Cardiovascular:     Rate and Rhythm: Normal rate and regular rhythm.     Pulses: Normal pulses.     Heart sounds: Normal heart sounds. No murmur heard. Pulmonary:     Effort: Pulmonary effort is normal. No respiratory distress.     Breath sounds: Normal breath sounds. No wheezing.  Abdominal:     General: Bowel sounds are normal. There is no distension.     Palpations: Abdomen is soft.     Tenderness: There is no abdominal tenderness.  Musculoskeletal:     Cervical back: Normal range of motion.     Left lower leg: Edema present.     Comments: Surgical incision closed, CDI, no drainage/erythema/swelling, surrounding skin intact. LLE with non-pitting edema.   Lymphadenopathy:     Cervical: No cervical adenopathy.  Skin:    Findings: Lesion present.     Comments: LLE skin tear CDI, covered with xeroform dressing. BLE with  brown/purple skin discoloration, skin thin and flaking.   Neurological:     General: No focal deficit present.     Mental Status: She is alert and oriented to person, place, and time.     Motor: Weakness present.     Gait: Gait abnormal.     Comments: wheelchair  Psychiatric:        Mood and Affect: Mood normal.        Behavior: Behavior normal.    Labs reviewed: Recent Labs    11/20/20 0730 12/08/20 0828 12/20/20 0000 12/23/20 0000 01/13/21 0741  NA 138 136 139 136* 135  K 4.2 4.6 4.5 5.2 3.9  CL 102 102 104 101 100  CO2 26 22 25* 20 26  GLUCOSE 78 79  --   --  85  BUN 44* 42* 29* 41* 23  CREATININE 1.89* 1.92* 1.9* 1.7* 1.57*  CALCIUM 10.4 10.3 9.1 9.8 9.4   Recent Labs    11/20/20 0730 12/08/20 0828 12/20/20 0000 12/23/20 0000 01/13/21 0741  AST 20 21 20 26 22   ALT 15 12 15 13 17   ALKPHOS  --   --  52 57 63  BILITOT 0.5 0.4  --   --  0.7  PROT 6.7 6.7  --   --  6.0*  ALBUMIN  --   --  4.2 4.2 3.3*   Recent Labs    11/20/20 0730 12/08/20 0828 12/20/20 0000 01/13/21 0741 01/22/21 0000 02/05/21 0000 02/12/21 0000  WBC 7.3 6.5   < > 5.9 6.5 5.7 5.7  NEUTROABS 3,614 3,244   < >  --  3,634.00 3,386.00 3,392.00  HGB 9.0* 8.6*   < > 8.0* 8.0* 7.7* 8.1*  HCT 27.1* 26.6*   < > 25.5* 24* 24* 25*  MCV 96.1 98.5  --  100.8*  --   --   --   PLT 228 180   < > 241 251 212 217   < > = values in this interval not displayed.   Lab Results  Component Value Date   TSH 2.48 11/20/2020   No results found for: HGBA1C Lab Results  Component Value Date   CHOL 159 08/16/2017   HDL 73 08/16/2017   LDLCALC 73 08/16/2017   TRIG 53 08/16/2017   CHOLHDL 2.2 08/16/2017    Significant Diagnostic Results in last 30 days:  No results found.  Assessment/Plan 1. Closed nondisp fracture of right medial malleolus with routine healing - followed by ortho - remains NWB with boot - pain controlled with tylenol  2. Noninfected skin tear of leg, left, subsequent encounter -  slow healing, no sign of infection - cont xeroform dressings  - advised eating protein in meals to help with wound healing  3. Primary hypertension - controlled - cont Diovan  4. Venous insufficiency (chronic) (peripheral) - non-pitting edema, BLE with brown appearance - cont lasix daily  5. Age-related osteoporosis without current pathological fracture - DEXA 10/2020, t score -2.8 - fosamax d/c due to declining kidney function - cont calcium and vit d   6. Stage 3a chronic kidney disease (HCC) - creat 1.57 01/13/2021 - cont to avoid nephrotoxic drugs like NSAIDS and dose adjust medications to be renally excreted - bmp  7. Urinary incontinence without sensory awareness - stable with Myrbetriq  8. Anemia, unspecified type - hgb 8.1 02/12/2021 - cont ferrous sulfate - cbc/diff in 1 month   Family/ staff Communication: plan discussed with patient and nurse  Labs/tests ordered:  bmp, cbc/diff in 1 month

## 2021-02-27 DIAGNOSIS — Z9181 History of falling: Secondary | ICD-10-CM | POA: Diagnosis not present

## 2021-02-27 DIAGNOSIS — R2681 Unsteadiness on feet: Secondary | ICD-10-CM | POA: Diagnosis not present

## 2021-02-27 DIAGNOSIS — S8254XD Nondisplaced fracture of medial malleolus of right tibia, subsequent encounter for closed fracture with routine healing: Secondary | ICD-10-CM | POA: Diagnosis not present

## 2021-02-27 DIAGNOSIS — R29898 Other symptoms and signs involving the musculoskeletal system: Secondary | ICD-10-CM | POA: Diagnosis not present

## 2021-02-27 DIAGNOSIS — M6281 Muscle weakness (generalized): Secondary | ICD-10-CM | POA: Diagnosis not present

## 2021-03-02 DIAGNOSIS — M6281 Muscle weakness (generalized): Secondary | ICD-10-CM | POA: Diagnosis not present

## 2021-03-02 DIAGNOSIS — R2681 Unsteadiness on feet: Secondary | ICD-10-CM | POA: Diagnosis not present

## 2021-03-02 DIAGNOSIS — R29898 Other symptoms and signs involving the musculoskeletal system: Secondary | ICD-10-CM | POA: Diagnosis not present

## 2021-03-02 DIAGNOSIS — Z9181 History of falling: Secondary | ICD-10-CM | POA: Diagnosis not present

## 2021-03-02 DIAGNOSIS — S8254XD Nondisplaced fracture of medial malleolus of right tibia, subsequent encounter for closed fracture with routine healing: Secondary | ICD-10-CM | POA: Diagnosis not present

## 2021-03-03 DIAGNOSIS — I10 Essential (primary) hypertension: Secondary | ICD-10-CM | POA: Diagnosis not present

## 2021-03-03 DIAGNOSIS — S8254XD Nondisplaced fracture of medial malleolus of right tibia, subsequent encounter for closed fracture with routine healing: Secondary | ICD-10-CM | POA: Diagnosis not present

## 2021-03-03 DIAGNOSIS — Z9181 History of falling: Secondary | ICD-10-CM | POA: Diagnosis not present

## 2021-03-03 DIAGNOSIS — E860 Dehydration: Secondary | ICD-10-CM | POA: Diagnosis not present

## 2021-03-03 DIAGNOSIS — R29898 Other symptoms and signs involving the musculoskeletal system: Secondary | ICD-10-CM | POA: Diagnosis not present

## 2021-03-03 DIAGNOSIS — M6281 Muscle weakness (generalized): Secondary | ICD-10-CM | POA: Diagnosis not present

## 2021-03-03 DIAGNOSIS — Z23 Encounter for immunization: Secondary | ICD-10-CM | POA: Diagnosis not present

## 2021-03-03 DIAGNOSIS — R2681 Unsteadiness on feet: Secondary | ICD-10-CM | POA: Diagnosis not present

## 2021-03-03 LAB — BASIC METABOLIC PANEL
BUN: 31 — AB (ref 4–21)
CO2: 28 — AB (ref 13–22)
Chloride: 104 (ref 99–108)
Creatinine: 1.6 — AB (ref 0.5–1.1)
Glucose: 83
Potassium: 4.4 (ref 3.4–5.3)
Sodium: 138 (ref 137–147)

## 2021-03-03 LAB — COMPREHENSIVE METABOLIC PANEL: Calcium: 9.5 (ref 8.7–10.7)

## 2021-03-04 DIAGNOSIS — S8254XD Nondisplaced fracture of medial malleolus of right tibia, subsequent encounter for closed fracture with routine healing: Secondary | ICD-10-CM | POA: Diagnosis not present

## 2021-03-04 DIAGNOSIS — R29898 Other symptoms and signs involving the musculoskeletal system: Secondary | ICD-10-CM | POA: Diagnosis not present

## 2021-03-04 DIAGNOSIS — R2681 Unsteadiness on feet: Secondary | ICD-10-CM | POA: Diagnosis not present

## 2021-03-04 DIAGNOSIS — Z9181 History of falling: Secondary | ICD-10-CM | POA: Diagnosis not present

## 2021-03-04 DIAGNOSIS — M6281 Muscle weakness (generalized): Secondary | ICD-10-CM | POA: Diagnosis not present

## 2021-03-05 DIAGNOSIS — S62307A Unspecified fracture of fifth metacarpal bone, left hand, initial encounter for closed fracture: Secondary | ICD-10-CM | POA: Diagnosis not present

## 2021-03-06 DIAGNOSIS — Z9181 History of falling: Secondary | ICD-10-CM | POA: Diagnosis not present

## 2021-03-06 DIAGNOSIS — R2681 Unsteadiness on feet: Secondary | ICD-10-CM | POA: Diagnosis not present

## 2021-03-06 DIAGNOSIS — R29898 Other symptoms and signs involving the musculoskeletal system: Secondary | ICD-10-CM | POA: Diagnosis not present

## 2021-03-06 DIAGNOSIS — S8254XD Nondisplaced fracture of medial malleolus of right tibia, subsequent encounter for closed fracture with routine healing: Secondary | ICD-10-CM | POA: Diagnosis not present

## 2021-03-06 DIAGNOSIS — M6281 Muscle weakness (generalized): Secondary | ICD-10-CM | POA: Diagnosis not present

## 2021-03-09 DIAGNOSIS — R2681 Unsteadiness on feet: Secondary | ICD-10-CM | POA: Diagnosis not present

## 2021-03-09 DIAGNOSIS — R29898 Other symptoms and signs involving the musculoskeletal system: Secondary | ICD-10-CM | POA: Diagnosis not present

## 2021-03-09 DIAGNOSIS — M6281 Muscle weakness (generalized): Secondary | ICD-10-CM | POA: Diagnosis not present

## 2021-03-09 DIAGNOSIS — S8254XD Nondisplaced fracture of medial malleolus of right tibia, subsequent encounter for closed fracture with routine healing: Secondary | ICD-10-CM | POA: Diagnosis not present

## 2021-03-09 DIAGNOSIS — Z9181 History of falling: Secondary | ICD-10-CM | POA: Diagnosis not present

## 2021-03-10 DIAGNOSIS — M6281 Muscle weakness (generalized): Secondary | ICD-10-CM | POA: Diagnosis not present

## 2021-03-10 DIAGNOSIS — S8254XD Nondisplaced fracture of medial malleolus of right tibia, subsequent encounter for closed fracture with routine healing: Secondary | ICD-10-CM | POA: Diagnosis not present

## 2021-03-10 DIAGNOSIS — R29898 Other symptoms and signs involving the musculoskeletal system: Secondary | ICD-10-CM | POA: Diagnosis not present

## 2021-03-10 DIAGNOSIS — Z9181 History of falling: Secondary | ICD-10-CM | POA: Diagnosis not present

## 2021-03-10 DIAGNOSIS — R2681 Unsteadiness on feet: Secondary | ICD-10-CM | POA: Diagnosis not present

## 2021-03-12 DIAGNOSIS — R29898 Other symptoms and signs involving the musculoskeletal system: Secondary | ICD-10-CM | POA: Diagnosis not present

## 2021-03-12 DIAGNOSIS — M6281 Muscle weakness (generalized): Secondary | ICD-10-CM | POA: Diagnosis not present

## 2021-03-12 DIAGNOSIS — Z9181 History of falling: Secondary | ICD-10-CM | POA: Diagnosis not present

## 2021-03-12 DIAGNOSIS — S8254XD Nondisplaced fracture of medial malleolus of right tibia, subsequent encounter for closed fracture with routine healing: Secondary | ICD-10-CM | POA: Diagnosis not present

## 2021-03-12 DIAGNOSIS — R2681 Unsteadiness on feet: Secondary | ICD-10-CM | POA: Diagnosis not present

## 2021-03-13 DIAGNOSIS — Z9181 History of falling: Secondary | ICD-10-CM | POA: Diagnosis not present

## 2021-03-13 DIAGNOSIS — M6281 Muscle weakness (generalized): Secondary | ICD-10-CM | POA: Diagnosis not present

## 2021-03-13 DIAGNOSIS — R2681 Unsteadiness on feet: Secondary | ICD-10-CM | POA: Diagnosis not present

## 2021-03-13 DIAGNOSIS — R29898 Other symptoms and signs involving the musculoskeletal system: Secondary | ICD-10-CM | POA: Diagnosis not present

## 2021-03-13 DIAGNOSIS — S8254XD Nondisplaced fracture of medial malleolus of right tibia, subsequent encounter for closed fracture with routine healing: Secondary | ICD-10-CM | POA: Diagnosis not present

## 2021-03-16 DIAGNOSIS — S8254XD Nondisplaced fracture of medial malleolus of right tibia, subsequent encounter for closed fracture with routine healing: Secondary | ICD-10-CM | POA: Diagnosis not present

## 2021-03-16 DIAGNOSIS — R29898 Other symptoms and signs involving the musculoskeletal system: Secondary | ICD-10-CM | POA: Diagnosis not present

## 2021-03-16 DIAGNOSIS — R2681 Unsteadiness on feet: Secondary | ICD-10-CM | POA: Diagnosis not present

## 2021-03-16 DIAGNOSIS — Z9181 History of falling: Secondary | ICD-10-CM | POA: Diagnosis not present

## 2021-03-16 DIAGNOSIS — M6281 Muscle weakness (generalized): Secondary | ICD-10-CM | POA: Diagnosis not present

## 2021-03-17 ENCOUNTER — Non-Acute Institutional Stay (SKILLED_NURSING_FACILITY): Payer: Medicare Other | Admitting: Internal Medicine

## 2021-03-17 ENCOUNTER — Encounter: Payer: Self-pay | Admitting: Internal Medicine

## 2021-03-17 DIAGNOSIS — N3942 Incontinence without sensory awareness: Secondary | ICD-10-CM | POA: Diagnosis not present

## 2021-03-17 DIAGNOSIS — Z9889 Other specified postprocedural states: Secondary | ICD-10-CM

## 2021-03-17 DIAGNOSIS — I10 Essential (primary) hypertension: Secondary | ICD-10-CM

## 2021-03-17 DIAGNOSIS — S81812D Laceration without foreign body, left lower leg, subsequent encounter: Secondary | ICD-10-CM

## 2021-03-17 DIAGNOSIS — N1832 Chronic kidney disease, stage 3b: Secondary | ICD-10-CM

## 2021-03-17 DIAGNOSIS — Z9181 History of falling: Secondary | ICD-10-CM | POA: Diagnosis not present

## 2021-03-17 DIAGNOSIS — Z8781 Personal history of (healed) traumatic fracture: Secondary | ICD-10-CM | POA: Diagnosis not present

## 2021-03-17 DIAGNOSIS — M6281 Muscle weakness (generalized): Secondary | ICD-10-CM | POA: Diagnosis not present

## 2021-03-17 DIAGNOSIS — M810 Age-related osteoporosis without current pathological fracture: Secondary | ICD-10-CM

## 2021-03-17 DIAGNOSIS — R29898 Other symptoms and signs involving the musculoskeletal system: Secondary | ICD-10-CM | POA: Diagnosis not present

## 2021-03-17 DIAGNOSIS — S8254XD Nondisplaced fracture of medial malleolus of right tibia, subsequent encounter for closed fracture with routine healing: Secondary | ICD-10-CM | POA: Diagnosis not present

## 2021-03-17 DIAGNOSIS — R2681 Unsteadiness on feet: Secondary | ICD-10-CM | POA: Diagnosis not present

## 2021-03-17 NOTE — Progress Notes (Signed)
Location:   [facility] Room Number: 59 Place of Service:  SNF (31) Provider:  Veleta Miners MD  Mast, Man X, NP  Patient Care Team: Mast, Man X, NP as PCP - General (Internal Medicine)  Extended Emergency Contact Information Primary Emergency Contact: Pink Phone: [phone] Work Phone: [phone] Relation: None Secondary Emergency Contact: Bramble,[name] Address: [place], [place] [address] [name] of [place] Phone: [phone] Work Phone: [phone] Mobile Phone: [phone] Relation: Daughter  Code Status:  DNR Goals of care: Advanced Directive information Advanced Directives 03/17/2021  Does Patient Have a Medical Advance Directive? Yes  Type of Paramedic of [place];Living will;Out of facility DNR (pink MOST or yellow form)  Does patient want to make changes to medical advance directive? No - Patient declined  Copy of [place] in Chart? Yes - validated most recent copy scanned in chart (See row information)  Pre-existing out of facility DNR order (yellow form or pink MOST form) Yellow form placed in chart (order not valid for inpatient use);Pink MOST form placed in chart (order not valid for inpatient use)     Chief Complaint  Patient presents with   Medical Management of Chronic Issues   Readmit To SNF   Quality Metric Gaps    Shingrix, Flu shot, #4 covid    HPI:  Pt is a 87 y.o. female seen today for medical management of chronic diseases.   And Readmit to SNF   Patient has a history of urinary incontinence lower extremity edema ,hypertension, CKD, Anemia history of osteoporosis   Patient had a fall on 12/24/20 .  The x-ray showed Right  ankle fracture  displaced trimalleolar ankle fracture with likely syndesmotic disruption and Skin tear of left Leg. She failed initial conservative management with splint and immobilization and  NWB. Elected for surgical management Surgery on 8/02   Doing well Plan to eventually go back to her Apartment She has a friend who is ready to help her if needed Right now Therapy is working  with her to get independent for  her ADLS  Cognitively doing well. Has Daughter who lives in [place] who is helping No Pain  Still getting Xeroform dressing for Left Leg skin tear    Past Medical History:  Diagnosis Date   Anemia    Chronic kidney disease    stage 3   GERD (gastroesophageal reflux disease)    Hypertension    Past Surgical History:  Procedure Laterality Date   ORIF ANKLE FRACTURE Right 01/13/2021   Procedure: OPEN TREATMENT OF RIGHT TRIMALLEOLAR ANKLE FRACTURE WITHOUT POSTERIOR FIXATION, POSSIBLE SYNDESMOSIS;  Surgeon: Erle Crocker, MD;  Location: [facility];  Service: Orthopedics;  Laterality: Right;    Allergies  Allergen Reactions   Ace Inhibitors    Flagyl [Metronidazole]    Penicillins Swelling    facial    Allergies as of 03/17/2021       Reactions   Ace Inhibitors    Flagyl [metronidazole]    Penicillins Swelling   facial        Medication List        Accurate as of March 17, 2021  3:38 PM. If you have any questions, ask your nurse or doctor.          acetaminophen 325 MG tablet Commonly known as: TYLENOL Take 650 mg by mouth every 6 (six) hours as needed.  acetaminophen 325 MG tablet Commonly known as: TYLENOL Take 650 mg by mouth 2 (two) times daily.   Calcium Carb-Cholecalciferol 500-400 MG-UNIT Tabs Take 1 tablet by mouth daily.   cholecalciferol 25 MCG (1000 UNIT) tablet Commonly known as: VITAMIN D Take 2,000 Units by mouth daily.   ferrous sulfate 325 (65 FE) MG tablet Take 325 mg by mouth daily with breakfast. MON, WED, FRI   furosemide 20 MG tablet Commonly known as: LASIX TAKE 1 TABLET BY MOUTH  DAILY   ICAPS AREDS 2 PO Take by mouth 2 (two) times daily.   mirabegron ER 25 MG Tb24 tablet Commonly known as:  MYRBETRIQ Take 25 mg by mouth daily.   nystatin powder Commonly known as: MYCOSTATIN/NYSTOP Apply 1 application topically 2 (two) times daily.   pantoprazole 40 MG tablet Commonly known as: PROTONIX Take 40 mg by mouth daily.   polycarbophil 625 MG tablet Commonly known as: FIBERCON Take 625 mg by mouth as needed.   potassium chloride SA 20 MEQ tablet Commonly known as: KLOR-CON Take 20 mEq by mouth daily.   valsartan 160 MG tablet Commonly known as: DIOVAN TAKE 1 TABLET BY MOUTH  DAILY   vitamin C 250 MG tablet Commonly known as: ASCORBIC ACID Take 250 mg by mouth daily.        Review of Systems Review of Systems  Constitutional: Negative for activity change, appetite change, chills, diaphoresis, fatigue and fever.  HENT: Negative for mouth sores, postnasal drip, rhinorrhea, sinus pain and sore throat.   Respiratory: Negative for apnea, cough, chest tightness, shortness of breath and wheezing.   Cardiovascular: Negative for chest pain, palpitations and leg swelling.  Gastrointestinal: Negative for abdominal distention, abdominal pain, constipation, diarrhea, nausea and vomiting.  Genitourinary: Positive for worsening Incontinence with Frequency Musculoskeletal: Negative for arthralgias, joint swelling and myalgias.  Skin: Negative for rash.  Neurological: Negative for dizziness, syncope, weakness, light-headedness and numbness.  Psychiatric/Behavioral: Negative for behavioral problems, confusion and sleep disturbance.    Immunization History  Administered Date(s) Administered   Fluad Quad(high Dose 65+) 03/28/2019   H1N1 03/18/2009   Influenza, High Dose Seasonal PF 03/25/2017, 03/16/2018   Influenza-Unspecified 02/25/2011, 03/31/2012, 03/27/2013, 03/14/2014, 03/25/2016, 06/14/2017   Moderna SARS-COV2 Booster Vaccination 10/28/2020   Moderna Sars-Covid-2 Vaccination 06/18/2019, 07/16/2019   Pneumococcal Conjugate-13 09/10/2014   Pneumococcal Polysaccharide-23  11/03/2014   Pneumococcal-Unspecified 06/14/2017   Tdap 11/27/2019   Pertinent  Health Maintenance Due  Topic Date Due   INFLUENZA VACCINE  01/12/2021   DEXA SCAN  Completed   Fall Risk  12/04/2020 05/30/2020 11/23/2019 08/16/2019 05/03/2019  Falls in the past year? 0 0 0 0 0  Number falls in past yr: 0 0 0 0 0  Injury with Fall? 0 - - - -  Risk for fall due to : No Fall Risks - - - -  Follow up Falls evaluation completed - - - -   Functional Status Survey:    Vitals:   03/17/21 1532  BP: 120/68  Pulse: 70  Resp: 18  Temp: 98.1 F (36.7 C)  SpO2: 95%  Weight: 118 lb 8 oz (53.8 kg)  Height: 5' 4.5" (1.638 m)   Body mass index is 20.03 kg/m. Physical Exam Constitutional: Oriented to person, place, and time. Well-developed and well-nourished.  HENT:  Head: Normocephalic.  Mouth/Throat: Oropharynx is clear and moist.  Eyes: Pupils are equal, round, and reactive to light.  Neck: Neck supple.  Cardiovascular: Normal rate and normal heart sounds.  No murmur  heard. Pulmonary/Chest: Effort normal and breath sounds normal. No respiratory distress. No wheezes. She has no rales.  Abdominal: Soft. Bowel sounds are normal. No distension. There is no tenderness. There is no rebound.  Musculoskeletal:  Right leg has Boot Let leg with Chronic Venous changes  Lymphadenopathy: none Neurological: Alert and oriented to person, place, and time.  Skin: Skin is warm and dry.  Psychiatric: Normal mood and affect. Behavior is normal. Thought content normal.   Labs reviewed: Recent Labs    11/20/20 0730 12/08/20 0828 12/20/20 0000 12/23/20 0000 01/13/21 0741 03/03/21 0000  NA 138 136   < > 136* 135 138  K 4.2 4.6   < > 5.2 3.9 4.4  CL 102 102   < > 101 100 104  CO2 26 22   < > 20 26 28*  GLUCOSE 78 79  --   --  85  --   BUN 44* 42*   < > 41* 23 31*  CREATININE 1.89* 1.92*   < > 1.7* 1.57* 1.6*  CALCIUM 10.4 10.3   < > 9.8 9.4 9.5   < > = values in this interval not displayed.    Recent Labs    11/20/20 0730 12/08/20 0828 12/20/20 0000 12/23/20 0000 01/13/21 0741  AST 20 21 20 26 22   ALT 15 12 15 13 17   ALKPHOS  --   --  52 57 63  BILITOT 0.5 0.4  --   --  0.7  PROT 6.7 6.7  --   --  6.0*  ALBUMIN  --   --  4.2 4.2 3.3*   Recent Labs    11/20/20 0730 12/08/20 0828 12/20/20 0000 01/13/21 0741 01/22/21 0000 02/05/21 0000 02/12/21 0000  WBC 7.3 6.5   < > 5.9 6.5 5.7 5.7  NEUTROABS 3,614 3,244   < >  --  3,634.00 3,386.00 3,392.00  HGB 9.0* 8.6*   < > 8.0* 8.0* 7.7* 8.1*  HCT 27.1* 26.6*   < > 25.5* 24* 24* 25*  MCV 96.1 98.5  --  100.8*  --   --   --   PLT 228 180   < > 241 251 212 217   < > = values in this interval not displayed.   Lab Results  Component Value Date   TSH 2.48 11/20/2020   No results found for: HGBA1C Lab Results  Component Value Date   CHOL 159 08/16/2017   HDL 73 08/16/2017   LDLCALC 73 08/16/2017   TRIG 53 08/16/2017   CHOLHDL 2.2 08/16/2017    Significant Diagnostic Results in last 30 days:  No results found.  Assessment/Plan Stage 3b chronic kidney disease (HCC) Fosamax was discontinued Daughter wants Renal Referal which we will do when he gets discharged as outpatient Urinary incontinence without sensory awareness Change Myrbetriq to 50 mg due to  worsening symptoms of Continence on Lower dose Noninfected skin tear of leg, left, subsequent encounter Continue Xeroform dressing Primary hypertension Stable on Valsartan Age-related osteoporosis without current pathological fracture Fosamax discontinued due to Worsening CKD  S/P ORIF (open reduction internal fixation) fracture Trimalleolar Ankle Fracture WBAT  Walking with walker and Mild assist Anemia On Iron Repeat CBC pending   Family/ staff Communication:   Labs/tests ordered:   Total time spent in this patient care encounter was  45_  minutes; greater than 50% of the visit spent counseling patient daughter and staff, reviewing records , Labs and  coordinating care for problems addressed at this  encounter.

## 2021-03-18 DIAGNOSIS — S8254XD Nondisplaced fracture of medial malleolus of right tibia, subsequent encounter for closed fracture with routine healing: Secondary | ICD-10-CM | POA: Diagnosis not present

## 2021-03-18 DIAGNOSIS — Z9181 History of falling: Secondary | ICD-10-CM | POA: Diagnosis not present

## 2021-03-18 DIAGNOSIS — M6281 Muscle weakness (generalized): Secondary | ICD-10-CM | POA: Diagnosis not present

## 2021-03-18 DIAGNOSIS — R29898 Other symptoms and signs involving the musculoskeletal system: Secondary | ICD-10-CM | POA: Diagnosis not present

## 2021-03-18 DIAGNOSIS — R2681 Unsteadiness on feet: Secondary | ICD-10-CM | POA: Diagnosis not present

## 2021-03-19 DIAGNOSIS — R2681 Unsteadiness on feet: Secondary | ICD-10-CM | POA: Diagnosis not present

## 2021-03-19 DIAGNOSIS — R29898 Other symptoms and signs involving the musculoskeletal system: Secondary | ICD-10-CM | POA: Diagnosis not present

## 2021-03-19 DIAGNOSIS — Z9181 History of falling: Secondary | ICD-10-CM | POA: Diagnosis not present

## 2021-03-19 DIAGNOSIS — M6281 Muscle weakness (generalized): Secondary | ICD-10-CM | POA: Diagnosis not present

## 2021-03-19 DIAGNOSIS — S8254XD Nondisplaced fracture of medial malleolus of right tibia, subsequent encounter for closed fracture with routine healing: Secondary | ICD-10-CM | POA: Diagnosis not present

## 2021-03-20 DIAGNOSIS — M6281 Muscle weakness (generalized): Secondary | ICD-10-CM | POA: Diagnosis not present

## 2021-03-20 DIAGNOSIS — S8254XD Nondisplaced fracture of medial malleolus of right tibia, subsequent encounter for closed fracture with routine healing: Secondary | ICD-10-CM | POA: Diagnosis not present

## 2021-03-20 DIAGNOSIS — R2681 Unsteadiness on feet: Secondary | ICD-10-CM | POA: Diagnosis not present

## 2021-03-20 DIAGNOSIS — Z9181 History of falling: Secondary | ICD-10-CM | POA: Diagnosis not present

## 2021-03-20 DIAGNOSIS — R29898 Other symptoms and signs involving the musculoskeletal system: Secondary | ICD-10-CM | POA: Diagnosis not present

## 2021-03-23 DIAGNOSIS — R2681 Unsteadiness on feet: Secondary | ICD-10-CM | POA: Diagnosis not present

## 2021-03-23 DIAGNOSIS — M6281 Muscle weakness (generalized): Secondary | ICD-10-CM | POA: Diagnosis not present

## 2021-03-23 DIAGNOSIS — S8254XD Nondisplaced fracture of medial malleolus of right tibia, subsequent encounter for closed fracture with routine healing: Secondary | ICD-10-CM | POA: Diagnosis not present

## 2021-03-23 DIAGNOSIS — Z9181 History of falling: Secondary | ICD-10-CM | POA: Diagnosis not present

## 2021-03-23 DIAGNOSIS — R29898 Other symptoms and signs involving the musculoskeletal system: Secondary | ICD-10-CM | POA: Diagnosis not present

## 2021-03-24 DIAGNOSIS — R29898 Other symptoms and signs involving the musculoskeletal system: Secondary | ICD-10-CM | POA: Diagnosis not present

## 2021-03-24 DIAGNOSIS — S8254XD Nondisplaced fracture of medial malleolus of right tibia, subsequent encounter for closed fracture with routine healing: Secondary | ICD-10-CM | POA: Diagnosis not present

## 2021-03-24 DIAGNOSIS — Z9181 History of falling: Secondary | ICD-10-CM | POA: Diagnosis not present

## 2021-03-24 DIAGNOSIS — M6281 Muscle weakness (generalized): Secondary | ICD-10-CM | POA: Diagnosis not present

## 2021-03-24 DIAGNOSIS — R2681 Unsteadiness on feet: Secondary | ICD-10-CM | POA: Diagnosis not present

## 2021-03-25 DIAGNOSIS — M6281 Muscle weakness (generalized): Secondary | ICD-10-CM | POA: Diagnosis not present

## 2021-03-25 DIAGNOSIS — S8254XD Nondisplaced fracture of medial malleolus of right tibia, subsequent encounter for closed fracture with routine healing: Secondary | ICD-10-CM | POA: Diagnosis not present

## 2021-03-25 DIAGNOSIS — R2681 Unsteadiness on feet: Secondary | ICD-10-CM | POA: Diagnosis not present

## 2021-03-25 DIAGNOSIS — R29898 Other symptoms and signs involving the musculoskeletal system: Secondary | ICD-10-CM | POA: Diagnosis not present

## 2021-03-25 DIAGNOSIS — Z9181 History of falling: Secondary | ICD-10-CM | POA: Diagnosis not present

## 2021-03-26 DIAGNOSIS — M6281 Muscle weakness (generalized): Secondary | ICD-10-CM | POA: Diagnosis not present

## 2021-03-26 DIAGNOSIS — R29898 Other symptoms and signs involving the musculoskeletal system: Secondary | ICD-10-CM | POA: Diagnosis not present

## 2021-03-26 DIAGNOSIS — S8254XD Nondisplaced fracture of medial malleolus of right tibia, subsequent encounter for closed fracture with routine healing: Secondary | ICD-10-CM | POA: Diagnosis not present

## 2021-03-26 DIAGNOSIS — Z9181 History of falling: Secondary | ICD-10-CM | POA: Diagnosis not present

## 2021-03-26 DIAGNOSIS — R2681 Unsteadiness on feet: Secondary | ICD-10-CM | POA: Diagnosis not present

## 2021-03-26 DIAGNOSIS — D6489 Other specified anemias: Secondary | ICD-10-CM | POA: Diagnosis not present

## 2021-03-27 DIAGNOSIS — Z9181 History of falling: Secondary | ICD-10-CM | POA: Diagnosis not present

## 2021-03-27 DIAGNOSIS — M6281 Muscle weakness (generalized): Secondary | ICD-10-CM | POA: Diagnosis not present

## 2021-03-27 DIAGNOSIS — R29898 Other symptoms and signs involving the musculoskeletal system: Secondary | ICD-10-CM | POA: Diagnosis not present

## 2021-03-27 DIAGNOSIS — R2681 Unsteadiness on feet: Secondary | ICD-10-CM | POA: Diagnosis not present

## 2021-03-27 DIAGNOSIS — S8254XD Nondisplaced fracture of medial malleolus of right tibia, subsequent encounter for closed fracture with routine healing: Secondary | ICD-10-CM | POA: Diagnosis not present

## 2021-03-30 DIAGNOSIS — M6281 Muscle weakness (generalized): Secondary | ICD-10-CM | POA: Diagnosis not present

## 2021-03-30 DIAGNOSIS — R2681 Unsteadiness on feet: Secondary | ICD-10-CM | POA: Diagnosis not present

## 2021-03-30 DIAGNOSIS — Z9181 History of falling: Secondary | ICD-10-CM | POA: Diagnosis not present

## 2021-03-30 DIAGNOSIS — R29898 Other symptoms and signs involving the musculoskeletal system: Secondary | ICD-10-CM | POA: Diagnosis not present

## 2021-03-30 DIAGNOSIS — S8254XD Nondisplaced fracture of medial malleolus of right tibia, subsequent encounter for closed fracture with routine healing: Secondary | ICD-10-CM | POA: Diagnosis not present

## 2021-03-31 DIAGNOSIS — Z9181 History of falling: Secondary | ICD-10-CM | POA: Diagnosis not present

## 2021-03-31 DIAGNOSIS — S8254XD Nondisplaced fracture of medial malleolus of right tibia, subsequent encounter for closed fracture with routine healing: Secondary | ICD-10-CM | POA: Diagnosis not present

## 2021-03-31 DIAGNOSIS — R8279 Other abnormal findings on microbiological examination of urine: Secondary | ICD-10-CM | POA: Diagnosis not present

## 2021-03-31 DIAGNOSIS — N3941 Urge incontinence: Secondary | ICD-10-CM | POA: Diagnosis not present

## 2021-03-31 DIAGNOSIS — R29898 Other symptoms and signs involving the musculoskeletal system: Secondary | ICD-10-CM | POA: Diagnosis not present

## 2021-03-31 DIAGNOSIS — R2681 Unsteadiness on feet: Secondary | ICD-10-CM | POA: Diagnosis not present

## 2021-03-31 DIAGNOSIS — M6281 Muscle weakness (generalized): Secondary | ICD-10-CM | POA: Diagnosis not present

## 2021-04-01 DIAGNOSIS — S8254XD Nondisplaced fracture of medial malleolus of right tibia, subsequent encounter for closed fracture with routine healing: Secondary | ICD-10-CM | POA: Diagnosis not present

## 2021-04-01 DIAGNOSIS — R29898 Other symptoms and signs involving the musculoskeletal system: Secondary | ICD-10-CM | POA: Diagnosis not present

## 2021-04-01 DIAGNOSIS — R2681 Unsteadiness on feet: Secondary | ICD-10-CM | POA: Diagnosis not present

## 2021-04-01 DIAGNOSIS — M6281 Muscle weakness (generalized): Secondary | ICD-10-CM | POA: Diagnosis not present

## 2021-04-01 DIAGNOSIS — Z9181 History of falling: Secondary | ICD-10-CM | POA: Diagnosis not present

## 2021-04-02 DIAGNOSIS — S8254XD Nondisplaced fracture of medial malleolus of right tibia, subsequent encounter for closed fracture with routine healing: Secondary | ICD-10-CM | POA: Diagnosis not present

## 2021-04-02 DIAGNOSIS — Z9181 History of falling: Secondary | ICD-10-CM | POA: Diagnosis not present

## 2021-04-02 DIAGNOSIS — R2681 Unsteadiness on feet: Secondary | ICD-10-CM | POA: Diagnosis not present

## 2021-04-02 DIAGNOSIS — R29898 Other symptoms and signs involving the musculoskeletal system: Secondary | ICD-10-CM | POA: Diagnosis not present

## 2021-04-02 DIAGNOSIS — M6281 Muscle weakness (generalized): Secondary | ICD-10-CM | POA: Diagnosis not present

## 2021-04-03 DIAGNOSIS — S8254XD Nondisplaced fracture of medial malleolus of right tibia, subsequent encounter for closed fracture with routine healing: Secondary | ICD-10-CM | POA: Diagnosis not present

## 2021-04-03 DIAGNOSIS — Z9181 History of falling: Secondary | ICD-10-CM | POA: Diagnosis not present

## 2021-04-03 DIAGNOSIS — M6281 Muscle weakness (generalized): Secondary | ICD-10-CM | POA: Diagnosis not present

## 2021-04-03 DIAGNOSIS — R29898 Other symptoms and signs involving the musculoskeletal system: Secondary | ICD-10-CM | POA: Diagnosis not present

## 2021-04-03 DIAGNOSIS — R2681 Unsteadiness on feet: Secondary | ICD-10-CM | POA: Diagnosis not present

## 2021-04-06 ENCOUNTER — Non-Acute Institutional Stay (SKILLED_NURSING_FACILITY): Payer: Medicare Other | Admitting: Nurse Practitioner

## 2021-04-06 ENCOUNTER — Encounter: Payer: Self-pay | Admitting: Nurse Practitioner

## 2021-04-06 DIAGNOSIS — N3942 Incontinence without sensory awareness: Secondary | ICD-10-CM

## 2021-04-06 DIAGNOSIS — S8254XD Nondisplaced fracture of medial malleolus of right tibia, subsequent encounter for closed fracture with routine healing: Secondary | ICD-10-CM | POA: Diagnosis not present

## 2021-04-06 DIAGNOSIS — S81812D Laceration without foreign body, left lower leg, subsequent encounter: Secondary | ICD-10-CM | POA: Diagnosis not present

## 2021-04-06 DIAGNOSIS — M25571 Pain in right ankle and joints of right foot: Secondary | ICD-10-CM | POA: Diagnosis not present

## 2021-04-06 DIAGNOSIS — M8000XD Age-related osteoporosis with current pathological fracture, unspecified site, subsequent encounter for fracture with routine healing: Secondary | ICD-10-CM

## 2021-04-06 DIAGNOSIS — I872 Venous insufficiency (chronic) (peripheral): Secondary | ICD-10-CM

## 2021-04-06 DIAGNOSIS — Z9889 Other specified postprocedural states: Secondary | ICD-10-CM | POA: Diagnosis not present

## 2021-04-06 DIAGNOSIS — K5901 Slow transit constipation: Secondary | ICD-10-CM

## 2021-04-06 DIAGNOSIS — D649 Anemia, unspecified: Secondary | ICD-10-CM | POA: Diagnosis not present

## 2021-04-06 DIAGNOSIS — N1831 Chronic kidney disease, stage 3a: Secondary | ICD-10-CM

## 2021-04-06 DIAGNOSIS — I10 Essential (primary) hypertension: Secondary | ICD-10-CM

## 2021-04-06 NOTE — Assessment & Plan Note (Signed)
Continue daily XeroFoam dressing.

## 2021-04-06 NOTE — Assessment & Plan Note (Signed)
blood pressure is controlled, on Valsartan

## 2021-04-06 NOTE — Assessment & Plan Note (Signed)
improved on Furosemide, 12/19/20 venous US LLE negative DVT

## 2021-04-06 NOTE — Assessment & Plan Note (Signed)
Bun/creat 28/1.6 03/03/21, pending nephrology referral.

## 2021-04-06 NOTE — Assessment & Plan Note (Signed)
taking Fibercon prn.

## 2021-04-06 NOTE — Progress Notes (Signed)
Location:  [facility] Room Number: 63-A Place of Service:  SNF (31)  Provider: Marlana Latus NP  PCP: Lorenz Donley X, NP Patient Care Team: Duana Benedict X, NP as PCP - General (Internal Medicine)  Extended Emergency Contact Information Primary Emergency Contact: Yetter Phone: [phone] Work Phone: [phone] Relation: None Secondary Emergency Contact: Rease,[name] Address: [place], [place] [address] [name] of [place] Phone: [phone] Work Phone: [phone] Mobile Phone: [phone] Relation: Daughter  Code Status: DNR Goals of care:  Advanced Directive information Advanced Directives 04/06/2021  Does Patient Have a Medical Advance Directive? Yes  Type of Paramedic of [place];Living will;Out of facility DNR (pink MOST or yellow form)  Does patient want to make changes to medical advance directive? No - Patient declined  Copy of [place] in Chart? Yes - validated most recent copy scanned in chart (See row information)  Pre-existing out of facility DNR order (yellow form or pink MOST form) -     Allergies  Allergen Reactions   Ace Inhibitors    Flagyl [Metronidazole]    Penicillins Swelling    facial    Chief Complaint  Patient presents with   Discharge Note    Discharge [place]     HPI:  87 y.o. female with medical history of gait abnormality, multiple sites OA, anemia, swelling BLE, CKD, HTN, urinary frequency, and osteoporosis was admitted to [facility] for therapy and higher level of care following A ankle fracture. The patient is s/p ORIF, WBAT, f/u Ortho, she has regained her physical strength, ADL function. The patient is stable to return [place] [facility] with assistance of family, will continue therapy.     12/23/20 R ankle fracture ( Fracture of the medial malleolus. Nondisplaced fracture of the distal fibula. S/p ORIF, in brace, WBAT Anemia,  Hgb 7.2 01/20/21>>8.1 02/12/21>>8.3 03/26/21, on Pantoprazole for GI protection, FOBT negative x3, normal Fe, Vit B12, Folate. On Fe Edema BLE, improved on Furosemide, 12/19/20 venous US LLE negative DVT CKD Bun/creat 28/1.6 03/03/21, pending nephrology referral.              Her urinary frequency/leakage, uses pads, it seems better since taking Myrbetriq in pm per Urology             HTN, blood pressure is controlled, on Valsartan             No constipation, taking Fibercon prn.               Osteoporosis, off Alendronate due to CKD  Past Medical History:  Diagnosis Date   Anemia    Chronic kidney disease    stage 3   GERD (gastroesophageal reflux disease)    Hypertension     Past Surgical History:  Procedure Laterality Date   ORIF ANKLE FRACTURE Right 01/13/2021   Procedure: OPEN TREATMENT OF RIGHT TRIMALLEOLAR ANKLE FRACTURE WITHOUT POSTERIOR FIXATION, POSSIBLE SYNDESMOSIS;  Surgeon: Erle Crocker, MD;  Location: [facility];  Service: Orthopedics;  Laterality: Right;      reports that she has quit smoking. Her smoking use included cigarettes. She has never used smokeless tobacco. She reports that she does not currently use alcohol. She reports that she does not use drugs. Social History   Socioeconomic History   Marital status: Widowed    Spouse name: Tyrone Nine   Number of children: 2   Years of education: Not on  file   Highest education level: Not on file  Occupational History   Occupation: Pharmacist, hospital    Comment: retired  Tobacco Use   Smoking status: Former    Years: 12.00    Types: Cigarettes   Smokeless tobacco: Never  Vaping Use   Vaping Use: Never used  Substance and Sexual Activity   Alcohol use: Not Currently   Drug use: No   Sexual activity: Not on file  Other Topics Concern   Not on file  Social History Narrative   Social History     Socioeconomic History       Marital status: Married   09/01/1954       Spouse name: Tyrone Nine   Two people stay in the home on the fourth  floor       Number of children: 2       Years of education:       Highest education level: Not on file    Exercise: yoga, walking     Social Needs       Financial resource strain: Not on file       Food insecurity - worry: Not on file       Food insecurity - inability: Not on file       Transportation needs - medical: Not on file       Transportation needs - non-medical: Not on file     Occupational History       Not on file     Tobacco Use       Smoking status: Never Smoker       Smokeless tobacco: Never Used     Substance and Sexual Activity       Alcohol use: No       Drug use: No       Sexual activity: Not on file     Other Topics       Concerns:         Not on file     Social History Narrative       Not on file   Social Determinants of Health   Financial Resource Strain: Not on file  Food Insecurity: Not on file  Transportation Needs: Not on file  Physical Activity: Not on file  Stress: Not on file  Social Connections: Not on file  Intimate Partner Violence: Not on file   Functional Status Survey:    Allergies  Allergen Reactions   Ace Inhibitors    Flagyl [Metronidazole]    Penicillins Swelling    facial    Pertinent  Health Maintenance Due  Topic Date Due   INFLUENZA VACCINE  01/12/2021   DEXA SCAN  Completed    Medications: Allergies as of 04/06/2021       Reactions   Ace Inhibitors    Flagyl [metronidazole]    Penicillins Swelling   facial        Medication List        Accurate as of April 06, 2021 11:59 PM. If you have any questions, ask your nurse or doctor.          acetaminophen 325 MG tablet Commonly known as: TYLENOL Take 650 mg by mouth every 6 (six) hours as needed. What changed: Another medication with the same name was removed. Continue taking this medication, and follow the directions you see here. Changed by: Kyrell Ruacho X Karina Lenderman, NP   Calcium Carb-Cholecalciferol 500-400 MG-UNIT Tabs Take 1 tablet by mouth daily.    cholecalciferol  25 MCG (1000 UNIT) tablet Commonly known as: VITAMIN D Take 2,000 Units by mouth daily.   ferrous sulfate 325 (65 FE) MG tablet Take 325 mg by mouth daily with breakfast. MON, WED, FRI   furosemide 20 MG tablet Commonly known as: LASIX TAKE 1 TABLET BY MOUTH  DAILY   ICAPS AREDS 2 PO Take by mouth 2 (two) times daily.   mirabegron ER 25 MG Tb24 tablet Commonly known as: MYRBETRIQ Take 50 mg by mouth daily.   nystatin powder Commonly known as: MYCOSTATIN/NYSTOP Apply 1 application topically 2 (two) times daily.   pantoprazole 40 MG tablet Commonly known as: PROTONIX Take 40 mg by mouth daily.   polycarbophil 625 MG tablet Commonly known as: FIBERCON Take 625 mg by mouth as needed.   potassium chloride SA 20 MEQ tablet Commonly known as: KLOR-CON Take 20 mEq by mouth daily.   valsartan 160 MG tablet Commonly known as: DIOVAN TAKE 1 TABLET BY MOUTH  DAILY   vitamin C 250 MG tablet Commonly known as: ASCORBIC ACID Take 250 mg by mouth daily.        Review of Systems  Constitutional:  Negative for activity change, appetite change and fever.  HENT:  Positive for hearing loss. Negative for congestion and voice change.   Eyes:  Negative for visual disturbance.  Respiratory:  Negative for cough and shortness of breath.   Cardiovascular:  Positive for leg swelling.  Gastrointestinal:  Negative for abdominal pain and constipation.  Genitourinary:  Positive for frequency. Negative for dysuria and urgency.       Occasionally incontinent of urine. Urination average every 4 hrs during day 1x/night.   Musculoskeletal:  Positive for arthralgias and gait problem.       Right lower leg/ankle brace  Skin:  Positive for wound.       Left calf skin tear  Neurological:  Negative for speech difficulty, weakness and light-headedness.       Memory lapses.   Psychiatric/Behavioral:  Negative for behavioral problems and sleep disturbance. The patient is not  nervous/anxious.    Vitals:   04/06/21 1433  BP: 132/70  Pulse: 72  Resp: 18  Temp: 97.9 F (36.6 C)  SpO2: 96%  Weight: 118 lb 8 oz (53.8 kg)  Height: 5' 4.5" (1.638 m)   Body mass index is 20.03 kg/m. Physical Exam Vitals reviewed.  Constitutional:      Appearance: Normal appearance.  HENT:     Head: Normocephalic and atraumatic.     Nose: Nose normal.     Mouth/Throat:     Mouth: Mucous membranes are moist.  Eyes:     Extraocular Movements: Extraocular movements intact.     Conjunctiva/sclera: Conjunctivae normal.     Pupils: Pupils are equal, round, and reactive to light.  Cardiovascular:     Rate and Rhythm: Normal rate and regular rhythm.     Heart sounds: No murmur heard. Pulmonary:     Breath sounds: No rales.  Abdominal:     General: Bowel sounds are normal.     Palpations: Abdomen is soft.     Tenderness: There is no abdominal tenderness.  Musculoskeletal:        General: No tenderness.     Cervical back: Normal range of motion and neck supple.     Right lower leg: Edema present.     Left lower leg: Edema present.     Comments: Right ankle in brace, s/p ORIF 01/13/21. Edema BLE 1+.  Skin:    General: Skin is warm and dry.     Coloration: Skin is jaundiced.     Findings: No bruising.     Comments: Chronic pigmented venous insufficiency skin changes BLE. Left calf skin tear is slow healing.   Neurological:     Mental Status: She is alert. Mental status is at baseline.     Gait: Gait abnormal.     Comments: Oriented to person, place.   Psychiatric:        Mood and Affect: Mood normal.        Behavior: Behavior normal.        Thought Content: Thought content normal.    Labs reviewed: Basic Metabolic Panel: Recent Labs    11/20/20 0730 12/08/20 0828 12/20/20 0000 12/23/20 0000 01/13/21 0741 03/03/21 0000  NA 138 136   < > 136* 135 138  K 4.2 4.6   < > 5.2 3.9 4.4  CL 102 102   < > 101 100 104  CO2 26 22   < > 20 26 28*  GLUCOSE 78 79  --    --  85  --   BUN 44* 42*   < > 41* 23 31*  CREATININE 1.89* 1.92*   < > 1.7* 1.57* 1.6*  CALCIUM 10.4 10.3   < > 9.8 9.4 9.5   < > = values in this interval not displayed.   Liver Function Tests: Recent Labs    11/20/20 0730 12/08/20 0828 12/20/20 0000 12/23/20 0000 01/13/21 0741  AST 20 21 20 26 22   ALT 15 12 15 13 17   ALKPHOS  --   --  52 57 63  BILITOT 0.5 0.4  --   --  0.7  PROT 6.7 6.7  --   --  6.0*  ALBUMIN  --   --  4.2 4.2 3.3*   No results for input(s): LIPASE, AMYLASE in the last 8760 hours. No results for input(s): AMMONIA in the last 8760 hours. CBC: Recent Labs    11/20/20 0730 12/08/20 0828 12/20/20 0000 01/13/21 0741 01/22/21 0000 02/05/21 0000 02/12/21 0000  WBC 7.3 6.5   < > 5.9 6.5 5.7 5.7  NEUTROABS 3,614 3,244   < >  --  3,634.00 3,386.00 3,392.00  HGB 9.0* 8.6*   < > 8.0* 8.0* 7.7* 8.1*  HCT 27.1* 26.6*   < > 25.5* 24* 24* 25*  MCV 96.1 98.5  --  100.8*  --   --   --   PLT 228 180   < > 241 251 212 217   < > = values in this interval not displayed.   Cardiac Enzymes: No results for input(s): CKTOTAL, CKMB, CKMBINDEX, TROPONINI in the last 8760 hours. BNP: Invalid input(s): POCBNP CBG: No results for input(s): GLUCAP in the last 8760 hours.  Procedures and Imaging Studies During Stay: No results found.  Assessment/Plan:   Noninfected skin tear of leg, left, subsequent encounter Continue daily XeroFoam dressing.   Osteoporosis off Alendronate due to CKD  Slow transit constipation taking Fibercon prn.   Hypertension blood pressure is controlled, on Valsartan  Incontinent of urine Her urinary frequency/leakage, uses pads, it seems better since taking Myrbetriq in pm per Urology  CKD (chronic kidney disease) stage 3, GFR 30-59 ml/min (HCC) Bun/creat 28/1.6 03/03/21, pending nephrology referral.   Venous insufficiency (chronic) (peripheral) improved on Furosemide, 12/19/20 venous US LLE negative DVT  Anemia Hgb 7.2 01/20/21>>8.1  02/12/21>>8.3 03/26/21, on Pantoprazole for GI protection, FOBT  negative x3, normal Fe, Vit B12, Folate. On Fe  Closed nondisp fracture of right medial malleolus with routine healing 12/23/20 R ankle fracture ( Fracture of the medial malleolus. Nondisplaced fracture of the distal fibula. S/p ORIF, in brace, WBAT   Patient is being discharged with the following home health services:    Patient is being discharged with the following durable medical equipment:    Patient has been advised to f/u with their PCP in 1-2 weeks to bring them up to date on their rehab stay.  Social services at facility was responsible for arranging this appointment.  Pt was provided with a 30 day supply of prescriptions for medications and refills must be obtained from their PCP.  For controlled substances, a more limited supply may be provided adequate until PCP appointment only.  Future labs/tests needed:  prn

## 2021-04-06 NOTE — Assessment & Plan Note (Signed)
Her urinary frequency/leakage, uses pads, it seems better since taking Myrbetriq in pm per Urology

## 2021-04-06 NOTE — Assessment & Plan Note (Signed)
off Alendronate due to CKD

## 2021-04-06 NOTE — Assessment & Plan Note (Signed)
Hgb 7.2 01/20/21>>8.1 02/12/21>>8.3 03/26/21, on Pantoprazole for GI protection, FOBT negative x3, normal Fe, Vit B12, Folate. On Fe

## 2021-04-06 NOTE — Assessment & Plan Note (Signed)
12/23/20 R ankle fracture ( Fracture of the medial malleolus. Nondisplaced fracture of the distal fibula. S/p ORIF, in brace, WBAT

## 2021-04-07 ENCOUNTER — Encounter: Payer: Self-pay | Admitting: Nurse Practitioner

## 2021-04-07 DIAGNOSIS — M25671 Stiffness of right ankle, not elsewhere classified: Secondary | ICD-10-CM | POA: Diagnosis not present

## 2021-04-07 DIAGNOSIS — Z9181 History of falling: Secondary | ICD-10-CM | POA: Diagnosis not present

## 2021-04-07 DIAGNOSIS — R41841 Cognitive communication deficit: Secondary | ICD-10-CM | POA: Diagnosis not present

## 2021-04-07 DIAGNOSIS — R29898 Other symptoms and signs involving the musculoskeletal system: Secondary | ICD-10-CM | POA: Diagnosis not present

## 2021-04-07 DIAGNOSIS — S8254XD Nondisplaced fracture of medial malleolus of right tibia, subsequent encounter for closed fracture with routine healing: Secondary | ICD-10-CM | POA: Diagnosis not present

## 2021-04-07 DIAGNOSIS — S82891D Other fracture of right lower leg, subsequent encounter for closed fracture with routine healing: Secondary | ICD-10-CM | POA: Diagnosis not present

## 2021-04-07 DIAGNOSIS — M6281 Muscle weakness (generalized): Secondary | ICD-10-CM | POA: Diagnosis not present

## 2021-04-07 DIAGNOSIS — R2681 Unsteadiness on feet: Secondary | ICD-10-CM | POA: Diagnosis not present

## 2021-04-08 DIAGNOSIS — R41841 Cognitive communication deficit: Secondary | ICD-10-CM | POA: Diagnosis not present

## 2021-04-08 DIAGNOSIS — S8254XD Nondisplaced fracture of medial malleolus of right tibia, subsequent encounter for closed fracture with routine healing: Secondary | ICD-10-CM | POA: Diagnosis not present

## 2021-04-08 DIAGNOSIS — R2681 Unsteadiness on feet: Secondary | ICD-10-CM | POA: Diagnosis not present

## 2021-04-08 DIAGNOSIS — Z9181 History of falling: Secondary | ICD-10-CM | POA: Diagnosis not present

## 2021-04-08 DIAGNOSIS — R29898 Other symptoms and signs involving the musculoskeletal system: Secondary | ICD-10-CM | POA: Diagnosis not present

## 2021-04-08 DIAGNOSIS — S82891D Other fracture of right lower leg, subsequent encounter for closed fracture with routine healing: Secondary | ICD-10-CM | POA: Diagnosis not present

## 2021-04-10 DIAGNOSIS — S82891D Other fracture of right lower leg, subsequent encounter for closed fracture with routine healing: Secondary | ICD-10-CM | POA: Diagnosis not present

## 2021-04-10 DIAGNOSIS — R2681 Unsteadiness on feet: Secondary | ICD-10-CM | POA: Diagnosis not present

## 2021-04-10 DIAGNOSIS — S8254XD Nondisplaced fracture of medial malleolus of right tibia, subsequent encounter for closed fracture with routine healing: Secondary | ICD-10-CM | POA: Diagnosis not present

## 2021-04-10 DIAGNOSIS — Z9181 History of falling: Secondary | ICD-10-CM | POA: Diagnosis not present

## 2021-04-10 DIAGNOSIS — R29898 Other symptoms and signs involving the musculoskeletal system: Secondary | ICD-10-CM | POA: Diagnosis not present

## 2021-04-10 DIAGNOSIS — R41841 Cognitive communication deficit: Secondary | ICD-10-CM | POA: Diagnosis not present

## 2021-04-13 ENCOUNTER — Other Ambulatory Visit: Payer: Self-pay

## 2021-04-13 DIAGNOSIS — S82891D Other fracture of right lower leg, subsequent encounter for closed fracture with routine healing: Secondary | ICD-10-CM | POA: Diagnosis not present

## 2021-04-13 DIAGNOSIS — Z9181 History of falling: Secondary | ICD-10-CM | POA: Diagnosis not present

## 2021-04-13 DIAGNOSIS — S8254XD Nondisplaced fracture of medial malleolus of right tibia, subsequent encounter for closed fracture with routine healing: Secondary | ICD-10-CM | POA: Diagnosis not present

## 2021-04-13 DIAGNOSIS — R41841 Cognitive communication deficit: Secondary | ICD-10-CM | POA: Diagnosis not present

## 2021-04-13 DIAGNOSIS — R29898 Other symptoms and signs involving the musculoskeletal system: Secondary | ICD-10-CM | POA: Diagnosis not present

## 2021-04-13 DIAGNOSIS — R2681 Unsteadiness on feet: Secondary | ICD-10-CM | POA: Diagnosis not present

## 2021-04-13 DIAGNOSIS — I10 Essential (primary) hypertension: Secondary | ICD-10-CM

## 2021-04-13 MED ORDER — PANTOPRAZOLE SODIUM 40 MG PO TBEC: 40.0000 mg | DELAYED_RELEASE_TABLET | Freq: Every day | ORAL | 1 refills | Status: DC

## 2021-04-13 MED ORDER — VITAMIN D3 25 MCG (1000 UNIT) PO TABS: 2000.0000 [IU] | ORAL_TABLET | Freq: Every day | ORAL | 1 refills | Status: DC

## 2021-04-13 MED ORDER — POTASSIUM CHLORIDE CRYS ER 20 MEQ PO TBCR: 20.0000 meq | EXTENDED_RELEASE_TABLET | Freq: Every day | ORAL | 1 refills | Status: DC

## 2021-04-13 MED ORDER — ACETAMINOPHEN 325 MG PO TABS: 650.0000 mg | ORAL_TABLET | Freq: Four times a day (QID) | ORAL | 0 refills | Status: DC | PRN

## 2021-04-13 MED ORDER — VITAMIN C 250 MG PO TABS: 250.0000 mg | ORAL_TABLET | Freq: Every day | ORAL | 1 refills | Status: AC

## 2021-04-13 MED ORDER — CALCIUM POLYCARBOPHIL 625 MG PO TABS: 625.0000 mg | ORAL_TABLET | ORAL | 1 refills | Status: DC | PRN

## 2021-04-13 MED ORDER — FUROSEMIDE 20 MG PO TABS: 20.0000 mg | ORAL_TABLET | Freq: Every day | ORAL | 1 refills | Status: DC

## 2021-04-13 MED ORDER — NYSTATIN 100000 UNIT/GM EX POWD: 1.0000 "application " | Freq: Two times a day (BID) | CUTANEOUS | 1 refills | Status: DC

## 2021-04-13 MED ORDER — VALSARTAN 160 MG PO TABS
160.0000 mg | ORAL_TABLET | Freq: Every day | ORAL | 1 refills | Status: DC
Start: 2021-04-13 — End: 2021-10-28

## 2021-04-13 MED ORDER — MIRABEGRON ER 25 MG PO TB24: 50.0000 mg | ORAL_TABLET | Freq: Every day | ORAL | 1 refills | Status: DC

## 2021-04-13 MED ORDER — FERROUS SULFATE 325 (65 FE) MG PO TABS: 325.0000 mg | ORAL_TABLET | Freq: Every day | ORAL | 1 refills | Status: DC

## 2021-04-15 DIAGNOSIS — R41841 Cognitive communication deficit: Secondary | ICD-10-CM | POA: Diagnosis not present

## 2021-04-15 DIAGNOSIS — R29898 Other symptoms and signs involving the musculoskeletal system: Secondary | ICD-10-CM | POA: Diagnosis not present

## 2021-04-15 DIAGNOSIS — S8254XD Nondisplaced fracture of medial malleolus of right tibia, subsequent encounter for closed fracture with routine healing: Secondary | ICD-10-CM | POA: Diagnosis not present

## 2021-04-15 DIAGNOSIS — M6281 Muscle weakness (generalized): Secondary | ICD-10-CM | POA: Diagnosis not present

## 2021-04-15 DIAGNOSIS — R2681 Unsteadiness on feet: Secondary | ICD-10-CM | POA: Diagnosis not present

## 2021-04-15 DIAGNOSIS — M25671 Stiffness of right ankle, not elsewhere classified: Secondary | ICD-10-CM | POA: Diagnosis not present

## 2021-04-15 DIAGNOSIS — Z9181 History of falling: Secondary | ICD-10-CM | POA: Diagnosis not present

## 2021-04-15 DIAGNOSIS — S82891D Other fracture of right lower leg, subsequent encounter for closed fracture with routine healing: Secondary | ICD-10-CM | POA: Diagnosis not present

## 2021-04-16 ENCOUNTER — Other Ambulatory Visit: Payer: Self-pay

## 2021-04-16 ENCOUNTER — Other Ambulatory Visit: Payer: Self-pay | Admitting: Nurse Practitioner

## 2021-04-16 ENCOUNTER — Encounter: Payer: Self-pay | Admitting: Nurse Practitioner

## 2021-04-16 ENCOUNTER — Non-Acute Institutional Stay: Payer: Medicare Other | Admitting: Nurse Practitioner

## 2021-04-16 DIAGNOSIS — M8000XD Age-related osteoporosis with current pathological fracture, unspecified site, subsequent encounter for fracture with routine healing: Secondary | ICD-10-CM | POA: Diagnosis not present

## 2021-04-16 DIAGNOSIS — I872 Venous insufficiency (chronic) (peripheral): Secondary | ICD-10-CM

## 2021-04-16 DIAGNOSIS — N3942 Incontinence without sensory awareness: Secondary | ICD-10-CM

## 2021-04-16 DIAGNOSIS — I130 Hypertensive heart and chronic kidney disease with heart failure and stage 1 through stage 4 chronic kidney disease, or unspecified chronic kidney disease: Secondary | ICD-10-CM | POA: Diagnosis not present

## 2021-04-16 DIAGNOSIS — K5901 Slow transit constipation: Secondary | ICD-10-CM | POA: Diagnosis not present

## 2021-04-16 DIAGNOSIS — D649 Anemia, unspecified: Secondary | ICD-10-CM

## 2021-04-16 DIAGNOSIS — I10 Essential (primary) hypertension: Secondary | ICD-10-CM | POA: Diagnosis not present

## 2021-04-16 DIAGNOSIS — N1831 Chronic kidney disease, stage 3a: Secondary | ICD-10-CM | POA: Diagnosis not present

## 2021-04-16 NOTE — Assessment & Plan Note (Addendum)
Seems worsened in setting of weigh gained about #4ibs in about 10 days, denied SOB, cough, chest pain/pressure or palpitation, denied PND, takes  Furosemide, 12/19/20 venous US LLE negative DVT. Update BNP

## 2021-04-16 NOTE — Assessment & Plan Note (Addendum)
off Alendronate due to CKD, 11/05/20 DEXA t score -2.8

## 2021-04-16 NOTE — Assessment & Plan Note (Signed)
Bun/creat 28/1.6 03/03/21, pending nephrology referral. repeat CMP/eGFR

## 2021-04-16 NOTE — Assessment & Plan Note (Signed)
Hgb 7.2 01/20/21>>8.1 02/12/21>>8.3 03/26/21, on Pantoprazole for GI protection, FOBT negative x3, normal Fe, Vit B12, Folate. On Fe. Update CBC/diff.

## 2021-04-16 NOTE — Assessment & Plan Note (Signed)
Her urinary frequency/leakage, uses pads, it seems better since taking Myrbetriq in pm per Urology

## 2021-04-16 NOTE — Assessment & Plan Note (Signed)
blood pressure is controlled, on Valsartan

## 2021-04-16 NOTE — Assessment & Plan Note (Signed)
Stable, taking Fibercon prn.

## 2021-04-16 NOTE — Progress Notes (Addendum)
Location:   clinic [place]   Place of Service:  Clinic (12) Provider: Marlana Latus NP  Code Status: DNR Goals of Care: [place] 04/06/2021  Does Patient Have a Medical Advance Directive? Yes  Type of Paramedic of [place];Living will;Out of facility DNR (pink MOST or yellow form)  Does patient want to make changes to medical advance directive? No - Patient declined  Copy of [place] in Chart? Yes - validated most recent copy scanned in chart (See row information)  Pre-existing out of facility DNR order (yellow form or pink MOST form) -     Chief Complaint  Patient presents with   Follow-up    Recently discharged from skilled nursing . Here with daughter Chrys Racer.      HPI: Patient is a 87 y.o. female seen today for medical management of chronic diseases.     12/23/20 R ankle fracture ( Fracture of the medial malleolus. Nondisplaced fracture of the distal fibula. S/p ORIF, in brace, WBAT, ambulates with walker.  Anemia, Hgb 7.2 01/20/21>>8.1 02/12/21>>8.3 03/26/21, on Pantoprazole for GI protection, FOBT negative x3, normal Fe, Vit B12, Folate. On Fe Edema BLE, improved on Furosemide, 12/19/20 venous US LLE negative DVT CKD Bun/creat 28/1.6 03/03/21, pending nephrology referral.              Her urinary frequency/leakage, uses pads, it seems better since taking Myrbetriq in pm per Urology             HTN, blood pressure is controlled, on Valsartan             No constipation, taking Fibercon prn.               Osteoporosis, off Alendronate due to CKD, 11/05/20 DEXA t score -2.8 Past Medical History:  Diagnosis Date   Anemia    Chronic kidney disease    stage 3   GERD (gastroesophageal reflux disease)    Hypertension     Past Surgical History:  Procedure Laterality Date   ORIF ANKLE FRACTURE Right 01/13/2021   Procedure: OPEN TREATMENT OF RIGHT TRIMALLEOLAR ANKLE FRACTURE WITHOUT POSTERIOR FIXATION, POSSIBLE SYNDESMOSIS;   Surgeon: Erle Crocker, MD;  Location: [facility];  Service: Orthopedics;  Laterality: Right;    Allergies  Allergen Reactions   Ace Inhibitors    Flagyl [Metronidazole]    Penicillins Swelling    facial    Allergies as of 04/16/2021       Reactions   Ace Inhibitors    Flagyl [metronidazole]    Penicillins Swelling   facial        Medication List        Accurate as of April 16, 2021 11:59 PM. If you have any questions, ask your nurse or doctor.          acetaminophen 325 MG tablet Commonly known as: TYLENOL Take 2 tablets (650 mg total) by mouth every 6 (six) hours as needed.   Calcium Carb-Cholecalciferol 500-400 MG-UNIT Tabs Take 1 tablet by mouth daily.   cholecalciferol 25 MCG (1000 UNIT) tablet Commonly known as: VITAMIN D Take 2 tablets (2,000 Units total) by mouth daily.   ferrous sulfate 325 (65 FE) MG tablet Take 1 tablet (325 mg total) by mouth daily with breakfast. MON, WED, FRI   furosemide 20 MG tablet Commonly known as: LASIX Take 1 tablet (20 mg total) by mouth daily.   ICAPS AREDS 2 PO Take by mouth 2 (two) times daily.  mirabegron ER 25 MG Tb24 tablet Commonly known as: MYRBETRIQ Take 2 tablets (50 mg total) by mouth daily.   nystatin powder Commonly known as: MYCOSTATIN/NYSTOP Apply 1 application topically 2 (two) times daily.   pantoprazole 40 MG tablet Commonly known as: PROTONIX Take 1 tablet (40 mg total) by mouth daily.   polycarbophil 625 MG tablet Commonly known as: FIBERCON Take 1 tablet (625 mg total) by mouth as needed.   potassium chloride SA 20 MEQ tablet Commonly known as: KLOR-CON Take 1 tablet (20 mEq total) by mouth daily.   valsartan 160 MG tablet Commonly known as: DIOVAN Take 1 tablet (160 mg total) by mouth daily.   vitamin C 250 MG tablet Commonly known as: ASCORBIC ACID Take 1 tablet (250 mg total) by mouth daily.        Review of Systems:  Review of Systems  Constitutional:  Positive  for unexpected weight change. Negative for fatigue and fever.  HENT:  Positive for hearing loss. Negative for congestion and voice change.   Eyes:  Negative for visual disturbance.  Respiratory:  Negative for cough and shortness of breath.   Cardiovascular:  Positive for leg swelling.  Gastrointestinal:  Negative for abdominal pain and constipation.  Genitourinary:  Positive for frequency. Negative for dysuria and urgency.       Occasionally incontinent of urine. Urination average every 4 hrs during day 1x/night.   Musculoskeletal:  Positive for arthralgias and gait problem.       Right lower leg/ankle brace  Skin:  Positive for wound.       Left calf skin tear is near healed.   Neurological:  Negative for speech difficulty, weakness and light-headedness.       Memory lapses.   Psychiatric/Behavioral:  Negative for behavioral problems and sleep disturbance. The patient is not nervous/anxious.    Health Maintenance  Topic Date Due   Zoster Vaccines- Shingrix (1 of 2) Never done   COVID-19 Vaccine (3 - Moderna risk series) 11/25/2020   TETANUS/TDAP  11/26/2029   Pneumonia Vaccine 26+ Years old  Completed   INFLUENZA VACCINE  Completed   DEXA SCAN  Completed   HPV VACCINES  Aged Out    Physical Exam: Vitals:   04/16/21 1505  BP: (!) 150/82  Pulse: 75  Temp: (!) 96.9 F (36.1 C)  TempSrc: Temporal  SpO2: 97%  Weight: 122 lb (55.3 kg)  Height: 5' 4.5" (1.638 m)   Body mass index is 20.62 kg/m. Physical Exam Vitals reviewed.  Constitutional:      Appearance: Normal appearance.  HENT:     Head: Normocephalic and atraumatic.     Nose: Nose normal.     Mouth/Throat:     Mouth: Mucous membranes are moist.  Eyes:     Extraocular Movements: Extraocular movements intact.     Conjunctiva/sclera: Conjunctivae normal.     Pupils: Pupils are equal, round, and reactive to light.  Cardiovascular:     Rate and Rhythm: Normal rate and regular rhythm.     Heart sounds: No murmur  heard. Pulmonary:     Breath sounds: No rales.  Abdominal:     General: Bowel sounds are normal.     Palpations: Abdomen is soft.     Tenderness: There is no abdominal tenderness.  Musculoskeletal:        General: No tenderness.     Cervical back: Normal range of motion and neck supple.     Right lower leg: Edema present.  Left lower leg: Edema present.     Comments: Right ankle in brace, s/p ORIF 01/13/21. Edema BLE 1-2+R>L  Skin:    General: Skin is warm and dry.     Findings: No bruising.     Comments: Chronic pigmented venous insufficiency skin changes BLE. Left calf skin tear is slow healing.   Neurological:     Mental Status: She is alert. Mental status is at baseline.     Gait: Gait abnormal.     Comments: Oriented to person, place.   Psychiatric:        Mood and Affect: Mood normal.        Behavior: Behavior normal.        Thought Content: Thought content normal.    Labs reviewed: Basic Metabolic Panel: Recent Labs    11/20/20 0730 12/08/20 0828 12/20/20 0000 12/23/20 0000 01/13/21 0741 03/03/21 0000  NA 138 136   < > 136* 135 138  K 4.2 4.6   < > 5.2 3.9 4.4  CL 102 102   < > 101 100 104  CO2 26 22   < > 20 26 28*  GLUCOSE 78 79  --   --  85  --   BUN 44* 42*   < > 41* 23 31*  CREATININE 1.89* 1.92*   < > 1.7* 1.57* 1.6*  CALCIUM 10.4 10.3   < > 9.8 9.4 9.5  TSH 2.48  --   --   --   --   --    < > = values in this interval not displayed.   Liver Function Tests: Recent Labs    11/20/20 0730 12/08/20 0828 12/20/20 0000 12/23/20 0000 01/13/21 0741  AST '20 21 20 26 22  ' ALT '15 12 15 13 17  ' ALKPHOS  --   --  52 57 63  BILITOT 0.5 0.4  --   --  0.7  PROT 6.7 6.7  --   --  6.0*  ALBUMIN  --   --  4.2 4.2 3.3*   No results for input(s): LIPASE, AMYLASE in the last 8760 hours. No results for input(s): AMMONIA in the last 8760 hours. CBC: Recent Labs    11/20/20 0730 12/08/20 0828 12/20/20 0000 01/13/21 0741 01/22/21 0000 02/05/21 0000  02/12/21 0000  WBC 7.3 6.5   < > 5.9 6.5 5.7 5.7  NEUTROABS 3,614 3,244   < >  --  3,634.00 3,386.00 3,392.00  HGB 9.0* 8.6*   < > 8.0* 8.0* 7.7* 8.1*  HCT 27.1* 26.6*   < > 25.5* 24* 24* 25*  MCV 96.1 98.5  --  100.8*  --   --   --   PLT 228 180   < > 241 251 212 217   < > = values in this interval not displayed.   Lipid Panel: No results for input(s): CHOL, HDL, LDLCALC, TRIG, CHOLHDL, LDLDIRECT in the last 8760 hours. No results found for: HGBA1C  Procedures since last visit: No results found.  Assessment/Plan  Anemia Hgb 7.2 01/20/21>>8.1 02/12/21>>8.3 03/26/21, on Pantoprazole for GI protection, FOBT negative x3, normal Fe, Vit B12, Folate. On Fe. Update CBC/diff.   CKD (chronic kidney disease) stage 3, GFR 30-59 ml/min (HCC) Bun/creat 28/1.6 03/03/21, pending nephrology referral. repeat CMP/eGFR  Venous insufficiency (chronic) (peripheral) Seems worsened in setting of weigh gained about #4ibs in about 10 days, denied SOB, cough, chest pain/pressure or palpitation, denied PND, takes  Furosemide, 12/19/20 venous US LLE negative DVT. Update BNP  Incontinent of urine Her urinary frequency/leakage, uses pads, it seems better since taking Myrbetriq in pm per Urology  Hypertension blood pressure is controlled, on Valsartan  Slow transit constipation Stable, taking Fibercon prn.   Osteoporosis off Alendronate due to CKD, 11/05/20 DEXA t score -2.8   Labs/tests ordered:  CBC/diff, CMP/eGFR, BNP  Next appt:  4 weeks

## 2021-04-17 ENCOUNTER — Encounter: Payer: Self-pay | Admitting: Nurse Practitioner

## 2021-04-17 DIAGNOSIS — S82891D Other fracture of right lower leg, subsequent encounter for closed fracture with routine healing: Secondary | ICD-10-CM | POA: Diagnosis not present

## 2021-04-17 DIAGNOSIS — S8254XD Nondisplaced fracture of medial malleolus of right tibia, subsequent encounter for closed fracture with routine healing: Secondary | ICD-10-CM | POA: Diagnosis not present

## 2021-04-17 DIAGNOSIS — R2681 Unsteadiness on feet: Secondary | ICD-10-CM | POA: Diagnosis not present

## 2021-04-17 DIAGNOSIS — R29898 Other symptoms and signs involving the musculoskeletal system: Secondary | ICD-10-CM | POA: Diagnosis not present

## 2021-04-17 DIAGNOSIS — R41841 Cognitive communication deficit: Secondary | ICD-10-CM | POA: Diagnosis not present

## 2021-04-17 DIAGNOSIS — Z9181 History of falling: Secondary | ICD-10-CM | POA: Diagnosis not present

## 2021-04-20 DIAGNOSIS — R2681 Unsteadiness on feet: Secondary | ICD-10-CM | POA: Diagnosis not present

## 2021-04-20 DIAGNOSIS — R29898 Other symptoms and signs involving the musculoskeletal system: Secondary | ICD-10-CM | POA: Diagnosis not present

## 2021-04-20 DIAGNOSIS — Z9181 History of falling: Secondary | ICD-10-CM | POA: Diagnosis not present

## 2021-04-20 DIAGNOSIS — S8254XD Nondisplaced fracture of medial malleolus of right tibia, subsequent encounter for closed fracture with routine healing: Secondary | ICD-10-CM | POA: Diagnosis not present

## 2021-04-20 DIAGNOSIS — R41841 Cognitive communication deficit: Secondary | ICD-10-CM | POA: Diagnosis not present

## 2021-04-20 DIAGNOSIS — S82891D Other fracture of right lower leg, subsequent encounter for closed fracture with routine healing: Secondary | ICD-10-CM | POA: Diagnosis not present

## 2021-04-21 DIAGNOSIS — I872 Venous insufficiency (chronic) (peripheral): Secondary | ICD-10-CM | POA: Diagnosis not present

## 2021-04-21 DIAGNOSIS — S8254XD Nondisplaced fracture of medial malleolus of right tibia, subsequent encounter for closed fracture with routine healing: Secondary | ICD-10-CM | POA: Diagnosis not present

## 2021-04-21 DIAGNOSIS — S82891D Other fracture of right lower leg, subsequent encounter for closed fracture with routine healing: Secondary | ICD-10-CM | POA: Diagnosis not present

## 2021-04-21 DIAGNOSIS — L858 Other specified epidermal thickening: Secondary | ICD-10-CM | POA: Diagnosis not present

## 2021-04-21 DIAGNOSIS — R41841 Cognitive communication deficit: Secondary | ICD-10-CM | POA: Diagnosis not present

## 2021-04-21 DIAGNOSIS — R29898 Other symptoms and signs involving the musculoskeletal system: Secondary | ICD-10-CM | POA: Diagnosis not present

## 2021-04-21 DIAGNOSIS — R2681 Unsteadiness on feet: Secondary | ICD-10-CM | POA: Diagnosis not present

## 2021-04-21 DIAGNOSIS — Z9181 History of falling: Secondary | ICD-10-CM | POA: Diagnosis not present

## 2021-04-21 DIAGNOSIS — C44702 Unspecified malignant neoplasm of skin of right lower limb, including hip: Secondary | ICD-10-CM | POA: Diagnosis not present

## 2021-04-21 DIAGNOSIS — D692 Other nonthrombocytopenic purpura: Secondary | ICD-10-CM | POA: Diagnosis not present

## 2021-04-23 ENCOUNTER — Encounter: Payer: Self-pay | Admitting: Nurse Practitioner

## 2021-04-23 ENCOUNTER — Other Ambulatory Visit: Payer: Self-pay

## 2021-04-23 ENCOUNTER — Non-Acute Institutional Stay: Payer: Medicare Other | Admitting: Nurse Practitioner

## 2021-04-23 DIAGNOSIS — K5901 Slow transit constipation: Secondary | ICD-10-CM | POA: Diagnosis not present

## 2021-04-23 DIAGNOSIS — L03116 Cellulitis of left lower limb: Secondary | ICD-10-CM | POA: Diagnosis not present

## 2021-04-23 DIAGNOSIS — R2681 Unsteadiness on feet: Secondary | ICD-10-CM | POA: Diagnosis not present

## 2021-04-23 DIAGNOSIS — R29898 Other symptoms and signs involving the musculoskeletal system: Secondary | ICD-10-CM | POA: Diagnosis not present

## 2021-04-23 DIAGNOSIS — N3942 Incontinence without sensory awareness: Secondary | ICD-10-CM

## 2021-04-23 DIAGNOSIS — S8254XD Nondisplaced fracture of medial malleolus of right tibia, subsequent encounter for closed fracture with routine healing: Secondary | ICD-10-CM | POA: Diagnosis not present

## 2021-04-23 DIAGNOSIS — S82891D Other fracture of right lower leg, subsequent encounter for closed fracture with routine healing: Secondary | ICD-10-CM | POA: Diagnosis not present

## 2021-04-23 DIAGNOSIS — D649 Anemia, unspecified: Secondary | ICD-10-CM | POA: Diagnosis not present

## 2021-04-23 DIAGNOSIS — I10 Essential (primary) hypertension: Secondary | ICD-10-CM

## 2021-04-23 DIAGNOSIS — I872 Venous insufficiency (chronic) (peripheral): Secondary | ICD-10-CM | POA: Diagnosis not present

## 2021-04-23 DIAGNOSIS — R41841 Cognitive communication deficit: Secondary | ICD-10-CM | POA: Diagnosis not present

## 2021-04-23 DIAGNOSIS — N1831 Chronic kidney disease, stage 3a: Secondary | ICD-10-CM | POA: Diagnosis not present

## 2021-04-23 DIAGNOSIS — M8000XD Age-related osteoporosis with current pathological fracture, unspecified site, subsequent encounter for fracture with routine healing: Secondary | ICD-10-CM | POA: Diagnosis not present

## 2021-04-23 DIAGNOSIS — Z9181 History of falling: Secondary | ICD-10-CM | POA: Diagnosis not present

## 2021-04-23 DIAGNOSIS — I130 Hypertensive heart and chronic kidney disease with heart failure and stage 1 through stage 4 chronic kidney disease, or unspecified chronic kidney disease: Secondary | ICD-10-CM

## 2021-04-23 MED ORDER — POTASSIUM CHLORIDE CRYS ER 20 MEQ PO TBCR
40.0000 meq | EXTENDED_RELEASE_TABLET | Freq: Two times a day (BID) | ORAL | 0 refills | Status: DC
Start: 2021-04-23 — End: 2021-08-13

## 2021-04-23 MED ORDER — FUROSEMIDE 40 MG PO TABS
40.0000 mg | ORAL_TABLET | Freq: Every day | ORAL | 0 refills | Status: DC
Start: 2021-04-23 — End: 2021-07-20

## 2021-04-23 MED ORDER — ZINC OXIDE 20 % EX OINT: TOPICAL_OINTMENT | Freq: Every day | CUTANEOUS | 1 refills | Status: DC

## 2021-04-23 NOTE — Assessment & Plan Note (Signed)
chronic, on Furosemide, 12/19/20 venous US LLE negative DVT, BNP pending in setting of weight gain.

## 2021-04-23 NOTE — Assessment & Plan Note (Signed)
Anemia, Hgb 7.2 01/20/21>>8.1 02/12/21>>8.3 03/26/21, on Pantoprazole for GI protection, FOBT negative x3, normal Fe, Vit B12, Folate. On Fe. Pending f/u CBC/diff.

## 2021-04-23 NOTE — Assessment & Plan Note (Signed)
Stable,  taking Fibercon prn.

## 2021-04-23 NOTE — Assessment & Plan Note (Signed)
blood pressure is controlled, on Valsartan

## 2021-04-23 NOTE — Assessment & Plan Note (Signed)
12/19/20 venous US LLE negative DVT, slow healing, mild erythema and warmth in setting of edema, will increase Furosemide to 40mg  qd with Kcl 77meq qd x 7 days, apply 1% Hydrocortisone cream/Nystatin cream daily until healed.

## 2021-04-23 NOTE — Assessment & Plan Note (Signed)
Her urinary frequency/leakage, uses pads, it seems better since taking Myrbetriq in pm per Urology

## 2021-04-23 NOTE — Progress Notes (Signed)
Location:   clinic [facility]   Place of Service:  Clinic (12) Provider: Marlana Latus NP  Code Status: DNR Goals of Care: [place] 04/23/2021  Does Patient Have a Medical Advance Directive? Yes  Type of Paramedic of [place];Living will;Out of facility DNR (pink MOST or yellow form)  Does patient want to make changes to medical advance directive? No - Patient declined  Copy of [place] in Chart? Yes - validated most recent copy scanned in chart (See row information)  Pre-existing out of facility DNR order (yellow form or pink MOST form) Yellow form placed in chart (order not valid for inpatient use);Pink MOST form placed in chart (order not valid for inpatient use)     Chief Complaint  Patient presents with   Acute Visit    Left leg concerns.     HPI: Patient is a 87 y.o. female seen today for medical management of chronic diseases.       12/23/20 R ankle fracture ( Fracture of the medial malleolus. Nondisplaced fracture of the distal fibula. S/p ORIF, in brace, WBAT, ambulates with walker.  Anemia, Hgb 7.2 01/20/21>>8.1 02/12/21>>8.3 03/26/21, on Pantoprazole for GI protection, FOBT negative x3, normal Fe, Vit B12, Folate. On Fe Edema BLE, chronic, on Furosemide, 12/19/20 venous US LLE negative DVT CKD Bun/creat 28/1.6 03/03/21, pending nephrology referral.              Her urinary frequency/leakage, uses pads, it seems better since taking Myrbetriq in pm per Urology             HTN, blood pressure is controlled, on Valsartan             No constipation, taking Fibercon prn.               Osteoporosis, off Alendronate due to CKD, 11/05/20 DEXA t score -2.8  Past Medical History:  Diagnosis Date   Anemia    Chronic kidney disease    stage 3   GERD (gastroesophageal reflux disease)    Hypertension     Past Surgical History:  Procedure Laterality Date   ORIF ANKLE FRACTURE Right 01/13/2021   Procedure: OPEN TREATMENT OF RIGHT  TRIMALLEOLAR ANKLE FRACTURE WITHOUT POSTERIOR FIXATION, POSSIBLE SYNDESMOSIS;  Surgeon: Erle Crocker, MD;  Location: [facility];  Service: Orthopedics;  Laterality: Right;    Allergies  Allergen Reactions   Ace Inhibitors    Flagyl [Metronidazole]    Penicillins Swelling    facial    Allergies as of 04/23/2021       Reactions   Ace Inhibitors    Flagyl [metronidazole]    Penicillins Swelling   facial        Medication List        Accurate as of April 23, 2021 11:59 PM. If you have any questions, ask your nurse or doctor.          STOP taking these medications    nystatin powder Commonly known as: MYCOSTATIN/NYSTOP Stopped by: Jenika Chiem X Donnivan Villena, NP       TAKE these medications    acetaminophen 325 MG tablet Commonly known as: TYLENOL Take 2 tablets (650 mg total) by mouth every 6 (six) hours as needed.   Calcium Carb-Cholecalciferol 500-400 MG-UNIT Tabs Take 1 tablet by mouth daily.   cholecalciferol 25 MCG (1000 UNIT) tablet Commonly known as: VITAMIN D Take 2 tablets (2,000 Units total) by mouth daily.   ferrous sulfate 325 (65 FE)  MG tablet Take 1 tablet (325 mg total) by mouth daily with breakfast. MON, WED, FRI   furosemide 20 MG tablet Commonly known as: LASIX Take 1 tablet (20 mg total) by mouth daily. What changed: Another medication with the same name was added. Make sure you understand how and when to take each. Changed by: Kilynn Fitzsimmons X Alissa Pharr, NP   furosemide 40 MG tablet Commonly known as: LASIX Take 1 tablet (40 mg total) by mouth daily. What changed: You were already taking a medication with the same name, and this prescription was added. Make sure you understand how and when to take each. Changed by: Shuntell Foody X Reilly Blades, NP   hydrocortisone 2.5%-nystatin-zinc oxide 20% 1:1:1 ointment mixture Apply topically daily. Started by: Travion Ke X Tavaughn Silguero, NP   ICAPS AREDS 2 PO Take by mouth 2 (two) times daily.   mirabegron ER 25 MG Tb24 tablet Commonly known as:  MYRBETRIQ Take 2 tablets (50 mg total) by mouth daily.   pantoprazole 40 MG tablet Commonly known as: PROTONIX Take 1 tablet (40 mg total) by mouth daily.   polycarbophil 625 MG tablet Commonly known as: FIBERCON Take 1 tablet (625 mg total) by mouth as needed.   potassium chloride SA 20 MEQ tablet Commonly known as: KLOR-CON Take 1 tablet (20 mEq total) by mouth daily. What changed: Another medication with the same name was added. Make sure you understand how and when to take each. Changed by: Karrina Lye X Angell Honse, NP   potassium chloride SA 20 MEQ tablet Commonly known as: KLOR-CON Take 2 tablets (40 mEq total) by mouth 2 (two) times daily. What changed: You were already taking a medication with the same name, and this prescription was added. Make sure you understand how and when to take each. Changed by: Kaytlyn Din X Adal Sereno, NP   valsartan 160 MG tablet Commonly known as: DIOVAN Take 1 tablet (160 mg total) by mouth daily.   vitamin C 250 MG tablet Commonly known as: ASCORBIC ACID Take 1 tablet (250 mg total) by mouth daily.        Review of Systems:  Review of Systems  Constitutional:  Positive for unexpected weight change. Negative for fatigue and fever.  HENT:  Positive for hearing loss. Negative for congestion and voice change.   Eyes:  Negative for visual disturbance.  Respiratory:  Negative for cough and shortness of breath.   Cardiovascular:  Positive for leg swelling.  Gastrointestinal:  Negative for abdominal pain and constipation.  Genitourinary:  Positive for frequency. Negative for dysuria and urgency.       Occasionally incontinent of urine. Urination average every 4 hrs during day 1x/night.   Musculoskeletal:  Positive for arthralgias and gait problem.       Right lower leg/ankle brace  Skin:  Positive for wound.       Left calf skin tear is near healed.   Neurological:  Negative for speech difficulty, weakness and light-headedness.       Memory lapses.    Psychiatric/Behavioral:  Negative for behavioral problems and sleep disturbance. The patient is not nervous/anxious.    Health Maintenance  Topic Date Due   Zoster Vaccines- Shingrix (1 of 2) Never done   COVID-19 Vaccine (3 - Moderna risk series) 11/25/2020   TETANUS/TDAP  11/26/2029   Pneumonia Vaccine 74+ Years old  Completed   INFLUENZA VACCINE  Completed   DEXA SCAN  Completed   HPV VACCINES  Aged Out    Physical Exam: Vitals:   04/23/21 [id number]  BP: (!) 158/76  Pulse: 90  Temp: 97.8 F (36.6 C)  SpO2: 98%  Weight: 120 lb (54.4 kg)  Height: 5' 4.5" (1.638 m)   Body mass index is 20.28 kg/m. Physical Exam Vitals reviewed.  Constitutional:      Appearance: Normal appearance.  HENT:     Head: Normocephalic and atraumatic.     Nose: Nose normal.     Mouth/Throat:     Mouth: Mucous membranes are moist.  Eyes:     Extraocular Movements: Extraocular movements intact.     Conjunctiva/sclera: Conjunctivae normal.     Pupils: Pupils are equal, round, and reactive to light.  Cardiovascular:     Rate and Rhythm: Normal rate and regular rhythm.     Heart sounds: No murmur heard. Pulmonary:     Breath sounds: No rales.  Abdominal:     General: Bowel sounds are normal.     Palpations: Abdomen is soft.     Tenderness: There is no abdominal tenderness.  Musculoskeletal:        General: No tenderness.     Cervical back: Normal range of motion and neck supple.     Right lower leg: Edema present.     Left lower leg: Edema present.     Comments: Right ankle in brace, s/p ORIF 01/13/21. Edema BLE 1-2+R>L  Skin:    General: Skin is warm and dry.     Findings: No bruising.     Comments: Chronic pigmented venous insufficiency skin changes BLE. Left calf skin tear is slow healing.   Neurological:     Mental Status: She is alert. Mental status is at baseline.     Gait: Gait abnormal.     Comments: Oriented to person, place.   Psychiatric:        Mood and Affect: Mood normal.         Behavior: Behavior normal.        Thought Content: Thought content normal.    Labs reviewed: Basic Metabolic Panel: Recent Labs    11/20/20 0730 12/08/20 0828 12/20/20 0000 12/23/20 0000 01/13/21 0741 03/03/21 0000  NA 138 136   < > 136* 135 138  K 4.2 4.6   < > 5.2 3.9 4.4  CL 102 102   < > 101 100 104  CO2 26 22   < > 20 26 28*  GLUCOSE 78 79  --   --  85  --   BUN 44* 42*   < > 41* 23 31*  CREATININE 1.89* 1.92*   < > 1.7* 1.57* 1.6*  CALCIUM 10.4 10.3   < > 9.8 9.4 9.5  TSH 2.48  --   --   --   --   --    < > = values in this interval not displayed.   Liver Function Tests: Recent Labs    11/20/20 0730 12/08/20 0828 12/20/20 0000 12/23/20 0000 01/13/21 0741  AST 20 21 20 26 22   ALT 15 12 15 13 17   ALKPHOS  --   --  52 57 63  BILITOT 0.5 0.4  --   --  0.7  PROT 6.7 6.7  --   --  6.0*  ALBUMIN  --   --  4.2 4.2 3.3*   No results for input(s): LIPASE, AMYLASE in the last 8760 hours. No results for input(s): AMMONIA in the last 8760 hours. CBC: Recent Labs    11/20/20 0730 12/08/20 0828 12/20/20 0000 01/13/21 0741 01/22/21 0000  02/05/21 0000 02/12/21 0000  WBC 7.3 6.5   < > 5.9 6.5 5.7 5.7  NEUTROABS 3,614 3,244   < >  --  3,634.00 3,386.00 3,392.00  HGB 9.0* 8.6*   < > 8.0* 8.0* 7.7* 8.1*  HCT 27.1* 26.6*   < > 25.5* 24* 24* 25*  MCV 96.1 98.5  --  100.8*  --   --   --   PLT 228 180   < > 241 251 212 217   < > = values in this interval not displayed.   Lipid Panel: No results for input(s): CHOL, HDL, LDLCALC, TRIG, CHOLHDL, LDLDIRECT in the last 8760 hours. No results found for: HGBA1C  Procedures since last visit: No results found.  Assessment/Plan  Closed nondisp fracture of right medial malleolus with routine healing 12/23/20 R ankle fracture ( Fracture of the medial malleolus. Nondisplaced fracture of the distal fibula. S/p ORIF, in brace, WBAT, ambulates with walker.   Anemia Anemia, Hgb 7.2 01/20/21>>8.1 02/12/21>>8.3 03/26/21, on  Pantoprazole for GI protection, FOBT negative x3, normal Fe, Vit B12, Folate. On Fe. Pending f/u CBC/diff.   Venous insufficiency (chronic) (peripheral)  chronic, on Furosemide, 12/19/20 venous US LLE negative DVT, BNP pending in setting of weight gain.   CKD (chronic kidney disease) stage 3, GFR 30-59 ml/min (HCC) Bun/creat 28/1.6 03/03/21, pending nephrology referral.   Incontinent of urine Her urinary frequency/leakage, uses pads, it seems better since taking Myrbetriq in pm per Urology  Hypertension blood pressure is controlled, on Valsartan  Slow transit constipation Stable,  taking Fibercon prn.   Osteoporosis off Alendronate due to CKD, 11/05/20 DEXA t score -2.8  Cellulitis of left lower leg 12/19/20 venous US LLE negative DVT, slow healing, mild erythema and warmth in setting of edema, will increase Furosemide to 40mg  qd with Kcl 33meq qd x 7 days, apply 1% Hydrocortisone cream/Nystatin cream daily until healed.    Labs/tests ordered:  none  Next appt:  4 weeks.

## 2021-04-23 NOTE — Assessment & Plan Note (Signed)
12/23/20 R ankle fracture ( Fracture of the medial malleolus. Nondisplaced fracture of the distal fibula. S/p ORIF, in brace, WBAT, ambulates with walker.

## 2021-04-23 NOTE — Assessment & Plan Note (Signed)
off Alendronate due to CKD, 11/05/20 DEXA t score -2.8

## 2021-04-23 NOTE — Assessment & Plan Note (Signed)
Bun/creat 28/1.6 03/03/21, pending nephrology referral.

## 2021-04-24 ENCOUNTER — Encounter: Payer: Self-pay | Admitting: Nurse Practitioner

## 2021-04-24 DIAGNOSIS — S8254XD Nondisplaced fracture of medial malleolus of right tibia, subsequent encounter for closed fracture with routine healing: Secondary | ICD-10-CM | POA: Diagnosis not present

## 2021-04-24 DIAGNOSIS — Z9181 History of falling: Secondary | ICD-10-CM | POA: Diagnosis not present

## 2021-04-24 DIAGNOSIS — S82891D Other fracture of right lower leg, subsequent encounter for closed fracture with routine healing: Secondary | ICD-10-CM | POA: Diagnosis not present

## 2021-04-24 DIAGNOSIS — R2681 Unsteadiness on feet: Secondary | ICD-10-CM | POA: Diagnosis not present

## 2021-04-24 DIAGNOSIS — R41841 Cognitive communication deficit: Secondary | ICD-10-CM | POA: Diagnosis not present

## 2021-04-24 DIAGNOSIS — R29898 Other symptoms and signs involving the musculoskeletal system: Secondary | ICD-10-CM | POA: Diagnosis not present

## 2021-04-27 DIAGNOSIS — Z9181 History of falling: Secondary | ICD-10-CM | POA: Diagnosis not present

## 2021-04-27 DIAGNOSIS — R29898 Other symptoms and signs involving the musculoskeletal system: Secondary | ICD-10-CM | POA: Diagnosis not present

## 2021-04-27 DIAGNOSIS — S8254XD Nondisplaced fracture of medial malleolus of right tibia, subsequent encounter for closed fracture with routine healing: Secondary | ICD-10-CM | POA: Diagnosis not present

## 2021-04-27 DIAGNOSIS — R2681 Unsteadiness on feet: Secondary | ICD-10-CM | POA: Diagnosis not present

## 2021-04-27 DIAGNOSIS — R41841 Cognitive communication deficit: Secondary | ICD-10-CM | POA: Diagnosis not present

## 2021-04-27 DIAGNOSIS — S82891D Other fracture of right lower leg, subsequent encounter for closed fracture with routine healing: Secondary | ICD-10-CM | POA: Diagnosis not present

## 2021-04-28 DIAGNOSIS — R41841 Cognitive communication deficit: Secondary | ICD-10-CM | POA: Diagnosis not present

## 2021-04-28 DIAGNOSIS — R2681 Unsteadiness on feet: Secondary | ICD-10-CM | POA: Diagnosis not present

## 2021-04-28 DIAGNOSIS — S82891D Other fracture of right lower leg, subsequent encounter for closed fracture with routine healing: Secondary | ICD-10-CM | POA: Diagnosis not present

## 2021-04-28 DIAGNOSIS — S8254XD Nondisplaced fracture of medial malleolus of right tibia, subsequent encounter for closed fracture with routine healing: Secondary | ICD-10-CM | POA: Diagnosis not present

## 2021-04-28 DIAGNOSIS — R29898 Other symptoms and signs involving the musculoskeletal system: Secondary | ICD-10-CM | POA: Diagnosis not present

## 2021-04-28 DIAGNOSIS — Z9181 History of falling: Secondary | ICD-10-CM | POA: Diagnosis not present

## 2021-04-30 ENCOUNTER — Non-Acute Institutional Stay: Payer: Medicare Other | Admitting: Nurse Practitioner

## 2021-04-30 ENCOUNTER — Encounter: Payer: Self-pay | Admitting: Nurse Practitioner

## 2021-04-30 ENCOUNTER — Other Ambulatory Visit: Payer: Self-pay

## 2021-04-30 DIAGNOSIS — D649 Anemia, unspecified: Secondary | ICD-10-CM | POA: Diagnosis not present

## 2021-04-30 DIAGNOSIS — N3942 Incontinence without sensory awareness: Secondary | ICD-10-CM | POA: Diagnosis not present

## 2021-04-30 DIAGNOSIS — S81802A Unspecified open wound, left lower leg, initial encounter: Secondary | ICD-10-CM | POA: Insufficient documentation

## 2021-04-30 DIAGNOSIS — I872 Venous insufficiency (chronic) (peripheral): Secondary | ICD-10-CM

## 2021-04-30 DIAGNOSIS — I10 Essential (primary) hypertension: Secondary | ICD-10-CM | POA: Diagnosis not present

## 2021-04-30 DIAGNOSIS — M8000XD Age-related osteoporosis with current pathological fracture, unspecified site, subsequent encounter for fracture with routine healing: Secondary | ICD-10-CM

## 2021-04-30 DIAGNOSIS — R41841 Cognitive communication deficit: Secondary | ICD-10-CM | POA: Diagnosis not present

## 2021-04-30 DIAGNOSIS — K5901 Slow transit constipation: Secondary | ICD-10-CM

## 2021-04-30 DIAGNOSIS — N1831 Chronic kidney disease, stage 3a: Secondary | ICD-10-CM

## 2021-04-30 DIAGNOSIS — S8254XD Nondisplaced fracture of medial malleolus of right tibia, subsequent encounter for closed fracture with routine healing: Secondary | ICD-10-CM

## 2021-04-30 DIAGNOSIS — R2681 Unsteadiness on feet: Secondary | ICD-10-CM | POA: Diagnosis not present

## 2021-04-30 DIAGNOSIS — S81802D Unspecified open wound, left lower leg, subsequent encounter: Secondary | ICD-10-CM

## 2021-04-30 DIAGNOSIS — R29898 Other symptoms and signs involving the musculoskeletal system: Secondary | ICD-10-CM | POA: Diagnosis not present

## 2021-04-30 DIAGNOSIS — Z9181 History of falling: Secondary | ICD-10-CM | POA: Diagnosis not present

## 2021-04-30 DIAGNOSIS — S82891D Other fracture of right lower leg, subsequent encounter for closed fracture with routine healing: Secondary | ICD-10-CM | POA: Diagnosis not present

## 2021-04-30 NOTE — Assessment & Plan Note (Signed)
Bun/creat 28/1.6 03/03/21, pending nephrology referral.

## 2021-04-30 NOTE — Assessment & Plan Note (Signed)
12/23/20 R ankle fracture ( Fracture of the medial malleolus. Nondisplaced fracture of the distal fibula. S/p ORIF, in brace, WBAT, ambulates with walker.

## 2021-04-30 NOTE — Assessment & Plan Note (Signed)
Hgb 7.2 01/20/21>>8.1 02/12/21>>8.3 03/26/21, on Pantoprazole for GI protection, FOBT negative x3, normal Fe, Vit B12, Folate. On Fe

## 2021-04-30 NOTE — Progress Notes (Signed)
Location:   clinic [place]   Place of Service:  Clinic (12) Provider: Marlana Latus NP  Code Status: DNR Goals of Care: [place] 04/23/2021  Does Patient Have a Medical Advance Directive? Yes  Type of Paramedic of [place];Living will;Out of facility DNR (pink MOST or yellow form)  Does patient want to make changes to medical advance directive? No - Patient declined  Copy of [place] in Chart? Yes - validated most recent copy scanned in chart (See row information)  Pre-existing out of facility DNR order (yellow form or pink MOST form) Yellow form placed in chart (order not valid for inpatient use);Pink MOST form placed in chart (order not valid for inpatient use)     Chief Complaint  Patient presents with   Medical Management of Chronic Issues    Patient returned to the clinic for her one week follow up.      HPI: Patient is a 87 y.o. female seen today for medical management of chronic diseases.    LLE, slow healing wound, increased Furosemide to 40mg  x 1wk, applying Nystatin/Hydrocortisone presently.   12/23/20 R ankle fracture ( Fracture of the medial malleolus. Nondisplaced fracture of the distal fibula. S/p ORIF, in brace, WBAT, ambulates with walker.  Anemia, Hgb 7.2 01/20/21>>8.1 02/12/21>>8.3 03/26/21, on Pantoprazole for GI protection, FOBT negative x3, normal Fe, Vit B12, Folate. On Fe Edema BLE, chronic, on Furosemide, 12/19/20 venous US LLE negative DVT CKD Bun/creat 28/1.6 03/03/21, pending nephrology referral.              Her urinary frequency/leakage, uses pads, it seems better since taking Myrbetriq in pm per Urology             HTN, blood pressure is controlled, on Valsartan             No constipation, taking Fibercon prn.               Osteoporosis, off Alendronate due to CKD, 11/05/20 DEXA t score -2.8   Past Medical History:  Diagnosis Date   Anemia    Chronic kidney disease    stage 3   GERD  (gastroesophageal reflux disease)    Hypertension     Past Surgical History:  Procedure Laterality Date   ORIF ANKLE FRACTURE Right 01/13/2021   Procedure: OPEN TREATMENT OF RIGHT TRIMALLEOLAR ANKLE FRACTURE WITHOUT POSTERIOR FIXATION, POSSIBLE SYNDESMOSIS;  Surgeon: Erle Crocker, MD;  Location: [facility];  Service: Orthopedics;  Laterality: Right;    Allergies  Allergen Reactions   Ace Inhibitors    Flagyl [Metronidazole]    Penicillins Swelling    facial    Allergies as of 04/30/2021       Reactions   Ace Inhibitors    Flagyl [metronidazole]    Penicillins Swelling   facial        Medication List        Accurate as of April 30, 2021 11:59 PM. If you have any questions, ask your nurse or doctor.          acetaminophen 325 MG tablet Commonly known as: TYLENOL Take 2 tablets (650 mg total) by mouth every 6 (six) hours as needed.   Calcium Carb-Cholecalciferol 500-400 MG-UNIT Tabs Take 1 tablet by mouth daily.   cholecalciferol 25 MCG (1000 UNIT) tablet Commonly known as: VITAMIN D Take 2 tablets (2,000 Units total) by mouth daily.   ferrous sulfate 325 (65 FE) MG tablet Take 1 tablet (325  mg total) by mouth daily with breakfast. MON, WED, FRI   furosemide 20 MG tablet Commonly known as: LASIX Take 1 tablet (20 mg total) by mouth daily.   furosemide 40 MG tablet Commonly known as: LASIX Take 1 tablet (40 mg total) by mouth daily.   hydrocortisone 2.5%-nystatin-zinc oxide 20% 1:1:1 ointment mixture Apply topically daily.   ICAPS AREDS 2 PO Take by mouth 2 (two) times daily.   mirabegron ER 25 MG Tb24 tablet Commonly known as: MYRBETRIQ Take 2 tablets (50 mg total) by mouth daily.   pantoprazole 40 MG tablet Commonly known as: PROTONIX Take 1 tablet (40 mg total) by mouth daily.   polycarbophil 625 MG tablet Commonly known as: FIBERCON Take 1 tablet (625 mg total) by mouth as needed.   potassium chloride SA 20 MEQ tablet Commonly known  as: KLOR-CON Take 1 tablet (20 mEq total) by mouth daily.   potassium chloride SA 20 MEQ tablet Commonly known as: KLOR-CON Take 2 tablets (40 mEq total) by mouth 2 (two) times daily.   valsartan 160 MG tablet Commonly known as: DIOVAN Take 1 tablet (160 mg total) by mouth daily.   vitamin C 250 MG tablet Commonly known as: ASCORBIC ACID Take 1 tablet (250 mg total) by mouth daily.        Review of Systems:  Review of Systems  Constitutional:  Negative for fatigue, fever and unexpected weight change.  HENT:  Positive for hearing loss. Negative for congestion and voice change.   Eyes:  Negative for visual disturbance.  Respiratory:  Negative for cough and shortness of breath.   Cardiovascular:  Positive for leg swelling.  Gastrointestinal:  Negative for abdominal pain and constipation.  Genitourinary:  Positive for frequency. Negative for dysuria and urgency.       Occasionally incontinent of urine. Urination average every 4 hrs during day 1x/night.   Musculoskeletal:  Positive for arthralgias and gait problem.       Right lower leg/ankle brace  Skin:  Negative for color change.       Left calf skin tear is near healed.   Neurological:  Negative for speech difficulty, weakness and light-headedness.       Memory lapses.   Psychiatric/Behavioral:  Negative for behavioral problems and sleep disturbance. The patient is not nervous/anxious.    Health Maintenance  Topic Date Due   Zoster Vaccines- Shingrix (1 of 2) Never done   COVID-19 Vaccine (3 - Moderna risk series) 11/25/2020   TETANUS/TDAP  11/26/2029   Pneumonia Vaccine 41+ Years old  Completed   INFLUENZA VACCINE  Completed   DEXA SCAN  Completed   HPV VACCINES  Aged Out    Physical Exam: Vitals:   04/30/21 1420  BP: (!) 148/82  Pulse: 81  Temp: (!) 97.5 F (36.4 C)  SpO2: 97%  Weight: 119 lb 9.6 oz (54.3 kg)  Height: 5' 4.5" (1.638 m)   Body mass index is 20.21 kg/m. Physical Exam Vitals reviewed.   Constitutional:      Appearance: Normal appearance.  HENT:     Head: Normocephalic and atraumatic.     Nose: Nose normal.     Mouth/Throat:     Mouth: Mucous membranes are moist.  Eyes:     Extraocular Movements: Extraocular movements intact.     Conjunctiva/sclera: Conjunctivae normal.     Pupils: Pupils are equal, round, and reactive to light.  Cardiovascular:     Rate and Rhythm: Normal rate and regular rhythm.  Heart sounds: No murmur heard. Pulmonary:     Breath sounds: No rales.  Abdominal:     General: Bowel sounds are normal.     Palpations: Abdomen is soft.     Tenderness: There is no abdominal tenderness.  Musculoskeletal:        General: No tenderness.     Cervical back: Normal range of motion and neck supple.     Right lower leg: Edema present.     Left lower leg: Edema present.     Comments: Right ankle in brace, s/p ORIF 01/13/21. Edema BLE 1+  Skin:    General: Skin is warm and dry.     Findings: No bruising.     Comments: Chronic pigmented venous insufficiency skin changes BLE. Left calf skin tear is healed, residual mild redness  Neurological:     Mental Status: She is alert. Mental status is at baseline.     Gait: Gait abnormal.     Comments: Oriented to person, place.   Psychiatric:        Mood and Affect: Mood normal.        Behavior: Behavior normal.        Thought Content: Thought content normal.    Labs reviewed: Basic Metabolic Panel: Recent Labs    11/20/20 0730 12/08/20 0828 12/20/20 0000 12/23/20 0000 01/13/21 0741 03/03/21 0000  NA 138 136   < > 136* 135 138  K 4.2 4.6   < > 5.2 3.9 4.4  CL 102 102   < > 101 100 104  CO2 26 22   < > 20 26 28*  GLUCOSE 78 79  --   --  85  --   BUN 44* 42*   < > 41* 23 31*  CREATININE 1.89* 1.92*   < > 1.7* 1.57* 1.6*  CALCIUM 10.4 10.3   < > 9.8 9.4 9.5  TSH 2.48  --   --   --   --   --    < > = values in this interval not displayed.   Liver Function Tests: Recent Labs    11/20/20 0730  12/08/20 0828 12/20/20 0000 12/23/20 0000 01/13/21 0741  AST 20 21 20 26 22   ALT 15 12 15 13 17   ALKPHOS  --   --  52 57 63  BILITOT 0.5 0.4  --   --  0.7  PROT 6.7 6.7  --   --  6.0*  ALBUMIN  --   --  4.2 4.2 3.3*   No results for input(s): LIPASE, AMYLASE in the last 8760 hours. No results for input(s): AMMONIA in the last 8760 hours. CBC: Recent Labs    11/20/20 0730 12/08/20 0828 12/20/20 0000 01/13/21 0741 01/22/21 0000 02/05/21 0000 02/12/21 0000  WBC 7.3 6.5   < > 5.9 6.5 5.7 5.7  NEUTROABS 3,614 3,244   < >  --  3,634.00 3,386.00 3,392.00  HGB 9.0* 8.6*   < > 8.0* 8.0* 7.7* 8.1*  HCT 27.1* 26.6*   < > 25.5* 24* 24* 25*  MCV 96.1 98.5  --  100.8*  --   --   --   PLT 228 180   < > 241 251 212 217   < > = values in this interval not displayed.   Lipid Panel: No results for input(s): CHOL, HDL, LDLCALC, TRIG, CHOLHDL, LDLDIRECT in the last 8760 hours. No results found for: HGBA1C  Procedures since last visit: No results found.  Assessment/Plan  Closed nondisp fracture of right medial malleolus with routine healing 12/23/20 R ankle fracture ( Fracture of the medial malleolus. Nondisplaced fracture of the distal fibula. S/p ORIF, in brace, WBAT, ambulates with walker.   Anemia Hgb 7.2 01/20/21>>8.1 02/12/21>>8.3 03/26/21, on Pantoprazole for GI protection, FOBT negative x3, normal Fe, Vit B12, Folate. On Fe  Venous insufficiency (chronic) (peripheral) chronic, on Furosemide, 12/19/20 venous US LLE negative DVT  CKD (chronic kidney disease) stage 3, GFR 30-59 ml/min (HCC) Bun/creat 28/1.6 03/03/21, pending nephrology referral.   Incontinent of urine Her urinary frequency/leakage, uses pads, it seems better since taking Myrbetriq in pm per Urology  Hypertension Loose blood pressure control for now, on Valsartan  Slow transit constipation No constipation, taking Fibercon prn.   Osteoporosis  off Alendronate due to CKD, 11/05/20 DEXA t score -2.8  Open wound of  left lower leg LLE, slow healing wound, increased Furosemide to 40mg  x 1wk, applying Nystatin/Hydrocortisone presently.    Labs/tests ordered:  none  Next appt:  05/12/2021

## 2021-04-30 NOTE — Assessment & Plan Note (Signed)
No constipation, taking Fibercon prn.

## 2021-04-30 NOTE — Assessment & Plan Note (Signed)
Her urinary frequency/leakage, uses pads, it seems better since taking Myrbetriq in pm per Urology

## 2021-04-30 NOTE — Assessment & Plan Note (Signed)
LLE, slow healing wound, increased Furosemide to 40mg  x 1wk, applying Nystatin/Hydrocortisone presently.

## 2021-04-30 NOTE — Assessment & Plan Note (Signed)
chronic, on Furosemide, 12/19/20 venous US LLE negative DVT

## 2021-04-30 NOTE — Assessment & Plan Note (Signed)
off Alendronate due to CKD, 11/05/20 DEXA t score -2.8

## 2021-04-30 NOTE — Assessment & Plan Note (Addendum)
Loose blood pressure control for now, on Valsartan

## 2021-05-01 ENCOUNTER — Encounter: Payer: Self-pay | Admitting: Nurse Practitioner

## 2021-05-01 DIAGNOSIS — S82891D Other fracture of right lower leg, subsequent encounter for closed fracture with routine healing: Secondary | ICD-10-CM | POA: Diagnosis not present

## 2021-05-01 DIAGNOSIS — Z9181 History of falling: Secondary | ICD-10-CM | POA: Diagnosis not present

## 2021-05-01 DIAGNOSIS — R41841 Cognitive communication deficit: Secondary | ICD-10-CM | POA: Diagnosis not present

## 2021-05-01 DIAGNOSIS — R29898 Other symptoms and signs involving the musculoskeletal system: Secondary | ICD-10-CM | POA: Diagnosis not present

## 2021-05-01 DIAGNOSIS — S8254XD Nondisplaced fracture of medial malleolus of right tibia, subsequent encounter for closed fracture with routine healing: Secondary | ICD-10-CM | POA: Diagnosis not present

## 2021-05-01 DIAGNOSIS — R2681 Unsteadiness on feet: Secondary | ICD-10-CM | POA: Diagnosis not present

## 2021-05-04 DIAGNOSIS — Z9181 History of falling: Secondary | ICD-10-CM | POA: Diagnosis not present

## 2021-05-04 DIAGNOSIS — R29898 Other symptoms and signs involving the musculoskeletal system: Secondary | ICD-10-CM | POA: Diagnosis not present

## 2021-05-04 DIAGNOSIS — R41841 Cognitive communication deficit: Secondary | ICD-10-CM | POA: Diagnosis not present

## 2021-05-04 DIAGNOSIS — S8254XD Nondisplaced fracture of medial malleolus of right tibia, subsequent encounter for closed fracture with routine healing: Secondary | ICD-10-CM | POA: Diagnosis not present

## 2021-05-04 DIAGNOSIS — S82891D Other fracture of right lower leg, subsequent encounter for closed fracture with routine healing: Secondary | ICD-10-CM | POA: Diagnosis not present

## 2021-05-04 DIAGNOSIS — R2681 Unsteadiness on feet: Secondary | ICD-10-CM | POA: Diagnosis not present

## 2021-05-05 DIAGNOSIS — R29898 Other symptoms and signs involving the musculoskeletal system: Secondary | ICD-10-CM | POA: Diagnosis not present

## 2021-05-05 DIAGNOSIS — Z9181 History of falling: Secondary | ICD-10-CM | POA: Diagnosis not present

## 2021-05-05 DIAGNOSIS — S82891D Other fracture of right lower leg, subsequent encounter for closed fracture with routine healing: Secondary | ICD-10-CM | POA: Diagnosis not present

## 2021-05-05 DIAGNOSIS — S8254XD Nondisplaced fracture of medial malleolus of right tibia, subsequent encounter for closed fracture with routine healing: Secondary | ICD-10-CM | POA: Diagnosis not present

## 2021-05-05 DIAGNOSIS — R41841 Cognitive communication deficit: Secondary | ICD-10-CM | POA: Diagnosis not present

## 2021-05-05 DIAGNOSIS — R2681 Unsteadiness on feet: Secondary | ICD-10-CM | POA: Diagnosis not present

## 2021-05-06 DIAGNOSIS — R2681 Unsteadiness on feet: Secondary | ICD-10-CM | POA: Diagnosis not present

## 2021-05-06 DIAGNOSIS — R29898 Other symptoms and signs involving the musculoskeletal system: Secondary | ICD-10-CM | POA: Diagnosis not present

## 2021-05-06 DIAGNOSIS — S8254XD Nondisplaced fracture of medial malleolus of right tibia, subsequent encounter for closed fracture with routine healing: Secondary | ICD-10-CM | POA: Diagnosis not present

## 2021-05-06 DIAGNOSIS — Z9181 History of falling: Secondary | ICD-10-CM | POA: Diagnosis not present

## 2021-05-06 DIAGNOSIS — R41841 Cognitive communication deficit: Secondary | ICD-10-CM | POA: Diagnosis not present

## 2021-05-06 DIAGNOSIS — S82891D Other fracture of right lower leg, subsequent encounter for closed fracture with routine healing: Secondary | ICD-10-CM | POA: Diagnosis not present

## 2021-05-08 DIAGNOSIS — S82891D Other fracture of right lower leg, subsequent encounter for closed fracture with routine healing: Secondary | ICD-10-CM | POA: Diagnosis not present

## 2021-05-08 DIAGNOSIS — Z9181 History of falling: Secondary | ICD-10-CM | POA: Diagnosis not present

## 2021-05-08 DIAGNOSIS — R2681 Unsteadiness on feet: Secondary | ICD-10-CM | POA: Diagnosis not present

## 2021-05-08 DIAGNOSIS — S8254XD Nondisplaced fracture of medial malleolus of right tibia, subsequent encounter for closed fracture with routine healing: Secondary | ICD-10-CM | POA: Diagnosis not present

## 2021-05-08 DIAGNOSIS — R41841 Cognitive communication deficit: Secondary | ICD-10-CM | POA: Diagnosis not present

## 2021-05-08 DIAGNOSIS — R29898 Other symptoms and signs involving the musculoskeletal system: Secondary | ICD-10-CM | POA: Diagnosis not present

## 2021-05-11 DIAGNOSIS — Z9181 History of falling: Secondary | ICD-10-CM | POA: Diagnosis not present

## 2021-05-11 DIAGNOSIS — R29898 Other symptoms and signs involving the musculoskeletal system: Secondary | ICD-10-CM | POA: Diagnosis not present

## 2021-05-11 DIAGNOSIS — S82891D Other fracture of right lower leg, subsequent encounter for closed fracture with routine healing: Secondary | ICD-10-CM | POA: Diagnosis not present

## 2021-05-11 DIAGNOSIS — S8254XD Nondisplaced fracture of medial malleolus of right tibia, subsequent encounter for closed fracture with routine healing: Secondary | ICD-10-CM | POA: Diagnosis not present

## 2021-05-11 DIAGNOSIS — R41841 Cognitive communication deficit: Secondary | ICD-10-CM | POA: Diagnosis not present

## 2021-05-11 DIAGNOSIS — R2681 Unsteadiness on feet: Secondary | ICD-10-CM | POA: Diagnosis not present

## 2021-05-12 ENCOUNTER — Other Ambulatory Visit: Payer: Self-pay

## 2021-05-12 ENCOUNTER — Other Ambulatory Visit: Payer: Medicare Other

## 2021-05-12 DIAGNOSIS — R2681 Unsteadiness on feet: Secondary | ICD-10-CM | POA: Diagnosis not present

## 2021-05-12 DIAGNOSIS — N1831 Chronic kidney disease, stage 3a: Secondary | ICD-10-CM | POA: Diagnosis not present

## 2021-05-12 DIAGNOSIS — Z9181 History of falling: Secondary | ICD-10-CM | POA: Diagnosis not present

## 2021-05-12 DIAGNOSIS — D649 Anemia, unspecified: Secondary | ICD-10-CM | POA: Diagnosis not present

## 2021-05-12 DIAGNOSIS — S82891D Other fracture of right lower leg, subsequent encounter for closed fracture with routine healing: Secondary | ICD-10-CM | POA: Diagnosis not present

## 2021-05-12 DIAGNOSIS — R41841 Cognitive communication deficit: Secondary | ICD-10-CM | POA: Diagnosis not present

## 2021-05-12 DIAGNOSIS — I130 Hypertensive heart and chronic kidney disease with heart failure and stage 1 through stage 4 chronic kidney disease, or unspecified chronic kidney disease: Secondary | ICD-10-CM | POA: Diagnosis not present

## 2021-05-12 DIAGNOSIS — R29898 Other symptoms and signs involving the musculoskeletal system: Secondary | ICD-10-CM | POA: Diagnosis not present

## 2021-05-12 DIAGNOSIS — I872 Venous insufficiency (chronic) (peripheral): Secondary | ICD-10-CM | POA: Diagnosis not present

## 2021-05-12 DIAGNOSIS — S8254XD Nondisplaced fracture of medial malleolus of right tibia, subsequent encounter for closed fracture with routine healing: Secondary | ICD-10-CM | POA: Diagnosis not present

## 2021-05-12 LAB — COMPLETE METABOLIC PANEL WITH GFR
AG Ratio: 1.6 (calc) (ref 1.0–2.5)
ALT: 12 U/L (ref 6–29)
AST: 19 U/L (ref 10–35)
Albumin: 4 g/dL (ref 3.6–5.1)
Alkaline phosphatase (APISO): 64 U/L (ref 37–153)
BUN/Creatinine Ratio: 19 (calc) (ref 6–22)
BUN: 33 mg/dL — ABNORMAL HIGH (ref 7–25)
CO2: 26 mmol/L (ref 20–32)
Calcium: 9.5 mg/dL (ref 8.6–10.4)
Chloride: 104 mmol/L (ref 98–110)
Creat: 1.7 mg/dL — ABNORMAL HIGH (ref 0.60–0.95)
Globulin: 2.5 g/dL (calc) (ref 1.9–3.7)
Glucose, Bld: 86 mg/dL (ref 65–99)
Potassium: 4.3 mmol/L (ref 3.5–5.3)
Sodium: 138 mmol/L (ref 135–146)
Total Bilirubin: 0.4 mg/dL (ref 0.2–1.2)
Total Protein: 6.5 g/dL (ref 6.1–8.1)
eGFR: 28 mL/min/{1.73_m2} — ABNORMAL LOW (ref >=60)

## 2021-05-12 LAB — CBC WITH DIFFERENTIAL/PLATELET
Absolute Monocytes: 490 cells/uL (ref 200–950)
Basophils Absolute: 71 cells/uL (ref 0–200)
Basophils Relative: 1.2 %
Eosinophils Absolute: 431 cells/uL (ref 15–500)
Eosinophils Relative: 7.3 %
HCT: 28.2 % — ABNORMAL LOW (ref 35.0–45.0)
Hemoglobin: 9.3 g/dL — ABNORMAL LOW (ref 11.7–15.5)
Lymphs Abs: 1617 cells/uL (ref 850–3900)
MCH: 31.5 pg (ref 27.0–33.0)
MCHC: 33 g/dL (ref 32.0–36.0)
MCV: 95.6 fL (ref 80.0–100.0)
MPV: 10.9 fL (ref 7.5–12.5)
Monocytes Relative: 8.3 %
Neutro Abs: 3292 cells/uL (ref 1500–7800)
Neutrophils Relative %: 55.8 %
Platelets: 246 10*3/uL (ref 140–400)
RBC: 2.95 10*6/uL — ABNORMAL LOW (ref 3.80–5.10)
RDW: 11.8 % (ref 11.0–15.0)
Total Lymphocyte: 27.4 %
WBC: 5.9 10*3/uL (ref 3.8–10.8)

## 2021-05-12 LAB — BRAIN NATRIURETIC PEPTIDE: Brain Natriuretic Peptide: 109 pg/mL — ABNORMAL HIGH (ref <100)

## 2021-05-14 ENCOUNTER — Encounter: Payer: Self-pay | Admitting: Nurse Practitioner

## 2021-05-14 ENCOUNTER — Other Ambulatory Visit: Payer: Self-pay

## 2021-05-14 ENCOUNTER — Non-Acute Institutional Stay: Payer: Medicare Other | Admitting: Nurse Practitioner

## 2021-05-14 DIAGNOSIS — D649 Anemia, unspecified: Secondary | ICD-10-CM

## 2021-05-14 DIAGNOSIS — M25671 Stiffness of right ankle, not elsewhere classified: Secondary | ICD-10-CM | POA: Diagnosis not present

## 2021-05-14 DIAGNOSIS — I872 Venous insufficiency (chronic) (peripheral): Secondary | ICD-10-CM | POA: Diagnosis not present

## 2021-05-14 DIAGNOSIS — R2681 Unsteadiness on feet: Secondary | ICD-10-CM | POA: Diagnosis not present

## 2021-05-14 DIAGNOSIS — N1831 Chronic kidney disease, stage 3a: Secondary | ICD-10-CM

## 2021-05-14 DIAGNOSIS — K5901 Slow transit constipation: Secondary | ICD-10-CM | POA: Diagnosis not present

## 2021-05-14 DIAGNOSIS — R29898 Other symptoms and signs involving the musculoskeletal system: Secondary | ICD-10-CM | POA: Diagnosis not present

## 2021-05-14 DIAGNOSIS — N3942 Incontinence without sensory awareness: Secondary | ICD-10-CM | POA: Diagnosis not present

## 2021-05-14 DIAGNOSIS — R41841 Cognitive communication deficit: Secondary | ICD-10-CM | POA: Diagnosis not present

## 2021-05-14 DIAGNOSIS — M8000XD Age-related osteoporosis with current pathological fracture, unspecified site, subsequent encounter for fracture with routine healing: Secondary | ICD-10-CM

## 2021-05-14 DIAGNOSIS — I10 Essential (primary) hypertension: Secondary | ICD-10-CM

## 2021-05-14 DIAGNOSIS — Z9181 History of falling: Secondary | ICD-10-CM | POA: Diagnosis not present

## 2021-05-14 DIAGNOSIS — S8254XD Nondisplaced fracture of medial malleolus of right tibia, subsequent encounter for closed fracture with routine healing: Secondary | ICD-10-CM | POA: Diagnosis not present

## 2021-05-14 DIAGNOSIS — S81802D Unspecified open wound, left lower leg, subsequent encounter: Secondary | ICD-10-CM | POA: Diagnosis not present

## 2021-05-14 DIAGNOSIS — M6281 Muscle weakness (generalized): Secondary | ICD-10-CM | POA: Diagnosis not present

## 2021-05-14 DIAGNOSIS — S82891D Other fracture of right lower leg, subsequent encounter for closed fracture with routine healing: Secondary | ICD-10-CM | POA: Diagnosis not present

## 2021-05-14 NOTE — Assessment & Plan Note (Signed)
healing

## 2021-05-14 NOTE — Assessment & Plan Note (Signed)
off Alendronate due to CKD, 11/05/20 DEXA t score -2.8

## 2021-05-14 NOTE — Progress Notes (Signed)
Location:   clinic [place]   Place of Service:  Clinic (12) Provider: Marlana Latus NP  Code Status: DNR Goals of Care: [place] 05/14/2021  Does Patient Have a Medical Advance Directive? Yes  Type of Paramedic of [place];Living will;Out of facility DNR (pink MOST or yellow form)  Does patient want to make changes to medical advance directive? No - Patient declined  Copy of [place] in Chart? Yes - validated most recent copy scanned in chart (See row information)  Pre-existing out of facility DNR order (yellow form or pink MOST form) Yellow form placed in chart (order not valid for inpatient use);Pink MOST form placed in chart (order not valid for inpatient use)     Chief Complaint  Patient presents with   Follow-up    4 week follow-up and discuss labs (copy printed). Discuss need for shingrix and covid vaccines or postpone/exclude if patient refuses.      HPI: Patient is a 87 y.o. female seen today for medical management of chronic diseases.     LLE, slow healing wound, applying Nystatin/Hydrocortisone presently.              12/23/20 R ankle fracture ( Fracture of the medial malleolus. Nondisplaced fracture of the distal fibula. S/p ORIF, in brace, WBAT, ambulates with walker.  Anemia, Hgb 9.3 05/12/21, on Pantoprazole for GI protection, FOBT negative x3, normal Fe, Vit B12, Folate. On Fe Edema BLE, chronic, on Furosemide, 12/19/20 venous US LLE negative DVT CKD Bun/creat 33/1.70 05/12/21, pending nephrology referral.              Her urinary frequency/leakage, uses pads, it seems better since taking Myrbetriq in pm per Urology             HTN, blood pressure is controlled, on Valsartan             No constipation, taking Fibercon prn.               Osteoporosis, off Alendronate due to CKD, 11/05/20 DEXA t score -2.8  Past Medical History:  Diagnosis Date   Anemia    Chronic kidney disease    stage 3   GERD  (gastroesophageal reflux disease)    Hypertension     Past Surgical History:  Procedure Laterality Date   ORIF ANKLE FRACTURE Right 01/13/2021   Procedure: OPEN TREATMENT OF RIGHT TRIMALLEOLAR ANKLE FRACTURE WITHOUT POSTERIOR FIXATION, POSSIBLE SYNDESMOSIS;  Surgeon: Erle Crocker, MD;  Location: [facility];  Service: Orthopedics;  Laterality: Right;    Allergies  Allergen Reactions   Ace Inhibitors    Flagyl [Metronidazole]    Penicillins Swelling    facial    Allergies as of 05/14/2021       Reactions   Ace Inhibitors    Flagyl [metronidazole]    Penicillins Swelling   facial        Medication List        Accurate as of May 14, 2021 11:59 PM. If you have any questions, ask your nurse or doctor.          acetaminophen 325 MG tablet Commonly known as: TYLENOL Take 2 tablets (650 mg total) by mouth every 6 (six) hours as needed.   Calcium Carb-Cholecalciferol 500-400 MG-UNIT Tabs Take 1 tablet by mouth daily.   cholecalciferol 25 MCG (1000 UNIT) tablet Commonly known as: VITAMIN D Take 2 tablets (2,000 Units total) by mouth daily.   ferrous sulfate 325 (  65 FE) MG tablet Take 1 tablet (325 mg total) by mouth daily with breakfast. MON, WED, FRI   furosemide 20 MG tablet Commonly known as: LASIX Take 1 tablet (20 mg total) by mouth daily.   furosemide 40 MG tablet Commonly known as: LASIX Take 1 tablet (40 mg total) by mouth daily.   hydrocortisone 2.5%-nystatin-zinc oxide 20% 1:1:1 ointment mixture Apply topically daily.   ICAPS AREDS 2 PO Take by mouth 2 (two) times daily.   mirabegron ER 25 MG Tb24 tablet Commonly known as: MYRBETRIQ Take 2 tablets (50 mg total) by mouth daily.   pantoprazole 40 MG tablet Commonly known as: PROTONIX Take 1 tablet (40 mg total) by mouth daily.   polycarbophil 625 MG tablet Commonly known as: FIBERCON Take 1 tablet (625 mg total) by mouth as needed.   potassium chloride SA 20 MEQ tablet Commonly known  as: KLOR-CON M Take 1 tablet (20 mEq total) by mouth daily.   potassium chloride SA 20 MEQ tablet Commonly known as: KLOR-CON M Take 2 tablets (40 mEq total) by mouth 2 (two) times daily.   valsartan 160 MG tablet Commonly known as: DIOVAN Take 1 tablet (160 mg total) by mouth daily.   vitamin C 250 MG tablet Commonly known as: ASCORBIC ACID Take 1 tablet (250 mg total) by mouth daily.        Review of Systems:  Review of Systems  Constitutional:  Negative for fatigue, fever and unexpected weight change.  HENT:  Positive for hearing loss. Negative for congestion and voice change.   Eyes:  Negative for visual disturbance.  Respiratory:  Negative for cough and shortness of breath.   Cardiovascular:  Positive for leg swelling.  Gastrointestinal:  Negative for abdominal pain and constipation.  Genitourinary:  Positive for frequency. Negative for dysuria and urgency.       Occasionally incontinent of urine. Urination average every 4 hrs during day 1x/night.   Musculoskeletal:  Positive for arthralgias and gait problem.       Right lower leg/ankle brace  Skin:  Negative for color change.       Left calf skin tear is near healed.   Neurological:  Negative for speech difficulty, weakness and light-headedness.       Memory lapses.   Psychiatric/Behavioral:  Negative for behavioral problems and sleep disturbance. The patient is not nervous/anxious.    Health Maintenance  Topic Date Due   Zoster Vaccines- Shingrix (1 of 2) Never done   COVID-19 Vaccine (3 - Moderna risk series) 11/25/2020   TETANUS/TDAP  11/26/2029   Pneumonia Vaccine 23+ Years old  Completed   INFLUENZA VACCINE  Completed   DEXA SCAN  Completed   HPV VACCINES  Aged Out    Physical Exam: Vitals:   05/14/21 1343  BP: 116/72  Pulse: 84  Temp: (!) 97.1 F (36.2 C)  SpO2: 99%  Weight: 120 lb (54.4 kg)  Height: 5' 4.5" (1.638 m)   Body mass index is 20.28 kg/m. Physical Exam Vitals reviewed.   Constitutional:      Appearance: Normal appearance.  HENT:     Head: Normocephalic and atraumatic.     Nose: Nose normal.     Mouth/Throat:     Mouth: Mucous membranes are moist.  Eyes:     Extraocular Movements: Extraocular movements intact.     Conjunctiva/sclera: Conjunctivae normal.     Pupils: Pupils are equal, round, and reactive to light.  Cardiovascular:     Rate and Rhythm:  Normal rate and regular rhythm.     Heart sounds: No murmur heard. Pulmonary:     Breath sounds: No rales.  Abdominal:     General: Bowel sounds are normal.     Palpations: Abdomen is soft.     Tenderness: There is no abdominal tenderness.  Musculoskeletal:        General: No tenderness.     Cervical back: Normal range of motion and neck supple.     Right lower leg: Edema present.     Left lower leg: Edema present.     Comments: Right ankle in brace, s/p ORIF 01/13/21. Edema BLE 1+  Skin:    General: Skin is warm and dry.     Findings: No bruising.     Comments: Chronic pigmented venous insufficiency skin changes BLE. Left calf skin tear is healed, residual mild redness. Lateral right lower leg skin lesion about dime size-will see Dermatology. Dry skin from sponge bath-recommend to a soak bath.   Neurological:     Mental Status: She is alert. Mental status is at baseline.     Gait: Gait abnormal.     Comments: Oriented to person, place.   Psychiatric:        Mood and Affect: Mood normal.        Behavior: Behavior normal.        Thought Content: Thought content normal.    Labs reviewed: Basic Metabolic Panel: Recent Labs    11/20/20 0730 12/08/20 0828 12/20/20 0000 01/13/21 0741 03/03/21 0000 05/12/21 0715  NA 138 136   < > 135 138 138  K 4.2 4.6   < > 3.9 4.4 4.3  CL 102 102   < > 100 104 104  CO2 26 22   < > 26 28* 26  GLUCOSE 78 79  --  85  --  86  BUN 44* 42*   < > 23 31* 33*  CREATININE 1.89* 1.92*   < > 1.57* 1.6* 1.70*  CALCIUM 10.4 10.3   < > 9.4 9.5 9.5  TSH 2.48  --   --    --   --   --    < > = values in this interval not displayed.   Liver Function Tests: Recent Labs    12/08/20 0828 12/20/20 0000 12/23/20 0000 01/13/21 0741 05/12/21 0715  AST 21 20 26 22 19   ALT 12 15 13 17 12   ALKPHOS  --  52 57 63  --   BILITOT 0.4  --   --  0.7 0.4  PROT 6.7  --   --  6.0* 6.5  ALBUMIN  --  4.2 4.2 3.3*  --    No results for input(s): LIPASE, AMYLASE in the last 8760 hours. No results for input(s): AMMONIA in the last 8760 hours. CBC: Recent Labs    12/08/20 0828 12/20/20 0000 01/13/21 0741 01/22/21 0000 02/05/21 0000 02/12/21 0000 05/12/21 0715  WBC 6.5   < > 5.9   < > 5.7 5.7 5.9  NEUTROABS 3,244   < >  --    < > 3,386.00 3,392.00 3,292  HGB 8.6*   < > 8.0*   < > 7.7* 8.1* 9.3*  HCT 26.6*   < > 25.5*   < > 24* 25* 28.2*  MCV 98.5  --  100.8*  --   --   --  95.6  PLT 180   < > 241   < > 212 217 246   < > =  values in this interval not displayed.   Lipid Panel: No results for input(s): CHOL, HDL, LDLCALC, TRIG, CHOLHDL, LDLDIRECT in the last 8760 hours. No results found for: HGBA1C  Procedures since last visit: No results found.  Assessment/Plan  Anemia Hgb 9.3 05/12/21, on Pantoprazole for GI protection, FOBT negative x3, normal Fe, Vit B12, Folate. On Fe  Venous insufficiency (chronic) (peripheral)  chronic, on Furosemide, 12/19/20 venous US LLE negative DVT  CKD (chronic kidney disease) stage 3, GFR 30-59 ml/min (HCC) Bun/creat 33/1.70 05/12/21, pending nephrology referral.   Incontinent of urine urinary frequency/leakage, uses pads, it seems better since taking Myrbetriq in pm per Urology  Open wound of left lower leg healing  Hypertension blood pressure is controlled, on Valsartan  Slow transit constipation taking Fibercon prn.   Osteoporosis  off Alendronate due to CKD, 11/05/20 DEXA t score -2.8   Labs/tests ordered:  none  Next appt: 4 weeks.

## 2021-05-14 NOTE — Assessment & Plan Note (Signed)
urinary frequency/leakage, uses pads, it seems better since taking Myrbetriq in pm per Urology

## 2021-05-14 NOTE — Assessment & Plan Note (Signed)
taking Fibercon prn.

## 2021-05-14 NOTE — Assessment & Plan Note (Signed)
chronic, on Furosemide, 12/19/20 venous US LLE negative DVT

## 2021-05-14 NOTE — Assessment & Plan Note (Signed)
Hgb 9.3 05/12/21, on Pantoprazole for GI protection, FOBT negative x3, normal Fe, Vit B12, Folate. On Fe

## 2021-05-14 NOTE — Assessment & Plan Note (Signed)
blood pressure is controlled, on Valsartan

## 2021-05-14 NOTE — Assessment & Plan Note (Signed)
Bun/creat 33/1.70 05/12/21, pending nephrology referral.

## 2021-05-15 ENCOUNTER — Encounter: Payer: Self-pay | Admitting: Nurse Practitioner

## 2021-05-15 DIAGNOSIS — R41841 Cognitive communication deficit: Secondary | ICD-10-CM | POA: Diagnosis not present

## 2021-05-15 DIAGNOSIS — S82891D Other fracture of right lower leg, subsequent encounter for closed fracture with routine healing: Secondary | ICD-10-CM | POA: Diagnosis not present

## 2021-05-15 DIAGNOSIS — R2681 Unsteadiness on feet: Secondary | ICD-10-CM | POA: Diagnosis not present

## 2021-05-15 DIAGNOSIS — Z9181 History of falling: Secondary | ICD-10-CM | POA: Diagnosis not present

## 2021-05-15 DIAGNOSIS — R29898 Other symptoms and signs involving the musculoskeletal system: Secondary | ICD-10-CM | POA: Diagnosis not present

## 2021-05-15 DIAGNOSIS — S8254XD Nondisplaced fracture of medial malleolus of right tibia, subsequent encounter for closed fracture with routine healing: Secondary | ICD-10-CM | POA: Diagnosis not present

## 2021-05-18 DIAGNOSIS — M25571 Pain in right ankle and joints of right foot: Secondary | ICD-10-CM | POA: Diagnosis not present

## 2021-05-19 DIAGNOSIS — Z9181 History of falling: Secondary | ICD-10-CM | POA: Diagnosis not present

## 2021-05-19 DIAGNOSIS — R29898 Other symptoms and signs involving the musculoskeletal system: Secondary | ICD-10-CM | POA: Diagnosis not present

## 2021-05-19 DIAGNOSIS — S82891D Other fracture of right lower leg, subsequent encounter for closed fracture with routine healing: Secondary | ICD-10-CM | POA: Diagnosis not present

## 2021-05-19 DIAGNOSIS — R41841 Cognitive communication deficit: Secondary | ICD-10-CM | POA: Diagnosis not present

## 2021-05-19 DIAGNOSIS — S8254XD Nondisplaced fracture of medial malleolus of right tibia, subsequent encounter for closed fracture with routine healing: Secondary | ICD-10-CM | POA: Diagnosis not present

## 2021-05-19 DIAGNOSIS — R2681 Unsteadiness on feet: Secondary | ICD-10-CM | POA: Diagnosis not present

## 2021-05-20 DIAGNOSIS — Z9181 History of falling: Secondary | ICD-10-CM | POA: Diagnosis not present

## 2021-05-20 DIAGNOSIS — R29898 Other symptoms and signs involving the musculoskeletal system: Secondary | ICD-10-CM | POA: Diagnosis not present

## 2021-05-20 DIAGNOSIS — S8254XD Nondisplaced fracture of medial malleolus of right tibia, subsequent encounter for closed fracture with routine healing: Secondary | ICD-10-CM | POA: Diagnosis not present

## 2021-05-20 DIAGNOSIS — S82891D Other fracture of right lower leg, subsequent encounter for closed fracture with routine healing: Secondary | ICD-10-CM | POA: Diagnosis not present

## 2021-05-20 DIAGNOSIS — R2681 Unsteadiness on feet: Secondary | ICD-10-CM | POA: Diagnosis not present

## 2021-05-20 DIAGNOSIS — R41841 Cognitive communication deficit: Secondary | ICD-10-CM | POA: Diagnosis not present

## 2021-05-21 DIAGNOSIS — S82891D Other fracture of right lower leg, subsequent encounter for closed fracture with routine healing: Secondary | ICD-10-CM | POA: Diagnosis not present

## 2021-05-21 DIAGNOSIS — R2681 Unsteadiness on feet: Secondary | ICD-10-CM | POA: Diagnosis not present

## 2021-05-21 DIAGNOSIS — Z9181 History of falling: Secondary | ICD-10-CM | POA: Diagnosis not present

## 2021-05-21 DIAGNOSIS — R41841 Cognitive communication deficit: Secondary | ICD-10-CM | POA: Diagnosis not present

## 2021-05-21 DIAGNOSIS — S8254XD Nondisplaced fracture of medial malleolus of right tibia, subsequent encounter for closed fracture with routine healing: Secondary | ICD-10-CM | POA: Diagnosis not present

## 2021-05-21 DIAGNOSIS — R29898 Other symptoms and signs involving the musculoskeletal system: Secondary | ICD-10-CM | POA: Diagnosis not present

## 2021-05-22 DIAGNOSIS — S8254XD Nondisplaced fracture of medial malleolus of right tibia, subsequent encounter for closed fracture with routine healing: Secondary | ICD-10-CM | POA: Diagnosis not present

## 2021-05-22 DIAGNOSIS — R2681 Unsteadiness on feet: Secondary | ICD-10-CM | POA: Diagnosis not present

## 2021-05-22 DIAGNOSIS — Z9181 History of falling: Secondary | ICD-10-CM | POA: Diagnosis not present

## 2021-05-22 DIAGNOSIS — R41841 Cognitive communication deficit: Secondary | ICD-10-CM | POA: Diagnosis not present

## 2021-05-22 DIAGNOSIS — S82891D Other fracture of right lower leg, subsequent encounter for closed fracture with routine healing: Secondary | ICD-10-CM | POA: Diagnosis not present

## 2021-05-22 DIAGNOSIS — R29898 Other symptoms and signs involving the musculoskeletal system: Secondary | ICD-10-CM | POA: Diagnosis not present

## 2021-05-26 DIAGNOSIS — I872 Venous insufficiency (chronic) (peripheral): Secondary | ICD-10-CM | POA: Diagnosis not present

## 2021-05-26 DIAGNOSIS — L814 Other melanin hyperpigmentation: Secondary | ICD-10-CM | POA: Diagnosis not present

## 2021-05-26 DIAGNOSIS — Z85068 Personal history of other malignant neoplasm of small intestine: Secondary | ICD-10-CM | POA: Diagnosis not present

## 2021-05-26 DIAGNOSIS — R2681 Unsteadiness on feet: Secondary | ICD-10-CM | POA: Diagnosis not present

## 2021-05-26 DIAGNOSIS — Z9181 History of falling: Secondary | ICD-10-CM | POA: Diagnosis not present

## 2021-05-26 DIAGNOSIS — S82891D Other fracture of right lower leg, subsequent encounter for closed fracture with routine healing: Secondary | ICD-10-CM | POA: Diagnosis not present

## 2021-05-26 DIAGNOSIS — S8254XD Nondisplaced fracture of medial malleolus of right tibia, subsequent encounter for closed fracture with routine healing: Secondary | ICD-10-CM | POA: Diagnosis not present

## 2021-05-26 DIAGNOSIS — R29898 Other symptoms and signs involving the musculoskeletal system: Secondary | ICD-10-CM | POA: Diagnosis not present

## 2021-05-26 DIAGNOSIS — L858 Other specified epidermal thickening: Secondary | ICD-10-CM | POA: Diagnosis not present

## 2021-05-26 DIAGNOSIS — D692 Other nonthrombocytopenic purpura: Secondary | ICD-10-CM | POA: Diagnosis not present

## 2021-05-26 DIAGNOSIS — R41841 Cognitive communication deficit: Secondary | ICD-10-CM | POA: Diagnosis not present

## 2021-05-26 DIAGNOSIS — L821 Other seborrheic keratosis: Secondary | ICD-10-CM | POA: Diagnosis not present

## 2021-05-26 DIAGNOSIS — L570 Actinic keratosis: Secondary | ICD-10-CM | POA: Diagnosis not present

## 2021-05-27 DIAGNOSIS — R29898 Other symptoms and signs involving the musculoskeletal system: Secondary | ICD-10-CM | POA: Diagnosis not present

## 2021-05-27 DIAGNOSIS — S82891D Other fracture of right lower leg, subsequent encounter for closed fracture with routine healing: Secondary | ICD-10-CM | POA: Diagnosis not present

## 2021-05-27 DIAGNOSIS — R2681 Unsteadiness on feet: Secondary | ICD-10-CM | POA: Diagnosis not present

## 2021-05-27 DIAGNOSIS — Z9181 History of falling: Secondary | ICD-10-CM | POA: Diagnosis not present

## 2021-05-27 DIAGNOSIS — R41841 Cognitive communication deficit: Secondary | ICD-10-CM | POA: Diagnosis not present

## 2021-05-27 DIAGNOSIS — S8254XD Nondisplaced fracture of medial malleolus of right tibia, subsequent encounter for closed fracture with routine healing: Secondary | ICD-10-CM | POA: Diagnosis not present

## 2021-05-29 DIAGNOSIS — S8254XD Nondisplaced fracture of medial malleolus of right tibia, subsequent encounter for closed fracture with routine healing: Secondary | ICD-10-CM | POA: Diagnosis not present

## 2021-05-29 DIAGNOSIS — Z9181 History of falling: Secondary | ICD-10-CM | POA: Diagnosis not present

## 2021-05-29 DIAGNOSIS — S82891D Other fracture of right lower leg, subsequent encounter for closed fracture with routine healing: Secondary | ICD-10-CM | POA: Diagnosis not present

## 2021-05-29 DIAGNOSIS — R41841 Cognitive communication deficit: Secondary | ICD-10-CM | POA: Diagnosis not present

## 2021-05-29 DIAGNOSIS — R29898 Other symptoms and signs involving the musculoskeletal system: Secondary | ICD-10-CM | POA: Diagnosis not present

## 2021-05-29 DIAGNOSIS — R2681 Unsteadiness on feet: Secondary | ICD-10-CM | POA: Diagnosis not present

## 2021-06-02 DIAGNOSIS — R2681 Unsteadiness on feet: Secondary | ICD-10-CM | POA: Diagnosis not present

## 2021-06-02 DIAGNOSIS — R41841 Cognitive communication deficit: Secondary | ICD-10-CM | POA: Diagnosis not present

## 2021-06-02 DIAGNOSIS — S8254XD Nondisplaced fracture of medial malleolus of right tibia, subsequent encounter for closed fracture with routine healing: Secondary | ICD-10-CM | POA: Diagnosis not present

## 2021-06-02 DIAGNOSIS — Z9181 History of falling: Secondary | ICD-10-CM | POA: Diagnosis not present

## 2021-06-02 DIAGNOSIS — R29898 Other symptoms and signs involving the musculoskeletal system: Secondary | ICD-10-CM | POA: Diagnosis not present

## 2021-06-02 DIAGNOSIS — S82891D Other fracture of right lower leg, subsequent encounter for closed fracture with routine healing: Secondary | ICD-10-CM | POA: Diagnosis not present

## 2021-06-03 DIAGNOSIS — S82891D Other fracture of right lower leg, subsequent encounter for closed fracture with routine healing: Secondary | ICD-10-CM | POA: Diagnosis not present

## 2021-06-03 DIAGNOSIS — R29898 Other symptoms and signs involving the musculoskeletal system: Secondary | ICD-10-CM | POA: Diagnosis not present

## 2021-06-03 DIAGNOSIS — S8254XD Nondisplaced fracture of medial malleolus of right tibia, subsequent encounter for closed fracture with routine healing: Secondary | ICD-10-CM | POA: Diagnosis not present

## 2021-06-03 DIAGNOSIS — Z9181 History of falling: Secondary | ICD-10-CM | POA: Diagnosis not present

## 2021-06-03 DIAGNOSIS — R41841 Cognitive communication deficit: Secondary | ICD-10-CM | POA: Diagnosis not present

## 2021-06-03 DIAGNOSIS — R2681 Unsteadiness on feet: Secondary | ICD-10-CM | POA: Diagnosis not present

## 2021-06-05 DIAGNOSIS — S8254XD Nondisplaced fracture of medial malleolus of right tibia, subsequent encounter for closed fracture with routine healing: Secondary | ICD-10-CM | POA: Diagnosis not present

## 2021-06-05 DIAGNOSIS — S82891D Other fracture of right lower leg, subsequent encounter for closed fracture with routine healing: Secondary | ICD-10-CM | POA: Diagnosis not present

## 2021-06-05 DIAGNOSIS — R29898 Other symptoms and signs involving the musculoskeletal system: Secondary | ICD-10-CM | POA: Diagnosis not present

## 2021-06-05 DIAGNOSIS — R41841 Cognitive communication deficit: Secondary | ICD-10-CM | POA: Diagnosis not present

## 2021-06-05 DIAGNOSIS — R2681 Unsteadiness on feet: Secondary | ICD-10-CM | POA: Diagnosis not present

## 2021-06-05 DIAGNOSIS — Z9181 History of falling: Secondary | ICD-10-CM | POA: Diagnosis not present

## 2021-06-09 DIAGNOSIS — R41841 Cognitive communication deficit: Secondary | ICD-10-CM | POA: Diagnosis not present

## 2021-06-09 DIAGNOSIS — S82891D Other fracture of right lower leg, subsequent encounter for closed fracture with routine healing: Secondary | ICD-10-CM | POA: Diagnosis not present

## 2021-06-09 DIAGNOSIS — R29898 Other symptoms and signs involving the musculoskeletal system: Secondary | ICD-10-CM | POA: Diagnosis not present

## 2021-06-09 DIAGNOSIS — S8254XD Nondisplaced fracture of medial malleolus of right tibia, subsequent encounter for closed fracture with routine healing: Secondary | ICD-10-CM | POA: Diagnosis not present

## 2021-06-09 DIAGNOSIS — Z9181 History of falling: Secondary | ICD-10-CM | POA: Diagnosis not present

## 2021-06-09 DIAGNOSIS — R2681 Unsteadiness on feet: Secondary | ICD-10-CM | POA: Diagnosis not present

## 2021-06-10 DIAGNOSIS — S8254XD Nondisplaced fracture of medial malleolus of right tibia, subsequent encounter for closed fracture with routine healing: Secondary | ICD-10-CM | POA: Diagnosis not present

## 2021-06-10 DIAGNOSIS — S82891D Other fracture of right lower leg, subsequent encounter for closed fracture with routine healing: Secondary | ICD-10-CM | POA: Diagnosis not present

## 2021-06-10 DIAGNOSIS — R41841 Cognitive communication deficit: Secondary | ICD-10-CM | POA: Diagnosis not present

## 2021-06-10 DIAGNOSIS — R2681 Unsteadiness on feet: Secondary | ICD-10-CM | POA: Diagnosis not present

## 2021-06-10 DIAGNOSIS — Z9181 History of falling: Secondary | ICD-10-CM | POA: Diagnosis not present

## 2021-06-10 DIAGNOSIS — R29898 Other symptoms and signs involving the musculoskeletal system: Secondary | ICD-10-CM | POA: Diagnosis not present

## 2021-06-12 DIAGNOSIS — R2681 Unsteadiness on feet: Secondary | ICD-10-CM | POA: Diagnosis not present

## 2021-06-12 DIAGNOSIS — S82891D Other fracture of right lower leg, subsequent encounter for closed fracture with routine healing: Secondary | ICD-10-CM | POA: Diagnosis not present

## 2021-06-12 DIAGNOSIS — Z9181 History of falling: Secondary | ICD-10-CM | POA: Diagnosis not present

## 2021-06-12 DIAGNOSIS — R41841 Cognitive communication deficit: Secondary | ICD-10-CM | POA: Diagnosis not present

## 2021-06-12 DIAGNOSIS — R29898 Other symptoms and signs involving the musculoskeletal system: Secondary | ICD-10-CM | POA: Diagnosis not present

## 2021-06-12 DIAGNOSIS — S8254XD Nondisplaced fracture of medial malleolus of right tibia, subsequent encounter for closed fracture with routine healing: Secondary | ICD-10-CM | POA: Diagnosis not present

## 2021-06-18 ENCOUNTER — Non-Acute Institutional Stay: Payer: Medicare Other | Admitting: Nurse Practitioner

## 2021-06-18 ENCOUNTER — Other Ambulatory Visit: Payer: Self-pay

## 2021-06-18 ENCOUNTER — Encounter: Payer: Self-pay | Admitting: Nurse Practitioner

## 2021-06-18 DIAGNOSIS — I10 Essential (primary) hypertension: Secondary | ICD-10-CM | POA: Diagnosis not present

## 2021-06-18 DIAGNOSIS — R159 Full incontinence of feces: Secondary | ICD-10-CM

## 2021-06-18 DIAGNOSIS — L853 Xerosis cutis: Secondary | ICD-10-CM | POA: Diagnosis not present

## 2021-06-18 DIAGNOSIS — N1831 Chronic kidney disease, stage 3a: Secondary | ICD-10-CM | POA: Diagnosis not present

## 2021-06-18 DIAGNOSIS — N3942 Incontinence without sensory awareness: Secondary | ICD-10-CM | POA: Diagnosis not present

## 2021-06-18 DIAGNOSIS — S81802D Unspecified open wound, left lower leg, subsequent encounter: Secondary | ICD-10-CM

## 2021-06-18 DIAGNOSIS — S82891D Other fracture of right lower leg, subsequent encounter for closed fracture with routine healing: Secondary | ICD-10-CM

## 2021-06-18 DIAGNOSIS — I872 Venous insufficiency (chronic) (peripheral): Secondary | ICD-10-CM | POA: Diagnosis not present

## 2021-06-18 DIAGNOSIS — K5901 Slow transit constipation: Secondary | ICD-10-CM

## 2021-06-18 DIAGNOSIS — M8000XD Age-related osteoporosis with current pathological fracture, unspecified site, subsequent encounter for fracture with routine healing: Secondary | ICD-10-CM | POA: Diagnosis not present

## 2021-06-18 DIAGNOSIS — D649 Anemia, unspecified: Secondary | ICD-10-CM | POA: Diagnosis not present

## 2021-06-18 NOTE — Progress Notes (Signed)
Location:   clinic [facility]   Place of Service:  Clinic (12) Provider: Marlana Latus NP  Code Status: DNR Goals of Care: [place] 06/18/2021  Does Patient Have a Medical Advance Directive? Yes  Type of Paramedic of [place];Living will;Out of facility DNR (pink MOST or yellow form)  Does patient want to make changes to medical advance directive? No - Patient declined  Copy of [place] in Chart? Yes - validated most recent copy scanned in chart (See row information)  Pre-existing out of facility DNR order (yellow form or pink MOST form) Yellow form placed in chart (order not valid for inpatient use);Pink MOST form placed in chart (order not valid for inpatient use)     Chief Complaint  Patient presents with   Follow-up    4 week follow-up. Discuss need for shingrix and covd boosters or postpone if patient refuses. Patient [place] unsure of her currently daily medications and state " I take 7-8 pills daily, not 100 % sure of dose and instructions for some of them." Patient would like to discuss current dose of Fibercon.     HPI: Patient is a 88 y.o. female seen today for medical management of chronic diseases.    LLE, slow healing wound, applying Nystatin/Hydrocortisone presently.              12/23/20 R ankle fracture ( Fracture of the medial malleolus. Nondisplaced fracture of the distal fibula. S/p ORIF, in brace, WBAT, ambulates with walker.  Anemia, Hgb 9.3 05/12/21, on Pantoprazole for GI protection, FOBT negative x3, normal Fe, Vit B12, Folate. On Fe Edema BLE, chronic, on Furosemide, 12/19/20 venous US LLE negative DVT CKD Bun/creat 33/1.70 05/12/21, pending nephrology referral.              Her urinary frequency/leakage, uses pads, it seems better since taking Myrbetriq in pm per Urology             HTN, blood pressure is controlled, on Valsartan             No constipation, taking Fibercon prn.               Osteoporosis, off  Alendronate due to CKD, 11/05/20 DEXA t score -2.8  Fecal incontinence prn Imodium  Itching dry skin, prn Hydrocortisone cream, skin moisturizer.   Past Medical History:  Diagnosis Date   Anemia    Chronic kidney disease    stage 3   GERD (gastroesophageal reflux disease)    Hypertension     Past Surgical History:  Procedure Laterality Date   ORIF ANKLE FRACTURE Right 01/13/2021   Procedure: OPEN TREATMENT OF RIGHT TRIMALLEOLAR ANKLE FRACTURE WITHOUT POSTERIOR FIXATION, POSSIBLE SYNDESMOSIS;  Surgeon: Erle Crocker, MD;  Location: [facility];  Service: Orthopedics;  Laterality: Right;    Allergies  Allergen Reactions   Ace Inhibitors    Flagyl [Metronidazole]    Penicillins Swelling    facial    Allergies as of 06/18/2021       Reactions   Ace Inhibitors    Flagyl [metronidazole]    Penicillins Swelling   facial        Medication List        Accurate as of June 18, 2021 11:59 PM. If you have any questions, ask your nurse or doctor.          acetaminophen 325 MG tablet Commonly known as: TYLENOL Take 2 tablets (650 mg total) by mouth every  6 (six) hours as needed.   Calcium Carb-Cholecalciferol 500-400 MG-UNIT Tabs Take 1 tablet by mouth daily.   cholecalciferol 25 MCG (1000 UNIT) tablet Commonly known as: VITAMIN D Take 2 tablets (2,000 Units total) by mouth daily.   ferrous sulfate 325 (65 FE) MG tablet Take 1 tablet (325 mg total) by mouth daily with breakfast. MON, WED, FRI   furosemide 20 MG tablet Commonly known as: LASIX Take 1 tablet (20 mg total) by mouth daily.   furosemide 40 MG tablet Commonly known as: LASIX Take 1 tablet (40 mg total) by mouth daily.   hydrocortisone 2.5%-nystatin-zinc oxide 20% 1:1:1 ointment mixture Apply topically daily.   ICAPS AREDS 2 PO Take by mouth 2 (two) times daily.   mirabegron ER 25 MG Tb24 tablet Commonly known as: MYRBETRIQ Take 2 tablets (50 mg total) by mouth daily.   pantoprazole 40 MG  tablet Commonly known as: PROTONIX Take 1 tablet (40 mg total) by mouth daily.   polycarbophil 625 MG tablet Commonly known as: FIBERCON Take 1 tablet (625 mg total) by mouth as needed.   potassium chloride SA 20 MEQ tablet Commonly known as: KLOR-CON M Take 1 tablet (20 mEq total) by mouth daily.   potassium chloride SA 20 MEQ tablet Commonly known as: KLOR-CON M Take 2 tablets (40 mEq total) by mouth 2 (two) times daily.   valsartan 160 MG tablet Commonly known as: DIOVAN Take 1 tablet (160 mg total) by mouth daily.   vitamin C 250 MG tablet Commonly known as: ASCORBIC ACID Take 1 tablet (250 mg total) by mouth daily.        Review of Systems:  Review of Systems  Constitutional:  Negative for fatigue, fever and unexpected weight change.  HENT:  Positive for hearing loss. Negative for congestion and voice change.   Eyes:  Negative for visual disturbance.  Respiratory:  Negative for shortness of breath.   Cardiovascular:  Positive for leg swelling.  Gastrointestinal:  Negative for abdominal pain and constipation.       Fecal incontinent.   Genitourinary:  Positive for frequency. Negative for dysuria and urgency.       Occasionally incontinent of urine. Urination average every 4 hrs during day 1x/night.   Musculoskeletal:  Positive for arthralgias and gait problem.       Right lower leg/ankle brace  Skin:  Negative for color change.       Left calf skin tear is near healed.   Neurological:  Negative for speech difficulty, weakness and light-headedness.       Memory lapses.   Psychiatric/Behavioral:  Negative for behavioral problems and sleep disturbance. The patient is not nervous/anxious.    Health Maintenance  Topic Date Due   COVID-19 Vaccine (3 - Moderna risk series) 11/25/2020   Zoster Vaccines- Shingrix (1 of 2) 06/14/2022 (Originally 05/04/1948)   TETANUS/TDAP  11/26/2029   Pneumonia Vaccine 79+ Years old  Completed   INFLUENZA VACCINE  Completed   DEXA  SCAN  Completed   HPV VACCINES  Aged Out    Physical Exam: Vitals:   06/18/21 1109  BP: 132/78  Pulse: 94  Temp: 97.8 F (36.6 C)  TempSrc: Temporal  SpO2: 96%  Weight: 118 lb (53.5 kg)  Height: 5' 4.5" (1.638 m)   Body mass index is 19.94 kg/m. Physical Exam Vitals reviewed.  Constitutional:      Appearance: Normal appearance.  HENT:     Head: Normocephalic and atraumatic.     Nose: Nose  normal.     Mouth/Throat:     Mouth: Mucous membranes are moist.  Eyes:     Extraocular Movements: Extraocular movements intact.     Conjunctiva/sclera: Conjunctivae normal.     Pupils: Pupils are equal, round, and reactive to light.  Cardiovascular:     Rate and Rhythm: Normal rate and regular rhythm.     Heart sounds: No murmur heard. Pulmonary:     Breath sounds: No rales.  Abdominal:     General: Bowel sounds are normal.     Palpations: Abdomen is soft.     Tenderness: There is no abdominal tenderness.  Musculoskeletal:        General: No tenderness.     Cervical back: Normal range of motion and neck supple.     Right lower leg: Edema present.     Left lower leg: Edema present.     Comments: Right ankle in brace, s/p ORIF 01/13/21. Edema BLE 1+  Skin:    General: Skin is warm and dry.     Comments: Chronic pigmented venous insufficiency skin changes BLE. Left calf skin tear is healed, residual mild redness. Dry skin from sponge bath-recommend to a soak bath.   Neurological:     Mental Status: She is alert. Mental status is at baseline.     Gait: Gait abnormal.     Comments: Oriented to person, place.   Psychiatric:        Mood and Affect: Mood normal.        Behavior: Behavior normal.        Thought Content: Thought content normal.    Labs reviewed: Basic Metabolic Panel: Recent Labs    11/20/20 0730 12/08/20 0828 12/20/20 0000 01/13/21 0741 03/03/21 0000 05/12/21 0715  NA 138 136   < > 135 138 138  K 4.2 4.6   < > 3.9 4.4 4.3  CL 102 102   < > 100 104 104   CO2 26 22   < > 26 28* 26  GLUCOSE 78 79  --  85  --  86  BUN 44* 42*   < > 23 31* 33*  CREATININE 1.89* 1.92*   < > 1.57* 1.6* 1.70*  CALCIUM 10.4 10.3   < > 9.4 9.5 9.5  TSH 2.48  --   --   --   --   --    < > = values in this interval not displayed.   Liver Function Tests: Recent Labs    12/08/20 0828 12/20/20 0000 12/23/20 0000 01/13/21 0741 05/12/21 0715  AST 21 20 26 22 19   ALT 12 15 13 17 12   ALKPHOS  --  52 57 63  --   BILITOT 0.4  --   --  0.7 0.4  PROT 6.7  --   --  6.0* 6.5  ALBUMIN  --  4.2 4.2 3.3*  --    No results for input(s): LIPASE, AMYLASE in the last 8760 hours. No results for input(s): AMMONIA in the last 8760 hours. CBC: Recent Labs    12/08/20 0828 12/20/20 0000 01/13/21 0741 01/22/21 0000 02/05/21 0000 02/12/21 0000 05/12/21 0715  WBC 6.5   < > 5.9   < > 5.7 5.7 5.9  NEUTROABS 3,244   < >  --    < > 3,386.00 3,392.00 3,292  HGB 8.6*   < > 8.0*   < > 7.7* 8.1* 9.3*  HCT 26.6*   < > 25.5*   < >  24* 25* 28.2*  MCV 98.5  --  100.8*  --   --   --  95.6  PLT 180   < > 241   < > 212 217 246   < > = values in this interval not displayed.   Lipid Panel: No results for input(s): CHOL, HDL, LDLCALC, TRIG, CHOLHDL, LDLDIRECT in the last 8760 hours. No results found for: HGBA1C  Procedures since last visit: No results found.  Assessment/Plan  Anemia Hgb 9.3 05/12/21, on Pantoprazole for GI protection, FOBT negative x3, normal Fe, Vit B12, Folate. On Fe  Venous insufficiency (chronic) (peripheral)  chronic, on Furosemide, 12/19/20 venous US LLE negative DVT  CKD (chronic kidney disease) stage 3, GFR 30-59 ml/min (HCC) Bun/creat 33/1.70, pending nephrology referral.   Incontinent of urine Her urinary frequency/leakage, uses pads, it seems better since taking Myrbetriq in pm per Urology. F/u Urology.   Hypertension blood pressure is controlled, on Valsartan  Slow transit constipation Stable, taking Fibercon prn.   Osteoporosis off  Alendronate due to CKD, 11/05/20 DEXA t score -2.8  Open wound of left lower leg LLE slow healing.   Closed right ankle fracture 12/23/20 R ankle fracture ( Fracture of the medial malleolus. Nondisplaced fracture of the distal fibula. S/p ORIF, in brace, WBAT, ambulates with walker.   Dry skin 1% Hydrocortisone lotion prn to the itching area prn.   Fecal incontinence Not new, Imodium worked, desires prn.    Labs/tests ordered: none  Next appt:  4 weeks.

## 2021-06-18 NOTE — Assessment & Plan Note (Signed)
Not new, Imodium worked, desires prn.

## 2021-06-18 NOTE — Assessment & Plan Note (Signed)
LLE slow healing.

## 2021-06-18 NOTE — Assessment & Plan Note (Signed)
off Alendronate due to CKD, 11/05/20 DEXA t score -2.8

## 2021-06-18 NOTE — Assessment & Plan Note (Addendum)
Her urinary frequency/leakage, uses pads, it seems better since taking Myrbetriq in pm per Urology. F/u Urology.

## 2021-06-18 NOTE — Assessment & Plan Note (Signed)
blood pressure is controlled, on Valsartan

## 2021-06-18 NOTE — Assessment & Plan Note (Signed)
Stable, taking Fibercon prn.

## 2021-06-18 NOTE — Assessment & Plan Note (Signed)
chronic, on Furosemide, 12/19/20 venous US LLE negative DVT

## 2021-06-18 NOTE — Assessment & Plan Note (Signed)
12/23/20 R ankle fracture ( Fracture of the medial malleolus. Nondisplaced fracture of the distal fibula. S/p ORIF, in brace, WBAT, ambulates with walker.

## 2021-06-18 NOTE — Assessment & Plan Note (Signed)
Hgb 9.3 05/12/21, on Pantoprazole for GI protection, FOBT negative x3, normal Fe, Vit B12, Folate. On Fe

## 2021-06-18 NOTE — Assessment & Plan Note (Signed)
1% Hydrocortisone lotion prn to the itching area prn.

## 2021-06-18 NOTE — Assessment & Plan Note (Addendum)
Bun/creat 33/1.70, pending nephrology referral.

## 2021-06-22 ENCOUNTER — Encounter: Payer: Self-pay | Admitting: Nurse Practitioner

## 2021-06-23 DIAGNOSIS — L570 Actinic keratosis: Secondary | ICD-10-CM | POA: Diagnosis not present

## 2021-06-23 DIAGNOSIS — L814 Other melanin hyperpigmentation: Secondary | ICD-10-CM | POA: Diagnosis not present

## 2021-07-07 DIAGNOSIS — R82998 Other abnormal findings in urine: Secondary | ICD-10-CM | POA: Diagnosis not present

## 2021-07-07 DIAGNOSIS — N1831 Chronic kidney disease, stage 3a: Secondary | ICD-10-CM | POA: Diagnosis not present

## 2021-07-07 DIAGNOSIS — D631 Anemia in chronic kidney disease: Secondary | ICD-10-CM | POA: Diagnosis not present

## 2021-07-07 DIAGNOSIS — N184 Chronic kidney disease, stage 4 (severe): Secondary | ICD-10-CM | POA: Diagnosis not present

## 2021-07-07 DIAGNOSIS — I129 Hypertensive chronic kidney disease with stage 1 through stage 4 chronic kidney disease, or unspecified chronic kidney disease: Secondary | ICD-10-CM | POA: Diagnosis not present

## 2021-07-07 DIAGNOSIS — N390 Urinary tract infection, site not specified: Secondary | ICD-10-CM | POA: Diagnosis not present

## 2021-07-07 DIAGNOSIS — R809 Proteinuria, unspecified: Secondary | ICD-10-CM | POA: Diagnosis not present

## 2021-07-09 ENCOUNTER — Other Ambulatory Visit: Payer: Self-pay | Admitting: Nephrology

## 2021-07-09 DIAGNOSIS — N184 Chronic kidney disease, stage 4 (severe): Secondary | ICD-10-CM

## 2021-07-20 ENCOUNTER — Encounter: Payer: Self-pay | Admitting: Orthopedic Surgery

## 2021-07-20 ENCOUNTER — Other Ambulatory Visit: Payer: Self-pay

## 2021-07-20 ENCOUNTER — Non-Acute Institutional Stay: Payer: Medicare Other | Admitting: Orthopedic Surgery

## 2021-07-20 VITALS — BP 122/84 | HR 81 | Temp 97.6°F | Resp 18

## 2021-07-20 DIAGNOSIS — L03116 Cellulitis of left lower limb: Secondary | ICD-10-CM | POA: Diagnosis not present

## 2021-07-20 DIAGNOSIS — M79662 Pain in left lower leg: Secondary | ICD-10-CM | POA: Diagnosis not present

## 2021-07-20 DIAGNOSIS — M7989 Other specified soft tissue disorders: Secondary | ICD-10-CM

## 2021-07-20 MED ORDER — DOXYCYCLINE HYCLATE 100 MG PO TABS: 100.0000 mg | ORAL_TABLET | Freq: Two times a day (BID) | ORAL | 0 refills | Status: DC

## 2021-07-20 NOTE — Progress Notes (Signed)
Careteam: Patient Care Team: Mast, Man X, NP as PCP - General (Internal Medicine)  Seen by: Windell Moulding, AGNP-C  PLACE OF SERVICE:  [place] Directive information    Allergies  Allergen Reactions   Ace Inhibitors    Flagyl [Metronidazole]    Penicillins Swelling    facial    No chief complaint on file.    HPI: Patient is a 88 y.o. female seen today for acute visit due to left leg swelling.   02/05 she noticed her left lower leg was swollen and warm to touch. She denies fevers or recent injury. Also admits discomfort to left lower leg, denies calf pain. She does have two small sores to back of left ankle that are slow healing. Reports these sores have not healed since time of injury 01/2021. She has been applying hydrocortisone/nystatin/zinc cream to sores daily.   She recently was seen by nephrology due to worsening kidney function. BUN/creat 33/1.70 04/2021. Furosemide reduced to 20 mg M/W/F. She was also started on amlodipine 5 mg daily. Swelling to lower legs were stable with medication changes. Increased swelling began 07/19/2021.   Review of Systems:  Review of Systems  Constitutional:  Negative for chills, fever and malaise/fatigue.  HENT: Negative.    Respiratory:  Negative for cough, shortness of breath and wheezing.   Cardiovascular:  Positive for leg swelling. Negative for chest pain, palpitations and orthopnea.  Musculoskeletal:  Positive for myalgias. Negative for falls and joint pain.  Skin:  Negative for itching and rash.  Neurological:  Negative for dizziness, weakness and headaches.  Psychiatric/Behavioral:  Negative for depression. The patient is not nervous/anxious.    Past Medical History:  Diagnosis Date   Anemia    Chronic kidney disease    stage 3   GERD (gastroesophageal reflux disease)    Hypertension    Past Surgical History:  Procedure Laterality Date   ORIF ANKLE FRACTURE Right 01/13/2021   Procedure: OPEN TREATMENT OF RIGHT  TRIMALLEOLAR ANKLE FRACTURE WITHOUT POSTERIOR FIXATION, POSSIBLE SYNDESMOSIS;  Surgeon: Erle Crocker, MD;  Location: [facility];  Service: Orthopedics;  Laterality: Right;   Social History:   reports that she quit smoking about 64 years ago. Her smoking use included cigarettes. She has never used smokeless tobacco. She reports that she does not currently use alcohol. She reports that she does not use drugs.  Family History  Problem Relation Age of Onset   Heart failure Mother    Heart disease Father    Arthritis Sister     Medications: Patient's Medications  New Prescriptions   No medications on file  Previous Medications   ACETAMINOPHEN (TYLENOL) 325 MG TABLET    Take 2 tablets (650 mg total) by mouth every 6 (six) hours as needed.   CALCIUM CARB-CHOLECALCIFEROL 500-400 MG-UNIT TABS    Take 1 tablet by mouth daily.   CHOLECALCIFEROL (VITAMIN D) 25 MCG (1000 UNIT) TABLET    Take 2 tablets (2,000 Units total) by mouth daily.   FERROUS SULFATE 325 (65 FE) MG TABLET    Take 1 tablet (325 mg total) by mouth daily with breakfast. MON, WED, FRI   FUROSEMIDE (LASIX) 20 MG TABLET    Take 1 tablet (20 mg total) by mouth daily.   FUROSEMIDE (LASIX) 40 MG TABLET    Take 1 tablet (40 mg total) by mouth daily.   HYDROCORTISONE 2.5%-NYSTATIN-ZINC OXIDE 20% 1:1:1 OINTMENT MIXTURE    Apply topically daily.   MIRABEGRON ER (MYRBETRIQ)  25 MG TB24 TABLET    Take 2 tablets (50 mg total) by mouth daily.   MULTIPLE VITAMINS-MINERALS (ICAPS AREDS 2 PO)    Take by mouth 2 (two) times daily.   PANTOPRAZOLE (PROTONIX) 40 MG TABLET    Take 1 tablet (40 mg total) by mouth daily.   POLYCARBOPHIL (FIBERCON) 625 MG TABLET    Take 1 tablet (625 mg total) by mouth as needed.   POTASSIUM CHLORIDE SA (KLOR-CON) 20 MEQ TABLET    Take 1 tablet (20 mEq total) by mouth daily.   POTASSIUM CHLORIDE SA (KLOR-CON) 20 MEQ TABLET    Take 2 tablets (40 mEq total) by mouth 2 (two) times daily.   VALSARTAN (DIOVAN) 160 MG TABLET     Take 1 tablet (160 mg total) by mouth daily.   VITAMIN C (ASCORBIC ACID) 250 MG TABLET    Take 1 tablet (250 mg total) by mouth daily.  Modified Medications   No medications on file  Discontinued Medications   No medications on file    Physical Exam:  There were no vitals filed for this visit. There is no height or weight on file to calculate BMI. Wt Readings from Last 3 Encounters:  06/18/21 118 lb (53.5 kg)  05/14/21 120 lb (54.4 kg)  04/30/21 119 lb 9.6 oz (54.3 kg)    Physical Exam Vitals reviewed.  Constitutional:      General: She is not in acute distress. HENT:     Head: Normocephalic.  Eyes:     General:        Right eye: No discharge.        Left eye: No discharge.  Cardiovascular:     Rate and Rhythm: Normal rate and regular rhythm.     Pulses: Normal pulses.     Heart sounds: Normal heart sounds. No murmur heard. Pulmonary:     Effort: Pulmonary effort is normal. No respiratory distress.     Breath sounds: Normal breath sounds. No wheezing or rales.  Abdominal:     General: Bowel sounds are normal. There is no distension.     Palpations: Abdomen is soft.     Tenderness: There is no abdominal tenderness.  Musculoskeletal:     Right lower leg: Edema present.     Left lower leg: Edema present.     Comments: L>R, 2+ pitting edema to left, non pitting to right.  Skin:    General: Skin is warm and dry.     Capillary Refill: Capillary refill takes less than 2 seconds.     Findings: Lesion present.     Comments: 2 pea sized sores to left lower extremity, CDI, covered with hydrocortisone cream- cannot visualize wound bed, no drainage or odor. LLE erythema and warmth noted to anterior and posterior sides, including calf. No sign of injury.   Neurological:     General: No focal deficit present.     Mental Status: She is alert and oriented to person, place, and time.     Motor: Weakness present.     Gait: Gait abnormal.     Comments: Cane  Psychiatric:         Mood and Affect: Mood normal.        Behavior: Behavior normal.    Labs reviewed: Basic Metabolic Panel: Recent Labs    11/20/20 0730 12/08/20 0828 12/20/20 0000 01/13/21 0741 03/03/21 0000 05/12/21 0715  NA 138 136   < > 135 138 138  K 4.2 4.6   < >  3.9 4.4 4.3  CL 102 102   < > 100 104 104  CO2 26 22   < > 26 28* 26  GLUCOSE 78 79  --  85  --  86  BUN 44* 42*   < > 23 31* 33*  CREATININE 1.89* 1.92*   < > 1.57* 1.6* 1.70*  CALCIUM 10.4 10.3   < > 9.4 9.5 9.5  TSH 2.48  --   --   --   --   --    < > = values in this interval not displayed.   Liver Function Tests: Recent Labs    12/08/20 0828 12/20/20 0000 12/23/20 0000 01/13/21 0741 05/12/21 0715  AST 21 20 26 22 19   ALT 12 15 13 17 12   ALKPHOS  --  52 57 63  --   BILITOT 0.4  --   --  0.7 0.4  PROT 6.7  --   --  6.0* 6.5  ALBUMIN  --  4.2 4.2 3.3*  --    No results for input(s): LIPASE, AMYLASE in the last 8760 hours. No results for input(s): AMMONIA in the last 8760 hours. CBC: Recent Labs    12/08/20 0828 12/20/20 0000 01/13/21 0741 01/22/21 0000 02/05/21 0000 02/12/21 0000 05/12/21 0715  WBC 6.5   < > 5.9   < > 5.7 5.7 5.9  NEUTROABS 3,244   < >  --    < > 3,386.00 3,392.00 3,292  HGB 8.6*   < > 8.0*   < > 7.7* 8.1* 9.3*  HCT 26.6*   < > 25.5*   < > 24* 25* 28.2*  MCV 98.5  --  100.8*  --   --   --  95.6  PLT 180   < > 241   < > 212 217 246   < > = values in this interval not displayed.   Lipid Panel: No results for input(s): CHOL, HDL, LDLCALC, TRIG, CHOLHDL, LDLDIRECT in the last 8760 hours. TSH: Recent Labs    11/20/20 0730  TSH 2.48   A1C: No results found for: HGBA1C   Assessment/Plan 1. Pain in left lower leg - onset 02/05 - erythema, swelling and warmth noted to LLE- including calf - intermittent discomfort also noted - suspect cellulitis versus DVT - unable to do doppler in [place] setting- per Teresa Crews - VAS [place] LOWER EXTREMITY VENOUS (DVT); Future  2. Cellulitis of left  lower leg - see above - sores slow healing - cont hydrocortisone/nystatin/zinc cream - recommend uncovering sores at night to promote scab formation - doxycycline (VIBRA-TABS) 100 MG tablet; Take 1 tablet (100 mg total) by mouth 2 (two) times daily.  Dispense: 14 tablet; Refill: 0  3. Left leg swelling - see above - furosemide reduced to 20 mg M/W/F due to kidney function - cont compression stockings daily - recommend elevating legs 2-4 hours daily in recliner - recommend low sodium diet   Total time: 30 minutes. Greater than 50% of total time spent doing patient education regarding LLE swelling and discomfort- treatment, symptom and medication management discussed.     Next appt: none Jenness Stemler [place], [facility] [phone]

## 2021-07-20 NOTE — Patient Instructions (Addendum)
Cannot perform ultrasound in independent living- orders sent to [facility] outpatient imaging at [facility]  Doxycycline antibiotic ordered- please finish- may start probiotic if GI upset/diarrhea  Continue compression stockings  Recommend leg elevation in recliner 2-4 hours daily  Recommend low sodium diet  Please report to ED if you develop chest pain or shortness of breath

## 2021-07-21 ENCOUNTER — Other Ambulatory Visit: Payer: Self-pay

## 2021-07-21 ENCOUNTER — Telehealth: Payer: Self-pay

## 2021-07-21 ENCOUNTER — Ambulatory Visit (HOSPITAL_COMMUNITY)
Admission: RE | Admit: 2021-07-21 | Discharge: 2021-07-21 | Disposition: A | Discharge status: Home / Self Care | Payer: Medicare Other | Source: Ambulatory Visit | Attending: Cardiovascular Disease | Admitting: Cardiovascular Disease

## 2021-07-21 DIAGNOSIS — M79662 Pain in left lower leg: Secondary | ICD-10-CM

## 2021-07-21 NOTE — Telephone Encounter (Signed)
Thank you  for update

## 2021-07-21 NOTE — Telephone Encounter (Signed)
Hannah with [facility]:  Reporting a negative venous on left leg and no bakers cyst.  To A. Cleophas Dunker, NP

## 2021-07-23 ENCOUNTER — Encounter: Payer: Medicare Other | Admitting: Nurse Practitioner

## 2021-07-27 ENCOUNTER — Encounter: Payer: Medicare Other | Admitting: Orthopedic Surgery

## 2021-07-30 ENCOUNTER — Non-Acute Institutional Stay: Payer: Medicare Other | Admitting: Nurse Practitioner

## 2021-07-30 ENCOUNTER — Encounter: Payer: Self-pay | Admitting: Nurse Practitioner

## 2021-07-30 ENCOUNTER — Other Ambulatory Visit: Payer: Self-pay

## 2021-07-30 DIAGNOSIS — N1831 Chronic kidney disease, stage 3a: Secondary | ICD-10-CM | POA: Diagnosis not present

## 2021-07-30 DIAGNOSIS — I10 Essential (primary) hypertension: Secondary | ICD-10-CM | POA: Diagnosis not present

## 2021-07-30 DIAGNOSIS — D649 Anemia, unspecified: Secondary | ICD-10-CM | POA: Diagnosis not present

## 2021-07-30 DIAGNOSIS — N390 Urinary tract infection, site not specified: Secondary | ICD-10-CM

## 2021-07-30 DIAGNOSIS — N3942 Incontinence without sensory awareness: Secondary | ICD-10-CM | POA: Diagnosis not present

## 2021-07-30 DIAGNOSIS — I872 Venous insufficiency (chronic) (peripheral): Secondary | ICD-10-CM

## 2021-07-30 DIAGNOSIS — L03116 Cellulitis of left lower limb: Secondary | ICD-10-CM

## 2021-07-30 NOTE — Assessment & Plan Note (Signed)
blood pressure is controlled, on Valsartan, Amlodipine 5mg  qd started 07/07/21 by Nephrology.

## 2021-07-30 NOTE — Assessment & Plan Note (Addendum)
chronic, on Furosemide, reduced by Nephrology 07/07/21 to 3x/wk. 07/2021 venous US LLE negative DVT. May consider increase Furosemide 5x/wk. Weight has gained about #2-3Ibs, no respiratory symptoms presently. Observe.

## 2021-07-30 NOTE — Assessment & Plan Note (Signed)
May consider Keflex 250mg  nightly x 3 months.

## 2021-07-30 NOTE — Progress Notes (Signed)
Location:   [facility]   Place of Service:    Provider: Marlana Latus NP  Code Status: DNR Goals of Care: [place] 06/18/2021  Does Patient Have a Medical Advance Directive? Yes  Type of Paramedic of [place];Living will;Out of facility DNR (pink MOST or yellow form)  Does patient want to make changes to medical advance directive? No - Patient declined  Copy of [place] in Chart? Yes - validated most recent copy scanned in chart (See row information)  Pre-existing out of facility DNR order (yellow form or pink MOST form) Yellow form placed in chart (order not valid for inpatient use);Pink MOST form placed in chart (order not valid for inpatient use)     Chief Complaint  Patient presents with   Medical Management of Chronic Issues    Patient presents today for a 4 weeks follow-up.   Quality Metric Gaps    COVID booster    HPI: Patient is a 88 y.o. female seen today for medical management of chronic diseases.    UTI 07/07/21 identified, treated by Nephrology, desires suppression therapy.    LLE, slow healing wound, applying Nystatin/Hydrocortisone presently.              12/23/20 R ankle fracture ( Fracture of the medial malleolus. Nondisplaced fracture of the distal fibula. S/p ORIF, in brace, WBAT, ambulates with walker.  Anemia, Hgb 9.3 05/12/21, on Pantoprazole for GI protection, FOBT negative x3, normal Fe, Vit B12, Folate. On Fe Edema BLE, chronic, on Furosemide, reduced by Nephrology 07/07/21 to 3x/wk. 07/2021 venous US LLE negative DVT Treated with Doxy for redness, warmth, swelling legs.  CKD Bun/creat 33/1.7011/29/22, saw nephrology 07/07/21             Her urinary frequency/leakage, uses pads, it seems better since taking Myrbetriq in pm per Urology             HTN, blood pressure is controlled, on Valsartan, Amlodipine 5mg  qd started 07/07/21 by Nephrology.              No constipation, taking Fibercon prn.                Osteoporosis, off Alendronate due to CKD, 11/05/20 DEXA t score -2.8             Fecal incontinence prn Imodium             Itching dry skin, prn Hydrocortisone cream, skin moisturizer.     Past Medical History:  Diagnosis Date   Anemia    Chronic kidney disease    stage 3   GERD (gastroesophageal reflux disease)    Hypertension     Past Surgical History:  Procedure Laterality Date   ORIF ANKLE FRACTURE Right 01/13/2021   Procedure: OPEN TREATMENT OF RIGHT TRIMALLEOLAR ANKLE FRACTURE WITHOUT POSTERIOR FIXATION, POSSIBLE SYNDESMOSIS;  Surgeon: Erle Crocker, MD;  Location: [facility];  Service: Orthopedics;  Laterality: Right;    Allergies  Allergen Reactions   Ace Inhibitors    Flagyl [Metronidazole]    Penicillins Swelling    facial    Allergies as of 07/30/2021       Reactions   Ace Inhibitors    Flagyl [metronidazole]    Penicillins Swelling   facial        Medication List        Accurate as of July 30, 2021  4:14 PM. If you have any questions, ask your nurse or  doctor.          acetaminophen 325 MG tablet Commonly known as: TYLENOL Take 2 tablets (650 mg total) by mouth every 6 (six) hours as needed.   amLODipine 5 MG tablet Commonly known as: NORVASC Take 5 mg by mouth daily.   Calcium Carb-Cholecalciferol 500-400 MG-UNIT Tabs Take 1 tablet by mouth daily.   cholecalciferol 25 MCG (1000 UNIT) tablet Commonly known as: VITAMIN D Take 2 tablets (2,000 Units total) by mouth daily.   doxycycline 100 MG tablet Commonly known as: VIBRA-TABS Take 1 tablet (100 mg total) by mouth 2 (two) times daily.   ferrous sulfate 325 (65 FE) MG tablet Take 1 tablet (325 mg total) by mouth daily with breakfast. MON, WED, FRI   furosemide 20 MG tablet Commonly known as: LASIX Take 1 tablet (20 mg total) by mouth daily. What changed: when to take this   hydrocortisone 2.5%-nystatin-zinc oxide 20% 1:1:1 ointment mixture Apply topically daily.   ICAPS  AREDS 2 PO Take by mouth 2 (two) times daily.   mirabegron ER 25 MG Tb24 tablet Commonly known as: MYRBETRIQ Take 2 tablets (50 mg total) by mouth daily.   pantoprazole 40 MG tablet Commonly known as: PROTONIX Take 1 tablet (40 mg total) by mouth daily.   polycarbophil 625 MG tablet Commonly known as: FIBERCON Take 1 tablet (625 mg total) by mouth as needed.   potassium chloride SA 20 MEQ tablet Commonly known as: KLOR-CON M Take 1 tablet (20 mEq total) by mouth daily.   potassium chloride SA 20 MEQ tablet Commonly known as: KLOR-CON M Take 2 tablets (40 mEq total) by mouth 2 (two) times daily.   valsartan 160 MG tablet Commonly known as: DIOVAN Take 1 tablet (160 mg total) by mouth daily.   vitamin C 250 MG tablet Commonly known as: ASCORBIC ACID Take 1 tablet (250 mg total) by mouth daily.        Review of Systems:  Review of Systems  Constitutional:  Positive for unexpected weight change. Negative for fatigue and fever.       Weight gained about #2-3 Ibs   HENT:  Positive for hearing loss. Negative for congestion and voice change.   Eyes:  Negative for visual disturbance.  Respiratory:  Negative for shortness of breath.   Cardiovascular:  Positive for leg swelling.  Gastrointestinal:  Negative for abdominal pain and constipation.       Fecal incontinent.   Genitourinary:  Positive for frequency. Negative for dysuria and urgency.       Occasionally incontinent of urine. Urination average every 4 hrs during day 1x/night.   Musculoskeletal:  Positive for arthralgias and gait problem.       Right lower leg/ankle brace  Skin:  Negative for color change.       Left calf skin tear is healed.   Neurological:  Negative for speech difficulty, weakness and light-headedness.       Memory lapses.   Psychiatric/Behavioral:  Negative for behavioral problems and sleep disturbance. The patient is not nervous/anxious.    Health Maintenance  Topic Date Due   COVID-19 Vaccine  (3 - Moderna risk series) 11/25/2020   Zoster Vaccines- Shingrix (1 of 2) 06/14/2022 (Originally 05/04/1948)   TETANUS/TDAP  11/26/2029   Pneumonia Vaccine 78+ Years old  Completed   INFLUENZA VACCINE  Completed   DEXA SCAN  Completed   HPV VACCINES  Aged Out    Physical Exam: Vitals:   07/30/21 1403  BP: 138/84  Pulse: 87  Temp: (!) 95.6 F (35.3 C)  SpO2: 98%  Weight: 122 lb 3.2 oz (55.4 kg)  Height: 5' 4.5" (1.638 m)   Body mass index is 20.65 kg/m. Physical Exam Vitals reviewed.  Constitutional:      Appearance: Normal appearance.  HENT:     Head: Normocephalic and atraumatic.     Nose: Nose normal.     Mouth/Throat:     Mouth: Mucous membranes are moist.  Eyes:     Extraocular Movements: Extraocular movements intact.     Conjunctiva/sclera: Conjunctivae normal.     Pupils: Pupils are equal, round, and reactive to light.  Cardiovascular:     Rate and Rhythm: Normal rate and regular rhythm.     Heart sounds: No murmur heard. Pulmonary:     Breath sounds: No rales.  Abdominal:     General: Bowel sounds are normal.     Palpations: Abdomen is soft.     Tenderness: There is no abdominal tenderness.  Musculoskeletal:        General: No tenderness.     Cervical back: Normal range of motion and neck supple.     Right lower leg: Edema present.     Left lower leg: Edema present.     Comments: Right ankle in brace, s/p ORIF 01/13/21. Edema BLE 1-2+  Skin:    General: Skin is warm and dry.     Comments: Chronic pigmented venous insufficiency skin changes BLE. Left calf skin tear is healed, residual mild redness.   Neurological:     Mental Status: She is alert. Mental status is at baseline.     Gait: Gait abnormal.     Comments: Oriented to person, place.   Psychiatric:        Mood and Affect: Mood normal.        Behavior: Behavior normal.        Thought Content: Thought content normal.    Labs reviewed: Basic Metabolic Panel: Recent Labs    11/20/20 0730  12/08/20 0828 12/20/20 0000 01/13/21 0741 03/03/21 0000 05/12/21 0715  NA 138 136   < > 135 138 138  K 4.2 4.6   < > 3.9 4.4 4.3  CL 102 102   < > 100 104 104  CO2 26 22   < > 26 28* 26  GLUCOSE 78 79  --  85  --  86  BUN 44* 42*   < > 23 31* 33*  CREATININE 1.89* 1.92*   < > 1.57* 1.6* 1.70*  CALCIUM 10.4 10.3   < > 9.4 9.5 9.5  TSH 2.48  --   --   --   --   --    < > = values in this interval not displayed.   Liver Function Tests: Recent Labs    12/08/20 0828 12/20/20 0000 12/23/20 0000 01/13/21 0741 05/12/21 0715  AST 21 20 26 22 19   ALT 12 15 13 17 12   ALKPHOS  --  52 57 63  --   BILITOT 0.4  --   --  0.7 0.4  PROT 6.7  --   --  6.0* 6.5  ALBUMIN  --  4.2 4.2 3.3*  --    No results for input(s): LIPASE, AMYLASE in the last 8760 hours. No results for input(s): AMMONIA in the last 8760 hours. CBC: Recent Labs    12/08/20 0828 12/20/20 0000 01/13/21 0741 01/22/21 0000 02/05/21 0000 02/12/21 0000 05/12/21 0715  WBC 6.5   < >  5.9   < > 5.7 5.7 5.9  NEUTROABS 3,244   < >  --    < > 3,386.00 3,392.00 3,292  HGB 8.6*   < > 8.0*   < > 7.7* 8.1* 9.3*  HCT 26.6*   < > 25.5*   < > 24* 25* 28.2*  MCV 98.5  --  100.8*  --   --   --  95.6  PLT 180   < > 241   < > 212 217 246   < > = values in this interval not displayed.   Lipid Panel: No results for input(s): CHOL, HDL, LDLCALC, TRIG, CHOLHDL, LDLDIRECT in the last 8760 hours. No results found for: HGBA1C  Procedures since last visit: VAS [place] LOWER EXTREMITY VENOUS (DVT)  Result Date: 07/21/2021  Lower Venous DVT Study Patient Name:  Peggy [name]  Date of Exam:   07/21/2021 Medical Rec #: [id number]           Accession #:    [id number] Date of Birth: 1929-02-25          Patient Gender: F Patient Age:   89 years Exam Location:  [facility] Procedure:      VAS [place] LOWER EXTREMITY VENOUS (DVT) Referring Phys: AMY FARGO --------------------------------------------------------------------------------  Other Indications:  Patient reports swelling and warmness in left leg from knee                    to ankle for the last three days. Patient denies any                    shortness of breath or chest pain. Comparison Study: Previous 11/22/2017 lower right venous reported no DVT or                   Baker's cyst. Performing Technologist: Leavy Cella RDCS  Examination Guidelines: A complete evaluation includes B-mode imaging, spectral Doppler, color Doppler, and power Doppler as needed of all accessible portions of each vessel. Bilateral testing is considered an integral part of a complete examination. Limited examinations for reoccurring indications may be performed as noted. The reflux portion of the exam is performed with the patient in reverse Trendelenburg.  +-----+---------------+---------+-----------+----------+--------------+  RIGHT Compressibility Phasicity Spontaneity Properties Thrombus Aging  +-----+---------------+---------+-----------+----------+--------------+  CFV   Full            Yes       Yes                                    +-----+---------------+---------+-----------+----------+--------------+   +---------+---------------+---------+-----------+----------+--------------+  LEFT      Compressibility Phasicity Spontaneity Properties Thrombus Aging  +---------+---------------+---------+-----------+----------+--------------+  CFV       Full            Yes       Yes                                    +---------+---------------+---------+-----------+----------+--------------+  SFJ       Full            Yes       Yes                                    +---------+---------------+---------+-----------+----------+--------------+  FV Prox   Full            Yes       Yes                                    +---------+---------------+---------+-----------+----------+--------------+  FV Mid    Full            Yes       Yes                                     +---------+---------------+---------+-----------+----------+--------------+  FV Distal Full            Yes       Yes                                    +---------+---------------+---------+-----------+----------+--------------+  PFV       Full                                                             +---------+---------------+---------+-----------+----------+--------------+  POP       Full            Yes       Yes                                    +---------+---------------+---------+-----------+----------+--------------+  PTV       Full            Yes       Yes                                    +---------+---------------+---------+-----------+----------+--------------+  PERO      Full            Yes       Yes                                    +---------+---------------+---------+-----------+----------+--------------+  Gastroc   Full                                                             +---------+---------------+---------+-----------+----------+--------------+  GSV       Full            Yes       Yes                                    +---------+---------------+---------+-----------+----------+--------------+ Interstitial fluid noted in the proximal calf. Enlarged lymph nodes noted near the mid GSV.   Findings reported to Earvin Hansen at 2:03pm.  Summary: RIGHT: -  No evidence of common femoral vein obstruction.  LEFT: - No evidence of deep vein thrombosis in the lower extremity. No indirect evidence of obstruction proximal to the inguinal ligament. - No cystic structure found in the popliteal fossa. - Enlarged lymph nodes near mid GSV.  *See table(s) above for measurements and observations. Electronically signed by Jenkins Rouge MD on 07/21/2021 at 5:53:31 PM.    Final     Assessment/Plan  UTI (urinary tract infection) May consider Keflex 250mg  nightly x 3 months.   Cellulitis of left lower leg slow healing wound, applying Nystatin/Hydrocortisone presently.Treated with Doxy for redness,  warmth, swelling legs.   Anemia Hgb 9.3 05/12/21, on Pantoprazole for GI protection, FOBT negative x3, normal Fe, Vit B12, Folate. On Fe  Venous insufficiency (chronic) (peripheral) chronic, on Furosemide, reduced by Nephrology 07/07/21 to 3x/wk. 07/2021 venous US LLE negative DVT. May consider increase Furosemide 5x/wk. Weight has gained about #2-3Ibs, no respiratory symptoms presently. Observe.   CKD (chronic kidney disease) stage 3, GFR 30-59 ml/min (HCC) Bun/creat 33/1.7011/29/22, saw nephrology 07/07/21  Incontinent of urine  Her urinary frequency/leakage, uses pads, it seems better since taking Myrbetriq in pm per Urology  Hypertension blood pressure is controlled, on Valsartan, Amlodipine 5mg  qd started 07/07/21 by Nephrology.    Labs/tests ordered:  none  Next appt:  AWV 2 weeks. F/u in clinic 4 weeks.

## 2021-07-30 NOTE — Assessment & Plan Note (Signed)
slow healing wound, applying Nystatin/Hydrocortisone presently.Treated with Doxy for redness, warmth, swelling legs.

## 2021-07-30 NOTE — Assessment & Plan Note (Signed)
Bun/creat 33/1.7011/29/22, saw nephrology 07/07/21

## 2021-07-30 NOTE — Assessment & Plan Note (Signed)
Hgb 9.3 05/12/21, on Pantoprazole for GI protection, FOBT negative x3, normal Fe, Vit B12, Folate. On Fe

## 2021-07-30 NOTE — Assessment & Plan Note (Signed)
Her urinary frequency/leakage, uses pads, it seems better since taking Myrbetriq in pm per Urology

## 2021-08-13 ENCOUNTER — Non-Acute Institutional Stay (INDEPENDENT_AMBULATORY_CARE_PROVIDER_SITE_OTHER): Payer: Medicare Other | Admitting: Nurse Practitioner

## 2021-08-13 ENCOUNTER — Other Ambulatory Visit: Payer: Self-pay

## 2021-08-13 ENCOUNTER — Encounter: Payer: Self-pay | Admitting: Nurse Practitioner

## 2021-08-13 VITALS — BP 150/78 | HR 80 | Temp 94.8°F | Ht 64.5 in | Wt 120.2 lb

## 2021-08-13 DIAGNOSIS — Z0000 Encounter for general adult medical examination without abnormal findings: Secondary | ICD-10-CM | POA: Diagnosis not present

## 2021-08-13 NOTE — Progress Notes (Deleted)
Location:   clinic [place]   Place of Service:    Provider: Marlana Latus NP  Code Status: DNR Goals of Care: [place]ives 06/18/2021  Does Patient Have a Medical Advance Directive? Yes  Type of Paramedic of [place];Living will;Out of facility DNR (pink MOST or yellow form)  Does patient want to make changes to medical advance directive? No - Patient declined  Copy of [place] in Chart? Yes - validated most recent copy scanned in chart (See row information)  Pre-existing out of facility DNR order (yellow form or pink MOST form) Yellow form placed in chart (order not valid for inpatient use);Pink MOST form placed in chart (order not valid for inpatient use)     No chief complaint on file.   HPI: Patient is a 88 y.o. female seen today for medical management of chronic diseases.     UTI 07/07/21 identified, treated by Nephrology, desires suppression therapy if frequent occurrence in the future.               LLE, slow healing wound, applying Nystatin/Hydrocortisone presently.              12/23/20 R ankle fracture ( Fracture of the medial malleolus. Nondisplaced fracture of the distal fibula. S/p ORIF, in brace, WBAT, ambulates with walker.  Anemia, Hgb 9.3 05/12/21, on Pantoprazole for GI protection, FOBT negative x3, normal Fe, Vit B12, Folate. On Fe Edema BLE, chronic, on Furosemide, reduced by Nephrology 07/07/21 to 3x/wk. 07/2021 venous US LLE negative DVT Treated with Doxy for redness, warmth, swelling legs.  CKD Bun/creat 33/1.7011/29/22, saw nephrology 07/07/21             Her urinary frequency/leakage, uses pads, it seems better since taking Myrbetriq in pm per Urology             HTN, blood pressure is controlled, on Valsartan, Amlodipine 5mg  qd started 07/07/21 by Nephrology.              No constipation, taking Fibercon prn.               Osteoporosis, off Alendronate due to CKD, 11/05/20 DEXA t score -2.8             Fecal  incontinence prn Imodium             Itching dry skin, prn Hydrocortisone cream, skin moisturizer.   Past Medical History:  Diagnosis Date   Anemia    Chronic kidney disease    stage 3   GERD (gastroesophageal reflux disease)    Hypertension     Past Surgical History:  Procedure Laterality Date   ORIF ANKLE FRACTURE Right 01/13/2021   Procedure: OPEN TREATMENT OF RIGHT TRIMALLEOLAR ANKLE FRACTURE WITHOUT POSTERIOR FIXATION, POSSIBLE SYNDESMOSIS;  Surgeon: Erle Crocker, MD;  Location: [facility];  Service: Orthopedics;  Laterality: Right;    Allergies  Allergen Reactions   Ace Inhibitors    Flagyl [Metronidazole]    Penicillins Swelling    facial    Allergies as of 08/13/2021       Reactions   Ace Inhibitors    Flagyl [metronidazole]    Penicillins Swelling   facial        Medication List        Accurate as of August 13, 2021 11:07 AM. If you have any questions, ask your nurse or doctor.          acetaminophen 325 MG tablet Commonly  known as: TYLENOL Take 2 tablets (650 mg total) by mouth every 6 (six) hours as needed.   amLODipine 5 MG tablet Commonly known as: NORVASC Take 5 mg by mouth daily.   Calcium Carb-Cholecalciferol 500-400 MG-UNIT Tabs Take 1 tablet by mouth daily.   cholecalciferol 25 MCG (1000 UNIT) tablet Commonly known as: VITAMIN D Take 2 tablets (2,000 Units total) by mouth daily.   doxycycline 100 MG tablet Commonly known as: VIBRA-TABS Take 1 tablet (100 mg total) by mouth 2 (two) times daily.   ferrous sulfate 325 (65 FE) MG tablet Take 1 tablet (325 mg total) by mouth daily with breakfast. MON, WED, FRI   furosemide 20 MG tablet Commonly known as: LASIX Take 1 tablet (20 mg total) by mouth daily. What changed: when to take this   hydrocortisone 2.5%-nystatin-zinc oxide 20% 1:1:1 ointment mixture Apply topically daily.   ICAPS AREDS 2 PO Take by mouth 2 (two) times daily.   mirabegron ER 25 MG Tb24 tablet Commonly known  as: MYRBETRIQ Take 2 tablets (50 mg total) by mouth daily.   pantoprazole 40 MG tablet Commonly known as: PROTONIX Take 1 tablet (40 mg total) by mouth daily.   polycarbophil 625 MG tablet Commonly known as: FIBERCON Take 1 tablet (625 mg total) by mouth as needed.   potassium chloride SA 20 MEQ tablet Commonly known as: KLOR-CON M Take 1 tablet (20 mEq total) by mouth daily.   potassium chloride SA 20 MEQ tablet Commonly known as: KLOR-CON M Take 2 tablets (40 mEq total) by mouth 2 (two) times daily.   valsartan 160 MG tablet Commonly known as: DIOVAN Take 1 tablet (160 mg total) by mouth daily.   vitamin C 250 MG tablet Commonly known as: ASCORBIC ACID Take 1 tablet (250 mg total) by mouth daily.        Review of Systems:  Review of Systems  Health Maintenance  Topic Date Due   COVID-19 Vaccine (3 - Moderna risk series) 11/25/2020   Zoster Vaccines- Shingrix (1 of 2) 06/14/2022 (Originally 05/04/1948)   TETANUS/TDAP  11/26/2029   Pneumonia Vaccine 40+ Years old  Completed   INFLUENZA VACCINE  Completed   DEXA SCAN  Completed   HPV VACCINES  Aged Out    Physical Exam: There were no vitals filed for this visit. There is no height or weight on file to calculate BMI. Physical Exam  Labs reviewed: Basic Metabolic Panel: Recent Labs    11/20/20 0730 12/08/20 0828 12/20/20 0000 01/13/21 0741 03/03/21 0000 05/12/21 0715  NA 138 136   < > 135 138 138  K 4.2 4.6   < > 3.9 4.4 4.3  CL 102 102   < > 100 104 104  CO2 26 22   < > 26 28* 26  GLUCOSE 78 79  --  85  --  86  BUN 44* 42*   < > 23 31* 33*  CREATININE 1.89* 1.92*   < > 1.57* 1.6* 1.70*  CALCIUM 10.4 10.3   < > 9.4 9.5 9.5  TSH 2.48  --   --   --   --   --    < > = values in this interval not displayed.   Liver Function Tests: Recent Labs    12/08/20 0828 12/20/20 0000 12/23/20 0000 01/13/21 0741 05/12/21 0715  AST 21 20 26 22 19   ALT 12 15 13 17 12   ALKPHOS  --  52 57 63  --   BILITOT  0.4  --   --  0.7 0.4  PROT 6.7  --   --  6.0* 6.5  ALBUMIN  --  4.2 4.2 3.3*  --    No results for input(s): LIPASE, AMYLASE in the last 8760 hours. No results for input(s): AMMONIA in the last 8760 hours. CBC: Recent Labs    12/08/20 0828 12/20/20 0000 01/13/21 0741 01/22/21 0000 02/05/21 0000 02/12/21 0000 05/12/21 0715  WBC 6.5   < > 5.9   < > 5.7 5.7 5.9  NEUTROABS 3,244   < >  --    < > 3,386.00 3,392.00 3,292  HGB 8.6*   < > 8.0*   < > 7.7* 8.1* 9.3*  HCT 26.6*   < > 25.5*   < > 24* 25* 28.2*  MCV 98.5  --  100.8*  --   --   --  95.6  PLT 180   < > 241   < > 212 217 246   < > = values in this interval not displayed.   Lipid Panel: No results for input(s): CHOL, HDL, LDLCALC, TRIG, CHOLHDL, LDLDIRECT in the last 8760 hours. No results found for: HGBA1C  Procedures since last visit: VAS [place] LOWER EXTREMITY VENOUS (DVT)  Result Date: 07/21/2021  Lower Venous DVT Study Patient Name:  Peggy [name]  Date of Exam:   07/21/2021 Medical Rec #: [id number]           Accession #:    [id number] Date of Birth: 05/16/1929          Patient Gender: F Patient Age:   46 years Exam Location:  [facility] Procedure:      VAS [place] LOWER EXTREMITY VENOUS (DVT) Referring Phys: AMY FARGO --------------------------------------------------------------------------------  Other Indications: Patient reports swelling and warmness in left leg from knee                    to ankle for the last three days. Patient denies any                    shortness of breath or chest pain. Comparison Study: Previous 11/22/2017 lower right venous reported no DVT or                   Baker's cyst. Performing Technologist: Leavy Cella RDCS  Examination Guidelines: A complete evaluation includes B-mode imaging, spectral Doppler, color Doppler, and power Doppler as needed of all accessible portions of each vessel. Bilateral testing is considered an integral part of a complete examination. Limited examinations for reoccurring  indications may be performed as noted. The reflux portion of the exam is performed with the patient in reverse Trendelenburg.  +-----+---------------+---------+-----------+----------+--------------+  RIGHT Compressibility Phasicity Spontaneity Properties Thrombus Aging  +-----+---------------+---------+-----------+----------+--------------+  CFV   Full            Yes       Yes                                    +-----+---------------+---------+-----------+----------+--------------+   +---------+---------------+---------+-----------+----------+--------------+  LEFT      Compressibility Phasicity Spontaneity Properties Thrombus Aging  +---------+---------------+---------+-----------+----------+--------------+  CFV       Full            Yes       Yes                                    +---------+---------------+---------+-----------+----------+--------------+  SFJ       Full            Yes       Yes                                    +---------+---------------+---------+-----------+----------+--------------+  FV Prox   Full            Yes       Yes                                    +---------+---------------+---------+-----------+----------+--------------+  FV Mid    Full            Yes       Yes                                    +---------+---------------+---------+-----------+----------+--------------+  FV Distal Full            Yes       Yes                                    +---------+---------------+---------+-----------+----------+--------------+  PFV       Full                                                             +---------+---------------+---------+-----------+----------+--------------+  POP       Full            Yes       Yes                                    +---------+---------------+---------+-----------+----------+--------------+  PTV       Full            Yes       Yes                                    +---------+---------------+---------+-----------+----------+--------------+  PERO      Full             Yes       Yes                                    +---------+---------------+---------+-----------+----------+--------------+  Gastroc   Full                                                             +---------+---------------+---------+-----------+----------+--------------+  GSV       Full            Yes  Yes                                    +---------+---------------+---------+-----------+----------+--------------+ Interstitial fluid noted in the proximal calf. Enlarged lymph nodes noted near the mid GSV.   Findings reported to Earvin Hansen at 2:03pm.  Summary: RIGHT: - No evidence of common femoral vein obstruction.  LEFT: - No evidence of deep vein thrombosis in the lower extremity. No indirect evidence of obstruction proximal to the inguinal ligament. - No cystic structure found in the popliteal fossa. - Enlarged lymph nodes near mid GSV.  *See table(s) above for measurements and observations. Electronically signed by Jenkins Rouge MD on 07/21/2021 at 5:53:31 PM.    Final     Assessment/Plan  No problem-specific Assessment & Plan notes found for this encounter.   Labs/tests ordered:  none  Next appt:  09/10/2021

## 2021-08-13 NOTE — Progress Notes (Signed)
Subjective:   Peggy [name] is a 88 y.o. female who presents for Medicare Annual (Subsequent) preventive examination in clinic [facility] Cardiac Risk Factors include: hypertension     Objective:    Today's Vitals   08/13/21 1416 08/13/21 1447  BP: (!) 150/78   Pulse: 80   Temp: (!) 94.8 F (34.9 C)   SpO2: 95%   Weight: 120 lb 3.2 oz (54.5 kg)   Height: 5' 4.5" (1.638 m)   PainSc:  2    Body mass index is 20.31 kg/m.  Advanced Directives 08/13/2021 06/18/2021 05/14/2021 04/23/2021 04/06/2021 03/17/2021 02/18/2021  Does Patient Have a Medical Advance Directive? Yes Yes Yes Yes Yes Yes Yes  Type of Paramedic of [place];Living will;Out of facility DNR (pink MOST or yellow form) [place];Living will;Out of facility DNR (pink MOST or yellow form) [place];Living will;Out of facility DNR (pink MOST or yellow form) [place];Living will;Out of facility DNR (pink MOST or yellow form) [place];Living will;Out of facility DNR (pink MOST or yellow form) [place];Living will;Out of facility DNR (pink MOST or yellow form) [place];Living will;Out of facility DNR (pink MOST or yellow form)  Does patient want to make changes to medical advance directive? No - Patient declined No - Patient declined No - Patient declined No - Patient declined No - Patient declined No - Patient declined No - Patient declined  Copy of [place] in Chart? Yes - validated most recent copy scanned in chart (See row information) Yes - validated most recent copy scanned in chart (See row information) Yes - validated most recent copy scanned in chart (See row information) Yes - validated most recent copy scanned in chart (See row information) Yes - validated most recent copy scanned in chart (See row information) Yes - validated most recent copy scanned in chart (See row  information) Yes - validated most recent copy scanned in chart (See row information)  Pre-existing out of facility DNR order (yellow form or pink MOST form) Pink Most/Yellow Form available - Physician notified to receive inpatient order Yellow form placed in chart (order not valid for inpatient use);Pink MOST form placed in chart (order not valid for inpatient use) Yellow form placed in chart (order not valid for inpatient use);Pink MOST form placed in chart (order not valid for inpatient use) Yellow form placed in chart (order not valid for inpatient use);Pink MOST form placed in chart (order not valid for inpatient use) - Yellow form placed in chart (order not valid for inpatient use);Pink MOST form placed in chart (order not valid for inpatient use) Yellow form placed in chart (order not valid for inpatient use);Pink MOST form placed in chart (order not valid for inpatient use)    Current Medications (verified) Outpatient Encounter Medications as of 08/13/2021  Medication Sig   acetaminophen (TYLENOL) 325 MG tablet Take 2 tablets (650 mg total) by mouth every 6 (six) hours as needed.   amLODipine (NORVASC) 5 MG tablet Take 5 mg by mouth daily.   Calcium Carb-Cholecalciferol 500-400 MG-UNIT TABS Take 1 tablet by mouth daily.   cholecalciferol (VITAMIN D) 25 MCG (1000 UNIT) tablet Take 2 tablets (2,000 Units total) by mouth daily.   ferrous sulfate 325 (65 FE) MG tablet Take 1 tablet (325 mg total) by mouth daily with breakfast. MON, WED, FRI   furosemide (LASIX) 20 MG tablet Take 1 tablet (20 mg total)  by mouth daily. (Patient taking differently: Take 20 mg by mouth 3 (three) times a week.)   hydrocortisone 2.5%-nystatin-zinc oxide 20% 1:1:1 ointment mixture Apply topically daily.   mirabegron ER (MYRBETRIQ) 25 MG TB24 tablet Take 2 tablets (50 mg total) by mouth daily.   Multiple Vitamins-Minerals (ICAPS AREDS 2 PO) Take by mouth 2 (two) times daily.   pantoprazole (PROTONIX) 40 MG tablet Take 1  tablet (40 mg total) by mouth daily.   polycarbophil (FIBERCON) 625 MG tablet Take 1 tablet (625 mg total) by mouth as needed.   valsartan (DIOVAN) 160 MG tablet Take 1 tablet (160 mg total) by mouth daily.   vitamin C (ASCORBIC ACID) 250 MG tablet Take 1 tablet (250 mg total) by mouth daily.   [DISCONTINUED] doxycycline (VIBRA-TABS) 100 MG tablet Take 1 tablet (100 mg total) by mouth 2 (two) times daily.   [DISCONTINUED] potassium chloride SA (KLOR-CON) 20 MEQ tablet Take 1 tablet (20 mEq total) by mouth daily. (Patient not taking: Reported on 07/30/2021)   [DISCONTINUED] potassium chloride SA (KLOR-CON) 20 MEQ tablet Take 2 tablets (40 mEq total) by mouth 2 (two) times daily. (Patient not taking: Reported on 07/30/2021)   No facility-administered encounter medications on file as of 08/13/2021.    Allergies (verified) Ace inhibitors, Flagyl [metronidazole], and Penicillins   History: Past Medical History:  Diagnosis Date   Anemia    Chronic kidney disease    stage 3   GERD (gastroesophageal reflux disease)    Hypertension    Past Surgical History:  Procedure Laterality Date   ORIF ANKLE FRACTURE Right 01/13/2021   Procedure: OPEN TREATMENT OF RIGHT TRIMALLEOLAR ANKLE FRACTURE WITHOUT POSTERIOR FIXATION, POSSIBLE SYNDESMOSIS;  Surgeon: Erle Crocker, MD;  Location: [facility];  Service: Orthopedics;  Laterality: Right;   Family History  Problem Relation Age of Onset   Heart failure Mother    Heart disease Father    Arthritis Sister    Social History   Socioeconomic History   Marital status: Widowed    Spouse name: Tyrone Nine   Number of children: 2   Years of education: Not on file   Highest education level: Not on file  Occupational History   Occupation: Pharmacist, hospital    Comment: retired  Tobacco Use   Smoking status: Former    Years: 12.00    Types: Cigarettes    Quit date: 06/14/1957    Years since quitting: 64.2   Smokeless tobacco: Never  Vaping Use   Vaping Use: Never used   Substance and Sexual Activity   Alcohol use: Not Currently   Drug use: No   Sexual activity: Not on file  Other Topics Concern   Not on file  Social History Narrative   Social History     Socioeconomic History       Marital status: Married   09/01/1954       Spouse name: Tyrone Nine   Two people stay in the home on the fourth floor       Number of children: 2       Years of education:       Highest education level: Not on file    Exercise: yoga, walking     Social Needs       Financial resource strain: Not on file       Food insecurity - worry: Not on file       Food insecurity - inability: Not on file       Transportation needs -  medical: Not on file       Transportation needs - non-medical: Not on file     Occupational History       Not on file     Tobacco Use       Smoking status: Never Smoker       Smokeless tobacco: Never Used     Substance and Sexual Activity       Alcohol use: No       Drug use: No       Sexual activity: Not on file     Other Topics       Concerns:         Not on file     Social History Narrative       Not on file   Social Determinants of Health   Financial Resource Strain: Not on file  Food Insecurity: Not on file  Transportation Needs: Not on file  Physical Activity: Not on file  Stress: Not on file  Social Connections: Not on file    Tobacco Counseling Counseling given: Not Answered   Clinical Intake:  Pre-visit preparation completed: Yes  Pain : 0-10 Pain Score: 2  Pain Location: Knee Pain Orientation: Right, Left Pain Descriptors / Indicators: Aching Pain Onset: More than a month ago Pain Frequency: Occasional Pain Relieving Factors: rest Effect of Pain on Daily Activities: non  Pain Relieving Factors: rest  BMI - recorded: 20.31 Nutritional Status: BMI of 19-24  Normal Nutritional Risks: None Diabetes: No  How often do you need to have someone help you when you read instructions, pamphlets, or other written materials  from your doctor or pharmacy?: 1 - Never What is the last grade level you completed in school?: college  Diabetic?no  Interpreter Needed?: No  Information entered by :: Aleera Gilcrease Bretta Bang NP   Activities of Daily Living In your present state of health, do you have any difficulty performing the following activities: 08/13/2021 01/13/2021  Hearing? N N  Vision? N N  Difficulty concentrating or making decisions? Tempie Donning  Walking or climbing stairs? Y Y  Comment Climbing stairs -  Dressing or bathing? N Y  Doing errands, shopping? N -  Preparing Food and eating ? N -  Using the Toilet? N -  In the past six months, have you accidently leaked urine? Y -  Do you have problems with loss of bowel control? N -  Managing your Medications? Y -  Managing your Finances? N -  Housekeeping or managing your Housekeeping? Y -  Some recent data might be hidden    Patient Care Team: Whitfield Dulay X, NP as PCP - General (Internal Medicine)  Indicate any recent Medical Services you may have received from other than [facility] providers in the past year (date may be approximate).     Assessment:   This is a routine wellness examination for Nakiea.  Hearing/Vision screen Hearing Screening - Comments:: No hearing concerns or doesn't wear hearing aids. Vision Screening - Comments:: No concerns with vision or wear glasses. But wear reading glasses.  Dietary issues and exercise activities discussed: Current Exercise Habits: Home exercise routine;Structured exercise class, Type of exercise: stretching;walking, Time (Minutes): 30, Frequency (Times/Week): 3, Weekly Exercise (Minutes/Week): 90, Intensity: Mild   Goals Addressed             This Visit's Progress    Maintain Mobility and Function       Evidence-based guidance:  Emphasize the importance of physical activity and  aerobic exercise as included in treatment plan; assess barriers to adherence; consider patient's abilities and preferences.  Encourage gradual  increase in activity or exercise instead of stopping if pain occurs.  Reinforce individual therapy exercise prescription, such as strengthening, stabilization and stretching programs.  Promote optimal body mechanics to stabilize the spine with lifting and functional activity.  Encourage activity and mobility modifications to facilitate optimal function, such as using a log roll for bed mobility or dressing from a seated position.  Reinforce individual adaptive equipment recommendations to limit excessive spinal movements, such as a Systems analyst.  Assess adequacy of sleep; encourage use of sleep hygiene techniques, such as bedtime routine; use of white noise; dark, cool bedroom; avoiding daytime naps, heavy meals or exercise before bedtime.  Promote positions and modification to optimize sleep and sexual activity; consider pillows or positioning devices to assist in maintaining neutral spine.  Explore options for applying ergonomic principles at work and home, such as frequent position changes, using ergonomically designed equipment and working at optimal height.  Promote modifications to increase comfort with driving such as lumbar support, optimizing seat and steering wheel position, using cruise control and taking frequent rest stops to stretch and walk.   Notes:        Depression Screen PHQ 2/9 Scores 08/13/2021 06/18/2021 04/16/2021 10/20/2017 08/10/2017  PHQ - 2 Score 0 0 0 0 0    Fall Risk Fall Risk  08/13/2021 07/30/2021 06/18/2021 05/14/2021 04/16/2021  Falls in the past year? 0 0 0 1 0  Number falls in past yr: 0 0 0 0 0  Injury with Fall? 0 0 0 1 0  Risk for fall due to : History of fall(s) No Fall Risks No Fall Risks History of fall(s) No Fall Risks  Follow up Falls evaluation completed;Education provided;Falls prevention discussed Falls evaluation completed;Education provided;Falls prevention discussed Falls evaluation completed Falls evaluation completed Falls evaluation completed     FALL RISK PREVENTION PERTAINING TO THE HOME:  Any stairs in or around the home? Yes  If so, are there any without handrails? No  Home free of loose throw rugs in walkways, pet beds, electrical cords, etc? Yes  Adequate lighting in your home to reduce risk of falls? Yes   ASSISTIVE DEVICES UTILIZED TO PREVENT FALLS:  Life alert? No  Use of a cane, walker or w/c? Yes  Grab bars in the bathroom? Yes  Shower chair or bench in shower? Yes  Elevated toilet seat or a handicapped toilet? Yes   TIMED UP AND GO:  Was the test performed? Yes .  Length of time to ambulate 10 feet: 10 sec.   Gait slow and steady with assistive device  Cognitive Function: MMSE - Mini Mental State Exam 08/13/2021 08/13/2021 10/20/2017  Orientation to time 2 2 4   Orientation to Place 5 5 5   Registration 3 3 3   Attention/ Calculation 5 5 5   Recall 2 2 2   Language- name 2 objects 2 2 2   Language- repeat 1 1 1   Language- follow 3 step command 3 3 3   Language- read & follow direction 1 1 1   Write a sentence 1 1 1   Copy design 1 1 1   Total score 26 26 28         Immunizations Immunization History  Administered Date(s) Administered   Fluad Quad(high Dose 65+) 03/28/2019   H1N1 03/18/2009   Influenza, High Dose Seasonal PF 03/25/2017, 03/16/2018, 03/14/2021   Influenza-Unspecified 02/25/2011, 03/31/2012, 03/27/2013, 03/14/2014, 03/25/2016, 06/14/2017   Moderna  SARS-COV2 Booster Vaccination 10/28/2020   Moderna Sars-Covid-2 Vaccination 06/18/2019, 07/16/2019   Pneumococcal Conjugate-13 09/10/2014   Pneumococcal Polysaccharide-23 11/03/2014   Pneumococcal-Unspecified 06/14/2017   Tdap 11/27/2019    TDAP status: Due, Education has been provided regarding the importance of this vaccine. Advised may receive this vaccine at local pharmacy or Health Dept. Aware to provide a copy of the vaccination record if obtained from local pharmacy or Health Dept. Verbalized acceptance and understanding.  Flu Vaccine  status: Up to date  Pneumococcal vaccine status: Up to date  Covid-19 vaccine status: Information provided on how to obtain vaccines.   Qualifies for Shingles Vaccine? Yes   Zostavax completed No   Shingrix Completed?: No.    Education has been provided regarding the importance of this vaccine. Patient has been advised to call insurance company to determine out of pocket expense if they have not yet received this vaccine. Advised may also receive vaccine at local pharmacy or Health Dept. Verbalized acceptance and understanding.  Screening Tests Health Maintenance  Topic Date Due   COVID-19 Vaccine (3 - Moderna risk series) 11/25/2020   Zoster Vaccines- Shingrix (1 of 2) 06/14/2022 (Originally 05/04/1948)   TETANUS/TDAP  11/26/2029   Pneumonia Vaccine 36+ Years old  Completed   INFLUENZA VACCINE  Completed   DEXA SCAN  Completed   HPV VACCINES  Aged Out    Health Maintenance  Health Maintenance Due  Topic Date Due   COVID-19 Vaccine (3 - Moderna risk series) 11/25/2020    Colorectal cancer screening: No longer required.   Mammogram status: No longer required due to aged out.  Bone Density status: Completed 2022. Results reflect: Bone density results: OSTEOPOROSIS. Repeat every 2-3 years.  Lung Cancer Screening: (Low Dose CT Chest recommended if Age 50-80 years, 30 pack-year currently smoking OR have quit w/in 15years.) does not qualify.   Lung Cancer Screening Referral: no  Additional Screening:  Hepatitis C Screening: does not qualify  Vision Screening: Recommended annual ophthalmology exams for early detection of glaucoma and other disorders of the eye. Is the patient up to date with their annual eye exam?  No  Who is the provider or what is the name of the office in which the patient attends annual eye exams? Will refer if desires.  If pt is not established with a provider, would they like to be referred to a provider to establish care? No .   Dental Screening:  Recommended annual dental exams for proper oral hygiene  Community Resource Referral / Chronic Care Management: CRR required this visit?  No   CCM required this visit?  No      Plan:    Recommend COVID 3, Shingrix. Eye Exam I have personally reviewed and noted the following in the patients chart:   Medical and social history Use of alcohol, tobacco or illicit drugs  Current medications and supplements including opioid prescriptions.  Functional ability and status Nutritional status Physical activity Advanced directives List of other physicians Hospitalizations, surgeries, and ER visits in previous 12 months Vitals Screenings to include cognitive, depression, and falls Referrals and appointments  In addition, I have reviewed and discussed with patient certain preventive protocols, quality metrics, and best practice recommendations. A written personalized care plan for preventive services as well as general preventive health recommendations were provided to patient.     Alucard Fearnow X Devonia Farro, NP   08/13/2021

## 2021-08-21 DIAGNOSIS — R351 Nocturia: Secondary | ICD-10-CM | POA: Diagnosis not present

## 2021-08-21 DIAGNOSIS — R3915 Urgency of urination: Secondary | ICD-10-CM | POA: Diagnosis not present

## 2021-08-21 DIAGNOSIS — N3941 Urge incontinence: Secondary | ICD-10-CM | POA: Diagnosis not present

## 2021-08-21 DIAGNOSIS — R8271 Bacteriuria: Secondary | ICD-10-CM | POA: Diagnosis not present

## 2021-08-26 DIAGNOSIS — N184 Chronic kidney disease, stage 4 (severe): Secondary | ICD-10-CM | POA: Diagnosis not present

## 2021-08-26 DIAGNOSIS — D649 Anemia, unspecified: Secondary | ICD-10-CM | POA: Diagnosis not present

## 2021-08-27 LAB — IRON AND TIBC
Iron Saturation: 41 % (ref 15–55)
Iron: 112 ug/dL (ref 27–139)
Total Iron Binding Capacity: 273 ug/dL (ref 250–450)
UIBC: 161 ug/dL (ref 118–369)

## 2021-09-02 DIAGNOSIS — R809 Proteinuria, unspecified: Secondary | ICD-10-CM | POA: Diagnosis not present

## 2021-09-02 DIAGNOSIS — N1832 Chronic kidney disease, stage 3b: Secondary | ICD-10-CM | POA: Diagnosis not present

## 2021-09-02 DIAGNOSIS — I129 Hypertensive chronic kidney disease with stage 1 through stage 4 chronic kidney disease, or unspecified chronic kidney disease: Secondary | ICD-10-CM | POA: Diagnosis not present

## 2021-09-02 DIAGNOSIS — D631 Anemia in chronic kidney disease: Secondary | ICD-10-CM | POA: Diagnosis not present

## 2021-09-04 ENCOUNTER — Other Ambulatory Visit: Payer: Self-pay | Admitting: *Deleted

## 2021-09-04 MED ORDER — PANTOPRAZOLE SODIUM 40 MG PO TBEC: 40.0000 mg | DELAYED_RELEASE_TABLET | Freq: Every day | ORAL | 1 refills | Status: DC

## 2021-09-04 NOTE — Telephone Encounter (Signed)
Patient daughter requested refill.  ?Confirmed pharmacy.  ?

## 2021-09-10 ENCOUNTER — Encounter: Payer: Self-pay | Admitting: Nurse Practitioner

## 2021-09-10 ENCOUNTER — Non-Acute Institutional Stay: Payer: Medicare Other | Admitting: Nurse Practitioner

## 2021-09-10 DIAGNOSIS — N1831 Chronic kidney disease, stage 3a: Secondary | ICD-10-CM

## 2021-09-10 DIAGNOSIS — M8000XD Age-related osteoporosis with current pathological fracture, unspecified site, subsequent encounter for fracture with routine healing: Secondary | ICD-10-CM | POA: Diagnosis not present

## 2021-09-10 DIAGNOSIS — L853 Xerosis cutis: Secondary | ICD-10-CM

## 2021-09-10 DIAGNOSIS — I872 Venous insufficiency (chronic) (peripheral): Secondary | ICD-10-CM | POA: Diagnosis not present

## 2021-09-10 DIAGNOSIS — D649 Anemia, unspecified: Secondary | ICD-10-CM | POA: Diagnosis not present

## 2021-09-10 DIAGNOSIS — I10 Essential (primary) hypertension: Secondary | ICD-10-CM

## 2021-09-10 DIAGNOSIS — N3942 Incontinence without sensory awareness: Secondary | ICD-10-CM | POA: Diagnosis not present

## 2021-09-10 NOTE — Assessment & Plan Note (Signed)
Her urinary frequency/leakage, uses pads, it seems better since taking Myrbetriq in pm per Urology ?

## 2021-09-10 NOTE — Assessment & Plan Note (Addendum)
Hgb 9.3 05/12/21>>8.6 314/23, on Pantoprazole for GI protection, FOBT negative x3, Iron 112 08/26/21, normal Vit B12, Folate level in the past., On Fe. ?Repeat CBC/diff, Vit B12, EPO level.  ?

## 2021-09-10 NOTE — Patient Instructions (Addendum)
Labs in one week, f//u in clinic 2 weeks.  ?

## 2021-09-10 NOTE — Assessment & Plan Note (Signed)
prn Hydrocortisone cream, skin moisturizer ?

## 2021-09-10 NOTE — Assessment & Plan Note (Signed)
blood pressure is controlled, on Valsartan, Amlodipine 5mg  qd started 07/07/21 by Nephrology.  ?

## 2021-09-10 NOTE — Assessment & Plan Note (Signed)
chronic, on Furosemide, reduced by Nephrology 07/07/21 to 3x/wk. 07/2021 venous US LLE negative DVT ?

## 2021-09-10 NOTE — Progress Notes (Signed)
? ?Location:   clinic [facility] ?  ?Place of Service:  Clinic (12) ?Provider: Marlana Latus NP ? ?Code Status: DNR ?Goals of Care: [place] ? ?  08/13/2021  ?  2:20 PM  ?Advanced Directives  ?Does Patient Have a Medical Advance Directive? Yes  ?Type of [employer] [place];Living will;Out of facility DNR (pink MOST or yellow form)  ?Does patient want to make changes to medical advance directive? No - Patient declined  ?Copy of [name] in Chart? Yes - validated most recent copy scanned in chart (See row information)  ?Pre-existing out of facility DNR order (yellow form or pink MOST form) Pink Most/Yellow Form available - Physician notified to receive inpatient order  ? ? ? ?Chief Complaint  ?Patient presents with  ? Medical Management of Chronic Issues  ?  Patient presents today for a 6 weeks follow-up.  ? ? ?HPI: Patient is a 88 y.o. female seen today for medical management of chronic diseases.   ?  UTI 07/07/21 identified, treated by Nephrology, desires suppression therapy.  ?             LLE, slow healing wound, applying Nystatin/Hydrocortisone presently.  ?Anemia, Hgb 9.3 05/12/21>>8.6 08/25/21, on Pantoprazole for GI protection, FOBT negative x3, Iron 112 08/26/21, normal Vit B12, Folate level in the past., On Fe ?Edema BLE, chronic, on Furosemide, reduced by Nephrology 07/07/21 to 3x/wk. 07/2021 venous US LLE negative DVT ?CKD Bun/creat 33/1.7011/29/22, saw nephrology ?OA, Hx of R ankle fx, ambulates with walker.  ?            Her urinary frequency/leakage, uses pads, it seems better since taking Myrbetriq in pm per Urology ?            HTN, blood pressure is controlled, on Valsartan, Amlodipine 5mg  qd started 07/07/21 by Nephrology.  ?            No constipation, taking Fibercon prn.  ?             Osteoporosis, off Alendronate due to CKD, 11/05/20 DEXA t score -2.8 ?            Fecal incontinence prn Imodium ?            Itching dry skin, prn Hydrocortisone cream, skin  moisturizer ? ?Past Medical History:  ?Diagnosis Date  ? Anemia   ? Chronic kidney disease   ? stage 3  ? GERD (gastroesophageal reflux disease)   ? Hypertension   ? ? ?Past Surgical History:  ?Procedure Laterality Date  ? ORIF ANKLE FRACTURE Right 01/13/2021  ? Procedure: OPEN TREATMENT OF RIGHT TRIMALLEOLAR ANKLE FRACTURE WITHOUT POSTERIOR FIXATION, POSSIBLE SYNDESMOSIS;  Surgeon: Erle Crocker, MD;  Location: [facility];  Service: Orthopedics;  Laterality: Right;  ? ? ?Allergies  ?Allergen Reactions  ? Ace Inhibitors   ? Flagyl [Metronidazole]   ? Penicillins Swelling  ?  facial  ? ? ?Allergies as of 09/10/2021   ? ?   Reactions  ? Ace Inhibitors   ? Flagyl [metronidazole]   ? Penicillins Swelling  ? facial  ? ?  ? ?  ?Medication List  ?  ? ?  ? Accurate as of September 10, 2021  2:30 PM. If you have any questions, ask your nurse or doctor.  ?  ?  ? ?  ? ?STOP taking these medications   ? ?acetaminophen 325 MG tablet ?Commonly known as: TYLENOL ?Stopped by: Merril Isakson X Layani Foronda, NP ?  ?  Calcium Carb-Cholecalciferol 500-400 MG-UNIT Tabs ?Stopped by: Clea Dubach X Jamien Casanova, NP ?  ?polycarbophil 625 MG tablet ?Commonly known as: FIBERCON ?Stopped by: Kariss Longmire X Chrisie Jankovich, NP ?  ? ?  ? ?TAKE these medications   ? ?amLODipine 5 MG tablet ?Commonly known as: NORVASC ?Take 5 mg by mouth daily. ?  ?cholecalciferol 25 MCG (1000 UNIT) tablet ?Commonly known as: VITAMIN D ?Take 2 tablets (2,000 Units total) by mouth daily. ?  ?ferrous sulfate 325 (65 FE) MG tablet ?Take 1 tablet (325 mg total) by mouth daily with breakfast. MON, WED, FRI ?  ?furosemide 20 MG tablet ?Commonly known as: LASIX ?Take 1 tablet (20 mg total) by mouth daily. ?What changed: when to take this ?  ?hydrocortisone 2.5%-nystatin-zinc oxide 20% 1:1:1 ointment mixture ?Apply topically daily. ?  ?ICAPS AREDS 2 PO ?Take by mouth 2 (two) times daily. ?  ?mirabegron ER 25 MG Tb24 tablet ?Commonly known as: MYRBETRIQ ?Take 2 tablets (50 mg total) by mouth daily. ?  ?pantoprazole 40 MG  tablet ?Commonly known as: PROTONIX ?Take 1 tablet (40 mg total) by mouth daily. ?  ?valsartan 160 MG tablet ?Commonly known as: DIOVAN ?Take 1 tablet (160 mg total) by mouth daily. ?  ?vitamin C 250 MG tablet ?Commonly known as: ASCORBIC ACID ?Take 1 tablet (250 mg total) by mouth daily. ?  ? ?  ? ? ?Review of Systems:  ?Review of Systems  ?Constitutional:  Positive for unexpected weight change. Negative for fatigue and fever.  ?     Weight gained about #2-3 Ibs   ?HENT:  Positive for hearing loss. Negative for congestion and voice change.   ?Eyes:  Negative for visual disturbance.  ?Respiratory:  Negative for shortness of breath.   ?Cardiovascular:  Positive for leg swelling.  ?Gastrointestinal:  Negative for abdominal pain and constipation.  ?     Fecal incontinent.   ?Genitourinary:  Positive for frequency. Negative for dysuria and urgency.  ?     Occasionally incontinent of urine. Urination average every 4 hrs during day 1x/night.   ?Musculoskeletal:  Positive for arthralgias and gait problem.  ?     Right lower leg/ankle brace  ?Skin:  Negative for color change.  ?     Left calf skin tear is healed.   ?Neurological:  Negative for speech difficulty, weakness and light-headedness.  ?     Memory lapses.   ?Psychiatric/Behavioral:  Negative for behavioral problems and sleep disturbance. The patient is not nervous/anxious.   ? ?Health Maintenance  ?Topic Date Due  ? COVID-19 Vaccine (3 - Moderna risk series) 11/25/2020  ? Zoster Vaccines- Shingrix (1 of 2) 06/14/2022 (Originally 05/04/1948)  ? TETANUS/TDAP  11/26/2029  ? Pneumonia Vaccine 60+ Years old  Completed  ? INFLUENZA VACCINE  Completed  ? DEXA SCAN  Completed  ? HPV VACCINES  Aged Out  ? ? ?Physical Exam: ?Vitals:  ? 09/10/21 1400  ?Weight: 119 lb (54 kg)  ?Height: 5' 4.5" (1.638 m)  ? ?Body mass index is 20.11 kg/m?Marland Kitchen ?Physical Exam ?Vitals reviewed.  ?Constitutional:   ?   Appearance: Normal appearance.  ?HENT:  ?   Head: Normocephalic and atraumatic.  ?    Nose: Nose normal.  ?   Mouth/Throat:  ?   Mouth: Mucous membranes are moist.  ?Eyes:  ?   Extraocular Movements: Extraocular movements intact.  ?   Conjunctiva/sclera: Conjunctivae normal.  ?   Pupils: Pupils are equal, round, and reactive to light.  ?Cardiovascular:  ?  Rate and Rhythm: Normal rate and regular rhythm.  ?   Heart sounds: No murmur heard. ?Pulmonary:  ?   Breath sounds: No rales.  ?Abdominal:  ?   General: Bowel sounds are normal.  ?   Palpations: Abdomen is soft.  ?   Tenderness: There is no abdominal tenderness.  ?Musculoskeletal:     ?   General: No tenderness.  ?   Cervical back: Normal range of motion and neck supple.  ?   Right lower leg: Edema present.  ?   Left lower leg: Edema present.  ?   Comments: Right ankle in brace, s/p ORIF 01/13/21. Edema BLE 1+  ?Skin: ?   General: Skin is warm and dry.  ?   Comments: Chronic pigmented venous insufficiency skin changes BLE. Left calf skin tear is healed, residual mild redness.   ?Neurological:  ?   Mental Status: She is alert. Mental status is at baseline.  ?   Gait: Gait abnormal.  ?   Comments: Oriented to person, place.   ?Psychiatric:     ?   Mood and Affect: Mood normal.     ?   Behavior: Behavior normal.     ?   Thought Content: Thought content normal.  ? ? ?Labs reviewed: ?Basic Metabolic Panel: ?Recent Labs  ?  11/20/20 ?0730 12/08/20 ?0828 12/20/20 ?0000 01/13/21 ?0741 03/03/21 ?0000 05/12/21 ?0715  ?NA 138 136   < > 135 138 138  ?K 4.2 4.6   < > 3.9 4.4 4.3  ?CL 102 102   < > 100 104 104  ?CO2 26 22   < > 26 28* 26  ?GLUCOSE 78 79  --  85  --  86  ?BUN 44* 42*   < > 23 31* 33*  ?CREATININE 1.89* 1.92*   < > 1.57* 1.6* 1.70*  ?CALCIUM 10.4 10.3   < > 9.4 9.5 9.5  ?TSH 2.48  --   --   --   --   --   ? < > = values in this interval not displayed.  ? ?Liver Function Tests: ?Recent Labs  ?  12/08/20 ?0828 12/20/20 ?0000 12/23/20 ?0000 01/13/21 ?0741 05/12/21 ?0715  ?AST 21 20 26 22 19   ?ALT 12 15 13 17 12   ?ALKPHOS  --  88 32 54  --   ?BILITOT  0.4  --   --  0.7 0.4  ?PROT 6.7  --   --  6.0* 6.5  ?ALBUMIN  --  4.2 4.2 3.3*  --   ? ?No results for input(s): LIPASE, AMYLASE in the last 8760 hours. ?No results for input(s): AMMONIA in the last 8760 hours. ?CBC: ?Recent

## 2021-09-10 NOTE — Assessment & Plan Note (Signed)
Bun/creat 33/1.7011/29/22, saw nephrology ?

## 2021-09-10 NOTE — Assessment & Plan Note (Signed)
off Alendronate due to CKD, 11/05/20 DEXA t score -2.8 ?

## 2021-09-11 ENCOUNTER — Encounter: Payer: Self-pay | Admitting: Nurse Practitioner

## 2021-09-15 ENCOUNTER — Other Ambulatory Visit: Payer: Medicare Other

## 2021-09-15 DIAGNOSIS — D649 Anemia, unspecified: Secondary | ICD-10-CM

## 2021-09-16 LAB — CBC WITH DIFFERENTIAL/PLATELET
Absolute Monocytes: 482 cells/uL (ref 200–950)
Basophils Absolute: 62 cells/uL (ref 0–200)
Basophils Relative: 1.1 %
Eosinophils Absolute: 403 cells/uL (ref 15–500)
Eosinophils Relative: 7.2 %
HCT: 29.2 % — ABNORMAL LOW (ref 35.0–45.0)
Hemoglobin: 9.4 g/dL — ABNORMAL LOW (ref 11.7–15.5)
Lymphs Abs: 1725 cells/uL (ref 850–3900)
MCH: 30.7 pg (ref 27.0–33.0)
MCHC: 32.2 g/dL (ref 32.0–36.0)
MCV: 95.4 fL (ref 80.0–100.0)
MPV: 10.9 fL (ref 7.5–12.5)
Monocytes Relative: 8.6 %
Neutro Abs: 2929 cells/uL (ref 1500–7800)
Neutrophils Relative %: 52.3 %
Platelets: 230 10*3/uL (ref 140–400)
RBC: 3.06 10*6/uL — ABNORMAL LOW (ref 3.80–5.10)
RDW: 12.2 % (ref 11.0–15.0)
Total Lymphocyte: 30.8 %
WBC: 5.6 10*3/uL (ref 3.8–10.8)

## 2021-09-16 LAB — VITAMIN B12: Vitamin B-12: 480 pg/mL (ref 200–1100)

## 2021-09-16 LAB — ERYTHROPOIETIN: Erythropoietin: 9.9 m[IU]/mL (ref 2.6–18.5)

## 2021-09-23 ENCOUNTER — Encounter: Payer: Self-pay | Admitting: Nurse Practitioner

## 2021-09-24 ENCOUNTER — Encounter: Payer: Self-pay | Admitting: Nurse Practitioner

## 2021-09-24 ENCOUNTER — Non-Acute Institutional Stay: Payer: Medicare Other | Admitting: Nurse Practitioner

## 2021-09-24 DIAGNOSIS — M8000XD Age-related osteoporosis with current pathological fracture, unspecified site, subsequent encounter for fracture with routine healing: Secondary | ICD-10-CM

## 2021-09-24 DIAGNOSIS — D649 Anemia, unspecified: Secondary | ICD-10-CM

## 2021-09-24 DIAGNOSIS — N1831 Chronic kidney disease, stage 3a: Secondary | ICD-10-CM | POA: Diagnosis not present

## 2021-09-24 DIAGNOSIS — I10 Essential (primary) hypertension: Secondary | ICD-10-CM | POA: Diagnosis not present

## 2021-09-24 DIAGNOSIS — I872 Venous insufficiency (chronic) (peripheral): Secondary | ICD-10-CM | POA: Diagnosis not present

## 2021-09-24 DIAGNOSIS — M159 Polyosteoarthritis, unspecified: Secondary | ICD-10-CM

## 2021-09-24 DIAGNOSIS — R159 Full incontinence of feces: Secondary | ICD-10-CM

## 2021-09-24 DIAGNOSIS — N3942 Incontinence without sensory awareness: Secondary | ICD-10-CM

## 2021-09-24 DIAGNOSIS — K5901 Slow transit constipation: Secondary | ICD-10-CM

## 2021-09-24 NOTE — Assessment & Plan Note (Signed)
taking Fibercon prn.  ?

## 2021-09-24 NOTE — Assessment & Plan Note (Signed)
off Alendronate due to CKD, 11/05/20 DEXA t score -2.8 ?

## 2021-09-24 NOTE — Assessment & Plan Note (Signed)
Edema BLE, chronic, on Furosemide, reduced by Nephrology 07/07/21 to 3x/wk. 07/2021 venous US LLE negative DVT ?

## 2021-09-24 NOTE — Assessment & Plan Note (Signed)
blood pressure is controlled, on Valsartan, Amlodipine 5mg  qd started 07/07/21 by Nephrology.  ?

## 2021-09-24 NOTE — Assessment & Plan Note (Signed)
Bun/creat 42/1.92 08/27/21,  saw nephrology ?

## 2021-09-24 NOTE — Assessment & Plan Note (Signed)
urinary frequency/leakage, uses pads, it seems better since taking Myrbetriq in pm per Urology ?

## 2021-09-24 NOTE — Assessment & Plan Note (Signed)
Hx of R ankle fx, ambulates with walker.  ?

## 2021-09-24 NOTE — Assessment & Plan Note (Signed)
Occasional, uses imodium sometimes.  ?

## 2021-09-24 NOTE — Assessment & Plan Note (Signed)
Hgb 9.4 09/15/21, at her baseline,  on Pantoprazole for GI protection, FOBT negative x3, Iron 112 08/26/21, Vit B12 480, EPO 9.9 09/15/21, On Fe ?

## 2021-09-24 NOTE — Progress Notes (Signed)
? ?Location:   clinic [facility] ?  ?Place of Service:  Clinic (12) ?Provider: Marlana Latus NP ? ?Code Status: DNR ?Go[place] of Care: [place] ? ?  09/23/2021  ?  3:04 PM  ?Advanced Directives  ?Does Patient Have a Medical Advance Directive? Yes  ?Type of Advance Directive Out of facility DNR (pink MOST or yellow form)  ?Does patient want to make changes to medical advance directive? No - Patient declined  ? ? ? ?Chief Complaint  ?Patient presents with  ? Medical Management of Chronic Issues  ?  Patient presents today for a 2 weeks follow up.  ? ? ?HPI: Patient is a 88 y.o. female seen today for medical management of chronic diseases.   ?  ?Anemia, Hgb 9.4 09/15/21, at her baseline,  on Pantoprazole for GI protection, FOBT negative x3, Iron 112 08/26/21, Vit B12 480, EPO 9.9 09/15/21, On Fe ?Edema BLE, chronic, on Furosemide, reduced by Nephrology 07/07/21 to 3x/wk. 07/2021 venous US LLE negative DVT ?CKD Bun/creat 42/1.92 08/27/21,  saw nephrology ?OA, Hx of R ankle fx, ambulates with walker.  ?            Her urinary frequency/leakage, uses pads, it seems better since taking Myrbetriq in pm per Urology ?            HTN, blood pressure is controlled, on Valsartan, Amlodipine 5mg  qd started 07/07/21 by Nephrology.  ?            No constipation, taking Fibercon prn.  ?             Osteoporosis, off Alendronate due to CKD, 11/05/20 DEXA t score -2.8 ?            Fecal incontinence prn Imodium ?             ? ?Past Medical History:  ?Diagnosis Date  ? Anemia   ? Chronic kidney disease   ? stage 3  ? GERD (gastroesophageal reflux disease)   ? Hypertension   ? ? ?Past Surgical History:  ?Procedure Laterality Date  ? ORIF ANKLE FRACTURE Right 01/13/2021  ? Procedure: OPEN TREATMENT OF RIGHT TRIMALLEOLAR ANKLE FRACTURE WITHOUT POSTERIOR FIXATION, POSSIBLE SYNDESMOSIS;  Surgeon: Erle Crocker, MD;  Location: [facility];  Service: Orthopedics;  Laterality: Right;  ? ? ?Allergies  ?Allergen Reactions  ? Ace Inhibitors   ? Flagyl [Metronidazole]   ?  Penicillins Swelling  ?  facial  ? ? ?Allergies as of 09/24/2021   ? ?   Reactions  ? Ace Inhibitors   ? Flagyl [metronidazole]   ? Penicillins Swelling  ? facial  ? ?  ? ?  ?Medication List  ?  ? ?  ? Accurate as of September 24, 2021 11:59 PM. If you have any questions, ask your nurse or doctor.  ?  ?  ? ?  ? ?amLODipine 5 MG tablet ?Commonly known as: NORVASC ?Take 5 mg by mouth daily. ?  ?cholecalciferol 25 MCG (1000 UNIT) tablet ?Commonly known as: VITAMIN D ?Take 2 tablets (2,000 Units total) by mouth daily. ?  ?ferrous sulfate 325 (65 FE) MG tablet ?Take 1 tablet (325 mg total) by mouth daily with breakfast. MON, WED, FRI ?  ?furosemide 20 MG tablet ?Commonly known as: LASIX ?Take 1 tablet (20 mg total) by mouth daily. ?What changed: when to take this ?  ?hydrocortisone 2.5%-nystatin-zinc oxide 20% 1:1:1 ointment mixture ?Apply topically daily. ?  ?ICAPS AREDS 2 PO ?Take by mouth 2 (two)  times daily. ?  ?mirabegron ER 25 MG Tb24 tablet ?Commonly known as: MYRBETRIQ ?Take 2 tablets (50 mg total) by mouth daily. ?  ?pantoprazole 40 MG tablet ?Commonly known as: PROTONIX ?Take 1 tablet (40 mg total) by mouth daily. ?  ?valsartan 160 MG tablet ?Commonly known as: DIOVAN ?Take 1 tablet (160 mg total) by mouth daily. ?  ?vitamin C 250 MG tablet ?Commonly known as: ASCORBIC ACID ?Take 1 tablet (250 mg total) by mouth daily. ?  ? ?  ? ? ?Review of Systems:  ?Review of Systems  ?Constitutional:  Negative for fatigue, fever and unexpected weight change.  ?     Weight gained about #2-3 Ibs   ?HENT:  Positive for hearing loss. Negative for congestion and voice change.   ?Eyes:  Negative for visual disturbance.  ?Respiratory:  Negative for shortness of breath.   ?Cardiovascular:  Positive for leg swelling.  ?Gastrointestinal:  Negative for abdominal pain and constipation.  ?     Fecal incontinent.   ?Genitourinary:  Positive for frequency. Negative for dysuria and urgency.  ?     Occasionally incontinent of urine. Urination  average every 4 hrs during day 1x/night.   ?Musculoskeletal:  Positive for arthralgias and gait problem.  ?     Right lower leg/ankle brace  ?Skin:  Negative for color change.  ?     Left calf skin tear is healed.   ?Neurological:  Negative for speech difficulty, weakness and light-headedness.  ?     Memory lapses.   ?Psychiatric/Behavioral:  Negative for behavioral problems and sleep disturbance. The patient is not nervous/anxious.   ? ?Health Maintenance  ?Topic Date Due  ? COVID-19 Vaccine (3 - Moderna risk series) 11/25/2020  ? Zoster Vaccines- Shingrix (1 of 2) 06/14/2022 (Originally 05/04/1948)  ? INFLUENZA VACCINE  01/12/2022  ? TETANUS/TDAP  11/26/2029  ? Pneumonia Vaccine 47+ Years old  Completed  ? DEXA SCAN  Completed  ? HPV VACCINES  Aged Out  ? ? ?Physical Exam: ?Vitals:  ? 09/24/21 1333  ?BP: 118/70  ?Pulse: 91  ?Temp: (!) 96.7 ?F (35.9 ?C)  ?SpO2: 96%  ?Weight: 120 lb 6.4 oz (54.6 kg)  ?Height: 5' 4.5" (1.638 m)  ? ?Body mass index is 20.35 kg/m?Marland Kitchen ?Physical Exam ?Vitals reviewed.  ?Constitutional:   ?   Appearance: Normal appearance.  ?HENT:  ?   Head: Normocephalic and atraumatic.  ?   Nose: Nose normal.  ?   Mouth/Throat:  ?   Mouth: Mucous membranes are moist.  ?Eyes:  ?   Extraocular Movements: Extraocular movements intact.  ?   Conjunctiva/sclera: Conjunctivae normal.  ?   Pupils: Pupils are equal, round, and reactive to light.  ?Cardiovascular:  ?   Rate and Rhythm: Normal rate and regular rhythm.  ?   Heart sounds: No murmur heard. ?Pulmonary:  ?   Breath sounds: No rales.  ?Abdominal:  ?   General: Bowel sounds are normal.  ?   Palpations: Abdomen is soft.  ?   Tenderness: There is no abdominal tenderness.  ?Musculoskeletal:     ?   General: No tenderness.  ?   Cervical back: Normal range of motion and neck supple.  ?   Right lower leg: Edema present.  ?   Left lower leg: Edema present.  ?   Comments: R ankle s/p ORIF 01/13/21. Edema BLE trace  ?Skin: ?   General: Skin is warm and dry.  ?    Comments: Chronic  pigmented venous insufficiency skin changes BLE. R lateral shin AK sized about a quarter, pending dermatology eval.   ?Neurological:  ?   Mental Status: She is alert. Mental status is at baseline.  ?   Gait: Gait abnormal.  ?   Comments: Oriented to person, place.   ?Psychiatric:     ?   Mood and Affect: Mood normal.     ?   Behavior: Behavior normal.     ?   Thought Content: Thought content normal.  ? ? ?Labs reviewed: ?Basic Metabolic Panel: ?Recent Labs  ?  11/20/20 ?0730 12/08/20 ?0828 12/20/20 ?0000 01/13/21 ?0741 03/03/21 ?0000 05/12/21 ?0715  ?NA 138 136   < > 135 138 138  ?K 4.2 4.6   < > 3.9 4.4 4.3  ?CL 102 102   < > 100 104 104  ?CO2 26 22   < > 26 28* 26  ?GLUCOSE 78 79  --  85  --  86  ?BUN 44* 42*   < > 23 31* 33*  ?CREATININE 1.89* 1.92*   < > 1.57* 1.6* 1.70*  ?CALCIUM 10.4 10.3   < > 9.4 9.5 9.5  ?TSH 2.48  --   --   --   --   --   ? < > = values in this interval not displayed.  ? ?Liver Function Tests: ?Recent Labs  ?  12/08/20 ?0828 12/20/20 ?0000 12/23/20 ?0000 01/13/21 ?0741 05/12/21 ?0715  ?AST 21 20 26 22 19   ?ALT 12 15 13 17 12   ?ALKPHOS  --  21 19 41  --   ?BILITOT 0.4  --   --  0.7 0.4  ?PROT 6.7  --   --  6.0* 6.5  ?ALBUMIN  --  4.2 4.2 3.3*  --   ? ?No results for input(s): LIPASE, AMYLASE in the last 8760 hours. ?No results for input(s): AMMONIA in the last 8760 hours. ?CBC: ?Recent Labs  ?  01/13/21 ?0741 01/22/21 ?0000 02/12/21 ?0000 05/12/21 ?0715 09/15/21 ?0725  ?WBC 5.9   < > 5.7 5.9 5.6  ?NEUTROABS  --    < > 3,392.00 3,292 2,929  ?HGB 8.0*   < > 8.1* 9.3* 9.4*  ?HCT 25.5*   < > 25* 28.2* 29.2*  ?MCV 100.8*  --   --  95.6 95.4  ?PLT 241   < > 217 246 230  ? < > = values in this interval not displayed.  ? ?Lipid Panel: ?No results for input(s): CHOL, HDL, LDLCALC, TRIG, CHOLHDL, LDLDIRECT in the last 8760 hours. ?No results found for: HGBA1C ? ?Procedures since last visit: ?No results found. ? ?Assessment/Plan ? ?Anemia ?Hgb 9.4 09/15/21, at her baseline,  on  Pantoprazole for GI protection, FOBT negative x3, Iron 112 08/26/21, Vit B12 480, EPO 9.9 09/15/21, On Fe ? ?Venous insufficiency (chronic) (peripheral) ?Edema BLE, chronic, on Furosemide, reduced by Nephrology 1/24/

## 2021-09-25 ENCOUNTER — Encounter: Payer: Self-pay | Admitting: Nurse Practitioner

## 2021-09-28 DIAGNOSIS — D485 Neoplasm of uncertain behavior of skin: Secondary | ICD-10-CM | POA: Diagnosis not present

## 2021-09-28 DIAGNOSIS — C44722 Squamous cell carcinoma of skin of right lower limb, including hip: Secondary | ICD-10-CM | POA: Diagnosis not present

## 2021-09-28 DIAGNOSIS — D692 Other nonthrombocytopenic purpura: Secondary | ICD-10-CM | POA: Diagnosis not present

## 2021-09-28 DIAGNOSIS — L858 Other specified epidermal thickening: Secondary | ICD-10-CM | POA: Diagnosis not present

## 2021-09-28 DIAGNOSIS — Z85068 Personal history of other malignant neoplasm of small intestine: Secondary | ICD-10-CM | POA: Diagnosis not present

## 2021-09-30 ENCOUNTER — Other Ambulatory Visit: Payer: Self-pay | Admitting: Nurse Practitioner

## 2021-10-20 DIAGNOSIS — L858 Other specified epidermal thickening: Secondary | ICD-10-CM | POA: Diagnosis not present

## 2021-10-20 DIAGNOSIS — C44722 Squamous cell carcinoma of skin of right lower limb, including hip: Secondary | ICD-10-CM | POA: Diagnosis not present

## 2021-10-20 DIAGNOSIS — D692 Other nonthrombocytopenic purpura: Secondary | ICD-10-CM | POA: Diagnosis not present

## 2021-10-26 ENCOUNTER — Other Ambulatory Visit: Payer: Self-pay | Admitting: *Deleted

## 2021-10-26 MED ORDER — MIRABEGRON ER 25 MG PO TB24: ORAL_TABLET | ORAL | 1 refills | Status: DC

## 2021-10-26 NOTE — Telephone Encounter (Signed)
[facility] requested refill.  ? ?

## 2021-10-28 ENCOUNTER — Other Ambulatory Visit: Payer: Self-pay | Admitting: Nurse Practitioner

## 2021-10-28 DIAGNOSIS — I10 Essential (primary) hypertension: Secondary | ICD-10-CM

## 2021-11-16 ENCOUNTER — Other Ambulatory Visit: Payer: Self-pay | Admitting: Nurse Practitioner

## 2021-11-30 ENCOUNTER — Other Ambulatory Visit: Payer: Self-pay

## 2021-11-30 ENCOUNTER — Encounter (HOSPITAL_COMMUNITY): Payer: Self-pay

## 2021-11-30 ENCOUNTER — Emergency Department (HOSPITAL_COMMUNITY): Payer: Medicare Other

## 2021-11-30 ENCOUNTER — Inpatient Hospital Stay (HOSPITAL_COMMUNITY): Payer: Medicare Other | Admitting: Anesthesiology

## 2021-11-30 ENCOUNTER — Inpatient Hospital Stay (HOSPITAL_COMMUNITY)
Admission: EM | Admit: 2021-11-30 | Discharge: 2021-12-04 | DRG: 521 | Disposition: A | Discharge status: Skilled Nursing Facility | Payer: Medicare Other | Source: Skilled Nursing Facility | Attending: Family Medicine | Admitting: Family Medicine

## 2021-11-30 DIAGNOSIS — Z681 Body mass index (BMI) 19 or less, adult: Secondary | ICD-10-CM

## 2021-11-30 DIAGNOSIS — R531 Weakness: Secondary | ICD-10-CM | POA: Diagnosis not present

## 2021-11-30 DIAGNOSIS — Z87891 Personal history of nicotine dependence: Secondary | ICD-10-CM | POA: Diagnosis not present

## 2021-11-30 DIAGNOSIS — S72001D Fracture of unspecified part of neck of right femur, subsequent encounter for closed fracture with routine healing: Secondary | ICD-10-CM | POA: Diagnosis not present

## 2021-11-30 DIAGNOSIS — D631 Anemia in chronic kidney disease: Secondary | ICD-10-CM | POA: Diagnosis present

## 2021-11-30 DIAGNOSIS — E785 Hyperlipidemia, unspecified: Secondary | ICD-10-CM | POA: Diagnosis present

## 2021-11-30 DIAGNOSIS — Z79899 Other long term (current) drug therapy: Secondary | ICD-10-CM | POA: Diagnosis not present

## 2021-11-30 DIAGNOSIS — M25551 Pain in right hip: Secondary | ICD-10-CM | POA: Diagnosis not present

## 2021-11-30 DIAGNOSIS — E8770 Fluid overload, unspecified: Secondary | ICD-10-CM | POA: Diagnosis not present

## 2021-11-30 DIAGNOSIS — R2689 Other abnormalities of gait and mobility: Secondary | ICD-10-CM | POA: Diagnosis not present

## 2021-11-30 DIAGNOSIS — K219 Gastro-esophageal reflux disease without esophagitis: Secondary | ICD-10-CM | POA: Diagnosis present

## 2021-11-30 DIAGNOSIS — S72001A Fracture of unspecified part of neck of right femur, initial encounter for closed fracture: Secondary | ICD-10-CM | POA: Diagnosis not present

## 2021-11-30 DIAGNOSIS — N289 Disorder of kidney and ureter, unspecified: Secondary | ICD-10-CM | POA: Diagnosis not present

## 2021-11-30 DIAGNOSIS — Z883 Allergy status to other anti-infective agents status: Secondary | ICD-10-CM | POA: Diagnosis not present

## 2021-11-30 DIAGNOSIS — M1611 Unilateral primary osteoarthritis, right hip: Secondary | ICD-10-CM | POA: Diagnosis not present

## 2021-11-30 DIAGNOSIS — S91111A Laceration without foreign body of right great toe without damage to nail, initial encounter: Secondary | ICD-10-CM | POA: Diagnosis present

## 2021-11-30 DIAGNOSIS — S72041A Displaced fracture of base of neck of right femur, initial encounter for closed fracture: Secondary | ICD-10-CM | POA: Diagnosis not present

## 2021-11-30 DIAGNOSIS — Z7401 Bed confinement status: Secondary | ICD-10-CM | POA: Diagnosis not present

## 2021-11-30 DIAGNOSIS — Z66 Do not resuscitate: Secondary | ICD-10-CM | POA: Diagnosis present

## 2021-11-30 DIAGNOSIS — M810 Age-related osteoporosis without current pathological fracture: Secondary | ICD-10-CM | POA: Diagnosis not present

## 2021-11-30 DIAGNOSIS — G8918 Other acute postprocedural pain: Secondary | ICD-10-CM | POA: Diagnosis not present

## 2021-11-30 DIAGNOSIS — I129 Hypertensive chronic kidney disease with stage 1 through stage 4 chronic kidney disease, or unspecified chronic kidney disease: Secondary | ICD-10-CM | POA: Diagnosis not present

## 2021-11-30 DIAGNOSIS — N3281 Overactive bladder: Secondary | ICD-10-CM | POA: Diagnosis not present

## 2021-11-30 DIAGNOSIS — Z8249 Family history of ischemic heart disease and other diseases of the circulatory system: Secondary | ICD-10-CM

## 2021-11-30 DIAGNOSIS — R600 Localized edema: Secondary | ICD-10-CM | POA: Diagnosis not present

## 2021-11-30 DIAGNOSIS — I1310 Hypertensive heart and chronic kidney disease without heart failure, with stage 1 through stage 4 chronic kidney disease, or unspecified chronic kidney disease: Secondary | ICD-10-CM | POA: Diagnosis present

## 2021-11-30 DIAGNOSIS — M25572 Pain in left ankle and joints of left foot: Secondary | ICD-10-CM | POA: Diagnosis not present

## 2021-11-30 DIAGNOSIS — K449 Diaphragmatic hernia without obstruction or gangrene: Secondary | ICD-10-CM | POA: Diagnosis not present

## 2021-11-30 DIAGNOSIS — E43 Unspecified severe protein-calorie malnutrition: Secondary | ICD-10-CM | POA: Diagnosis present

## 2021-11-30 DIAGNOSIS — S72009A Fracture of unspecified part of neck of unspecified femur, initial encounter for closed fracture: Secondary | ICD-10-CM | POA: Diagnosis not present

## 2021-11-30 DIAGNOSIS — E559 Vitamin D deficiency, unspecified: Secondary | ICD-10-CM | POA: Diagnosis not present

## 2021-11-30 DIAGNOSIS — I10 Essential (primary) hypertension: Secondary | ICD-10-CM | POA: Diagnosis present

## 2021-11-30 DIAGNOSIS — N184 Chronic kidney disease, stage 4 (severe): Secondary | ICD-10-CM | POA: Diagnosis present

## 2021-11-30 DIAGNOSIS — R0902 Hypoxemia: Secondary | ICD-10-CM | POA: Diagnosis not present

## 2021-11-30 DIAGNOSIS — Z96641 Presence of right artificial hip joint: Secondary | ICD-10-CM | POA: Diagnosis not present

## 2021-11-30 DIAGNOSIS — Z888 Allergy status to other drugs, medicaments and biological substances status: Secondary | ICD-10-CM

## 2021-11-30 DIAGNOSIS — Z7982 Long term (current) use of aspirin: Secondary | ICD-10-CM | POA: Diagnosis not present

## 2021-11-30 DIAGNOSIS — D649 Anemia, unspecified: Secondary | ICD-10-CM | POA: Diagnosis not present

## 2021-11-30 DIAGNOSIS — Z880 Allergy status to penicillin: Secondary | ICD-10-CM | POA: Diagnosis not present

## 2021-11-30 DIAGNOSIS — W19XXXA Unspecified fall, initial encounter: Secondary | ICD-10-CM | POA: Diagnosis not present

## 2021-11-30 DIAGNOSIS — W19XXXD Unspecified fall, subsequent encounter: Secondary | ICD-10-CM | POA: Diagnosis not present

## 2021-11-30 LAB — BASIC METABOLIC PANEL
Anion gap: 11 (ref 5–15)
BUN: 29 mg/dL — ABNORMAL HIGH (ref 8–23)
CO2: 25 mmol/L (ref 22–32)
Calcium: 10.1 mg/dL (ref 8.9–10.3)
Chloride: 103 mmol/L (ref 98–111)
Creatinine, Ser: 1.65 mg/dL — ABNORMAL HIGH (ref 0.44–1.00)
GFR, Estimated: 29 mL/min — ABNORMAL LOW (ref >60)
Glucose, Bld: 93 mg/dL (ref 70–99)
Potassium: 4.1 mmol/L (ref 3.5–5.1)
Sodium: 139 mmol/L (ref 135–145)

## 2021-11-30 LAB — CBC WITH DIFFERENTIAL/PLATELET
Abs Immature Granulocytes: 0.02 10*3/uL (ref 0.00–0.07)
Basophils Absolute: 0.1 10*3/uL (ref 0.0–0.1)
Basophils Relative: 1 %
Eosinophils Absolute: 0.3 10*3/uL (ref 0.0–0.5)
Eosinophils Relative: 5 %
HCT: 32.7 % — ABNORMAL LOW (ref 36.0–46.0)
Hemoglobin: 10.5 g/dL — ABNORMAL LOW (ref 12.0–15.0)
Immature Granulocytes: 0 %
Lymphocytes Relative: 17 %
Lymphs Abs: 1.2 10*3/uL (ref 0.7–4.0)
MCH: 31.7 pg (ref 26.0–34.0)
MCHC: 32.1 g/dL (ref 30.0–36.0)
MCV: 98.8 fL (ref 80.0–100.0)
Monocytes Absolute: 0.4 10*3/uL (ref 0.1–1.0)
Monocytes Relative: 6 %
Neutro Abs: 5 10*3/uL (ref 1.7–7.7)
Neutrophils Relative %: 71 %
Platelets: 231 10*3/uL (ref 150–400)
RBC: 3.31 MIL/uL — ABNORMAL LOW (ref 3.87–5.11)
RDW: 13.1 % (ref 11.5–15.5)
WBC: 7 10*3/uL (ref 4.0–10.5)
nRBC: 0 % (ref 0.0–0.2)

## 2021-11-30 LAB — PROTIME-INR
INR: 1 (ref 0.8–1.2)
Prothrombin Time: 12.9 seconds (ref 11.4–15.2)

## 2021-11-30 LAB — TYPE AND SCREEN
ABO/RH(D): O POS
Antibody Screen: NEGATIVE

## 2021-11-30 MED ORDER — MORPHINE SULFATE (PF) 2 MG/ML IV SOLN
2.0000 mg | INTRAVENOUS | Status: DC | PRN
  Administered 2021-11-30 – 2021-12-01 (×3): 2 mg via INTRAVENOUS
  Filled 2021-11-30 (×3): qty 1

## 2021-11-30 MED ORDER — METHOCARBAMOL 500 MG PO TABS
500.0000 mg | ORAL_TABLET | Freq: Four times a day (QID) | ORAL | Status: DC | PRN
  Administered 2021-12-01: 500 mg via ORAL
  Filled 2021-11-30: qty 1

## 2021-11-30 MED ORDER — POLYETHYLENE GLYCOL 3350 17 G PO PACK
17.0000 g | PACK | Freq: Every day | ORAL | Status: DC | PRN
  Administered 2021-12-03: 17 g via ORAL
  Filled 2021-11-30: qty 1

## 2021-11-30 MED ORDER — OXYCODONE HCL 5 MG PO TABS
5.0000 mg | ORAL_TABLET | ORAL | Status: DC | PRN
  Administered 2021-12-01: 5 mg via ORAL
  Filled 2021-11-30: qty 1

## 2021-11-30 MED ORDER — ROPIVACAINE HCL 5 MG/ML IJ SOLN
INTRAMUSCULAR | Status: DC | PRN
  Administered 2021-11-30: 20 mL via PERINEURAL

## 2021-11-30 MED ORDER — MORPHINE SULFATE (PF) 2 MG/ML IV SOLN
2.0000 mg | INTRAVENOUS | Status: DC | PRN
  Administered 2021-11-30: 2 mg via INTRAVENOUS
  Filled 2021-11-30: qty 1

## 2021-11-30 MED ORDER — BISACODYL 5 MG PO TBEC: 5.0000 mg | DELAYED_RELEASE_TABLET | Freq: Every day | ORAL | Status: DC | PRN

## 2021-11-30 MED ORDER — METHOCARBAMOL 1000 MG/10ML IJ SOLN: 500.0000 mg | Freq: Four times a day (QID) | INTRAVENOUS | Status: DC | PRN

## 2021-11-30 MED ORDER — DOCUSATE SODIUM 100 MG PO CAPS
100.0000 mg | ORAL_CAPSULE | Freq: Two times a day (BID) | ORAL | Status: DC
  Administered 2021-11-30 – 2021-12-04 (×7): 100 mg via ORAL
  Filled 2021-11-30 (×7): qty 1

## 2021-11-30 MED ORDER — ONDANSETRON HCL 4 MG/2ML IJ SOLN
4.0000 mg | Freq: Once | INTRAMUSCULAR | Status: AC
  Administered 2021-11-30: 4 mg via INTRAVENOUS
  Filled 2021-11-30: qty 2

## 2021-11-30 MED ORDER — BACITRACIN ZINC 500 UNIT/GM EX OINT
TOPICAL_OINTMENT | Freq: Two times a day (BID) | CUTANEOUS | Status: DC
  Administered 2021-11-30 (×2): 1 via TOPICAL
  Filled 2021-11-30: qty 56.7
  Filled 2021-11-30 (×2): qty 0.9

## 2021-11-30 MED ORDER — MIRABEGRON ER 25 MG PO TB24
50.0000 mg | ORAL_TABLET | Freq: Every day | ORAL | Status: DC
  Administered 2021-11-30 – 2021-12-03 (×4): 50 mg via ORAL
  Filled 2021-11-30: qty 1
  Filled 2021-11-30 (×2): qty 2
  Filled 2021-11-30: qty 1
  Filled 2021-11-30: qty 2

## 2021-11-30 MED ORDER — FENTANYL CITRATE (PF) 100 MCG/2ML IJ SOLN
INTRAMUSCULAR | Status: AC
  Administered 2021-11-30: 50 ug
  Filled 2021-11-30: qty 2

## 2021-11-30 MED ORDER — AMLODIPINE BESYLATE 5 MG PO TABS
5.0000 mg | ORAL_TABLET | Freq: Every day | ORAL | Status: DC
  Administered 2021-12-01 – 2021-12-03 (×2): 5 mg via ORAL
  Filled 2021-11-30 (×3): qty 1

## 2021-11-30 MED ORDER — PANTOPRAZOLE SODIUM 40 MG PO TBEC
40.0000 mg | DELAYED_RELEASE_TABLET | Freq: Every day | ORAL | Status: DC
  Administered 2021-12-01 – 2021-12-04 (×3): 40 mg via ORAL
  Filled 2021-11-30 (×3): qty 1

## 2021-11-30 NOTE — Consult Note (Signed)
Reason for Consult:Right hip fx Referring Physician: Jonah Blue Time called: 1327 Time at bedside: 1344   Peggy [name] is an 88 y.o. female.  HPI: Peggy [name] was walking along the poolside when she slipped and fell. She did not have pain immediately but EMS had already been called and she was transported to the ED. En route she began to have right hip pain and x-rays confirmed a femoral neck fx. Orthopedic surgery was consulted. She lives at [facility] and ambulates with a cane or RW.  Past Medical History:  Diagnosis Date  . Anemia   . Chronic kidney disease    stage 3  . GERD (gastroesophageal reflux disease)   . Hypertension     Past Surgical History:  Procedure Laterality Date  . ORIF ANKLE FRACTURE Right 01/13/2021   Procedure: OPEN TREATMENT OF RIGHT TRIMALLEOLAR ANKLE FRACTURE WITHOUT POSTERIOR FIXATION, POSSIBLE SYNDESMOSIS;  Surgeon: Terance Hart, MD;  Location: [facility] OR;  Service: Orthopedics;  Laterality: Right;    Family History  Problem Relation Age of Onset  . Heart failure Mother   . Heart disease Father   . Arthritis Sister     Social History:  reports that she quit smoking about 64 years ago. Her smoking use included cigarettes. She has never used smokeless tobacco. She reports that she does not currently use alcohol. She reports that she does not use drugs.  Allergies:  Allergies  Allergen Reactions  . Ace Inhibitors   . Flagyl [Metronidazole]   . Penicillins Swelling    facial    Medications: I have reviewed the patient's current medications.  Results for orders placed or performed during the hospital encounter of 11/30/21 (from the past 48 hour(s))  Basic metabolic panel     Status: Abnormal   Collection Time: 11/30/21 11:49 AM  Result Value Ref Range   Sodium 139 135 - 145 mmol/L   Potassium 4.1 3.5 - 5.1 mmol/L   Chloride 103 98 - 111 mmol/L   CO2 25 22 - 32 mmol/L   Glucose, Bld 93 70 - 99 mg/dL    Comment: Glucose reference  range applies only to samples taken after fasting for at least 8 hours.   BUN 29 (H) 8 - 23 mg/dL   Creatinine, Ser 1.61 (H) 0.44 - 1.00 mg/dL   Calcium 09.6 8.9 - 04.5 mg/dL   GFR, Estimated 29 (L) >60 mL/min    Comment: (NOTE) Calculated using the CKD-EPI Creatinine Equation (2021)    Anion gap 11 5 - 15    Comment: Performed at [facility], [address], [place], [place] [address]  CBC with Differential     Status: Abnormal   Collection Time: 11/30/21 11:49 AM  Result Value Ref Range   WBC 7.0 4.0 - 10.5 K/uL   RBC 3.31 (L) 3.87 - 5.11 MIL/uL   Hemoglobin 10.5 (L) 12.0 - 15.0 g/dL   HCT 19.1 (L) 47.8 - 29.5 %   MCV 98.8 80.0 - 100.0 fL   MCH 31.7 26.0 - 34.0 pg   MCHC 32.1 30.0 - 36.0 g/dL   RDW 62.1 30.8 - 65.7 %   Platelets 231 150 - 400 K/uL   nRBC 0.0 0.0 - 0.2 %   Neutrophils Relative % 71 %   Neutro Abs 5.0 1.7 - 7.7 K/uL   Lymphocytes Relative 17 %   Lymphs Abs 1.2 0.7 - 4.0 K/uL   Monocytes Relative 6 %   Monocytes Absolute 0.4 0.1 -  1.0 K/uL   Eosinophils Relative 5 %   Eosinophils Absolute 0.3 0.0 - 0.5 K/uL   Basophils Relative 1 %   Basophils Absolute 0.1 0.0 - 0.1 K/uL   Immature Granulocytes 0 %   Abs Immature Granulocytes 0.02 0.00 - 0.07 K/uL    Comment: Performed at [facility], [address], [place], [place] [address]  Protime-INR     Status: None   Collection Time: 11/30/21 11:49 AM  Result Value Ref Range   Prothrombin Time 12.9 11.4 - 15.2 seconds   INR 1.0 0.8 - 1.2    Comment: (NOTE) INR goal varies based on device and disease states. Performed at [facility], [address] [place], [place] [address]   Type and screen [facility]     Status: None   Collection Time: 11/30/21 12:15 PM  Result Value Ref Range   ABO/RH(D) O POS    Antibody Screen NEG    Sample Expiration      12/03/2021,2359 Performed at [facility], [address] [place], [place] [address]     DG Chest 1 View  Result  Date: 11/30/2021 CLINICAL DATA:  Fall.  Concern for hip fracture. EXAM: CHEST  1 VIEW COMPARISON:  Radiographs 06/08/2016. FINDINGS: 1303 hours. Patient rotation to the right. The heart size and mediastinal contours are stable with cardiomegaly, aortic tortuosity and a small hiatal hernia. The lungs appear clear. No pleural effusion or pneumothorax. No acute osseous findings are seen in the chest. Telemetry leads overlie the chest. IMPRESSION: No evidence of acute chest injury or active cardiopulmonary process. Electronically Signed   By: Carey Bullocks M.D.   On: 11/30/2021 13:22   DG Hip Unilat With Pelvis 2-3 Views Right  Result Date: 11/30/2021 CLINICAL DATA:  Fall onto right hip.  Concern for hip fracture. EXAM: DG HIP (WITH OR WITHOUT PELVIS) 2-3V RIGHT COMPARISON:  None Available. FINDINGS: The bones are demineralized. There is an acute, mildly displaced fracture of the right femoral neck. No evidence of acute pelvic fracture or dislocation. Underlying mild degenerative changes of both hips. Central pelvic calcification is likely a phlebolith. Lower lumbar spondylosis noted. IMPRESSION: Acute mildly displaced fracture of the right femoral neck. Electronically Signed   By: Carey Bullocks M.D.   On: 11/30/2021 13:18    Review of Systems  HENT:  Negative for ear discharge, ear pain, hearing loss and tinnitus.   Eyes:  Negative for photophobia and pain.  Respiratory:  Negative for cough and shortness of breath.   Cardiovascular:  Negative for chest pain.  Gastrointestinal:  Negative for abdominal pain, nausea and vomiting.  Genitourinary:  Negative for dysuria, flank pain, frequency and urgency.  Musculoskeletal:  Positive for arthralgias (Right hip). Negative for back pain, myalgias and neck pain.  Neurological:  Negative for dizziness and headaches.  Hematological:  Does not bruise/bleed easily.  Psychiatric/Behavioral:  The patient is not nervous/anxious.    Blood pressure (!) 175/93, pulse  83, temperature 97.6 F (36.4 C), temperature source Oral, resp. rate 17, height 5\' 4"  (1.626 m), weight 52.2 kg, SpO2 97 %. Physical Exam Constitutional:      General: She is not in acute distress.    Appearance: She is well-developed. She is not diaphoretic.  HENT:     Head: Normocephalic and atraumatic.  Eyes:     General: No scleral icterus.       Right eye: No discharge.  Left eye: No discharge.     Conjunctiva/sclera: Conjunctivae normal.  Cardiovascular:     Rate and Rhythm: Normal rate and regular rhythm.  Pulmonary:     Effort: Pulmonary effort is normal. No respiratory distress.  Musculoskeletal:     Cervical back: Normal range of motion.     Comments: RLE No traumatic wounds, ecchymosis, or rash  Mild TTP hip  No knee or ankle effusion  Knee stable to varus/ valgus and anterior/posterior stress  Sens DPN, SPN, TN intact  Motor EHL, ext, flex, evers 5/5  DP 1+, PT 0, No significant edema  Skin:    General: Skin is warm and dry.  Neurological:     Mental Status: She is alert.  Psychiatric:        Mood and Affect: Mood normal.        Behavior: Behavior normal.    Assessment/Plan: Right hip fx --     Freeman Caldron, PA-C Orthopedic Surgery [phone] 11/30/2021, 2:00 PM

## 2021-11-30 NOTE — Anesthesia Procedure Notes (Signed)
Anesthesia Regional Block: Peng block   Pre-Anesthetic Checklist: , timeout performed,  Correct Patient, Correct Site, Correct Laterality,  Correct Procedure, Correct Position, site marked,  Risks and benefits discussed,  Surgical consent,  Pre-op evaluation,  At surgeon's request and post-op pain management  Laterality: Right  Prep: chloraprep       Needles:  Injection technique: Single-shot  Needle Type: Echogenic Needle     Needle Length: 9cm  Needle Gauge: 21     Additional Needles:   Procedures:,,,, ultrasound used (permanent image in chart),,    Narrative:  Start time: 11/30/2021 3:26 PM End time: 11/30/2021 3:32 PM Injection made incrementally with aspirations every 5 mL.  Performed by: Personally  Anesthesiologist: Santa Lighter, MD  Additional Notes: No pain on injection. No increased resistance to injection. Injection made in 5cc increments.  Good needle visualization.  Patient tolerated procedure well.

## 2021-11-30 NOTE — H&P (Signed)
History and Physical    Patient: Peggy [name] ESP:[id number] DOB: [birth year] DOA: 11/30/2021 DOS: the patient was seen and examined on 11/30/2021 PCP: Mast, Man X, NP  Patient coming from: ALF/ILF - [facility]; NOK: Daughter, Nannette Zill, [phone]   Chief Complaint: Fall  HPI: [name] is a 88 y.o. female with medical history significant of HTN and HLD presenting with a fall.   She has been doing water exercise program.  She was getting out of the pool and was walking to the changing room and she slipped on the wet floor.  She didn't have her cane and there was no rail.  She has a chronic nevus but does actually have bruising of her R forehead and eye.  She injured her R hip.  Surgery is planned for tomorrow.     ER Course:  Hip fracture.  Getting out of the pool and slipped.  R femoral neck fracture.  Anemia and CKD appear stable.  Ortho to see.    Review of Systems: As mentioned in the history of present illness. All other systems reviewed and are negative. Past Medical History:  Diagnosis Date   Anemia    Chronic kidney disease    stage 3   GERD (gastroesophageal reflux disease)    Hypertension    Past Surgical History:  Procedure Laterality Date   ORIF ANKLE FRACTURE Right 01/13/2021   Procedure: OPEN TREATMENT OF RIGHT TRIMALLEOLAR ANKLE FRACTURE WITHOUT POSTERIOR FIXATION, POSSIBLE SYNDESMOSIS;  Surgeon: Erle Crocker, MD;  Location: [place];  Service: Orthopedics;  Laterality: Right;   Social History:  reports that she quit smoking about 64 years ago. Her smoking use included cigarettes. She has never used smokeless tobacco. She reports that she does not currently use alcohol. She reports that she does not use drugs.  Allergies  Allergen Reactions   Ace Inhibitors Other (See Comments)    Reaction unknown   Flagyl [Metronidazole] Other (See Comments)    Reaction unknown   Penicillins Swelling and Other (See Comments)    Facial  swelling     Family History  Problem Relation Age of Onset   Heart failure Mother    Heart disease Father    Arthritis Sister     Prior to Admission medications   Medication Sig Start Date End Date Taking? Authorizing Provider  valsartan (DIOVAN) 160 MG tablet Take 1 tablet (160 mg total) by mouth daily. 10/28/21   Mast, Man X, NP  amLODipine (NORVASC) 5 MG tablet Take 5 mg by mouth daily.    [provider]  cholecalciferol (VITAMIN D) 25 MCG (1000 UNIT) tablet Take 2 tablets (2,000 Units total) by mouth daily. 04/13/21   Mast, Man X, NP  ferrous sulfate 325 (65 FE) MG tablet Take 1 tablet (325 mg total) by mouth daily with breakfast. MON, WED, FRI 04/13/21   Mast, Man X, NP  furosemide (LASIX) 20 MG tablet Take 1 tablet (20 mg total) by mouth daily. Patient taking differently: Take 20 mg by mouth 3 (three) times a week. 04/13/21   Mast, Man X, NP  hydrocortisone 2.5%-nystatin-zinc oxide 20% 1:1:1 ointment mixture Apply topically daily. 04/23/21   Mast, Man X, NP  mirabegron ER (MYRBETRIQ) 25 MG TB24 tablet Take Two tablets by mouth once daily. 10/26/21   Mast, Man X, NP  Multiple Vitamins-Minerals (ICAPS AREDS 2 PO) Take by mouth 2 (two) times daily.    [provider]  pantoprazole (PROTONIX) 40 MG tablet Take  1 tablet (40 mg total) by mouth daily. 09/04/21   Mast, Man X, NP  vitamin C (ASCORBIC ACID) 250 MG tablet Take 1 tablet (250 mg total) by mouth daily. 04/13/21   Mast, Man X, NP    Physical Exam: Vitals:   11/30/21 1400 11/30/21 1430 11/30/21 1500 11/30/21 1515  BP: (!) 172/95 (!) 155/100 (!) 148/84   Pulse: 83 86 86   Resp: 15 18 16    Temp:      TempSrc:      SpO2: 95% 90%  92%  Weight:      Height:       General:  Appears calm and comfortable and is in NAD except when she moves her R hip Eyes:  PERRL, EOMI, normal lids, iris; R perioral ecchymosis ENT:  grossly normal hearing, lips & tongue, mmm Neck:  no LAD, masses or thyromegaly Cardiovascular:   RRR, no m/r/g. No LE edema.  Respiratory:   CTA bilaterally with no wheezes/rales/rhonchi.  Normal respiratory effort. Abdomen:  soft, NT, ND Skin:  R forehead nevus with overlying ecchymosis extending into periorbital region; also with chronic BLE stasis changes Musculoskeletal:  R leg is shortened and externally rotated Lower extremity:  No LE edema.  Limited foot exam with no ulcerations.  2+ distal pulses. Psychiatric:  grossly normal mood and affect, speech fluent and appropriate, A&O x 3 Neurologic:  CN 2-12 grossly intact, moves all extremities in coordinated fashion other than RLE   Radiological Exams on Admission: Independently reviewed - see discussion in A/P where applicable  DG Chest 1 View  Result Date: 11/30/2021 CLINICAL DATA:  Fall.  Concern for hip fracture. EXAM: CHEST  1 VIEW COMPARISON:  Radiographs 06/08/2016. FINDINGS: 1303 hours. Patient rotation to the right. The heart size and mediastinal contours are stable with cardiomegaly, aortic tortuosity and a small hiatal hernia. The lungs appear clear. No pleural effusion or pneumothorax. No acute osseous findings are seen in the chest. Telemetry leads overlie the chest. IMPRESSION: No evidence of acute chest injury or active cardiopulmonary process. Electronically Signed   By: Richardean Sale M.D.   On: 11/30/2021 13:22   DG Hip Unilat With Pelvis 2-3 Views Right  Result Date: 11/30/2021 CLINICAL DATA:  Fall onto right hip.  Concern for hip fracture. EXAM: DG HIP (WITH OR WITHOUT PELVIS) 2-3V RIGHT COMPARISON:  None Available. FINDINGS: The bones are demineralized. There is an acute, mildly displaced fracture of the right femoral neck. No evidence of acute pelvic fracture or dislocation. Underlying mild degenerative changes of both hips. Central pelvic calcification is likely a phlebolith. Lower lumbar spondylosis noted. IMPRESSION: Acute mildly displaced fracture of the right femoral neck. Electronically Signed   By: Richardean Sale M.D.   On: 11/30/2021 13:18    EKG: Independently reviewed.  NSR with rate 80; nonspecific ST changes with no evidence of acute ischemia   Labs on Admission: I have personally reviewed the available labs and imaging studies at the time of the admission.  Pertinent labs:    BUN 29/Creatinine 1.65/GFR 29 - stable Hgb 10.5 - stable INR 1   Assessment and Plan: Principal Problem:   Hip fracture (HCC) Active Problems:   Hypertension   CKD (chronic kidney disease) stage 4, GFR 15-29 ml/min (HCC)   DNR (do not resuscitate)    R Hip fracture -Mechanical fall resulting in hip fracture -Orthopedics consulted -NPO after midnight in anticipation of surgical repair tomorrow -SCDs overnight, start Lovenox post-operatively (or as per ortho) -Pain  control with Tylenol, Robaxin, Oxycodone, and Morphine prn -TOC team consult for rehab placement -Will need PT consult post-operatively -Hip fracture order set utilized -TXA per orthopedics -Fascia iliacus block ordered per anesthesia  Pre-operative stratification -Orthopedic/spinal surgery is associated with an intermediate (1-5%) cardiovascular risk for cardiac death and nonfatal MI -She has no known RF so her revised cardiac index gives a risk estimate of 3.9% -It is reasonable for her to go to the OR without additional evaluation  HTN -Continue amlodipine  Stage 4 CKD -Appears to be stable at this time -Hold valsartan and furosemide for now  DNR -I have discussed code status with the patient and her daughter and  they are in agreement that the patient would not desire resuscitation and would prefer to die a natural death should that situation arise. -She will need a gold out of facility DNR form at the time of discharge      Advance Care Planning:   Code Status: DNR   Consults: Orthopedics; TOC team, Nutrition; will need PT post-operatively   DVT Prophylaxis: SCDs until approved for Lovenox by orthopedics  Family  Communication: Daughter and boyfriend were present throughout evaluation  Severity of Illness: The appropriate patient status for this patient is INPATIENT. Inpatient status is judged to be reasonable and necessary in order to provide the required intensity of service to ensure the patient's safety. The patient's presenting symptoms, physical exam findings, and initial radiographic and laboratory data in the context of their chronic comorbidities is felt to place them at high risk for further clinical deterioration. Furthermore, it is not anticipated that the patient will be medically stable for discharge from the hospital within 2 midnights of admission.   * I certify that at the point of admission it is my clinical judgment that the patient will require inpatient hospital care spanning beyond 2 midnights from the point of admission due to high intensity of service, high risk for further deterioration and high frequency of surveillance required.*  Author: Karmen Bongo, MD 11/30/2021 5:16 PM  For on call review [email or web address].

## 2021-11-30 NOTE — Anesthesia Preprocedure Evaluation (Signed)
Anesthesia Evaluation  Patient identified by MRN, date of birth, ID band Patient awake    Reviewed: Allergy & Precautions, NPO status , Patient's Chart, lab work & pertinent test results  Airway Mallampati: II  TM Distance: >3 FB Neck ROM: Full    Dental  (+) Teeth Intact, Dental Advisory Given   Pulmonary former smoker,    Pulmonary exam normal breath sounds clear to auscultation       Cardiovascular hypertension, Pt. on medications Normal cardiovascular exam Rhythm:Regular Rate:Normal     Neuro/Psych negative neurological ROS     GI/Hepatic Neg liver ROS, GERD  Medicated,  Endo/Other  negative endocrine ROS  Renal/GU Renal InsufficiencyRenal disease     Musculoskeletal  (+) Arthritis , Right hip fracture    Abdominal   Peds  Hematology negative hematology ROS (+)   Anesthesia Other Findings Day of surgery medications reviewed with the patient.  Reproductive/Obstetrics                             Anesthesia Physical Anesthesia Plan  ASA: 3  Anesthesia Plan: Regional   Post-op Pain Management:    Induction:   PONV Risk Score and Plan: 2 and Treatment may vary due to age or medical condition  Airway Management Planned: Natural Airway  Additional Equipment:   Intra-op Plan:   Post-operative Plan:   Informed Consent: I have reviewed the patients History and Physical, chart, labs and discussed the procedure including the risks, benefits and alternatives for the proposed anesthesia with the patient or authorized representative who has indicated his/her understanding and acceptance.     Dental advisory given  Plan Discussed with: CRNA  Anesthesia Plan Comments: (PENG block in PACU)        Anesthesia Quick Evaluation

## 2021-11-30 NOTE — ED Notes (Signed)
Pt transported to OR for pain block

## 2021-11-30 NOTE — ED Provider Notes (Signed)
[facility] EMERGENCY DEPARTMENT Provider Note   CSN: [id number] Arrival date & time: 11/30/21  1130     History  Chief Complaint  Patient presents with   Lytle Michaels    Peggy [name] is a 88 y.o. female.  Patient history of hypertension, osteoporosis, chronic kidney disease, chronic anemia, chronic lower extremity edema --presents to the emergency department today for evaluation of right-sided hip and leg pain after a fall.  Patient was participating in pool exercises and had just gotten out of the pool.  She states that the ground was slick and she fell onto her right hip.  She denies hitting her head.  She sustained a laceration to her right great toe.  She also has pain in her right hip when movement.  She was transported to the emergency department by EMS.  No treatments prior to arrival.  She denies any associated lightheadedness, dizziness, chest pain or shortness of breath.       Home Medications Prior to Admission medications   Medication Sig Start Date End Date Taking? Authorizing Provider  valsartan (DIOVAN) 160 MG tablet Take 1 tablet (160 mg total) by mouth daily. 10/28/21   Mast, Man X, NP  amLODipine (NORVASC) 5 MG tablet Take 5 mg by mouth daily.    [provider]  cholecalciferol (VITAMIN D) 25 MCG (1000 UNIT) tablet Take 2 tablets (2,000 Units total) by mouth daily. 04/13/21   Mast, Man X, NP  ferrous sulfate 325 (65 FE) MG tablet Take 1 tablet (325 mg total) by mouth daily with breakfast. MON, WED, FRI 04/13/21   Mast, Man X, NP  furosemide (LASIX) 20 MG tablet Take 1 tablet (20 mg total) by mouth daily. Patient taking differently: Take 20 mg by mouth 3 (three) times a week. 04/13/21   Mast, Man X, NP  hydrocortisone 2.5%-nystatin-zinc oxide 20% 1:1:1 ointment mixture Apply topically daily. 04/23/21   Mast, Man X, NP  mirabegron ER (MYRBETRIQ) 25 MG TB24 tablet Take Two tablets by mouth once daily. 10/26/21   Mast, Man X, NP  Multiple  Vitamins-Minerals (ICAPS AREDS 2 PO) Take by mouth 2 (two) times daily.    [provider]  pantoprazole (PROTONIX) 40 MG tablet Take 1 tablet (40 mg total) by mouth daily. 09/04/21   Mast, Man X, NP  vitamin C (ASCORBIC ACID) 250 MG tablet Take 1 tablet (250 mg total) by mouth daily. 04/13/21   Mast, Man X, NP      Allergies    Ace inhibitors, Flagyl [metronidazole], and Penicillins    Review of Systems   Review of Systems  Physical Exam Updated Vital Signs BP (!) 158/89 (BP Location: Right Arm)   Pulse 75   Temp 97.6 F (36.4 C) (Oral)   Resp 15   Ht 5\' 4"  (1.626 m)   Wt 52.2 kg   SpO2 99%   BMI 19.74 kg/m   Physical Exam Vitals and nursing note reviewed.  Constitutional:      General: She is not in acute distress.    Appearance: She is well-developed.  HENT:     Head: Normocephalic and atraumatic.     Right Ear: External ear normal.     Left Ear: External ear normal.     Nose: Nose normal.  Eyes:     Conjunctiva/sclera: Conjunctivae normal.  Cardiovascular:     Rate and Rhythm: Normal rate and regular rhythm.     Heart sounds: No murmur heard. Pulmonary:  Effort: No respiratory distress.     Breath sounds: No wheezing, rhonchi or rales.  Abdominal:     Palpations: Abdomen is soft.     Tenderness: There is no abdominal tenderness. There is no guarding or rebound.  Musculoskeletal:     Cervical back: Normal range of motion and neck supple.     Right hip: Tenderness present. Decreased range of motion.     Right lower leg: No edema.     Left lower leg: No edema.     Comments: Patient is comfortable with right leg still, however has significant pain with passive flexion of the hip.  Right foot: Band-Aid in place over left great toe abrasion/laceration at the tip of the toe.  2+ DP pulses bilaterally.  Skin:    General: Skin is warm and dry.     Findings: No rash.  Neurological:     General: No focal deficit present.     Mental Status: She is alert.  Mental status is at baseline.     Motor: No weakness.  Psychiatric:        Mood and Affect: Mood normal.     ED Results / Procedures / Treatments   Labs (all labs ordered are listed, but only abnormal results are displayed) Labs Reviewed  BASIC METABOLIC PANEL - Abnormal; Notable for the following components:      Result Value   BUN 29 (*)    Creatinine, Ser 1.65 (*)    GFR, Estimated 29 (*)    All other components within normal limits  CBC WITH DIFFERENTIAL/PLATELET - Abnormal; Notable for the following components:   RBC 3.31 (*)    Hemoglobin 10.5 (*)    HCT 32.7 (*)    All other components within normal limits  PROTIME-INR  TYPE AND SCREEN    EKG EKG Interpretation  Date/Time:  Monday November 30 2021 12:29:09 EDT Ventricular Rate:  80 PR Interval:  171 QRS Duration: 95 QT Interval:  392 QTC Calculation: 453 R Axis:   -21 Text Interpretation: Sinus rhythm Inferoposterior infarct, age indeterminate No significant change since last tracing Confirmed by Gareth Morgan [phone]) on 11/30/2021 1:05:11 PM  Radiology DG Chest 1 View  Result Date: 11/30/2021 CLINICAL DATA:  Fall.  Concern for hip fracture. EXAM: CHEST  1 VIEW COMPARISON:  Radiographs 06/08/2016. FINDINGS: 1303 hours. Patient rotation to the right. The heart size and mediastinal contours are stable with cardiomegaly, aortic tortuosity and a small hiatal hernia. The lungs appear clear. No pleural effusion or pneumothorax. No acute osseous findings are seen in the chest. Telemetry leads overlie the chest. IMPRESSION: No evidence of acute chest injury or active cardiopulmonary process. Electronically Signed   By: Richardean Sale M.D.   On: 11/30/2021 13:22   DG Hip Unilat With Pelvis 2-3 Views Right  Result Date: 11/30/2021 CLINICAL DATA:  Fall onto right hip.  Concern for hip fracture. EXAM: DG HIP (WITH OR WITHOUT PELVIS) 2-3V RIGHT COMPARISON:  None Available. FINDINGS: The bones are demineralized. There is an  acute, mildly displaced fracture of the right femoral neck. No evidence of acute pelvic fracture or dislocation. Underlying mild degenerative changes of both hips. Central pelvic calcification is likely a phlebolith. Lower lumbar spondylosis noted. IMPRESSION: Acute mildly displaced fracture of the right femoral neck. Electronically Signed   By: Richardean Sale M.D.   On: 11/30/2021 13:18    Procedures Procedures    Medications Ordered in ED Medications  bacitracin ointment (has no administration in time  range)    ED Course/ Medical Decision Making/ A&P    Patient seen and examined. History obtained directly from patient as well as daughter at bedside later in ED visit. Clinical concern for R hip fx.   Labs/EKG: Ordered CBC, BMP, type and screen.  Imaging: Ordered x-ray of the chest and R hip/pelvis.  Medications/Fluids: Considered IV pain medication, however patient declines.  We will recheck after x-rays were performed.  Most recent vital signs reviewed and are as follows: BP (!) 175/93   Pulse 83   Temp 97.6 F (36.4 C) (Oral)   Resp 17   Ht 5\' 4"  (1.626 m)   Wt 52.2 kg   SpO2 97%   BMI 19.74 kg/m   Initial impression: Concern for right hip fracture.  1:42 PM Reassessment performed. Patient appears stable.  Daughter now at bedside.  Labs personally reviewed and interpreted including: CBC shows normal white blood cell count, hemoglobin 10.5 slightly better than baseline; INR 1; BMP with mildly elevated creatinine.  Imaging personally visualized and interpreted including: X-ray of the right hip and pelvis, agree right femoral neck fracture.  Chest x-ray with tortuous aorta and cardiomegaly.  Reviewed pertinent lab work and imaging with patient at bedside. Questions answered.   Most current vital signs reviewed and are as follows: BP (!) 175/93   Pulse 83   Temp 97.6 F (36.4 C) (Oral)   Resp 17   Ht 5\' 4"  (1.626 m)   Wt 52.2 kg   SpO2 97%   BMI 19.74 kg/m   I  consulted and discussed findings and plan with Hilbert Odor, PA-C with orthopedists who will see patient.  Plan to admit patient to hospital service.  2:18 PM   Plan: Admit to hospital.  Consulted with Dr. Lorin Mer[facility] hospitalist who will see patient.                          Medical Decision Making Amount and/or Complexity of Data Reviewed Labs: ordered. Radiology: ordered.   Admit for right-sided femoral neck fracture.  Patient slipped shortly after getting out of a pool.  No concern for head or neck injury at this time and patient denies headache or neck pain.        Final Clinical Impression(s) / ED Diagnoses Final diagnoses:  Closed fracture of neck of right femur, initial encounter [facility]: Lambert Rhonda W)    Rx / [place] Orders ED Discharge Orders     None         Carlisle Cater, PA-C 11/30/21 1419    Gareth Morgan, MD 11/30/21 2205

## 2021-11-30 NOTE — ED Triage Notes (Signed)
Pt BIB EMS from [place]. She was walking along the pool and slipped and fell on her right hip, denies LOC, not on thinners, peripheral pulse intact, pt move toes. There appears to be external rotation but per EMS pt is able to rotate right foot straight.

## 2021-11-30 NOTE — H&P (View-Only) (Signed)
Reason for Consult:Right hip fx Referring Physician: Karmen Bongo Time called: [id number] Time at bedside: [place]   Peggy [name] is an 88 y.o. female.  HPI: Peggy [name] was walking along the poolside when she slipped and fell. She did not have pain immediately but EMS had already been called and she was transported to the ED. En route she began to have right hip pain and x-rays confirmed a femoral neck fx. Orthopedic surgery was consulted. She lives at [facility] and ambulates with a cane or RW.  Past Medical History:  Diagnosis Date   Anemia    Chronic kidney disease    stage 3   GERD (gastroesophageal reflux disease)    Hypertension     Past Surgical History:  Procedure Laterality Date   ORIF ANKLE FRACTURE Right 01/13/2021   Procedure: OPEN TREATMENT OF RIGHT TRIMALLEOLAR ANKLE FRACTURE WITHOUT POSTERIOR FIXATION, POSSIBLE SYNDESMOSIS;  Surgeon: Erle Crocker, MD;  Location: [facility];  Service: Orthopedics;  Laterality: Right;    Family History  Problem Relation Age of Onset   Heart failure Mother    Heart disease Father    Arthritis Sister     Social History:  reports that she quit smoking about 64 years ago. Her smoking use included cigarettes. She has never used smokeless tobacco. She reports that she does not currently use alcohol. She reports that she does not use drugs.  Allergies:  Allergies  Allergen Reactions   Ace Inhibitors    Flagyl [Metronidazole]    Penicillins Swelling    facial    Medications: I have reviewed the patient's current medications.  Results for orders placed or performed during the hospital encounter of 11/30/21 (from the past 48 hour(s))  Basic metabolic panel     Status: Abnormal   Collection Time: 11/30/21 11:49 AM  Result Value Ref Range   Sodium 139 135 - 145 mmol/L   Potassium 4.1 3.5 - 5.1 mmol/L   Chloride 103 98 - 111 mmol/L   CO2 25 22 - 32 mmol/L   Glucose, Bld 93 70 - 99 mg/dL    Comment: Glucose reference range applies  only to samples taken after fasting for at least 8 hours.   BUN 29 (H) 8 - 23 mg/dL   Creatinine, Ser 1.65 (H) 0.44 - 1.00 mg/dL   Calcium 10.1 8.9 - 10.3 mg/dL   GFR, Estimated 29 (L) >60 mL/min    Comment: (NOTE) Calculated using the CKD-EPI Creatinine Equation (2021)    Anion gap 11 5 - 15    Comment: Performed at [facility] [address], [place], [place] [address]  CBC with Differential     Status: Abnormal   Collection Time: 11/30/21 11:49 AM  Result Value Ref Range   WBC 7.0 4.0 - 10.5 K/uL   RBC 3.31 (L) 3.87 - 5.11 MIL/uL   Hemoglobin 10.5 (L) 12.0 - 15.0 g/dL   HCT 32.7 (L) 36.0 - 46.0 %   MCV 98.8 80.0 - 100.0 fL   MCH 31.7 26.0 - 34.0 pg   MCHC 32.1 30.0 - 36.0 g/dL   RDW 13.1 11.5 - 15.5 %   Platelets 231 150 - 400 K/uL   nRBC 0.0 0.0 - 0.2 %   Neutrophils Relative % 71 %   Neutro Abs 5.0 1.7 - 7.7 K/uL   Lymphocytes Relative 17 %   Lymphs Abs 1.2 0.7 - 4.0 K/uL   Monocytes Relative 6 %   Monocytes Absolute 0.4 0.1 -  1.0 K/uL   Eosinophils Relative 5 %   Eosinophils Absolute 0.3 0.0 - 0.5 K/uL   Basophils Relative 1 %   Basophils Absolute 0.1 0.0 - 0.1 K/uL   Immature Granulocytes 0 %   Abs Immature Granulocytes 0.02 0.00 - 0.07 K/uL    Comment: Performed at [facility], [place] [address], [place], [place] [address]  Protime-INR     Status: None   Collection Time: 11/30/21 11:49 AM  Result Value Ref Range   Prothrombin Time 12.9 11.4 - 15.2 seconds   INR 1.0 0.8 - 1.2    Comment: (NOTE) INR goal varies based on device and disease states. Performed at [facility], [place] [address], [place], [place] [address]   Type and screen [place]     Status: None   Collection Time: 11/30/21 12:15 PM  Result Value Ref Range   ABO/RH(D) O POS    Antibody Screen NEG    Sample Expiration      12/03/2021,2359 Performed at [facility], [place] [address], [place], [place] [address]     DG Chest 1 View  Result Date:  11/30/2021 CLINICAL DATA:  Fall.  Concern for hip fracture. EXAM: CHEST  1 VIEW COMPARISON:  Radiographs 06/08/2016. FINDINGS: 1303 hours. Patient rotation to the right. The heart size and mediastinal contours are stable with cardiomegaly, aortic tortuosity and a small hiatal hernia. The lungs appear clear. No pleural effusion or pneumothorax. No acute osseous findings are seen in the chest. Telemetry leads overlie the chest. IMPRESSION: No evidence of acute chest injury or active cardiopulmonary process. Electronically Signed   By: Richardean Sale M.D.   On: 11/30/2021 13:22   DG Hip Unilat With Pelvis 2-3 Views Right  Result Date: 11/30/2021 CLINICAL DATA:  Fall onto right hip.  Concern for hip fracture. EXAM: DG HIP (WITH OR WITHOUT PELVIS) 2-3V RIGHT COMPARISON:  None Available. FINDINGS: The bones are demineralized. There is an acute, mildly displaced fracture of the right femoral neck. No evidence of acute pelvic fracture or dislocation. Underlying mild degenerative changes of both hips. Central pelvic calcification is likely a phlebolith. Lower lumbar spondylosis noted. IMPRESSION: Acute mildly displaced fracture of the right femoral neck. Electronically Signed   By: Richardean Sale M.D.   On: 11/30/2021 13:18    Review of Systems  HENT:  Negative for ear discharge, ear pain, hearing loss and tinnitus.   Eyes:  Negative for photophobia and pain.  Respiratory:  Negative for cough and shortness of breath.   Cardiovascular:  Negative for chest pain.  Gastrointestinal:  Negative for abdominal pain, nausea and vomiting.  Genitourinary:  Negative for dysuria, flank pain, frequency and urgency.  Musculoskeletal:  Positive for arthralgias (Right hip). Negative for back pain, myalgias and neck pain.  Neurological:  Negative for dizziness and headaches.  Hematological:  Does not bruise/bleed easily.  Psychiatric/Behavioral:  The patient is not nervous/anxious.    Blood pressure (!) 175/93, pulse 83,  temperature 97.6 F (36.4 C), temperature source Oral, resp. rate 17, height 5\' 4"  (1.626 m), weight 52.2 kg, SpO2 97 %. Physical Exam Constitutional:      General: She is not in acute distress.    Appearance: She is well-developed. She is not diaphoretic.  HENT:     Head: Normocephalic and atraumatic.  Eyes:     General: No scleral icterus.       Right eye: No discharge.  Left eye: No discharge.     Conjunctiva/sclera: Conjunctivae normal.  Cardiovascular:     Rate and Rhythm: Normal rate and regular rhythm.  Pulmonary:     Effort: Pulmonary effort is normal. No respiratory distress.  Musculoskeletal:     Cervical back: Normal range of motion.     Comments: RLE No traumatic wounds, ecchymosis, or rash  Mild TTP hip  No knee or ankle effusion  Knee stable to varus/ valgus and anterior/posterior stress  Sens DPN, SPN, TN intact  Motor EHL, ext, flex, evers 5/5  DP 1+, PT 0, No significant edema  Skin:    General: Skin is warm and dry.  Neurological:     Mental Status: She is alert.  Psychiatric:        Mood and Affect: Mood normal.        Behavior: Behavior normal.    Assessment/Plan: Right hip fx -- Plan hip hemi Wednesday with Dr. Zachery Dakins. Please keep NPO after MN.    Lisette Abu, PA-C Orthopedic Surgery [phone] 11/30/2021, 2:00 PM

## 2021-11-30 NOTE — ED Notes (Addendum)
Pt resting.

## 2021-12-01 DIAGNOSIS — I10 Essential (primary) hypertension: Secondary | ICD-10-CM | POA: Diagnosis not present

## 2021-12-01 DIAGNOSIS — N184 Chronic kidney disease, stage 4 (severe): Secondary | ICD-10-CM | POA: Diagnosis not present

## 2021-12-01 DIAGNOSIS — Z66 Do not resuscitate: Secondary | ICD-10-CM | POA: Diagnosis not present

## 2021-12-01 DIAGNOSIS — S72001A Fracture of unspecified part of neck of right femur, initial encounter for closed fracture: Secondary | ICD-10-CM | POA: Diagnosis not present

## 2021-12-01 LAB — BASIC METABOLIC PANEL
Anion gap: 10 (ref 5–15)
BUN: 26 mg/dL — ABNORMAL HIGH (ref 8–23)
CO2: 24 mmol/L (ref 22–32)
Calcium: 9.5 mg/dL (ref 8.9–10.3)
Chloride: 103 mmol/L (ref 98–111)
Creatinine, Ser: 1.48 mg/dL — ABNORMAL HIGH (ref 0.44–1.00)
GFR, Estimated: 33 mL/min — ABNORMAL LOW (ref >60)
Glucose, Bld: 103 mg/dL — ABNORMAL HIGH (ref 70–99)
Potassium: 3.6 mmol/L (ref 3.5–5.1)
Sodium: 137 mmol/L (ref 135–145)

## 2021-12-01 LAB — SURGICAL PCR SCREEN
MRSA, PCR: NEGATIVE
Staphylococcus aureus: POSITIVE — AB

## 2021-12-01 LAB — CBC
HCT: 31.5 % — ABNORMAL LOW (ref 36.0–46.0)
Hemoglobin: 10.2 g/dL — ABNORMAL LOW (ref 12.0–15.0)
MCH: 32 pg (ref 26.0–34.0)
MCHC: 32.4 g/dL (ref 30.0–36.0)
MCV: 98.7 fL (ref 80.0–100.0)
Platelets: 214 10*3/uL (ref 150–400)
RBC: 3.19 MIL/uL — ABNORMAL LOW (ref 3.87–5.11)
RDW: 13 % (ref 11.5–15.5)
WBC: 9.2 10*3/uL (ref 4.0–10.5)
nRBC: 0 % (ref 0.0–0.2)

## 2021-12-01 MED ORDER — ENSURE PRE-SURGERY PO LIQD
296.0000 mL | Freq: Once | ORAL | Status: AC
  Administered 2021-12-02: 296 mL via ORAL
  Filled 2021-12-01: qty 296

## 2021-12-01 MED ORDER — CHLORHEXIDINE GLUCONATE CLOTH 2 % EX PADS
6.0000 | MEDICATED_PAD | Freq: Every day | CUTANEOUS | Status: DC
  Administered 2021-12-02 – 2021-12-03 (×2): 6 via TOPICAL

## 2021-12-01 MED ORDER — MUPIROCIN 2 % EX OINT
1.0000 | TOPICAL_OINTMENT | Freq: Two times a day (BID) | CUTANEOUS | Status: DC
  Administered 2021-12-01 – 2021-12-04 (×6): 1 via NASAL
  Filled 2021-12-01 (×2): qty 22

## 2021-12-01 MED ORDER — ADULT MULTIVITAMIN W/MINERALS CH
1.0000 | ORAL_TABLET | Freq: Every day | ORAL | Status: DC
  Administered 2021-12-01 – 2021-12-04 (×3): 1 via ORAL
  Filled 2021-12-01 (×3): qty 1

## 2021-12-01 MED ORDER — ENSURE ENLIVE PO LIQD
237.0000 mL | Freq: Three times a day (TID) | ORAL | Status: DC
  Administered 2021-12-01 – 2021-12-04 (×7): 237 mL via ORAL

## 2021-12-01 MED ORDER — BACITRACIN ZINC 500 UNIT/GM EX OINT
TOPICAL_OINTMENT | Freq: Two times a day (BID) | CUTANEOUS | Status: DC
  Administered 2021-12-01 – 2021-12-03 (×4): 15.7778 via TOPICAL
  Filled 2021-12-01: qty 28.35

## 2021-12-01 MED ORDER — HYDRALAZINE HCL 20 MG/ML IJ SOLN: 10.0000 mg | Freq: Four times a day (QID) | INTRAMUSCULAR | Status: DC | PRN

## 2021-12-01 NOTE — Progress Notes (Signed)
PROGRESS NOTE    Peggy [name]  SWF:[id number] DOB: [birth year] DOA: 11/30/2021 PCP: Mast, Man X, NP    Brief Narrative:  Peggy [name] is a 88 y.o. female with past medical history of hypertension and hyperlipidemia presented to the hospital after sustaining a mechanical fall at the water exercise program in the pool from slipping on the wet floor.  She didn't have her cane and there was no rail.  She was then brought in the hospital.  X-ray of the hip showed a right femoral neck fracture.  Patient was then admitted hospital for further evaluation and treatment.   Assessment and Plan: Principal Problem:   Closed right hip fracture (HCC) Active Problems:   Hypertension   CKD (chronic kidney disease) stage 4, GFR 15-29 ml/min (HCC)   DNR (do not resuscitate)   Right hip fracture status post mechanical fall. X-ray of the hip showed acute mildly displaced fracture of the right femoral neck.  Orthopedics on board.  Continue supportive care.  Plan for surgical intervention -Fascia iliacus block per anesthesia.  Essential HTN -Continue amlodipine, add as needed hydralazine.   Stage 4 CKD Holding valsartan and furosemide at this time we will continue to monitor renal function   DVT prophylaxis: SCDs Start: 11/30/21 1606   Code Status:     Code Status: DNR  Disposition: Unknown at this time, may be home health/rehabilitation  Status is: Inpatient  Remains inpatient appropriate because: Need for hip surgery   Family Communication: No family at bedside.  Consultants:  Orthopedics  Procedures:  None yet  Antimicrobials:  None  Anti-infectives (From admission, onward)    None       Subjective: Today, patient was seen and examined at bedside.  Patient complains of mild hip pain on moving.  Denies any shortness of breath, dizziness headache nausea vomiting.  Asking about timing for surgery  Objective: Vitals:   12/01/21 0745 12/01/21 0800 12/01/21 0815  12/01/21 0849  BP: (!) 162/92 (!) 157/84 (!) 182/93 (!) 170/96  Pulse: 80 84 86 81  Resp: 11 15 16    Temp:    99 F (37.2 C)  TempSrc:    Oral  SpO2: 99% 96% 96% 96%  Weight:      Height:        Intake/Output Summary (Last 24 hours) at 12/01/2021 0943 Last data filed at 12/01/2021 0834 Gross per 24 hour  Intake --  Output 750 ml  Net -750 ml   Filed Weights   11/30/21 1140  Weight: 52.2 kg    Physical Examination: Body mass index is 19.74 kg/m.   General:  Average built, not in obvious distress HENT:   No scleral pallor or icterus noted. Oral mucosa is moist.  Nevus and  ecchymosis over the right periorbital region. Chest:  Clear breath sounds.  Diminished breath sounds bilaterally. No crackles or wheezes.  CVS: S1 &S2 heard. No murmur.  Regular rate and rhythm. Abdomen: Soft, nontender, nondistended.  Bowel sounds are heard.   Extremities: No cyanosis, clubbing or edema.  Peripheral pulses are palpable.  Externally rotated and shortened right lower extremity. Psych: Alert, awake and oriented, normal mood CNS:  No cranial nerve deficits.  Power equal in all extremities.   Skin: Warm and dry.  No rashes noted.  Data Reviewed:   CBC: Recent Labs  Lab 11/30/21 1149 12/01/21 0449  WBC 7.0 9.2  NEUTROABS 5.0  --   HGB 10.5* 10.2*  HCT 32.7* 31.5*  MCV 98.8  98.7  PLT 231 115    Basic Metabolic Panel: Recent Labs  Lab 11/30/21 1149 12/01/21 0449  NA 139 137  K 4.1 3.6  CL 103 103  CO2 25 24  GLUCOSE 93 103*  BUN 29* 26*  CREATININE 1.65* 1.48*  CALCIUM 10.1 9.5    Liver Function Tests: No results for input(s): "AST", "ALT", "ALKPHOS", "BILITOT", "PROT", "ALBUMIN" in the last 168 hours.   Radiology Studies: DG Chest 1 View  Result Date: 11/30/2021 CLINICAL DATA:  Fall.  Concern for hip fracture. EXAM: CHEST  1 VIEW COMPARISON:  Radiographs 06/08/2016. FINDINGS: 1303 hours. Patient rotation to the right. The heart size and mediastinal contours are  stable with cardiomegaly, aortic tortuosity and a small hiatal hernia. The lungs appear clear. No pleural effusion or pneumothorax. No acute osseous findings are seen in the chest. Telemetry leads overlie the chest. IMPRESSION: No evidence of acute chest injury or active cardiopulmonary process. Electronically Signed   By: Richardean Sale M.D.   On: 11/30/2021 13:22   DG Hip Unilat With Pelvis 2-3 Views Right  Result Date: 11/30/2021 CLINICAL DATA:  Fall onto right hip.  Concern for hip fracture. EXAM: DG HIP (WITH OR WITHOUT PELVIS) 2-3V RIGHT COMPARISON:  None Available. FINDINGS: The bones are demineralized. There is an acute, mildly displaced fracture of the right femoral neck. No evidence of acute pelvic fracture or dislocation. Underlying mild degenerative changes of both hips. Central pelvic calcification is likely a phlebolith. Lower lumbar spondylosis noted. IMPRESSION: Acute mildly displaced fracture of the right femoral neck. Electronically Signed   By: Richardean Sale M.D.   On: 11/30/2021 13:18      LOS: 1 day    Flora Lipps, MD [facility] Available via Epic secure chat 7am-7pm After these hours, please refer to coverage provider listed on [email or web address] 12/01/2021, 9:43 AM

## 2021-12-01 NOTE — Progress Notes (Signed)
Initial Nutrition Assessment  DOCUMENTATION CODES:   Severe malnutrition in context of chronic illness  INTERVENTION:   Ensure Enlive po TID, each supplement provides 350 kcal and 20 grams of protein. MVI with minerals daily.  NUTRITION DIAGNOSIS:   Severe Malnutrition related to chronic illness (CKD) as evidenced by severe muscle depletion, severe fat depletion.  GOAL:   Patient will meet greater than or equal to 90% of their needs  MONITOR:   PO intake, Supplement acceptance  REASON FOR ASSESSMENT:   Consult Hip fracture protocol  ASSESSMENT:   88 yo female admitted with R hip fracture S/P mechanical fall. PMH includes HTN, HLD, CKD stage 4.  Patient reports good intake PTA. She eats 3 meals per day and snacks on occasion. She used to drink Ensure supplements, but has not drank any lately. Daughter in room does the grocery shopping for patient. She plans to purchase her some Ensure Plus supplements for her to drink after discharge.   Plans for R hip hemiarthroplasty Wednesday.    Weight history reviewed.  Patient with 4% weight loss within the past 2-3 months.   Patient meets criteria for severe PCM with severe depletion of muscle and subcutaneous fat mass. Suspect this is related to increased needs for CKD and poor intake associated with advanced age.  NUTRITION - FOCUSED PHYSICAL EXAM:  Flowsheet Row Most Recent Value  Orbital Region Moderate depletion  Upper Arm Region Severe depletion  Thoracic and Lumbar Region Severe depletion  Buccal Region Severe depletion  Temple Region Severe depletion  Clavicle Bone Region Severe depletion  Clavicle and Acromion Bone Region Severe depletion  Scapular Bone Region Severe depletion  Dorsal Hand Severe depletion  Patellar Region Unable to assess  Anterior Thigh Region Unable to assess  Posterior Calf Region Severe depletion  Edema (RD Assessment) Mild  Hair Reviewed  Eyes Reviewed  Mouth Reviewed  Skin Reviewed   Nails Reviewed       Diet Order:   Diet Order             Diet NPO time specified  Diet effective midnight           Diet regular Room service appropriate? Yes; Fluid consistency: Thin  Diet effective now                   EDUCATION NEEDS:   Education needs have been addressed  Skin:  Skin Assessment: Reviewed RN Assessment  Last BM:  no BM documented  Height:   Ht Readings from Last 1 Encounters:  11/30/21 5\' 4"  (1.626 m)    Weight:   Wt Readings from Last 1 Encounters:  11/30/21 52.2 kg    Ideal Body Weight:  54.5 kg  BMI:  Body mass index is 19.74 kg/m.  Estimated Nutritional Needs:   Kcal:  1600-1800  Protein:  75-85 gm  Fluid:  1.6-1.8 L    Lucas Mallow RD, LDN, CNSC Please refer to Amion for contact information.

## 2021-12-01 NOTE — TOC CAGE-AID Note (Signed)
Transition of Care [facility]) - CAGE-AID Screening   Patient Details  Name: Peggy [name] MRN: [id number] Date of Birth: December 09, 1928  Clinical Narrative:  Patient admitted for hip fracture after a fall while walking around the pool at [place]. Patient denies any alcohol or drug use, no need for substance abuse resources at this time.  CAGE-AID Screening:    Have You Ever Felt You Ought to Cut Down on Your Drinking or Drug Use?: No Have People Annoyed You By Critizing Your Drinking Or Drug Use?: No Have You Felt Bad Or Guilty About Your Drinking Or Drug Use?: No Have You Ever Had a Drink or Used Drugs First Thing In The Morning to Steady Your Nerves or to Get Rid of a Hangover?: No CAGE-AID Score: 0  Substance Abuse Education Offered: No

## 2021-12-02 ENCOUNTER — Inpatient Hospital Stay (HOSPITAL_COMMUNITY): Payer: Medicare Other | Admitting: Certified Registered Nurse Anesthetist

## 2021-12-02 ENCOUNTER — Encounter (HOSPITAL_COMMUNITY)
Admission: EM | Disposition: A | Discharge status: Skilled Nursing Facility | Payer: Self-pay | Source: Skilled Nursing Facility | Attending: Internal Medicine

## 2021-12-02 ENCOUNTER — Inpatient Hospital Stay (HOSPITAL_COMMUNITY): Payer: Medicare Other

## 2021-12-02 ENCOUNTER — Encounter (HOSPITAL_COMMUNITY): Payer: Self-pay | Admitting: Internal Medicine

## 2021-12-02 DIAGNOSIS — I10 Essential (primary) hypertension: Secondary | ICD-10-CM

## 2021-12-02 DIAGNOSIS — S72001A Fracture of unspecified part of neck of right femur, initial encounter for closed fracture: Secondary | ICD-10-CM

## 2021-12-02 DIAGNOSIS — E43 Unspecified severe protein-calorie malnutrition: Secondary | ICD-10-CM | POA: Insufficient documentation

## 2021-12-02 DIAGNOSIS — Z66 Do not resuscitate: Secondary | ICD-10-CM | POA: Diagnosis not present

## 2021-12-02 DIAGNOSIS — N184 Chronic kidney disease, stage 4 (severe): Secondary | ICD-10-CM | POA: Diagnosis not present

## 2021-12-02 DIAGNOSIS — Z87891 Personal history of nicotine dependence: Secondary | ICD-10-CM

## 2021-12-02 DIAGNOSIS — M1611 Unilateral primary osteoarthritis, right hip: Secondary | ICD-10-CM

## 2021-12-02 DIAGNOSIS — N289 Disorder of kidney and ureter, unspecified: Secondary | ICD-10-CM

## 2021-12-02 LAB — BASIC METABOLIC PANEL
Anion gap: 11 (ref 5–15)
BUN: 33 mg/dL — ABNORMAL HIGH (ref 8–23)
CO2: 24 mmol/L (ref 22–32)
Calcium: 9.7 mg/dL (ref 8.9–10.3)
Chloride: 100 mmol/L (ref 98–111)
Creatinine, Ser: 1.48 mg/dL — ABNORMAL HIGH (ref 0.44–1.00)
GFR, Estimated: 33 mL/min — ABNORMAL LOW (ref >60)
Glucose, Bld: 165 mg/dL — ABNORMAL HIGH (ref 70–99)
Potassium: 3.4 mmol/L — ABNORMAL LOW (ref 3.5–5.1)
Sodium: 135 mmol/L (ref 135–145)

## 2021-12-02 LAB — MAGNESIUM: Magnesium: 2.1 mg/dL (ref 1.7–2.4)

## 2021-12-02 LAB — CBC
HCT: 33.5 % — ABNORMAL LOW (ref 36.0–46.0)
Hemoglobin: 10.9 g/dL — ABNORMAL LOW (ref 12.0–15.0)
MCH: 31.9 pg (ref 26.0–34.0)
MCHC: 32.5 g/dL (ref 30.0–36.0)
MCV: 98 fL (ref 80.0–100.0)
Platelets: 237 10*3/uL (ref 150–400)
RBC: 3.42 MIL/uL — ABNORMAL LOW (ref 3.87–5.11)
RDW: 13.2 % (ref 11.5–15.5)
WBC: 14.3 10*3/uL — ABNORMAL HIGH (ref 4.0–10.5)
nRBC: 0 % (ref 0.0–0.2)

## 2021-12-02 LAB — TYPE AND SCREEN
ABO/RH(D): O POS
Antibody Screen: NEGATIVE

## 2021-12-02 SURGERY — HEMIARTHROPLASTY, HIP, DIRECT ANTERIOR APPROACH, FOR FRACTURE
Anesthesia: General | Site: Hip | Laterality: Right

## 2021-12-02 SURGERY — ARTHROPLASTY, HIP, TOTAL,POSTERIOR APPROACH
Anesthesia: General | Site: Hip | Laterality: Right

## 2021-12-02 MED ORDER — CEFAZOLIN SODIUM-DEXTROSE 2-4 GM/100ML-% IV SOLN: 2.0000 g | Freq: Three times a day (TID) | INTRAVENOUS | Status: DC

## 2021-12-02 MED ORDER — TRAZODONE HCL 50 MG PO TABS: 50.0000 mg | ORAL_TABLET | Freq: Every evening | ORAL | Status: DC | PRN

## 2021-12-02 MED ORDER — GUAIFENESIN 100 MG/5ML PO LIQD: 5.0000 mL | ORAL | Status: DC | PRN

## 2021-12-02 MED ORDER — LIDOCAINE 2% (20 MG/ML) 5 ML SYRINGE
INTRAMUSCULAR | Status: DC | PRN
  Administered 2021-12-02: 40 mg via INTRAVENOUS

## 2021-12-02 MED ORDER — SODIUM CHLORIDE (PF) 0.9 % IJ SOLN
INTRAMUSCULAR | Status: AC
  Filled 2021-12-02: qty 10

## 2021-12-02 MED ORDER — ROCURONIUM BROMIDE 10 MG/ML (PF) SYRINGE
PREFILLED_SYRINGE | INTRAVENOUS | Status: DC | PRN
  Administered 2021-12-02: 20 mg via INTRAVENOUS

## 2021-12-02 MED ORDER — ACETAMINOPHEN 10 MG/ML IV SOLN
INTRAVENOUS | Status: AC
  Filled 2021-12-02: qty 100

## 2021-12-02 MED ORDER — FENTANYL CITRATE PF 50 MCG/ML IJ SOSY
PREFILLED_SYRINGE | INTRAMUSCULAR | Status: AC
  Filled 2021-12-02: qty 1

## 2021-12-02 MED ORDER — CEFAZOLIN SODIUM-DEXTROSE 2-4 GM/100ML-% IV SOLN
INTRAVENOUS | Status: AC
  Filled 2021-12-02: qty 100

## 2021-12-02 MED ORDER — SODIUM CHLORIDE (PF) 0.9 % IJ SOLN
INTRAMUSCULAR | Status: AC
  Filled 2021-12-02: qty 50

## 2021-12-02 MED ORDER — BUPIVACAINE LIPOSOME 1.3 % IJ SUSP
INTRAMUSCULAR | Status: DC | PRN
  Administered 2021-12-02: 20 mL

## 2021-12-02 MED ORDER — AMISULPRIDE (ANTIEMETIC) 5 MG/2ML IV SOLN: 10.0000 mg | Freq: Once | INTRAVENOUS | Status: DC | PRN

## 2021-12-02 MED ORDER — ACETAMINOPHEN 325 MG PO TABS
650.0000 mg | ORAL_TABLET | Freq: Four times a day (QID) | ORAL | Status: DC | PRN
  Administered 2021-12-03 – 2021-12-04 (×3): 650 mg via ORAL
  Filled 2021-12-02 (×3): qty 2

## 2021-12-02 MED ORDER — HYDRALAZINE HCL 20 MG/ML IJ SOLN: 10.0000 mg | INTRAMUSCULAR | Status: DC | PRN

## 2021-12-02 MED ORDER — POTASSIUM CHLORIDE 10 MEQ/100ML IV SOLN
10.0000 meq | INTRAVENOUS | Status: AC
  Administered 2021-12-02 (×2): 10 meq via INTRAVENOUS
  Filled 2021-12-02 (×3): qty 100

## 2021-12-02 MED ORDER — MEPERIDINE HCL 50 MG/ML IJ SOLN: 6.2500 mg | INTRAMUSCULAR | Status: DC | PRN

## 2021-12-02 MED ORDER — SODIUM CHLORIDE (PF) 0.9 % IJ SOLN
INTRAMUSCULAR | Status: DC | PRN
  Administered 2021-12-02: 60 mL

## 2021-12-02 MED ORDER — FENTANYL CITRATE PF 50 MCG/ML IJ SOSY
25.0000 ug | PREFILLED_SYRINGE | INTRAMUSCULAR | Status: DC | PRN
  Administered 2021-12-02: 50 ug via INTRAVENOUS

## 2021-12-02 MED ORDER — ASPIRIN 81 MG PO TBEC
81.0000 mg | DELAYED_RELEASE_TABLET | Freq: Two times a day (BID) | ORAL | Status: DC
  Administered 2021-12-03 – 2021-12-04 (×3): 81 mg via ORAL
  Filled 2021-12-02 (×3): qty 1

## 2021-12-02 MED ORDER — ACETAMINOPHEN 10 MG/ML IV SOLN: 1000.0000 mg | Freq: Once | INTRAVENOUS | Status: DC | PRN

## 2021-12-02 MED ORDER — 0.9 % SODIUM CHLORIDE (POUR BTL) OPTIME
TOPICAL | Status: DC | PRN
  Administered 2021-12-02: 1000 mL

## 2021-12-02 MED ORDER — LACTATED RINGERS IV SOLN: INTRAVENOUS | Status: DC

## 2021-12-02 MED ORDER — LACTATED RINGERS IV SOLN: INTRAVENOUS | Status: DC | PRN

## 2021-12-02 MED ORDER — PHENYLEPHRINE 80 MCG/ML (10ML) SYRINGE FOR IV PUSH (FOR BLOOD PRESSURE SUPPORT)
PREFILLED_SYRINGE | INTRAVENOUS | Status: DC | PRN
  Administered 2021-12-02 (×2): 160 ug via INTRAVENOUS
  Administered 2021-12-02: 80 ug via INTRAVENOUS
  Administered 2021-12-02: 160 ug via INTRAVENOUS
  Administered 2021-12-02 (×2): 80 ug via INTRAVENOUS

## 2021-12-02 MED ORDER — ISOPROPYL ALCOHOL 70 % SOLN
Status: AC
Start: 2021-12-02 — End: ?
  Filled 2021-12-02: qty 480

## 2021-12-02 MED ORDER — ONDANSETRON HCL 4 MG/2ML IJ SOLN
INTRAMUSCULAR | Status: DC | PRN
  Administered 2021-12-02: 4 mg via INTRAVENOUS

## 2021-12-02 MED ORDER — IPRATROPIUM-ALBUTEROL 0.5-2.5 (3) MG/3ML IN SOLN: 3.0000 mL | RESPIRATORY_TRACT | Status: DC | PRN

## 2021-12-02 MED ORDER — ACETAMINOPHEN 160 MG/5ML PO SOLN: 325.0000 mg | Freq: Once | ORAL | Status: DC | PRN

## 2021-12-02 MED ORDER — ACETAMINOPHEN 325 MG PO TABS: 325.0000 mg | ORAL_TABLET | Freq: Once | ORAL | Status: DC | PRN

## 2021-12-02 MED ORDER — BUPIVACAINE LIPOSOME 1.3 % IJ SUSP
INTRAMUSCULAR | Status: AC
  Filled 2021-12-02: qty 20

## 2021-12-02 MED ORDER — POVIDONE-IODINE 10 % EX SWAB: 2.0000 | Freq: Once | CUTANEOUS | Status: DC

## 2021-12-02 MED ORDER — SODIUM CHLORIDE 0.9 % IR SOLN
Status: DC | PRN
  Administered 2021-12-02: 3000 mL
  Administered 2021-12-02: 1000 mL

## 2021-12-02 MED ORDER — METOPROLOL TARTRATE 5 MG/5ML IV SOLN: 5.0000 mg | INTRAVENOUS | Status: DC | PRN

## 2021-12-02 MED ORDER — CHLORHEXIDINE GLUCONATE 4 % EX LIQD: 60.0000 mL | Freq: Once | CUTANEOUS | Status: DC

## 2021-12-02 MED ORDER — PROPOFOL 10 MG/ML IV BOLUS
INTRAVENOUS | Status: DC | PRN
  Administered 2021-12-02: 70 mg via INTRAVENOUS

## 2021-12-02 MED ORDER — ISOPROPYL ALCOHOL 70 % SOLN
Status: DC | PRN
  Administered 2021-12-02: 1 via TOPICAL

## 2021-12-02 MED ORDER — FENTANYL CITRATE (PF) 100 MCG/2ML IJ SOLN
INTRAMUSCULAR | Status: DC | PRN
  Administered 2021-12-02 (×4): 25 ug via INTRAVENOUS

## 2021-12-02 MED ORDER — SUGAMMADEX SODIUM 200 MG/2ML IV SOLN
INTRAVENOUS | Status: DC | PRN
  Administered 2021-12-02: 100 mg via INTRAVENOUS

## 2021-12-02 MED ORDER — TRANEXAMIC ACID-NACL 1000-0.7 MG/100ML-% IV SOLN
1000.0000 mg | INTRAVENOUS | Status: AC
  Administered 2021-12-02: 1000 mg via INTRAVENOUS

## 2021-12-02 MED ORDER — CEFAZOLIN SODIUM-DEXTROSE 2-4 GM/100ML-% IV SOLN
2.0000 g | INTRAVENOUS | Status: AC
  Administered 2021-12-02: 2 g via INTRAVENOUS

## 2021-12-02 MED ORDER — CEFAZOLIN SODIUM-DEXTROSE 2-4 GM/100ML-% IV SOLN
2.0000 g | Freq: Once | INTRAVENOUS | Status: AC
  Administered 2021-12-03: 2 g via INTRAVENOUS
  Filled 2021-12-02: qty 100

## 2021-12-02 MED ORDER — ACETAMINOPHEN 10 MG/ML IV SOLN
INTRAVENOUS | Status: DC | PRN
  Administered 2021-12-02: 800 mg via INTRAVENOUS

## 2021-12-02 MED ORDER — FENTANYL CITRATE (PF) 100 MCG/2ML IJ SOLN
INTRAMUSCULAR | Status: AC
  Filled 2021-12-02: qty 2

## 2021-12-02 MED ORDER — TRANEXAMIC ACID-NACL 1000-0.7 MG/100ML-% IV SOLN
INTRAVENOUS | Status: AC
  Filled 2021-12-02: qty 100

## 2021-12-02 MED ORDER — WATER FOR IRRIGATION, STERILE IR SOLN
Status: DC | PRN
  Administered 2021-12-02: 2000 mL

## 2021-12-02 SURGICAL SUPPLY — 71 items
BAG COUNTER SPONGE SURGICOUNT (BAG) ×1 IMPLANT
BAG DECANTER FOR FLEXI CONT (MISCELLANEOUS) ×2 IMPLANT
BAG ZIPLOCK 12X15 (MISCELLANEOUS) ×2 IMPLANT
BLADE SAW SAG 25X90X1.19 (BLADE) ×2 IMPLANT
BRUSH FEMORAL CANAL (MISCELLANEOUS) ×1 IMPLANT
CEMENT BONE SIMPLEX SPEEDSET (Cement) ×2 IMPLANT
CEMENT RESTRICTOR BONE PREP ST (Cement) ×1 IMPLANT
CHLORAPREP W/TINT 26 (MISCELLANEOUS) ×4 IMPLANT
COVER SURGICAL LIGHT HANDLE (MISCELLANEOUS) ×2 IMPLANT
DERMABOND ADVANCED (GAUZE/BANDAGES/DRESSINGS) ×1
DERMABOND ADVANCED .7 DNX12 (GAUZE/BANDAGES/DRESSINGS) ×1 IMPLANT
DRAPE HIP W/POCKET STRL (MISCELLANEOUS) ×2 IMPLANT
DRAPE INCISE IOBAN 66X45 STRL (DRAPES) ×2 IMPLANT
DRAPE INCISE IOBAN 85X60 (DRAPES) ×2 IMPLANT
DRAPE POUCH INSTRU U-SHP 10X18 (DRAPES) ×2 IMPLANT
DRAPE SHEET LG 3/4 BI-LAMINATE (DRAPES) ×6 IMPLANT
DRAPE SURG 17X11 SM STRL (DRAPES) ×2 IMPLANT
DRAPE U-SHAPE 47X51 STRL (DRAPES) ×2 IMPLANT
DRESSING AQUACEL AG SP 3.5X10 (GAUZE/BANDAGES/DRESSINGS) ×1 IMPLANT
DRSG AQUACEL AG SP 3.5X10 (GAUZE/BANDAGES/DRESSINGS) ×2
ELECT BLADE TIP CTD 4 INCH (ELECTRODE) ×2 IMPLANT
ELECT REM PT RETURN 15FT ADLT (MISCELLANEOUS) ×2 IMPLANT
GLOVE BIOGEL PI IND STRL 8 (GLOVE) ×2 IMPLANT
GLOVE BIOGEL PI INDICATOR 8 (GLOVE) ×2
GLOVE SURG LX 8.0 MICRO (GLOVE) ×2
GLOVE SURG LX STRL 8.0 MICRO (GLOVE) ×2 IMPLANT
GLOVE SURG ORTHO 8.0 STRL STRW (GLOVE) ×2 IMPLANT
GOWN STRL REUS W/ TWL XL LVL3 (GOWN DISPOSABLE) ×1 IMPLANT
GOWN STRL REUS W/TWL XL LVL3 (GOWN DISPOSABLE) ×2
HANDPIECE INTERPULSE COAX TIP (DISPOSABLE) ×2
HEAD BIOLOX HIP 36/-2.5 (Joint) IMPLANT
HIP BIOLOX HD 36/-2.5 (Joint) ×2 IMPLANT
HOLDER FOLEY CATH W/STRAP (MISCELLANEOUS) ×2 IMPLANT
HOOD PEEL AWAY FLYTE STAYCOOL (MISCELLANEOUS) ×6 IMPLANT
INSERT 0 DEGREE 36 (Miscellaneous) ×1 IMPLANT
KIT BASIN OR (CUSTOM PROCEDURE TRAY) ×2 IMPLANT
KIT TURNOVER KIT A (KITS) ×1 IMPLANT
MANIFOLD NEPTUNE II (INSTRUMENTS) ×2 IMPLANT
MARKER SKIN DUAL TIP RULER LAB (MISCELLANEOUS) ×2 IMPLANT
NEEDLE HYPO 22GX1.5 SAFETY (NEEDLE) IMPLANT
NS IRRIG 1000ML POUR BTL (IV SOLUTION) ×2 IMPLANT
PACK TOTAL JOINT (CUSTOM PROCEDURE TRAY) ×2 IMPLANT
PRESSURIZER FEMORAL UNIV (MISCELLANEOUS) ×1 IMPLANT
PROTECTOR NERVE ULNAR (MISCELLANEOUS) ×2 IMPLANT
RETRIEVER SUT HEWSON (MISCELLANEOUS) ×2 IMPLANT
SCREW HEX LP 6.5X20 (Screw) ×1 IMPLANT
SCREW HEX LP 6.5X30 (Screw) ×1 IMPLANT
SEALER BIPOLAR AQUA 6.0 (INSTRUMENTS) ×2 IMPLANT
SET HNDPC FAN SPRY TIP SCT (DISPOSABLE) IMPLANT
SHELL TRIDENT II CLUST 50 (Shell) ×1 IMPLANT
SOLUTION IRRIG SURGIPHOR (IV SOLUTION) ×1 IMPLANT
SPIKE FLUID TRANSFER (MISCELLANEOUS) ×6 IMPLANT
STEM HIP ACCOLADE SZ4 35X137 (Stem) ×1 IMPLANT
SUCTION FRAZIER HANDLE 12FR (TUBING) ×2
SUCTION TUBE FRAZIER 12FR DISP (TUBING) ×1 IMPLANT
SUT BONE WAX W31G (SUTURE) ×2 IMPLANT
SUT ETHIBOND #5 BRAIDED 30INL (SUTURE) ×2 IMPLANT
SUT MNCRL AB 3-0 PS2 18 (SUTURE) ×2 IMPLANT
SUT STRATAFIX 0 PDS 27 VIOLET (SUTURE) ×2
SUT STRATAFIX PDO 1 14 VIOLET (SUTURE) ×2
SUT STRATFX PDO 1 14 VIOLET (SUTURE) ×1
SUT VIC AB 1 CT1 36 (SUTURE) ×1 IMPLANT
SUT VIC AB 2-0 CT2 27 (SUTURE) ×4 IMPLANT
SUTURE STRATFX 0 PDS 27 VIOLET (SUTURE) ×1 IMPLANT
SUTURE STRATFX PDO 1 14 VIOLET (SUTURE) ×1 IMPLANT
SYR 20ML LL LF (SYRINGE) ×4 IMPLANT
TOWEL OR 17X26 10 PK STRL BLUE (TOWEL DISPOSABLE) ×3 IMPLANT
TOWER CARTRIDGE SMART MIX (DISPOSABLE) ×1 IMPLANT
TRAY FOLEY MTR SLVR 16FR STAT (SET/KITS/TRAYS/PACK) ×2 IMPLANT
UNDERPAD 30X36 HEAVY ABSORB (UNDERPADS AND DIAPERS) ×2 IMPLANT
WATER STERILE IRR 1000ML POUR (IV SOLUTION) ×4 IMPLANT

## 2021-12-02 NOTE — Transfer of Care (Signed)
Immediate Anesthesia Transfer of Care Note  Patient: Peggy [name]  Procedure(s) Performed: TOTAL HIP ARTHROPLASTY (Right: Hip)  Patient Location: PACU  Anesthesia Type:General  Level of Consciousness: awake, alert , oriented and patient cooperative  Airway & Oxygen Therapy: Patient Spontanous Breathing and Patient connected to face mask oxygen  Post-op Assessment: Report given to RN and Post -op Vital signs reviewed and stable  Post vital signs: Reviewed and stable  Last Vitals:  Vitals Value Taken Time  BP 147/109 12/02/21 1708  Temp    Pulse 72 12/02/21 1710  Resp 17 12/02/21 1710  SpO2 100 % 12/02/21 1710  Vitals shown include unvalidated device data.  Last Pain:  Vitals:   12/02/21 1344  TempSrc: Oral  PainSc:       Patients Stated Pain Goal: 2 (52/17/47 1595)  Complications: No notable events documented.

## 2021-12-02 NOTE — Op Note (Signed)
12/02/2021  4:35 PM  PATIENT:  Peggy [name]   MRN: [id number]  PRE-OPERATIVE DIAGNOSIS: Right displaced femoral neck fracture, hip osteoarthritis  POST-OPERATIVE DIAGNOSIS:  same  PROCEDURE:  Procedure(s): Right total hip arthroplasty  PREOPERATIVE INDICATIONS:    BEUNA BOLDING is an 88 y.o. female who has a diagnosis of right displaced femoral neck fracture and elected for surgical management after failing conservative treatment.  The risks benefits and alternatives were discussed with the patient including but not limited to the risks of nonoperative treatment, versus surgical intervention including infection, bleeding, nerve injury, periprosthetic fracture, the need for revision surgery, dislocation, leg length discrepancy, blood clots, cardiopulmonary complications, morbidity, mortality, among others, and they were willing to proceed.     OPERATIVE REPORT     SURGEON:  Charlies Constable, MD    ASSISTANT: Izola Price, RNFA, (Present throughout the entire procedure,  necessary for completion of procedure in a timely manner, assisting with retraction, instrumentation, and closure)     ANESTHESIA: General  ESTIMATED BLOOD LOSS: 782NF    COMPLICATIONS:  None.     UNIQUE ASPECTS OF THE CASE: Moderate acetabular erosion along the superior acetabulum concerned that she would have continued pain or issues with a hemiarthroplasty elected for total hip arthroplasty  COMPONENTS:   Stryker Trident 2 50 mm acetabular shell, Accolade C size 4 cemented stem, 36 -2.72m ceramic femoral head, neutral polyethylene liner Implant Name Type Inv. Item Serial No. Manufacturer Lot No. LRB No. Used Action  CEMENT BONE SIMPLEX SPEEDSET - [id number] CEMENT BONE SIMPLEX SPEEDSET  [employer] [id number] Right 2 Implanted  CEMENT RESTRICTOR BONE PREP ST - [id number] CEMENT RESTRICTOR BONE PREP ST  [employer]UM[employer] [id number] 1 Implanted  SHELL TRIDENT II CLUST 50 -  [id number] SHELL TRIDENT II CLUST 50  [employer] [id number] Right 1 Implanted  SCREW HEX LP 6.5X30 - [id number] SCREW HEX LP 6.5X30  [employer] [id number] Right 1 Implanted  SCREW HEX LP 6.5X20 - [id number] SCREW HEX LP 6.5X20  [employer] [id number] Right 1 Implanted  INSERT 0 DEGREE 36 - [id number] INSERT 0 DEGREE 36  [employer] [id number] Right 1 Implanted  STEM HIP ACCOLADE SZ4 35X137 - [id number] STEM HIP ACCOLADE SZ4 35X137  [employer] [id number] Right 1 Implanted  HIP BIOLOX HD 36/-2.5 - [id number] HIP BIOLOX HD 36/-2.5  [employer] [id number] 1 Implanted      PROCEDURE IN DETAIL:   The patient was met in the holding area and  identified.  The appropriate hip was identified and marked at the operative site.  The patient was then transported to the OR  and  placed under anesthesia.  At that point, the patient was  placed in the lateral decubitus position with the operative side up and  secured to the operating room table  and all bony prominences padded. A subaxillary role was also placed.    The operative lower extremity was prepped from the iliac crest to the distal leg.  Sterile draping was performed.  Time out was performed prior to incision.      A routine posterolateral approach was utilized via sharp dissection  carried down to the subcutaneous tissue.  Gross bleeders were Bovie coagulated.  The iliotibial band was identified and incised along the length of the skin incision through the glute max fascia.  Charnley retractor was placed with care to protect the sciatic nerve posteriorly.  With the hip internally rotated, the piriformis  tendon was identified and released from the femoral insertion and tagged with a #5 Ethibond.  A capsulotomy was then performed off the femoral insertion and also tagged with a #5 Ethibond.    The femoral neck was exposed, and I resected the femoral neck just below the femoral neck  fracture measuring about 15 mm from the lesser trochanter.    I then exposed the deep acetabulum, cleared out any tissue including the ligamentum teres.  After adequate visualization, I excised the labrum.  I then started reaming with a 46 mm reamer, first medializing to the floor of the cotyloid fossa, and then in the position of the cup aiming towards the greater sciatic notch, matching the version of the transverse acetabular ligament and tucked under the anterior wall. I reamed up to 50 mm reamer with good bony bed preparation and a 50 mm cup was chosen.  The real cup was then impacted into place.  Appropriate version and inclination was confirmed clinically matching their bony anatomy, and also with the use of the jig.  I placed 2 screws in the posterior superior quadrant to augment fixation.  A neutral liner was placed and impacted. It was confirmed to be appropriately seated and the acetabular retractors were removed.    I then prepared the proximal femur using the box cutter, Charnley awl, and then sequentially broached starting with 2 up to a size 4.  A trial broach, neck, and head was utilized, and I reduced the hip and it was found to have excellent stability.  There was no impingement with full extension and 90 degrees external rotation.  The hip was stable at the position of sleep and with 90 degrees flexion and 90 degrees of internal rotation.  Leg lengths were also clinically assessed in the lateral position and felt to be equal.  We then prepared canal for cementation.  The cement restrictor was measured and inserted distally.  The canal was then irrigated with the pulse lavage and 3 L of normal saline.  2 bags of Simplex cement were prepared.  Using the cement gun the cement was inserted distally and the canal was filled.  We then pressurized the canal. The real implant was then inserted matching the patient's native anteversion of approximately 25 degrees.  We then waited for 13 minutes  for the cement to be fully set.  Excess cement was removed.  A lap was placed in the acetabulum prior to cementing was also removed and the acetabulum was assessed to make sure there was no cement or bone fragments.  .I again trialed and selected a 36 -2.104m ball. The hip was then reduced and taken through a range of motion. There was no impingement with full extension and 90 degrees external rotation.  The hip was stable at the position of sleep and with 90 degrees flexion and 90 degrees of internal rotation. Leg lengths were  again assessed and felt to be restored.  We then opened, and I impacted the real head ball into place.  The posterior capsule was then closed with #5 Ethibond.  The piriformis was repaired through the base of the abductor tendon using a [place] suture passer.  I then irrigated the hip copiously again with pulse lavage. Periarticular injection was then performed with Exparel.  Intraop flat plate xray was obtained and components were confirmed to be in good position without fracture. We repaired the fascia #1 barbed suture, followed by 0 barbed suture for the subcutaneous fat.  Skin was  closed with 2-0 Vicryl and 3-0 Monocryl.  Dermabond and Aquacel dressing were applied. The patient was then awakened and returned to PACU in stable and satisfactory condition.  Leg lengths in the supine position were assessed and felt to be clinically equal. There were no complications.  Post op recs: WB: WBAT RLE, No formal hip precautions Abx: ancef x23 hours post op Imaging: PACU pelvis Xray Dressing: Aquacell, keep intact until follow up DVT prophylaxis: Aspirin 81BID starting POD1 Follow up: 2 weeks after surgery for a wound check with Dr. Zachery Dakins at [facility].  Address: [address], [place], [place] [address]  Office Phone: [phone]   Charlies Constable, MD Orthopedic Surgeon

## 2021-12-02 NOTE — Anesthesia Preprocedure Evaluation (Addendum)
Anesthesia Evaluation  Patient identified by MRN, date of birth, ID band Patient awake    Reviewed: Allergy & Precautions, NPO status , Patient's Chart, lab work & pertinent test results  Airway Mallampati: II  TM Distance: >3 FB Neck ROM: Full    Dental  (+) Teeth Intact, Dental Advisory Given   Pulmonary former smoker,    breath sounds clear to auscultation       Cardiovascular hypertension, Pt. on medications  Rhythm:Regular Rate:Normal     Neuro/Psych negative neurological ROS  negative psych ROS   GI/Hepatic Neg liver ROS, GERD  Medicated,  Endo/Other  negative endocrine ROS  Renal/GU Renal disease     Musculoskeletal  (+) Arthritis ,   Abdominal Normal abdominal exam  (+)   Peds  Hematology negative hematology ROS (+)   Anesthesia Other Findings   Reproductive/Obstetrics                            Anesthesia Physical Anesthesia Plan  ASA: 3  Anesthesia Plan: General   Post-op Pain Management:    Induction: Intravenous  PONV Risk Score and Plan: 4 or greater and Ondansetron and Treatment may vary due to age or medical condition  Airway Management Planned: Oral ETT  Additional Equipment: None  Intra-op Plan:   Post-operative Plan: Extubation in OR  Informed Consent: I have reviewed the patients History and Physical, chart, labs and discussed the procedure including the risks, benefits and alternatives for the proposed anesthesia with the patient or authorized representative who has indicated his/her understanding and acceptance.   Patient has DNR.  Discussed DNR with patient and Suspend DNR.   Dental advisory given  Plan Discussed with: CRNA  Anesthesia Plan Comments: (Lab Results      Component                Value               Date                      WBC                      14.3 (H)            12/02/2021                HGB                      10.9 (L)             12/02/2021                HCT                      33.5 (L)            12/02/2021                MCV                      98.0                12/02/2021                PLT                      237  12/02/2021           )      Anesthesia Quick Evaluation

## 2021-12-02 NOTE — Progress Notes (Signed)
PHARMACY NOTE:  ANTIMICROBIAL RENAL DOSAGE ADJUSTMENT  Current antimicrobial regimen includes a mismatch between antimicrobial dosage and estimated renal function.  As per policy approved by the Pharmacy & Therapeutics and Medical Executive Committees, the antimicrobial dosage will be adjusted accordingly.  Current antimicrobial dosage:  Ancef 2gm IV q8h x 2 doses post-op  Indication: surgical prophylaxis  Renal Function:  Estimated Creatinine Clearance: 20.8 mL/min (A) (by C-G formula based on SCr of 1.48 mg/dL (H)). []      On intermittent HD, scheduled: []      On CRRT    Antimicrobial dosage has been changed to:  Ancef 2gm IV q12h x 1 dose post-op  Additional comments:   Thank you for allowing pharmacy to be a part of this patient's care.  Netta Cedars, [facility] 12/02/2021 6:20 PM

## 2021-12-02 NOTE — Discharge Instructions (Signed)

## 2021-12-02 NOTE — Anesthesia Postprocedure Evaluation (Deleted)
Anesthesia Post Note  Patient: Peggy [name]  Procedure(s) Performed: TOTAL HIP ARTHROPLASTY (Right: Hip)     Patient location during evaluation: PACU Anesthesia Type: General Level of consciousness: awake and alert Pain management: pain level controlled Vital Signs Assessment: post-procedure vital signs reviewed and stable Respiratory status: spontaneous breathing, nonlabored ventilation, respiratory function stable and patient connected to nasal cannula oxygen Cardiovascular status: blood pressure returned to baseline and stable Postop Assessment: no apparent nausea or vomiting Anesthetic complications: no   No notable events documented.  Last Vitals:  Vitals:   12/02/21 1800 12/02/21 1812  BP: (!) 155/84 (!) 149/84  Pulse: 74 74  Resp: 14 16  Temp: 37.1 C 37.3 C  SpO2: 98% 97%    Last Pain:  Vitals:   12/02/21 1812  TempSrc: Oral  PainSc: 0-No pain                 Santa Lighter

## 2021-12-02 NOTE — Interval H&P Note (Signed)
The patient has been re-examined, and the chart reviewed, and there have been no interval changes to the documented history and physical.    Plan for right hip hemiarthroplasty versus total hip arthroplasty. The patient is a Hydrographic surveyor with a cane.  She is very active enjoys walking participates in water aerobics.  She fractured her hip when she fell at the pool side.  Preoperative x-rays demonstrate moderate arthritic changes already of the bilateral hips.  She denies any prior groin or hip pain before the fracture.  Discussed with the patient and family that if intraoperatively there is severe wear on acetabular side would recommend total hip arthroplasty; however, if acetabular cartilage is preserved feels to be a good candidate for hip hemiarthroplasty.  The operative side was examined and the patient was confirmed to have. Sens DPN, SPN, TN intact, Motor EHL, ext, flex 5/5, and DP 2+, PT 2+, No significant edema.   The risks, benefits, and alternatives have been discussed at length with patient, and the patient is willing to proceed.  Right hip marked. Consent has been signed.

## 2021-12-02 NOTE — Progress Notes (Signed)
PROGRESS NOTE    Peggy [name]  UTM:[id number] DOB: 03-15-1929 DOA: 11/30/2021 PCP: Mast, Man X, NP   Brief Narrative:  88 year old with history of HTN, HLD admitted to the hospital after sustaining a mechanical fall at a water exercise program in the pool and slipping on the wet floor.  X-ray showed right femoral neck fracture.   Assessment & Plan:  Principal Problem:   Closed right hip fracture (HCC) Active Problems:   Hypertension   CKD (chronic kidney disease) stage 4, GFR 15-29 ml/min (HCC)   DNR (do not resuscitate)   Protein-calorie malnutrition, severe    Right hip fracture status post mechanical fall. -X-ray showed hip fracture.  Orthopedic consulted who plans on surgical intervention under fascia iliac as block per anesthesia.  Postop chemoprophylaxis per orthopedic along with pain management. - PT/OT postop   Essential HTN -On Norvasc.  IV as needed ordered   Stage 4 CKD -Home valsartan and Lasix on hold   DVT prophylaxis: SCDs, postop per orthopedic Code Status: DNR Family Communication:  Called [name] updated.   Status is: Inpatient Remains inpatient appropriate because: Plans for OR for her right hip fracture today.   Nutritional status  Signs/Symptoms: severe muscle depletion, severe fat depletion  Interventions: Ensure Enlive (each supplement provides 350kcal and 20 grams of protein), MVI  Body mass index is 19.74 kg/m.      Subjective: Seen and examined at bedside, patient does not appear to be any acute distress.  She denies any right hip pain at the moment.  Denies any chest pain, shortness of breath, nausea, vomiting or other complaints.  Examination:  General exam: Appears calm and comfortable, elderly frail Respiratory system: Clear to auscultation. Respiratory effort normal. Cardiovascular system: S1 & S2 heard, RRR. No JVD, murmurs, rubs, gallops or clicks. No pedal edema. Gastrointestinal system: Abdomen is nondistended, soft  and nontender. No organomegaly or masses felt. Normal bowel sounds heard. Central nervous system: Alert and oriented. No focal neurological deficits. Extremities: Limited range of motion especially right lower extremity due to the discomfort upon movement. Skin: No rashes, lesions or ulcers Psychiatry: Judgement and insight appear normal. Mood & affect appropriate.     Objective: Vitals:   12/01/21 1324 12/01/21 1942 12/02/21 0309 12/02/21 0632  BP: (!) 143/67 139/87 (!) 143/85 121/71  Pulse: 81 93 87 83  Resp: 16 18 17 18   Temp: 99.1 F (37.3 C) 98.5 F (36.9 C) 99.4 F (37.4 C) 97.8 F (36.6 C)  TempSrc: Oral Oral Oral Oral  SpO2: 96% 94% 96% 96%  Weight:      Height:        Intake/Output Summary (Last 24 hours) at 12/02/2021 0838 Last data filed at 12/02/2021 0600 Gross per 24 hour  Intake 780 ml  Output --  Net 780 ml   Filed Weights   11/30/21 1140  Weight: 52.2 kg     Data Reviewed:   CBC: Recent Labs  Lab 11/30/21 1149 12/01/21 0449 12/02/21 0353  WBC 7.0 9.2 14.3*  NEUTROABS 5.0  --   --   HGB 10.5* 10.2* 10.9*  HCT 32.7* 31.5* 33.5*  MCV 98.8 98.7 98.0  PLT 231 214 568   Basic Metabolic Panel: Recent Labs  Lab 11/30/21 1149 12/01/21 0449 12/02/21 0353  NA 139 137 135  K 4.1 3.6 3.4*  CL 103 103 100  CO2 25 24 24   GLUCOSE 93 103* 165*  BUN 29* 26* 33*  CREATININE 1.65* 1.48* 1.48*  CALCIUM 10.1  9.5 9.7  MG  --   --  2.1   GFR: Estimated Creatinine Clearance: 20.8 mL/min (A) (by C-G formula based on SCr of 1.48 mg/dL (H)). Liver Function Tests: No results for input(s): "AST", "ALT", "ALKPHOS", "BILITOT", "PROT", "ALBUMIN" in the last 168 hours. No results for input(s): "LIPASE", "AMYLASE" in the last 168 hours. No results for input(s): "AMMONIA" in the last 168 hours. Coagulation Profile: Recent Labs  Lab 11/30/21 1149  INR 1.0   Cardiac Enzymes: No results for input(s): "CKTOTAL", "CKMB", "CKMBINDEX", "TROPONINI" in the last 168  hours. BNP (last 3 results) No results for input(s): "PROBNP" in the last 8760 hours. HbA1C: No results for input(s): "HGBA1C" in the last 72 hours. CBG: No results for input(s): "GLUCAP" in the last 168 hours. Lipid Profile: No results for input(s): "CHOL", "HDL", "LDLCALC", "TRIG", "CHOLHDL", "LDLDIRECT" in the last 72 hours. Thyroid Function Tests: No results for input(s): "TSH", "T4TOTAL", "FREET4", "T3FREE", "THYROIDAB" in the last 72 hours. Anemia Panel: No results for input(s): "VITAMINB12", "FOLATE", "FERRITIN", "TIBC", "IRON", "RETICCTPCT" in the last 72 hours. Sepsis Labs: No results for input(s): "PROCALCITON", "LATICACIDVEN" in the last 168 hours.  Recent Results (from the past 240 hour(s))  Surgical PCR screen     Status: Abnormal   Collection Time: 12/01/21  8:15 PM   Specimen: Nasal Mucosa; Nasal Swab  Result Value Ref Range Status   MRSA, PCR NEGATIVE NEGATIVE Final   Staphylococcus aureus POSITIVE (A) NEGATIVE Final    Comment: (NOTE) The Xpert SA Assay (FDA approved for NASAL specimens in patients 30 years of age and older), is one component of a comprehensive surveillance program. It is not intended to diagnose infection nor to guide or monitor treatment. Performed at [facility], [place] [address], [place], [place] [address]          Radiology Studies: DG Chest 1 View  Result Date: 11/30/2021 CLINICAL DATA:  Fall.  Concern for hip fracture. EXAM: CHEST  1 VIEW COMPARISON:  Radiographs 06/08/2016. FINDINGS: 1303 hours. Patient rotation to the right. The heart size and mediastinal contours are stable with cardiomegaly, aortic tortuosity and a small hiatal hernia. The lungs appear clear. No pleural effusion or pneumothorax. No acute osseous findings are seen in the chest. Telemetry leads overlie the chest. IMPRESSION: No evidence of acute chest injury or active cardiopulmonary process. Electronically Signed   By: Richardean Sale M.D.   On:  11/30/2021 13:22   DG Hip Unilat With Pelvis 2-3 Views Right  Result Date: 11/30/2021 CLINICAL DATA:  Fall onto right hip.  Concern for hip fracture. EXAM: DG HIP (WITH OR WITHOUT PELVIS) 2-3V RIGHT COMPARISON:  None Available. FINDINGS: The bones are demineralized. There is an acute, mildly displaced fracture of the right femoral neck. No evidence of acute pelvic fracture or dislocation. Underlying mild degenerative changes of both hips. Central pelvic calcification is likely a phlebolith. Lower lumbar spondylosis noted. IMPRESSION: Acute mildly displaced fracture of the right femoral neck. Electronically Signed   By: Richardean Sale M.D.   On: 11/30/2021 13:18        Scheduled Meds:  amLODipine  5 mg Oral Daily   bacitracin   Topical BID   Chlorhexidine Gluconate Cloth  6 each Topical Daily   docusate sodium  100 mg Oral BID   feeding supplement  237 mL Oral TID BM   mirabegron ER  50 mg Oral QHS   multivitamin with minerals  1 tablet Oral Daily   mupirocin ointment  1 Application Nasal BID   pantoprazole  40 mg Oral QAC breakfast   Continuous Infusions:  methocarbamol (ROBAXIN) IV       LOS: 2 days   Time spent= 35 mins    Daeron Carreno Arsenio Loader, MD [facility]  If 7PM-7AM, please contact night-coverage  12/02/2021, 8:38 AM

## 2021-12-02 NOTE — Plan of Care (Signed)
  Problem: Education: Goal: Knowledge of General Education information will improve Description: Including pain rating scale, medication(s)/side effects and non-pharmacologic comfort measures Outcome: Progressing   Problem: Activity: Goal: Risk for activity intolerance will decrease Outcome: Progressing   Problem: Pain Managment: Goal: General experience of comfort will improve Outcome: Progressing   

## 2021-12-02 NOTE — Anesthesia Postprocedure Evaluation (Signed)
Anesthesia Post Note  Patient: Peggy [name]  Procedure(s) Performed: TOTAL HIP ARTHROPLASTY (Right: Hip)     Patient location during evaluation: PACU Anesthesia Type: General Level of consciousness: awake and alert Pain management: pain level controlled Vital Signs Assessment: post-procedure vital signs reviewed and stable Respiratory status: spontaneous breathing, nonlabored ventilation, respiratory function stable and patient connected to nasal cannula oxygen Cardiovascular status: blood pressure returned to baseline and stable Postop Assessment: no apparent nausea or vomiting Anesthetic complications: no   No notable events documented.  Last Vitals:  Vitals:   12/02/21 1800 12/02/21 1812  BP: (!) 155/84 (!) 149/84  Pulse: 74 74  Resp: 14 16  Temp: 37.1 C 37.3 C  SpO2: 98% 97%    Last Pain:  Vitals:   12/02/21 1812  TempSrc: Oral  PainSc: 0-No pain                 Santa Lighter

## 2021-12-02 NOTE — Anesthesia Procedure Notes (Signed)
Procedure Name: Intubation Date/Time: 12/02/2021 3:01 PM  Performed by: West Pugh, CRNAPre-anesthesia Checklist: Patient identified, Emergency Drugs available, Suction available, Patient being monitored and Timeout performed Patient Re-evaluated:Patient Re-evaluated prior to induction Oxygen Delivery Method: Circle system utilized Preoxygenation: Pre-oxygenation with 100% oxygen Induction Type: IV induction Ventilation: Mask ventilation without difficulty Laryngoscope Size: Mac and 3 Grade View: Grade I Tube type: Oral Tube size: 7.0 mm Number of attempts: 1 Airway Equipment and Method: Stylet Placement Confirmation: ETT inserted through vocal cords under direct vision, positive ETCO2, CO2 detector and breath sounds checked- equal and bilateral Secured at: 21 cm Tube secured with: Tape Dental Injury: Teeth and Oropharynx as per pre-operative assessment

## 2021-12-03 ENCOUNTER — Encounter (HOSPITAL_COMMUNITY): Payer: Self-pay | Admitting: Orthopedic Surgery

## 2021-12-03 DIAGNOSIS — S72001A Fracture of unspecified part of neck of right femur, initial encounter for closed fracture: Secondary | ICD-10-CM | POA: Diagnosis not present

## 2021-12-03 DIAGNOSIS — N184 Chronic kidney disease, stage 4 (severe): Secondary | ICD-10-CM | POA: Diagnosis not present

## 2021-12-03 DIAGNOSIS — Z66 Do not resuscitate: Secondary | ICD-10-CM | POA: Diagnosis not present

## 2021-12-03 DIAGNOSIS — I10 Essential (primary) hypertension: Secondary | ICD-10-CM | POA: Diagnosis not present

## 2021-12-03 LAB — CBC
HCT: 26.5 % — ABNORMAL LOW (ref 36.0–46.0)
Hemoglobin: 8.7 g/dL — ABNORMAL LOW (ref 12.0–15.0)
MCH: 32.1 pg (ref 26.0–34.0)
MCHC: 32.8 g/dL (ref 30.0–36.0)
MCV: 97.8 fL (ref 80.0–100.0)
Platelets: 172 10*3/uL (ref 150–400)
RBC: 2.71 MIL/uL — ABNORMAL LOW (ref 3.87–5.11)
RDW: 13.4 % (ref 11.5–15.5)
WBC: 12.4 10*3/uL — ABNORMAL HIGH (ref 4.0–10.5)
nRBC: 0 % (ref 0.0–0.2)

## 2021-12-03 LAB — BASIC METABOLIC PANEL
Anion gap: 7 (ref 5–15)
BUN: 28 mg/dL — ABNORMAL HIGH (ref 8–23)
CO2: 25 mmol/L (ref 22–32)
Calcium: 9 mg/dL (ref 8.9–10.3)
Chloride: 103 mmol/L (ref 98–111)
Creatinine, Ser: 1.38 mg/dL — ABNORMAL HIGH (ref 0.44–1.00)
GFR, Estimated: 36 mL/min — ABNORMAL LOW (ref >60)
Glucose, Bld: 126 mg/dL — ABNORMAL HIGH (ref 70–99)
Potassium: 3.9 mmol/L (ref 3.5–5.1)
Sodium: 135 mmol/L (ref 135–145)

## 2021-12-03 LAB — MAGNESIUM: Magnesium: 1.8 mg/dL (ref 1.7–2.4)

## 2021-12-03 MED ORDER — SODIUM CHLORIDE 0.9 % IV SOLN: INTRAVENOUS | Status: DC

## 2021-12-03 NOTE — Progress Notes (Signed)
Physical Therapy Treatment Patient Details Name: Peggy [name] MRN: [id number] DOB: September 11, 1928 Today's Date: 12/03/2021   History of Present Illness Pt s/p fall with R hip fx and now s/p R THR by posterior approach (no formal precautions).  Pt with hx of CKD and R ankle fx 8/22.    PT Comments    Pt continues cooperative but progressing slowly with mobility and with limited activity tolerance.  Pt would benefit from follow up rehab at SNF level to maximize IND and safety.   Recommendations for follow up therapy are one component of a multi-disciplinary discharge planning process, led by the attending physician.  Recommendations may be updated based on patient status, additional functional criteria and insurance authorization.  Follow Up Recommendations  Skilled nursing-short term rehab (<3 hours/day) Can patient physically be transported by private vehicle: No   Assistance Recommended at Discharge Frequent or constant Supervision/Assistance  Patient can return home with the following A lot of help with walking and/or transfers;A little help with bathing/dressing/bathroom;Assistance with cooking/housework;Assist for transportation;Help with stairs or ramp for entrance   Equipment Recommendations  None recommended by PT    Recommendations for Other Services       Precautions / Restrictions Precautions Precautions: Fall Restrictions Weight Bearing Restrictions: No RLE Weight Bearing: Weight bearing as tolerated     Mobility  Bed Mobility Overal bed mobility: Needs Assistance Bed Mobility: Sit to Supine     Supine to sit: Mod assist, +2 for physical assistance, +2 for safety/equipment Sit to supine: +2 for physical assistance, +2 for safety/equipment, Mod assist   General bed mobility comments: Increased time, cues for sequence and use of L LE to self assist.  Physical assist to manage R LE, control trunk and complete rotation using bed pad    Transfers Overall  transfer level: Needs assistance Equipment used: Rolling walker (2 wheels) Transfers: Sit to/from Stand Sit to Stand: Mod assist, +2 physical assistance, +2 safety/equipment, From elevated surface           General transfer comment: cues for LE management and use of UEs to self assist    Ambulation/Gait Ambulation/Gait assistance: Min assist, Mod assist, +2 physical assistance, +2 safety/equipment Gait Distance (Feet): 8 Feet Assistive device: Rolling walker (2 wheels) Gait Pattern/deviations: Step-to pattern, Decreased step length - right, Decreased step length - left, Decreased stance time - right, Trunk flexed, Shuffle Gait velocity: decr     General Gait Details: Increased time with cues for sequence, posture and position from [employer]             Wheelchair Mobility    Modified Rankin (Stroke Patients Only)       Balance Overall balance assessment: Needs assistance Sitting-balance support: Feet supported, No upper extremity supported Sitting balance-Leahy Scale: Fair     Standing balance support: Bilateral upper extremity supported Standing balance-Leahy Scale: Poor                              Cognition Arousal/Alertness: Awake/alert Behavior During Therapy: WFL for tasks assessed/performed Overall Cognitive Status: Within Functional Limits for tasks assessed                                          Exercises      General Comments        Pertinent Vitals/Pain  Pain Assessment Pain Assessment: Faces Faces Pain Scale: Hurts even more Pain Location: R hip with WB Pain Descriptors / Indicators: Aching, Sore, Grimacing Pain Intervention(s): Limited activity within patient's tolerance, Monitored during session, Premedicated before session, Ice applied    Home Living Family/patient expects to be discharged to:: Unsure                   Additional Comments: Dtr reports likely rehab setting [facility]     Prior Function            PT Goals (current goals can now be found in the care plan section) Acute Rehab PT Goals Patient Stated Goal: Regain IND PT Goal Formulation: With patient Time For Goal Achievement: 12/10/21 Potential to Achieve Goals: Good Progress towards PT goals: Progressing toward goals    Frequency    Min 3X/week      PT Plan Current plan remains appropriate    Co-evaluation              AM-PAC PT "6 Clicks" Mobility   Outcome Measure  Help needed turning from your back to your side while in a flat bed without using bedrails?: A Lot Help needed moving from lying on your back to sitting on the side of a flat bed without using bedrails?: A Lot Help needed moving to and from a bed to a chair (including a wheelchair)?: A Lot Help needed standing up from a chair using your arms (e.g., wheelchair or bedside chair)?: A Lot Help needed to walk in hospital room?: Total Help needed climbing 3-5 steps with a railing? : Total 6 Click Score: 10    End of Session Equipment Utilized During Treatment: Gait belt Activity Tolerance: Patient limited by fatigue;Patient limited by pain Patient left: in bed;with call bell/phone within reach;with bed alarm set Nurse Communication: Mobility status PT Visit Diagnosis: Difficulty in walking, not elsewhere classified (R26.2);Muscle weakness (generalized) (M62.81);History of falling (Z91.81);Pain Pain - Right/Left: Right Pain - part of body: Hip     Time: 1345-1404 PT Time Calculation (min) (ACUTE ONLY): 19 min  Charges:  $Gait Training: 8-22 mins                     [place] Pager [phone] Office [phone]    [facility] 12/03/2021, 3:38 PM

## 2021-12-03 NOTE — Care Management Important Message (Signed)
Important Message  Patient Details IM Letter placed in Patients room. Name: Peggy [name] MRN: [id number] Date of Birth: February 18, 1929   Medicare Important Message Given:  Yes     Kerin Salen 12/03/2021, 12:56 PM

## 2021-12-03 NOTE — TOC Initial Note (Signed)
Transition of Care [facility]) - Initial/Assessment Note   Patient Details  Name: Peggy [name] MRN: [id number] Date of Birth: January 24, 1929  Transition of [facility]) CM/SW Contact:    Sherie Don, LCSW Phone Number: 12/03/2021, 1:19 PM  Clinical Narrative: PT evaluation recommended SNF. CSW spoke with Karlene Einstein at [facility] and confirmed there are no rehab beds available and will not be available for awhile.  CSW spoke with daughter, Mariane Burpee, who requested that CSW speak with patient's other daughter, Levora Dredge, as she is more familiar with SNFs.  CSW called Ms. Kaylyn Layer to discuss recommendations. Daughter expressed frustration that patient cannot return to [place] for rehab, but is agreeable to patient being referred out to other facilities. CSW provided both daughter's with the Medicare nursing home compare website information.  FL2 done; PASRR confirmed. Initial referral faxed out. TOC awaiting bed offers.  Expected Discharge Plan: Skilled Nursing Facility Barriers to Discharge: Continued Medical Work up  Patient Goals and CMS Choice Patient states their goals for this hospitalization and ongoing recovery are:: Go to short-term rehab CMS Medicare.gov Compare Post Acute Care list provided to:: Patient Represenative (must comment) Choice offered to / list presented to : Adult Children  Expected Discharge Plan and Services Expected Discharge Plan: [place] In-house Referral: Clinical Social Work Post Acute Care Choice: [place] Living arrangements for the past 2 months: [place]            DME Arranged: N/A DME Agency: NA  Prior Living Arrangements/Services Living arrangements for the past 2 months: [place] Lives with:: Facility Resident Patient language and need for interpreter reviewed:: Yes Do you feel safe going back to the place where you live?: Yes      Need for  Family Participation in Patient Care: Yes (Comment) Care giver support system in place?: Yes (comment) Criminal Activity/Legal Involvement Pertinent to Current Situation/Hospitalization: No - Comment as needed  Activities of Daily Living Home Assistive Devices/Equipment: Cane (specify quad or straight) ADL Screening (condition at time of admission) Patient's cognitive ability adequate to safely complete daily activities?: Yes Is the patient deaf or have difficulty hearing?: No Does the patient have difficulty seeing, even when wearing glasses/contacts?: No Does the patient have difficulty concentrating, remembering, or making decisions?: No Patient able to express need for assistance with ADLs?: Yes Does the patient have difficulty dressing or bathing?: No Independently performs ADLs?: Yes (appropriate for developmental age) Does the patient have difficulty walking or climbing stairs?: Yes Weakness of Legs: Both Weakness of Arms/Hands: None  Permission Sought/Granted Permission sought to share information with : Facility Art therapist granted to share information with : Yes, Verbal Permission Granted Permission granted to share info w AGENCY: SNFs  Emotional Assessment Orientation: : Oriented to Self, Oriented to Place, Oriented to  Time, Oriented to Situation Alcohol / Substance Use: Not Applicable Psych Involvement: No (comment)  Admission diagnosis:  Hip fracture ([place]) [S72.009A] Closed fracture of neck of right femur, initial encounter ([place]) [S72.001A] Patient Active Problem List   Diagnosis Date Noted   Protein-calorie malnutrition, severe 12/02/2021   Closed right hip fracture ([place]) 11/30/2021   DNR (do not resuscitate) 11/30/2021   Osteoarthritis, multiple sites 09/24/2021   UTI (urinary tract infection) 07/30/2021   Dry skin 06/18/2021   Open wound of left lower leg 04/30/2021   Boxer's fracture 02/04/2021   Closed right ankle fracture 12/25/2020    Noninfected skin tear of leg, left, subsequent  encounter 12/25/2020   Rash 12/23/2020   Osteoporosis 11/28/2020   Slow transit constipation 02/21/2020   Gait abnormality 08/31/2018   Fecal incontinence 08/31/2018   Loose stools 08/31/2018   Right shoulder pain 03/30/2018   Closed nondisp fracture of right medial malleolus with routine healing 11/10/2017   Anemia 11/10/2017   CKD (chronic kidney disease) stage 4, GFR 15-29 ml/min (HCC) 10/11/2017   Venous insufficiency (chronic) (peripheral) 08/10/2017   Incontinent of urine 08/10/2017   Malnutrition of moderate degree 05/19/2016   Hypertension 05/18/2016   PCP:  Mast, Man X, NP Pharmacy:   [place], [place] [address] Phone: [phone] Fax: [phone]  Readmission Risk Interventions     No data to display

## 2021-12-03 NOTE — Progress Notes (Signed)
PROGRESS NOTE    Peggy [name]  PYK:[id number] DOB: [birth year] DOA: 11/30/2021 PCP: Mast, Man X, NP   Brief Narrative:  88 year old with history of HTN, HLD admitted to the hospital after sustaining a mechanical fall at a water exercise program in the pool and slipping on the wet floor.  X-ray showed right femoral neck fracture.  Patient underwent right total hip arthroplasty on 6/21 by orthopedic.  PT/OT ordered.   Assessment & Plan:  Principal Problem:   Closed right hip fracture (HCC) Active Problems:   Hypertension   CKD (chronic kidney disease) stage 4, GFR 15-29 ml/min (HCC)   DNR (do not resuscitate)   Protein-calorie malnutrition, severe    Right hip fracture status post mechanical fall status post right total hip arthroplasty 6/21 -X-ray showed hip fracture.  Seen by orthopedic status post ORIF.  Weightbearing as tolerated.  Dressing recommendations-Aquacel to be kept intact until outpatient follow-up.  On aspirin 81 mg twice daily. - PT/OT postop.    Essential HTN -On Norvasc.  IV as needed ordered   Stage 4 CKD -Home valsartan and Lasix on hold   DVT prophylaxis: Aspirin 81 mg twice daily Code Status: DNR Family Communication:  Called [name] updated.   Status is: Inpatient Remains inpatient appropriate because: PT/OT.  May require placement Nutritional status  Signs/Symptoms: severe muscle depletion, severe fat depletion  Interventions: Ensure Enlive (each supplement provides 350kcal and 20 grams of protein), MVI  Body mass index is 19.74 kg/m.      Subjective: Very pleasant, doing ok no complaints.   Examination: Constitutional: Not in acute distress Respiratory: Clear to auscultation bilaterally Cardiovascular: Normal sinus rhythm, no rubs Abdomen: Nontender nondistended good bowel sounds Musculoskeletal: No edema noted Skin: surgical site is ok Neurologic: CN 2-12 grossly intact.  And nonfocal Psychiatric: Normal judgment and insight.  Alert and oriented x 3. Normal mood.    Objective: Vitals:   12/02/21 1812 12/02/21 2135 12/03/21 0159 12/03/21 0620  BP: (!) 149/84 134/74 (!) 146/80 133/71  Pulse: 74 82 88 92  Resp: 16 17 17 17   Temp: 99.2 F (37.3 C) 98.5 F (36.9 C) 98.5 F (36.9 C) 100 F (37.8 C)  TempSrc: Oral Oral  Oral  SpO2: 97% 95% 97% 90%  Weight:      Height:        Intake/Output Summary (Last 24 hours) at 12/03/2021 0828 Last data filed at 12/03/2021 0631 Gross per 24 hour  Intake 1402.98 ml  Output 1700 ml  Net -297.02 ml   Filed Weights   11/30/21 1140  Weight: 52.2 kg     Data Reviewed:   CBC: Recent Labs  Lab 11/30/21 1149 12/01/21 0449 12/02/21 0353 12/03/21 0719  WBC 7.0 9.2 14.3* 12.4*  NEUTROABS 5.0  --   --   --   HGB 10.5* 10.2* 10.9* 8.7*  HCT 32.7* 31.5* 33.5* 26.5*  MCV 98.8 98.7 98.0 97.8  PLT 231 214 237 539   Basic Metabolic Panel: Recent Labs  Lab 11/30/21 1149 12/01/21 0449 12/02/21 0353 12/03/21 0352  NA 139 137 135 135  K 4.1 3.6 3.4* 3.9  CL 103 103 100 103  CO2 25 24 24 25   GLUCOSE 93 103* 165* 126*  BUN 29* 26* 33* 28*  CREATININE 1.65* 1.48* 1.48* 1.38*  CALCIUM 10.1 9.5 9.7 9.0  MG  --   --  2.1 1.8   GFR: Estimated Creatinine Clearance: 22.3 mL/min (A) (by C-G formula based on SCr of 1.38 mg/dL (  H)). Liver Function Tests: No results for input(s): "AST", "ALT", "ALKPHOS", "BILITOT", "PROT", "ALBUMIN" in the last 168 hours. No results for input(s): "LIPASE", "AMYLASE" in the last 168 hours. No results for input(s): "AMMONIA" in the last 168 hours. Coagulation Profile: Recent Labs  Lab 11/30/21 1149  INR 1.0   Cardiac Enzymes: No results for input(s): "CKTOTAL", "CKMB", "CKMBINDEX", "TROPONINI" in the last 168 hours. BNP (last 3 results) No results for input(s): "PROBNP" in the last 8760 hours. HbA1C: No results for input(s): "HGBA1C" in the last 72 hours. CBG: No results for input(s): "GLUCAP" in the last 168 hours. Lipid  Profile: No results for input(s): "CHOL", "HDL", "LDLCALC", "TRIG", "CHOLHDL", "LDLDIRECT" in the last 72 hours. Thyroid Function Tests: No results for input(s): "TSH", "T4TOTAL", "FREET4", "T3FREE", "THYROIDAB" in the last 72 hours. Anemia Panel: No results for input(s): "VITAMINB12", "FOLATE", "FERRITIN", "TIBC", "IRON", "RETICCTPCT" in the last 72 hours. Sepsis Labs: No results for input(s): "PROCALCITON", "LATICACIDVEN" in the last 168 hours.  Recent Results (from the past 240 hour(s))  Surgical PCR screen     Status: Abnormal   Collection Time: 12/01/21  8:15 PM   Specimen: Nasal Mucosa; Nasal Swab  Result Value Ref Range Status   MRSA, PCR NEGATIVE NEGATIVE Final   Staphylococcus aureus POSITIVE (A) NEGATIVE Final    Comment: (NOTE) The Xpert SA Assay (FDA approved for NASAL specimens in patients 47 years of age and older), is one component of a comprehensive surveillance program. It is not intended to diagnose infection nor to guide or monitor treatment. Performed at [facility], [place] [address], [place],  [address]          Radiology Studies: DG HIP UNILAT W OR W/O PELVIS 2-3 VIEWS RIGHT  Result Date: 12/02/2021 CLINICAL DATA:  Status post total right hip arthroplasty. PACU image. EXAM: DG HIP (WITH OR WITHOUT PELVIS) 2-3V RIGHT COMPARISON:  Right hip radiographs 11/30/2021 FINDINGS: Interval total right hip arthroplasty. No perihardware lucency is seen to indicate hardware failure or loosening. Expected postoperative changes including lateral right hip and thigh subcutaneous air and soft tissue swelling. Moderate left femoroacetabular joint space narrowing. Popcorn calcifications uterine fibroid overlies the midline pelvis. No acute fracture or dislocation. IMPRESSION: Interval total right hip arthroplasty without evidence of hardware failure. Electronically Signed   By: Yvonne Kendall M.D.   On: 12/02/2021 18:16        Scheduled Meds:   amLODipine  5 mg Oral Daily   aspirin EC  81 mg Oral BID   bacitracin   Topical BID   Chlorhexidine Gluconate Cloth  6 each Topical Daily   docusate sodium  100 mg Oral BID   feeding supplement  237 mL Oral TID BM   mirabegron ER  50 mg Oral QHS   multivitamin with minerals  1 tablet Oral Daily   mupirocin ointment  1 Application Nasal BID   pantoprazole  40 mg Oral QAC breakfast   Continuous Infusions:  methocarbamol (ROBAXIN) IV       LOS: 3 days   Time spent= 35 mins    Emoni Yang Arsenio Loader, MD [facility]  If 7PM-7AM, please contact night-coverage  12/03/2021, 8:28 AM

## 2021-12-03 NOTE — Evaluation (Signed)
Physical Therapy Evaluation Patient Details Name: Peggy [name] MRN: [id number] DOB: January 16, 1929 Today's Date: 12/03/2021  History of Present Illness  Pt s/p fall with R hip fx and now s/p R THR by posterior approach (no formal precautions).  Pt with hx of CKD and R ankle fx 8/22.  Clinical Impression  Pt admitted as above and presenting with functional mobility limitations 2* decreased R LE strength/ROM, post op pain, balance deficits and premorbid deconditioning.  Pt would benefit from follow up rehab at SNF level to maximize IND and safety.  Pt's dtr present and reports plan is for rehab setting at [facility].     Recommendations for follow up therapy are one component of a multi-disciplinary discharge planning process, led by the attending physician.  Recommendations may be updated based on patient status, additional functional criteria and insurance authorization.  Follow Up Recommendations Skilled nursing-short term rehab (<3 hours/day) Can patient physically be transported by private vehicle: No    Assistance Recommended at Discharge Frequent or constant Supervision/Assistance  Patient can return home with the following  A lot of help with walking and/or transfers;A little help with bathing/dressing/bathroom;Assistance with cooking/housework;Assist for transportation;Help with stairs or ramp for entrance    Equipment Recommendations None recommended by PT  Recommendations for Other Services       Functional Status Assessment Patient has had a recent decline in their functional status and demonstrates the ability to make significant improvements in function in a reasonable and predictable amount of time.     Precautions / Restrictions Precautions Precautions: Fall Restrictions Weight Bearing Restrictions: No RLE Weight Bearing: Weight bearing as tolerated      Mobility  Bed Mobility Overal bed mobility: Needs Assistance Bed Mobility: Supine to Sit     Supine to  sit: Mod assist, +2 for physical assistance, +2 for safety/equipment     General bed mobility comments: Increased time, cues for sequence and use of L LE to self assist.  Physical assist to manage R LE, control trunk and complete rotation to EOB sitting using bed pad    Transfers Overall transfer level: Needs assistance Equipment used: Rolling walker (2 wheels) Transfers: Sit to/from Stand Sit to Stand: Mod assist, +2 physical assistance, +2 safety/equipment, From elevated surface           General transfer comment: cues for LE management and use of UEs to self assist    Ambulation/Gait Ambulation/Gait assistance: Min assist, Mod assist, +2 physical assistance, +2 safety/equipment Gait Distance (Feet): 6 Feet Assistive device: Rolling walker (2 wheels) Gait Pattern/deviations: Step-to pattern, Decreased step length - right, Decreased step length - left, Decreased stance time - right, Trunk flexed, Shuffle       General Gait Details: Increased time with cues for sequence, posture and position from [employer]            Wheelchair Mobility    Modified Rankin (Stroke Patients Only)       Balance Overall balance assessment: Needs assistance Sitting-balance support: Feet supported, No upper extremity supported Sitting balance-Leahy Scale: Fair     Standing balance support: Bilateral upper extremity supported Standing balance-Leahy Scale: Poor                               Pertinent Vitals/Pain Pain Assessment Pain Assessment: Faces Faces Pain Scale: Hurts even more Pain Location: R hip with WB Pain Descriptors / Indicators: Aching, Sore, Grimacing Pain Intervention(s): Limited  activity within patient's tolerance, Monitored during session, Premedicated before session, Ice applied    Home Living Family/patient expects to be discharged to:: Unsure                   Additional Comments: Dtr reports likely rehab setting at [facility]     Prior Function Prior Level of Function : Independent/Modified Independent             Mobility Comments: using cane or RW as needed       Hand Dominance        Extremity/Trunk Assessment   Upper Extremity Assessment Upper Extremity Assessment: Generalized weakness    Lower Extremity Assessment Lower Extremity Assessment: Generalized weakness;RLE deficits/detail RLE: Unable to fully assess due to pain    Cervical / Trunk Assessment Cervical / Trunk Assessment: Kyphotic  Communication   Communication: No difficulties  Cognition Arousal/Alertness: Awake/alert Behavior During Therapy: WFL for tasks assessed/performed Overall Cognitive Status: Within Functional Limits for tasks assessed                                          General Comments      Exercises     Assessment/Plan    PT Assessment Patient needs continued PT services  PT Problem List Decreased strength;Decreased range of motion;Decreased activity tolerance;Decreased balance;Decreased mobility;Decreased knowledge of use of DME;Pain       PT Treatment Interventions DME instruction;Gait training;Stair training;Functional mobility training;Therapeutic activities;Therapeutic exercise;Patient/family education;Balance training    PT Goals (Current goals can be found in the Care Plan section)  Acute Rehab PT Goals Patient Stated Goal: Regain IND PT Goal Formulation: With patient Time For Goal Achievement: 12/10/21 Potential to Achieve Goals: Good    Frequency Min 3X/week     Co-evaluation               AM-PAC PT "6 Clicks" Mobility  Outcome Measure Help needed turning from your back to your side while in a flat bed without using bedrails?: A Lot Help needed moving from lying on your back to sitting on the side of a flat bed without using bedrails?: A Lot Help needed moving to and from a bed to a chair (including a wheelchair)?: A Lot Help needed standing up from a chair  using your arms (e.g., wheelchair or bedside chair)?: A Lot Help needed to walk in hospital room?: Total Help needed climbing 3-5 steps with a railing? : Total 6 Click Score: 10    End of Session Equipment Utilized During Treatment: Gait belt Activity Tolerance: Patient limited by fatigue;Patient limited by pain Patient left: in chair;with call bell/phone within reach;with chair alarm set;with family/visitor present Nurse Communication: Mobility status PT Visit Diagnosis: Difficulty in walking, not elsewhere classified (R26.2);Muscle weakness (generalized) (M62.81);History of falling (Z91.81);Pain Pain - Right/Left: Right Pain - part of body: Hip    Time: 0938-1829 PT Time Calculation (min) (ACUTE ONLY): 28 min   Charges:   PT Evaluation $PT Eval Low Complexity: 1 Low PT Treatments $Gait Training: 8-22 mins        Debe Coder PT Acute Rehabilitation Services Pager [phone] Office [phone]   Izabelle Daus 12/03/2021, 12:41 PM

## 2021-12-03 NOTE — Plan of Care (Signed)
Plan of care reviewed and discussed. °

## 2021-12-04 ENCOUNTER — Telehealth: Payer: Self-pay

## 2021-12-04 DIAGNOSIS — R2681 Unsteadiness on feet: Secondary | ICD-10-CM | POA: Diagnosis not present

## 2021-12-04 DIAGNOSIS — Z66 Do not resuscitate: Secondary | ICD-10-CM | POA: Diagnosis not present

## 2021-12-04 DIAGNOSIS — R531 Weakness: Secondary | ICD-10-CM | POA: Diagnosis not present

## 2021-12-04 DIAGNOSIS — D649 Anemia, unspecified: Secondary | ICD-10-CM | POA: Diagnosis not present

## 2021-12-04 DIAGNOSIS — I129 Hypertensive chronic kidney disease with stage 1 through stage 4 chronic kidney disease, or unspecified chronic kidney disease: Secondary | ICD-10-CM | POA: Diagnosis not present

## 2021-12-04 DIAGNOSIS — Z7982 Long term (current) use of aspirin: Secondary | ICD-10-CM | POA: Diagnosis not present

## 2021-12-04 DIAGNOSIS — S72001A Fracture of unspecified part of neck of right femur, initial encounter for closed fracture: Secondary | ICD-10-CM | POA: Diagnosis not present

## 2021-12-04 DIAGNOSIS — R0902 Hypoxemia: Secondary | ICD-10-CM | POA: Diagnosis not present

## 2021-12-04 DIAGNOSIS — K219 Gastro-esophageal reflux disease without esophagitis: Secondary | ICD-10-CM | POA: Diagnosis not present

## 2021-12-04 DIAGNOSIS — N184 Chronic kidney disease, stage 4 (severe): Secondary | ICD-10-CM | POA: Diagnosis not present

## 2021-12-04 DIAGNOSIS — M6281 Muscle weakness (generalized): Secondary | ICD-10-CM | POA: Diagnosis not present

## 2021-12-04 DIAGNOSIS — Z96641 Presence of right artificial hip joint: Secondary | ICD-10-CM | POA: Diagnosis not present

## 2021-12-04 DIAGNOSIS — E559 Vitamin D deficiency, unspecified: Secondary | ICD-10-CM | POA: Diagnosis not present

## 2021-12-04 DIAGNOSIS — M810 Age-related osteoporosis without current pathological fracture: Secondary | ICD-10-CM | POA: Diagnosis not present

## 2021-12-04 DIAGNOSIS — E43 Unspecified severe protein-calorie malnutrition: Secondary | ICD-10-CM | POA: Diagnosis not present

## 2021-12-04 DIAGNOSIS — I10 Essential (primary) hypertension: Secondary | ICD-10-CM | POA: Diagnosis not present

## 2021-12-04 DIAGNOSIS — R278 Other lack of coordination: Secondary | ICD-10-CM | POA: Diagnosis not present

## 2021-12-04 DIAGNOSIS — E785 Hyperlipidemia, unspecified: Secondary | ICD-10-CM | POA: Diagnosis not present

## 2021-12-04 DIAGNOSIS — Z471 Aftercare following joint replacement surgery: Secondary | ICD-10-CM | POA: Diagnosis not present

## 2021-12-04 DIAGNOSIS — N3281 Overactive bladder: Secondary | ICD-10-CM | POA: Diagnosis not present

## 2021-12-04 DIAGNOSIS — Z7401 Bed confinement status: Secondary | ICD-10-CM | POA: Diagnosis not present

## 2021-12-04 DIAGNOSIS — R41841 Cognitive communication deficit: Secondary | ICD-10-CM | POA: Diagnosis not present

## 2021-12-04 DIAGNOSIS — W19XXXD Unspecified fall, subsequent encounter: Secondary | ICD-10-CM | POA: Diagnosis not present

## 2021-12-04 DIAGNOSIS — R2689 Other abnormalities of gait and mobility: Secondary | ICD-10-CM | POA: Diagnosis not present

## 2021-12-04 DIAGNOSIS — R1312 Dysphagia, oropharyngeal phase: Secondary | ICD-10-CM | POA: Diagnosis not present

## 2021-12-04 DIAGNOSIS — S72001D Fracture of unspecified part of neck of right femur, subsequent encounter for closed fracture with routine healing: Secondary | ICD-10-CM | POA: Diagnosis not present

## 2021-12-04 LAB — CBC
HCT: 24.3 % — ABNORMAL LOW (ref 36.0–46.0)
Hemoglobin: 7.9 g/dL — ABNORMAL LOW (ref 12.0–15.0)
MCH: 32.1 pg (ref 26.0–34.0)
MCHC: 32.5 g/dL (ref 30.0–36.0)
MCV: 98.8 fL (ref 80.0–100.0)
Platelets: 167 10*3/uL (ref 150–400)
RBC: 2.46 MIL/uL — ABNORMAL LOW (ref 3.87–5.11)
RDW: 13.2 % (ref 11.5–15.5)
WBC: 11.1 10*3/uL — ABNORMAL HIGH (ref 4.0–10.5)
nRBC: 0 % (ref 0.0–0.2)

## 2021-12-04 MED ORDER — POLYETHYLENE GLYCOL 3350 17 G PO PACK: 17.0000 g | PACK | Freq: Every day | ORAL | 0 refills | Status: DC | PRN

## 2021-12-04 MED ORDER — OXYCODONE HCL 5 MG PO TABS: 5.0000 mg | ORAL_TABLET | ORAL | 0 refills | Status: DC | PRN

## 2021-12-04 MED ORDER — IPRATROPIUM-ALBUTEROL 0.5-2.5 (3) MG/3ML IN SOLN: 3.0000 mL | RESPIRATORY_TRACT | Status: DC | PRN

## 2021-12-04 MED ORDER — DOCUSATE SODIUM 100 MG PO CAPS: 100.0000 mg | ORAL_CAPSULE | Freq: Two times a day (BID) | ORAL | 0 refills | Status: DC

## 2021-12-04 MED ORDER — ASPIRIN 81 MG PO TBEC
81.0000 mg | DELAYED_RELEASE_TABLET | Freq: Two times a day (BID) | ORAL | 0 refills | Status: AC
Start: 2021-12-04 — End: 2022-01-03

## 2021-12-04 MED ORDER — ACETAMINOPHEN 325 MG PO TABS: 650.0000 mg | ORAL_TABLET | Freq: Four times a day (QID) | ORAL | Status: AC | PRN

## 2021-12-04 NOTE — Telephone Encounter (Signed)
Patients daughter called and her mother is being released from the hospital and sent into rehab at [facility] for a broken hip and on her discharge paper work it states that when she gets back to [employer] the will be changing her from IA to AL and daughter states that they do not want this and never agreed to it and what they can do to get this changed? Please advise on how to move forward with this situation.Left message with Daughters spouse to call us back as she was not home when I returned her call.

## 2021-12-07 DIAGNOSIS — S72001D Fracture of unspecified part of neck of right femur, subsequent encounter for closed fracture with routine healing: Secondary | ICD-10-CM | POA: Diagnosis not present

## 2021-12-07 DIAGNOSIS — I129 Hypertensive chronic kidney disease with stage 1 through stage 4 chronic kidney disease, or unspecified chronic kidney disease: Secondary | ICD-10-CM | POA: Diagnosis not present

## 2021-12-07 DIAGNOSIS — N3281 Overactive bladder: Secondary | ICD-10-CM | POA: Diagnosis not present

## 2021-12-07 DIAGNOSIS — R0902 Hypoxemia: Secondary | ICD-10-CM | POA: Diagnosis not present

## 2021-12-07 DIAGNOSIS — N184 Chronic kidney disease, stage 4 (severe): Secondary | ICD-10-CM | POA: Diagnosis not present

## 2021-12-07 DIAGNOSIS — R2689 Other abnormalities of gait and mobility: Secondary | ICD-10-CM | POA: Diagnosis not present

## 2021-12-07 DIAGNOSIS — Z471 Aftercare following joint replacement surgery: Secondary | ICD-10-CM | POA: Diagnosis not present

## 2021-12-07 DIAGNOSIS — Z96641 Presence of right artificial hip joint: Secondary | ICD-10-CM | POA: Diagnosis not present

## 2021-12-08 ENCOUNTER — Telehealth: Payer: Self-pay

## 2021-12-16 DIAGNOSIS — D649 Anemia, unspecified: Secondary | ICD-10-CM | POA: Diagnosis not present

## 2021-12-17 DIAGNOSIS — D649 Anemia, unspecified: Secondary | ICD-10-CM | POA: Diagnosis not present

## 2021-12-17 DIAGNOSIS — N1832 Chronic kidney disease, stage 3b: Secondary | ICD-10-CM | POA: Diagnosis not present

## 2021-12-17 DIAGNOSIS — N1831 Chronic kidney disease, stage 3a: Secondary | ICD-10-CM | POA: Diagnosis not present

## 2021-12-17 DIAGNOSIS — Z471 Aftercare following joint replacement surgery: Secondary | ICD-10-CM | POA: Diagnosis not present

## 2021-12-17 DIAGNOSIS — I872 Venous insufficiency (chronic) (peripheral): Secondary | ICD-10-CM | POA: Diagnosis not present

## 2021-12-17 DIAGNOSIS — R600 Localized edema: Secondary | ICD-10-CM | POA: Diagnosis not present

## 2021-12-17 DIAGNOSIS — R41841 Cognitive communication deficit: Secondary | ICD-10-CM | POA: Diagnosis not present

## 2021-12-17 DIAGNOSIS — N184 Chronic kidney disease, stage 4 (severe): Secondary | ICD-10-CM | POA: Diagnosis not present

## 2021-12-17 DIAGNOSIS — I10 Essential (primary) hypertension: Secondary | ICD-10-CM | POA: Diagnosis not present

## 2021-12-17 DIAGNOSIS — I129 Hypertensive chronic kidney disease with stage 1 through stage 4 chronic kidney disease, or unspecified chronic kidney disease: Secondary | ICD-10-CM | POA: Diagnosis not present

## 2021-12-17 DIAGNOSIS — R2681 Unsteadiness on feet: Secondary | ICD-10-CM | POA: Diagnosis not present

## 2021-12-17 DIAGNOSIS — M6281 Muscle weakness (generalized): Secondary | ICD-10-CM | POA: Diagnosis not present

## 2021-12-17 DIAGNOSIS — M8000XD Age-related osteoporosis with current pathological fracture, unspecified site, subsequent encounter for fracture with routine healing: Secondary | ICD-10-CM | POA: Diagnosis not present

## 2021-12-17 DIAGNOSIS — N3942 Incontinence without sensory awareness: Secondary | ICD-10-CM | POA: Diagnosis not present

## 2021-12-17 DIAGNOSIS — D631 Anemia in chronic kidney disease: Secondary | ICD-10-CM | POA: Diagnosis not present

## 2021-12-17 DIAGNOSIS — N189 Chronic kidney disease, unspecified: Secondary | ICD-10-CM | POA: Diagnosis not present

## 2021-12-17 DIAGNOSIS — R1312 Dysphagia, oropharyngeal phase: Secondary | ICD-10-CM | POA: Diagnosis not present

## 2021-12-17 DIAGNOSIS — Z96641 Presence of right artificial hip joint: Secondary | ICD-10-CM | POA: Diagnosis not present

## 2021-12-17 DIAGNOSIS — K5901 Slow transit constipation: Secondary | ICD-10-CM | POA: Diagnosis not present

## 2021-12-17 DIAGNOSIS — M159 Polyosteoarthritis, unspecified: Secondary | ICD-10-CM | POA: Diagnosis not present

## 2021-12-17 DIAGNOSIS — R809 Proteinuria, unspecified: Secondary | ICD-10-CM | POA: Diagnosis not present

## 2021-12-17 DIAGNOSIS — E860 Dehydration: Secondary | ICD-10-CM | POA: Diagnosis not present

## 2021-12-17 DIAGNOSIS — W19XXXD Unspecified fall, subsequent encounter: Secondary | ICD-10-CM | POA: Diagnosis not present

## 2021-12-17 DIAGNOSIS — R278 Other lack of coordination: Secondary | ICD-10-CM | POA: Diagnosis not present

## 2021-12-17 DIAGNOSIS — S72001S Fracture of unspecified part of neck of right femur, sequela: Secondary | ICD-10-CM | POA: Diagnosis not present

## 2021-12-17 DIAGNOSIS — S72001D Fracture of unspecified part of neck of right femur, subsequent encounter for closed fracture with routine healing: Secondary | ICD-10-CM | POA: Diagnosis not present

## 2021-12-18 ENCOUNTER — Encounter: Payer: Self-pay | Admitting: Adult Health

## 2021-12-18 ENCOUNTER — Non-Acute Institutional Stay (SKILLED_NURSING_FACILITY): Payer: Medicare Other | Admitting: Adult Health

## 2021-12-18 DIAGNOSIS — N1831 Chronic kidney disease, stage 3a: Secondary | ICD-10-CM

## 2021-12-18 DIAGNOSIS — I10 Essential (primary) hypertension: Secondary | ICD-10-CM

## 2021-12-18 DIAGNOSIS — S72001S Fracture of unspecified part of neck of right femur, sequela: Secondary | ICD-10-CM | POA: Diagnosis not present

## 2021-12-18 NOTE — Progress Notes (Addendum)
Location:  [facility] Room Number: NO/18/A Place of Service:  SNF (31) Provider:  Durenda Age, DNP, FNP-BC  Patient Care Team: Mast, Man X, NP as PCP - General (Internal Medicine)  Extended Emergency Contact Information Primary Emergency Contact: Afzal,[name] Address: [place], [place] [address] [name] of [place] Phone: [phone] Work Phone: [phone] Mobile Phone: [phone] Relation: Daughter Secondary Emergency Contact: Rowan Phone: [phone] Work Phone: [phone] Relation: Daughter  Code Status:  DNR  Goals of care: Advanced Directive information    12/18/2021   10:47 AM  Advanced Directives  Does Patient Have a Medical Advance Directive? Yes  Type of Advance Directive Out of facility DNR (pink MOST or yellow form);Healthcare Power of Attorney  Does patient want to make changes to medical advance directive? No - Patient declined  Copy of [place] in Chart? Yes - validated most recent copy scanned in chart (See row information)  Pre-existing out of facility DNR order (yellow form or pink MOST form) Pink MOST form placed in chart (order not valid for inpatient use);Yellow form placed in chart (order not valid for inpatient use)     Chief Complaint  Patient presents with   Follow-up    Follow up after admission     HPI:  Pt is a 88 y.o. female who was admitted to [facility] from [place] [facility].  She has a PMH of hypertension and hyperlipidemia.  She was hospitalized 11/30/2021 to 12/04/2021 following a fall at a water exercise program in the pool for which she sustained a right femoral neck fracture.  She had right total hip arthroplasty on 6/21.  She was seen in the room today with speech therapy at bedside.  She had a BIMS score of 11/15, ranging in moderate cognitive impairment. Right hip pain score 0/10.  Past  Medical History:  Diagnosis Date   Anemia    Chronic kidney disease    stage 3   GERD (gastroesophageal reflux disease)    Hypertension    Past Surgical History:  Procedure Laterality Date   ORIF ANKLE FRACTURE Right 01/13/2021   Procedure: OPEN TREATMENT OF RIGHT TRIMALLEOLAR ANKLE FRACTURE WITHOUT POSTERIOR FIXATION, POSSIBLE SYNDESMOSIS;  Surgeon: Erle Crocker, MD;  Location: [facility];  Service: Orthopedics;  Laterality: Right;   TOTAL HIP ARTHROPLASTY Right 12/02/2021   Procedure: TOTAL HIP ARTHROPLASTY;  Surgeon: Willaim Sheng, MD;  Location: [facility];  Service: Orthopedics;  Laterality: Right;    Allergies  Allergen Reactions   Ace Inhibitors Other (See Comments)    Reaction unknown   Flagyl [Metronidazole] Other (See Comments)    Reaction unknown   Penicillins Swelling and Other (See Comments)    Facial swelling; can tolerate cephalosporins     Outpatient Encounter Medications as of 12/18/2021  Medication Sig   acetaminophen (TYLENOL) 325 MG tablet Take 2 tablets (650 mg total) by mouth every 6 (six) hours as needed for mild pain, fever or headache.   amLODipine (NORVASC) 5 MG tablet Take 5 mg by mouth daily.   aspirin EC 81 MG tablet Take 1 tablet (81 mg total) by mouth 2 (two) times daily. Swallow whole.   cholecalciferol (VITAMIN D) 25 MCG (1000 UNIT) tablet Take 2 tablets (2,000 Units total) by mouth daily.   docusate sodium (COLACE) 100 MG capsule Take 1 capsule (100 mg total) by mouth 2 (two) times daily.  furosemide (LASIX) 20 MG tablet Take 1 tablet (20 mg total) by mouth daily.   ipratropium-albuterol (DUONEB) 0.5-2.5 (3) MG/3ML SOLN Take 3 mLs by nebulization every 4 (four) hours as needed.   mirabegron ER (MYRBETRIQ) 25 MG TB24 tablet Take Two tablets by mouth once daily.   Multiple Vitamins-Minerals (ICAPS AREDS 2 PO) Take 1 capsule by mouth daily with breakfast.   oxyCODONE (OXY IR/ROXICODONE) 5 MG immediate release tablet Take 1-2 tablets (5-10 mg  total) by mouth every 4 (four) hours as needed for breakthrough pain ((for MODERATE breakthrough pain)).   pantoprazole (PROTONIX) 40 MG tablet Take 1 tablet (40 mg total) by mouth daily.   polyethylene glycol (MIRALAX / GLYCOLAX) 17 g packet Take 17 g by mouth daily as needed for mild constipation.   vitamin C (ASCORBIC ACID) 250 MG tablet Take 1 tablet (250 mg total) by mouth daily.   No facility-administered encounter medications on file as of 12/18/2021.    Review of Systems  Constitutional:  Negative for appetite change, chills, fatigue and fever.  HENT:  Negative for congestion, hearing loss, rhinorrhea and sore throat.   Eyes: Negative.   Respiratory:  Negative for cough, shortness of breath and wheezing.   Cardiovascular:  Negative for chest pain, palpitations and leg swelling.  Gastrointestinal:  Negative for abdominal pain, constipation, diarrhea, nausea and vomiting.  Genitourinary:  Negative for dysuria.  Musculoskeletal:  Negative for arthralgias, back pain and myalgias.  Skin:  Negative for color change, rash and wound.  Neurological:  Negative for dizziness, weakness and headaches.  Psychiatric/Behavioral:  Negative for behavioral problems. The patient is not nervous/anxious.        Immunization History  Administered Date(s) Administered   Fluad Quad(high Dose 65+) 03/28/2019   H1N1 03/18/2009   Influenza, High Dose Seasonal PF 03/25/2017, 03/16/2018, 03/14/2021   Influenza-Unspecified 02/25/2011, 03/31/2012, 03/27/2013, 03/14/2014, 03/25/2016, 06/14/2017   Moderna SARS-COV2 Booster Vaccination 04/22/2020, 10/28/2020   Moderna Sars-Covid-2 Vaccination 06/18/2019, 07/16/2019   Pneumococcal Conjugate-13 09/10/2014   Pneumococcal Polysaccharide-23 11/03/2014   Pneumococcal-Unspecified 06/14/2017   Tdap 11/27/2019   Pertinent  Health Maintenance Due  Topic Date Due   INFLUENZA VACCINE  01/12/2022   DEXA SCAN  Completed      12/02/2021    7:01 AM 12/02/2021    9:20  PM 12/03/2021    2:00 PM 12/03/2021    8:19 PM 12/04/2021    9:42 AM  Fall Risk  Patient Fall Risk Level High fall risk High fall risk High fall risk High fall risk High fall risk     Vitals:   12/18/21 1044  BP: 140/85  Pulse: 80  Resp: 19  Temp: (!) 96.5 F (35.8 C)  SpO2: 92%  Weight: 128 lb 3.2 oz (58.2 kg)  Height: '5\' 4"'  (1.626 m)   Body mass index is 22.01 kg/m.  Physical Exam Constitutional:      General: She is not in acute distress.    Appearance: Normal appearance.  HENT:     Head: Normocephalic and atraumatic.     Nose: Nose normal.     Mouth/Throat:     Mouth: Mucous membranes are moist.  Eyes:     Conjunctiva/sclera: Conjunctivae normal.  Cardiovascular:     Rate and Rhythm: Normal rate and regular rhythm.  Pulmonary:     Effort: Pulmonary effort is normal.     Breath sounds: Normal breath sounds.  Abdominal:     General: Bowel sounds are normal.     Palpations: Abdomen  is soft.  Musculoskeletal:        General: Normal range of motion.     Cervical back: Normal range of motion.  Skin:    General: Skin is warm and dry.     Comments: Right hip surgical incision with Aquacel dressing, no redness, dry.  Neurological:     General: No focal deficit present.     Mental Status: She is alert and oriented to person, place, and time.  Psychiatric:        Mood and Affect: Mood normal.        Behavior: Behavior normal.        Thought Content: Thought content normal.        Judgment: Judgment normal.      Labs reviewed: Recent Labs    12/01/21 0449 12/02/21 0353 12/03/21 0352  NA 137 135 135  K 3.6 3.4* 3.9  CL 103 100 103  CO2 '24 24 25  ' GLUCOSE 103* 165* 126*  BUN 26* 33* 28*  CREATININE 1.48* 1.48* 1.38*  CALCIUM 9.5 9.7 9.0  MG  --  2.1 1.8   Recent Labs    12/20/20 0000 12/23/20 0000 01/13/21 0741 05/12/21 0715  AST '20 26 22 19  ' ALT '15 13 17 12  ' ALKPHOS 52 57 63  --   BILITOT  --   --  0.7 0.4  PROT  --   --  6.0* 6.5  ALBUMIN 4.2  4.2 3.3*  --    Recent Labs    05/12/21 0715 09/15/21 0725 11/30/21 1149 12/01/21 0449 12/02/21 0353 12/03/21 0719 12/04/21 0744  WBC 5.9 5.6 7.0   < > 14.3* 12.4* 11.1*  NEUTROABS 3,292 2,929 5.0  --   --   --   --   HGB 9.3* 9.4* 10.5*   < > 10.9* 8.7* 7.9*  HCT 28.2* 29.2* 32.7*   < > 33.5* 26.5* 24.3*  MCV 95.6 95.4 98.8   < > 98.0 97.8 98.8  PLT 246 230 231   < > 237 172 167   < > = values in this interval not displayed.   Lab Results  Component Value Date   TSH 2.48 11/20/2020   No results found for: "HGBA1C" Lab Results  Component Value Date   CHOL 159 08/16/2017   HDL 73 08/16/2017   LDLCALC 73 08/16/2017   TRIG 53 08/16/2017   CHOLHDL 2.2 08/16/2017    Significant Diagnostic Results in last 30 days:  DG HIP UNILAT W OR W/O PELVIS 2-3 VIEWS RIGHT  Result Date: 12/02/2021 CLINICAL DATA:  Status post total right hip arthroplasty. PACU image. EXAM: DG HIP (WITH OR WITHOUT PELVIS) 2-3V RIGHT COMPARISON:  Right hip radiographs 11/30/2021 FINDINGS: Interval total right hip arthroplasty. No perihardware lucency is seen to indicate hardware failure or loosening. Expected postoperative changes including lateral right hip and thigh subcutaneous air and soft tissue swelling. Moderate left femoroacetabular joint space narrowing. Popcorn calcifications uterine fibroid overlies the midline pelvis. No acute fracture or dislocation. IMPRESSION: Interval total right hip arthroplasty without evidence of hardware failure. Electronically Signed   By: Yvonne Kendall M.D.   On: 12/02/2021 18:16   DG Chest 1 View  Result Date: 11/30/2021 CLINICAL DATA:  Fall.  Concern for hip fracture. EXAM: CHEST  1 VIEW COMPARISON:  Radiographs 06/08/2016. FINDINGS: 1303 hours. Patient rotation to the right. The heart size and mediastinal contours are stable with cardiomegaly, aortic tortuosity and a small hiatal hernia. The lungs appear clear. No pleural  effusion or pneumothorax. No acute osseous findings  are seen in the chest. Telemetry leads overlie the chest. IMPRESSION: No evidence of acute chest injury or active cardiopulmonary process. Electronically Signed   By: Richardean Sale M.D.   On: 11/30/2021 13:22   DG Hip Unilat With Pelvis 2-3 Views Right  Result Date: 11/30/2021 CLINICAL DATA:  Fall onto right hip.  Concern for hip fracture. EXAM: DG HIP (WITH OR WITHOUT PELVIS) 2-3V RIGHT COMPARISON:  None Available. FINDINGS: The bones are demineralized. There is an acute, mildly displaced fracture of the right femoral neck. No evidence of acute pelvic fracture or dislocation. Underlying mild degenerative changes of both hips. Central pelvic calcification is likely a phlebolith. Lower lumbar spondylosis noted. IMPRESSION: Acute mildly displaced fracture of the right femoral neck. Electronically Signed   By: Richardean Sale M.D.   On: 11/30/2021 13:18    Assessment/Plan  1. Closed fracture of right hip, sequela -   S/p total hip arthroplasty on 6/21 -   WBAT -   Continue aspirin 81 mg twice a day for DVT prophylaxis -    Continue oxycodone 5 mg every 4 hours PRN for pain -   Follow-up with orthopedics  2. Primary hypertension -   Continue amlodipine 5 mg daily  3. Stage 3a chronic kidney disease (HCC) Lab Results  Component Value Date   NA 135 12/03/2021   K 3.9 12/03/2021   CO2 25 12/03/2021   GLUCOSE 126 (H) 12/03/2021   BUN 28 (H) 12/03/2021   CREATININE 1.38 (H) 12/03/2021   CALCIUM 9.0 12/03/2021   EGFR 28 (L) 05/12/2021   GFRNONAA 36 (L) 12/03/2021   -   Valsartan was discontinued in the hospital    Family/ staff Communication: Discussed plan of care with resident and charge nurse  Labs/tests ordered:   None    Durenda Age, DNP, MSN, FNP-BC [facility] [phone] (Monday-Friday 8:00 a.m. - 5:00 p.m.) [phone] (after hours)

## 2021-12-22 ENCOUNTER — Non-Acute Institutional Stay (SKILLED_NURSING_FACILITY): Payer: Medicare Other | Admitting: Internal Medicine

## 2021-12-22 ENCOUNTER — Encounter: Payer: Self-pay | Admitting: Internal Medicine

## 2021-12-22 DIAGNOSIS — I10 Essential (primary) hypertension: Secondary | ICD-10-CM

## 2021-12-22 DIAGNOSIS — N1832 Chronic kidney disease, stage 3b: Secondary | ICD-10-CM | POA: Diagnosis not present

## 2021-12-22 DIAGNOSIS — D649 Anemia, unspecified: Secondary | ICD-10-CM | POA: Diagnosis not present

## 2021-12-22 DIAGNOSIS — N3942 Incontinence without sensory awareness: Secondary | ICD-10-CM

## 2021-12-22 DIAGNOSIS — Z96641 Presence of right artificial hip joint: Secondary | ICD-10-CM

## 2021-12-22 DIAGNOSIS — R600 Localized edema: Secondary | ICD-10-CM | POA: Diagnosis not present

## 2021-12-22 DIAGNOSIS — S72001D Fracture of unspecified part of neck of right femur, subsequent encounter for closed fracture with routine healing: Secondary | ICD-10-CM | POA: Diagnosis not present

## 2021-12-22 NOTE — Progress Notes (Signed)
Provider:  Veleta Miners MD  Location:   [facility] Room Number: 18 Place of Service:  SNF (31)  PCP: Mast, Man X, NP Patient Care Team: Mast, Man X, NP as PCP - General (Internal Medicine)  Extended Emergency Contact Information Primary Emergency Contact: Manthei,[name] Address: [place], [place] [address] Peggy [name] of [place] Phone: [phone] Work Phone: [phone] Mobile Phone: [phone] Relation: Daughter Secondary Emergency Contact: [place] Phone: [phone] Work Phone: [phone] Relation: Daughter  Code Status: DNR Goals of Care: Advanced Directive information    12/22/2021   10:13 AM  Advanced Directives  Does Patient Have a Medical Advance Directive? Yes  Type of Advance Directive Out of facility DNR (pink MOST or yellow form);Healthcare Power of Attorney  Does patient want to make changes to medical advance directive? No - Patient declined  Copy of [place] in Chart? Yes - validated most recent copy scanned in chart (See row information)  Pre-existing out of facility DNR order (yellow form or pink MOST form) Pink MOST form placed in chart (order not valid for inpatient use);Yellow form placed in chart (order not valid for inpatient use)      Chief Complaint  Patient presents with   New Admit To SNF    Admission to SNF    HPI: Patient is a 88 y.o. female seen today for admission to SNF   Patient was admitted in the hospital from 6/19 to 6/23 for total hip arthroplasty after sustaining right femoral neck fracture  Patient has a history of urinary incontinence lower extremity edema ,hypertension, CKD stage IV, Anemia history of osteoporosis  Patient had a fall mechanical on the wet floor needed the pool and friends home.  Was found to have right femoral neck fracture.  Underwent total hip arthroplasty on 06/21.  Was discharged to [facility] with  friends home did not have a bed at that time. She was found to have anemia and was sent to ED. Her repeat hemoglobin was 8.2.  She was occult negative.  She was discharged to friend's home. Patient did not have any acute complaints today.  She is weightbearing as tolerated She is doing well with therapy denies any pain.  Past Medical History:  Diagnosis Date   Anemia    Chronic kidney disease    stage 3   GERD (gastroesophageal reflux disease)    Hypertension    Past Surgical History:  Procedure Laterality Date   ORIF ANKLE FRACTURE Right 01/13/2021   Procedure: OPEN TREATMENT OF RIGHT TRIMALLEOLAR ANKLE FRACTURE WITHOUT POSTERIOR FIXATION, POSSIBLE SYNDESMOSIS;  Surgeon: Erle Crocker, MD;  Location: [facility];  Service: Orthopedics;  Laterality: Right;   TOTAL HIP ARTHROPLASTY Right 12/02/2021   Procedure: TOTAL HIP ARTHROPLASTY;  Surgeon: Willaim Sheng, MD;  Location: [facility];  Service: Orthopedics;  Laterality: Right;    reports that she quit smoking about 64 years ago. Her smoking use included cigarettes. She has never used smokeless tobacco. She reports that she does not currently use alcohol. She reports that she does not use drugs. Social History   Socioeconomic History   Marital status: Widowed    Spouse name: Peggy [name]   Number of children: 2   Years of education: Not on file   Highest education level: Not on file  Occupational History   Occupation: teacher    Comment: retired  Tobacco Use   Smoking status: Former  Years: 12.00    Types: Cigarettes    Quit date: 06/14/1957    Years since quitting: 64.5   Smokeless tobacco: Never  Vaping Use   Vaping Use: Never used  Substance and Sexual Activity   Alcohol use: Not Currently   Drug use: No   Sexual activity: Not on file  Other Topics Concern   Not on file  Social History Narrative   Social History     Socioeconomic History       Marital status: Married   09/01/1954       Spouse name: Peggy [name]   Two people  stay in the home on the fourth floor       Number of children: 2       Years of education:       Highest education level: Not on file    Exercise: yoga, walking     Social Needs       Financial resource strain: Not on file       Food insecurity - worry: Not on file       Food insecurity - inability: Not on file       Transportation needs - medical: Not on file       Transportation needs - non-medical: Not on file     Occupational History       Not on file     Tobacco Use       Smoking status: Never Smoker       Smokeless tobacco: Never Used     Substance and Sexual Activity       Alcohol use: No       Drug use: No       Sexual activity: Not on file     Other Topics       Concerns:         Not on file     Social History Narrative       Not on file   Social Determinants of Health   Financial Resource Strain: Low Risk  (10/20/2017)   Overall Financial Resource Strain (CARDIA)    Difficulty of Paying Living Expenses: Not hard at all  Food Insecurity: No Food Insecurity (10/20/2017)   Hunger Vital Sign    Worried About Running Out of Food in the Last Year: Never true    [place] in the Last Year: Never true  Transportation Needs: No Transportation Needs (10/20/2017)   PRAPARE - Hydrologist (Medical): No    Lack of Transportation (Non-Medical): No  Physical Activity: Inactive (10/20/2017)   Exercise Vital Sign    Days of Exercise per Week: 0 days    Minutes of Exercise per Session: 0 min  Stress: No Stress Concern Present (10/20/2017)   [place]    Feeling of Stress : Only a little  Social Connections: Somewhat Isolated (10/20/2017)   Social Connection and Isolation Panel [NHANES]    Frequency of Communication with Friends and Family: More than three times a week    Frequency of Social Gatherings with Friends and Family: More than three times a week    Attends Religious Services:  Never    Marine scientist or Organizations: No    Attends Archivist Meetings: Never    Marital Status: Married  Human resources officer Violence: Not At Risk (10/20/2017)   Humiliation, Afraid, Rape, and Kick questionnaire    Fear of  Current or Ex-Partner: No    Emotionally Abused: No    Physically Abused: No    Sexually Abused: No    Functional Status Survey:    Family History  Problem Relation Age of Onset   Heart failure Mother    Heart disease Father    Arthritis Sister     Health Maintenance  Topic Date Due   COVID-19 Vaccine (3 - Moderna risk series) 11/25/2020   Zoster Vaccines- Shingrix (1 of 2) 06/14/2022 (Originally 05/04/1948)   INFLUENZA VACCINE  01/12/2022   TETANUS/TDAP  11/26/2029   Pneumonia Vaccine 55+ Years old  Completed   DEXA SCAN  Completed   HPV VACCINES  Aged Out    Allergies  Allergen Reactions   Ace Inhibitors Other (See Comments)    Reaction unknown   Flagyl [Metronidazole] Other (See Comments)    Reaction unknown   Penicillins Swelling and Other (See Comments)    Facial swelling; can tolerate cephalosporins     Allergies as of 12/22/2021       Reactions   Ace Inhibitors Other (See Comments)   Reaction unknown   Flagyl [metronidazole] Other (See Comments)   Reaction unknown   Penicillins Swelling, Other (See Comments)   Facial swelling; can tolerate cephalosporins        Medication List        Accurate as of December 22, 2021 10:14 AM. If you have any questions, ask your nurse or doctor.          acetaminophen 325 MG tablet Commonly known as: TYLENOL Take 2 tablets (650 mg total) by mouth every 6 (six) hours as needed for mild pain, fever or headache.   amLODipine 5 MG tablet Commonly known as: NORVASC Take 5 mg by mouth daily.   aspirin EC 81 MG tablet Take 1 tablet (81 mg total) by mouth 2 (two) times daily. Swallow whole.   cholecalciferol 25 MCG (1000 UNIT) tablet Commonly known as: VITAMIN D Take 2  tablets (2,000 Units total) by mouth daily.   docusate sodium 100 MG capsule Commonly known as: COLACE Take 1 capsule (100 mg total) by mouth 2 (two) times daily.   ferrous sulfate 325 (65 FE) MG tablet Take 325 mg by mouth daily with breakfast.   furosemide 20 MG tablet Commonly known as: LASIX Take 1 tablet (20 mg total) by mouth daily.   ICAPS AREDS 2 PO Take 1 capsule by mouth daily with breakfast.   ipratropium-albuterol 0.5-2.5 (3) MG/3ML Soln Commonly known as: DUONEB Take 3 mLs by nebulization every 4 (four) hours as needed.   Myrbetriq 25 MG Tb24 tablet Generic drug: mirabegron ER Take 25 mg by mouth daily. What changed: Another medication with the same name was removed. Continue taking this medication, and follow the directions you see here. Changed by: Virgie Dad, MD   oxyCODONE 5 MG immediate release tablet Commonly known as: Oxy IR/ROXICODONE Take 1-2 tablets (5-10 mg total) by mouth every 4 (four) hours as needed for breakthrough pain ((for MODERATE breakthrough pain)).   pantoprazole 40 MG tablet Commonly known as: PROTONIX Take 1 tablet (40 mg total) by mouth daily.   polyethylene glycol 17 g packet Commonly known as: MIRALAX / GLYCOLAX Take 17 g by mouth daily as needed for mild constipation.   vitamin C 250 MG tablet Commonly known as: ASCORBIC ACID Take 1 tablet (250 mg total) by mouth daily.        Review of Systems  Constitutional:  Negative for  activity change and appetite change.  HENT: Negative.    Respiratory:  Negative for cough and shortness of breath.   Cardiovascular:  Negative for leg swelling.  Gastrointestinal:  Negative for constipation.  Genitourinary: Negative.   Musculoskeletal:  Positive for gait problem. Negative for arthralgias and myalgias.  Skin: Negative.   Neurological:  Negative for dizziness and weakness.  Psychiatric/Behavioral:  Negative for confusion, dysphoric mood and sleep disturbance.     Vitals:    12/22/21 1003  BP: (!) 150/90  Pulse: 90  Resp: 18  Temp: 98.4 F (36.9 C)  SpO2: 94%  Weight: 128 lb 3.2 oz (58.2 kg)  Height: 5\' 5"  (1.651 m)   Body mass index is 21.33 kg/m. Physical Exam Vitals reviewed.  Constitutional:      Appearance: Normal appearance.  HENT:     Head: Normocephalic.     Nose: Nose normal.     Mouth/Throat:     Mouth: Mucous membranes are moist.     Pharynx: Oropharynx is clear.  Eyes:     Pupils: Pupils are equal, round, and reactive to light.  Cardiovascular:     Rate and Rhythm: Normal rate and regular rhythm.     Pulses: Normal pulses.     Heart sounds: Normal heart sounds. No murmur heard. Pulmonary:     Effort: Pulmonary effort is normal.     Breath sounds: Normal breath sounds.  Abdominal:     General: Abdomen is flat. Bowel sounds are normal.     Palpations: Abdomen is soft.  Musculoskeletal:        General: Swelling present.     Cervical back: Neck supple.     Comments: Has mod swelling in her Legs Bilateral  Skin:    General: Skin is warm.  Neurological:     General: No focal deficit present.     Mental Status: She is alert and oriented to person, place, and time.  Psychiatric:        Mood and Affect: Mood normal.        Thought Content: Thought content normal.     Labs reviewed: Basic Metabolic Panel: Recent Labs    12/01/21 0449 12/02/21 0353 12/03/21 0352  NA 137 135 135  K 3.6 3.4* 3.9  CL 103 100 103  CO2 24 24 25   GLUCOSE 103* 165* 126*  BUN 26* 33* 28*  CREATININE 1.48* 1.48* 1.38*  CALCIUM 9.5 9.7 9.0  MG  --  2.1 1.8   Liver Function Tests: Recent Labs    12/23/20 0000 01/13/21 0741 05/12/21 0715  AST 26 22 19   ALT 13 17 12   ALKPHOS 57 63  --   BILITOT  --  0.7 0.4  PROT  --  6.0* 6.5  ALBUMIN 4.2 3.3*  --    No results for input(s): "LIPASE", "AMYLASE" in the last 8760 hours. No results for input(s): "AMMONIA" in the last 8760 hours. CBC: Recent Labs    05/12/21 0715 09/15/21 0725  11/30/21 1149 12/01/21 0449 12/02/21 0353 12/03/21 0719 12/04/21 0744  WBC 5.9 5.6 7.0   < > 14.3* 12.4* 11.1*  NEUTROABS 3,292 2,929 5.0  --   --   --   --   HGB 9.3* 9.4* 10.5*   < > 10.9* 8.7* 7.9*  HCT 28.2* 29.2* 32.7*   < > 33.5* 26.5* 24.3*  MCV 95.6 95.4 98.8   < > 98.0 97.8 98.8  PLT 246 230 231   < > 237 172 167   < > =  values in this interval not displayed.   Cardiac Enzymes: No results for input(s): "CKTOTAL", "CKMB", "CKMBINDEX", "TROPONINI" in the last 8760 hours. BNP: Invalid input(s): "POCBNP" No results found for: "HGBA1C" Lab Results  Component Value Date   TSH 2.48 11/20/2020   Lab Results  Component Value Date   VITAMINB12 480 09/15/2021   No results found for: "FOLATE" Lab Results  Component Value Date   IRON 112 08/26/2021   TIBC 273 08/26/2021   FERRITIN 100 12/08/2020    Imaging and Procedures obtained prior to SNF admission: DG Chest 1 View  Result Date: 11/30/2021 CLINICAL DATA:  Fall.  Concern for hip fracture. EXAM: CHEST  1 VIEW COMPARISON:  Radiographs 06/08/2016. FINDINGS: 1303 hours. Patient rotation to the right. The heart size and mediastinal contours are stable with cardiomegaly, aortic tortuosity and a small hiatal hernia. The lungs appear clear. No pleural effusion or pneumothorax. No acute osseous findings are seen in the chest. Telemetry leads overlie the chest. IMPRESSION: No evidence of acute chest injury or active cardiopulmonary process. Electronically Signed   By: Richardean Sale M.D.   On: 11/30/2021 13:22   DG Hip Unilat With Pelvis 2-3 Views Right  Result Date: 11/30/2021 CLINICAL DATA:  Fall onto right hip.  Concern for hip fracture. EXAM: DG HIP (WITH OR WITHOUT PELVIS) 2-3V RIGHT COMPARISON:  None Available. FINDINGS: The bones are demineralized. There is an acute, mildly displaced fracture of the right femoral neck. No evidence of acute pelvic fracture or dislocation. Underlying mild degenerative changes of both hips.  Central pelvic calcification is likely a phlebolith. Lower lumbar spondylosis noted. IMPRESSION: Acute mildly displaced fracture of the right femoral neck. Electronically Signed   By: Richardean Sale M.D.   On: 11/30/2021 13:18    Assessment/Plan 1. Status post total replacement of right hip WBAT On Aspirin Pain control Doing well with therapy  2. Anemia, unspecified type On Iron She does have good Iron Stores Was Occult negative in ED Most likely her anemia is due to CKD Will Repeat CBC  3. Bilateral leg edema On Lasix which is everyday now more then her usual dose She wants to control the edema by elevating her legs Will not change her dose right now  4. Stage 3b chronic kidney disease (HCC) Creat stable Does follow with Renal  5. Urinary incontinence without sensory awareness On Myrbetriq  6. Primary hypertension In Norvasc    Family/ staff Communication:   Labs/tests ordered: CBC,CMP in 1 week

## 2021-12-24 ENCOUNTER — Encounter: Payer: Medicare Other | Admitting: Nurse Practitioner

## 2021-12-28 DIAGNOSIS — R809 Proteinuria, unspecified: Secondary | ICD-10-CM | POA: Diagnosis not present

## 2021-12-28 DIAGNOSIS — N1832 Chronic kidney disease, stage 3b: Secondary | ICD-10-CM | POA: Diagnosis not present

## 2021-12-28 DIAGNOSIS — D631 Anemia in chronic kidney disease: Secondary | ICD-10-CM | POA: Diagnosis not present

## 2021-12-28 DIAGNOSIS — I129 Hypertensive chronic kidney disease with stage 1 through stage 4 chronic kidney disease, or unspecified chronic kidney disease: Secondary | ICD-10-CM | POA: Diagnosis not present

## 2021-12-29 ENCOUNTER — Telehealth: Payer: Self-pay | Admitting: Internal Medicine

## 2021-12-29 NOTE — Telephone Encounter (Signed)
HGB 8 on today's CBC Already on Iron and Vit c Repeat CBC in 2 weeks

## 2022-01-08 ENCOUNTER — Encounter: Payer: Self-pay | Admitting: Nurse Practitioner

## 2022-01-08 ENCOUNTER — Non-Acute Institutional Stay (SKILLED_NURSING_FACILITY): Payer: Medicare Other | Admitting: Nurse Practitioner

## 2022-01-08 DIAGNOSIS — K5901 Slow transit constipation: Secondary | ICD-10-CM

## 2022-01-08 DIAGNOSIS — N184 Chronic kidney disease, stage 4 (severe): Secondary | ICD-10-CM

## 2022-01-08 DIAGNOSIS — I872 Venous insufficiency (chronic) (peripheral): Secondary | ICD-10-CM

## 2022-01-08 DIAGNOSIS — M159 Polyosteoarthritis, unspecified: Secondary | ICD-10-CM

## 2022-01-08 DIAGNOSIS — M8000XD Age-related osteoporosis with current pathological fracture, unspecified site, subsequent encounter for fracture with routine healing: Secondary | ICD-10-CM

## 2022-01-08 DIAGNOSIS — I10 Essential (primary) hypertension: Secondary | ICD-10-CM

## 2022-01-08 DIAGNOSIS — D649 Anemia, unspecified: Secondary | ICD-10-CM | POA: Diagnosis not present

## 2022-01-08 DIAGNOSIS — N3942 Incontinence without sensory awareness: Secondary | ICD-10-CM | POA: Diagnosis not present

## 2022-01-08 NOTE — Progress Notes (Unsigned)
Location:  [place] Room Number: NO/18/A Place of Service:  SNF (31) Provider:  Glenola Wheat X, NP Virgie Dad, MD  Patient Care Team: Virgie Dad, MD as PCP - General (Internal Medicine)  Extended Emergency Contact Information Primary Emergency Contact: Rainey,[name] Address: [place], [place] [address] [name] Phone: [phone] Work Phone: [phone] Mobile Phone: [phone] Relation: Daughter Secondary Emergency Contact: Wolfe Phone: [phone] Work Phone: [phone] Relation: Daughter  Code Status:  DNR Goals of care: Advanced Directive information    01/08/2022   11:57 AM  Advanced Directives  Does Patient Have a Medical Advance Directive? Yes  Type of Advance Directive Out of facility DNR (pink MOST or yellow form);Healthcare Power of Attorney  Does patient want to make changes to medical advance directive? No - Patient declined  Copy of [place] in Chart? Yes - validated most recent copy scanned in chart (See row information)  Pre-existing out of facility DNR order (yellow form or pink MOST form) Pink MOST form placed in chart (order not valid for inpatient use);Yellow form placed in chart (order not valid for inpatient use)     Chief Complaint  Patient presents with   Discharge Note    Patient is being discharged from SNF to [place]    HPI:  Pt is a 88 y.o. female seen today with medical history significant for anemia, BLE edema, OA, urinary frequency, CKD, OA, HTN, constipation, and osteoporosis was admitted to [facility] for therapy following hospital stay 6/19-6/23 for the R total hip arthroplasty after R femoral neck fracture.   The patient has regained physical strength, ADL function, is ready to return to her [place] [facility], continue with therapy.  Anemia, chronic, acute blood loss after surgery, baseline Hgb 9s,  on Pantoprazole for GI protection,  FOBT negative, Iron 112 08/26/21, Vit B12 480, EPO 9.9 09/15/21, On Fe, Hgb 7.9 12/04/21 Edema BLE, chronic, on Furosemide, 07/2021 venous US LLE negative DVT CKD Bun/creat  26/1.38 12/03/21, saw nephrology OA, Hx of R ankle fx             Her urinary frequency/leakage, uses pads, it seems better since taking Myrbetriq in pm per Urology             HTN, blood pressure is controlled, on Amlodipine 82m qd started 07/07/21 by Nephrology.              No constipation, taking Colace bid, prn MiraLax              Osteoporosis, off Alendronate due to CKD, 11/05/20 DEXA t score -2.8             Fecal incontinence prn Imodium                Past Medical History:  Diagnosis Date   Anemia    Chronic kidney disease    stage 3   GERD (gastroesophageal reflux disease)    Hypertension    Past Surgical History:  Procedure Laterality Date   ORIF ANKLE FRACTURE Right 01/13/2021   Procedure: OPEN TREATMENT OF RIGHT TRIMALLEOLAR ANKLE FRACTURE WITHOUT POSTERIOR FIXATION, POSSIBLE SYNDESMOSIS;  Surgeon: AErle Crocker MD;  [place]  Service: Orthopedics;  Laterality: Right;   TOTAL HIP ARTHROPLASTY Right 12/02/2021   Procedure: TOTAL HIP ARTHROPLASTY;  Surgeon: MWillaim Sheng MD;  Location: [facility];  Service: Orthopedics;  Laterality:  Right;    Allergies  Allergen Reactions   Ace Inhibitors Other (See Comments)    Reaction unknown   Flagyl [Metronidazole] Other (See Comments)    Reaction unknown   Penicillins Swelling and Other (See Comments)    Facial swelling; can tolerate cephalosporins     Outpatient Encounter Medications as of 01/08/2022  Medication Sig   acetaminophen (TYLENOL) 325 MG tablet Take 2 tablets (650 mg total) by mouth every 6 (six) hours as needed for mild pain, fever or headache.   amLODipine (NORVASC) 5 MG tablet Take 5 mg by mouth daily.   cholecalciferol (VITAMIN D) 25 MCG (1000 UNIT) tablet Take 2 tablets (2,000 Units total) by mouth daily.   docusate sodium  (COLACE) 100 MG capsule Take 1 capsule (100 mg total) by mouth 2 (two) times daily.   ferrous sulfate 325 (65 FE) MG tablet Take 325 mg by mouth daily with breakfast.   furosemide (LASIX) 20 MG tablet Take 1 tablet (20 mg total) by mouth daily.   ipratropium-albuterol (DUONEB) 0.5-2.5 (3) MG/3ML SOLN Take 3 mLs by nebulization every 4 (four) hours as needed.   mirabegron ER (MYRBETRIQ) 25 MG TB24 tablet Take 25 mg by mouth daily.   Multiple Vitamins-Minerals (ICAPS AREDS 2 PO) Take 1 capsule by mouth daily with breakfast.   oxyCODONE (OXY IR/ROXICODONE) 5 MG immediate release tablet Take 1-2 tablets (5-10 mg total) by mouth every 4 (four) hours as needed for breakthrough pain ((for MODERATE breakthrough pain)).   pantoprazole (PROTONIX) 40 MG tablet Take 1 tablet (40 mg total) by mouth daily.   polyethylene glycol (MIRALAX / GLYCOLAX) 17 g packet Take 17 g by mouth daily as needed for mild constipation.   vitamin C (ASCORBIC ACID) 250 MG tablet Take 1 tablet (250 mg total) by mouth daily.   No facility-administered encounter medications on file as of 01/08/2022.    Review of Systems  Constitutional:  Negative for fatigue, fever and unexpected weight change.  HENT:  Positive for hearing loss. Negative for congestion and voice change.   Eyes:  Negative for visual disturbance.  Respiratory:  Negative for shortness of breath.   Cardiovascular:  Positive for leg swelling.  Gastrointestinal:  Negative for abdominal pain and constipation.       Fecal incontinent.   Genitourinary:  Positive for frequency. Negative for dysuria and urgency.       Occasionally incontinent of urine. Urination average every 4 hrs during day 1x/night.   Musculoskeletal:  Positive for arthralgias and gait problem.       Right lower leg/ankle brace  Skin:  Negative for color change.       Left calf skin tear is healed.   Neurological:  Negative for speech difficulty, weakness and light-headedness.       Memory lapses.    Psychiatric/Behavioral:  Negative for behavioral problems and sleep disturbance. The patient is not nervous/anxious.     Immunization History  Administered Date(s) Administered   Fluad Quad(high Dose 65+) 03/28/2019   H1N1 03/18/2009   Influenza, High Dose Seasonal PF 03/25/2017, 03/16/2018, 03/14/2021   Influenza-Unspecified 02/25/2011, 03/31/2012, 03/27/2013, 03/14/2014, 03/25/2016, 06/14/2017   Moderna SARS-COV2 Booster Vaccination 04/22/2020, 10/28/2020   Moderna Sars-Covid-2 Vaccination 06/18/2019, 07/16/2019   Pneumococcal Conjugate-13 09/10/2014   Pneumococcal Polysaccharide-23 11/03/2014   Pneumococcal-Unspecified 06/14/2017   Tdap 11/27/2019   Pertinent  Health Maintenance Due  Topic Date Due   INFLUENZA VACCINE  01/12/2022   DEXA SCAN  Completed      12/02/2021  7:01 AM 12/02/2021    9:20 PM 12/03/2021    2:00 PM 12/03/2021    8:19 PM 12/04/2021    9:42 AM  Fall Risk  Patient Fall Risk Level High fall risk High fall risk High fall risk High fall risk High fall risk   Functional Status Survey:    Vitals:   01/08/22 1156  BP: 130/76  Pulse: 88  Resp: 20  Temp: 97.6 F (36.4 C)  SpO2: 95%  Weight: 123 lb (55.8 kg)  Height: '5\' 5"'  (1.651 m)   Body mass index is 20.47 kg/m. Physical Exam Vitals reviewed.  Constitutional:      Appearance: Normal appearance.  HENT:     Head: Normocephalic and atraumatic.     Nose: Nose normal.     Mouth/Throat:     Mouth: Mucous membranes are moist.  Eyes:     Extraocular Movements: Extraocular movements intact.     Conjunctiva/sclera: Conjunctivae normal.     Pupils: Pupils are equal, round, and reactive to light.  Cardiovascular:     Rate and Rhythm: Normal rate and regular rhythm.     Heart sounds: No murmur heard. Pulmonary:     Breath sounds: No rales.  Abdominal:     General: Bowel sounds are normal.     Palpations: Abdomen is soft.     Tenderness: There is no abdominal tenderness.  Musculoskeletal:         General: No tenderness.     Cervical back: Normal range of motion and neck supple.     Right lower leg: Edema present.     Left lower leg: Edema present.     Comments: R ankle s/p ORIF 01/13/21. Edema BLE trace. THR 11/2021  Skin:    General: Skin is warm and dry.     Comments: Chronic pigmented venous insufficiency skin changes BLE.   Neurological:     Mental Status: She is alert. Mental status is at baseline.     Gait: Gait abnormal.     Comments: Oriented to person, place.   Psychiatric:        Mood and Affect: Mood normal.        Behavior: Behavior normal.        Thought Content: Thought content normal.     Labs reviewed: Recent Labs    12/01/21 0449 12/02/21 0353 12/03/21 0352  NA 137 135 135  K 3.6 3.4* 3.9  CL 103 100 103  CO2 '24 24 25  ' GLUCOSE 103* 165* 126*  BUN 26* 33* 28*  CREATININE 1.48* 1.48* 1.38*  CALCIUM 9.5 9.7 9.0  MG  --  2.1 1.8   Recent Labs    01/13/21 0741 05/12/21 0715  AST 22 19  ALT 17 12  ALKPHOS 63  --   BILITOT 0.7 0.4  PROT 6.0* 6.5  ALBUMIN 3.3*  --    Recent Labs    05/12/21 0715 09/15/21 0725 11/30/21 1149 12/01/21 0449 12/02/21 0353 12/03/21 0719 12/04/21 0744  WBC 5.9 5.6 7.0   < > 14.3* 12.4* 11.1*  NEUTROABS 3,292 2,929 5.0  --   --   --   --   HGB 9.3* 9.4* 10.5*   < > 10.9* 8.7* 7.9*  HCT 28.2* 29.2* 32.7*   < > 33.5* 26.5* 24.3*  MCV 95.6 95.4 98.8   < > 98.0 97.8 98.8  PLT 246 230 231   < > 237 172 167   < > = values in this interval not  displayed.   Lab Results  Component Value Date   TSH 2.48 11/20/2020   No results found for: "HGBA1C" Lab Results  Component Value Date   CHOL 159 08/16/2017   HDL 73 08/16/2017   LDLCALC 73 08/16/2017   TRIG 53 08/16/2017   CHOLHDL 2.2 08/16/2017    Significant Diagnostic Results in last 30 days:  No results found.  Assessment/Plan Anemia chronic, acute blood loss after surgery, baseline Hgb 9s,  on Pantoprazole for GI protection, FOBT negative, Iron 112 08/26/21,  Vit B12 480, EPO 9.9 09/15/21, On Fe, Hgb 7.9 12/04/21, update CBC/diff.   Osteoarthritis, multiple sites the R total hip arthroplasty after R femoral neck fracture 11/2021, Hx of R ankle fx. Pain is managed with prn Tylenol, Oxycodone.   Venous insufficiency (chronic) (peripheral) Chronic edema BLE,  chronic, on Furosemide, 07/2021 venous US LLE negative DVT  CKD (chronic kidney disease) stage 4, GFR 15-29 ml/min (HCC) Bun/creat  26/1.38 12/03/21, saw nephrology, update CMP/eGFR  Incontinent of urine Her urinary frequency/leakage, uses pads, it seems better since taking Myrbetriq in pm per Urology  Hypertension blood pressure is controlled, on Amlodipine 28m qd started 07/07/21 by Nephrology.   Slow transit constipation Stable, taking Colace bid, prn MiraLax  Osteoporosis off Alendronate due to CKD, 11/05/20 DEXA t score -2.8     Family/ staff Communication: plan of care reviewed with the patient and charge nurse.   Labs/tests ordered: CBC/diff, CMP/eGFR

## 2022-01-10 NOTE — Progress Notes (Signed)
This encounter was created in error - please disregard.

## 2022-01-11 ENCOUNTER — Encounter: Payer: Self-pay | Admitting: Nurse Practitioner

## 2022-01-11 NOTE — Assessment & Plan Note (Signed)
off Alendronate due to CKD, 11/05/20 DEXA t score -2.8

## 2022-01-11 NOTE — Assessment & Plan Note (Signed)
the R total hip arthroplasty after R femoral neck fracture 11/2021, Hx of R ankle fx. Pain is managed with prn Tylenol, Oxycodone.

## 2022-01-11 NOTE — Assessment & Plan Note (Signed)
Stable, taking Colace bid, prn MiraLax

## 2022-01-11 NOTE — Assessment & Plan Note (Signed)
Her urinary frequency/leakage, uses pads, it seems better since taking Myrbetriq in pm per Urology

## 2022-01-11 NOTE — Assessment & Plan Note (Signed)
chronic, acute blood loss after surgery, baseline Hgb 9s,  on Pantoprazole for GI protection, FOBT negative, Iron 112 08/26/21, Vit B12 480, EPO 9.9 09/15/21, On Fe, Hgb 7.9 12/04/21, update CBC/diff.

## 2022-01-11 NOTE — Assessment & Plan Note (Signed)
Bun/creat  26/1.38 12/03/21, saw nephrology, update CMP/eGFR

## 2022-01-11 NOTE — Assessment & Plan Note (Signed)
blood pressure is controlled, on Amlodipine 5mg  qd started 07/07/21 by Nephrology.

## 2022-01-11 NOTE — Assessment & Plan Note (Signed)
Chronic edema BLE,  chronic, on Furosemide, 07/2021 venous US LLE negative DVT

## 2022-01-14 DIAGNOSIS — W19XXXD Unspecified fall, subsequent encounter: Secondary | ICD-10-CM | POA: Diagnosis not present

## 2022-01-14 DIAGNOSIS — R2681 Unsteadiness on feet: Secondary | ICD-10-CM | POA: Diagnosis not present

## 2022-01-14 DIAGNOSIS — M6281 Muscle weakness (generalized): Secondary | ICD-10-CM | POA: Diagnosis not present

## 2022-01-14 DIAGNOSIS — R29898 Other symptoms and signs involving the musculoskeletal system: Secondary | ICD-10-CM | POA: Diagnosis not present

## 2022-01-14 DIAGNOSIS — R41841 Cognitive communication deficit: Secondary | ICD-10-CM | POA: Diagnosis not present

## 2022-01-14 DIAGNOSIS — S72001D Fracture of unspecified part of neck of right femur, subsequent encounter for closed fracture with routine healing: Secondary | ICD-10-CM | POA: Diagnosis not present

## 2022-01-14 DIAGNOSIS — N3946 Mixed incontinence: Secondary | ICD-10-CM | POA: Diagnosis not present

## 2022-01-14 DIAGNOSIS — Z9181 History of falling: Secondary | ICD-10-CM | POA: Diagnosis not present

## 2022-01-14 DIAGNOSIS — Z96641 Presence of right artificial hip joint: Secondary | ICD-10-CM | POA: Diagnosis not present

## 2022-01-15 DIAGNOSIS — W19XXXD Unspecified fall, subsequent encounter: Secondary | ICD-10-CM | POA: Diagnosis not present

## 2022-01-15 DIAGNOSIS — R41841 Cognitive communication deficit: Secondary | ICD-10-CM | POA: Diagnosis not present

## 2022-01-15 DIAGNOSIS — R2681 Unsteadiness on feet: Secondary | ICD-10-CM | POA: Diagnosis not present

## 2022-01-15 DIAGNOSIS — S72001D Fracture of unspecified part of neck of right femur, subsequent encounter for closed fracture with routine healing: Secondary | ICD-10-CM | POA: Diagnosis not present

## 2022-01-15 DIAGNOSIS — M6281 Muscle weakness (generalized): Secondary | ICD-10-CM | POA: Diagnosis not present

## 2022-01-15 DIAGNOSIS — Z9181 History of falling: Secondary | ICD-10-CM | POA: Diagnosis not present

## 2022-01-18 DIAGNOSIS — R2681 Unsteadiness on feet: Secondary | ICD-10-CM | POA: Diagnosis not present

## 2022-01-18 DIAGNOSIS — S72001D Fracture of unspecified part of neck of right femur, subsequent encounter for closed fracture with routine healing: Secondary | ICD-10-CM | POA: Diagnosis not present

## 2022-01-18 DIAGNOSIS — M6281 Muscle weakness (generalized): Secondary | ICD-10-CM | POA: Diagnosis not present

## 2022-01-18 DIAGNOSIS — R41841 Cognitive communication deficit: Secondary | ICD-10-CM | POA: Diagnosis not present

## 2022-01-18 DIAGNOSIS — W19XXXD Unspecified fall, subsequent encounter: Secondary | ICD-10-CM | POA: Diagnosis not present

## 2022-01-18 DIAGNOSIS — Z9181 History of falling: Secondary | ICD-10-CM | POA: Diagnosis not present

## 2022-01-19 DIAGNOSIS — W19XXXD Unspecified fall, subsequent encounter: Secondary | ICD-10-CM | POA: Diagnosis not present

## 2022-01-19 DIAGNOSIS — M6281 Muscle weakness (generalized): Secondary | ICD-10-CM | POA: Diagnosis not present

## 2022-01-19 DIAGNOSIS — S72001D Fracture of unspecified part of neck of right femur, subsequent encounter for closed fracture with routine healing: Secondary | ICD-10-CM | POA: Diagnosis not present

## 2022-01-19 DIAGNOSIS — Z9181 History of falling: Secondary | ICD-10-CM | POA: Diagnosis not present

## 2022-01-19 DIAGNOSIS — R41841 Cognitive communication deficit: Secondary | ICD-10-CM | POA: Diagnosis not present

## 2022-01-19 DIAGNOSIS — R2681 Unsteadiness on feet: Secondary | ICD-10-CM | POA: Diagnosis not present

## 2022-01-20 ENCOUNTER — Encounter: Payer: Self-pay | Admitting: Nurse Practitioner

## 2022-01-21 ENCOUNTER — Non-Acute Institutional Stay: Payer: Medicare Other | Admitting: Nurse Practitioner

## 2022-01-21 ENCOUNTER — Encounter: Payer: Self-pay | Admitting: Nurse Practitioner

## 2022-01-21 DIAGNOSIS — I10 Essential (primary) hypertension: Secondary | ICD-10-CM

## 2022-01-21 DIAGNOSIS — N3942 Incontinence without sensory awareness: Secondary | ICD-10-CM | POA: Diagnosis not present

## 2022-01-21 DIAGNOSIS — I872 Venous insufficiency (chronic) (peripheral): Secondary | ICD-10-CM | POA: Diagnosis not present

## 2022-01-21 DIAGNOSIS — K5901 Slow transit constipation: Secondary | ICD-10-CM | POA: Diagnosis not present

## 2022-01-21 DIAGNOSIS — R195 Other fecal abnormalities: Secondary | ICD-10-CM | POA: Diagnosis not present

## 2022-01-21 DIAGNOSIS — M8000XD Age-related osteoporosis with current pathological fracture, unspecified site, subsequent encounter for fracture with routine healing: Secondary | ICD-10-CM | POA: Diagnosis not present

## 2022-01-21 DIAGNOSIS — N184 Chronic kidney disease, stage 4 (severe): Secondary | ICD-10-CM | POA: Diagnosis not present

## 2022-01-21 DIAGNOSIS — M159 Polyosteoarthritis, unspecified: Secondary | ICD-10-CM

## 2022-01-21 DIAGNOSIS — D649 Anemia, unspecified: Secondary | ICD-10-CM

## 2022-01-21 DIAGNOSIS — S72001D Fracture of unspecified part of neck of right femur, subsequent encounter for closed fracture with routine healing: Secondary | ICD-10-CM | POA: Diagnosis not present

## 2022-01-21 NOTE — Assessment & Plan Note (Signed)
taking Colace bid, prn MiraLax

## 2022-01-21 NOTE — Progress Notes (Addendum)
Location:   clinic [place]  Place of Service:  Clinic (12) Provider: Marlana Latus NP  Code Status: DNR Goals of Care: [place]    01/20/2022   10:52 AM  Advanced Directives  Does Patient Have a Medical Advance Directive? Yes  Type of Advance Directive Out of facility DNR (pink MOST or yellow form);Healthcare Power of Attorney  Does patient want to make changes to medical advance directive? No - Patient declined  Copy of [place] in Chart? Yes - validated most recent copy scanned in chart (See row information)  Pre-existing out of facility DNR order (yellow form or pink MOST form) Pink MOST form placed in chart (order not valid for inpatient use);Yellow form placed in chart (order not valid for inpatient use)     Chief Complaint  Patient presents with   Medical Management of Chronic Issues    Patient is here for a 65M F/U    HPI: Patient is a 88 y.o. female seen today for medical management of chronic diseases.      Anemia, chronic, acute blood loss after surgery, baseline Hgb 9s,  on Pantoprazole for GI protection, FOBT negative, Iron 112 08/26/21, Vit B12 480, EPO 9.9 09/15/21, On Fe, Hgb 7.9 12/04/21 Edema BLE, chronic, on Furosemide, 07/2021 venous US LLE negative DVT CKD Bun/creat  26/1.38 12/03/21, saw nephrology OA, Hx of R ankle fx, R hip total hip arthroplasty 11/30/21, ambulates with walker.              Her urinary frequency/leakage, uses pads, it seems better since taking Myrbetriq in pm per Urology             HTN, blood pressure is controlled, on Amlodipine 5mg  qd started 07/07/21 by Nephrology.              No constipation, taking Colace bid, prn MiraLax              Osteoporosis, off Alendronate due to CKD, 11/05/20 DEXA t score -2.8             Fecal incontinence prn Imodium   Past Medical History:  Diagnosis Date   Anemia    Chronic kidney disease    stage 3   GERD (gastroesophageal reflux disease)    Hypertension     Past Surgical History:  Procedure  Laterality Date   ORIF ANKLE FRACTURE Right 01/13/2021   Procedure: OPEN TREATMENT OF RIGHT TRIMALLEOLAR ANKLE FRACTURE WITHOUT POSTERIOR FIXATION, POSSIBLE SYNDESMOSIS;  Surgeon: Erle Crocker, MD;  Location: [place];  Service: Orthopedics;  Laterality: Right;   TOTAL HIP ARTHROPLASTY Right 12/02/2021   Procedure: TOTAL HIP ARTHROPLASTY;  Surgeon: Willaim Sheng, MD;  Location[facility];  Service: Orthopedics;  Laterality: Right;    Allergies  Allergen Reactions   Ace Inhibitors Other (See Comments)    Reaction unknown   Flagyl [Metronidazole] Other (See Comments)    Reaction unknown   Penicillins Swelling and Other (See Comments)    Facial swelling; can tolerate cephalosporins     Allergies as of 01/21/2022       Reactions   Ace Inhibitors Other (See Comments)   Reaction unknown   Flagyl [metronidazole] Other (See Comments)   Reaction unknown   Penicillins Swelling, Other (See Comments)   Facial swelling; can tolerate cephalosporins        Medication List        Accurate as of January 21, 2022 11:59 PM. If you have any questions, ask  your nurse or doctor.          STOP taking these medications    docusate sodium 100 MG capsule Commonly known as: COLACE Stopped by: Saul Fabiano X Carle Dargan, NP   ipratropium-albuterol 0.5-2.5 (3) MG/3ML Soln Commonly known as: DUONEB Stopped by: Kenesha Moshier X Chrisopher Pustejovsky, NP   oxyCODONE 5 MG immediate release tablet Commonly known as: Oxy IR/ROXICODONE Stopped by: Kadeidra Coryell X Zachari Alberta, NP   polyethylene glycol 17 g packet Commonly known as: MIRALAX / GLYCOLAX Stopped by: Sakib Noguez X Priseis Cratty, NP       TAKE these medications    acetaminophen 325 MG tablet Commonly known as: TYLENOL Take 2 tablets (650 mg total) by mouth every 6 (six) hours as needed for mild pain, fever or headache.   amLODipine 5 MG tablet Commonly known as: NORVASC Take 5 mg by mouth daily.   cholecalciferol 25 MCG (1000 UNIT) tablet Commonly known as: VITAMIN D3 Take 2 tablets (2,000  Units total) by mouth daily.   ferrous sulfate 325 (65 FE) MG tablet Take 325 mg by mouth daily with breakfast.   furosemide 20 MG tablet Commonly known as: LASIX Take 1 tablet (20 mg total) by mouth daily.   ICAPS AREDS 2 PO Take 1 capsule by mouth daily with breakfast.   Myrbetriq 25 MG Tb24 tablet Generic drug: mirabegron ER Take 25 mg by mouth daily.   pantoprazole 40 MG tablet Commonly known as: PROTONIX Take 1 tablet (40 mg total) by mouth daily.   vitamin C 250 MG tablet Commonly known as: ASCORBIC ACID Take 1 tablet (250 mg total) by mouth daily.        Review of Systems:  Review of Systems  Constitutional:  Negative for fatigue, fever and unexpected weight change.  HENT:  Positive for hearing loss. Negative for congestion and voice change.   Eyes:  Negative for visual disturbance.  Respiratory:  Negative for shortness of breath.   Cardiovascular:  Positive for leg swelling.  Gastrointestinal:  Negative for abdominal pain and constipation.       Fecal incontinent.   Genitourinary:  Positive for frequency. Negative for dysuria and urgency.       Occasionally incontinent of urine. Urination average every 4 hrs during day 1x/night.   Musculoskeletal:  Positive for arthralgias and gait problem.       Right lower leg/ankle brace  Skin:  Negative for color change.       Left calf skin tear is healed.   Neurological:  Negative for speech difficulty, weakness and light-headedness.       Memory lapses.   Psychiatric/Behavioral:  Negative for behavioral problems and sleep disturbance. The patient is not nervous/anxious.     Health Maintenance  Topic Date Due   COVID-19 Vaccine (3 - Moderna risk series) 11/25/2020   INFLUENZA VACCINE  01/12/2022   Zoster Vaccines- Shingrix (1 of 2) 06/14/2022 (Originally 05/04/1948)   TETANUS/TDAP  11/26/2029   Pneumonia Vaccine 1+ Years old  Completed   DEXA SCAN  Completed   HPV VACCINES  Aged Out    Physical Exam: Vitals:    01/21/22 1343  BP: 110/60  Pulse: 96  Resp: 20  Temp: (!) 97.1 F (36.2 C)  TempSrc: Temporal  SpO2: 99%  Weight: 120 lb (54.4 kg)  Height: 5\' 5"  (1.651 m)   Body mass index is 19.97 kg/m. Physical Exam Vitals reviewed.  Constitutional:      Appearance: Normal appearance.  HENT:     Head: Normocephalic and atraumatic.  Nose: Nose normal.     Mouth/Throat:     Mouth: Mucous membranes are moist.  Eyes:     Extraocular Movements: Extraocular movements intact.     Conjunctiva/sclera: Conjunctivae normal.     Pupils: Pupils are equal, round, and reactive to light.  Cardiovascular:     Rate and Rhythm: Normal rate and regular rhythm.     Heart sounds: No murmur heard. Pulmonary:     Breath sounds: No rales.  Abdominal:     General: Bowel sounds are normal.     Palpations: Abdomen is soft.     Tenderness: There is no abdominal tenderness.  Musculoskeletal:        General: No tenderness.     Cervical back: Normal range of motion and neck supple.     Right lower leg: Edema present.     Left lower leg: Edema present.     Comments: R ankle s/p ORIF 01/13/21. Edema BLE trace. THR 11/2021  Skin:    General: Skin is warm and dry.     Comments: Chronic pigmented venous insufficiency skin changes BLE. Blister left calf at the cuff of stocking.   Neurological:     Mental Status: She is alert. Mental status is at baseline.     Gait: Gait abnormal.     Comments: Oriented to person, place.   Psychiatric:        Mood and Affect: Mood normal.        Behavior: Behavior normal.        Thought Content: Thought content normal.     Labs reviewed: Basic Metabolic Panel: Recent Labs    12/01/21 0449 12/02/21 0353 12/03/21 0352  NA 137 135 135  K 3.6 3.4* 3.9  CL 103 100 103  CO2 24 24 25   GLUCOSE 103* 165* 126*  BUN 26* 33* 28*  CREATININE 1.48* 1.48* 1.38*  CALCIUM 9.5 9.7 9.0  MG  --  2.1 1.8   Liver Function Tests: Recent Labs    05/12/21 0715  AST 19  ALT 12   BILITOT 0.4  PROT 6.5   No results for input(s): "LIPASE", "AMYLASE" in the last 8760 hours. No results for input(s): "AMMONIA" in the last 8760 hours. CBC: Recent Labs    05/12/21 0715 09/15/21 0725 11/30/21 1149 12/01/21 0449 12/02/21 0353 12/03/21 0719 12/04/21 0744  WBC 5.9 5.6 7.0   < > 14.3* 12.4* 11.1*  NEUTROABS 3,292 2,929 5.0  --   --   --   --   HGB 9.3* 9.4* 10.5*   < > 10.9* 8.7* 7.9*  HCT 28.2* 29.2* 32.7*   < > 33.5* 26.5* 24.3*  MCV 95.6 95.4 98.8   < > 98.0 97.8 98.8  PLT 246 230 231   < > 237 172 167   < > = values in this interval not displayed.   Lipid Panel: No results for input(s): "CHOL", "HDL", "LDLCALC", "TRIG", "CHOLHDL", "LDLDIRECT" in the last 8760 hours. No results found for: "HGBA1C"  Procedures since last visit: No results found.  Assessment/Plan  Anemia  chronic, acute blood loss after surgery, baseline Hgb 9s,  on Pantoprazole for GI protection, FOBT negative, Iron 112 08/26/21, Vit B12 480, EPO 9.9 09/15/21, On Fe, Hgb 7.9 12/04/21  Venous insufficiency (chronic) (peripheral) chronic, on Furosemide, 07/2021 venous US LLE negative DVT  CKD (chronic kidney disease) stage 4, GFR 15-29 ml/min ([place])  26/1.38 12/03/21, saw nephrology  Osteoarthritis, multiple sites  Hx of R ankle fx, R  hip total hip arthroplasty 11/30/21, ambulates with walker.   Incontinent of urine Her urinary frequency/leakage, uses pads, it seems better since taking Myrbetriq in pm per Urology  Hypertension blood pressure is controlled, on Amlodipine 5mg  qd started 07/07/21 by Nephrology.   Slow transit constipation  taking Colace bid, prn MiraLax  Osteoporosis off Alendronate due to CKD, 11/05/20 DEXA t score -2.8  Loose stools Fecal incontinence prn Imodium   Labs/tests ordered: CBC/diff 3 months prior to the appointment Next appt:  3 months.

## 2022-01-21 NOTE — Assessment & Plan Note (Signed)
chronic, on Furosemide, 07/2021 venous US LLE negative DVT

## 2022-01-21 NOTE — Assessment & Plan Note (Signed)
chronic, acute blood loss after surgery, baseline Hgb 9s,  on Pantoprazole for GI protection, FOBT negative, Iron 112 08/26/21, Vit B12 480, EPO 9.9 09/15/21, On Fe, Hgb 7.9 12/04/21

## 2022-01-21 NOTE — Assessment & Plan Note (Signed)
Fecal incontinence prn Imodium

## 2022-01-21 NOTE — Assessment & Plan Note (Signed)
26/1.38 12/03/21, saw nephrology

## 2022-01-21 NOTE — Assessment & Plan Note (Signed)
Hx of R ankle fx, R hip total hip arthroplasty 11/30/21, ambulates with walker.

## 2022-01-21 NOTE — Assessment & Plan Note (Signed)
off Alendronate due to CKD, 11/05/20 DEXA t score -2.8

## 2022-01-21 NOTE — Assessment & Plan Note (Signed)
Her urinary frequency/leakage, uses pads, it seems better since taking Myrbetriq in pm per Urology

## 2022-01-21 NOTE — Assessment & Plan Note (Signed)
blood pressure is controlled, on Amlodipine 5mg  qd started 07/07/21 by Nephrology.

## 2022-01-25 ENCOUNTER — Encounter: Payer: Self-pay | Admitting: Nurse Practitioner

## 2022-01-25 DIAGNOSIS — Z9181 History of falling: Secondary | ICD-10-CM | POA: Diagnosis not present

## 2022-01-25 DIAGNOSIS — W19XXXD Unspecified fall, subsequent encounter: Secondary | ICD-10-CM | POA: Diagnosis not present

## 2022-01-25 DIAGNOSIS — R2681 Unsteadiness on feet: Secondary | ICD-10-CM | POA: Diagnosis not present

## 2022-01-25 DIAGNOSIS — M6281 Muscle weakness (generalized): Secondary | ICD-10-CM | POA: Diagnosis not present

## 2022-01-25 DIAGNOSIS — S72001D Fracture of unspecified part of neck of right femur, subsequent encounter for closed fracture with routine healing: Secondary | ICD-10-CM | POA: Diagnosis not present

## 2022-01-25 DIAGNOSIS — R41841 Cognitive communication deficit: Secondary | ICD-10-CM | POA: Diagnosis not present

## 2022-01-26 DIAGNOSIS — R2681 Unsteadiness on feet: Secondary | ICD-10-CM | POA: Diagnosis not present

## 2022-01-26 DIAGNOSIS — R41841 Cognitive communication deficit: Secondary | ICD-10-CM | POA: Diagnosis not present

## 2022-01-26 DIAGNOSIS — W19XXXD Unspecified fall, subsequent encounter: Secondary | ICD-10-CM | POA: Diagnosis not present

## 2022-01-26 DIAGNOSIS — Z9181 History of falling: Secondary | ICD-10-CM | POA: Diagnosis not present

## 2022-01-26 DIAGNOSIS — S72001D Fracture of unspecified part of neck of right femur, subsequent encounter for closed fracture with routine healing: Secondary | ICD-10-CM | POA: Diagnosis not present

## 2022-01-26 DIAGNOSIS — M6281 Muscle weakness (generalized): Secondary | ICD-10-CM | POA: Diagnosis not present

## 2022-01-27 DIAGNOSIS — R2681 Unsteadiness on feet: Secondary | ICD-10-CM | POA: Diagnosis not present

## 2022-01-27 DIAGNOSIS — W19XXXD Unspecified fall, subsequent encounter: Secondary | ICD-10-CM | POA: Diagnosis not present

## 2022-01-27 DIAGNOSIS — Z9181 History of falling: Secondary | ICD-10-CM | POA: Diagnosis not present

## 2022-01-27 DIAGNOSIS — R41841 Cognitive communication deficit: Secondary | ICD-10-CM | POA: Diagnosis not present

## 2022-01-27 DIAGNOSIS — S72001D Fracture of unspecified part of neck of right femur, subsequent encounter for closed fracture with routine healing: Secondary | ICD-10-CM | POA: Diagnosis not present

## 2022-01-27 DIAGNOSIS — M6281 Muscle weakness (generalized): Secondary | ICD-10-CM | POA: Diagnosis not present

## 2022-01-29 DIAGNOSIS — W19XXXD Unspecified fall, subsequent encounter: Secondary | ICD-10-CM | POA: Diagnosis not present

## 2022-01-29 DIAGNOSIS — S72001D Fracture of unspecified part of neck of right femur, subsequent encounter for closed fracture with routine healing: Secondary | ICD-10-CM | POA: Diagnosis not present

## 2022-01-29 DIAGNOSIS — M6281 Muscle weakness (generalized): Secondary | ICD-10-CM | POA: Diagnosis not present

## 2022-01-29 DIAGNOSIS — R41841 Cognitive communication deficit: Secondary | ICD-10-CM | POA: Diagnosis not present

## 2022-01-29 DIAGNOSIS — R2681 Unsteadiness on feet: Secondary | ICD-10-CM | POA: Diagnosis not present

## 2022-01-29 DIAGNOSIS — Z9181 History of falling: Secondary | ICD-10-CM | POA: Diagnosis not present

## 2022-02-01 DIAGNOSIS — Z9181 History of falling: Secondary | ICD-10-CM | POA: Diagnosis not present

## 2022-02-01 DIAGNOSIS — W19XXXD Unspecified fall, subsequent encounter: Secondary | ICD-10-CM | POA: Diagnosis not present

## 2022-02-01 DIAGNOSIS — M6281 Muscle weakness (generalized): Secondary | ICD-10-CM | POA: Diagnosis not present

## 2022-02-01 DIAGNOSIS — S72001D Fracture of unspecified part of neck of right femur, subsequent encounter for closed fracture with routine healing: Secondary | ICD-10-CM | POA: Diagnosis not present

## 2022-02-01 DIAGNOSIS — R2681 Unsteadiness on feet: Secondary | ICD-10-CM | POA: Diagnosis not present

## 2022-02-01 DIAGNOSIS — R41841 Cognitive communication deficit: Secondary | ICD-10-CM | POA: Diagnosis not present

## 2022-02-03 DIAGNOSIS — M6281 Muscle weakness (generalized): Secondary | ICD-10-CM | POA: Diagnosis not present

## 2022-02-03 DIAGNOSIS — R41841 Cognitive communication deficit: Secondary | ICD-10-CM | POA: Diagnosis not present

## 2022-02-03 DIAGNOSIS — W19XXXD Unspecified fall, subsequent encounter: Secondary | ICD-10-CM | POA: Diagnosis not present

## 2022-02-03 DIAGNOSIS — R2681 Unsteadiness on feet: Secondary | ICD-10-CM | POA: Diagnosis not present

## 2022-02-03 DIAGNOSIS — S72001D Fracture of unspecified part of neck of right femur, subsequent encounter for closed fracture with routine healing: Secondary | ICD-10-CM | POA: Diagnosis not present

## 2022-02-03 DIAGNOSIS — Z9181 History of falling: Secondary | ICD-10-CM | POA: Diagnosis not present

## 2022-02-04 DIAGNOSIS — W19XXXD Unspecified fall, subsequent encounter: Secondary | ICD-10-CM | POA: Diagnosis not present

## 2022-02-04 DIAGNOSIS — R2681 Unsteadiness on feet: Secondary | ICD-10-CM | POA: Diagnosis not present

## 2022-02-04 DIAGNOSIS — M6281 Muscle weakness (generalized): Secondary | ICD-10-CM | POA: Diagnosis not present

## 2022-02-04 DIAGNOSIS — Z9181 History of falling: Secondary | ICD-10-CM | POA: Diagnosis not present

## 2022-02-04 DIAGNOSIS — R41841 Cognitive communication deficit: Secondary | ICD-10-CM | POA: Diagnosis not present

## 2022-02-04 DIAGNOSIS — S72001D Fracture of unspecified part of neck of right femur, subsequent encounter for closed fracture with routine healing: Secondary | ICD-10-CM | POA: Diagnosis not present

## 2022-02-08 DIAGNOSIS — R41841 Cognitive communication deficit: Secondary | ICD-10-CM | POA: Diagnosis not present

## 2022-02-08 DIAGNOSIS — W19XXXD Unspecified fall, subsequent encounter: Secondary | ICD-10-CM | POA: Diagnosis not present

## 2022-02-08 DIAGNOSIS — M6281 Muscle weakness (generalized): Secondary | ICD-10-CM | POA: Diagnosis not present

## 2022-02-08 DIAGNOSIS — R2681 Unsteadiness on feet: Secondary | ICD-10-CM | POA: Diagnosis not present

## 2022-02-08 DIAGNOSIS — S72001D Fracture of unspecified part of neck of right femur, subsequent encounter for closed fracture with routine healing: Secondary | ICD-10-CM | POA: Diagnosis not present

## 2022-02-08 DIAGNOSIS — Z9181 History of falling: Secondary | ICD-10-CM | POA: Diagnosis not present

## 2022-02-10 DIAGNOSIS — S72001D Fracture of unspecified part of neck of right femur, subsequent encounter for closed fracture with routine healing: Secondary | ICD-10-CM | POA: Diagnosis not present

## 2022-02-10 DIAGNOSIS — M6281 Muscle weakness (generalized): Secondary | ICD-10-CM | POA: Diagnosis not present

## 2022-02-10 DIAGNOSIS — Z9181 History of falling: Secondary | ICD-10-CM | POA: Diagnosis not present

## 2022-02-10 DIAGNOSIS — R41841 Cognitive communication deficit: Secondary | ICD-10-CM | POA: Diagnosis not present

## 2022-02-10 DIAGNOSIS — W19XXXD Unspecified fall, subsequent encounter: Secondary | ICD-10-CM | POA: Diagnosis not present

## 2022-02-10 DIAGNOSIS — R2681 Unsteadiness on feet: Secondary | ICD-10-CM | POA: Diagnosis not present

## 2022-02-11 DIAGNOSIS — M6281 Muscle weakness (generalized): Secondary | ICD-10-CM | POA: Diagnosis not present

## 2022-02-11 DIAGNOSIS — R41841 Cognitive communication deficit: Secondary | ICD-10-CM | POA: Diagnosis not present

## 2022-02-11 DIAGNOSIS — R2681 Unsteadiness on feet: Secondary | ICD-10-CM | POA: Diagnosis not present

## 2022-02-11 DIAGNOSIS — W19XXXD Unspecified fall, subsequent encounter: Secondary | ICD-10-CM | POA: Diagnosis not present

## 2022-02-11 DIAGNOSIS — S72001D Fracture of unspecified part of neck of right femur, subsequent encounter for closed fracture with routine healing: Secondary | ICD-10-CM | POA: Diagnosis not present

## 2022-02-11 DIAGNOSIS — Z9181 History of falling: Secondary | ICD-10-CM | POA: Diagnosis not present

## 2022-02-16 DIAGNOSIS — R2681 Unsteadiness on feet: Secondary | ICD-10-CM | POA: Diagnosis not present

## 2022-02-16 DIAGNOSIS — Z9181 History of falling: Secondary | ICD-10-CM | POA: Diagnosis not present

## 2022-02-16 DIAGNOSIS — W19XXXD Unspecified fall, subsequent encounter: Secondary | ICD-10-CM | POA: Diagnosis not present

## 2022-02-16 DIAGNOSIS — N3946 Mixed incontinence: Secondary | ICD-10-CM | POA: Diagnosis not present

## 2022-02-16 DIAGNOSIS — R41841 Cognitive communication deficit: Secondary | ICD-10-CM | POA: Diagnosis not present

## 2022-02-16 DIAGNOSIS — M6281 Muscle weakness (generalized): Secondary | ICD-10-CM | POA: Diagnosis not present

## 2022-02-16 DIAGNOSIS — S72001D Fracture of unspecified part of neck of right femur, subsequent encounter for closed fracture with routine healing: Secondary | ICD-10-CM | POA: Diagnosis not present

## 2022-02-16 DIAGNOSIS — Z96641 Presence of right artificial hip joint: Secondary | ICD-10-CM | POA: Diagnosis not present

## 2022-02-16 DIAGNOSIS — R29898 Other symptoms and signs involving the musculoskeletal system: Secondary | ICD-10-CM | POA: Diagnosis not present

## 2022-02-17 DIAGNOSIS — M6281 Muscle weakness (generalized): Secondary | ICD-10-CM | POA: Diagnosis not present

## 2022-02-17 DIAGNOSIS — Z9181 History of falling: Secondary | ICD-10-CM | POA: Diagnosis not present

## 2022-02-17 DIAGNOSIS — R2681 Unsteadiness on feet: Secondary | ICD-10-CM | POA: Diagnosis not present

## 2022-02-17 DIAGNOSIS — W19XXXD Unspecified fall, subsequent encounter: Secondary | ICD-10-CM | POA: Diagnosis not present

## 2022-02-17 DIAGNOSIS — S72001D Fracture of unspecified part of neck of right femur, subsequent encounter for closed fracture with routine healing: Secondary | ICD-10-CM | POA: Diagnosis not present

## 2022-02-17 DIAGNOSIS — R41841 Cognitive communication deficit: Secondary | ICD-10-CM | POA: Diagnosis not present

## 2022-02-19 DIAGNOSIS — M6281 Muscle weakness (generalized): Secondary | ICD-10-CM | POA: Diagnosis not present

## 2022-02-19 DIAGNOSIS — R2681 Unsteadiness on feet: Secondary | ICD-10-CM | POA: Diagnosis not present

## 2022-02-19 DIAGNOSIS — W19XXXD Unspecified fall, subsequent encounter: Secondary | ICD-10-CM | POA: Diagnosis not present

## 2022-02-19 DIAGNOSIS — S72001D Fracture of unspecified part of neck of right femur, subsequent encounter for closed fracture with routine healing: Secondary | ICD-10-CM | POA: Diagnosis not present

## 2022-02-19 DIAGNOSIS — R41841 Cognitive communication deficit: Secondary | ICD-10-CM | POA: Diagnosis not present

## 2022-02-19 DIAGNOSIS — Z9181 History of falling: Secondary | ICD-10-CM | POA: Diagnosis not present

## 2022-02-22 DIAGNOSIS — R41841 Cognitive communication deficit: Secondary | ICD-10-CM | POA: Diagnosis not present

## 2022-02-22 DIAGNOSIS — M6281 Muscle weakness (generalized): Secondary | ICD-10-CM | POA: Diagnosis not present

## 2022-02-22 DIAGNOSIS — W19XXXD Unspecified fall, subsequent encounter: Secondary | ICD-10-CM | POA: Diagnosis not present

## 2022-02-22 DIAGNOSIS — R2681 Unsteadiness on feet: Secondary | ICD-10-CM | POA: Diagnosis not present

## 2022-02-22 DIAGNOSIS — S72001D Fracture of unspecified part of neck of right femur, subsequent encounter for closed fracture with routine healing: Secondary | ICD-10-CM | POA: Diagnosis not present

## 2022-02-22 DIAGNOSIS — Z9181 History of falling: Secondary | ICD-10-CM | POA: Diagnosis not present

## 2022-02-23 DIAGNOSIS — D692 Other nonthrombocytopenic purpura: Secondary | ICD-10-CM | POA: Diagnosis not present

## 2022-02-23 DIAGNOSIS — Z85068 Personal history of other malignant neoplasm of small intestine: Secondary | ICD-10-CM | POA: Diagnosis not present

## 2022-02-23 DIAGNOSIS — Z9181 History of falling: Secondary | ICD-10-CM | POA: Diagnosis not present

## 2022-02-23 DIAGNOSIS — W19XXXD Unspecified fall, subsequent encounter: Secondary | ICD-10-CM | POA: Diagnosis not present

## 2022-02-23 DIAGNOSIS — R2681 Unsteadiness on feet: Secondary | ICD-10-CM | POA: Diagnosis not present

## 2022-02-23 DIAGNOSIS — L570 Actinic keratosis: Secondary | ICD-10-CM | POA: Diagnosis not present

## 2022-02-23 DIAGNOSIS — S72001D Fracture of unspecified part of neck of right femur, subsequent encounter for closed fracture with routine healing: Secondary | ICD-10-CM | POA: Diagnosis not present

## 2022-02-23 DIAGNOSIS — C4492 Squamous cell carcinoma of skin, unspecified: Secondary | ICD-10-CM | POA: Diagnosis not present

## 2022-02-23 DIAGNOSIS — R41841 Cognitive communication deficit: Secondary | ICD-10-CM | POA: Diagnosis not present

## 2022-02-23 DIAGNOSIS — L814 Other melanin hyperpigmentation: Secondary | ICD-10-CM | POA: Diagnosis not present

## 2022-02-23 DIAGNOSIS — M6281 Muscle weakness (generalized): Secondary | ICD-10-CM | POA: Diagnosis not present

## 2022-02-24 DIAGNOSIS — R8271 Bacteriuria: Secondary | ICD-10-CM | POA: Diagnosis not present

## 2022-02-24 DIAGNOSIS — M6281 Muscle weakness (generalized): Secondary | ICD-10-CM | POA: Diagnosis not present

## 2022-02-24 DIAGNOSIS — Z9181 History of falling: Secondary | ICD-10-CM | POA: Diagnosis not present

## 2022-02-24 DIAGNOSIS — R3915 Urgency of urination: Secondary | ICD-10-CM | POA: Diagnosis not present

## 2022-02-24 DIAGNOSIS — R41841 Cognitive communication deficit: Secondary | ICD-10-CM | POA: Diagnosis not present

## 2022-02-24 DIAGNOSIS — R351 Nocturia: Secondary | ICD-10-CM | POA: Diagnosis not present

## 2022-02-24 DIAGNOSIS — R2681 Unsteadiness on feet: Secondary | ICD-10-CM | POA: Diagnosis not present

## 2022-02-24 DIAGNOSIS — S72001D Fracture of unspecified part of neck of right femur, subsequent encounter for closed fracture with routine healing: Secondary | ICD-10-CM | POA: Diagnosis not present

## 2022-02-24 DIAGNOSIS — N3941 Urge incontinence: Secondary | ICD-10-CM | POA: Diagnosis not present

## 2022-02-24 DIAGNOSIS — W19XXXD Unspecified fall, subsequent encounter: Secondary | ICD-10-CM | POA: Diagnosis not present

## 2022-02-26 DIAGNOSIS — R2681 Unsteadiness on feet: Secondary | ICD-10-CM | POA: Diagnosis not present

## 2022-02-26 DIAGNOSIS — W19XXXD Unspecified fall, subsequent encounter: Secondary | ICD-10-CM | POA: Diagnosis not present

## 2022-02-26 DIAGNOSIS — Z9181 History of falling: Secondary | ICD-10-CM | POA: Diagnosis not present

## 2022-02-26 DIAGNOSIS — M6281 Muscle weakness (generalized): Secondary | ICD-10-CM | POA: Diagnosis not present

## 2022-02-26 DIAGNOSIS — R41841 Cognitive communication deficit: Secondary | ICD-10-CM | POA: Diagnosis not present

## 2022-02-26 DIAGNOSIS — S72001D Fracture of unspecified part of neck of right femur, subsequent encounter for closed fracture with routine healing: Secondary | ICD-10-CM | POA: Diagnosis not present

## 2022-03-02 DIAGNOSIS — W19XXXD Unspecified fall, subsequent encounter: Secondary | ICD-10-CM | POA: Diagnosis not present

## 2022-03-02 DIAGNOSIS — S72001D Fracture of unspecified part of neck of right femur, subsequent encounter for closed fracture with routine healing: Secondary | ICD-10-CM | POA: Diagnosis not present

## 2022-03-02 DIAGNOSIS — R41841 Cognitive communication deficit: Secondary | ICD-10-CM | POA: Diagnosis not present

## 2022-03-02 DIAGNOSIS — R2681 Unsteadiness on feet: Secondary | ICD-10-CM | POA: Diagnosis not present

## 2022-03-02 DIAGNOSIS — M6281 Muscle weakness (generalized): Secondary | ICD-10-CM | POA: Diagnosis not present

## 2022-03-02 DIAGNOSIS — Z9181 History of falling: Secondary | ICD-10-CM | POA: Diagnosis not present

## 2022-03-03 DIAGNOSIS — M6281 Muscle weakness (generalized): Secondary | ICD-10-CM | POA: Diagnosis not present

## 2022-03-03 DIAGNOSIS — Z9181 History of falling: Secondary | ICD-10-CM | POA: Diagnosis not present

## 2022-03-03 DIAGNOSIS — W19XXXD Unspecified fall, subsequent encounter: Secondary | ICD-10-CM | POA: Diagnosis not present

## 2022-03-03 DIAGNOSIS — R2681 Unsteadiness on feet: Secondary | ICD-10-CM | POA: Diagnosis not present

## 2022-03-03 DIAGNOSIS — S72001D Fracture of unspecified part of neck of right femur, subsequent encounter for closed fracture with routine healing: Secondary | ICD-10-CM | POA: Diagnosis not present

## 2022-03-03 DIAGNOSIS — R41841 Cognitive communication deficit: Secondary | ICD-10-CM | POA: Diagnosis not present

## 2022-03-05 DIAGNOSIS — M6281 Muscle weakness (generalized): Secondary | ICD-10-CM | POA: Diagnosis not present

## 2022-03-05 DIAGNOSIS — R41841 Cognitive communication deficit: Secondary | ICD-10-CM | POA: Diagnosis not present

## 2022-03-05 DIAGNOSIS — W19XXXD Unspecified fall, subsequent encounter: Secondary | ICD-10-CM | POA: Diagnosis not present

## 2022-03-05 DIAGNOSIS — Z9181 History of falling: Secondary | ICD-10-CM | POA: Diagnosis not present

## 2022-03-05 DIAGNOSIS — R2681 Unsteadiness on feet: Secondary | ICD-10-CM | POA: Diagnosis not present

## 2022-03-05 DIAGNOSIS — S72001D Fracture of unspecified part of neck of right femur, subsequent encounter for closed fracture with routine healing: Secondary | ICD-10-CM | POA: Diagnosis not present

## 2022-03-09 DIAGNOSIS — S72001D Fracture of unspecified part of neck of right femur, subsequent encounter for closed fracture with routine healing: Secondary | ICD-10-CM | POA: Diagnosis not present

## 2022-03-09 DIAGNOSIS — R41841 Cognitive communication deficit: Secondary | ICD-10-CM | POA: Diagnosis not present

## 2022-03-09 DIAGNOSIS — Z9181 History of falling: Secondary | ICD-10-CM | POA: Diagnosis not present

## 2022-03-09 DIAGNOSIS — W19XXXD Unspecified fall, subsequent encounter: Secondary | ICD-10-CM | POA: Diagnosis not present

## 2022-03-09 DIAGNOSIS — R2681 Unsteadiness on feet: Secondary | ICD-10-CM | POA: Diagnosis not present

## 2022-03-09 DIAGNOSIS — M6281 Muscle weakness (generalized): Secondary | ICD-10-CM | POA: Diagnosis not present

## 2022-03-10 DIAGNOSIS — R2681 Unsteadiness on feet: Secondary | ICD-10-CM | POA: Diagnosis not present

## 2022-03-10 DIAGNOSIS — R41841 Cognitive communication deficit: Secondary | ICD-10-CM | POA: Diagnosis not present

## 2022-03-10 DIAGNOSIS — S72001D Fracture of unspecified part of neck of right femur, subsequent encounter for closed fracture with routine healing: Secondary | ICD-10-CM | POA: Diagnosis not present

## 2022-03-10 DIAGNOSIS — Z9181 History of falling: Secondary | ICD-10-CM | POA: Diagnosis not present

## 2022-03-10 DIAGNOSIS — W19XXXD Unspecified fall, subsequent encounter: Secondary | ICD-10-CM | POA: Diagnosis not present

## 2022-03-10 DIAGNOSIS — M6281 Muscle weakness (generalized): Secondary | ICD-10-CM | POA: Diagnosis not present

## 2022-03-11 DIAGNOSIS — I872 Venous insufficiency (chronic) (peripheral): Secondary | ICD-10-CM | POA: Diagnosis not present

## 2022-03-11 DIAGNOSIS — C44722 Squamous cell carcinoma of skin of right lower limb, including hip: Secondary | ICD-10-CM | POA: Diagnosis not present

## 2022-03-12 DIAGNOSIS — R41841 Cognitive communication deficit: Secondary | ICD-10-CM | POA: Diagnosis not present

## 2022-03-12 DIAGNOSIS — Z9181 History of falling: Secondary | ICD-10-CM | POA: Diagnosis not present

## 2022-03-12 DIAGNOSIS — S72001D Fracture of unspecified part of neck of right femur, subsequent encounter for closed fracture with routine healing: Secondary | ICD-10-CM | POA: Diagnosis not present

## 2022-03-12 DIAGNOSIS — M6281 Muscle weakness (generalized): Secondary | ICD-10-CM | POA: Diagnosis not present

## 2022-03-12 DIAGNOSIS — W19XXXD Unspecified fall, subsequent encounter: Secondary | ICD-10-CM | POA: Diagnosis not present

## 2022-03-12 DIAGNOSIS — R2681 Unsteadiness on feet: Secondary | ICD-10-CM | POA: Diagnosis not present

## 2022-03-23 DIAGNOSIS — L858 Other specified epidermal thickening: Secondary | ICD-10-CM | POA: Diagnosis not present

## 2022-03-23 DIAGNOSIS — C4492 Squamous cell carcinoma of skin, unspecified: Secondary | ICD-10-CM | POA: Diagnosis not present

## 2022-03-23 DIAGNOSIS — Z85068 Personal history of other malignant neoplasm of small intestine: Secondary | ICD-10-CM | POA: Diagnosis not present

## 2022-03-23 DIAGNOSIS — L570 Actinic keratosis: Secondary | ICD-10-CM | POA: Diagnosis not present

## 2022-03-30 DIAGNOSIS — Z23 Encounter for immunization: Secondary | ICD-10-CM | POA: Diagnosis not present

## 2022-04-01 ENCOUNTER — Encounter: Payer: Self-pay | Admitting: Nurse Practitioner

## 2022-04-01 ENCOUNTER — Non-Acute Institutional Stay: Payer: Medicare Other | Admitting: Nurse Practitioner

## 2022-04-01 DIAGNOSIS — L03116 Cellulitis of left lower limb: Secondary | ICD-10-CM | POA: Diagnosis not present

## 2022-04-01 DIAGNOSIS — M159 Polyosteoarthritis, unspecified: Secondary | ICD-10-CM

## 2022-04-01 DIAGNOSIS — N184 Chronic kidney disease, stage 4 (severe): Secondary | ICD-10-CM

## 2022-04-01 DIAGNOSIS — M8000XD Age-related osteoporosis with current pathological fracture, unspecified site, subsequent encounter for fracture with routine healing: Secondary | ICD-10-CM | POA: Diagnosis not present

## 2022-04-01 DIAGNOSIS — R238 Other skin changes: Secondary | ICD-10-CM | POA: Diagnosis not present

## 2022-04-01 DIAGNOSIS — D649 Anemia, unspecified: Secondary | ICD-10-CM

## 2022-04-01 DIAGNOSIS — N3942 Incontinence without sensory awareness: Secondary | ICD-10-CM

## 2022-04-01 DIAGNOSIS — I10 Essential (primary) hypertension: Secondary | ICD-10-CM

## 2022-04-01 DIAGNOSIS — K5901 Slow transit constipation: Secondary | ICD-10-CM | POA: Diagnosis not present

## 2022-04-01 DIAGNOSIS — I872 Venous insufficiency (chronic) (peripheral): Secondary | ICD-10-CM

## 2022-04-01 MED ORDER — DOXYCYCLINE HYCLATE 100 MG PO TABS: 100.0000 mg | ORAL_TABLET | Freq: Two times a day (BID) | ORAL | 0 refills | Status: AC

## 2022-04-01 MED ORDER — PREDNISONE 10 MG PO TABS: 5.0000 mg | ORAL_TABLET | Freq: Every day | ORAL | 0 refills | Status: AC

## 2022-04-01 NOTE — Assessment & Plan Note (Signed)
Her urinary frequency/leakage, uses pads, it seems better since taking Myrbetriq in pm per Urology

## 2022-04-01 NOTE — Assessment & Plan Note (Signed)
Anterior medial left lower leg open areas, redness, warmth, swelling noted. Will treat with Doxycycline 100mg  bid x 7 days, increase Furosemide to 40mg  qd x3 days, then resume 20mg  qd.

## 2022-04-01 NOTE — Assessment & Plan Note (Signed)
taking Colace bid, prn MiraLax

## 2022-04-01 NOTE — Assessment & Plan Note (Signed)
chronic, acute blood loss after surgery, baseline Hgb 9s,  on Pantoprazole for GI protection, FOBT negative, Iron 112 08/26/21, Vit B12 480, EPO 9.9 09/15/21, On Fe, Hgb 7.9 12/04/21

## 2022-04-01 NOTE — Assessment & Plan Note (Signed)
blood pressure is controlled, on Amlodipine 5mg  qd started 07/07/21 by Nephrology.

## 2022-04-01 NOTE — Assessment & Plan Note (Signed)
off Alendronate due to CKD, 11/05/20 DEXA t score -2.8

## 2022-04-01 NOTE — Assessment & Plan Note (Signed)
chronic, on Furosemide, 07/2021 venous US LLE negative DVT

## 2022-04-01 NOTE — Assessment & Plan Note (Signed)
Blisters small 2x left medial thigh above the left knee, 1x below the left knee, denied pain, itching, or discomfort, will treat with Prednisone 5mg  qd x 2 weeks.

## 2022-04-01 NOTE — Assessment & Plan Note (Signed)
Hx of R ankle fx, R hip total hip arthroplasty 11/30/21, ambulates with walker.

## 2022-04-01 NOTE — Assessment & Plan Note (Signed)
Bun/creat  26/1.38 12/03/21, saw nephrology

## 2022-04-01 NOTE — Progress Notes (Signed)
Location:   clinic [place]   Place of Service:  Clinic (12) Provider: Marlana Latus NP  Code Status: DNR Goals of Care: [place]    01/20/2022   10:52 AM  Advanced Directives  Does Patient Have a Medical Advance Directive? Yes  Type of Advance Directive Out of facility DNR (pink MOST or yellow form);Healthcare Power of Attorney  Does patient want to make changes to medical advance directive? No - Patient declined  Copy of [place] in Chart? Yes - validated most recent copy scanned in chart (See row information)  Pre-existing out of facility DNR order (yellow form or pink MOST form) Pink MOST form placed in chart (order not valid for inpatient use);Yellow form placed in chart (order not valid for inpatient use)     Chief Complaint  Patient presents with   Acute Visit    Patient is being seen for blisters/boils on lower left extremities Patient is also due for covid and flu vaccine    HPI: Patient is a 88 y.o. female seen today for medical management of chronic diseases.    Blisters small 2x left medial thigh above the left knee, 1x below the left knee, denied pain, itching, or discomfort. Anterior medial left lower leg open areas, redness, warmth, swelling noted.  Anemia, chronic, acute blood loss after surgery, baseline Hgb 9s,  on Pantoprazole for GI protection, FOBT negative, Iron 112 08/26/21, Vit B12 480, EPO 9.9 09/15/21, On Fe, Hgb 7.9 12/04/21 Edema BLE, chronic, on Furosemide, 07/2021 venous US LLE negative DVT CKD Bun/creat  26/1.38 12/03/21, saw nephrology OA, Hx of R ankle fx, R hip total hip arthroplasty 11/30/21, ambulates with walker.              Her urinary frequency/leakage, uses pads, it seems better since taking Myrbetriq in pm per Urology             HTN, blood pressure is controlled, on Amlodipine 5mg  qd started 07/07/21 by Nephrology.              No constipation, taking Colace bid, prn MiraLax              Osteoporosis, off Alendronate due to CKD, 11/05/20  DEXA t score -2.8             Fecal incontinence prn Imodium Past Medical History:  Diagnosis Date   Anemia    Chronic kidney disease    stage 3   GERD (gastroesophageal reflux disease)    Hypertension     Past Surgical History:  Procedure Laterality Date   ORIF ANKLE FRACTURE Right 01/13/2021   Procedure: OPEN TREATMENT OF RIGHT TRIMALLEOLAR ANKLE FRACTURE WITHOUT POSTERIOR FIXATION, POSSIBLE SYNDESMOSIS;  Surgeon: Erle Crocker, MD;  Location: [facility];  Service: Orthopedics;  Laterality: Right;   TOTAL HIP ARTHROPLASTY Right 12/02/2021   Procedure: TOTAL HIP ARTHROPLASTY;  Surgeon: Willaim Sheng, MD;  Location: [facility];  Service: Orthopedics;  Laterality: Right;    Allergies  Allergen Reactions   Ace Inhibitors Other (See Comments)    Reaction unknown   Flagyl [Metronidazole] Other (See Comments)    Reaction unknown   Penicillins Swelling and Other (See Comments)    Facial swelling; can tolerate cephalosporins     Allergies as of 04/01/2022       Reactions   Ace Inhibitors Other (See Comments)   Reaction unknown   Flagyl [metronidazole] Other (See Comments)   Reaction unknown   Penicillins Swelling, Other (See  Comments)   Facial swelling; can tolerate cephalosporins        Medication List        Accurate as of April 01, 2022 11:59 PM. If you have any questions, ask your nurse or doctor.          acetaminophen 325 MG tablet Commonly known as: TYLENOL Take 2 tablets (650 mg total) by mouth every 6 (six) hours as needed for mild pain, fever or headache.   amLODipine 5 MG tablet Commonly known as: NORVASC Take 5 mg by mouth daily.   cholecalciferol 25 MCG (1000 UNIT) tablet Commonly known as: VITAMIN D3 Take 2 tablets (2,000 Units total) by mouth daily.   doxycycline 100 MG tablet Commonly known as: VIBRA-TABS Take 1 tablet (100 mg total) by mouth 2 (two) times daily for 7 days. Started by: Deari Sessler X Carime Dinkel, NP   ferrous sulfate 325 (65 FE) MG  tablet Take 325 mg by mouth daily with breakfast.   furosemide 20 MG tablet Commonly known as: LASIX Take 1 tablet (20 mg total) by mouth daily.   ICAPS AREDS 2 PO Take 1 capsule by mouth daily with breakfast.   Myrbetriq 25 MG Tb24 tablet Generic drug: mirabegron ER Take 25 mg by mouth daily.   pantoprazole 40 MG tablet Commonly known as: PROTONIX Take 1 tablet (40 mg total) by mouth daily.   predniSONE 10 MG tablet Commonly known as: DELTASONE Take 0.5 tablets (5 mg total) by mouth daily with breakfast for 14 days. Started by: Philena Obey X Dominik Yordy, NP   vitamin C 250 MG tablet Commonly known as: ASCORBIC ACID Take 1 tablet (250 mg total) by mouth daily.        Review of Systems:  Review of Systems  Constitutional:  Negative for fatigue, fever and unexpected weight change.  HENT:  Positive for hearing loss. Negative for congestion and voice change.   Eyes:  Negative for visual disturbance.  Respiratory:  Negative for shortness of breath.   Cardiovascular:  Positive for leg swelling.  Gastrointestinal:  Negative for abdominal pain and constipation.       Fecal incontinent.   Genitourinary:  Positive for frequency. Negative for dysuria and urgency.       Occasionally incontinent of urine. Urination average every 4 hrs during day 1x/night.   Musculoskeletal:  Positive for arthralgias and gait problem.       Right lower leg/ankle brace  Skin:  Negative for rash.       Blisters left leg, red warmth swelling area anterior/medial LLE  Neurological:  Negative for speech difficulty, weakness and light-headedness.       Memory lapses.   Psychiatric/Behavioral:  Negative for behavioral problems and sleep disturbance. The patient is not nervous/anxious.     Health Maintenance  Topic Date Due   COVID-19 Vaccine (4 - Mixed Product risk series) 04/28/2021   INFLUENZA VACCINE  01/12/2022   Zoster Vaccines- Shingrix (1 of 2) 06/14/2022 (Originally 05/04/1948)   TETANUS/TDAP  11/26/2029    Pneumonia Vaccine 19+ Years old  Completed   DEXA SCAN  Completed   HPV VACCINES  Aged Out    Physical Exam: Vitals:   04/01/22 1446  BP: (!) 130/90  Pulse: 90  Resp: 18  Temp: 98 F (36.7 C)  SpO2: 93%  Weight: 117 lb (53.1 kg)  Height: 5\' 5"  (1.651 m)   Body mass index is 19.47 kg/m. Physical Exam Vitals reviewed.  Constitutional:      Appearance: Normal appearance.  HENT:     Head: Normocephalic and atraumatic.     Nose: Nose normal.     Mouth/Throat:     Mouth: Mucous membranes are moist.  Eyes:     Extraocular Movements: Extraocular movements intact.     Conjunctiva/sclera: Conjunctivae normal.     Pupils: Pupils are equal, round, and reactive to light.  Cardiovascular:     Rate and Rhythm: Normal rate and regular rhythm.     Heart sounds: No murmur heard. Pulmonary:     Breath sounds: No rales.  Abdominal:     General: Bowel sounds are normal.     Palpations: Abdomen is soft.     Tenderness: There is no abdominal tenderness.  Musculoskeletal:        General: No tenderness.     Cervical back: Normal range of motion and neck supple.     Right lower leg: Edema present.     Left lower leg: Edema present.     Comments: R ankle s/p ORIF 01/13/21. Edema BLE trace. THR 11/2021  Skin:    General: Skin is warm and dry.     Findings: Erythema present.     Comments: Chronic pigmented venous insufficiency skin changes BLE. Blisters small 2x left medial thigh above the left knee, 1x below the left knee, denied pain, itching, or discomfort.   Neurological:     Mental Status: She is alert. Mental status is at baseline.     Gait: Gait abnormal.     Comments: Oriented to person, place.   Psychiatric:        Mood and Affect: Mood normal.        Behavior: Behavior normal.        Thought Content: Thought content normal.     Labs reviewed: Basic Metabolic Panel: Recent Labs    12/01/21 0449 12/02/21 0353 12/03/21 0352  NA 137 135 135  K 3.6 3.4* 3.9  CL 103 100  103  CO2 24 24 25   GLUCOSE 103* 165* 126*  BUN 26* 33* 28*  CREATININE 1.48* 1.48* 1.38*  CALCIUM 9.5 9.7 9.0  MG  --  2.1 1.8   Liver Function Tests: Recent Labs    05/12/21 0715  AST 19  ALT 12  BILITOT 0.4  PROT 6.5   No results for input(s): "LIPASE", "AMYLASE" in the last 8760 hours. No results for input(s): "AMMONIA" in the last 8760 hours. CBC: Recent Labs    05/12/21 0715 09/15/21 0725 11/30/21 1149 12/01/21 0449 12/02/21 0353 12/03/21 0719 12/04/21 0744  WBC 5.9 5.6 7.0   < > 14.3* 12.4* 11.1*  NEUTROABS 3,292 2,929 5.0  --   --   --   --   HGB 9.3* 9.4* 10.5*   < > 10.9* 8.7* 7.9*  HCT 28.2* 29.2* 32.7*   < > 33.5* 26.5* 24.3*  MCV 95.6 95.4 98.8   < > 98.0 97.8 98.8  PLT 246 230 231   < > 237 172 167   < > = values in this interval not displayed.   Lipid Panel: No results for input(s): "CHOL", "HDL", "LDLCALC", "TRIG", "CHOLHDL", "LDLDIRECT" in the last 8760 hours. No results found for: "HGBA1C"  Procedures since last visit: No results found.  Assessment/Plan  Venous insufficiency (chronic) (peripheral) chronic, on Furosemide, 07/2021 venous US LLE negative DVT  CKD (chronic kidney disease) stage 4, GFR 15-29 ml/min (HCC)  Bun/creat  26/1.38 12/03/21, saw nephrology  Osteoarthritis, multiple sites Hx of R ankle fx, R hip  total hip arthroplasty 11/30/21, ambulates with walker.   Incontinent of urine  Her urinary frequency/leakage, uses pads, it seems better since taking Myrbetriq in pm per Urology  Hypertension blood pressure is controlled, on Amlodipine 5mg  qd started 07/07/21 by Nephrology.   Slow transit constipation taking Colace bid, prn MiraLax  Osteoporosis off Alendronate due to CKD, 11/05/20 DEXA t score -2.8  Anemia chronic, acute blood loss after surgery, baseline Hgb 9s,  on Pantoprazole for GI protection, FOBT negative, Iron 112 08/26/21, Vit B12 480, EPO 9.9 09/15/21, On Fe, Hgb 7.9 12/04/21  Blisters of multiple sites Blisters  small 2x left medial thigh above the left knee, 1x below the left knee, denied pain, itching, or discomfort, will treat with Prednisone 5mg  qd x 2 weeks.   Cellulitis of left anterior lower leg Anterior medial left lower leg open areas, redness, warmth, swelling noted. Will treat with Doxycycline 100mg  bid x 7 days, increase Furosemide to 40mg  qd x3 days, then resume 20mg  qd.    Labs/tests ordered:  none  Next appt:  04/22/2022

## 2022-04-02 ENCOUNTER — Encounter: Payer: Self-pay | Admitting: Nurse Practitioner

## 2022-04-09 ENCOUNTER — Other Ambulatory Visit: Payer: Self-pay | Admitting: Nurse Practitioner

## 2022-04-15 ENCOUNTER — Encounter: Payer: Self-pay | Admitting: Nurse Practitioner

## 2022-04-15 ENCOUNTER — Non-Acute Institutional Stay: Payer: Medicare Other | Admitting: Nurse Practitioner

## 2022-04-15 VITALS — BP 147/82 | HR 78 | Temp 97.3°F | Resp 18 | Ht 65.0 in | Wt 119.0 lb

## 2022-04-15 DIAGNOSIS — M159 Polyosteoarthritis, unspecified: Secondary | ICD-10-CM

## 2022-04-15 DIAGNOSIS — M8000XD Age-related osteoporosis with current pathological fracture, unspecified site, subsequent encounter for fracture with routine healing: Secondary | ICD-10-CM | POA: Diagnosis not present

## 2022-04-15 DIAGNOSIS — R1312 Dysphagia, oropharyngeal phase: Secondary | ICD-10-CM | POA: Diagnosis not present

## 2022-04-15 DIAGNOSIS — I10 Essential (primary) hypertension: Secondary | ICD-10-CM | POA: Diagnosis not present

## 2022-04-15 DIAGNOSIS — K5901 Slow transit constipation: Secondary | ICD-10-CM | POA: Diagnosis not present

## 2022-04-15 DIAGNOSIS — I872 Venous insufficiency (chronic) (peripheral): Secondary | ICD-10-CM

## 2022-04-15 DIAGNOSIS — R238 Other skin changes: Secondary | ICD-10-CM

## 2022-04-15 DIAGNOSIS — D649 Anemia, unspecified: Secondary | ICD-10-CM | POA: Diagnosis not present

## 2022-04-15 DIAGNOSIS — L03116 Cellulitis of left lower limb: Secondary | ICD-10-CM

## 2022-04-15 DIAGNOSIS — N184 Chronic kidney disease, stage 4 (severe): Secondary | ICD-10-CM | POA: Diagnosis not present

## 2022-04-15 DIAGNOSIS — R41841 Cognitive communication deficit: Secondary | ICD-10-CM | POA: Diagnosis not present

## 2022-04-15 DIAGNOSIS — N3942 Incontinence without sensory awareness: Secondary | ICD-10-CM

## 2022-04-15 NOTE — Assessment & Plan Note (Signed)
chronic, acute blood loss after surgery, baseline Hgb 9s,  on Pantoprazole for GI protection, FOBT negative, Iron 112 08/26/21, Vit B12 480, EPO 9.9 09/15/21, On Fe, Hgb 7.9 12/04/21

## 2022-04-15 NOTE — Assessment & Plan Note (Signed)
chronic, on Furosemide, 07/2021 venous US LLE negative DVT

## 2022-04-15 NOTE — Assessment & Plan Note (Signed)
Anterior medial left lower leg open areas, redness, warmth, swelling, treated with Doxy, healed.

## 2022-04-15 NOTE — Assessment & Plan Note (Signed)
blood pressure is controlled, on Amlodipine 5mg  qd started 07/07/21 by Nephrology.

## 2022-04-15 NOTE — Assessment & Plan Note (Signed)
off Alendronate due to CKD, 11/05/20 DEXA t score -2.8

## 2022-04-15 NOTE — Assessment & Plan Note (Signed)
Bun/creat  26/1.38 12/03/21, saw nephrology

## 2022-04-15 NOTE — Assessment & Plan Note (Signed)
Blisters small 2x left medial thigh above the left knee, 1x below the left knee, treated with Prednisone, resolved

## 2022-04-15 NOTE — Assessment & Plan Note (Signed)
Her urinary frequency/leakage, uses pads, it seems better since taking Myrbetriq in pm per Urology

## 2022-04-15 NOTE — Assessment & Plan Note (Signed)
Hx of R ankle fx, R hip total hip arthroplasty 11/30/21, ambulates with walker.

## 2022-04-15 NOTE — Progress Notes (Signed)
Location:   Clinic [facility]   Place of Service:  Clinic (12) Provider: Marlana Latus NP  Code Status: DNR Goals of Care: [place]    01/20/2022   10:52 AM  Advanced Directives  Does Patient Have a Medical Advance Directive? Yes  Type of Advance Directive Out of facility DNR (pink MOST or yellow form);Healthcare Power of Attorney  Does patient want to make changes to medical advance directive? No - Patient declined  Copy of [place] in Chart? Yes - validated most recent copy scanned in chart (See row information)  Pre-existing out of facility DNR order (yellow form or pink MOST form) Pink MOST form placed in chart (order not valid for inpatient use);Yellow form placed in chart (order not valid for inpatient use)     Chief Complaint  Patient presents with   Follow-up    Patient is here for a 2 week F/U for leg Blisters    HPI: Patient is a 88 y.o. female seen today for medical management of chronic diseases.      Blisters small 2x left medial thigh above the left knee, 1x below the left knee, treated with Prednisone, resolved. Anterior medial left lower leg open areas, redness, warmth, swelling, treated with Doxy, healed.  Anemia, chronic, acute blood loss after surgery, baseline Hgb 9s,  on Pantoprazole for GI protection, FOBT negative, Iron 112 08/26/21, Vit B12 480, EPO 9.9 09/15/21, On Fe, Hgb 7.9 12/04/21 Edema BLE, chronic, on Furosemide, 07/2021 venous US LLE negative DVT CKD Bun/creat  26/1.38 12/03/21, saw nephrology OA, Hx of R ankle fx, R hip total hip arthroplasty 11/30/21, ambulates with walker.              Her urinary frequency/leakage, uses pads, it seems better since taking Myrbetriq in pm per Urology             HTN, blood pressure is controlled, on Amlodipine 5mg  qd started 07/07/21 by Nephrology.              No constipation, taking Colace bid, prn MiraLax              Osteoporosis, off Alendronate due to CKD, 11/05/20 DEXA t score -2.8             Fecal  incontinence prn Imodium   Past Medical History:  Diagnosis Date   Anemia    Chronic kidney disease    stage 3   GERD (gastroesophageal reflux disease)    Hypertension     Past Surgical History:  Procedure Laterality Date   ORIF ANKLE FRACTURE Right 01/13/2021   Procedure: OPEN TREATMENT OF RIGHT TRIMALLEOLAR ANKLE FRACTURE WITHOUT POSTERIOR FIXATION, POSSIBLE SYNDESMOSIS;  Surgeon: Erle Crocker, MD;  Location: [facility];  Service: Orthopedics;  Laterality: Right;   TOTAL HIP ARTHROPLASTY Right 12/02/2021   Procedure: TOTAL HIP ARTHROPLASTY;  Surgeon: Willaim Sheng, MD;  Location: [facility];  Service: Orthopedics;  Laterality: Right;    Allergies  Allergen Reactions   Ace Inhibitors Other (See Comments)    Reaction unknown   Flagyl [Metronidazole] Other (See Comments)    Reaction unknown   Penicillins Swelling and Other (See Comments)    Facial swelling; can tolerate cephalosporins     Allergies as of 04/15/2022       Reactions   Ace Inhibitors Other (See Comments)   Reaction unknown   Flagyl [metronidazole] Other (See Comments)   Reaction unknown   Penicillins Swelling, Other (See Comments)  Facial swelling; can tolerate cephalosporins        Medication List        Accurate as of April 15, 2022  2:17 PM. If you have any questions, ask your nurse or doctor.          acetaminophen 325 MG tablet Commonly known as: TYLENOL Take 2 tablets (650 mg total) by mouth every 6 (six) hours as needed for mild pain, fever or headache.   amLODipine 5 MG tablet Commonly known as: NORVASC Take 5 mg by mouth daily.   cholecalciferol 25 MCG (1000 UNIT) tablet Commonly known as: VITAMIN D3 Take 2 tablets (2,000 Units total) by mouth daily.   ferrous sulfate 325 (65 FE) MG tablet Take 325 mg by mouth daily with breakfast.   furosemide 20 MG tablet Commonly known as: LASIX Take 1 tablet (20 mg total) by mouth daily.   ICAPS AREDS 2 PO Take 1 capsule by mouth  daily with breakfast.   Myrbetriq 25 MG Tb24 tablet Generic drug: mirabegron ER TAKE 2 TABLETS BY MOUTH ONCE  DAILY   pantoprazole 40 MG tablet Commonly known as: PROTONIX Take 1 tablet (40 mg total) by mouth daily.   predniSONE 10 MG tablet Commonly known as: DELTASONE Take 0.5 tablets (5 mg total) by mouth daily with breakfast for 14 days.   vitamin C 250 MG tablet Commonly known as: ASCORBIC ACID Take 1 tablet (250 mg total) by mouth daily.        Review of Systems:  Review of Systems  Constitutional:  Negative for fatigue, fever and unexpected weight change.  HENT:  Positive for hearing loss. Negative for congestion and voice change.   Eyes:  Negative for visual disturbance.  Respiratory:  Negative for shortness of breath.   Cardiovascular:  Positive for leg swelling.  Gastrointestinal:  Negative for abdominal pain and constipation.       Fecal incontinent.   Genitourinary:  Positive for frequency. Negative for dysuria and urgency.       Occasionally incontinent of urine. Urination average every 4 hrs during day 1x/night.   Musculoskeletal:  Positive for arthralgias and gait problem.       Right lower leg/ankle brace  Skin:  Negative for rash.  Neurological:  Negative for speech difficulty, weakness and light-headedness.       Memory lapses.   Psychiatric/Behavioral:  Negative for behavioral problems and sleep disturbance. The patient is not nervous/anxious.     Health Maintenance  Topic Date Due   COVID-19 Vaccine (4 - Mixed Product risk series) 04/28/2021   INFLUENZA VACCINE  01/12/2022   Zoster Vaccines- Shingrix (1 of 2) 06/14/2022 (Originally 05/04/1948)   Medicare Annual Wellness (AWV)  08/14/2022   TETANUS/TDAP  11/26/2029   Pneumonia Vaccine 53+ Years old  Completed   DEXA SCAN  Completed   HPV VACCINES  Aged Out    Physical Exam: Vitals:   04/15/22 1358  BP: (!) 147/82  Pulse: 78  Resp: 18  Temp: (!) 97.3 F (36.3 C)  SpO2: 95%  Weight: 119  lb (54 kg)  Height: 5\' 5"  (1.651 m)   Body mass index is 19.8 kg/m. Physical Exam Vitals reviewed.  Constitutional:      Appearance: Normal appearance.  HENT:     Head: Normocephalic and atraumatic.     Nose: Nose normal.     Mouth/Throat:     Mouth: Mucous membranes are moist.  Eyes:     Extraocular Movements: Extraocular movements intact.  Conjunctiva/sclera: Conjunctivae normal.     Pupils: Pupils are equal, round, and reactive to light.  Cardiovascular:     Rate and Rhythm: Normal rate and regular rhythm.     Heart sounds: No murmur heard. Pulmonary:     Breath sounds: No rales.  Abdominal:     General: Bowel sounds are normal.     Palpations: Abdomen is soft.     Tenderness: There is no abdominal tenderness.  Musculoskeletal:        General: No tenderness.     Cervical back: Normal range of motion and neck supple.     Right lower leg: Edema present.     Left lower leg: Edema present.     Comments: R ankle s/p ORIF 01/13/21. Edema BLE trace. THR 11/2021  Skin:    General: Skin is warm and dry.     Comments: Chronic pigmented venous insufficiency skin changes BLE. Healed blisters small 2x left medial thigh above the left knee, 1x below the left knee  Neurological:     Mental Status: She is alert. Mental status is at baseline.     Gait: Gait abnormal.     Comments: Oriented to person, place.   Psychiatric:        Mood and Affect: Mood normal.        Behavior: Behavior normal.        Thought Content: Thought content normal.     Labs reviewed: Basic Metabolic Panel: Recent Labs    12/01/21 0449 12/02/21 0353 12/03/21 0352  NA 137 135 135  K 3.6 3.4* 3.9  CL 103 100 103  CO2 24 24 25   GLUCOSE 103* 165* 126*  BUN 26* 33* 28*  CREATININE 1.48* 1.48* 1.38*  CALCIUM 9.5 9.7 9.0  MG  --  2.1 1.8   Liver Function Tests: Recent Labs    05/12/21 0715  AST 19  ALT 12  BILITOT 0.4  PROT 6.5   No results for input(s): "LIPASE", "AMYLASE" in the last 8760  hours. No results for input(s): "AMMONIA" in the last 8760 hours. CBC: Recent Labs    05/12/21 0715 09/15/21 0725 11/30/21 1149 12/01/21 0449 12/02/21 0353 12/03/21 0719 12/04/21 0744  WBC 5.9 5.6 7.0   < > 14.3* 12.4* 11.1*  NEUTROABS 3,292 2,929 5.0  --   --   --   --   HGB 9.3* 9.4* 10.5*   < > 10.9* 8.7* 7.9*  HCT 28.2* 29.2* 32.7*   < > 33.5* 26.5* 24.3*  MCV 95.6 95.4 98.8   < > 98.0 97.8 98.8  PLT 246 230 231   < > 237 172 167   < > = values in this interval not displayed.   Lipid Panel: No results for input(s): "CHOL", "HDL", "LDLCALC", "TRIG", "CHOLHDL", "LDLDIRECT" in the last 8760 hours. No results found for: "HGBA1C"  Procedures since last visit: No results found.  Assessment/Plan  Anemia chronic, acute blood loss after surgery, baseline Hgb 9s,  on Pantoprazole for GI protection, FOBT negative, Iron 112 08/26/21, Vit B12 480, EPO 9.9 09/15/21, On Fe, Hgb 7.9 12/04/21  Venous insufficiency (chronic) (peripheral) chronic, on Furosemide, 07/2021 venous US LLE negative DVT  CKD (chronic kidney disease) stage 4, GFR 15-29 ml/min (HCC) Bun/creat  26/1.38 12/03/21, saw nephrology  Osteoarthritis, multiple sites Hx of R ankle fx, R hip total hip arthroplasty 11/30/21, ambulates with walker.   Incontinent of urine Her urinary frequency/leakage, uses pads, it seems better since taking Myrbetriq in pm  per Urology  Hypertension blood pressure is controlled, on Amlodipine 5mg  qd started 07/07/21 by Nephrology.   Slow transit constipation taking Colace bid, prn MiraLax  Osteoporosis  off Alendronate due to CKD, 11/05/20 DEXA t score -2.8  Blisters of multiple sites Blisters small 2x left medial thigh above the left knee, 1x below the left knee, treated with Prednisone, resolved  Cellulitis of left anterior lower leg Anterior medial left lower leg open areas, redness, warmth, swelling, treated with Doxy, healed.    Labs/tests ordered:  none  Next appt:  2 months

## 2022-04-15 NOTE — Assessment & Plan Note (Signed)
taking Colace bid, prn MiraLax

## 2022-04-19 ENCOUNTER — Encounter: Payer: Self-pay | Admitting: Nurse Practitioner

## 2022-04-20 DIAGNOSIS — R41841 Cognitive communication deficit: Secondary | ICD-10-CM | POA: Diagnosis not present

## 2022-04-20 DIAGNOSIS — R1312 Dysphagia, oropharyngeal phase: Secondary | ICD-10-CM | POA: Diagnosis not present

## 2022-04-22 ENCOUNTER — Encounter: Payer: Medicare Other | Admitting: Nurse Practitioner

## 2022-04-22 DIAGNOSIS — R1312 Dysphagia, oropharyngeal phase: Secondary | ICD-10-CM | POA: Diagnosis not present

## 2022-04-22 DIAGNOSIS — R41841 Cognitive communication deficit: Secondary | ICD-10-CM | POA: Diagnosis not present

## 2022-04-27 DIAGNOSIS — Z85810 Personal history of malignant neoplasm of tongue: Secondary | ICD-10-CM | POA: Diagnosis not present

## 2022-04-27 DIAGNOSIS — L570 Actinic keratosis: Secondary | ICD-10-CM | POA: Diagnosis not present

## 2022-04-27 DIAGNOSIS — R21 Rash and other nonspecific skin eruption: Secondary | ICD-10-CM | POA: Diagnosis not present

## 2022-04-27 DIAGNOSIS — L858 Other specified epidermal thickening: Secondary | ICD-10-CM | POA: Diagnosis not present

## 2022-04-28 DIAGNOSIS — R41841 Cognitive communication deficit: Secondary | ICD-10-CM | POA: Diagnosis not present

## 2022-04-28 DIAGNOSIS — R1312 Dysphagia, oropharyngeal phase: Secondary | ICD-10-CM | POA: Diagnosis not present

## 2022-04-30 ENCOUNTER — Telehealth: Payer: Self-pay

## 2022-04-30 ENCOUNTER — Other Ambulatory Visit: Payer: Self-pay | Admitting: Nurse Practitioner

## 2022-04-30 DIAGNOSIS — R238 Other skin changes: Secondary | ICD-10-CM

## 2022-04-30 DIAGNOSIS — R1312 Dysphagia, oropharyngeal phase: Secondary | ICD-10-CM | POA: Diagnosis not present

## 2022-04-30 DIAGNOSIS — R41841 Cognitive communication deficit: Secondary | ICD-10-CM | POA: Diagnosis not present

## 2022-04-30 MED ORDER — PREDNISONE 10 MG PO TABS: 10.0000 mg | ORAL_TABLET | Freq: Every day | ORAL | 3 refills | Status: DC

## 2022-04-30 NOTE — Telephone Encounter (Signed)
Patient's daughter Chrys Racer) called requesting a refill on Prednisone 10 mg due to blisters returning on pt's body. The daughter would like to know if it could a continuous medication to help with the patients blisters since it helps to clean them up. Please advise.

## 2022-04-30 NOTE — Telephone Encounter (Signed)
Called patient and left a detailed message that medication was send into pharmacy.

## 2022-05-03 DIAGNOSIS — D631 Anemia in chronic kidney disease: Secondary | ICD-10-CM | POA: Diagnosis not present

## 2022-05-03 DIAGNOSIS — R809 Proteinuria, unspecified: Secondary | ICD-10-CM | POA: Diagnosis not present

## 2022-05-03 DIAGNOSIS — N1832 Chronic kidney disease, stage 3b: Secondary | ICD-10-CM | POA: Diagnosis not present

## 2022-05-03 DIAGNOSIS — I129 Hypertensive chronic kidney disease with stage 1 through stage 4 chronic kidney disease, or unspecified chronic kidney disease: Secondary | ICD-10-CM | POA: Diagnosis not present

## 2022-05-03 DIAGNOSIS — N189 Chronic kidney disease, unspecified: Secondary | ICD-10-CM | POA: Diagnosis not present

## 2022-05-03 LAB — IRON,TIBC AND FERRITIN PANEL
%SAT: 28
Iron: 76
TIBC: 275
UIBC: 199

## 2022-05-03 LAB — BASIC METABOLIC PANEL
BUN: 38 — AB (ref 4–21)
CO2: 25 — AB (ref 13–22)
Chloride: 102 (ref 99–108)
Creatinine: 1.5 — AB (ref 0.5–1.1)
Glucose: 98
Potassium: 4 mEq/L (ref 3.5–5.1)
Sodium: 138 (ref 137–147)

## 2022-05-03 LAB — VITAMIN D 25 HYDROXY (VIT D DEFICIENCY, FRACTURES): Vit D, 25-Hydroxy: 81

## 2022-05-03 LAB — COMPREHENSIVE METABOLIC PANEL
Albumin: 4.5 (ref 3.5–5.0)
Calcium: 10.2 (ref 8.7–10.7)
eGFR: 33

## 2022-05-03 LAB — CBC AND DIFFERENTIAL: Hemoglobin: 10 — AB (ref 12.0–16.0)

## 2022-05-04 DIAGNOSIS — R41841 Cognitive communication deficit: Secondary | ICD-10-CM | POA: Diagnosis not present

## 2022-05-04 DIAGNOSIS — R1312 Dysphagia, oropharyngeal phase: Secondary | ICD-10-CM | POA: Diagnosis not present

## 2022-05-11 ENCOUNTER — Telehealth: Payer: Medicare Other

## 2022-05-11 NOTE — Telephone Encounter (Signed)
Called patients daughter([name]) and no answer left detailed message per Man Darlina Rumpf Mast,NP to use only when have blisters.  Mast, Man X, NP  You4 minutes ago (4:53 PM)    Only when she has blisters, thanks

## 2022-05-11 NOTE — Telephone Encounter (Signed)
Patient's daughter Chrys Racer) called and would like to know if the patient suppose to continue to take the prednisone daily or only when she have a breakout. She states that it was 3 additional refills and didn't know if she should get them or wait for a outbreak.

## 2022-05-12 DIAGNOSIS — R1312 Dysphagia, oropharyngeal phase: Secondary | ICD-10-CM | POA: Diagnosis not present

## 2022-05-12 DIAGNOSIS — R41841 Cognitive communication deficit: Secondary | ICD-10-CM | POA: Diagnosis not present

## 2022-05-25 DIAGNOSIS — I872 Venous insufficiency (chronic) (peripheral): Secondary | ICD-10-CM | POA: Diagnosis not present

## 2022-05-25 DIAGNOSIS — D692 Other nonthrombocytopenic purpura: Secondary | ICD-10-CM | POA: Diagnosis not present

## 2022-05-25 DIAGNOSIS — L814 Other melanin hyperpigmentation: Secondary | ICD-10-CM | POA: Diagnosis not present

## 2022-05-25 DIAGNOSIS — L570 Actinic keratosis: Secondary | ICD-10-CM | POA: Diagnosis not present

## 2022-05-25 DIAGNOSIS — L821 Other seborrheic keratosis: Secondary | ICD-10-CM | POA: Diagnosis not present

## 2022-05-25 DIAGNOSIS — R21 Rash and other nonspecific skin eruption: Secondary | ICD-10-CM | POA: Diagnosis not present

## 2022-05-25 DIAGNOSIS — Z85810 Personal history of malignant neoplasm of tongue: Secondary | ICD-10-CM | POA: Diagnosis not present

## 2022-06-16 ENCOUNTER — Encounter: Payer: Self-pay | Admitting: Nurse Practitioner

## 2022-06-17 ENCOUNTER — Encounter: Payer: Self-pay | Admitting: Nurse Practitioner

## 2022-06-17 ENCOUNTER — Non-Acute Institutional Stay: Payer: Medicare Other | Admitting: Nurse Practitioner

## 2022-06-17 VITALS — BP 124/80 | HR 75 | Temp 97.8°F | Ht 65.0 in | Wt 120.0 lb

## 2022-06-17 DIAGNOSIS — I872 Venous insufficiency (chronic) (peripheral): Secondary | ICD-10-CM

## 2022-06-17 DIAGNOSIS — R238 Other skin changes: Secondary | ICD-10-CM

## 2022-06-17 DIAGNOSIS — N184 Chronic kidney disease, stage 4 (severe): Secondary | ICD-10-CM | POA: Diagnosis not present

## 2022-06-17 DIAGNOSIS — N3942 Incontinence without sensory awareness: Secondary | ICD-10-CM

## 2022-06-17 DIAGNOSIS — D649 Anemia, unspecified: Secondary | ICD-10-CM | POA: Diagnosis not present

## 2022-06-17 DIAGNOSIS — I10 Essential (primary) hypertension: Secondary | ICD-10-CM

## 2022-06-17 DIAGNOSIS — K5901 Slow transit constipation: Secondary | ICD-10-CM

## 2022-06-17 DIAGNOSIS — M8000XD Age-related osteoporosis with current pathological fracture, unspecified site, subsequent encounter for fracture with routine healing: Secondary | ICD-10-CM

## 2022-06-17 DIAGNOSIS — M159 Polyosteoarthritis, unspecified: Secondary | ICD-10-CM

## 2022-06-17 DIAGNOSIS — Z66 Do not resuscitate: Secondary | ICD-10-CM | POA: Diagnosis not present

## 2022-06-17 NOTE — Progress Notes (Signed)
Location:   clinic [place]   Place of Service:  Clinic (12) Provider: Marlana Latus NP  Code Status: DNR Goals of Care:     06/17/2022    8:08 AM  Advanced Directives  Does Patient Have a Medical Advance Directive? Yes  Type of [employer] [place];Out of facility DNR (pink MOST or yellow form)  Does patient want to make changes to medical advance directive? No - Patient declined  Copy of [place] in Chart? Yes - validated most recent copy scanned in chart (See row information)  Pre-existing out of facility DNR order (yellow form or pink MOST form) Yellow form placed in chart (order not valid for inpatient use);Pink MOST form placed in chart (order not valid for inpatient use)     Chief Complaint  Patient presents with   Medical Management of Chronic Issues    2 month follow-up. Discuss need for flu vaccine, shingrix, AWV and additional covid boosters or post pone if patient refuses or is not a candidate. [facility] verified. Patient needs to discuss prednison. Here with daughter Chrys Racer    HPI: Patient is a 89 y.o. female seen today for medical management of chronic diseases.     Hx of blister eruptions, responded to Prednisone.  Anemia, chronic, on Pantoprazole for GI protection, FOBT negative, Vit B12 480, EPO 9.9 09/15/21, On Fe, Hgb 10.0, Iron 76 05/03/22 Edema BLE, chronic, on Furosemide, 07/2021 venous US LLE negative DVT CKD Bun/creat  38/1.5 05/03/22, saw nephrology OA, Hx of R ankle fx, R hip total hip arthroplasty 11/30/21, ambulates with walker.              Chronic urinary frequency/leakage, uses pads, it seems better since taking Myrbetriq in pm per Urology             HTN, blood  pressure is controlled, on Amlodipine 5mg  qd started 07/07/21 by Nephrology.              No constipation, taking Colace bid, prn MiraLax              Osteoporosis, off Alendronate due to CKD, 11/05/20 DEXA t score -2.8, Vit D 81 05/03/22             Fecal incontinence prn Imodium  Past Medical History:  Diagnosis Date   Anemia    Chronic kidney disease    stage 3   GERD (gastroesophageal reflux disease)    Hypertension     Past Surgical History:  Procedure Laterality Date   ORIF ANKLE FRACTURE Right 01/13/2021   Procedure: OPEN TREATMENT OF RIGHT TRIMALLEOLAR ANKLE FRACTURE WITHOUT POSTERIOR FIXATION, POSSIBLE SYNDESMOSIS;  Surgeon: Erle Crocker, MD;  Location: [facility];  Service:  Orthopedics;  Laterality: Right;   TOTAL HIP ARTHROPLASTY Right 12/02/2021   Procedure: TOTAL HIP ARTHROPLASTY;  Surgeon: Willaim Sheng, MD;  Location: [facility];  Service: Orthopedics;  Laterality: Right;    Allergies  Allergen Reactions   Ace Inhibitors Other (See Comments)    Reaction unknown   Flagyl [Metronidazole] Other (See Comments)    Reaction unknown   Penicillins Swelling and Other (See Comments)    Facial swelling; can tolerate cephalosporins     Allergies as of 06/17/2022       Reactions   Ace Inhibitors Other (See Comments)   Reaction unknown   Flagyl [metronidazole] Other (See Comments)   Reaction unknown   Penicillins Swelling, Other (See Comments)   Facial swelling; can tolerate cephalosporins        Medication List        Accurate as of June 17, 2022  4:47 PM. If you have any questions, ask your nurse or doctor.          acetaminophen 325 MG tablet Commonly known as: TYLENOL Take 2 tablets (650 mg total) by mouth every 6 (six) hours as needed for mild pain, fever or headache.   amLODipine 5 MG tablet Commonly known as: NORVASC Take 5 mg by mouth daily.   cholecalciferol 25 MCG (1000 UNIT) tablet Commonly known as: VITAMIN D3 Take 2 tablets (2,000 Units  total) by mouth daily.   ferrous sulfate 325 (65 FE) MG tablet Take 325 mg by mouth daily with breakfast.   furosemide 20 MG tablet Commonly known as: LASIX Take 1 tablet (20 mg total) by mouth daily.   ICAPS AREDS 2 PO Take 1 capsule by mouth daily with breakfast.   Myrbetriq 25 MG Tb24 tablet Generic drug: mirabegron ER TAKE 2 TABLETS BY MOUTH ONCE  DAILY   pantoprazole 40 MG tablet Commonly known as: PROTONIX Take 1 tablet (40 mg total) by mouth daily.   predniSONE 10 MG tablet Commonly known as: DELTASONE Take 1 tablet (10 mg total) by mouth daily with breakfast. For 10 days for skin blisters eruption.   vitamin C 250 MG tablet Commonly known as: ASCORBIC ACID Take 1 tablet (250 mg total) by mouth daily.        Review of Systems:  Review of Systems  Constitutional:  Negative for fatigue, fever and unexpected weight change.  HENT:  Positive for hearing loss. Negative for congestion and voice change.   Eyes:  Negative for visual disturbance.  Respiratory:  Negative for shortness of breath.   Cardiovascular:  Positive for leg swelling.  Gastrointestinal:  Negative for abdominal pain and constipation.       Fecal incontinent.   Genitourinary:  Positive for frequency. Negative for dysuria and urgency.       Occasionally incontinent of urine. Urination average every 4 hrs during day 1x/night.   Musculoskeletal:  Positive for arthralgias and gait problem.       Right lower leg/ankle brace  Skin:  Negative for color change.  Neurological:  Negative for speech difficulty, weakness and light-headedness.       Memory lapses.   Psychiatric/Behavioral:  Negative for behavioral problems and sleep disturbance. The patient is not nervous/anxious.     Health Maintenance  Topic Date Due   COVID-19 Vaccine (6 - 2023-24 season) 02/12/2022   Medicare Annual Wellness (AWV)  08/14/2022   Zoster Vaccines- Shingrix (1 of 2) 11/18/2022 (Originally 05/04/1948)   DTaP/Tdap/Td (2 - Td  or Tdap) 11/26/2029   Pneumonia  Vaccine 54+ Years old  Completed   INFLUENZA VACCINE  Completed   DEXA SCAN  Completed   HPV VACCINES  Aged Out    Physical Exam: Vitals:   06/16/22 1619  BP: 124/80  Pulse: 75  Temp: 97.8 F (36.6 C)  TempSrc: Temporal  SpO2: 97%  Weight: 120 lb (54.4 kg)  Height: 5\' 5"  (1.651 m)   Body mass index is 19.97 kg/m. Physical Exam Vitals reviewed.  Constitutional:      Appearance: Normal appearance.  HENT:     Head: Normocephalic and atraumatic.     Nose: Nose normal.     Mouth/Throat:     Mouth: Mucous membranes are moist.  Eyes:     Extraocular Movements: Extraocular movements intact.     Conjunctiva/sclera: Conjunctivae normal.     Pupils: Pupils are equal, round, and reactive to light.  Cardiovascular:     Rate and Rhythm: Normal rate and regular rhythm.     Heart sounds: No murmur heard. Pulmonary:     Breath sounds: No rales.  Abdominal:     General: Bowel sounds are normal.     Palpations: Abdomen is soft.     Tenderness: There is no abdominal tenderness.  Musculoskeletal:        General: No tenderness.     Cervical back: Normal range of motion and neck supple.     Right lower leg: Edema present.     Left lower leg: Edema present.     Comments: R ankle s/p ORIF 01/13/21. Edema BLE trace. THR 11/2021  Skin:    General: Skin is warm and dry.     Comments: Chronic pigmented venous insufficiency skin changes BLE. Healed blisters small 2x left medial thigh above the left knee, 1x below the left knee  Neurological:     Mental Status: She is alert. Mental status is at baseline.     Gait: Gait abnormal.     Comments: Oriented to person, place.   Psychiatric:        Mood and Affect: Mood normal.        Behavior: Behavior normal.        Thought Content: Thought content normal.     Labs reviewed: Basic Metabolic Panel: Recent Labs    12/01/21 0449 12/02/21 0353 12/03/21 0352 05/03/22 0000  NA 137 135 135 138  K 3.6 3.4* 3.9  4.0  CL 103 100 103 102  CO2 24 24 25  25*  GLUCOSE 103* 165* 126*  --   BUN 26* 33* 28* 38*  CREATININE 1.48* 1.48* 1.38* 1.5*  CALCIUM 9.5 9.7 9.0 10.2  MG  --  2.1 1.8  --    Liver Function Tests: Recent Labs    05/03/22 0000  ALBUMIN 4.5   No results for input(s): "LIPASE", "AMYLASE" in the last 8760 hours. No results for input(s): "AMMONIA" in the last 8760 hours. CBC: Recent Labs    09/15/21 0725 11/30/21 1149 12/01/21 0449 12/02/21 0353 12/03/21 0719 12/04/21 0744 05/03/22 0000  WBC 5.6 7.0   < > 14.3* 12.4* 11.1*  --   NEUTROABS 2,929 5.0  --   --   --   --   --   HGB 9.4* 10.5*   < > 10.9* 8.7* 7.9* 10.0*  HCT 29.2* 32.7*   < > 33.5* 26.5* 24.3*  --   MCV 95.4 98.8   < > 98.0 97.8 98.8  --   PLT 230 231   < > 237 172  167  --    < > = values in this interval not displayed.   Lipid Panel: No results for input(s): "CHOL", "HDL", "LDLCALC", "TRIG", "CHOLHDL", "LDLDIRECT" in the last 8760 hours. No results found for: "HGBA1C"  Procedures since last visit: No results found.  Assessment/Plan  Venous insufficiency (chronic) (peripheral) Chronic edema BLE,  on Furosemide, 07/2021 venous US LLE negative DVT  CKD (chronic kidney disease) stage 4, GFR 15-29 ml/min ([place])  Bun/creat  38/1.5 05/03/22, saw nephrology  Osteoarthritis, multiple sites Hx of R ankle fx, R hip total hip arthroplasty 11/30/21, ambulates with walker.   Blisters of multiple sites Hx of, responded to Prednisone   Incontinent of urine  Chronic urinary frequency/leakage, uses pads, it seems better since taking Myrbetriq in pm per Urology  Hypertension blood pressure is controlled, on Amlodipine 5mg  qd started 07/07/21 by Nephrology.  Slow transit constipation No constipation, taking Colace bid, prn MiraLax  Osteoporosis  off Alendronate due to CKD, 11/05/20 DEXA t score -2.8, Vit D 81 05/03/22  Anemia chronic, on Pantoprazole for GI protection, FOBT negative, Vit B12 480, EPO 9.9 09/15/21,  On Fe, Hgb 10.0, Iron 76 05/03/22   Labs/tests ordered:  none  Next appt: 3 months. AWV 2 weeks.

## 2022-06-17 NOTE — Assessment & Plan Note (Signed)
Hx of R ankle fx, R hip total hip arthroplasty 11/30/21, ambulates with walker.

## 2022-06-17 NOTE — Assessment & Plan Note (Signed)
Chronic edema BLE,  on Furosemide, 07/2021 venous US LLE negative DVT

## 2022-06-17 NOTE — Assessment & Plan Note (Signed)
chronic, on Pantoprazole for GI protection, FOBT negative, Vit B12 480, EPO 9.9 09/15/21, On Fe, Hgb 10.0, Iron 76 05/03/22

## 2022-06-17 NOTE — Assessment & Plan Note (Signed)
off Alendronate due to CKD, 11/05/20 DEXA t score -2.8, Vit D 81 05/03/22

## 2022-06-17 NOTE — Assessment & Plan Note (Signed)
blood pressure is controlled, on Amlodipine 5mg  qd started 07/07/21 by Nephrology.

## 2022-06-17 NOTE — Assessment & Plan Note (Signed)
Bun/creat  38/1.5 05/03/22, saw nephrology

## 2022-06-17 NOTE — Assessment & Plan Note (Signed)
No constipation, taking Colace bid, prn MiraLax

## 2022-06-17 NOTE — Assessment & Plan Note (Signed)
Chronic urinary frequency/leakage, uses pads, it seems better since taking Myrbetriq in pm per Urology

## 2022-06-17 NOTE — Assessment & Plan Note (Signed)
Hx of, responded to Prednisone

## 2022-06-28 DIAGNOSIS — Z23 Encounter for immunization: Secondary | ICD-10-CM | POA: Diagnosis not present

## 2022-07-01 ENCOUNTER — Encounter: Payer: Self-pay | Admitting: Nurse Practitioner

## 2022-07-01 ENCOUNTER — Ambulatory Visit: Payer: Medicare Other | Admitting: Nurse Practitioner

## 2022-07-01 VITALS — BP 140/82 | HR 89 | Temp 97.8°F | Resp 20 | Ht 65.0 in | Wt 121.0 lb

## 2022-07-01 DIAGNOSIS — Z0000 Encounter for general adult medical examination without abnormal findings: Secondary | ICD-10-CM | POA: Diagnosis not present

## 2022-07-01 DIAGNOSIS — M8000XD Age-related osteoporosis with current pathological fracture, unspecified site, subsequent encounter for fracture with routine healing: Secondary | ICD-10-CM

## 2022-07-01 NOTE — Progress Notes (Signed)
Subjective:   Peggy [name] is a 89 y.o. female who presents for Medicare Annual (Subsequent) preventive examination @ clinic [facility] [address].  Cardiac Risk Factors include: advanced age (>42men, >61 women);dyslipidemia     Objective:    Today's Vitals   07/01/22 1427  BP: (!) 140/82  Pulse: 89  Resp: 20  Temp: 97.8 F (36.6 C)  SpO2: 96%  Weight: 121 lb (54.9 kg)  Height: 5\' 5"  (1.651 m)   Body mass index is 20.14 kg/m.     07/01/2022   11:09 AM 06/17/2022    8:08 AM 01/20/2022   10:52 AM 01/08/2022   11:57 AM 12/22/2021   10:13 AM 12/18/2021   10:47 AM 11/30/2021   11:40 AM  Advanced Directives  Does Patient Have a Medical Advance Directive? Yes Yes Yes Yes Yes Yes No  Type of 12/02/2021 of [place];Out of facility DNR (pink MOST or yellow form) Healthcare Power of [place];Out of facility DNR (pink MOST or yellow form) Out of facility DNR (pink MOST or yellow form);Healthcare Power of [place] of facility DNR (pink MOST or yellow form);Healthcare Power of [employer] of facility DNR (pink MOST or yellow form);Healthcare Power of [employer] of facility DNR (pink MOST or yellow form);Healthcare Power of Attorney   Does patient want to make changes to medical advance directive? No - Patient declined No - Patient declined No - Patient declined No - Patient declined No - Patient declined No - Patient declined   Copy of Healthcare Power of Attorney in Chart? Yes - validated most recent copy scanned in chart (See row information) Yes - validated most recent copy scanned in chart (See row information) Yes - validated most recent copy scanned in chart (See row information) Yes - validated most recent copy scanned in chart (See row information) Yes - validated most recent copy scanned in chart (See row information) Yes - validated most recent copy scanned in chart (See row information)   Would patient like information on creating a medical advance  directive?       No - Patient declined  Pre-existing out of facility DNR order (yellow form or pink MOST form) Yellow form placed in chart (order not valid for inpatient use);Pink MOST form placed in chart (order not valid for inpatient use) Yellow form placed in chart (order not valid for inpatient use);Pink MOST form placed in chart (order not valid for inpatient use) Pink MOST form placed in chart (order not valid for inpatient use);Yellow form placed in chart (order not valid for inpatient use) Pink MOST form placed in chart (order not valid for inpatient use);Yellow form placed in chart (order not valid for inpatient use) Pink MOST form placed in chart (order not valid for inpatient use);Yellow form placed in chart (order not valid for inpatient use) Pink MOST form placed in chart (order not valid for inpatient use);Yellow form placed in chart (order not valid for inpatient use)     Current Medications (verified) Outpatient Encounter Medications as of 07/01/2022  Medication Sig   acetaminophen (TYLENOL) 325 MG tablet Take 2 tablets (650 mg total) by mouth every 6 (six) hours as needed for mild pain, fever or headache.   amLODipine (NORVASC) 5 MG tablet Take 5 mg by mouth daily.   cholecalciferol (VITAMIN D) 25 MCG (1000 UNIT) tablet Take 2 tablets (2,000 Units total) by mouth daily.   ferrous sulfate 325 (65 FE) MG tablet Take 325 mg by mouth daily with  breakfast.   furosemide (LASIX) 20 MG tablet Take 1 tablet (20 mg total) by mouth daily.   Multiple Vitamins-Minerals (ICAPS AREDS 2 PO) Take 1 capsule by mouth daily with breakfast.   MYRBETRIQ 25 MG TB24 tablet TAKE 2 TABLETS BY MOUTH ONCE  DAILY   pantoprazole (PROTONIX) 40 MG tablet Take 1 tablet (40 mg total) by mouth daily.   vitamin C (ASCORBIC ACID) 250 MG tablet Take 1 tablet (250 mg total) by mouth daily.   [DISCONTINUED] predniSONE (DELTASONE) 10 MG tablet Take 1 tablet (10 mg total) by mouth daily with breakfast. For 10 days for skin  blisters eruption. (Patient not taking: Reported on 06/17/2022)   No facility-administered encounter medications on file as of 07/01/2022.    Allergies (verified) Ace inhibitors, Flagyl [metronidazole], and Penicillins   History: Past Medical History:  Diagnosis Date   Anemia    Chronic kidney disease    stage 3   GERD (gastroesophageal reflux disease)    Hypertension    Past Surgical History:  Procedure Laterality Date   ORIF ANKLE FRACTURE Right 01/13/2021   Procedure: OPEN TREATMENT OF RIGHT TRIMALLEOLAR ANKLE FRACTURE WITHOUT POSTERIOR FIXATION, POSSIBLE SYNDESMOSIS;  Surgeon: Terance Hart, MD;  Location: [facility] OR;  Service: Orthopedics;  Laterality: Right;   TOTAL HIP ARTHROPLASTY Right 12/02/2021   Procedure: TOTAL HIP ARTHROPLASTY;  Surgeon: Joen Laura, MD;  Location: [facility] ORS;  Service: Orthopedics;  Laterality: Right;   Family History  Problem Relation Age of Onset   Heart failure Mother    Heart disease Father    Arthritis Sister    Social History   Socioeconomic History   Marital status: Widowed    Spouse name: Adela Lank   Number of children: 2   Years of education: Not on file   Highest education level: Not on file  Occupational History   Occupation: Runner, broadcasting/film/video    Comment: retired  Tobacco Use   Smoking status: Former    Years: 12.00    Types: Cigarettes    Quit date: 06/14/1957    Years since quitting: 65.0   Smokeless tobacco: Never  Vaping Use   Vaping Use: Never used  Substance and Sexual Activity   Alcohol use: Not Currently   Drug use: No   Sexual activity: Not on file  Other Topics Concern   Not on file  Social History Narrative   Social History     Socioeconomic History       Marital status: Married   09/01/1954       Spouse name: Adela Lank   Two people stay in the home on the fourth floor       Number of children: 2       Years of education:       Highest education level: Not on file    Exercise: yoga, walking     Social Needs        Financial resource strain: Not on file       Food insecurity - worry: Not on file       Food insecurity - inability: Not on file       Transportation needs - medical: Not on file       Transportation needs - non-medical: Not on file     Occupational History       Not on file     Tobacco Use       Smoking status: Never Smoker       Smokeless tobacco: Never Used  Substance and Sexual Activity       Alcohol use: No       Drug use: No       Sexual activity: Not on file     Other Topics       Concerns:         Not on file     Social History Narrative       Not on file   Social Determinants of Health   Financial Resource Strain: Low Risk  (10/20/2017)   Overall Financial Resource Strain (CARDIA)    Difficulty of Paying Living Expenses: Not hard at all  Food Insecurity: No Food Insecurity (10/20/2017)   Hunger Vital Sign    Worried About Running Out of Food in the Last Year: Never true    Ran Out of Food in the Last Year: Never true  Transportation Needs: No Transportation Needs (10/20/2017)   PRAPARE - Administrator, Civil Service (Medical): No    Lack of Transportation (Non-Medical): No  Physical Activity: Inactive (10/20/2017)   Exercise Vital Sign    Days of Exercise per Week: 0 days    Minutes of Exercise per Session: 0 min  Stress: No Stress Concern Present (10/20/2017)   [employer] of Occupational Health - Occupational Stress Questionnaire    Feeling of Stress : Only a little  Social Connections: Somewhat Isolated (10/20/2017)   Social Connection and Isolation Panel [NHANES]    Frequency of Communication with Friends and Family: More than three times a week    Frequency of Social Gatherings with Friends and Family: More than three times a week    Attends Religious Services: Never    Database administrator or Organizations: No    Attends Engineer, structural: Never    Marital Status: Married    Tobacco Counseling Counseling given: Not  Answered   Clinical Intake:  Pre-visit preparation completed: Yes  Pain : No/denies pain     BMI - recorded: 20.14 Nutritional Status: BMI of 19-24  Normal Nutritional Risks: None Diabetes: No  How often do you need to have someone help you when you read instructions, pamphlets, or other written materials from your doctor or pharmacy?: 3 - Sometimes What is the last grade level you completed in school?: college  Diabetic?no  Interpreter Needed?: No  Information entered by :: Vantasia Pinkney Nedra Hai NP   Activities of Daily Living    07/01/2022    2:44 PM 12/01/2021    7:55 PM  In your present state of health, do you have any difficulty performing the following activities:  Hearing? 0 0  Vision? 0 0  Comment glasses   Difficulty concentrating or making decisions? 0 0  Walking or climbing stairs? 1 1  Dressing or bathing? 0 0  Doing errands, shopping? 1 1  Preparing Food and eating ? N   Using the Toilet? N   In the past six months, have you accidently leaked urine? Y   Do you have problems with loss of bowel control? N   Managing your Medications? Y   Managing your Finances? N   Housekeeping or managing your Housekeeping? Y     Patient Care Team: Jimesha Rising X, NP as PCP - General (Internal Medicine)  Indicate any recent Medical Services you may have received from other than [facility] providers in the past year (date may be approximate).     Assessment:   This is a routine wellness examination for [place].  Hearing/Vision screen No results found.  Dietary issues and exercise activities discussed: Current Exercise Habits: The patient does not participate in regular exercise at present, Exercise limited by: orthopedic condition(s)   Goals Addressed             This Visit's Progress    Maintain Mobility and Function       Evidence-based guidance:  Acknowledge and validate impact of pain, loss of strength and potential disfigurement (hand osteoarthritis) on mental health  and daily life, such as social isolation, anxiety, depression, impaired sexual relationship and   injury from falls.  Anticipate referral to physical or occupational therapy for assessment, therapeutic exercise and recommendation for adaptive equipment or assistive devices; encourage participation.  Assess impact on ability to perform activities of daily living, as well as engage in sports and leisure events or requirements of work or school.  Provide anticipatory guidance and reassurance about the benefit of exercise to maintain function; acknowledge and normalize fear that exercise may worsen symptoms.  Encourage regular exercise, at least 10 minutes at a time for 45 minutes per week; consider yoga, water exercise and proprioceptive exercises; encourage use of wearable activity tracker to increase motivation and adherence.  Encourage maintenance or resumption of daily activities, including employment, as pain allows and with minimal exposure to trauma.  Assist patient to advocate for adaptations to the work environment.  Consider level of pain and function, gender, age, lifestyle, patient preference, quality of life, readiness and ?ocapacity to benefit? when recommending patients for orthopaedic surgery consultation.  Explore strategies, such as changes to medication regimen or activity that enables patient to anticipate and manage flare-ups that increase deconditioning and disability.  Explore patient preferences; encourage exposure to a broader range of activities that have been avoided for fear of experiencing pain.  Identify barriers to participation in therapy or exercise, such as pain with activity, anticipated or imagined pain.  Monitor postoperative joint replacement or any preexisting joint replacement for ongoing pain and loss of function; provide social support and encouragement throughout recovery.   Notes:        Depression Screen    06/17/2022    1:45 PM 08/13/2021    2:22 PM  06/18/2021    1:55 PM 04/16/2021    3:03 PM 10/20/2017    2:01 PM 08/10/2017    9:25 AM  PHQ 2/9 Scores  PHQ - 2 Score 0 0 0 0 0 0    Fall Risk    07/01/2022   11:07 AM 06/17/2022    1:45 PM 01/20/2022   10:52 AM 09/10/2021    2:02 PM 08/13/2021    2:22 PM  Fall Risk   Falls in the past year? 0 0 0 1 0  Number falls in past yr: 0 0 0 0 0  Injury with Fall? 0 0 0 0 0  Risk for fall due to : No Fall Risks No Fall Risks Other (Comment) History of fall(s) History of fall(s)  Follow up Falls evaluation completed Falls evaluation completed Falls evaluation completed Falls evaluation completed Falls evaluation completed;Education provided;Falls prevention discussed    FALL RISK PREVENTION PERTAINING TO THE HOME:  Any stairs in or around the home? Yes  If so, are there any without handrails? No  Home free of loose throw rugs in walkways, pet beds, electrical cords, etc? Yes  Adequate lighting in your home to reduce risk of falls? Yes   ASSISTIVE DEVICES UTILIZED TO PREVENT FALLS:  Life alert? No  Use of  a cane, walker or w/c? Yes  Grab bars in the bathroom? Yes  Shower chair or bench in shower? Yes  Elevated toilet seat or a handicapped toilet? Yes   TIMED UP AND GO:  Was the test performed? Yes .  Length of time to ambulate 10 feet: 12 sec.   Gait slow and steady with assistive device  Cognitive Function:    08/13/2021    2:46 PM 08/13/2021    2:34 PM 10/20/2017    2:48 PM  MMSE - Mini Mental State Exam  Orientation to time 2 2 4   Orientation to Place 5 5 5   Registration 3 3 3   Attention/ Calculation 5 5 5   Recall 2 2 2   Language- name 2 objects 2 2 2   Language- repeat 1 1 1   Language- follow 3 step command 3 3 3   Language- read & follow direction 1 1 1   Write a sentence 1 1 1   Copy design 1 1 1   Total score 26 26 28         Immunizations Immunization History  Administered Date(s) Administered   Fluad Quad(high Dose 65+) 03/28/2019   H1N1 03/18/2009   Influenza, High  Dose Seasonal PF 03/25/2017, 03/16/2018, 03/14/2021, 03/14/2022   Influenza,inj,quad, With Preservative 03/26/2020, 03/30/2021   Influenza-Unspecified 02/25/2011, 03/31/2012, 03/27/2013, 03/14/2014, 03/25/2016, 06/14/2017   Moderna SARS-COV2 Booster Vaccination 04/22/2020, 10/28/2020   Moderna Sars-Covid-2 Vaccination 06/18/2019, 07/16/2019   PFIZER(Purple Top)SARS-COV-2 Vaccination 03/03/2021   PPD Test 12/25/2020, 01/05/2021, 12/17/2021   Pfizer Covid-19 Vaccine Bivalent Booster 39yrs & up 03/03/2021   Pneumococcal Conjugate-13 09/10/2014   Pneumococcal Polysaccharide-23 11/03/2014   Pneumococcal-Unspecified 06/14/2017   Tdap 11/27/2019   Unspecified SARS-COV-2 Vaccination 04/22/2020, 10/28/2020   Zoster Recombinat (Shingrix) 06/28/2022    TDAP status: Up to date  Flu Vaccine status: Up to date  Pneumococcal vaccine status: Up to date  Covid-19 vaccine status: Information provided on how to obtain vaccines.   Qualifies for Shingles Vaccine? Yes   Zostavax completed Yes   Shingrix Completed?: Yes  Screening Tests Health Maintenance  Topic Date Due   COVID-19 Vaccine (6 - 2023-24 season) 02/12/2022   Zoster Vaccines- Shingrix (2 of 2) 08/23/2022   Medicare Annual Wellness (AWV)  07/02/2023   DTaP/Tdap/Td (2 - Td or Tdap) 11/26/2029   Pneumonia Vaccine 65+ Years old  Completed   INFLUENZA VACCINE  Completed   DEXA SCAN  Completed   HPV VACCINES  Aged Out    Health Maintenance  Health Maintenance Due  Topic Date Due   COVID-19 Vaccine (6 - 2023-24 season) 02/12/2022    Colorectal cancer screening: No longer required.   Mammogram status: No longer required due to aged out.  Bone Density status: Completed 11/05/20. Results reflect: Bone density results: OSTEOPOROSIS. Repeat every 2 years. Patient desires delay it  Lung Cancer Screening: (Low Dose CT Chest recommended if Age 65-80 years, 30 pack-year currently smoking OR have quit w/in 15years.) does not qualify.      Hepatitis C Screening: does not qualify;   Vision Screening: Recommended annual ophthalmology exams for early detection of glaucoma and other disorders of the eye. Is the patient up to date with their annual eye exam?  No  Who is the provider or what is the name of the office in which the patient attends annual eye exams? Dr 10/30/2020 If pt is not established with a provider, would they like to be referred to a provider to establish care? No .   Dental Screening: Recommended annual  dental exams for proper oral hygiene  Community Resource Referral / Chronic Care Management: CRR required this visit?  No   CCM required this visit?  No      Plan:     I have personally reviewed and noted the following in the patient's chart:   Medical and social history Use of alcohol, tobacco or illicit drugs  Current medications and supplements including opioid prescriptions. Patient is not currently taking opioid prescriptions. Functional ability and status Nutritional status Physical activity Advanced directives List of other physicians Hospitalizations, surgeries, and ER visits in previous 12 months Vitals Screenings to include cognitive, depression, and falls Referrals and appointments  In addition, I have reviewed and discussed with patient certain preventive protocols, quality metrics, and best practice recommendations. A written personalized care plan for preventive services as well as general preventive health recommendations were provided to patient.     Undra Trembath X Odin Mariani, NP   07/01/2022

## 2022-07-10 ENCOUNTER — Other Ambulatory Visit: Payer: Self-pay | Admitting: Nurse Practitioner

## 2022-07-22 DIAGNOSIS — H0289 Other specified disorders of eyelid: Secondary | ICD-10-CM | POA: Diagnosis not present

## 2022-07-27 DIAGNOSIS — D1801 Hemangioma of skin and subcutaneous tissue: Secondary | ICD-10-CM | POA: Diagnosis not present

## 2022-07-27 DIAGNOSIS — Z85068 Personal history of other malignant neoplasm of small intestine: Secondary | ICD-10-CM | POA: Diagnosis not present

## 2022-07-27 DIAGNOSIS — R21 Rash and other nonspecific skin eruption: Secondary | ICD-10-CM | POA: Diagnosis not present

## 2022-07-27 DIAGNOSIS — Z85810 Personal history of malignant neoplasm of tongue: Secondary | ICD-10-CM | POA: Diagnosis not present

## 2022-08-02 IMAGING — CR DG HIP (WITH OR WITHOUT PELVIS) 2-3V*R*
3 series · 3 of 3 positions shown · non-contrast
Comparison: None Available.

CLINICAL DATA: Fall onto right hip.  Concern for hip fracture.

EXAM:
DG HIP (WITH OR WITHOUT PELVIS) 2-3V RIGHT

[pelvis ap]
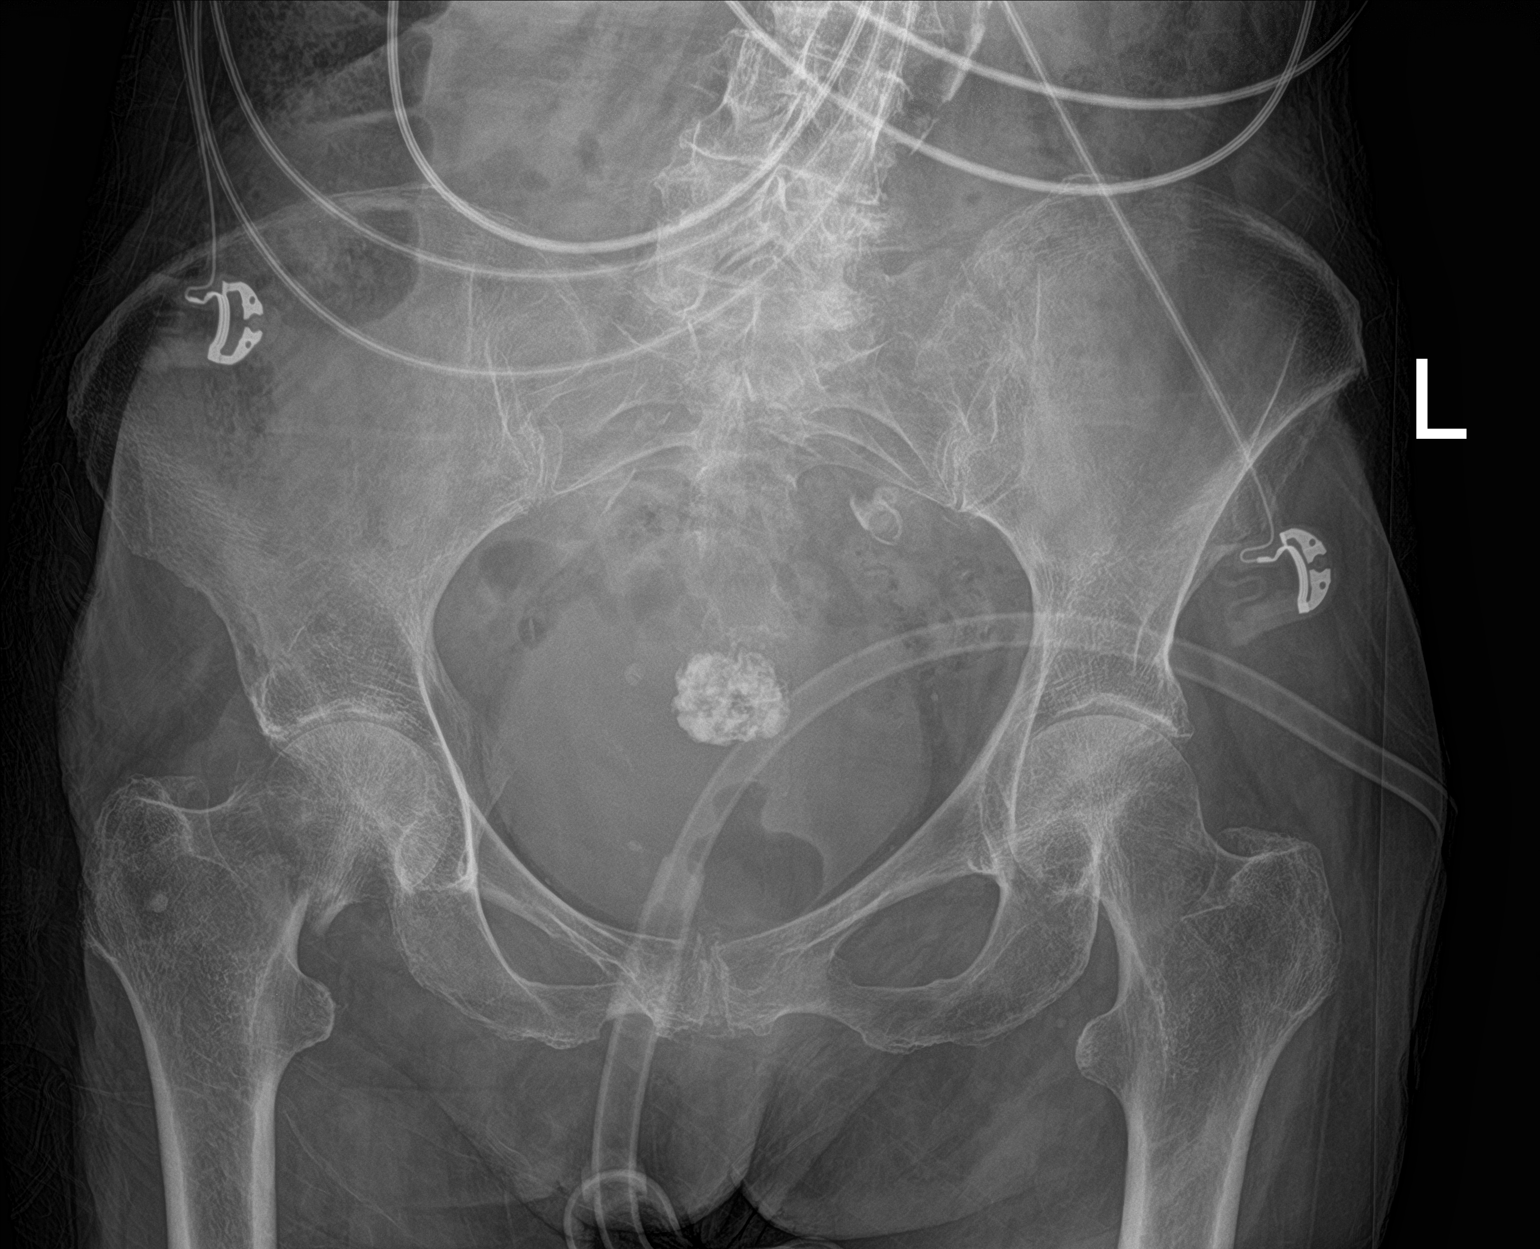

[hip x-table]
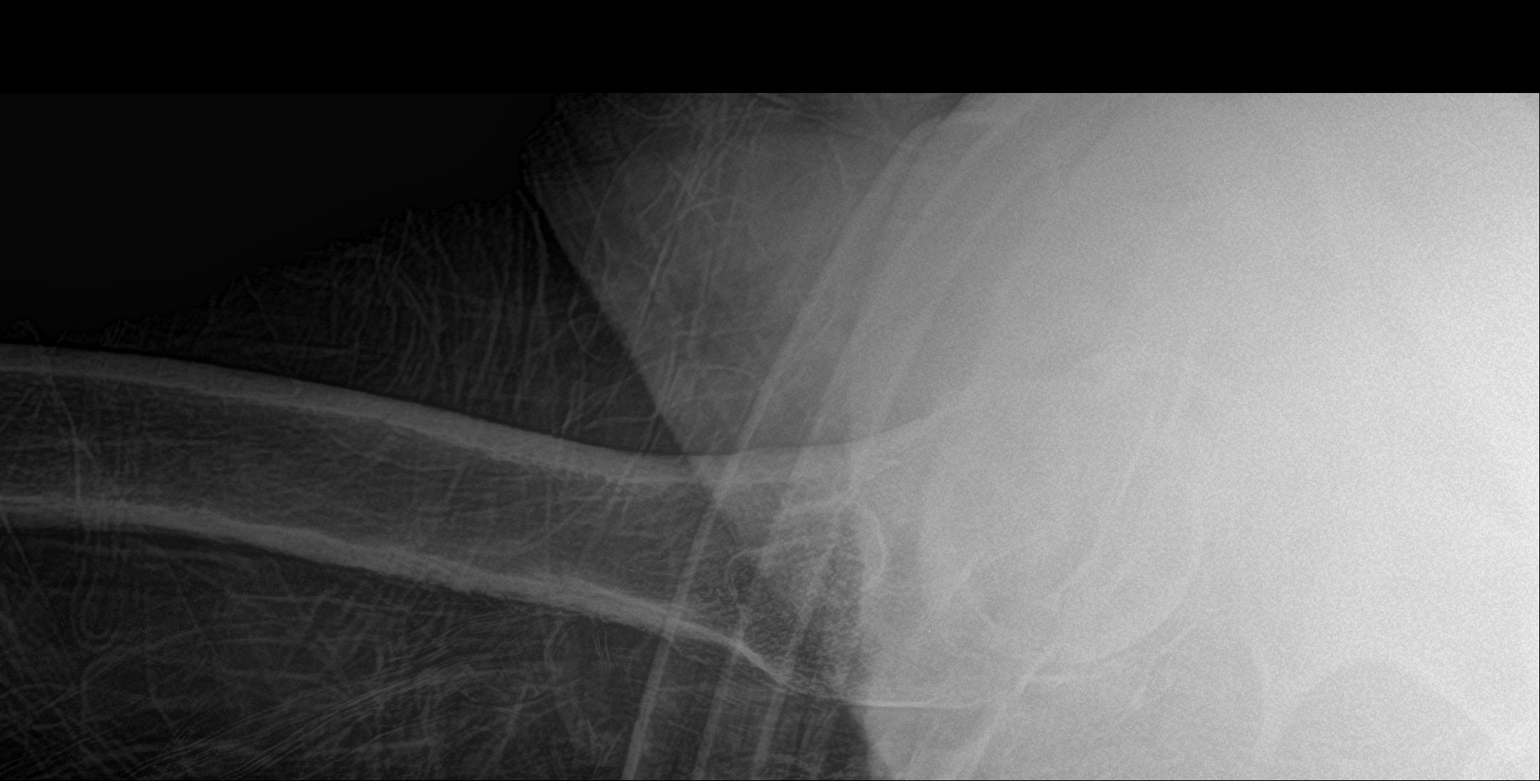

[hip ap]
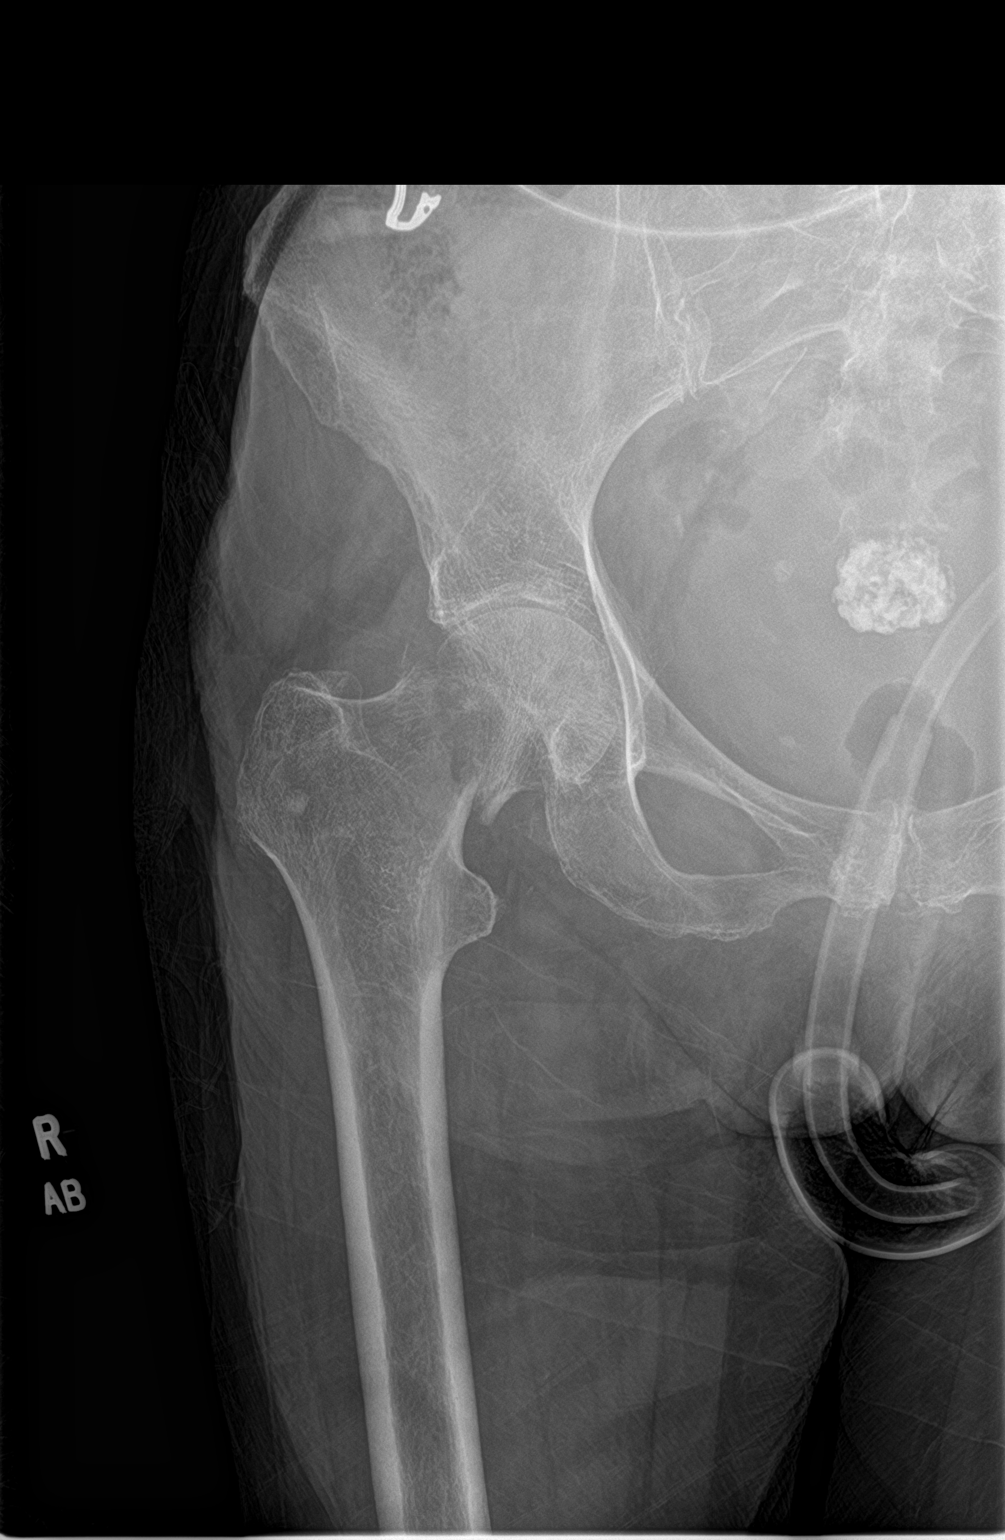

[3 of 3 positions shown; findings below may reference images not displayed]

FINDINGS: The bones are demineralized. There is an acute, mildly displaced
fracture of the right femoral neck. No evidence of acute pelvic
fracture or dislocation. Underlying mild degenerative changes of
both hips. Central pelvic calcification is likely a phlebolith.
Lower lumbar spondylosis noted.
IMPRESSION: Acute mildly displaced fracture of the right femoral neck.

## 2022-08-04 IMAGING — DX DG HIP (WITH OR WITHOUT PELVIS) 2-3V*R*
2 series · 2 of 2 positions shown · non-contrast
Comparison: Right hip radiographs 11/30/2021

CLINICAL DATA: Status post total right hip arthroplasty. PACU
image.

EXAM:
DG HIP (WITH OR WITHOUT PELVIS) 2-3V RIGHT

[pelvis ap]
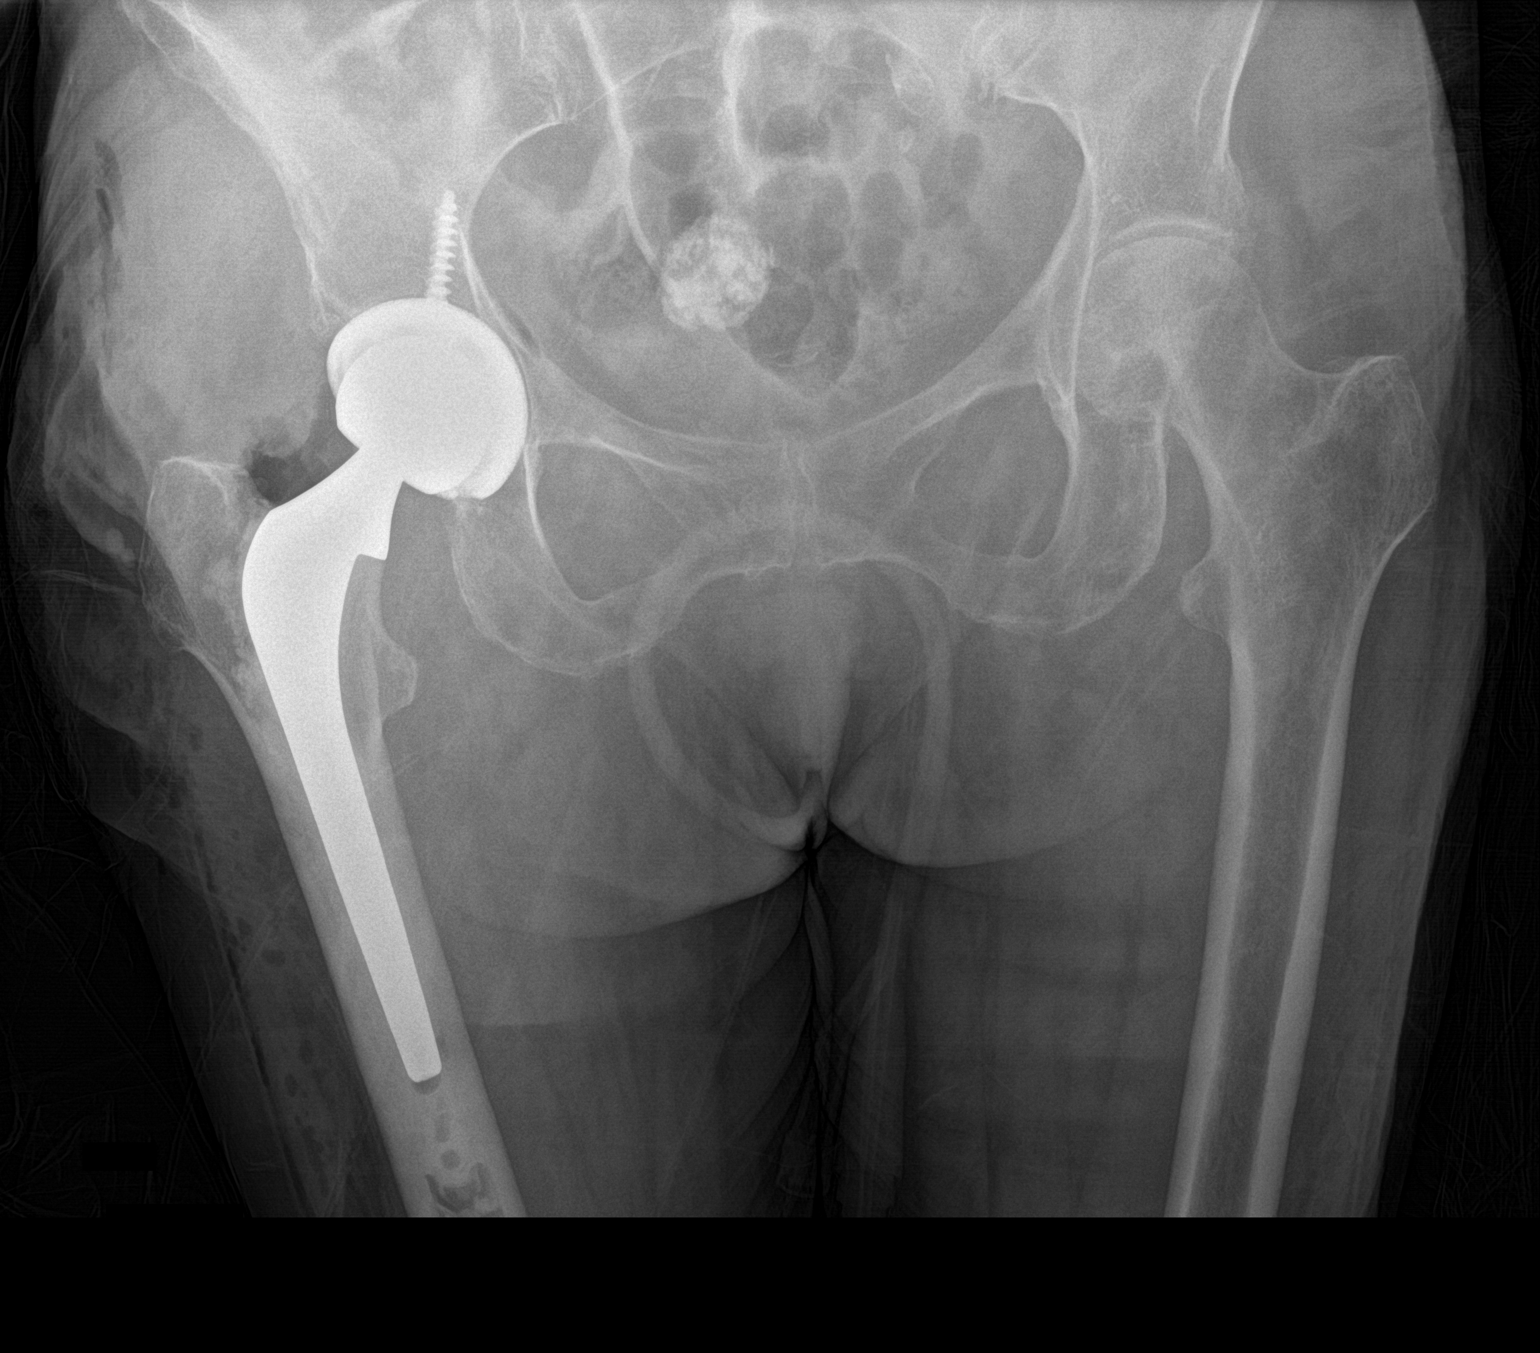

[hip lat]
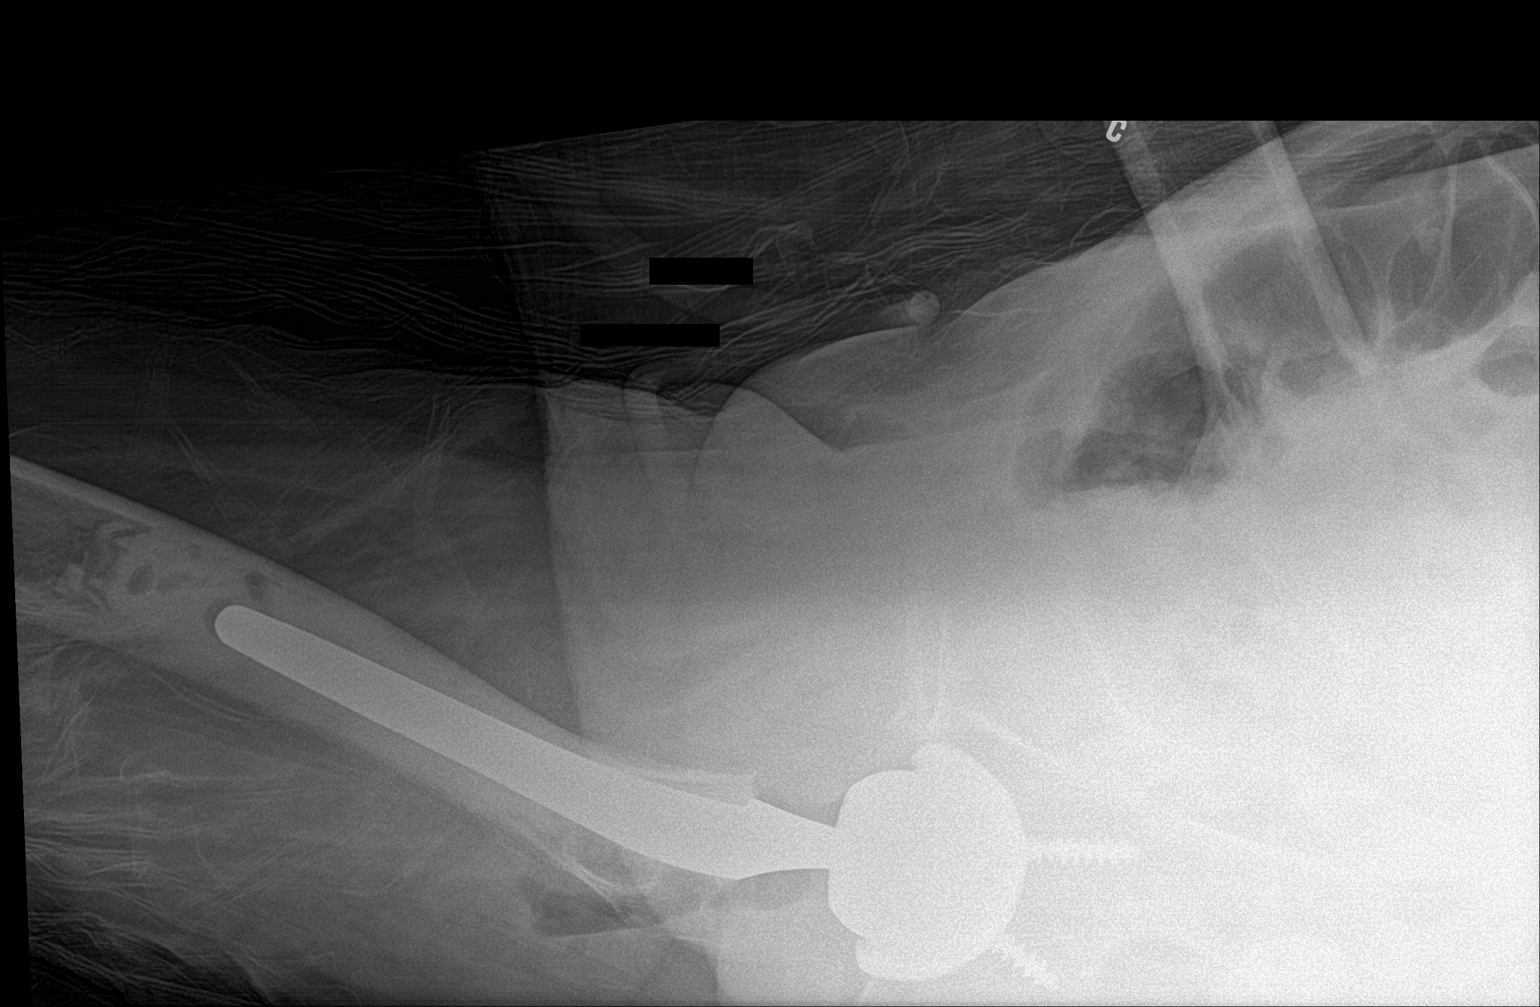

[2 of 2 positions shown; findings below may reference images not displayed]

FINDINGS: Interval total right hip arthroplasty. No perihardware lucency is
seen to indicate hardware failure or loosening. Expected
postoperative changes including lateral right hip and thigh
subcutaneous air and soft tissue swelling. Moderate left
femoroacetabular joint space narrowing. Popcorn calcifications
uterine fibroid overlies the midline pelvis. No acute fracture or
dislocation.
IMPRESSION: Interval total right hip arthroplasty without evidence of hardware
failure.

## 2022-08-10 ENCOUNTER — Other Ambulatory Visit: Payer: Self-pay | Admitting: Nurse Practitioner

## 2022-08-10 DIAGNOSIS — L120 Bullous pemphigoid: Secondary | ICD-10-CM | POA: Diagnosis not present

## 2022-08-10 DIAGNOSIS — R21 Rash and other nonspecific skin eruption: Secondary | ICD-10-CM | POA: Diagnosis not present

## 2022-08-10 DIAGNOSIS — L308 Other specified dermatitis: Secondary | ICD-10-CM | POA: Diagnosis not present

## 2022-08-10 DIAGNOSIS — D485 Neoplasm of uncertain behavior of skin: Secondary | ICD-10-CM | POA: Diagnosis not present

## 2022-08-10 DIAGNOSIS — R238 Other skin changes: Secondary | ICD-10-CM

## 2022-08-10 DIAGNOSIS — L989 Disorder of the skin and subcutaneous tissue, unspecified: Secondary | ICD-10-CM | POA: Diagnosis not present

## 2022-08-11 NOTE — Telephone Encounter (Signed)
Helene Kelp, independent nurse with with [facility] called on behalf of the patient, requesting a refill for Prednisone. I explained to Helene Kelp that the requested medication is not on the active medication list and we will need a provider to review this request as it may require an appointment to discuss.  Monina Medina-Vargas, NP is covering [place] today.  Monina please advise

## 2022-08-13 ENCOUNTER — Telehealth: Payer: Self-pay

## 2022-08-13 NOTE — Telephone Encounter (Signed)
Patient daughter is requesting patient have another refill on prednisone. She states that Dermatologist wants her to double her dosage. Please sent to [facility].

## 2022-08-19 ENCOUNTER — Other Ambulatory Visit: Payer: Self-pay | Admitting: Nurse Practitioner

## 2022-08-19 ENCOUNTER — Telehealth: Payer: Self-pay

## 2022-08-19 ENCOUNTER — Other Ambulatory Visit: Payer: Self-pay

## 2022-08-19 DIAGNOSIS — R238 Other skin changes: Secondary | ICD-10-CM

## 2022-08-19 MED ORDER — PREDNISONE 20 MG PO TABS: 20.0000 mg | ORAL_TABLET | Freq: Every day | ORAL | 2 refills | Status: DC

## 2022-08-19 MED ORDER — AMLODIPINE BESYLATE 5 MG PO TABS: 5.0000 mg | ORAL_TABLET | Freq: Every day | ORAL | 1 refills | Status: DC

## 2022-08-19 NOTE — Telephone Encounter (Signed)
Helene Kelp, with [facility] called stating that patient was supposed to have refill on prednisone over a week ago and still hasn't received it.  Information taken from 08/13/2022 telephone encounter.    08/13/2022 [facility]    Dillard, Lyndee Hensen, [place]   All Conversations (Newest Message First) August 13, 2022 Mast, Man X, NP  to [place], [facility]     08/13/22 10:53 AM Okay Thanks.       08/13/22 10:49 AM Dillard, Lyndee Hensen, CMA routed this conversation to Mast, Man X, NP [place], [place]      08/13/22 10:49 AM Note Patient daughter is requesting patient have another refill on prednisone. She states that Dermatologist wants her to double her dosage. Please sent to [facility].       Please advise and send prescription to pharmacy. Please let me know if you will/will not refill medication so I can let Helene Kelp know.  Message routed tp [facility] Mast, NP

## 2022-08-19 NOTE — Telephone Encounter (Signed)
No, this prescription needs to be sent in by PCP Mast, Man X, NP with clear instructions.

## 2022-08-24 DIAGNOSIS — R21 Rash and other nonspecific skin eruption: Secondary | ICD-10-CM | POA: Diagnosis not present

## 2022-08-30 DIAGNOSIS — D631 Anemia in chronic kidney disease: Secondary | ICD-10-CM | POA: Diagnosis not present

## 2022-08-30 DIAGNOSIS — R809 Proteinuria, unspecified: Secondary | ICD-10-CM | POA: Diagnosis not present

## 2022-08-30 DIAGNOSIS — N1832 Chronic kidney disease, stage 3b: Secondary | ICD-10-CM | POA: Diagnosis not present

## 2022-08-30 DIAGNOSIS — I129 Hypertensive chronic kidney disease with stage 1 through stage 4 chronic kidney disease, or unspecified chronic kidney disease: Secondary | ICD-10-CM | POA: Diagnosis not present

## 2022-08-30 LAB — COMPREHENSIVE METABOLIC PANEL
Albumin: 4.2 (ref 3.5–5.0)
Calcium: 10.1 (ref 8.7–10.7)

## 2022-08-30 LAB — BASIC METABOLIC PANEL
BUN: 35 — AB (ref 4–21)
CO2: 29 — AB (ref 13–22)
Chloride: 99 (ref 99–108)
Creatinine: 1.7 — AB (ref 0.5–1.1)
Glucose: 97
Potassium: 4.9 mEq/L (ref 3.5–5.1)
Sodium: 137 (ref 137–147)

## 2022-08-30 LAB — CBC AND DIFFERENTIAL: Hemoglobin: 10.5 — AB (ref 12.0–16.0)

## 2022-09-08 ENCOUNTER — Encounter: Payer: Self-pay | Admitting: Family Medicine

## 2022-09-08 ENCOUNTER — Non-Acute Institutional Stay (INDEPENDENT_AMBULATORY_CARE_PROVIDER_SITE_OTHER): Payer: Medicare Other | Admitting: Family Medicine

## 2022-09-08 VITALS — BP 127/88 | HR 87 | Temp 97.2°F | Resp 18 | Ht 65.5 in | Wt 124.5 lb

## 2022-09-08 DIAGNOSIS — S8011XA Contusion of right lower leg, initial encounter: Secondary | ICD-10-CM

## 2022-09-08 NOTE — Progress Notes (Signed)
Provider:  Alain Honey, MD  Careteam: Patient Care Team: Mast, Man X, NP as PCP - General (Internal Medicine)  PLACE OF SERVICE:  [place]  Advanced Directive information    Allergies  Allergen Reactions   Ace Inhibitors Other (See Comments)    Reaction unknown   Flagyl [Metronidazole] Other (See Comments)    Reaction unknown   Penicillins Swelling and Other (See Comments)    Facial swelling; can tolerate cephalosporins     Chief Complaint  Patient presents with   Acute Visit    Leg Swollen with oozing and bright red.     HPI: Patient is a 89 y.o. female.  Patient is here to evaluate swelling and drainage on the back of her right lower leg.  She does not recall any trauma or shearing forces but what she has is a hematoma underneath 89 year old thin skin.  There is been some minimal drainage and she has it covered with a dressing today but there is no blood or drainage on the dressing as I removed she does take aspirin 81 mg twice daily  Review of Systems:  Review of Systems  Respiratory: Negative.    Cardiovascular: Negative.   Gastrointestinal: Negative.   Endo/Heme/Allergies:  Bruises/bleeds easily.  All other systems reviewed and are negative.   Past Medical History:  Diagnosis Date   Anemia    Chronic kidney disease    stage 3   GERD (gastroesophageal reflux disease)    Hypertension    Past Surgical History:  Procedure Laterality Date   ORIF ANKLE FRACTURE Right 01/13/2021   Procedure: OPEN TREATMENT OF RIGHT TRIMALLEOLAR ANKLE FRACTURE WITHOUT POSTERIOR FIXATION, POSSIBLE SYNDESMOSIS;  Surgeon: Erle Crocker, MD;  Location: [facility];  Service: Orthopedics;  Laterality: Right;   TOTAL HIP ARTHROPLASTY Right 12/02/2021   Procedure: TOTAL HIP ARTHROPLASTY;  Surgeon: Willaim Sheng, MD;  Location: [facility];  Service: Orthopedics;  Laterality: Right;   Social History:   reports that she quit smoking about 65 years ago. Her smoking use included  cigarettes. She has never used smokeless tobacco. She reports that she does not currently use alcohol. She reports that she does not use drugs.  Family History  Problem Relation Age of Onset   Heart failure Mother    Heart disease Father    Arthritis Sister     Medications: Patient's Medications  New Prescriptions   No medications on file  Previous Medications   ACETAMINOPHEN (TYLENOL) 325 MG TABLET    Take 2 tablets (650 mg total) by mouth every 6 (six) hours as needed for mild pain, fever or headache.   AMLODIPINE (NORVASC) 5 MG TABLET    Take 1 tablet (5 mg total) by mouth daily.   CHOLECALCIFEROL (VITAMIN D) 25 MCG (1000 UNIT) TABLET    Take 2 tablets (2,000 Units total) by mouth daily.   FERROUS SULFATE 325 (65 FE) MG TABLET    Take 325 mg by mouth daily with breakfast.   FUROSEMIDE (LASIX) 20 MG TABLET    Take 1 tablet (20 mg total) by mouth daily.   MULTIPLE VITAMINS-MINERALS (ICAPS AREDS 2 PO)    Take 1 capsule by mouth daily with breakfast.   MYRBETRIQ 25 MG TB24 TABLET    TAKE 2 TABLETS BY MOUTH ONCE  DAILY   PANTOPRAZOLE (PROTONIX) 40 MG TABLET    Take 1 tablet (40 mg total) by mouth daily.   PREDNISONE (DELTASONE) 20 MG TABLET    Take 1 tablet (  20 mg total) by mouth daily with breakfast.   VITAMIN C (ASCORBIC ACID) 250 MG TABLET    Take 1 tablet (250 mg total) by mouth daily.  Modified Medications   No medications on file  Discontinued Medications   No medications on file    Physical Exam:  Vitals:   09/08/22 1454  BP: 127/88  Pulse: 87  Resp: 18  Temp: (!) 97.2 F (36.2 C)  SpO2: 96%  Weight: 124 lb 8 oz (56.5 kg)  Height: 5' 5.5" (1.664 m)   Body mass index is 20.4 kg/m. Wt Readings from Last 3 Encounters:  09/08/22 124 lb 8 oz (56.5 kg)  07/01/22 121 lb (54.9 kg)  06/16/22 120 lb (54.4 kg)    Physical Exam Vitals and nursing note reviewed.  Constitutional:      Appearance: Normal appearance.  Cardiovascular:     Rate and Rhythm: Normal rate.   Pulmonary:     Effort: Pulmonary effort is normal.  Musculoskeletal:     Comments: Right lower leg: Hematoma present.  Skin is intact over hematoma Explained could possibly debrided today or leave skin intact and hopefully be reabsorbed with some tender loving care. We opted for the latter.  Will have her see nurse for independent living for dressing changes every 2 to 3 days.  If it does open and bleed he will have to be debrided and dressed with nonadherent dressings until it heals.  Neurological:     Mental Status: She is alert.     Labs reviewed: Basic Metabolic Panel: Recent Labs    12/01/21 0449 12/02/21 0353 12/03/21 0352 05/03/22 0000  NA 137 135 135 138  K 3.6 3.4* 3.9 4.0  CL 103 100 103 102  CO2 24 24 25  25*  GLUCOSE 103* 165* 126*  --   BUN 26* 33* 28* 38*  CREATININE 1.48* 1.48* 1.38* 1.5*  CALCIUM 9.5 9.7 9.0 10.2  MG  --  2.1 1.8  --    Liver Function Tests: Recent Labs    05/03/22 0000  ALBUMIN 4.5   No results for input(s): "LIPASE", "AMYLASE" in the last 8760 hours. No results for input(s): "AMMONIA" in the last 8760 hours. CBC: Recent Labs    09/15/21 0725 11/30/21 1149 12/01/21 0449 12/02/21 0353 12/03/21 0719 12/04/21 0744 05/03/22 0000  WBC 5.6 7.0   < > 14.3* 12.4* 11.1*  --   NEUTROABS 2,929 5.0  --   --   --   --   --   HGB 9.4* 10.5*   < > 10.9* 8.7* 7.9* 10.0*  HCT 29.2* 32.7*   < > 33.5* 26.5* 24.3*  --   MCV 95.4 98.8   < > 98.0 97.8 98.8  --   PLT 230 231   < > 237 172 167  --    < > = values in this interval not displayed.   Lipid Panel: No results for input(s): "CHOL", "HDL", "LDLCALC", "TRIG", "CHOLHDL", "LDLDIRECT" in the last 8760 hours. TSH: No results for input(s): "TSH" in the last 8760 hours. A1C: No results found for: "HGBA1C"   Assessment/Plan  1. Hematoma of right lower leg As discussed above we will treat conservatively with dressing changes and follow.  Patient instructed to keep dressing dry and may also  apply some heat locally to help resolution of wound   Alain Honey, MD [place] [phone]

## 2022-09-16 DIAGNOSIS — T148XXA Other injury of unspecified body region, initial encounter: Secondary | ICD-10-CM | POA: Diagnosis not present

## 2022-09-16 DIAGNOSIS — L120 Bullous pemphigoid: Secondary | ICD-10-CM | POA: Diagnosis not present

## 2022-09-16 DIAGNOSIS — M810 Age-related osteoporosis without current pathological fracture: Secondary | ICD-10-CM | POA: Diagnosis not present

## 2022-09-23 ENCOUNTER — Non-Acute Institutional Stay: Payer: Medicare Other | Admitting: Nurse Practitioner

## 2022-09-23 VITALS — BP 124/88 | HR 93 | Temp 96.2°F | Resp 18 | Ht 65.5 in | Wt 127.0 lb

## 2022-09-23 DIAGNOSIS — K5901 Slow transit constipation: Secondary | ICD-10-CM

## 2022-09-23 DIAGNOSIS — D649 Anemia, unspecified: Secondary | ICD-10-CM | POA: Diagnosis not present

## 2022-09-23 DIAGNOSIS — M159 Polyosteoarthritis, unspecified: Secondary | ICD-10-CM

## 2022-09-23 DIAGNOSIS — N184 Chronic kidney disease, stage 4 (severe): Secondary | ICD-10-CM | POA: Diagnosis not present

## 2022-09-23 DIAGNOSIS — N3942 Incontinence without sensory awareness: Secondary | ICD-10-CM | POA: Diagnosis not present

## 2022-09-23 DIAGNOSIS — I872 Venous insufficiency (chronic) (peripheral): Secondary | ICD-10-CM

## 2022-09-23 DIAGNOSIS — L129 Pemphigoid, unspecified: Secondary | ICD-10-CM

## 2022-09-23 DIAGNOSIS — I10 Essential (primary) hypertension: Secondary | ICD-10-CM

## 2022-09-23 DIAGNOSIS — M8000XD Age-related osteoporosis with current pathological fracture, unspecified site, subsequent encounter for fracture with routine healing: Secondary | ICD-10-CM

## 2022-09-23 DIAGNOSIS — M150 Primary generalized (osteo)arthritis: Secondary | ICD-10-CM

## 2022-09-23 NOTE — Assessment & Plan Note (Signed)
chronic, acute blood loss after surgery, baseline Hgb 9s,  on Pantoprazole for GI protection, FOBT negative, Iron 112 08/26/21, Vit B12 480, EPO 9.9 09/15/21, On Fe, Hgb 10.5 08/30/22

## 2022-09-23 NOTE — Assessment & Plan Note (Signed)
off Alendronate due to CKD, 11/05/20 DEXA t score -2.8, desires delaying DEXA

## 2022-09-23 NOTE — Progress Notes (Signed)
Location:   Clinic [facility]   Place of Service:  Clinic (12) Provider: Chipper Oman NP  Code Status: DNR Goals of Care:     07/01/2022   11:09 AM  Advanced Directives  Does Patient Have a Medical Advance Directive? Yes  Type of Estate agent of [place];Out of facility DNR (pink MOST or yellow form)  Does patient want to make changes to medical advance directive? No - Patient declined  Copy of Healthcare Power of Attorney in Chart? Yes - validated most recent copy scanned in chart (See row information)  Pre-existing out of facility DNR order (yellow form or pink MOST form) Yellow form placed in chart (order not valid for inpatient use);Pink MOST form placed in chart (order not valid for inpatient use)     Chief Complaint  Patient presents with   Follow-up    Three month follow-up   Quality Metric Gaps    Needs to discuss Covid vaccine     HPI: Patient is a 89 y.o. female seen today for medical management of chronic diseases.      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              Her urinary frequency/leakage, uses pads, it seems better since taking Myrbetriq in pm per Urology             HTN, blood pressure is controlled, on Amlodipine  qd started 07/07/21 by Nephrology.              No constipation, taking Colace bid, prn MiraLax              Osteoporosis, off Alendronate due to CKD, 11/05/20 DEXA t score -2.8, desires delaying DEXA             Fecal incontinence prn Imodium  Past Medical History:  Diagnosis Date   Anemia    Chronic kidney disease    stage 3   GERD  (gastroesophageal reflux disease)    Hypertension     Past Surgical History:  Procedure Laterality Date   ORIF ANKLE FRACTURE Right 01/13/2021   Procedure: OPEN TREATMENT OF RIGHT TRIMALLEOLAR ANKLE FRACTURE WITHOUT POSTERIOR FIXATION, POSSIBLE SYNDESMOSIS;  Surgeon: Terance Hart, MD;  Location: [facility];  Service: Orthopedics;  Laterality: Right;   TOTAL HIP ARTHROPLASTY Right 12/02/2021   Procedure: TOTAL HIP ARTHROPLASTY;  Surgeon: Joen Laura, MD;  Location: [facility];  Service: Orthopedics;  Laterality: Right;    Allergies  Allergen Reactions   Ace Inhibitors Other (See Comments)    Reaction unknown   Flagyl [Metronidazole] Other (See Comments)    Reaction unknown   Penicillins Swelling and Other (See Comments)    Facial swelling; can tolerate cephalosporins     Allergies as of 09/23/2022       Reactions   Ace Inhibitors Other (See Comments)   Reaction unknown   Flagyl [metronidazole] Other (See Comments)   Reaction unknown   Penicillins Swelling, Other (See Comments)   Facial swelling; can tolerate cephalosporins        Medication List  Accurate as of September 23, 2022 11:59 PM. If you have any questions, ask your nurse or doctor.          STOP taking these medications    predniSONE 20 MG tablet Commonly known as: DELTASONE       TAKE these medications    acetaminophen 325 MG tablet Commonly known as: TYLENOL Take 2 tablets (650 mg total) by mouth every 6 (six) hours as needed for mild pain, fever or headache.   amLODipine 5 MG tablet Commonly known as: NORVASC Take 1 tablet (5 mg total) by mouth daily.   aspirin EC 81 MG tablet Take 81 mg by mouth daily. Monday, Wednesday and Friday   cholecalciferol 25 MCG (1000 UT) tablet Generic drug: Cholecalciferol Take 2 tablets (2,000 Units total) by mouth daily.   ferrous sulfate 325 (65 FE) MG tablet Take 325 mg by mouth daily with breakfast.   furosemide 20 MG tablet Commonly known  as: LASIX Take 1 tablet (20 mg total) by mouth daily.   ICAPS AREDS 2 PO Take 1 capsule by mouth daily with breakfast.   Myrbetriq 25 MG Tb24 tablet Generic drug: mirabegron ER TAKE 2 TABLETS BY MOUTH ONCE  DAILY   pantoprazole 40 MG tablet Commonly known as: PROTONIX Take 1 tablet (40 mg total) by mouth daily.   vitamin C 250 MG tablet Commonly known as: ASCORBIC ACID Take 1 tablet (250 mg total) by mouth daily.        Review of Systems:  Review of Systems  Constitutional:  Negative for appetite change, fatigue and fever.  HENT:  Positive for hearing loss. Negative for congestion and voice change.   Eyes:  Negative for visual disturbance.  Respiratory:  Negative for shortness of breath.   Cardiovascular:  Positive for leg swelling.  Gastrointestinal:  Negative for abdominal pain and constipation.       Fecal incontinent.   Genitourinary:  Positive for frequency. Negative for dysuria and urgency.       Occasionally incontinent of urine. Urination average every 4 hrs during day 1x/night.   Musculoskeletal:  Positive for arthralgias and gait problem.       Right lower leg/ankle brace  Skin:  Negative for color change.  Neurological:  Negative for speech difficulty, weakness and light-headedness.       Memory lapses.   Psychiatric/Behavioral:  Negative for behavioral problems and sleep disturbance. The patient is not nervous/anxious.     Health Maintenance  Topic Date Due   COVID-19 Vaccine (6 - 2023-24 season) 02/12/2022   Zoster Vaccines- Shingrix (2 of 2) 10/22/2022   INFLUENZA VACCINE  01/13/2023   Medicare Annual Wellness (AWV)  07/02/2023   DTaP/Tdap/Td (2 - Td or Tdap) 11/26/2029   Pneumonia Vaccine 5465+ Years old  Completed   DEXA SCAN  Completed   HPV VACCINES  Aged Out    Physical Exam: Vitals:   09/23/22 1501  BP: 124/88  Pulse: 93  Resp: 18  Temp: (!) 96.2 F (35.7 C)  SpO2: 97%  Weight: 127 lb (57.6 kg)  Height: 5' 5.5" (1.664 m)   Body mass  index is 20.81 kg/m. Physical Exam Vitals reviewed.  Constitutional:      Appearance: Normal appearance.  HENT:     Head: Normocephalic and atraumatic.     Nose: Nose normal.     Mouth/Throat:     Mouth: Mucous membranes are moist.  Eyes:     Extraocular Movements: Extraocular movements intact.  Conjunctiva/sclera: Conjunctivae normal.     Pupils: Pupils are equal, round, and reactive to light.  Cardiovascular:     Rate and Rhythm: Normal rate and regular rhythm.     Heart sounds: No murmur heard. Pulmonary:     Breath sounds: No rales.  Abdominal:     General: Bowel sounds are normal.     Palpations: Abdomen is soft.     Tenderness: There is no abdominal tenderness.  Musculoskeletal:        General: No tenderness.     Cervical back: Normal range of motion and neck supple.     Right lower leg: Edema present.     Left lower leg: Edema present.     Comments: R ankle s/p ORIF 01/13/21. Edema BLE trace. THR 11/2021  Skin:    General: Skin is warm and dry.     Findings: Bruising present.     Comments: Chronic pigmented venous insufficiency skin changes BLE. Hematoma posterior right knee is near healed.   Neurological:     Mental Status: She is alert. Mental status is at baseline.     Gait: Gait abnormal.     Comments: Oriented to person, place.   Psychiatric:        Mood and Affect: Mood normal.        Behavior: Behavior normal.        Thought Content: Thought content normal.     Labs reviewed: Basic Metabolic Panel: Recent Labs    12/01/21 0449 12/02/21 0353 12/03/21 0352 05/03/22 0000 08/30/22 0000  NA 137 135 135 138 137  K 3.6 3.4* 3.9 4.0 4.9  CL 103 100 103 102 99  CO2 24 24 25  25* 29*  GLUCOSE 103* 165* 126*  --   --   BUN 26* 33* 28* 38* 35*  CREATININE 1.48* 1.48* 1.38* 1.5* 1.7*  CALCIUM 9.5 9.7 9.0 10.2 10.1  MG  --  2.1 1.8  --   --    Liver Function Tests: Recent Labs    05/03/22 0000 08/30/22 0000  ALBUMIN 4.5 4.2   No results for  input(s): "LIPASE", "AMYLASE" in the last 8760 hours. No results for input(s): "AMMONIA" in the last 8760 hours. CBC: Recent Labs    11/30/21 1149 12/01/21 0449 12/02/21 0353 12/03/21 0719 12/04/21 0744 05/03/22 0000 08/30/22 0000  WBC 7.0   < > 14.3* 12.4* 11.1*  --   --   NEUTROABS 5.0  --   --   --   --   --   --   HGB 10.5*   < > 10.9* 8.7* 7.9* 10.0* 10.5*  HCT 32.7*   < > 33.5* 26.5* 24.3*  --   --   MCV 98.8   < > 98.0 97.8 98.8  --   --   PLT 231   < > 237 172 167  --   --    < > = values in this interval not displayed.   Lipid Panel: No results for input(s): "CHOL", "HDL", "LDLCALC", "TRIG", "CHOLHDL", "LDLDIRECT" in the last 8760 hours. No results found for: "HGBA1C"  Procedures since last visit: No results found.  Assessment/Plan  Hypertension  blood pressure is controlled, on Amlodipine 5mg  qd started 07/07/21 by Nephrology.   Slow transit constipation No constipation, taking Colace bid, prn MiraLax  Osteoporosis  off Alendronate due to CKD, 11/05/20 DEXA t score -2.8, desires delaying DEXA  Incontinent of urine  Her urinary frequency/leakage, uses pads, it seems better since  taking Myrbetriq in pm per Urology  Osteoarthritis, multiple sites OA, Hx of R ankle fx, R hip total hip arthroplasty 11/30/21, ambulates with walker.   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

## 2022-09-23 NOTE — Assessment & Plan Note (Signed)
Her urinary frequency/leakage, uses pads, it seems better since taking Myrbetriq in pm per Urology ?

## 2022-09-23 NOTE — Assessment & Plan Note (Signed)
No constipation, taking Colace bid, prn MiraLax 

## 2022-09-23 NOTE — Assessment & Plan Note (Signed)
blood pressure is controlled, on Amlodipine 5mg qd started 07/07/21 by Nephrology.  

## 2022-09-23 NOTE — Assessment & Plan Note (Signed)
legs, responded to sterid tx

## 2022-09-23 NOTE — Assessment & Plan Note (Signed)
OA, Hx of R ankle fx, R hip total hip arthroplasty 11/30/21, ambulates with walker.

## 2022-09-23 NOTE — Assessment & Plan Note (Signed)
Bun/creat  35/1.7 08/30/22, saw nephrology

## 2022-09-23 NOTE — Assessment & Plan Note (Signed)
chronic, on Furosemide, 07/2021 venous US LLE negative DVT 

## 2022-09-24 ENCOUNTER — Encounter: Payer: Self-pay | Admitting: Nurse Practitioner

## 2022-09-30 ENCOUNTER — Non-Acute Institutional Stay: Payer: Medicare Other | Admitting: Nurse Practitioner

## 2022-09-30 ENCOUNTER — Encounter: Payer: Self-pay | Admitting: Nurse Practitioner

## 2022-09-30 VITALS — BP 148/80 | HR 88 | Temp 97.3°F

## 2022-09-30 DIAGNOSIS — I872 Venous insufficiency (chronic) (peripheral): Secondary | ICD-10-CM

## 2022-09-30 DIAGNOSIS — M150 Primary generalized (osteo)arthritis: Secondary | ICD-10-CM

## 2022-09-30 DIAGNOSIS — N184 Chronic kidney disease, stage 4 (severe): Secondary | ICD-10-CM

## 2022-09-30 DIAGNOSIS — I10 Essential (primary) hypertension: Secondary | ICD-10-CM

## 2022-09-30 DIAGNOSIS — J40 Bronchitis, not specified as acute or chronic: Secondary | ICD-10-CM | POA: Diagnosis not present

## 2022-09-30 DIAGNOSIS — D649 Anemia, unspecified: Secondary | ICD-10-CM | POA: Diagnosis not present

## 2022-09-30 DIAGNOSIS — M159 Polyosteoarthritis, unspecified: Secondary | ICD-10-CM | POA: Diagnosis not present

## 2022-09-30 DIAGNOSIS — N3942 Incontinence without sensory awareness: Secondary | ICD-10-CM | POA: Diagnosis not present

## 2022-09-30 MED ORDER — AZITHROMYCIN 250 MG PO TABS: ORAL_TABLET | ORAL | 0 refills | Status: AC

## 2022-09-30 MED ORDER — GUAIFENESIN ER 600 MG PO TB12: 600.0000 mg | ORAL_TABLET | Freq: Two times a day (BID) | ORAL | 1 refills | Status: DC | PRN

## 2022-09-30 NOTE — Assessment & Plan Note (Addendum)
Cough, chest congestion, yellow phlegm, sore throat x 2 days, negative COVID, afebrile, denied chest pain or SOB Z-pk, Mucinex 600mg  bid x 5 days, f/u in clinic [facility] one week, may CXR and labs if no better

## 2022-09-30 NOTE — Assessment & Plan Note (Signed)
chronic, acute blood loss after surgery, baseline Hgb 9s,  on Pantoprazole for GI protection, FOBT negative, Iron 112 08/26/21, Vit B12 480, EPO 9.9 09/15/21, On Fe, Hgb 10.5 08/30/22 

## 2022-09-30 NOTE — Assessment & Plan Note (Signed)
off Alendronate due to CKD, 11/05/20 DEXA t score -2.8, desires delaying DEXA 

## 2022-09-30 NOTE — Assessment & Plan Note (Signed)
Her urinary frequency/leakage, uses pads, it seems better since taking Myrbetriq in pm per Urology ?

## 2022-09-30 NOTE — Assessment & Plan Note (Signed)
OA, Hx of R ankle fx, R hip total hip arthroplasty 11/30/21, ambulates with walker.  

## 2022-09-30 NOTE — Assessment & Plan Note (Signed)
35/1.7 08/30/22, saw nephrology

## 2022-09-30 NOTE — Assessment & Plan Note (Signed)
chronic, on Furosemide, 07/2021 venous US LLE negative DVT 

## 2022-09-30 NOTE — Progress Notes (Signed)
Location:   Clinic [facility]   Place of Service:  Clinic (12) Provider: Chipper Oman NP  Code Status: DNR Goals of Care:     07/01/2022   11:09 AM  Advanced Directives  Does Patient Have a Medical Advance Directive? Yes  Type of Estate agent of [place];Out of facility DNR (pink MOST or yellow form)  Does patient want to make changes to medical advance directive? No - Patient declined  Copy of Healthcare Power of Attorney in Chart? Yes - validated most recent copy scanned in chart (See row information)  Pre-existing out of facility DNR order (yellow form or pink MOST form) Yellow form placed in chart (order not valid for inpatient use);Pink MOST form placed in chart (order not valid for inpatient use)     Chief Complaint  Patient presents with   Acute Visit    Patient presents today for chest congestion for 2 days now.    HPI: Patient is a 89 y.o. female seen today for cough, chest congestion, yellow phlegm, sore throat x 2 days, negative COVID, afebrile, denied chest pain or SOB.       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              Her urinary frequency/leakage, uses pads, it seems better since taking Myrbetriq in pm per Urology             HTN, blood pressure is controlled, on Amlodipine 5mg  qd started 07/07/21 by Nephrology.              No constipation, taking Colace bid, prn MiraLax              Osteoporosis, off Alendronate due to CKD, 11/05/20 DEXA t score -2.8, desires delaying DEXA             Fecal incontinence prn Imodium  Past Medical History:  Diagnosis Date    Anemia    Chronic kidney disease    stage 3   GERD (gastroesophageal reflux disease)    Hypertension     Past Surgical History:  Procedure Laterality Date   ORIF ANKLE FRACTURE Right 01/13/2021   Procedure: OPEN TREATMENT OF RIGHT TRIMALLEOLAR ANKLE FRACTURE WITHOUT POSTERIOR FIXATION, POSSIBLE SYNDESMOSIS;  Surgeon: Terance Hart, MD;  Location: [facility] OR;  Service: Orthopedics;  Laterality: Right;   TOTAL HIP ARTHROPLASTY Right 12/02/2021   Procedure: TOTAL HIP ARTHROPLASTY;  Surgeon: Joen Laura, MD;  Location: [facility] ORS;  Service: Orthopedics;  Laterality: Right;    Allergies  Allergen Reactions   Ace Inhibitors Other (See Comments)    Reaction unknown   Flagyl [Metronidazole] Other (See Comments)    Reaction unknown   Penicillins Swelling and Other (See Comments)    Facial swelling; can tolerate cephalosporins     Allergies as of 09/30/2022       Reactions   Ace Inhibitors Other (See Comments)   Reaction unknown   Flagyl [metronidazole] Other (See Comments)   Reaction unknown   Penicillins Swelling, Other (See Comments)   Facial swelling; can tolerate cephalosporins  Medication List        Accurate as of September 30, 2022  2:48 PM. If you have any questions, ask your nurse or doctor.          acetaminophen 325 MG tablet Commonly known as: TYLENOL Take 2 tablets (650 mg total) by mouth every 6 (six) hours as needed for mild pain, fever or headache.   amLODipine 5 MG tablet Commonly known as: NORVASC Take 1 tablet (5 mg total) by mouth daily.   aspirin EC 81 MG tablet Take 81 mg by mouth daily. Monday, Wednesday and Friday   cholecalciferol 25 MCG (1000 UNIT) tablet Commonly known as: VITAMIN D3 Take 2 tablets (2,000 Units total) by mouth daily.   ferrous sulfate 325 (65 FE) MG tablet Take 325 mg by mouth daily with breakfast.   furosemide 20 MG tablet Commonly known as: LASIX Take 1 tablet (20 mg total) by mouth daily.   ICAPS AREDS 2  PO Take 1 capsule by mouth daily with breakfast.   Myrbetriq 25 MG Tb24 tablet Generic drug: mirabegron ER TAKE 2 TABLETS BY MOUTH ONCE  DAILY   pantoprazole 40 MG tablet Commonly known as: PROTONIX Take 1 tablet (40 mg total) by mouth daily.   vitamin C 250 MG tablet Commonly known as: ASCORBIC ACID Take 1 tablet (250 mg total) by mouth daily.        Review of Systems:  Review of Systems  Constitutional:  Negative for appetite change, fatigue and fever.  HENT:  Positive for hearing loss and sore throat. Negative for congestion, rhinorrhea, sinus pressure and voice change.   Eyes:  Negative for visual disturbance.  Respiratory:  Positive for cough. Negative for shortness of breath and wheezing.   Cardiovascular:  Positive for leg swelling. Negative for chest pain.  Gastrointestinal:  Negative for abdominal pain and constipation.       Fecal incontinent.   Genitourinary:  Positive for frequency. Negative for dysuria and urgency.       Occasionally incontinent of urine. Urination average every 4 hrs during day 1x/night.   Musculoskeletal:  Positive for arthralgias and gait problem.       Right lower leg/ankle brace  Skin:  Negative for color change.  Neurological:  Negative for speech difficulty, weakness and light-headedness.       Memory lapses.   Psychiatric/Behavioral:  Negative for behavioral problems and sleep disturbance. The patient is not nervous/anxious.     Health Maintenance  Topic Date Due   COVID-19 Vaccine (6 - 2023-24 season) 02/12/2022   Zoster Vaccines- Shingrix (2 of 2) 10/22/2022   INFLUENZA VACCINE  01/13/2023   Medicare Annual Wellness (AWV)  07/02/2023   DTaP/Tdap/Td (2 - Td or Tdap) 11/26/2029   Pneumonia Vaccine 88+ Years old  Completed   DEXA SCAN  Completed   HPV VACCINES  Aged Out    Physical Exam: Vitals:   09/30/22 1428 09/30/22 1446  BP: (!) 158/100 (!) 148/80  Pulse: 88   Temp: (!) 97.3 F (36.3 C)   SpO2: 96%    There is no  height or weight on file to calculate BMI. Physical Exam Vitals reviewed.  Constitutional:      Appearance: Normal appearance.  HENT:     Head: Normocephalic and atraumatic.     Nose: Nose normal.     Mouth/Throat:     Mouth: Mucous membranes are moist.  Eyes:     Extraocular Movements: Extraocular movements intact.     Conjunctiva/sclera: Conjunctivae  normal.     Pupils: Pupils are equal, round, and reactive to light.  Cardiovascular:     Rate and Rhythm: Normal rate and regular rhythm.     Heart sounds: No murmur heard. Pulmonary:     Effort: Pulmonary effort is normal.     Breath sounds: Rhonchi and rales present. No wheezing.     Comments: Central congestion, posterior left mid to lower lung rales, posterior right base rales.  Abdominal:     General: Bowel sounds are normal.     Palpations: Abdomen is soft.     Tenderness: There is no abdominal tenderness.  Musculoskeletal:        General: No tenderness.     Cervical back: Normal range of motion and neck supple.     Right lower leg: Edema present.     Left lower leg: Edema present.     Comments: R ankle s/p ORIF 01/13/21. Edema BLE trace. THR 11/2021  Skin:    General: Skin is warm and dry.     Findings: Bruising present.     Comments: Chronic pigmented venous insufficiency skin changes BLE. Hematoma posterior right knee is near healed.   Neurological:     Mental Status: She is alert. Mental status is at baseline.     Gait: Gait abnormal.     Comments: Oriented to person, place.   Psychiatric:        Mood and Affect: Mood normal.        Behavior: Behavior normal.        Thought Content: Thought content normal.     Labs reviewed: Basic Metabolic Panel: Recent Labs    12/01/21 0449 12/02/21 0353 12/03/21 0352 05/03/22 0000 08/30/22 0000  NA 137 135 135 138 137  K 3.6 3.4* 3.9 4.0 4.9  CL 103 100 103 102 99  CO2 25* 29*  GLUCOSE 103* 165* 126*  --   --   BUN 26* 33* 28* 38* 35*  CREATININE 1.48*  1.48* 1.38* 1.5* 1.7*  CALCIUM 9.5 9.7 9.0 10.2 10.1  MG  --  2.1 1.8  --   --    Liver Function Tests: Recent Labs    05/03/22 0000 08/30/22 0000  ALBUMIN 4.5 4.2   No results for input(s): "LIPASE", "AMYLASE" in the last 8760 hours. No results for input(s): "AMMONIA" in the last 8760 hours. CBC: Recent Labs    11/30/21 1149 12/01/21 0449 12/02/21 0353 12/03/21 0719 12/04/21 0744 05/03/22 0000 08/30/22 0000  WBC 7.0   < > 14.3* 12.4* 11.1*  --   --   NEUTROABS 5.0  --   --   --   --   --   --   HGB 10.5*   < > 10.9* 8.7* 7.9* 10.0* 10.5*  HCT 32.7*   < > 33.5* 26.5* 24.3*  --   --   MCV 98.8   < > 98.0 97.8 98.8  --   --   PLT 231   < > 237 172 167  --   --    < > = values in this interval not displayed.   Lipid Panel: No results for input(s): "CHOL", "HDL", "LDLCALC", "TRIG", "CHOLHDL", "LDLDIRECT" in the last 8760 hours. No results found for: "HGBA1C"  Procedures since last visit: No results found.  Assessment/Plan  Anemia chronic, acute blood loss after surgery, baseline Hgb 9s,  on Pantoprazole for GI protection, FOBT negative, Iron 112 08/26/21, Vit B12 480, EPO 9.9 09/15/21,  On Fe, Hgb 10.5 08/30/22  Venous insufficiency (chronic) (peripheral) chronic, on Furosemide, 07/2021 venous US LLE negative DVT  CKD (chronic kidney disease) stage 4, GFR 15-29 ml/min (HCC)  35/1.7 08/30/22, saw nephrology  Osteoarthritis, multiple sites OA, Hx of R ankle fx, R hip total hip arthroplasty 11/30/21, ambulates with walker.   Incontinent of urine  Her urinary frequency/leakage, uses pads, it seems better since taking Myrbetriq in pm per Urology  Hypertension  blood pressure is controlled, on Amlodipine 5mg  qd started 07/07/21 by Nephrology.   Osteoporosis off Alendronate due to CKD, 11/05/20 DEXA t score -2.8, desires delaying DEXA  Bronchitis Cough, chest congestion, yellow phlegm, sore throat x 2 days, negative COVID, afebrile, denied chest pain or SOB Z-pk, Mucinex  600mg  bid x 5 days, f/u in clinic [facility] one week, may CXR and labs if no better   Labs/tests ordered:  none  Next appt:  03/03/2023

## 2022-09-30 NOTE — Assessment & Plan Note (Signed)
blood pressure is controlled, on Amlodipine 5mg qd started 07/07/21 by Nephrology.  

## 2022-10-07 ENCOUNTER — Non-Acute Institutional Stay: Payer: Medicare Other | Admitting: Nurse Practitioner

## 2022-10-07 ENCOUNTER — Encounter: Payer: Self-pay | Admitting: Nurse Practitioner

## 2022-10-07 VITALS — BP 148/90 | HR 88 | Temp 95.9°F | Ht 65.5 in | Wt 125.8 lb

## 2022-10-07 DIAGNOSIS — I872 Venous insufficiency (chronic) (peripheral): Secondary | ICD-10-CM | POA: Diagnosis not present

## 2022-10-07 DIAGNOSIS — L129 Pemphigoid, unspecified: Secondary | ICD-10-CM

## 2022-10-07 DIAGNOSIS — J40 Bronchitis, not specified as acute or chronic: Secondary | ICD-10-CM | POA: Diagnosis not present

## 2022-10-07 NOTE — Assessment & Plan Note (Addendum)
treated with Z-pk, Mucinex, resolved sore throat, chest congestion, occasional cough with some greenish phlegm, denied chest pain, SOB, stated she is in her usual state of health,  no need for CXR. Call [facility] if cough, phlegm production worsen or new symptoms develop.

## 2022-10-07 NOTE — Assessment & Plan Note (Signed)
Edema BLE, chronic, on Furosemide, 07/2021 venous US LLE negative DVT

## 2022-10-07 NOTE — Assessment & Plan Note (Signed)
responded to sterid tx, no skin eruptions presently.

## 2022-10-07 NOTE — Progress Notes (Signed)
Location:   Clinic [facility]   Place of Service:  Clinic (12) Provider: Chipper Oman NP  Code Status: DNR Goals of Care:     07/01/2022   11:09 AM  Advanced Directives  Does Patient Have a Medical Advance Directive? Yes  Type of Estate agent of [place];Out of facility DNR (pink MOST or yellow form)  Does patient want to make changes to medical advance directive? No - Patient declined  Copy of Healthcare Power of Attorney in Chart? Yes - validated most recent copy scanned in chart (See row information)  Pre-existing out of facility DNR order (yellow form or pink MOST form) Yellow form placed in chart (order not valid for inpatient use);Pink MOST form placed in chart (order not valid for inpatient use)     Chief Complaint  Patient presents with   Medical Management of Chronic Issues    Patient presents today for 1 week follow-up    HPI: Patient is a 89 y.o. female seen today for medical management of chronic diseases.     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              Her urinary frequency/leakage, uses pads, it seems better since taking Myrbetriq in pm per Urology             HTN, blood pressure is controlled, on Amlodipine 5mg  qd started 07/07/21 by Nephrology.              No constipation, taking Colace bid, prn MiraLax              Osteoporosis, off Alendronate  due to CKD, 11/05/20 DEXA t score -2.8, desires delaying DEXA             Fecal incontinence prn Imodium    Past Medical History:  Diagnosis Date   Anemia    Chronic kidney disease    stage 3   GERD (gastroesophageal reflux disease)    Hypertension     Past Surgical History:  Procedure Laterality Date   ORIF ANKLE FRACTURE Right 01/13/2021   Procedure: OPEN TREATMENT OF RIGHT TRIMALLEOLAR ANKLE FRACTURE WITHOUT POSTERIOR FIXATION, POSSIBLE SYNDESMOSIS;  Surgeon: Terance Hart, MD;  Location: [facility] OR;  Service: Orthopedics;  Laterality: Right;   TOTAL HIP ARTHROPLASTY Right 12/02/2021   Procedure: TOTAL HIP ARTHROPLASTY;  Surgeon: Joen Laura, MD;  Location: [facility];  Service: Orthopedics;  Laterality: Right;    Allergies  Allergen Reactions   Ace Inhibitors Other (See Comments)    Reaction unknown   Flagyl [Metronidazole] Other (See Comments)    Reaction unknown   Penicillins Swelling and Other (See Comments)    Facial swelling; can tolerate cephalosporins     Allergies as of 10/07/2022       Reactions   Ace Inhibitors Other (See Comments)   Reaction unknown   Flagyl [metronidazole] Other (See Comments)  Reaction unknown   Penicillins Swelling, Other (See Comments)   Facial swelling; can tolerate cephalosporins        Medication List        Accurate as of October 07, 2022 11:59 PM. If you have any questions, ask your nurse or doctor.          acetaminophen 325 MG tablet Commonly known as: TYLENOL Take 2 tablets (650 mg total) by mouth every 6 (six) hours as needed for mild pain, fever or headache.   amLODipine 5 MG tablet Commonly known as: NORVASC Take 1 tablet (5 mg total) by mouth daily.   aspirin EC 81 MG tablet Take 81 mg by mouth daily. Monday, Wednesday and Friday   cholecalciferol 25 MCG (1000 UNIT) tablet Commonly known as: VITAMIN D3 Take 2 tablets (2,000 Units total) by mouth daily.   ferrous sulfate 325 (65 FE) MG tablet Take  325 mg by mouth daily with breakfast.   furosemide 20 MG tablet Commonly known as: LASIX Take 1 tablet (20 mg total) by mouth daily.   guaiFENesin 600 MG 12 hr tablet Commonly known as: Mucinex Take 1 tablet (600 mg total) by mouth 2 (two) times daily as needed.   ICAPS AREDS 2 PO Take 1 capsule by mouth daily with breakfast.   Myrbetriq 25 MG Tb24 tablet Generic drug: mirabegron ER TAKE 2 TABLETS BY MOUTH ONCE  DAILY   pantoprazole 40 MG tablet Commonly known as: PROTONIX Take 1 tablet (40 mg total) by mouth daily.   vitamin C 250 MG tablet Commonly known as: ASCORBIC ACID Take 1 tablet (250 mg total) by mouth daily.        Review of Systems:  Review of Systems  Constitutional:  Negative for appetite change, fatigue and fever.  HENT:  Positive for hearing loss. Negative for congestion and sore throat.   Eyes:  Negative for visual disturbance.  Respiratory:  Positive for cough. Negative for shortness of breath and wheezing.        Occasional cough, scant greenish phlegm production.   Cardiovascular:  Positive for leg swelling. Negative for chest pain.  Gastrointestinal:  Negative for abdominal pain and constipation.       Fecal incontinent.   Genitourinary:  Positive for frequency. Negative for dysuria and urgency.       Occasionally incontinent of urine. Urination average every 4 hrs during day 1x/night.   Musculoskeletal:  Positive for arthralgias and gait problem.       Right lower leg/ankle brace  Skin:  Negative for color change.  Neurological:  Negative for speech difficulty, weakness and light-headedness.       Memory lapses.   Psychiatric/Behavioral:  Negative for behavioral problems and sleep disturbance. The patient is not nervous/anxious.     Health Maintenance  Topic Date Due   COVID-19 Vaccine (6 - 2023-24 season) 02/12/2022   Zoster Vaccines- Shingrix (2 of 2) 10/22/2022   INFLUENZA VACCINE  01/13/2023   Medicare Annual Wellness (AWV)  07/02/2023    DTaP/Tdap/Td (2 - Td or Tdap) 11/26/2029   Pneumonia Vaccine 28+ Years old  Completed   DEXA SCAN  Completed   HPV VACCINES  Aged Out    Physical Exam: Vitals:   10/07/22 1320  BP: (!) 148/90  Pulse: 88  Temp: (!) 95.9 F (35.5 C)  SpO2: 97%  Weight: 125 lb 12.8 oz (57.1 kg)  Height: 5' 5.5" (1.664 m)   Body mass index is 20.62 kg/m. Physical Exam Vitals reviewed.  Constitutional:      Appearance: Normal appearance.  HENT:     Head: Normocephalic and atraumatic.     Nose: Nose normal.     Mouth/Throat:     Mouth: Mucous membranes are moist.  Eyes:     Extraocular Movements: Extraocular movements intact.     Conjunctiva/sclera: Conjunctivae normal.     Pupils: Pupils are equal, round, and reactive to light.  Cardiovascular:     Rate and Rhythm: Normal rate and regular rhythm.     Heart sounds: No murmur heard. Pulmonary:     Effort: Pulmonary effort is normal.     Breath sounds: Rales present. No wheezing or rhonchi.     Comments: Bibasilar rales.  Abdominal:     General: Bowel sounds are normal.     Palpations: Abdomen is soft.     Tenderness: There is no abdominal tenderness.  Musculoskeletal:        General: No tenderness.     Cervical back: Normal range of motion and neck supple.     Right lower leg: Edema present.     Left lower leg: Edema present.     Comments: R ankle s/p ORIF 01/13/21. Edema BLE trace. THR 11/2021  Skin:    General: Skin is warm and dry.     Comments: Chronic pigmented venous insufficiency skin changes BLE. Hematoma posterior right knee is near healed.   Neurological:     Mental Status: She is alert. Mental status is at baseline.     Gait: Gait abnormal.     Comments: Oriented to person, place.   Psychiatric:        Mood and Affect: Mood normal.        Behavior: Behavior normal.        Thought Content: Thought content normal.     Labs reviewed: Basic Metabolic Panel: Recent Labs    12/01/21 0449 12/02/21 0353 12/03/21 0352  05/03/22 0000 08/30/22 0000  NA 137 135 135 138 137  K 3.6 3.4* 3.9 4.0 4.9  CL 103 100 103 102 99  CO2 24 24 25  25* 29*  GLUCOSE 103* 165* 126*  --   --   BUN 26* 33* 28* 38* 35*  CREATININE 1.48* 1.48* 1.38* 1.5* 1.7*  CALCIUM 9.5 9.7 9.0 10.2 10.1  MG  --  2.1 1.8  --   --    Liver Function Tests: Recent Labs    05/03/22 0000 08/30/22 0000  ALBUMIN 4.5 4.2   No results for input(s): "LIPASE", "AMYLASE" in the last 8760 hours. No results for input(s): "AMMONIA" in the last 8760 hours. CBC: Recent Labs    11/30/21 1149 12/01/21 0449 12/02/21 0353 12/03/21 0719 12/04/21 0744 05/03/22 0000 08/30/22 0000  WBC 7.0   < > 14.3* 12.4* 11.1*  --   --   NEUTROABS 5.0  --   --   --   --   --   --   HGB 10.5*   < > 10.9* 8.7* 7.9* 10.0* 10.5*  HCT 32.7*   < > 33.5* 26.5* 24.3*  --   --   MCV 98.8   < > 98.0 97.8 98.8  --   --   PLT 231   < > 237 172 167  --   --    < > = values in this interval not displayed.   Lipid Panel: No results for input(s): "CHOL", "HDL", "LDLCALC", "TRIG", "CHOLHDL", "LDLDIRECT" in the last 8760 hours. No results found for: "  HGBA1C"  Procedures since last visit: No results found.  Assessment/Plan  Bronchitis  treated with Z-pk, Mucinex, resolved sore throat, chest congestion, occasional cough with some greenish phlegm, denied chest pain, SOB, stated she is in her usual state of health,  no need for CXR. Call [facility] if cough, phlegm production worsen or new symptoms develop.   Pemphigoid  responded to sterid tx, no skin eruptions presently.   Venous insufficiency (chronic) (peripheral) Edema BLE, chronic, on Furosemide, 07/2021 venous US LLE negative DVT  Hypertension blood pressure is controlled, on Amlodipine  qd started 07/07/21 by Nephrology.    Labs/tests ordered:  none  Next appt:  03/03/2023

## 2022-10-07 NOTE — Assessment & Plan Note (Signed)
blood pressure is controlled, on Amlodipine 5mg qd started 07/07/21 by Nephrology.  

## 2022-10-08 ENCOUNTER — Encounter: Payer: Self-pay | Admitting: Nurse Practitioner

## 2022-10-19 DIAGNOSIS — L120 Bullous pemphigoid: Secondary | ICD-10-CM | POA: Diagnosis not present

## 2022-10-19 DIAGNOSIS — M810 Age-related osteoporosis without current pathological fracture: Secondary | ICD-10-CM | POA: Diagnosis not present

## 2022-11-04 ENCOUNTER — Other Ambulatory Visit: Payer: Self-pay | Admitting: Nurse Practitioner

## 2022-11-05 ENCOUNTER — Ambulatory Visit (INDEPENDENT_AMBULATORY_CARE_PROVIDER_SITE_OTHER): Payer: Medicare Other | Admitting: Orthopedic Surgery

## 2022-11-05 ENCOUNTER — Encounter: Payer: Self-pay | Admitting: Orthopedic Surgery

## 2022-11-05 VITALS — BP 148/90 | HR 79 | Temp 97.1°F | Resp 16 | Ht 65.5 in | Wt 132.4 lb

## 2022-11-05 DIAGNOSIS — I872 Venous insufficiency (chronic) (peripheral): Secondary | ICD-10-CM | POA: Diagnosis not present

## 2022-11-05 DIAGNOSIS — L03116 Cellulitis of left lower limb: Secondary | ICD-10-CM

## 2022-11-05 MED ORDER — FUROSEMIDE 20 MG PO TABS: 20.0000 mg | ORAL_TABLET | Freq: Every day | ORAL | 0 refills | Status: DC

## 2022-11-05 MED ORDER — DOXYCYCLINE HYCLATE 100 MG PO TABS: 100.0000 mg | ORAL_TABLET | Freq: Two times a day (BID) | ORAL | 0 refills | Status: AC

## 2022-11-05 NOTE — Progress Notes (Signed)
Careteam: Patient Care Team: Mast, Man X, NP as PCP - General (Internal Medicine)  Seen by: Hazle Nordmann, AGNP-C  PLACE OF SERVICE:  [facility]  Advanced Directive information Does Patient Have a Medical Advance Directive?: Yes, Type of Advance Directive: Healthcare Power of [place];Living will;Out of facility DNR (pink MOST or yellow form), Does patient want to make changes to medical advance directive?: No - Patient declined  Allergies  Allergen Reactions   Ace Inhibitors Other (See Comments)    Reaction unknown   Flagyl [Metronidazole] Other (See Comments)    Reaction unknown   Penicillins Swelling and Other (See Comments)    Facial swelling; can tolerate cephalosporins     Chief Complaint  Patient presents with   Acute Visit    Patient complains of swollen left leg.Marland Kitchen      HPI: Patient is a 89 y.o. female seen today for acute visit due to increased left leg swelling and redness.   PMH: HTN, venous insufficiency, bronchitis, constipation,OA, osteoporosis, CKD stage 4, anemia, and abnormal gait.   Increased left leg swelling and mild erythema x 2 days. No recent fall or injury. She denies calf pain, left leg warmth/pain, sob or chest pain. She currently takes furosemide 20 mg po daily for chronic venous insufficiency. Adheres to low sodium diet. Does not always elevated legs.  Afebrile. Vitals stable.   Review of Systems:  Review of Systems  Constitutional:  Negative for fever and malaise/fatigue.  Respiratory:  Negative for cough, shortness of breath and wheezing.   Cardiovascular:  Positive for leg swelling. Negative for chest pain.  Musculoskeletal:  Negative for falls, joint pain and myalgias.  Neurological:  Positive for weakness. Negative for dizziness and sensory change.  Psychiatric/Behavioral:  Negative for depression. The patient is not nervous/anxious.     Past Medical History:  Diagnosis Date   Anemia    Chronic kidney disease    stage 3   GERD  (gastroesophageal reflux disease)    Hypertension    Past Surgical History:  Procedure Laterality Date   ORIF ANKLE FRACTURE Right 01/13/2021   Procedure: OPEN TREATMENT OF RIGHT TRIMALLEOLAR ANKLE FRACTURE WITHOUT POSTERIOR FIXATION, POSSIBLE SYNDESMOSIS;  Surgeon: Terance Hart, MD;  Location: [facility] OR;  Service: Orthopedics;  Laterality: Right;   TOTAL HIP ARTHROPLASTY Right 12/02/2021   Procedure: TOTAL HIP ARTHROPLASTY;  Surgeon: Joen Laura, MD;  Location: [facility] ORS;  Service: Orthopedics;  Laterality: Right;   Social History:   reports that she quit smoking about 65 years ago. Her smoking use included cigarettes. She has never used smokeless tobacco. She reports that she does not currently use alcohol. She reports that she does not use drugs.  Family History  Problem Relation Age of Onset   Heart failure Mother    Heart disease Father    Arthritis Sister     Medications: Patient's Medications  New Prescriptions   DOXYCYCLINE (VIBRA-TABS) 100 MG TABLET    Take 1 tablet (100 mg total) by mouth 2 (two) times daily for 7 days.  Previous Medications   ACETAMINOPHEN (TYLENOL) 325 MG TABLET    Take 2 tablets (650 mg total) by mouth every 6 (six) hours as needed for mild pain, fever or headache.   AMLODIPINE (NORVASC) 5 MG TABLET    Take 1 tablet (5 mg total) by mouth daily.   ASPIRIN EC 81 MG TABLET    Take 81 mg by mouth daily. Monday, Wednesday and Friday   CHOLECALCIFEROL (VITAMIN D3)  25 MCG (1000 UNIT) TABLET    Take 4,000 Units by mouth once a week.   FERROUS SULFATE 325 (65 FE) MG TABLET    Take 325 mg by mouth daily with breakfast.   MULTIPLE VITAMINS-MINERALS (ICAPS AREDS 2 PO)    Take 1 capsule by mouth daily with breakfast.   MYRBETRIQ 25 MG TB24 TABLET    TAKE 2 TABLETS BY MOUTH ONCE  DAILY   PANTOPRAZOLE (PROTONIX) 40 MG TABLET    Take 1 tablet (40 mg total) by mouth daily.   VITAMIN C (ASCORBIC ACID) 250 MG TABLET    Take 1 tablet (250 mg total) by mouth daily.   Modified Medications   Modified Medication Previous Medication   FUROSEMIDE (LASIX) 20 MG TABLET furosemide (LASIX) 20 MG tablet      Take 1 tablet (20 mg total) by mouth daily.    Take 1 tablet (20 mg total) by mouth daily.  Discontinued Medications   CHOLECALCIFEROL (VITAMIN D) 25 MCG (1000 UNIT) TABLET    Take 2 tablets (2,000 Units total) by mouth daily.   GUAIFENESIN (MUCINEX) 600 MG 12 HR TABLET    Take 1 tablet (600 mg total) by mouth 2 (two) times daily as needed.    Physical Exam:  Vitals:   11/05/22 1347  BP: (!) 148/90  Pulse: 79  Resp: 16  Temp: (!) 97.1 F (36.2 C)  SpO2: 94%  Weight: 132 lb 6.4 oz (60.1 kg)  Height: 5' 5.5" (1.664 m)   Body mass index is 21.7 kg/m. Wt Readings from Last 3 Encounters:  11/05/22 132 lb 6.4 oz (60.1 kg)  10/07/22 125 lb 12.8 oz (57.1 kg)  09/23/22 127 lb (57.6 kg)    Physical Exam Vitals reviewed.  Constitutional:      General: She is not in acute distress. HENT:     Head: Normocephalic.  Eyes:     General:        Right eye: No discharge.        Left eye: No discharge.  Cardiovascular:     Rate and Rhythm: Normal rate and regular rhythm.     Pulses: Normal pulses.     Heart sounds: Normal heart sounds.  Pulmonary:     Effort: Pulmonary effort is normal. No respiratory distress.     Breath sounds: Normal breath sounds. No wheezing or rales.  Abdominal:     General: Bowel sounds are normal.     Palpations: Abdomen is soft.  Musculoskeletal:     Cervical back: Neck supple.     Right lower leg: No edema.     Left lower leg: Edema present.     Comments: 2+ pitting edema to LLE  Skin:    General: Skin is warm.     Capillary Refill: Capillary refill takes less than 2 seconds.     Findings: Erythema present.     Comments: LLE with dry flaking skin, skin with leathery appearance, some areas of erythema, tender/cool to touch, no open lesions or drainage.   Neurological:     General: No focal deficit present.     Mental  Status: She is alert and oriented to person, place, and time.     Motor: Weakness present.     Gait: Gait abnormal.  Psychiatric:        Mood and Affect: Mood normal.     Labs reviewed: Basic Metabolic Panel: Recent Labs    12/01/21 0449 12/02/21 0353 12/03/21 0352 05/03/22 0000 08/30/22 0000  NA 137 135 135 138 137  K 3.6 3.4* 3.9 4.0 4.9  CL 103 100 103 102 99  CO2 24 24 25  25* 29*  GLUCOSE 103* 165* 126*  --   --   BUN 26* 33* 28* 38* 35*  CREATININE 1.48* 1.48* 1.38* 1.5* 1.7*  CALCIUM 9.5 9.7 9.0 10.2 10.1  MG  --  2.1 1.8  --   --    Liver Function Tests: Recent Labs    05/03/22 0000 08/30/22 0000  ALBUMIN 4.5 4.2   No results for input(s): "LIPASE", "AMYLASE" in the last 8760 hours. No results for input(s): "AMMONIA" in the last 8760 hours. CBC: Recent Labs    11/30/21 1149 12/01/21 0449 12/02/21 0353 12/03/21 0719 12/04/21 0744 05/03/22 0000 08/30/22 0000  WBC 7.0   < > 14.3* 12.4* 11.1*  --   --   NEUTROABS 5.0  --   --   --   --   --   --   HGB 10.5*   < > 10.9* 8.7* 7.9* 10.0* 10.5*  HCT 32.7*   < > 33.5* 26.5* 24.3*  --   --   MCV 98.8   < > 98.0 97.8 98.8  --   --   PLT 231   < > 237 172 167  --   --    < > = values in this interval not displayed.   Lipid Panel: No results for input(s): "CHOL", "HDL", "LDLCALC", "TRIG", "CHOLHDL", "LDLDIRECT" in the last 8760 hours. TSH: No results for input(s): "TSH" in the last 8760 hours. A1C: No results found for: "HGBA1C"   Assessment/Plan 1. Venous insufficiency (chronic) (peripheral) - 2+ pitting edema x 2 days - advised to increase furosemide to 40 mg po daily x 3 days, then continue 20 mg daily - elevated legs 2-3 hours daily - use compression hose when swelling has subsided - limit sodium in diet - contact PCP if symptoms do not improve or worsen - furosemide (LASIX) 20 MG tablet; Take 1 tablet (20 mg total) by mouth daily.  Dispense: 90 tablet; Refill: 0  2. Cellulitis of left anterior  lower leg - some areas of erythema and tenderness to LLE, afebrile - skin dry and flaking - will start doxycycline  - doxycycline (VIBRA-TABS) 100 MG tablet; Take 1 tablet (100 mg total) by mouth 2 (two) times daily for 7 days.  Dispense: 14 tablet; Refill: 0  Total time: 28 minutes. Greater than 50% of total time spent doing patient education regarding LLE swelling and cellulitis including symptom/medication management.    Next appt: Visit date not found  Raeden Schippers Scherry Ran  [facility] [phone]

## 2022-11-05 NOTE — Patient Instructions (Signed)
Increase furosemide to 40 mg by mouth daily x 3 days. You may take extra furosemide 20 mg when you get home today  Will also start doxycycline 100 mg twice daily x 7 days  Elevate legs in recliner daily- 2-3 hours  Limit salt in diet  Use compression hose for prevention when swelling subsides

## 2022-11-17 DIAGNOSIS — M6281 Muscle weakness (generalized): Secondary | ICD-10-CM | POA: Diagnosis not present

## 2022-11-17 DIAGNOSIS — N3946 Mixed incontinence: Secondary | ICD-10-CM | POA: Diagnosis not present

## 2022-11-17 DIAGNOSIS — R2681 Unsteadiness on feet: Secondary | ICD-10-CM | POA: Diagnosis not present

## 2022-11-17 DIAGNOSIS — R41841 Cognitive communication deficit: Secondary | ICD-10-CM | POA: Diagnosis not present

## 2022-11-18 DIAGNOSIS — L905 Scar conditions and fibrosis of skin: Secondary | ICD-10-CM | POA: Diagnosis not present

## 2022-11-18 DIAGNOSIS — L120 Bullous pemphigoid: Secondary | ICD-10-CM | POA: Diagnosis not present

## 2022-11-18 DIAGNOSIS — M810 Age-related osteoporosis without current pathological fracture: Secondary | ICD-10-CM | POA: Diagnosis not present

## 2022-11-19 DIAGNOSIS — M6281 Muscle weakness (generalized): Secondary | ICD-10-CM | POA: Diagnosis not present

## 2022-11-19 DIAGNOSIS — N3946 Mixed incontinence: Secondary | ICD-10-CM | POA: Diagnosis not present

## 2022-11-19 DIAGNOSIS — R41841 Cognitive communication deficit: Secondary | ICD-10-CM | POA: Diagnosis not present

## 2022-11-19 DIAGNOSIS — R2681 Unsteadiness on feet: Secondary | ICD-10-CM | POA: Diagnosis not present

## 2022-11-22 DIAGNOSIS — M6281 Muscle weakness (generalized): Secondary | ICD-10-CM | POA: Diagnosis not present

## 2022-11-22 DIAGNOSIS — N3946 Mixed incontinence: Secondary | ICD-10-CM | POA: Diagnosis not present

## 2022-11-22 DIAGNOSIS — R2681 Unsteadiness on feet: Secondary | ICD-10-CM | POA: Diagnosis not present

## 2022-11-22 DIAGNOSIS — R41841 Cognitive communication deficit: Secondary | ICD-10-CM | POA: Diagnosis not present

## 2022-11-24 DIAGNOSIS — N3946 Mixed incontinence: Secondary | ICD-10-CM | POA: Diagnosis not present

## 2022-11-24 DIAGNOSIS — R2681 Unsteadiness on feet: Secondary | ICD-10-CM | POA: Diagnosis not present

## 2022-11-24 DIAGNOSIS — R41841 Cognitive communication deficit: Secondary | ICD-10-CM | POA: Diagnosis not present

## 2022-11-24 DIAGNOSIS — M6281 Muscle weakness (generalized): Secondary | ICD-10-CM | POA: Diagnosis not present

## 2022-11-26 DIAGNOSIS — R41841 Cognitive communication deficit: Secondary | ICD-10-CM | POA: Diagnosis not present

## 2022-11-26 DIAGNOSIS — N3946 Mixed incontinence: Secondary | ICD-10-CM | POA: Diagnosis not present

## 2022-11-26 DIAGNOSIS — R2681 Unsteadiness on feet: Secondary | ICD-10-CM | POA: Diagnosis not present

## 2022-11-26 DIAGNOSIS — M6281 Muscle weakness (generalized): Secondary | ICD-10-CM | POA: Diagnosis not present

## 2022-11-29 DIAGNOSIS — N3946 Mixed incontinence: Secondary | ICD-10-CM | POA: Diagnosis not present

## 2022-11-29 DIAGNOSIS — M6281 Muscle weakness (generalized): Secondary | ICD-10-CM | POA: Diagnosis not present

## 2022-11-29 DIAGNOSIS — R41841 Cognitive communication deficit: Secondary | ICD-10-CM | POA: Diagnosis not present

## 2022-11-29 DIAGNOSIS — R2681 Unsteadiness on feet: Secondary | ICD-10-CM | POA: Diagnosis not present

## 2022-12-01 DIAGNOSIS — M6281 Muscle weakness (generalized): Secondary | ICD-10-CM | POA: Diagnosis not present

## 2022-12-01 DIAGNOSIS — R2681 Unsteadiness on feet: Secondary | ICD-10-CM | POA: Diagnosis not present

## 2022-12-01 DIAGNOSIS — R41841 Cognitive communication deficit: Secondary | ICD-10-CM | POA: Diagnosis not present

## 2022-12-01 DIAGNOSIS — N3946 Mixed incontinence: Secondary | ICD-10-CM | POA: Diagnosis not present

## 2022-12-03 DIAGNOSIS — N3946 Mixed incontinence: Secondary | ICD-10-CM | POA: Diagnosis not present

## 2022-12-03 DIAGNOSIS — R2681 Unsteadiness on feet: Secondary | ICD-10-CM | POA: Diagnosis not present

## 2022-12-03 DIAGNOSIS — M6281 Muscle weakness (generalized): Secondary | ICD-10-CM | POA: Diagnosis not present

## 2022-12-03 DIAGNOSIS — R41841 Cognitive communication deficit: Secondary | ICD-10-CM | POA: Diagnosis not present

## 2022-12-06 DIAGNOSIS — R41841 Cognitive communication deficit: Secondary | ICD-10-CM | POA: Diagnosis not present

## 2022-12-06 DIAGNOSIS — N3946 Mixed incontinence: Secondary | ICD-10-CM | POA: Diagnosis not present

## 2022-12-06 DIAGNOSIS — M6281 Muscle weakness (generalized): Secondary | ICD-10-CM | POA: Diagnosis not present

## 2022-12-06 DIAGNOSIS — R2681 Unsteadiness on feet: Secondary | ICD-10-CM | POA: Diagnosis not present

## 2022-12-07 DIAGNOSIS — R2681 Unsteadiness on feet: Secondary | ICD-10-CM | POA: Diagnosis not present

## 2022-12-07 DIAGNOSIS — M6281 Muscle weakness (generalized): Secondary | ICD-10-CM | POA: Diagnosis not present

## 2022-12-07 DIAGNOSIS — N3946 Mixed incontinence: Secondary | ICD-10-CM | POA: Diagnosis not present

## 2022-12-07 DIAGNOSIS — R41841 Cognitive communication deficit: Secondary | ICD-10-CM | POA: Diagnosis not present

## 2022-12-08 DIAGNOSIS — R600 Localized edema: Secondary | ICD-10-CM | POA: Diagnosis not present

## 2022-12-08 DIAGNOSIS — N3946 Mixed incontinence: Secondary | ICD-10-CM | POA: Diagnosis not present

## 2022-12-08 DIAGNOSIS — R41841 Cognitive communication deficit: Secondary | ICD-10-CM | POA: Diagnosis not present

## 2022-12-08 DIAGNOSIS — L120 Bullous pemphigoid: Secondary | ICD-10-CM | POA: Diagnosis not present

## 2022-12-08 DIAGNOSIS — R2681 Unsteadiness on feet: Secondary | ICD-10-CM | POA: Diagnosis not present

## 2022-12-08 DIAGNOSIS — M6281 Muscle weakness (generalized): Secondary | ICD-10-CM | POA: Diagnosis not present

## 2022-12-09 ENCOUNTER — Non-Acute Institutional Stay: Payer: Medicare Other | Admitting: Nurse Practitioner

## 2022-12-09 ENCOUNTER — Encounter: Payer: Self-pay | Admitting: Nurse Practitioner

## 2022-12-09 VITALS — BP 130/82 | HR 86 | Temp 97.5°F | Resp 18 | Ht 65.5 in | Wt 132.0 lb

## 2022-12-09 DIAGNOSIS — M6281 Muscle weakness (generalized): Secondary | ICD-10-CM | POA: Diagnosis not present

## 2022-12-09 DIAGNOSIS — N184 Chronic kidney disease, stage 4 (severe): Secondary | ICD-10-CM

## 2022-12-09 DIAGNOSIS — L129 Pemphigoid, unspecified: Secondary | ICD-10-CM

## 2022-12-09 DIAGNOSIS — I872 Venous insufficiency (chronic) (peripheral): Secondary | ICD-10-CM | POA: Diagnosis not present

## 2022-12-09 DIAGNOSIS — I10 Essential (primary) hypertension: Secondary | ICD-10-CM | POA: Diagnosis not present

## 2022-12-09 DIAGNOSIS — D649 Anemia, unspecified: Secondary | ICD-10-CM | POA: Diagnosis not present

## 2022-12-09 DIAGNOSIS — N3946 Mixed incontinence: Secondary | ICD-10-CM | POA: Diagnosis not present

## 2022-12-09 DIAGNOSIS — N3942 Incontinence without sensory awareness: Secondary | ICD-10-CM

## 2022-12-09 DIAGNOSIS — M159 Polyosteoarthritis, unspecified: Secondary | ICD-10-CM

## 2022-12-09 DIAGNOSIS — R2681 Unsteadiness on feet: Secondary | ICD-10-CM | POA: Diagnosis not present

## 2022-12-09 DIAGNOSIS — R41841 Cognitive communication deficit: Secondary | ICD-10-CM | POA: Diagnosis not present

## 2022-12-09 MED ORDER — FUROSEMIDE 20 MG PO TABS
40.0000 mg | ORAL_TABLET | Freq: Every day | ORAL | 3 refills | Status: DC
Start: 2022-12-09 — End: 2023-01-19

## 2022-12-09 NOTE — Assessment & Plan Note (Signed)
Hx of R ankle fx, R hip total hip arthroplasty 11/30/21, ambulates with walker.  

## 2022-12-09 NOTE — Assessment & Plan Note (Signed)
uses pads, it seems better since taking Myrbetriq in pm per Urology

## 2022-12-09 NOTE — Progress Notes (Signed)
Location:   [facility] Room Number: [place]/2105/P Place of Service:  Clinic (12) Provider: Chipper Oman NP  Code Status: DNR Goals of Care:     11/05/2022    1:51 PM  Advanced Directives  Does Patient Have a Medical Advance Directive? Yes  Type of Estate agent of [place];Living will;Out of facility DNR (pink MOST or yellow form)  Does patient want to make changes to medical advance directive? No - Patient declined  Copy of Healthcare Power of Attorney in Chart? Yes - validated most recent copy scanned in chart (See row information)     Chief Complaint  Patient presents with   Acute Visit    Patient is being seen for blisters on her lower extremities    HPI: Patient is a 89 y.o. female seen today for medical management of chronic diseases.     LLE cellulitis, treated with Doxy 11/05/22 x 7 days.  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              Her urinary frequency/leakage, uses pads, it seems better since taking Myrbetriq in pm per Urology             HTN, blood pressure is controlled, on Amlodipine 5mg  qd started 07/07/21 by Nephrology.              No constipation, taking Colace bid, prn MiraLax              Osteoporosis, off Alendronate due to CKD, 11/05/20 DEXA t score -2.8, desires delaying DEXA             Fecal incontinence prn Imodium     Past Medical History:  Diagnosis Date   Anemia    Chronic kidney disease    stage 3   GERD (gastroesophageal reflux disease)    Hypertension     Past Surgical History:  Procedure Laterality Date   ORIF  ANKLE FRACTURE Right 01/13/2021   Procedure: OPEN TREATMENT OF RIGHT TRIMALLEOLAR ANKLE FRACTURE WITHOUT POSTERIOR FIXATION, POSSIBLE SYNDESMOSIS;  Surgeon: Terance Hart, MD;  Location: [facility] OR;  Service: Orthopedics;  Laterality: Right;   TOTAL HIP ARTHROPLASTY Right 12/02/2021   Procedure: TOTAL HIP ARTHROPLASTY;  Surgeon: Joen Laura, MD;  Location: [facility] ORS;  Service: Orthopedics;  Laterality: Right;    Allergies  Allergen Reactions   Ace Inhibitors Other (See Comments)    Reaction unknown   Flagyl [Metronidazole] Other (See Comments)    Reaction unknown   Penicillins Swelling and Other (See Comments)    Facial swelling; can tolerate cephalosporins     Allergies as of 12/09/2022       Reactions   Ace Inhibitors Other (See Comments)   Reaction unknown   Flagyl [metronidazole] Other (See Comments)   Reaction unknown   Penicillins Swelling, Other (See Comments)   Facial swelling; can tolerate cephalosporins        Medication List        Accurate as of December 09, 2022  4:33 PM. If you have any questions, ask your nurse or doctor.  acetaminophen 325 MG tablet Commonly known as: TYLENOL Take 2 tablets (650 mg total) by mouth every 6 (six) hours as needed for mild pain, fever or headache.   amLODipine 5 MG tablet Commonly known as: NORVASC Take 1 tablet (5 mg total) by mouth daily.   aspirin EC 81 MG tablet Take 81 mg by mouth daily. Monday, Wednesday and Friday   betamethasone dipropionate 0.05 % cream Apply topically.   cholecalciferol 25 MCG (1000 UNIT) tablet Commonly known as: VITAMIN D3 Take 4,000 Units by mouth once a week.   doxycycline 50 MG capsule Commonly known as: VIBRAMYCIN Take 50 mg by mouth 2 (two) times daily.   ferrous sulfate 325 (65 FE) MG tablet Take 325 mg by mouth daily with breakfast.   furosemide 20 MG tablet Commonly known as: LASIX Take 1 tablet (20 mg total) by mouth daily. What changed: Another medication  with the same name was added. Make sure you understand how and when to take each. Changed by: Amarisa Wilinski X Ned Kakar, NP   furosemide 20 MG tablet Commonly known as: LASIX Take 2 tablets (40 mg total) by mouth daily. What changed: You were already taking a medication with the same name, and this prescription was added. Make sure you understand how and when to take each. Changed by: Severina Sykora X Kristianna Saperstein, NP   ICAPS AREDS 2 PO Take 1 capsule by mouth daily with breakfast.   Myrbetriq 25 MG Tb24 tablet Generic drug: mirabegron ER TAKE 2 TABLETS BY MOUTH ONCE  DAILY   pantoprazole 40 MG tablet Commonly known as: PROTONIX Take 1 tablet (40 mg total) by mouth daily.   predniSONE 10 MG tablet Commonly known as: DELTASONE Take 10 mg by mouth 2 (two) times daily.   vitamin C 250 MG tablet Commonly known as: ASCORBIC ACID Take 1 tablet (250 mg total) by mouth daily.        Review of Systems:  Review of Systems  Constitutional:  Negative for appetite change, fatigue and fever.  HENT:  Positive for hearing loss. Negative for congestion and sore throat.   Eyes:  Negative for visual disturbance.  Respiratory:  Positive for cough. Negative for shortness of breath and wheezing.        Occasional cough, scant greenish phlegm production.   Cardiovascular:  Positive for leg swelling. Negative for chest pain.  Gastrointestinal:  Negative for abdominal pain and constipation.       Fecal incontinent.   Genitourinary:  Positive for frequency. Negative for dysuria and urgency.       Occasionally incontinent of urine. Urination average every 4 hrs during day 1x/night.   Musculoskeletal:  Positive for arthralgias and gait problem.       Right lower leg/ankle brace  Skin:  Negative for color change.  Neurological:  Negative for speech difficulty, weakness and light-headedness.       Memory lapses.   Psychiatric/Behavioral:  Negative for behavioral problems and sleep disturbance. The patient is not nervous/anxious.      Health Maintenance  Topic Date Due   Zoster Vaccines- Shingrix (2 of 2) 10/22/2022   COVID-19 Vaccine (6 - 2023-24 season) 12/25/2022 (Originally 02/12/2022)   INFLUENZA VACCINE  01/13/2023   Medicare Annual Wellness (AWV)  07/02/2023   DTaP/Tdap/Td (2 - Td or Tdap) 11/26/2029   Pneumonia Vaccine 47+ Years old  Completed   DEXA SCAN  Completed   HPV VACCINES  Aged Out    Physical Exam: Vitals:   12/09/22 1444  BP: 130/82  Pulse: 86  Resp: 18  Temp: (!) 97.5 F (36.4 C)  SpO2: 95%  Weight: 132 lb (59.9 kg)  Height: 5' 5.5" (1.664 m)   Body mass index is 21.63 kg/m. Physical Exam Vitals reviewed.  Constitutional:      Appearance: Normal appearance.  HENT:     Head: Normocephalic and atraumatic.     Nose: Nose normal.     Mouth/Throat:     Mouth: Mucous membranes are moist.  Eyes:     Extraocular Movements: Extraocular movements intact.     Conjunctiva/sclera: Conjunctivae normal.     Pupils: Pupils are equal, round, and reactive to light.  Cardiovascular:     Rate and Rhythm: Normal rate and regular rhythm.     Heart sounds: No murmur heard. Pulmonary:     Effort: Pulmonary effort is normal.     Breath sounds: Rales present. No wheezing or rhonchi.     Comments: Bibasilar rales.  Abdominal:     General: Bowel sounds are normal.     Palpations: Abdomen is soft.     Tenderness: There is no abdominal tenderness.  Musculoskeletal:        General: No tenderness.     Cervical back: Normal range of motion and neck supple.     Right lower leg: Edema present.     Left lower leg: Edema present.     Comments: R ankle s/p ORIF 01/13/21. Edema BLE trace. THR 11/2021  Skin:    General: Skin is warm and dry.     Comments: Chronic pigmented venous insufficiency skin changes BLE. Hematoma posterior right knee is near healed.   Neurological:     Mental Status: She is alert. Mental status is at baseline.     Gait: Gait abnormal.     Comments: Oriented to person, place.    Psychiatric:        Mood and Affect: Mood normal.        Behavior: Behavior normal.        Thought Content: Thought content normal.     Labs reviewed: Basic Metabolic Panel: Recent Labs    05/03/22 0000 08/30/22 0000  NA 138 137  K 4.0 4.9  CL 102 99  CO2 25* 29*  BUN 38* 35*  CREATININE 1.5* 1.7*  CALCIUM 10.2 10.1   Liver Function Tests: Recent Labs    05/03/22 0000 08/30/22 0000  ALBUMIN 4.5 4.2   No results for input(s): "LIPASE", "AMYLASE" in the last 8760 hours. No results for input(s): "AMMONIA" in the last 8760 hours. CBC: Recent Labs    05/03/22 0000 08/30/22 0000  HGB 10.0* 10.5*   Lipid Panel: No results for input(s): "CHOL", "HDL", "LDLCALC", "TRIG", "CHOLHDL", "LDLDIRECT" in the last 8760 hours. No results found for: "HGBA1C"  Procedures since last visit: No results found.  Assessment/Plan  Anemia chronic, acute blood loss after surgery, baseline Hgb 9s,  on Pantoprazole for GI protection, FOBT negative, Iron 112 08/26/21, Vit B12 480, EPO 9.9 09/15/21, On Fe, Hgb 10.5 08/30/22  Pemphigoid  legs, responded to steroid tx in the past, flare up since Dermatology dc Prednisone, resumed yesterday 12/08/22 along with Doxy 50mg  bid ?2 weeks.   Venous insufficiency (chronic) (peripheral)  treated with Doxy 11/05/22 x 7 days LLE cellulitis with Furosemide increased to 40mg  every day x 3 days, at baseline, taking Furosemide 20mg  every day.  Will increase Furosemide 40mg  every day x 3 days.   CKD (chronic kidney disease) stage 4, GFR 15-29 ml/min (  HCC)  Bun/creat  35/1.7 08/30/22, saw nephrology  Osteoarthritis, multiple sites  Hx of R ankle fx, R hip total hip arthroplasty 11/30/21, ambulates with walker.   Incontinent of urine uses pads, it seems better since taking Myrbetriq in pm per Urology  Hypertension blood pressure is controlled, on Amlodipine 5mg  qd started 07/07/21 by Nephrology.   Osteoporosis off Alendronate due to CKD, 11/05/20 DEXA t score  -2.8, desires delaying DEXA   Labs/tests ordered:  none  Appt 02/2023  Next appt:  03/03/2023

## 2022-12-09 NOTE — Assessment & Plan Note (Signed)
Bun/creat  35/1.7 08/30/22, saw nephrology 

## 2022-12-09 NOTE — Assessment & Plan Note (Addendum)
legs, responded to steroid tx in the past, flare up since Dermatology dc Prednisone, resumed yesterday 12/08/22 along with Doxy 50mg  bid ?2 weeks.

## 2022-12-09 NOTE — Assessment & Plan Note (Addendum)
treated with Doxy 11/05/22 x 7 days LLE cellulitis with Furosemide increased to 40mg  every day x 3 days, at baseline, taking Furosemide 20mg  every day.  Will increase Furosemide 40mg  every day x 3 days.

## 2022-12-09 NOTE — Assessment & Plan Note (Signed)
chronic, acute blood loss after surgery, baseline Hgb 9s,  on Pantoprazole for GI protection, FOBT negative, Iron 112 08/26/21, Vit B12 480, EPO 9.9 09/15/21, On Fe, Hgb 10.5 08/30/22 

## 2022-12-09 NOTE — Assessment & Plan Note (Signed)
blood pressure is controlled, on Amlodipine 5mg qd started 07/07/21 by Nephrology.  

## 2022-12-09 NOTE — Assessment & Plan Note (Signed)
off Alendronate due to CKD, 11/05/20 DEXA t score -2.8, desires delaying DEXA 

## 2022-12-10 DIAGNOSIS — R2681 Unsteadiness on feet: Secondary | ICD-10-CM | POA: Diagnosis not present

## 2022-12-10 DIAGNOSIS — R41841 Cognitive communication deficit: Secondary | ICD-10-CM | POA: Diagnosis not present

## 2022-12-10 DIAGNOSIS — M6281 Muscle weakness (generalized): Secondary | ICD-10-CM | POA: Diagnosis not present

## 2022-12-10 DIAGNOSIS — N3946 Mixed incontinence: Secondary | ICD-10-CM | POA: Diagnosis not present

## 2022-12-12 DIAGNOSIS — R2681 Unsteadiness on feet: Secondary | ICD-10-CM | POA: Diagnosis not present

## 2022-12-12 DIAGNOSIS — N3946 Mixed incontinence: Secondary | ICD-10-CM | POA: Diagnosis not present

## 2022-12-12 DIAGNOSIS — R41841 Cognitive communication deficit: Secondary | ICD-10-CM | POA: Diagnosis not present

## 2022-12-12 DIAGNOSIS — M6281 Muscle weakness (generalized): Secondary | ICD-10-CM | POA: Diagnosis not present

## 2022-12-13 DIAGNOSIS — R41841 Cognitive communication deficit: Secondary | ICD-10-CM | POA: Diagnosis not present

## 2022-12-13 DIAGNOSIS — M6281 Muscle weakness (generalized): Secondary | ICD-10-CM | POA: Diagnosis not present

## 2022-12-13 DIAGNOSIS — N3946 Mixed incontinence: Secondary | ICD-10-CM | POA: Diagnosis not present

## 2022-12-13 DIAGNOSIS — R2681 Unsteadiness on feet: Secondary | ICD-10-CM | POA: Diagnosis not present

## 2022-12-15 ENCOUNTER — Other Ambulatory Visit: Payer: Self-pay | Admitting: Nurse Practitioner

## 2022-12-15 DIAGNOSIS — M6281 Muscle weakness (generalized): Secondary | ICD-10-CM | POA: Diagnosis not present

## 2022-12-15 DIAGNOSIS — N3946 Mixed incontinence: Secondary | ICD-10-CM | POA: Diagnosis not present

## 2022-12-15 DIAGNOSIS — R41841 Cognitive communication deficit: Secondary | ICD-10-CM | POA: Diagnosis not present

## 2022-12-15 DIAGNOSIS — R2681 Unsteadiness on feet: Secondary | ICD-10-CM | POA: Diagnosis not present

## 2022-12-17 DIAGNOSIS — N3946 Mixed incontinence: Secondary | ICD-10-CM | POA: Diagnosis not present

## 2022-12-17 DIAGNOSIS — R41841 Cognitive communication deficit: Secondary | ICD-10-CM | POA: Diagnosis not present

## 2022-12-17 DIAGNOSIS — M6281 Muscle weakness (generalized): Secondary | ICD-10-CM | POA: Diagnosis not present

## 2022-12-17 DIAGNOSIS — R2681 Unsteadiness on feet: Secondary | ICD-10-CM | POA: Diagnosis not present

## 2022-12-20 DIAGNOSIS — N3946 Mixed incontinence: Secondary | ICD-10-CM | POA: Diagnosis not present

## 2022-12-20 DIAGNOSIS — R2681 Unsteadiness on feet: Secondary | ICD-10-CM | POA: Diagnosis not present

## 2022-12-20 DIAGNOSIS — R41841 Cognitive communication deficit: Secondary | ICD-10-CM | POA: Diagnosis not present

## 2022-12-20 DIAGNOSIS — M6281 Muscle weakness (generalized): Secondary | ICD-10-CM | POA: Diagnosis not present

## 2022-12-21 DIAGNOSIS — R41841 Cognitive communication deficit: Secondary | ICD-10-CM | POA: Diagnosis not present

## 2022-12-21 DIAGNOSIS — R2681 Unsteadiness on feet: Secondary | ICD-10-CM | POA: Diagnosis not present

## 2022-12-21 DIAGNOSIS — N3946 Mixed incontinence: Secondary | ICD-10-CM | POA: Diagnosis not present

## 2022-12-21 DIAGNOSIS — M6281 Muscle weakness (generalized): Secondary | ICD-10-CM | POA: Diagnosis not present

## 2022-12-22 DIAGNOSIS — N3946 Mixed incontinence: Secondary | ICD-10-CM | POA: Diagnosis not present

## 2022-12-22 DIAGNOSIS — M6281 Muscle weakness (generalized): Secondary | ICD-10-CM | POA: Diagnosis not present

## 2022-12-22 DIAGNOSIS — R2681 Unsteadiness on feet: Secondary | ICD-10-CM | POA: Diagnosis not present

## 2022-12-22 DIAGNOSIS — R41841 Cognitive communication deficit: Secondary | ICD-10-CM | POA: Diagnosis not present

## 2022-12-23 DIAGNOSIS — R2681 Unsteadiness on feet: Secondary | ICD-10-CM | POA: Diagnosis not present

## 2022-12-23 DIAGNOSIS — N3946 Mixed incontinence: Secondary | ICD-10-CM | POA: Diagnosis not present

## 2022-12-23 DIAGNOSIS — M6281 Muscle weakness (generalized): Secondary | ICD-10-CM | POA: Diagnosis not present

## 2022-12-23 DIAGNOSIS — R41841 Cognitive communication deficit: Secondary | ICD-10-CM | POA: Diagnosis not present

## 2022-12-24 DIAGNOSIS — D692 Other nonthrombocytopenic purpura: Secondary | ICD-10-CM | POA: Diagnosis not present

## 2022-12-24 DIAGNOSIS — L120 Bullous pemphigoid: Secondary | ICD-10-CM | POA: Diagnosis not present

## 2022-12-24 DIAGNOSIS — N3946 Mixed incontinence: Secondary | ICD-10-CM | POA: Diagnosis not present

## 2022-12-24 DIAGNOSIS — R2681 Unsteadiness on feet: Secondary | ICD-10-CM | POA: Diagnosis not present

## 2022-12-24 DIAGNOSIS — R41841 Cognitive communication deficit: Secondary | ICD-10-CM | POA: Diagnosis not present

## 2022-12-24 DIAGNOSIS — M6281 Muscle weakness (generalized): Secondary | ICD-10-CM | POA: Diagnosis not present

## 2022-12-24 DIAGNOSIS — I878 Other specified disorders of veins: Secondary | ICD-10-CM | POA: Diagnosis not present

## 2022-12-24 DIAGNOSIS — T1490XD Injury, unspecified, subsequent encounter: Secondary | ICD-10-CM | POA: Diagnosis not present

## 2022-12-27 DIAGNOSIS — R41841 Cognitive communication deficit: Secondary | ICD-10-CM | POA: Diagnosis not present

## 2022-12-27 DIAGNOSIS — R2681 Unsteadiness on feet: Secondary | ICD-10-CM | POA: Diagnosis not present

## 2022-12-27 DIAGNOSIS — M6281 Muscle weakness (generalized): Secondary | ICD-10-CM | POA: Diagnosis not present

## 2022-12-27 DIAGNOSIS — N3946 Mixed incontinence: Secondary | ICD-10-CM | POA: Diagnosis not present

## 2022-12-28 DIAGNOSIS — R2681 Unsteadiness on feet: Secondary | ICD-10-CM | POA: Diagnosis not present

## 2022-12-28 DIAGNOSIS — N3946 Mixed incontinence: Secondary | ICD-10-CM | POA: Diagnosis not present

## 2022-12-28 DIAGNOSIS — M6281 Muscle weakness (generalized): Secondary | ICD-10-CM | POA: Diagnosis not present

## 2022-12-28 DIAGNOSIS — R41841 Cognitive communication deficit: Secondary | ICD-10-CM | POA: Diagnosis not present

## 2022-12-29 ENCOUNTER — Non-Acute Institutional Stay (INDEPENDENT_AMBULATORY_CARE_PROVIDER_SITE_OTHER): Payer: Medicare Other | Admitting: Family Medicine

## 2022-12-29 ENCOUNTER — Encounter: Payer: Self-pay | Admitting: Family Medicine

## 2022-12-29 VITALS — BP 140/90 | HR 74 | Temp 97.9°F | Resp 20 | Ht 65.5 in | Wt 130.0 lb

## 2022-12-29 DIAGNOSIS — R238 Other skin changes: Secondary | ICD-10-CM | POA: Diagnosis not present

## 2022-12-29 DIAGNOSIS — R41841 Cognitive communication deficit: Secondary | ICD-10-CM | POA: Diagnosis not present

## 2022-12-29 DIAGNOSIS — M6281 Muscle weakness (generalized): Secondary | ICD-10-CM | POA: Diagnosis not present

## 2022-12-29 DIAGNOSIS — L129 Pemphigoid, unspecified: Secondary | ICD-10-CM | POA: Diagnosis not present

## 2022-12-29 DIAGNOSIS — R600 Localized edema: Secondary | ICD-10-CM | POA: Diagnosis not present

## 2022-12-29 DIAGNOSIS — R2681 Unsteadiness on feet: Secondary | ICD-10-CM | POA: Diagnosis not present

## 2022-12-29 DIAGNOSIS — D649 Anemia, unspecified: Secondary | ICD-10-CM

## 2022-12-29 DIAGNOSIS — N3946 Mixed incontinence: Secondary | ICD-10-CM | POA: Diagnosis not present

## 2022-12-29 MED ORDER — METOLAZONE 2.5 MG PO TABS
2.5000 mg | ORAL_TABLET | Freq: Every day | ORAL | Status: DC
Start: 2022-12-29 — End: 2023-01-01

## 2022-12-29 MED ORDER — MUPIROCIN CALCIUM 2 % EX CREA
TOPICAL_CREAM | Freq: Two times a day (BID) | CUTANEOUS | Status: DC
Start: 2022-12-29 — End: 2023-01-01

## 2022-12-29 NOTE — Progress Notes (Signed)
Provider:  Jacalyn Lefevre, MD  Careteam: Patient Care Team: Mast, Man X, NP as PCP - General (Internal Medicine)  PLACE OF SERVICE:  [facility]  Advanced Directive information    Allergies  Allergen Reactions   Ace Inhibitors Other (See Comments)    Reaction unknown   Flagyl [Metronidazole] Other (See Comments)    Reaction unknown   Penicillins Swelling and Other (See Comments)    Facial swelling; can tolerate cephalosporins     Chief Complaint  Patient presents with   Acute Visit    Patient is being seen for sores on BLE     HPI: Patient is a 89 y.o. female .  Patient has history of bullous pemphigus as well as lymphedema from venous insufficiency.  She has chronic dependent edema and takes furosemide.  Currently she is finishing up courses of both prednisone and doxycycline for the bullous skin disease as well as soft tissue infection.  It was recommended that she come today for an exam to determine need for change in antibiotics.  Review of Systems:  Review of Systems  Cardiovascular:  Positive for leg swelling.  Skin:        Multiple leg wounds secondary to bullous pemphigus     Past Medical History:  Diagnosis Date   Anemia    Chronic kidney disease    stage 3   GERD (gastroesophageal reflux disease)    Hypertension    Past Surgical History:  Procedure Laterality Date   ORIF ANKLE FRACTURE Right 01/13/2021   Procedure: OPEN TREATMENT OF RIGHT TRIMALLEOLAR ANKLE FRACTURE WITHOUT POSTERIOR FIXATION, POSSIBLE SYNDESMOSIS;  Surgeon: Terance Hart, MD;  Location: [facility];  Service: Orthopedics;  Laterality: Right;   TOTAL HIP ARTHROPLASTY Right 12/02/2021   Procedure: TOTAL HIP ARTHROPLASTY;  Surgeon: Joen Laura, MD;  Location: [facility];  Service: Orthopedics;  Laterality: Right;   Social History:   reports that she quit smoking about 65 years ago. Her smoking use included cigarettes. She started smoking about 77 years ago. She has never used  smokeless tobacco. She reports that she does not currently use alcohol. She reports that she does not use drugs.  Family History  Problem Relation Age of Onset   Heart failure Mother    Heart disease Father    Arthritis Sister     Medications: Patient's Medications  New Prescriptions   No medications on file  Previous Medications   ACETAMINOPHEN (TYLENOL) 325 MG TABLET    Take 2 tablets (650 mg total) by mouth every 6 (six) hours as needed for mild pain, fever or headache.   AMLODIPINE (NORVASC) 5 MG TABLET    Take 1 tablet (5 mg total) by mouth daily.   ASPIRIN EC 81 MG TABLET    Take 81 mg by mouth daily. Monday, Wednesday and Friday   BETAMETHASONE DIPROPIONATE 0.05 % CREAM    Apply topically.   CHOLECALCIFEROL (VITAMIN D3) 25 MCG (1000 UNIT) TABLET    Take 4,000 Units by mouth once a week.   DOXYCYCLINE (VIBRAMYCIN) 50 MG CAPSULE    Take 50 mg by mouth 2 (two) times daily.   FERROUS SULFATE (FEROSUL) 325 (65 FE) MG TABLET    Take 1 tablet (325 mg total) by mouth daily with breakfast. MON, WED, FRI   FUROSEMIDE (LASIX) 20 MG TABLET    Take 1 tablet (20 mg total) by mouth daily.   FUROSEMIDE (LASIX) 20 MG TABLET    Take 2 tablets (40 mg  total) by mouth daily.   MULTIPLE VITAMINS-MINERALS (ICAPS AREDS 2 PO)    Take 1 capsule by mouth daily with breakfast.   MYRBETRIQ 25 MG TB24 TABLET    TAKE 2 TABLETS BY MOUTH ONCE  DAILY   PANTOPRAZOLE (PROTONIX) 40 MG TABLET    Take 1 tablet (40 mg total) by mouth daily.   PREDNISONE (DELTASONE) 10 MG TABLET    Take 10 mg by mouth 2 (two) times daily.   VITAMIN C (ASCORBIC ACID) 250 MG TABLET    Take 1 tablet (250 mg total) by mouth daily.  Modified Medications   No medications on file  Discontinued Medications   No medications on file    Physical Exam:  Vitals:   12/29/22 1303  Height: 5' 5.5" (1.664 m)   Body mass index is 21.63 kg/m. Wt Readings from Last 3 Encounters:  12/09/22 132 lb (59.9 kg)  11/05/22 132 lb 6.4 oz (60.1 kg)   10/07/22 125 lb 12.8 oz (57.1 kg)    Physical Exam Vitals and nursing note reviewed.  Constitutional:      Appearance: Normal appearance.  Cardiovascular:     Rate and Rhythm: Normal rate and regular rhythm.  Pulmonary:     Effort: Pulmonary effort is normal.  Skin:    Comments: Lesions on right lower leg have a coagulum in the middle no surrounding erythema.  I believe this is part of the normal healing process but I do not see any cellulitis or need for change in her doxycycline  Neurological:     General: No focal deficit present.     Mental Status: She is alert and oriented to person, place, and time.     Labs reviewed: Basic Metabolic Panel: Recent Labs    05/03/22 0000 08/30/22 0000  NA 138 137  K 4.0 4.9  CL 102 99  CO2 25* 29*  BUN 38* 35*  CREATININE 1.5* 1.7*  CALCIUM 10.2 10.1   Liver Function Tests: Recent Labs    05/03/22 0000 08/30/22 0000  ALBUMIN 4.5 4.2   No results for input(s): "LIPASE", "AMYLASE" in the last 8760 hours. No results for input(s): "AMMONIA" in the last 8760 hours. CBC: Recent Labs    05/03/22 0000 08/30/22 0000  HGB 10.0* 10.5*   Lipid Panel: No results for input(s): "CHOL", "HDL", "LDLCALC", "TRIG", "CHOLHDL", "LDLDIRECT" in the last 8760 hours. TSH: No results for input(s): "TSH" in the last 8760 hours. A1C: No results found for: "HGBA1C"   Assessment/Pl   2. Bilateral leg edema Continue with furosemide but add 2.5 metolazone every other day for 1 to 2 weeks  3. Blisters of multiple sites Steroids have mostly healed on prednisone continue current regimen.  Have added topical mupirocin to doxycycline   Jacalyn Lefevre, MD [facility] [phone]

## 2022-12-30 DIAGNOSIS — D649 Anemia, unspecified: Secondary | ICD-10-CM | POA: Diagnosis not present

## 2022-12-30 LAB — CBC
HCT: 30.4 % — ABNORMAL LOW (ref 35.0–45.0)
Hemoglobin: 10 g/dL — ABNORMAL LOW (ref 11.7–15.5)
MCH: 32.4 pg (ref 27.0–33.0)
MCHC: 32.9 g/dL (ref 32.0–36.0)
MCV: 98.4 fL (ref 80.0–100.0)
MPV: 11.2 fL (ref 7.5–12.5)
Platelets: 201 10*3/uL (ref 140–400)
RBC: 3.09 10*6/uL — ABNORMAL LOW (ref 3.80–5.10)
RDW: 12.6 % (ref 11.0–15.0)
WBC: 8.5 10*3/uL (ref 3.8–10.8)

## 2022-12-31 DIAGNOSIS — N3946 Mixed incontinence: Secondary | ICD-10-CM | POA: Diagnosis not present

## 2022-12-31 DIAGNOSIS — R41841 Cognitive communication deficit: Secondary | ICD-10-CM | POA: Diagnosis not present

## 2022-12-31 DIAGNOSIS — M6281 Muscle weakness (generalized): Secondary | ICD-10-CM | POA: Diagnosis not present

## 2022-12-31 DIAGNOSIS — R2681 Unsteadiness on feet: Secondary | ICD-10-CM | POA: Diagnosis not present

## 2023-01-01 ENCOUNTER — Telehealth: Payer: Self-pay | Admitting: Adult Health

## 2023-01-01 ENCOUNTER — Other Ambulatory Visit: Payer: Self-pay | Admitting: Adult Health

## 2023-01-01 DIAGNOSIS — M6281 Muscle weakness (generalized): Secondary | ICD-10-CM | POA: Diagnosis not present

## 2023-01-01 DIAGNOSIS — R2681 Unsteadiness on feet: Secondary | ICD-10-CM | POA: Diagnosis not present

## 2023-01-01 DIAGNOSIS — R41841 Cognitive communication deficit: Secondary | ICD-10-CM | POA: Diagnosis not present

## 2023-01-01 DIAGNOSIS — N3946 Mixed incontinence: Secondary | ICD-10-CM | POA: Diagnosis not present

## 2023-01-01 MED ORDER — MUPIROCIN 2 % EX OINT: 1.0000 | TOPICAL_OINTMENT | Freq: Two times a day (BID) | CUTANEOUS | 0 refills | Status: DC

## 2023-01-01 MED ORDER — METOLAZONE 2.5 MG PO TABS: 2.5000 mg | ORAL_TABLET | ORAL | 1 refills | Status: DC

## 2023-01-01 MED ORDER — METOLAZONE 2.5 MG PO TABS: 2.5000 mg | ORAL_TABLET | Freq: Every day | ORAL | 1 refills | Status: DC

## 2023-01-01 NOTE — Progress Notes (Signed)
Attempted to call pt's daughter and let her know meds were called in. No answer and the voicemail did not allow messages to be left.

## 2023-01-01 NOTE — Telephone Encounter (Signed)
Pt daughter called, meds not received to [facility]. Meds resent.

## 2023-01-03 DIAGNOSIS — R41841 Cognitive communication deficit: Secondary | ICD-10-CM | POA: Diagnosis not present

## 2023-01-03 DIAGNOSIS — R2681 Unsteadiness on feet: Secondary | ICD-10-CM | POA: Diagnosis not present

## 2023-01-03 DIAGNOSIS — N3946 Mixed incontinence: Secondary | ICD-10-CM | POA: Diagnosis not present

## 2023-01-03 DIAGNOSIS — M6281 Muscle weakness (generalized): Secondary | ICD-10-CM | POA: Diagnosis not present

## 2023-01-03 NOTE — Telephone Encounter (Signed)
Patient daughter, Rayfield Citizen, called and left message on Clinical Intake stating that she needed patient's medications sent to the pharmacy that Dr. Hyacinth Meeker ordered.   Rx's were sent over again by [facility] on 7/20 with Confirmation.   Tried calling daughter, LMOM to return call.

## 2023-01-05 DIAGNOSIS — R41841 Cognitive communication deficit: Secondary | ICD-10-CM | POA: Diagnosis not present

## 2023-01-05 DIAGNOSIS — N3946 Mixed incontinence: Secondary | ICD-10-CM | POA: Diagnosis not present

## 2023-01-05 DIAGNOSIS — R2681 Unsteadiness on feet: Secondary | ICD-10-CM | POA: Diagnosis not present

## 2023-01-05 DIAGNOSIS — M6281 Muscle weakness (generalized): Secondary | ICD-10-CM | POA: Diagnosis not present

## 2023-01-06 DIAGNOSIS — N3946 Mixed incontinence: Secondary | ICD-10-CM | POA: Diagnosis not present

## 2023-01-06 DIAGNOSIS — R2681 Unsteadiness on feet: Secondary | ICD-10-CM | POA: Diagnosis not present

## 2023-01-06 DIAGNOSIS — M6281 Muscle weakness (generalized): Secondary | ICD-10-CM | POA: Diagnosis not present

## 2023-01-06 DIAGNOSIS — R41841 Cognitive communication deficit: Secondary | ICD-10-CM | POA: Diagnosis not present

## 2023-01-07 DIAGNOSIS — N3946 Mixed incontinence: Secondary | ICD-10-CM | POA: Diagnosis not present

## 2023-01-07 DIAGNOSIS — M6281 Muscle weakness (generalized): Secondary | ICD-10-CM | POA: Diagnosis not present

## 2023-01-07 DIAGNOSIS — R41841 Cognitive communication deficit: Secondary | ICD-10-CM | POA: Diagnosis not present

## 2023-01-07 DIAGNOSIS — R2681 Unsteadiness on feet: Secondary | ICD-10-CM | POA: Diagnosis not present

## 2023-01-10 DIAGNOSIS — N3946 Mixed incontinence: Secondary | ICD-10-CM | POA: Diagnosis not present

## 2023-01-10 DIAGNOSIS — R2681 Unsteadiness on feet: Secondary | ICD-10-CM | POA: Diagnosis not present

## 2023-01-10 DIAGNOSIS — R41841 Cognitive communication deficit: Secondary | ICD-10-CM | POA: Diagnosis not present

## 2023-01-10 DIAGNOSIS — M6281 Muscle weakness (generalized): Secondary | ICD-10-CM | POA: Diagnosis not present

## 2023-01-11 DIAGNOSIS — M6281 Muscle weakness (generalized): Secondary | ICD-10-CM | POA: Diagnosis not present

## 2023-01-11 DIAGNOSIS — N3946 Mixed incontinence: Secondary | ICD-10-CM | POA: Diagnosis not present

## 2023-01-11 DIAGNOSIS — R41841 Cognitive communication deficit: Secondary | ICD-10-CM | POA: Diagnosis not present

## 2023-01-11 DIAGNOSIS — R2681 Unsteadiness on feet: Secondary | ICD-10-CM | POA: Diagnosis not present

## 2023-01-12 DIAGNOSIS — R41841 Cognitive communication deficit: Secondary | ICD-10-CM | POA: Diagnosis not present

## 2023-01-12 DIAGNOSIS — R2681 Unsteadiness on feet: Secondary | ICD-10-CM | POA: Diagnosis not present

## 2023-01-12 DIAGNOSIS — N3946 Mixed incontinence: Secondary | ICD-10-CM | POA: Diagnosis not present

## 2023-01-12 DIAGNOSIS — M6281 Muscle weakness (generalized): Secondary | ICD-10-CM | POA: Diagnosis not present

## 2023-01-13 DIAGNOSIS — M6281 Muscle weakness (generalized): Secondary | ICD-10-CM | POA: Diagnosis not present

## 2023-01-13 DIAGNOSIS — R41841 Cognitive communication deficit: Secondary | ICD-10-CM | POA: Diagnosis not present

## 2023-01-13 DIAGNOSIS — N3946 Mixed incontinence: Secondary | ICD-10-CM | POA: Diagnosis not present

## 2023-01-13 DIAGNOSIS — R2681 Unsteadiness on feet: Secondary | ICD-10-CM | POA: Diagnosis not present

## 2023-01-14 DIAGNOSIS — N3946 Mixed incontinence: Secondary | ICD-10-CM | POA: Diagnosis not present

## 2023-01-14 DIAGNOSIS — M6281 Muscle weakness (generalized): Secondary | ICD-10-CM | POA: Diagnosis not present

## 2023-01-14 DIAGNOSIS — R2681 Unsteadiness on feet: Secondary | ICD-10-CM | POA: Diagnosis not present

## 2023-01-14 DIAGNOSIS — R41841 Cognitive communication deficit: Secondary | ICD-10-CM | POA: Diagnosis not present

## 2023-01-16 ENCOUNTER — Emergency Department (HOSPITAL_BASED_OUTPATIENT_CLINIC_OR_DEPARTMENT_OTHER): Payer: Medicare Other

## 2023-01-16 ENCOUNTER — Other Ambulatory Visit: Payer: Self-pay

## 2023-01-16 ENCOUNTER — Inpatient Hospital Stay (HOSPITAL_BASED_OUTPATIENT_CLINIC_OR_DEPARTMENT_OTHER)
Admission: EM | Admit: 2023-01-16 | Discharge: 2023-01-19 | DRG: 682 | Disposition: A | Discharge status: Skilled Nursing Facility | Payer: Medicare Other | Source: Skilled Nursing Facility | Attending: Internal Medicine | Admitting: Internal Medicine

## 2023-01-16 ENCOUNTER — Encounter (HOSPITAL_BASED_OUTPATIENT_CLINIC_OR_DEPARTMENT_OTHER): Payer: Self-pay

## 2023-01-16 DIAGNOSIS — I4819 Other persistent atrial fibrillation: Secondary | ICD-10-CM

## 2023-01-16 DIAGNOSIS — N184 Chronic kidney disease, stage 4 (severe): Secondary | ICD-10-CM | POA: Diagnosis not present

## 2023-01-16 DIAGNOSIS — R531 Weakness: Secondary | ICD-10-CM | POA: Diagnosis not present

## 2023-01-16 DIAGNOSIS — I872 Venous insufficiency (chronic) (peripheral): Secondary | ICD-10-CM | POA: Diagnosis present

## 2023-01-16 DIAGNOSIS — I5022 Chronic systolic (congestive) heart failure: Secondary | ICD-10-CM | POA: Diagnosis not present

## 2023-01-16 DIAGNOSIS — U071 COVID-19: Secondary | ICD-10-CM | POA: Diagnosis present

## 2023-01-16 DIAGNOSIS — I4892 Unspecified atrial flutter: Secondary | ICD-10-CM | POA: Diagnosis not present

## 2023-01-16 DIAGNOSIS — R0902 Hypoxemia: Secondary | ICD-10-CM

## 2023-01-16 DIAGNOSIS — I4891 Unspecified atrial fibrillation: Secondary | ICD-10-CM

## 2023-01-16 DIAGNOSIS — M6281 Muscle weakness (generalized): Secondary | ICD-10-CM | POA: Diagnosis not present

## 2023-01-16 DIAGNOSIS — S51812A Laceration without foreign body of left forearm, initial encounter: Secondary | ICD-10-CM | POA: Diagnosis present

## 2023-01-16 DIAGNOSIS — Z87891 Personal history of nicotine dependence: Secondary | ICD-10-CM

## 2023-01-16 DIAGNOSIS — E860 Dehydration: Secondary | ICD-10-CM | POA: Diagnosis not present

## 2023-01-16 DIAGNOSIS — D649 Anemia, unspecified: Secondary | ICD-10-CM | POA: Diagnosis not present

## 2023-01-16 DIAGNOSIS — R519 Headache, unspecified: Secondary | ICD-10-CM | POA: Diagnosis not present

## 2023-01-16 DIAGNOSIS — I10 Essential (primary) hypertension: Secondary | ICD-10-CM | POA: Diagnosis not present

## 2023-01-16 DIAGNOSIS — I2489 Other forms of acute ischemic heart disease: Secondary | ICD-10-CM | POA: Diagnosis present

## 2023-01-16 DIAGNOSIS — Z888 Allergy status to other drugs, medicaments and biological substances status: Secondary | ICD-10-CM | POA: Diagnosis not present

## 2023-01-16 DIAGNOSIS — I498 Other specified cardiac arrhythmias: Secondary | ICD-10-CM | POA: Diagnosis not present

## 2023-01-16 DIAGNOSIS — Z7952 Long term (current) use of systemic steroids: Secondary | ICD-10-CM

## 2023-01-16 DIAGNOSIS — N189 Chronic kidney disease, unspecified: Secondary | ICD-10-CM | POA: Diagnosis not present

## 2023-01-16 DIAGNOSIS — Y92099 Unspecified place in other non-institutional residence as the place of occurrence of the external cause: Secondary | ICD-10-CM

## 2023-01-16 DIAGNOSIS — R29898 Other symptoms and signs involving the musculoskeletal system: Secondary | ICD-10-CM | POA: Diagnosis not present

## 2023-01-16 DIAGNOSIS — E871 Hypo-osmolality and hyponatremia: Secondary | ICD-10-CM | POA: Diagnosis present

## 2023-01-16 DIAGNOSIS — I771 Stricture of artery: Secondary | ICD-10-CM | POA: Diagnosis not present

## 2023-01-16 DIAGNOSIS — R7989 Other specified abnormal findings of blood chemistry: Secondary | ICD-10-CM | POA: Diagnosis not present

## 2023-01-16 DIAGNOSIS — F0390 Unspecified dementia without behavioral disturbance: Secondary | ICD-10-CM | POA: Diagnosis present

## 2023-01-16 DIAGNOSIS — Z8249 Family history of ischemic heart disease and other diseases of the circulatory system: Secondary | ICD-10-CM

## 2023-01-16 DIAGNOSIS — W19XXXA Unspecified fall, initial encounter: Principal | ICD-10-CM

## 2023-01-16 DIAGNOSIS — Z7982 Long term (current) use of aspirin: Secondary | ICD-10-CM | POA: Diagnosis not present

## 2023-01-16 DIAGNOSIS — I509 Heart failure, unspecified: Secondary | ICD-10-CM | POA: Diagnosis not present

## 2023-01-16 DIAGNOSIS — D631 Anemia in chronic kidney disease: Secondary | ICD-10-CM | POA: Diagnosis not present

## 2023-01-16 DIAGNOSIS — R41841 Cognitive communication deficit: Secondary | ICD-10-CM | POA: Diagnosis not present

## 2023-01-16 DIAGNOSIS — Z79899 Other long term (current) drug therapy: Secondary | ICD-10-CM | POA: Diagnosis not present

## 2023-01-16 DIAGNOSIS — K219 Gastro-esophageal reflux disease without esophagitis: Secondary | ICD-10-CM | POA: Diagnosis not present

## 2023-01-16 DIAGNOSIS — N179 Acute kidney failure, unspecified: Secondary | ICD-10-CM | POA: Diagnosis not present

## 2023-01-16 DIAGNOSIS — Z96641 Presence of right artificial hip joint: Secondary | ICD-10-CM | POA: Diagnosis not present

## 2023-01-16 DIAGNOSIS — K449 Diaphragmatic hernia without obstruction or gangrene: Secondary | ICD-10-CM | POA: Diagnosis not present

## 2023-01-16 DIAGNOSIS — N1832 Chronic kidney disease, stage 3b: Secondary | ICD-10-CM | POA: Diagnosis not present

## 2023-01-16 DIAGNOSIS — I130 Hypertensive heart and chronic kidney disease with heart failure and stage 1 through stage 4 chronic kidney disease, or unspecified chronic kidney disease: Secondary | ICD-10-CM | POA: Diagnosis present

## 2023-01-16 DIAGNOSIS — I4821 Permanent atrial fibrillation: Secondary | ICD-10-CM | POA: Diagnosis not present

## 2023-01-16 DIAGNOSIS — I493 Ventricular premature depolarization: Secondary | ICD-10-CM | POA: Diagnosis not present

## 2023-01-16 DIAGNOSIS — R0781 Pleurodynia: Secondary | ICD-10-CM | POA: Diagnosis not present

## 2023-01-16 DIAGNOSIS — R079 Chest pain, unspecified: Secondary | ICD-10-CM | POA: Diagnosis not present

## 2023-01-16 DIAGNOSIS — M25552 Pain in left hip: Secondary | ICD-10-CM | POA: Diagnosis not present

## 2023-01-16 DIAGNOSIS — I5023 Acute on chronic systolic (congestive) heart failure: Secondary | ICD-10-CM | POA: Diagnosis not present

## 2023-01-16 DIAGNOSIS — Z880 Allergy status to penicillin: Secondary | ICD-10-CM

## 2023-01-16 DIAGNOSIS — Z7401 Bed confinement status: Secondary | ICD-10-CM | POA: Diagnosis not present

## 2023-01-16 DIAGNOSIS — L120 Bullous pemphigoid: Secondary | ICD-10-CM | POA: Diagnosis not present

## 2023-01-16 DIAGNOSIS — W06XXXA Fall from bed, initial encounter: Secondary | ICD-10-CM | POA: Diagnosis present

## 2023-01-16 DIAGNOSIS — R296 Repeated falls: Secondary | ICD-10-CM | POA: Diagnosis not present

## 2023-01-16 DIAGNOSIS — N3942 Incontinence without sensory awareness: Secondary | ICD-10-CM | POA: Diagnosis not present

## 2023-01-16 DIAGNOSIS — Z789 Other specified health status: Secondary | ICD-10-CM

## 2023-01-16 DIAGNOSIS — M25551 Pain in right hip: Secondary | ICD-10-CM | POA: Diagnosis not present

## 2023-01-16 DIAGNOSIS — M79632 Pain in left forearm: Secondary | ICD-10-CM | POA: Diagnosis not present

## 2023-01-16 DIAGNOSIS — R102 Pelvic and perineal pain: Secondary | ICD-10-CM | POA: Diagnosis not present

## 2023-01-16 DIAGNOSIS — I429 Cardiomyopathy, unspecified: Secondary | ICD-10-CM | POA: Diagnosis not present

## 2023-01-16 DIAGNOSIS — E876 Hypokalemia: Secondary | ICD-10-CM | POA: Diagnosis not present

## 2023-01-16 DIAGNOSIS — M159 Polyosteoarthritis, unspecified: Secondary | ICD-10-CM | POA: Diagnosis not present

## 2023-01-16 LAB — BASIC METABOLIC PANEL
Anion gap: 13 (ref 5–15)
BUN: 36 mg/dL — ABNORMAL HIGH (ref 8–23)
CO2: 30 mmol/L (ref 22–32)
Calcium: 9.1 mg/dL (ref 8.9–10.3)
Chloride: 87 mmol/L — ABNORMAL LOW (ref 98–111)
Creatinine, Ser: 1.95 mg/dL — ABNORMAL HIGH (ref 0.44–1.00)
GFR, Estimated: 24 mL/min — ABNORMAL LOW (ref >60)
Glucose, Bld: 106 mg/dL — ABNORMAL HIGH (ref 70–99)
Potassium: <2 mmol/L — CL (ref 3.5–5.1)
Sodium: 130 mmol/L — ABNORMAL LOW (ref 135–145)

## 2023-01-16 LAB — CBC WITH DIFFERENTIAL/PLATELET
Abs Immature Granulocytes: 0.05 10*3/uL (ref 0.00–0.07)
Basophils Absolute: 0.1 10*3/uL (ref 0.0–0.1)
Basophils Relative: 1 %
Eosinophils Absolute: 0.2 10*3/uL (ref 0.0–0.5)
Eosinophils Relative: 2 %
HCT: 31.8 % — ABNORMAL LOW (ref 36.0–46.0)
Hemoglobin: 10.9 g/dL — ABNORMAL LOW (ref 12.0–15.0)
Immature Granulocytes: 1 %
Lymphocytes Relative: 17 %
Lymphs Abs: 1.3 10*3/uL (ref 0.7–4.0)
MCH: 32.2 pg (ref 26.0–34.0)
MCHC: 34.3 g/dL (ref 30.0–36.0)
MCV: 93.8 fL (ref 80.0–100.0)
Monocytes Absolute: 1.5 10*3/uL — ABNORMAL HIGH (ref 0.1–1.0)
Monocytes Relative: 19 %
Neutro Abs: 4.9 10*3/uL (ref 1.7–7.7)
Neutrophils Relative %: 60 %
Platelets: 385 10*3/uL (ref 150–400)
RBC: 3.39 MIL/uL — ABNORMAL LOW (ref 3.87–5.11)
RDW: 13.2 % (ref 11.5–15.5)
WBC: 8 10*3/uL (ref 4.0–10.5)
nRBC: 0 % (ref 0.0–0.2)

## 2023-01-16 LAB — URINALYSIS, ROUTINE W REFLEX MICROSCOPIC
Bilirubin Urine: NEGATIVE
Glucose, UA: NEGATIVE mg/dL
Hgb urine dipstick: NEGATIVE
Ketones, ur: NEGATIVE mg/dL
Leukocytes,Ua: NEGATIVE
Nitrite: NEGATIVE
Protein, ur: NEGATIVE mg/dL
Specific Gravity, Urine: 1.012 (ref 1.005–1.030)
pH: 7 (ref 5.0–8.0)

## 2023-01-16 LAB — LIPID PANEL
Cholesterol: 174 mg/dL (ref 0–200)
HDL: 40 mg/dL — ABNORMAL LOW (ref >40)
LDL Cholesterol: 120 mg/dL — ABNORMAL HIGH (ref 0–99)
Total CHOL/HDL Ratio: 4.4 RATIO
Triglycerides: 69 mg/dL (ref <150)
VLDL: 14 mg/dL (ref 0–40)

## 2023-01-16 LAB — FOLATE: Folate: >40 ng/mL (ref >5.9)

## 2023-01-16 LAB — COMPREHENSIVE METABOLIC PANEL
ALT: 13 U/L (ref 0–44)
AST: 30 U/L (ref 15–41)
Albumin: 4 g/dL (ref 3.5–5.0)
Alkaline Phosphatase: 65 U/L (ref 38–126)
Anion gap: 13 (ref 5–15)
BUN: 40 mg/dL — ABNORMAL HIGH (ref 8–23)
CO2: 37 mmol/L — ABNORMAL HIGH (ref 22–32)
Calcium: 10.1 mg/dL (ref 8.9–10.3)
Chloride: 82 mmol/L — ABNORMAL LOW (ref 98–111)
Creatinine, Ser: 2.2 mg/dL — ABNORMAL HIGH (ref 0.44–1.00)
GFR, Estimated: 21 mL/min — ABNORMAL LOW (ref >60)
Glucose, Bld: 97 mg/dL (ref 70–99)
Potassium: 2.2 mmol/L — CL (ref 3.5–5.1)
Sodium: 132 mmol/L — ABNORMAL LOW (ref 135–145)
Total Bilirubin: 0.5 mg/dL (ref 0.3–1.2)
Total Protein: 6.7 g/dL (ref 6.5–8.1)

## 2023-01-16 LAB — TROPONIN I (HIGH SENSITIVITY)
Troponin I (High Sensitivity): 178 ng/L (ref <18)
Troponin I (High Sensitivity): 178 ng/L (ref <18)

## 2023-01-16 LAB — TSH: TSH: 0.862 u[IU]/mL (ref 0.350–4.500)

## 2023-01-16 LAB — SARS CORONAVIRUS 2 BY RT PCR: SARS Coronavirus 2 by RT PCR: POSITIVE — AB

## 2023-01-16 LAB — FERRITIN: Ferritin: 300 ng/mL (ref 11–307)

## 2023-01-16 LAB — BRAIN NATRIURETIC PEPTIDE: B Natriuretic Peptide: 265 pg/mL — ABNORMAL HIGH (ref 0.0–100.0)

## 2023-01-16 LAB — MRSA NEXT GEN BY PCR, NASAL: MRSA by PCR Next Gen: NOT DETECTED

## 2023-01-16 LAB — VITAMIN B12: Vitamin B-12: 2430 pg/mL — ABNORMAL HIGH (ref 180–914)

## 2023-01-16 LAB — MAGNESIUM: Magnesium: 1.9 mg/dL (ref 1.7–2.4)

## 2023-01-16 MED ORDER — ASPIRIN 81 MG PO TBEC: 81.0000 mg | DELAYED_RELEASE_TABLET | Freq: Every day | ORAL | Status: DC

## 2023-01-16 MED ORDER — HYDROCODONE-ACETAMINOPHEN 5-325 MG PO TABS: 1.0000 | ORAL_TABLET | ORAL | Status: DC | PRN

## 2023-01-16 MED ORDER — METOLAZONE 2.5 MG PO TABS
2.5000 mg | ORAL_TABLET | ORAL | Status: DC
  Filled 2023-01-16: qty 1

## 2023-01-16 MED ORDER — ONDANSETRON HCL 4 MG/2ML IJ SOLN: 4.0000 mg | Freq: Four times a day (QID) | INTRAMUSCULAR | Status: DC | PRN

## 2023-01-16 MED ORDER — POTASSIUM CHLORIDE CRYS ER 20 MEQ PO TBCR
40.0000 meq | EXTENDED_RELEASE_TABLET | Freq: Once | ORAL | Status: AC
  Administered 2023-01-16: 40 meq via ORAL
  Filled 2023-01-16: qty 2

## 2023-01-16 MED ORDER — BENZONATATE 100 MG PO CAPS: 100.0000 mg | ORAL_CAPSULE | Freq: Three times a day (TID) | ORAL | Status: DC | PRN

## 2023-01-16 MED ORDER — FERROUS SULFATE 325 (65 FE) MG PO TABS
325.0000 mg | ORAL_TABLET | ORAL | Status: DC
  Administered 2023-01-17 – 2023-01-19 (×2): 325 mg via ORAL
  Filled 2023-01-16 (×2): qty 1

## 2023-01-16 MED ORDER — MELATONIN 3 MG PO TABS
3.0000 mg | ORAL_TABLET | Freq: Every day | ORAL | Status: DC
  Administered 2023-01-16 – 2023-01-18 (×3): 3 mg via ORAL
  Filled 2023-01-16 (×3): qty 1

## 2023-01-16 MED ORDER — POTASSIUM CHLORIDE 10 MEQ/100ML IV SOLN
10.0000 meq | INTRAVENOUS | Status: AC
  Administered 2023-01-16 – 2023-01-17 (×2): 10 meq via INTRAVENOUS
  Filled 2023-01-16 (×2): qty 100

## 2023-01-16 MED ORDER — VITAMIN C 500 MG PO TABS
250.0000 mg | ORAL_TABLET | Freq: Every day | ORAL | Status: DC
  Administered 2023-01-17 – 2023-01-19 (×3): 250 mg via ORAL
  Filled 2023-01-16 (×3): qty 1

## 2023-01-16 MED ORDER — SODIUM CHLORIDE 0.9 % IV SOLN: INTRAVENOUS | Status: DC

## 2023-01-16 MED ORDER — ASPIRIN 325 MG PO TBEC: 325.0000 mg | DELAYED_RELEASE_TABLET | Freq: Every day | ORAL | Status: DC

## 2023-01-16 MED ORDER — ACETAMINOPHEN 325 MG PO TABS: 650.0000 mg | ORAL_TABLET | Freq: Four times a day (QID) | ORAL | Status: DC | PRN

## 2023-01-16 MED ORDER — ALBUTEROL SULFATE (2.5 MG/3ML) 0.083% IN NEBU: 2.5000 mg | INHALATION_SOLUTION | Freq: Four times a day (QID) | RESPIRATORY_TRACT | Status: DC | PRN

## 2023-01-16 MED ORDER — ATORVASTATIN CALCIUM 40 MG PO TABS
80.0000 mg | ORAL_TABLET | Freq: Every day | ORAL | Status: DC
  Administered 2023-01-16 – 2023-01-18 (×3): 80 mg via ORAL
  Filled 2023-01-16 (×3): qty 2

## 2023-01-16 MED ORDER — AMLODIPINE BESYLATE 5 MG PO TABS
5.0000 mg | ORAL_TABLET | Freq: Every day | ORAL | Status: DC
  Administered 2023-01-16 – 2023-01-19 (×4): 5 mg via ORAL
  Filled 2023-01-16 (×3): qty 1

## 2023-01-16 MED ORDER — VITAMIN D 25 MCG (1000 UNIT) PO TABS: 4000.0000 [IU] | ORAL_TABLET | ORAL | Status: DC

## 2023-01-16 MED ORDER — ONDANSETRON HCL 4 MG PO TABS: 4.0000 mg | ORAL_TABLET | Freq: Four times a day (QID) | ORAL | Status: DC | PRN

## 2023-01-16 MED ORDER — POTASSIUM CHLORIDE 10 MEQ/100ML IV SOLN
10.0000 meq | INTRAVENOUS | Status: AC
  Administered 2023-01-16 (×2): 10 meq via INTRAVENOUS
  Filled 2023-01-16 (×2): qty 100

## 2023-01-16 MED ORDER — PANTOPRAZOLE SODIUM 40 MG PO TBEC
40.0000 mg | DELAYED_RELEASE_TABLET | Freq: Every day | ORAL | Status: DC
  Administered 2023-01-16 – 2023-01-19 (×4): 40 mg via ORAL
  Filled 2023-01-16 (×5): qty 1

## 2023-01-16 MED ORDER — ASPIRIN 325 MG PO TBEC
325.0000 mg | DELAYED_RELEASE_TABLET | Freq: Once | ORAL | Status: AC
  Administered 2023-01-16: 325 mg via ORAL
  Filled 2023-01-16: qty 1

## 2023-01-16 MED ORDER — SENNOSIDES-DOCUSATE SODIUM 8.6-50 MG PO TABS: 1.0000 | ORAL_TABLET | Freq: Every evening | ORAL | Status: DC | PRN

## 2023-01-16 MED ORDER — TRAZODONE HCL 50 MG PO TABS: 25.0000 mg | ORAL_TABLET | Freq: Every evening | ORAL | Status: DC | PRN

## 2023-01-16 MED ORDER — IPRATROPIUM BROMIDE 0.02 % IN SOLN: 0.5000 mg | Freq: Four times a day (QID) | RESPIRATORY_TRACT | Status: DC | PRN

## 2023-01-16 MED ORDER — ORAL CARE MOUTH RINSE: 15.0000 mL | OROMUCOSAL | Status: DC | PRN

## 2023-01-16 MED ORDER — APIXABAN 2.5 MG PO TABS
2.5000 mg | ORAL_TABLET | Freq: Two times a day (BID) | ORAL | Status: DC
  Administered 2023-01-16 – 2023-01-17 (×2): 2.5 mg via ORAL
  Filled 2023-01-16 (×2): qty 1

## 2023-01-16 MED ORDER — MIRABEGRON ER 25 MG PO TB24
50.0000 mg | ORAL_TABLET | Freq: Every day | ORAL | Status: DC
  Administered 2023-01-17 – 2023-01-19 (×3): 50 mg via ORAL
  Filled 2023-01-16 (×4): qty 2

## 2023-01-16 MED ORDER — MORPHINE SULFATE (PF) 2 MG/ML IV SOLN: 1.0000 mg | Freq: Four times a day (QID) | INTRAVENOUS | Status: DC | PRN

## 2023-01-16 MED ORDER — FUROSEMIDE 40 MG PO TABS
40.0000 mg | ORAL_TABLET | Freq: Every day | ORAL | Status: DC
  Administered 2023-01-17 – 2023-01-18 (×2): 40 mg via ORAL
  Filled 2023-01-16 (×2): qty 1

## 2023-01-16 MED ORDER — ASPIRIN 81 MG PO TBEC
81.0000 mg | DELAYED_RELEASE_TABLET | Freq: Every day | ORAL | Status: DC
  Administered 2023-01-17: 81 mg via ORAL
  Filled 2023-01-16: qty 1

## 2023-01-16 MED ORDER — BISACODYL 5 MG PO TBEC: 5.0000 mg | DELAYED_RELEASE_TABLET | Freq: Every day | ORAL | Status: DC | PRN

## 2023-01-16 MED ORDER — ACETAMINOPHEN 650 MG RE SUPP: 650.0000 mg | Freq: Four times a day (QID) | RECTAL | Status: DC | PRN

## 2023-01-16 MED ORDER — TRAMADOL HCL 50 MG PO TABS: 50.0000 mg | ORAL_TABLET | Freq: Two times a day (BID) | ORAL | Status: DC | PRN

## 2023-01-16 MED ORDER — SODIUM CHLORIDE 0.9 % IV BOLUS
500.0000 mL | Freq: Once | INTRAVENOUS | Status: AC
  Administered 2023-01-16: 500 mL via INTRAVENOUS

## 2023-01-16 MED ORDER — TRAMADOL HCL 50 MG PO TABS: 50.0000 mg | ORAL_TABLET | Freq: Four times a day (QID) | ORAL | Status: DC | PRN

## 2023-01-16 MED ORDER — SODIUM CHLORIDE 0.9% FLUSH
3.0000 mL | Freq: Two times a day (BID) | INTRAVENOUS | Status: DC
  Administered 2023-01-17: 3 mL via INTRAVENOUS

## 2023-01-16 MED ORDER — HYDRALAZINE HCL 20 MG/ML IJ SOLN: 5.0000 mg | Freq: Four times a day (QID) | INTRAMUSCULAR | Status: DC | PRN

## 2023-01-16 MED ORDER — CARVEDILOL 3.125 MG PO TABS
3.1250 mg | ORAL_TABLET | Freq: Two times a day (BID) | ORAL | Status: DC
  Administered 2023-01-17 – 2023-01-19 (×5): 3.125 mg via ORAL
  Filled 2023-01-16 (×5): qty 1

## 2023-01-16 NOTE — ED Notes (Signed)
Attempted at collection of urine specimen. Patient was incontinent of urine. Incontinent care provided and linens changed.

## 2023-01-16 NOTE — H&P (Addendum)
History and Physical   [facility] [place] [facility] Admission History and Physical [employer], D.O.    Patient Name: Peggy [name] MR#: [id number] Date of Birth: 1928/06/27 Date of Admission: 01/16/2023  Referring MD/NP/PA: Dr. Deretha Emory @ [facility] Primary Care Physician: Mast, Man X, NP  Chief Complaint:  Chief Complaint  Patient presents with   Fall    HPI: Peggy [name] is a 89 y.o. female from assisted living with a known history of anemia, CKD, GERD, HTN presents to the emergency department for evaluation of fall.  Patient was in a usual state of health until today she sustained an unwitnessed fall.  Sent to the ER for evaluation of left lateral rib pain, left forearm skin tear.  Was also found to be hypoxic and tachycadic.  Per ED note appeared to be afib, new onset.   In my discussion with her she keeps repeating that she wants to go home and doesn't understand why she needs to stay in the hospital.  States that she feels fine, denies any complaints.   Patient denies fevers/chills, weakness, dizziness, chest pain, shortness of breath, N/V/C/D, abdominal pain, dysuria/frequency, changes in mental status.      EMS/ED Course: Patient received KCl, NS. Medical admission has been requested for further management of AKI, new onset afib, hypoxia 2/2 COVID, hypokalemia  Review of Systems:  Patient is a poor historian, denies all complaints. CONSTITUTIONAL: No fever/chills, fatigue, weakness, weight gain/loss, headache. EYES: No blurry or double vision. ENT: No tinnitus, postnasal drip, redness or soreness of the oropharynx. RESPIRATORY: No cough, dyspnea, wheeze.  No hemoptysis.  CARDIOVASCULAR: No chest pain, palpitations, syncope, orthopnea. No lower extremity edema.  GASTROINTESTINAL: No nausea, vomiting, abdominal pain, diarrhea, constipation.  No hematemesis, melena or hematochezia. GENITOURINARY: No dysuria, frequency, hematuria. ENDOCRINE: No polyuria or  nocturia. No heat or cold intolerance. HEMATOLOGY: No anemia, bruising, bleeding. INTEGUMENTARY: No rashes, ulcers, lesions. MUSCULOSKELETAL: No arthritis, gout. NEUROLOGIC: No numbness, tingling, ataxia, seizure-type activity, weakness. PSYCHIATRIC: No anxiety, depression, insomnia.   Past Medical History:  Diagnosis Date   Anemia    Chronic kidney disease    stage 3   GERD (gastroesophageal reflux disease)    Hypertension     Past Surgical History:  Procedure Laterality Date   ORIF ANKLE FRACTURE Right 01/13/2021   Procedure: OPEN TREATMENT OF RIGHT TRIMALLEOLAR ANKLE FRACTURE WITHOUT POSTERIOR FIXATION, POSSIBLE SYNDESMOSIS;  Surgeon: Terance Hart, MD;  Location: [facility];  Service: Orthopedics;  Laterality: Right;   TOTAL HIP ARTHROPLASTY Right 12/02/2021   Procedure: TOTAL HIP ARTHROPLASTY;  Surgeon: Joen Laura, MD;  Location: [facility];  Service: Orthopedics;  Laterality: Right;     reports that she quit smoking about 65 years ago. Her smoking use included cigarettes. She started smoking about 77 years ago. She has never used smokeless tobacco. She reports that she does not currently use alcohol. She reports that she does not use drugs.  Allergies  Allergen Reactions   Ace Inhibitors Other (See Comments)    Reaction unknown   Flagyl [Metronidazole] Other (See Comments)    Reaction unknown   Penicillins Swelling and Other (See Comments)    Facial swelling; can tolerate cephalosporins     Family History  Problem Relation Age of Onset   Heart failure Mother    Heart disease Father    Arthritis Sister     Prior to Admission medications   Medication Sig Start Date End Date Taking? Authorizing Provider  acetaminophen (TYLENOL) 325  MG tablet Take 2 tablets (650 mg total) by mouth every 6 (six) hours as needed for mild pain, fever or headache. 12/04/21   Meredeth Ide, MD  amLODipine (NORVASC) 5 MG tablet Take 1 tablet (5 mg total) by mouth daily. 08/19/22   Mast,  Man X, NP  aspirin EC 81 MG tablet Take 81 mg by mouth daily. Monday, Wednesday and Friday    [provider]  betamethasone dipropionate 0.05 % cream Apply topically. 11/18/22   [provider]  cholecalciferol (VITAMIN D3) 25 MCG (1000 UNIT) tablet Take 4,000 Units by mouth once a week.    [provider]  doxycycline (VIBRAMYCIN) 50 MG capsule Take 50 mg by mouth 2 (two) times daily. 12/08/22   [provider]  ferrous sulfate (FEROSUL) 325 (65 FE) MG tablet Take 1 tablet (325 mg total) by mouth daily with breakfast. MON, WED, FRI Patient taking differently: Take 325 mg by mouth 3 (three) times a week. 12/15/22   Mast, Man X, NP  furosemide (LASIX) 20 MG tablet Take 1 tablet (20 mg total) by mouth daily. 11/05/22   Fargo, Amy E, NP  furosemide (LASIX) 20 MG tablet Take 2 tablets (40 mg total) by mouth daily. 12/09/22   Mast, Man X, NP  metolazone (ZAROXOLYN) 2.5 MG tablet Take 1 tablet (2.5 mg total) by mouth every other day. 01/01/23   Fletcher Anon, NP  Multiple Vitamins-Minerals (ICAPS AREDS 2 PO) Take 1 capsule by mouth daily with breakfast.    [provider]  mupirocin ointment (BACTROBAN) 2 % Apply 1 Application topically 2 (two) times daily. 01/01/23   Fletcher Anon, NP  MYRBETRIQ 25 MG TB24 tablet TAKE 2 TABLETS BY MOUTH ONCE  DAILY 04/09/22   Mahlon Gammon, MD  pantoprazole (PROTONIX) 40 MG tablet Take 1 tablet (40 mg total) by mouth daily. 07/12/22   Mast, Man X, NP  predniSONE (DELTASONE) 10 MG tablet Take 10 mg by mouth 2 (two) times daily. 12/08/22   [provider]  vitamin C (ASCORBIC ACID) 250 MG tablet Take 1 tablet (250 mg total) by mouth daily. 04/13/21   Mast, Man X, NP    Physical Exam: Vitals:   01/16/23 1200 01/16/23 1205 01/16/23 1458 01/16/23 1818  BP:  139/82 (!) 162/95 (!) 155/91  Pulse: 100 100 75 (!) 109  Resp:  18 (!) 24 20  Temp:   97.7 F (36.5 C) 97.9 F (36.6 C)  TempSrc:   Oral Oral  SpO2:  96% 97% 93%   Weight:    54.6 kg  Height:   5' 5.5" (1.664 m)     GENERAL: 89 y.o.-year-old female patient, well-developed, well-nourished lying in the bed in no acute distress.  Pleasant and cooperative.   HEENT: Head atraumatic, normocephalic. Pupils equal. Mucus membranes moist. Disoloration over right eye and forehead which is chronic (birthmark( NECK: Supple. No JVD. CHEST: Normal breath sounds bilaterally. No wheezing, rales, rhonchi or crackles. No use of accessory muscles of respiration.  No reproducible chest wall tenderness.  CARDIOVASCULAR: Irregular S1, S2 normal. No murmurs, rubs, or gallops. Cap refill <2 seconds. Pulses intact distally.  ABDOMEN: Soft, nondistended, nontender. No rebound, guarding, rigidity. Normoactive bowel sounds present in all four quadrants.  EXTREMITIES: No pedal edema, cyanosis, or clubbing. No calf tenderness or Homan's sign.  NEUROLOGIC: The patient is alert and oriented x 2. Cranial nerves II through XII are grossly intact with no focal sensorimotor deficit. PSYCHIATRIC:  Normal affect, mood, thought content.  SKIN: Left forearm skin tear. Warm, dry, and intact without obvious rash, lesion, or ulcer.    Labs on Admission:  CBC: Recent Labs  Lab 01/16/23 1125  WBC 8.0  NEUTROABS 4.9  HGB 10.9*  HCT 31.8*  MCV 93.8  PLT 385   Basic Metabolic Panel: Recent Labs  Lab 01/16/23 1125  NA 132*  K 2.2*  CL 82*  CO2 37*  GLUCOSE 97  BUN 40*  CREATININE 2.20*  CALCIUM 10.1   GFR: Estimated Creatinine Clearance: 14.4 mL/min (A) (by C-G formula based on SCr of 2.2 mg/dL (H)). Liver Function Tests: Recent Labs  Lab 01/16/23 1125  AST 30  ALT 13  ALKPHOS 65  BILITOT 0.5  PROT 6.7  ALBUMIN 4.0   No results for input(s): "LIPASE", "AMYLASE" in the last 168 hours. No results for input(s): "AMMONIA" in the last 168 hours. Coagulation Profile: No results for input(s): "INR", "PROTIME" in the last 168 hours. Cardiac Enzymes: No results for input(s):  "CKTOTAL", "CKMB", "CKMBINDEX", "TROPONINI" in the last 168 hours. BNP (last 3 results) No results for input(s): "PROBNP" in the last 8760 hours. HbA1C: No results for input(s): "HGBA1C" in the last 72 hours. CBG: No results for input(s): "GLUCAP" in the last 168 hours. Lipid Profile: Recent Labs    01/16/23 1833  CHOL 174  HDL 40*  LDLCALC 120*  TRIG 69  CHOLHDL 4.4   Thyroid Function Tests: No results for input(s): "TSH", "T4TOTAL", "FREET4", "T3FREE", "THYROIDAB" in the last 72 hours. Anemia Panel: No results for input(s): "VITAMINB12", "FOLATE", "FERRITIN", "TIBC", "IRON", "RETICCTPCT" in the last 72 hours. Urine analysis:    Component Value Date/Time   COLORURINE YELLOW 05/17/2016 1242   APPEARANCEUR CLOUDY (A) 05/17/2016 1242   LABSPEC 1.015 05/17/2016 1242   PHURINE 6.0 05/17/2016 1242   GLUCOSEU NEGATIVE 05/17/2016 1242   HGBUR MODERATE (A) 05/17/2016 1242   BILIRUBINUR NEGATIVE 05/17/2016 1242   KETONESUR NEGATIVE 05/17/2016 1242   PROTEINUR >300 (A) 05/17/2016 1242   NITRITE NEGATIVE 05/17/2016 1242   LEUKOCYTESUR LARGE (A) 05/17/2016 1242   Sepsis Labs: @LABRCNTIP (procalcitonin:4,lacticidven:4) ) Recent Results (from the past 240 hour(s))  SARS Coronavirus 2 by RT PCR (hospital order, performed in [facility] hospital lab) *cepheid single result test* Anterior Nasal Swab     Status: Abnormal   Collection Time: 01/16/23  1:08 PM   Specimen: Anterior Nasal Swab  Result Value Ref Range Status   SARS Coronavirus 2 by RT PCR POSITIVE (A) NEGATIVE Final    Comment: (NOTE) SARS-CoV-2 target nucleic acids are DETECTED  SARS-CoV-2 RNA is generally detectable in upper respiratory specimens  during the acute phase of infection.  Positive results are indicative  of the presence of the identified virus, but do not rule out bacterial infection or co-infection with other pathogens not detected by the test.  Clinical correlation with patient history and  other  diagnostic information is necessary to determine patient infection status.  The expected result is negative.  Fact Sheet for Patients:   RoadLapTop.co.za   Fact Sheet for Healthcare Providers:   http://kim-miller.com/    This test is not yet approved or cleared by the [place] FDA and  has been authorized for detection and/or diagnosis of SARS-CoV-2 by FDA under an Emergency Use Authorization (EUA).  This EUA will remain in effect (meaning this test can be used) for the duration of  the COVID-19 declaration under Section 564(b)(1)  of the Act, 21 U.S.C. section 360-bbb-3(b)(1), unless the authorization is terminated  or revoked sooner.   Performed at [facility], [address] [place], [place] [address]      Radiological Exams on Admission: DG Forearm Left  Result Date: 01/16/2023 CLINICAL DATA:  Fall.  Pain. EXAM: LEFT FOREARM - 2 VIEW COMPARISON:  None Available. FINDINGS: There is no evidence of fracture or other focal bone lesions. Soft tissues are unremarkable. IMPRESSION: Negative. Electronically Signed   By: Paulina Fusi M.D.   On: 01/16/2023 12:10   DG Hips Bilat W or Wo Pelvis 3-4 Views  Result Date: 01/16/2023 CLINICAL DATA:  Larey Seat.  Hip and pelvic pain. EXAM: DG HIP (WITH OR WITHOUT PELVIS) 3-4V BILAT COMPARISON:  12/02/2021 FINDINGS: Previous hip replacement on the right. No unexpected finding relative to that. No evidence of pelvic fracture. The left hip shows mild degenerative arthritis but no acute traumatic finding. Calcified leiomyoma incidentally noted in the central pelvis. IMPRESSION: No acute or traumatic finding. Previous right hip replacement. Electronically Signed   By: Paulina Fusi M.D.   On: 01/16/2023 12:09   DG Ribs Unilateral W/Chest Left  Result Date: 01/16/2023 CLINICAL DATA:  Fall with left arm pain and left chest pain EXAM: LEFT RIBS AND CHEST - 3+ VIEW COMPARISON:  11/30/2021  FINDINGS: Hiatal hernia. Heart size is normal. Tortuous aorta. The lungs are clear. No pneumothorax or hemothorax. Left rib films do not show a fracture. Chronic degenerative changes affect the shoulders. Scoliosis of the spine. IMPRESSION: No acute or traumatic finding. Hiatal hernia. Tortuous aorta. Scoliosis. Chronic degenerative changes of the shoulders. Electronically Signed   By: Paulina Fusi M.D.   On: 01/16/2023 12:08   CT Head Wo Contrast  Result Date: 01/16/2023 CLINICAL DATA:  Fall out of bed with head and neck pain. EXAM: CT HEAD WITHOUT CONTRAST CT CERVICAL SPINE WITHOUT CONTRAST TECHNIQUE: Multidetector CT imaging of the head and cervical spine was performed following the standard protocol without intravenous contrast. Multiplanar CT image reconstructions of the cervical spine were also generated. RADIATION DOSE REDUCTION: This exam was performed according to the departmental dose-optimization program which includes automated exposure control, adjustment of the mA and/or kV according to patient size and/or use of iterative reconstruction technique. COMPARISON:  None Available. FINDINGS: CT HEAD FINDINGS Brain: No evidence of acute infarction, hemorrhage, hydrocephalus, extra-axial collection or mass lesion/mass effect. There is mild cerebral volume loss with associated ex vacuo dilatation. Periventricular white matter hypoattenuation likely represents chronic small vessel ischemic disease. Vascular: There are vascular calcifications in the carotid siphons. Skull: Normal. Negative for fracture or focal lesion. Sinuses/Orbits: No acute finding. Other: None. CT CERVICAL SPINE FINDINGS Alignment: No traumatic listhesis. Skull base and vertebrae: No acute fracture. No primary bone lesion or focal pathologic process. Soft tissues and spinal canal: No prevertebral fluid or swelling. No visible canal hematoma. Disc levels: Moderate to severe multilevel degenerative disc and joint disease. Upper chest:  Negative. Other: None. IMPRESSION: 1. No acute intracranial process. 2. No acute osseous injury in the cervical spine. Electronically Signed   By: Romona Curls M.D.   On: 01/16/2023 11:53   CT Cervical Spine Wo Contrast  Result Date: 01/16/2023 CLINICAL DATA:  Fall out of bed with head and neck pain. EXAM: CT HEAD WITHOUT CONTRAST CT CERVICAL SPINE WITHOUT CONTRAST TECHNIQUE: Multidetector CT imaging of the head and cervical spine was performed following the standard protocol without intravenous contrast. Multiplanar CT image reconstructions of the cervical spine were also generated. RADIATION DOSE REDUCTION: This exam was performed according to the departmental  dose-optimization program which includes automated exposure control, adjustment of the mA and/or kV according to patient size and/or use of iterative reconstruction technique. COMPARISON:  None Available. FINDINGS: CT HEAD FINDINGS Brain: No evidence of acute infarction, hemorrhage, hydrocephalus, extra-axial collection or mass lesion/mass effect. There is mild cerebral volume loss with associated ex vacuo dilatation. Periventricular white matter hypoattenuation likely represents chronic small vessel ischemic disease. Vascular: There are vascular calcifications in the carotid siphons. Skull: Normal. Negative for fracture or focal lesion. Sinuses/Orbits: No acute finding. Other: None. CT CERVICAL SPINE FINDINGS Alignment: No traumatic listhesis. Skull base and vertebrae: No acute fracture. No primary bone lesion or focal pathologic process. Soft tissues and spinal canal: No prevertebral fluid or swelling. No visible canal hematoma. Disc levels: Moderate to severe multilevel degenerative disc and joint disease. Upper chest: Negative. Other: None. IMPRESSION: 1. No acute intracranial process. 2. No acute osseous injury in the cervical spine. Electronically Signed   By: Romona Curls M.D.   On: 01/16/2023 11:53    EKG: Sinus tach at 104 bpm with leftward  axis, 1st degree AV block, PVCs. and nonspecific ST-T wave changes.   Assessment/Plan  This is a 89 y.o. female with a history of  anemia, CKD, GERD, HTN now being admitted with:  #. Acute kidney injury  on CKD baseline Cr 1.3-1.7 - Admit IP, tele - IV fluids and repeat BMP in AM.  - Avoid nephrotoxic medications - Bladder scan and place foley catheter if evidence of urinary retention   #. New onset afib with elevated trop - Trend trops, check TSH, lipids - Check echo - Added Coreg, statin, aspirin - Cardiology consulted  #. Hypoxia 2/2 COVID - O2, nebs PRN - Droplet precautions  #. Hypokalemia - replace orally - Check mag level  #. Fall - No fractures - Fall precautions - Incentive spirometry - PT eval and treat  #. History of anemia, stable - Continue monitor CBC - Check ferritin, B12, folate - Continue ferrous sulfate  #. History of HTN - Continue Continue Norvasc, Lasix, metolazone  #. History of GERD - Continue Protonix    Admission status: Inpatient, tele IV Fluids: NS Diet/Nutrition: Heart healthy Consults called: None  DVT Px:  Eliquis SCDs and early ambulation. Code Status: Full Code  Disposition Plan: To home in 1-2 days  All the records are reviewed and case discussed with ED provider. Management plans discussed with the patient and/or family who express understanding and agree with plan of care.    D.O. on 01/16/2023 at 7:41 PM CC: Primary care physician; Mast, Man X, NP   01/16/2023, 7:41 PM

## 2023-01-16 NOTE — ED Provider Notes (Addendum)
[place] EMERGENCY DEPARTMENT AT [facility] Provider Note   CSN: [id number] Arrival date & time: 01/16/23  1055     History  Chief Complaint  Patient presents with   Marletta Lor    Peggy [name] is a 89 y.o. female.  Patient brought in by EMS unwitnessed fall at assisted living.  Patient with skin tear to the left forearm.  Complaint of pain to the left lateral rib area.  Patient does not recall any of the events.  Patient noted here to have oxygen saturation of 88% on room air apparently not on oxygen normally.  Patient's heart rate is also irregular with a rate of 101.  Temp 98.  Past medical history significant hypertension chronic kidney disease and anemia.  No mention of dementia.  Patient is a former smoker quit in 1959.       Home Medications Prior to Admission medications   Medication Sig Start Date End Date Taking? Authorizing Provider  acetaminophen (TYLENOL) 325 MG tablet Take 2 tablets (650 mg total) by mouth every 6 (six) hours as needed for mild pain, fever or headache. 12/04/21   Meredeth Ide, MD  amLODipine (NORVASC) 5 MG tablet Take 1 tablet (5 mg total) by mouth daily. 08/19/22   Mast, Man X, NP  aspirin EC 81 MG tablet Take 81 mg by mouth daily. Monday, Wednesday and Friday    [provider]  betamethasone dipropionate 0.05 % cream Apply topically. 11/18/22   [provider]  cholecalciferol (VITAMIN D3) 25 MCG (1000 UNIT) tablet Take 4,000 Units by mouth once a week.    [provider]  doxycycline (VIBRAMYCIN) 50 MG capsule Take 50 mg by mouth 2 (two) times daily. 12/08/22   [provider]  ferrous sulfate (FEROSUL) 325 (65 FE) MG tablet Take 1 tablet (325 mg total) by mouth daily with breakfast. MON, WED, FRI 12/15/22   Mast, Man X, NP  furosemide (LASIX) 20 MG tablet Take 1 tablet (20 mg total) by mouth daily. 11/05/22   Fargo, Amy E, NP  furosemide (LASIX) 20 MG tablet Take 2 tablets (40 mg total) by mouth daily. 12/09/22    Mast, Man X, NP  metolazone (ZAROXOLYN) 2.5 MG tablet Take 1 tablet (2.5 mg total) by mouth every other day. 01/01/23   Fletcher Anon, NP  Multiple Vitamins-Minerals (ICAPS AREDS 2 PO) Take 1 capsule by mouth daily with breakfast.    [provider]  mupirocin ointment (BACTROBAN) 2 % Apply 1 Application topically 2 (two) times daily. 01/01/23   Fletcher Anon, NP  MYRBETRIQ 25 MG TB24 tablet TAKE 2 TABLETS BY MOUTH ONCE  DAILY 04/09/22   Mahlon Gammon, MD  pantoprazole (PROTONIX) 40 MG tablet Take 1 tablet (40 mg total) by mouth daily. 07/12/22   Mast, Man X, NP  predniSONE (DELTASONE) 10 MG tablet Take 10 mg by mouth 2 (two) times daily. 12/08/22   [provider]  vitamin C (ASCORBIC ACID) 250 MG tablet Take 1 tablet (250 mg total) by mouth daily. 04/13/21   Mast, Man X, NP      Allergies    Ace inhibitors, Flagyl [metronidazole], and Penicillins    Review of Systems   Review of Systems  Unable to perform ROS: Dementia  Psychiatric/Behavioral:  Positive for confusion.   All other systems reviewed and are negative.   Physical Exam Updated Vital Signs BP 139/82   Pulse 100   Temp 98 F (36.7 C) (Oral)   Resp 18  SpO2 96%  Physical Exam Vitals and nursing note reviewed.  Constitutional:      General: She is not in acute distress.    Appearance: Normal appearance. She is well-developed. She is not ill-appearing.  HENT:     Head: Normocephalic and atraumatic.  Eyes:     Extraocular Movements: Extraocular movements intact.     Conjunctiva/sclera: Conjunctivae normal.     Pupils: Pupils are equal, round, and reactive to light.     Comments: Bruise to right lower eyelid patient states that that is chronic.  Cardiovascular:     Rate and Rhythm: Normal rate and regular rhythm.     Heart sounds: No murmur heard. Pulmonary:     Effort: Pulmonary effort is normal. No respiratory distress.     Breath sounds: Normal breath sounds.     Comments: Tenderness to  palpation to the left lateral rib area. Chest:     Chest wall: Tenderness present.  Abdominal:     Palpations: Abdomen is soft.     Tenderness: There is no abdominal tenderness.  Musculoskeletal:        General: No swelling.     Cervical back: Neck supple. No rigidity.     Comments: Skin tear to left forearm.  Skin tear to the forearm measuring about 4 cm.  Just basically elliptical peel of the skin back.  Good radial pulse.  Skin:    General: Skin is warm and dry.     Capillary Refill: Capillary refill takes less than 2 seconds.  Neurological:     General: No focal deficit present.     Mental Status: She is alert.     Comments: Patient with confusion about events.  Psychiatric:        Mood and Affect: Mood normal.     ED Results / Procedures / Treatments   Labs (all labs ordered are listed, but only abnormal results are displayed) Labs Reviewed  CBC WITH DIFFERENTIAL/PLATELET - Abnormal; Notable for the following components:      Result Value   RBC 3.39 (*)    Hemoglobin 10.9 (*)    HCT 31.8 (*)    Monocytes Absolute 1.5 (*)    All other components within normal limits  COMPREHENSIVE METABOLIC PANEL - Abnormal; Notable for the following components:   Sodium 132 (*)    Potassium 2.2 (*)    Chloride 82 (*)    CO2 37 (*)    BUN 40 (*)    Creatinine, Ser 2.20 (*)    GFR, Estimated 21 (*)    All other components within normal limits    EKG None  Radiology DG Forearm Left  Result Date: 01/16/2023 CLINICAL DATA:  Fall.  Pain. EXAM: LEFT FOREARM - 2 VIEW COMPARISON:  None Available. FINDINGS: There is no evidence of fracture or other focal bone lesions. Soft tissues are unremarkable. IMPRESSION: Negative. Electronically Signed   By: Paulina Fusi M.D.   On: 01/16/2023 12:10   DG Hips Bilat W or Wo Pelvis 3-4 Views  Result Date: 01/16/2023 CLINICAL DATA:  Larey Seat.  Hip and pelvic pain. EXAM: DG HIP (WITH OR WITHOUT PELVIS) 3-4V BILAT COMPARISON:  12/02/2021 FINDINGS: Previous  hip replacement on the right. No unexpected finding relative to that. No evidence of pelvic fracture. The left hip shows mild degenerative arthritis but no acute traumatic finding. Calcified leiomyoma incidentally noted in the central pelvis. IMPRESSION: No acute or traumatic finding. Previous right hip replacement. Electronically Signed   By: Loraine Leriche  Shogry M.D.   On: 01/16/2023 12:09   DG Ribs Unilateral W/Chest Left  Result Date: 01/16/2023 CLINICAL DATA:  Fall with left arm pain and left chest pain EXAM: LEFT RIBS AND CHEST - 3+ VIEW COMPARISON:  11/30/2021 FINDINGS: Hiatal hernia. Heart size is normal. Tortuous aorta. The lungs are clear. No pneumothorax or hemothorax. Left rib films do not show a fracture. Chronic degenerative changes affect the shoulders. Scoliosis of the spine. IMPRESSION: No acute or traumatic finding. Hiatal hernia. Tortuous aorta. Scoliosis. Chronic degenerative changes of the shoulders. Electronically Signed   By: Paulina Fusi M.D.   On: 01/16/2023 12:08   CT Head Wo Contrast  Result Date: 01/16/2023 CLINICAL DATA:  Fall out of bed with head and neck pain. EXAM: CT HEAD WITHOUT CONTRAST CT CERVICAL SPINE WITHOUT CONTRAST TECHNIQUE: Multidetector CT imaging of the head and cervical spine was performed following the standard protocol without intravenous contrast. Multiplanar CT image reconstructions of the cervical spine were also generated. RADIATION DOSE REDUCTION: This exam was performed according to the departmental dose-optimization program which includes automated exposure control, adjustment of the mA and/or kV according to patient size and/or use of iterative reconstruction technique. COMPARISON:  None Available. FINDINGS: CT HEAD FINDINGS Brain: No evidence of acute infarction, hemorrhage, hydrocephalus, extra-axial collection or mass lesion/mass effect. There is mild cerebral volume loss with associated ex vacuo dilatation. Periventricular white matter hypoattenuation likely  represents chronic small vessel ischemic disease. Vascular: There are vascular calcifications in the carotid siphons. Skull: Normal. Negative for fracture or focal lesion. Sinuses/Orbits: No acute finding. Other: None. CT CERVICAL SPINE FINDINGS Alignment: No traumatic listhesis. Skull base and vertebrae: No acute fracture. No primary bone lesion or focal pathologic process. Soft tissues and spinal canal: No prevertebral fluid or swelling. No visible canal hematoma. Disc levels: Moderate to severe multilevel degenerative disc and joint disease. Upper chest: Negative. Other: None. IMPRESSION: 1. No acute intracranial process. 2. No acute osseous injury in the cervical spine. Electronically Signed   By: Romona Curls M.D.   On: 01/16/2023 11:53   CT Cervical Spine Wo Contrast  Result Date: 01/16/2023 CLINICAL DATA:  Fall out of bed with head and neck pain. EXAM: CT HEAD WITHOUT CONTRAST CT CERVICAL SPINE WITHOUT CONTRAST TECHNIQUE: Multidetector CT imaging of the head and cervical spine was performed following the standard protocol without intravenous contrast. Multiplanar CT image reconstructions of the cervical spine were also generated. RADIATION DOSE REDUCTION: This exam was performed according to the departmental dose-optimization program which includes automated exposure control, adjustment of the mA and/or kV according to patient size and/or use of iterative reconstruction technique. COMPARISON:  None Available. FINDINGS: CT HEAD FINDINGS Brain: No evidence of acute infarction, hemorrhage, hydrocephalus, extra-axial collection or mass lesion/mass effect. There is mild cerebral volume loss with associated ex vacuo dilatation. Periventricular white matter hypoattenuation likely represents chronic small vessel ischemic disease. Vascular: There are vascular calcifications in the carotid siphons. Skull: Normal. Negative for fracture or focal lesion. Sinuses/Orbits: No acute finding. Other: None. CT CERVICAL SPINE  FINDINGS Alignment: No traumatic listhesis. Skull base and vertebrae: No acute fracture. No primary bone lesion or focal pathologic process. Soft tissues and spinal canal: No prevertebral fluid or swelling. No visible canal hematoma. Disc levels: Moderate to severe multilevel degenerative disc and joint disease. Upper chest: Negative. Other: None. IMPRESSION: 1. No acute intracranial process. 2. No acute osseous injury in the cervical spine. Electronically Signed   By: Romona Curls M.D.   On: 01/16/2023 11:53  Procedures Procedures    Medications Ordered in ED Medications  potassium chloride 10 mEq in 100 mL IVPB (has no administration in time range)  potassium chloride SA (KLOR-CON M) CR tablet 40 mEq (has no administration in time range)  0.9 %  sodium chloride infusion (has no administration in time range)  sodium chloride 0.9 % bolus 500 mL (has no administration in time range)    ED Course/ Medical Decision Making/ A&P                                 Medical Decision Making Amount and/or Complexity of Data Reviewed Labs: ordered. Radiology: ordered.  Risk Prescription drug management. Decision regarding hospitalization.   Patient with unwitnessed fall.  Seems to be confused.  Not sure if this is baseline.  Will get CT head and neck.  Will get rib series and chest x-ray with concern for possible left-sided rib fractures.  Skin tear to left forearm.  Will get x-rays of the left forearm as well.  In both hips.  Because patient not very cooperative.  New findings based on heart monitor questionable atrial fibs with heart rate in the low 100s.  Also concerns for hypoxia.  Patient's oxygen sats 88% on room air.  As patient reportedly not normally on oxygen.  Based on the hypoxia may require admission.  CRITICAL CARE Performed by: Vanetta Mulders Total critical care time: 40 minutes Critical care time was exclusive of separately billable procedures and treating other  patients. Critical care was necessary to treat or prevent imminent or life-threatening deterioration. Critical care was time spent personally by me on the following activities: development of treatment plan with patient and/or surrogate as well as nursing, discussions with consultants, evaluation of patient's response to treatment, examination of patient, obtaining history from patient or surrogate, ordering and performing treatments and interventions, ordering and review of laboratory studies, ordering and review of radiographic studies, pulse oximetry and re-evaluation of patient's condition.  Workup here significant for potassium of 2.2.  Also renal function significantly worse than it was in March.  Suggestive of acute kidney injury.  Also in addition on room air patient has oxygen sats in the upper 80s.  So has a degree of hypoxia.  Not clear why because chest x-ray is clear.  No evidence of any rib fractures.  CBC white count 8 hemoglobin 10.9 platelets are normal x-ray of the left forearm no bony abnormalities x-rays of both hips and pelvis without any bony abnormalities.  CT head and neck without any acute findings.  Will give 2 rounds of IV potassium even though GFR is low at 23.  And will give some oral potassium since potassium is markedly down to 2.2.  In summary patient requires admission for the hypokalemia.  Requires admission for the hypoxia.  No chest x-ray evidence to explain that.  Renal function is not good enough to do CT angio of the chest.  And patient needs admission for the acute kidney injury.  I suspect it is prerenal because she appears dehydrated.  Only wants to drink coffee.  Will be treating with some IV fluids.  In addition seems to be new onset atrial fibrillation but heart rates in the 90s.  So does not need rate control.  Contact hospitalist for admission.  Hospitalist requested that we order COVID test so that is been ordered.  Hospitalist will admit    Final  Clinical Impression(s) /  ED Diagnoses Final diagnoses:  Fall, initial encounter  Skin tear of left forearm without complication, initial encounter  Hypoxia  Hypokalemia  AKI (acute kidney injury) [facility])    Rx / DC Orders ED Discharge Orders     None         Vanetta Mulders, MD 01/16/23 1223    Vanetta Mulders, MD 01/16/23 1230    Vanetta Mulders, MD 01/16/23 1236    Vanetta Mulders, MD 01/16/23 1251

## 2023-01-16 NOTE — Plan of Care (Signed)
  Problem: Education: Goal: Knowledge of General Education information will improve Description: Including pain rating scale, medication(s)/side effects and non-pharmacologic comfort measures Outcome: Progressing   Problem: Nutrition: Goal: Adequate nutrition will be maintained Outcome: Progressing   Problem: Coping: Goal: Level of anxiety will decrease Outcome: Progressing   Problem: Elimination: Goal: Will not experience complications related to urinary retention Outcome: Progressing   Problem: Pain Managment: Goal: General experience of comfort will improve Outcome: Progressing   Problem: Safety: Goal: Ability to remain free from injury will improve Outcome: Progressing   Problem: Skin Integrity: Goal: Risk for impaired skin integrity will decrease Outcome: Progressing   Problem: Coping: Goal: Psychosocial and spiritual needs will be supported Outcome: Progressing   Problem: Respiratory: Goal: Will maintain a patent airway Outcome: Progressing

## 2023-01-16 NOTE — ED Notes (Signed)
Attempted to call report, RN unable to receive it at this time.

## 2023-01-16 NOTE — ED Notes (Signed)
Lauren with cl called for transport 

## 2023-01-16 NOTE — ED Triage Notes (Signed)
Per EMS, patient had unwitnessed fall at assisted living. Injury noted to left arm, left rib area. Patient is unable to recall events of fall. Patient denies chest pain, shortness of breath. No obvious deformities noted.

## 2023-01-16 NOTE — Plan of Care (Signed)
Plan of Care Note for Accepted Transfer   Patient: Peggy [name]    WGN:[id number]     Facility requesting transfer: [facility] Requesting Provider: Baxter Hire  50F from assisted living found to have hypokalemia, new hypoxia and new atrial fibrillation.  AKI, COVID-positive.  Accepted to medical telemetry bed, requiring 2 L nasal cannula oxygen, rate controlled.  Most recent vitals, labs and radiology:  Blood pressure 139/82, pulse 100, temperature 98 F (36.7 C), temperature source Oral, resp. rate 18, SpO2 96%.      Latest Ref Rng & Units 01/16/2023   11:25 AM 12/30/2022    7:15 AM 08/30/2022   12:00 AM  CBC  WBC 4.0 - 10.5 K/uL 8.0  8.5    Hemoglobin 12.0 - 15.0 g/dL 78.4  69.6  29.5      Hematocrit 36.0 - 46.0 % 31.8  30.4    Platelets 150 - 400 K/uL 385  201       This result is from an external source.      Latest Ref Rng & Units 01/16/2023   11:25 AM 08/30/2022   12:00 AM 05/03/2022   12:00 AM  BMP  Glucose 70 - 99 mg/dL 97     BUN 8 - 23 mg/dL 40  35     38      Creatinine 0.44 - 1.00 mg/dL 2.84  1.7     1.5      Sodium 135 - 145 mmol/L 132  137     138      Potassium 3.5 - 5.1 mmol/L 2.2  4.9     4.0      Chloride 98 - 111 mmol/L 82  99     102      CO2 22 - 32 mmol/L 37  29     25      Calcium 8.9 - 10.3 mg/dL 13.2  44.0     10.2         This result is from an external source.     DG Forearm Left  Result Date: 01/16/2023 CLINICAL DATA:  Fall.  Pain. EXAM: LEFT FOREARM - 2 VIEW COMPARISON:  None Available. FINDINGS: There is no evidence of fracture or other focal bone lesions. Soft tissues are unremarkable. IMPRESSION: Negative. Electronically Signed   By: Paulina Fusi M.D.   On: 01/16/2023 12:10   DG Hips Bilat W or Wo Pelvis 3-4 Views  Result Date: 01/16/2023 CLINICAL DATA:  Larey Seat.  Hip and pelvic pain. EXAM: DG HIP (WITH OR WITHOUT PELVIS) 3-4V BILAT COMPARISON:  12/02/2021 FINDINGS: Previous hip replacement on the right. No unexpected finding relative to that. No  evidence of pelvic fracture. The left hip shows mild degenerative arthritis but no acute traumatic finding. Calcified leiomyoma incidentally noted in the central pelvis. IMPRESSION: No acute or traumatic finding. Previous right hip replacement. Electronically Signed   By: Paulina Fusi M.D.   On: 01/16/2023 12:09   DG Ribs Unilateral W/Chest Left  Result Date: 01/16/2023 CLINICAL DATA:  Fall with left arm pain and left chest pain EXAM: LEFT RIBS AND CHEST - 3+ VIEW COMPARISON:  11/30/2021 FINDINGS: Hiatal hernia. Heart size is normal. Tortuous aorta. The lungs are clear. No pneumothorax or hemothorax. Left rib films do not show a fracture. Chronic degenerative changes affect the shoulders. Scoliosis of the spine. IMPRESSION: No acute or traumatic finding. Hiatal hernia. Tortuous aorta. Scoliosis. Chronic degenerative changes of the shoulders. Electronically Signed   By: Scherrie Bateman.D.  On: 01/16/2023 12:08   CT Head Wo Contrast  Result Date: 01/16/2023 CLINICAL DATA:  Fall out of bed with head and neck pain. EXAM: CT HEAD WITHOUT CONTRAST CT CERVICAL SPINE WITHOUT CONTRAST TECHNIQUE: Multidetector CT imaging of the head and cervical spine was performed following the standard protocol without intravenous contrast. Multiplanar CT image reconstructions of the cervical spine were also generated. RADIATION DOSE REDUCTION: This exam was performed according to the departmental dose-optimization program which includes automated exposure control, adjustment of the mA and/or kV according to patient size and/or use of iterative reconstruction technique. COMPARISON:  None Available. FINDINGS: CT HEAD FINDINGS Brain: No evidence of acute infarction, hemorrhage, hydrocephalus, extra-axial collection or mass lesion/mass effect. There is mild cerebral volume loss with associated ex vacuo dilatation. Periventricular white matter hypoattenuation likely represents chronic small vessel ischemic disease. Vascular: There are  vascular calcifications in the carotid siphons. Skull: Normal. Negative for fracture or focal lesion. Sinuses/Orbits: No acute finding. Other: None. CT CERVICAL SPINE FINDINGS Alignment: No traumatic listhesis. Skull base and vertebrae: No acute fracture. No primary bone lesion or focal pathologic process. Soft tissues and spinal canal: No prevertebral fluid or swelling. No visible canal hematoma. Disc levels: Moderate to severe multilevel degenerative disc and joint disease. Upper chest: Negative. Other: None. IMPRESSION: 1. No acute intracranial process. 2. No acute osseous injury in the cervical spine. Electronically Signed   By: Romona Curls M.D.   On: 01/16/2023 11:53   CT Cervical Spine Wo Contrast  Result Date: 01/16/2023 CLINICAL DATA:  Fall out of bed with head and neck pain. EXAM: CT HEAD WITHOUT CONTRAST CT CERVICAL SPINE WITHOUT CONTRAST TECHNIQUE: Multidetector CT imaging of the head and cervical spine was performed following the standard protocol without intravenous contrast. Multiplanar CT image reconstructions of the cervical spine were also generated. RADIATION DOSE REDUCTION: This exam was performed according to the departmental dose-optimization program which includes automated exposure control, adjustment of the mA and/or kV according to patient size and/or use of iterative reconstruction technique. COMPARISON:  None Available. FINDINGS: CT HEAD FINDINGS Brain: No evidence of acute infarction, hemorrhage, hydrocephalus, extra-axial collection or mass lesion/mass effect. There is mild cerebral volume loss with associated ex vacuo dilatation. Periventricular white matter hypoattenuation likely represents chronic small vessel ischemic disease. Vascular: There are vascular calcifications in the carotid siphons. Skull: Normal. Negative for fracture or focal lesion. Sinuses/Orbits: No acute finding. Other: None. CT CERVICAL SPINE FINDINGS Alignment: No traumatic listhesis. Skull base and vertebrae:  No acute fracture. No primary bone lesion or focal pathologic process. Soft tissues and spinal canal: No prevertebral fluid or swelling. No visible canal hematoma. Disc levels: Moderate to severe multilevel degenerative disc and joint disease. Upper chest: Negative. Other: None. IMPRESSION: 1. No acute intracranial process. 2. No acute osseous injury in the cervical spine. Electronically Signed   By: Romona Curls M.D.   On: 01/16/2023 11:53     The patient has been accepted for transfer to [facility] or [facility], depending on bed and resource availability. The patient will remain under the care and responsibility of the referring provider until they have arrived to our inpatient facility.  Author:  Sharlette Dense, MD  01/16/2023  Check [email or web address] for on-call coverage.  Nursing staff, Please call [facility] Admits & Consults System-Wide number on Amion as soon as patient's arrival, so appropriate admitting provider can evaluate the pt.

## 2023-01-16 NOTE — ED Notes (Signed)
Patient transported to CT 

## 2023-01-17 ENCOUNTER — Inpatient Hospital Stay (HOSPITAL_COMMUNITY): Payer: Medicare Other

## 2023-01-17 DIAGNOSIS — I429 Cardiomyopathy, unspecified: Secondary | ICD-10-CM | POA: Diagnosis not present

## 2023-01-17 DIAGNOSIS — E876 Hypokalemia: Secondary | ICD-10-CM | POA: Diagnosis not present

## 2023-01-17 DIAGNOSIS — W19XXXA Unspecified fall, initial encounter: Secondary | ICD-10-CM

## 2023-01-17 DIAGNOSIS — I5022 Chronic systolic (congestive) heart failure: Secondary | ICD-10-CM

## 2023-01-17 DIAGNOSIS — U071 COVID-19: Secondary | ICD-10-CM | POA: Diagnosis not present

## 2023-01-17 DIAGNOSIS — R7989 Other specified abnormal findings of blood chemistry: Secondary | ICD-10-CM

## 2023-01-17 DIAGNOSIS — R0902 Hypoxemia: Secondary | ICD-10-CM | POA: Diagnosis not present

## 2023-01-17 DIAGNOSIS — I498 Other specified cardiac arrhythmias: Secondary | ICD-10-CM | POA: Diagnosis not present

## 2023-01-17 DIAGNOSIS — S51812A Laceration without foreign body of left forearm, initial encounter: Secondary | ICD-10-CM

## 2023-01-17 DIAGNOSIS — I4892 Unspecified atrial flutter: Secondary | ICD-10-CM

## 2023-01-17 DIAGNOSIS — N179 Acute kidney failure, unspecified: Secondary | ICD-10-CM | POA: Diagnosis not present

## 2023-01-17 LAB — BASIC METABOLIC PANEL
Anion gap: 11 (ref 5–15)
BUN: 35 mg/dL — ABNORMAL HIGH (ref 8–23)
CO2: 28 mmol/L (ref 22–32)
Calcium: 8.5 mg/dL — ABNORMAL LOW (ref 8.9–10.3)
Chloride: 95 mmol/L — ABNORMAL LOW (ref 98–111)
Creatinine, Ser: 1.8 mg/dL — ABNORMAL HIGH (ref 0.44–1.00)
GFR, Estimated: 26 mL/min — ABNORMAL LOW (ref >60)
Glucose, Bld: 106 mg/dL — ABNORMAL HIGH (ref 70–99)
Potassium: 2.9 mmol/L — ABNORMAL LOW (ref 3.5–5.1)
Sodium: 134 mmol/L — ABNORMAL LOW (ref 135–145)

## 2023-01-17 MED ORDER — POTASSIUM CHLORIDE CRYS ER 20 MEQ PO TBCR
40.0000 meq | EXTENDED_RELEASE_TABLET | Freq: Three times a day (TID) | ORAL | Status: DC
  Administered 2023-01-17 – 2023-01-18 (×4): 40 meq via ORAL
  Filled 2023-01-17 (×4): qty 2

## 2023-01-17 MED ORDER — POTASSIUM CL IN DEXTROSE 5% 20 MEQ/L IV SOLN
20.0000 meq | INTRAVENOUS | Status: DC
  Administered 2023-01-17: 20 meq via INTRAVENOUS
  Filled 2023-01-17 (×3): qty 1000

## 2023-01-17 NOTE — Progress Notes (Signed)
Progress Note   Patient: Peggy [name] WUJ:[id number] DOB: 1929/01/20 DOA: 01/16/2023     1 DOS: the patient was seen and examined on 01/17/2023   Brief hospital course: No notes on file  Assessment and Plan: Acute kidney injury on CKD  Baseline Cr 1.3-1.7 IV fluid hydration with gradual improvement of kidney function. Avoid nephrotoxic medications Bladder scan and foley if evidence of urinary retention   Atrial arrhythmia Elevated trop No evidence of Afib per cardiology team. Arrhythmia possibly due to hypokalemia. Elevated troponin likely demand. No further cardiac work up. Echo shows low EF, follow cardiology recommendations,. Continue Coreg, statin, aspirin   Hypoxia 2/2 COVID Continue supportive care.  O2, nebs PRN. COVID isolation precautions   Severe Hypokalemia Continue oral and IV replacement Mag level 1.9, recheck BMP in evening. Hold Lasix for now. Continue aggressive potassium replacement. Telemetry monitoring.   Fall No fractures Continue fall precautions Encourage incentive spirometry PT eval and treat   History of anemia, stable Iron, B12, folate levels ok. No active bleeding. Monitor daily CBC.   History of HTN Continue Continue Norvasc, Lasix, metolazone   History of GERD Continue Protonix       Subjective: Patient is seen and examined today morning. She is lying in bed, RN at bedside giving her meds. Feels better, able to answer me appropriately.   Physical Exam: Vitals:   01/17/23 0247 01/17/23 0636 01/17/23 0938 01/17/23 1504  BP: 139/86 122/73 (!) 140/89 (!) 140/80  Pulse: 79 (!) 56 90 77  Resp: 19 20  (!) 22  Temp: 99.2 F (37.3 C) 97.9 F (36.6 C) 98 F (36.7 C) 98.2 F (36.8 C)  TempSrc: Oral Oral Oral Oral  SpO2: 96% 95% 98% 93%  Weight:      Height:       General - Elderly Caucasian female,  no apparent distress HEENT - PERRLA, EOMI, atraumatic head, non tender sinuses. Lung - Clear, diffuse rales, rhochi Heart  - S1, S2 heard, no murmurs, rubs, trace pedal edema. Abdomen soft, nontender, ostomy bag noted Neuro - alert, awake, oriented, non focal exam. Skin - Warm and dry. Data Reviewed:     Latest Ref Rng & Units 01/17/2023    4:03 AM 01/16/2023   11:25 AM 12/30/2022    7:15 AM  CBC  WBC 4.0 - 10.5 K/uL 6.5  8.0  8.5   Hemoglobin 12.0 - 15.0 g/dL 9.8  95.6  21.3   Hematocrit 36.0 - 46.0 % 30.2  31.8  30.4   Platelets 150 - 400 K/uL 318  385  201        Latest Ref Rng & Units 01/17/2023    4:03 AM 01/16/2023    8:32 PM 01/16/2023   11:25 AM  BMP  Glucose 70 - 99 mg/dL 93  086  97   BUN 8 - 23 mg/dL 34  36  40   Creatinine 0.44 - 1.00 mg/dL 5.78  4.69  6.29   Sodium 135 - 145 mmol/L 132  130  132   Potassium 3.5 - 5.1 mmol/L 2.8  <2.0  2.2   Chloride 98 - 111 mmol/L 91  87  82   CO2 22 - 32 mmol/L 29  30  37   Calcium 8.9 - 10.3 mg/dL 9.0  9.1  52.8     ECHOCARDIOGRAM COMPLETE  Result Date: 01/17/2023    ECHOCARDIOGRAM REPORT   Patient Name:   Peggy [name] Date of Exam: 01/17/2023 Medical Rec #:  [id number]          Height:       65.5 in Accession #:    [id number]         Weight:       120.4 lb Date of Birth:  [birth year]         BSA:          1.603 m Patient Age:    89 years           BP:           122/73 mmHg Patient Gender: F                  HR:           78 bpm. Exam Location:  Inpatient Procedure: 2D Echo, Cardiac Doppler and Color Doppler Indications:    I48.92* Unspecified atrial flutter  History:        Patient has no prior history of Echocardiogram examinations.                 Risk Factors:Hypertension.  Sonographer:    Harriette Bouillon RDCS Referring Phys: [id number] ALEXIS HUGELMEYER IMPRESSIONS  1. LVEF challenging with atrial flutter. Left ventricular ejection fraction, by estimation, is 40 to 45%. The left ventricle has mildly decreased function. The left ventricle demonstrates global hypokinesis. Left ventricular diastolic parameters are consistent with Grade I diastolic dysfunction  (impaired relaxation).  2. Right ventricular systolic function was not well visualized. The right ventricular size is not well visualized. There is normal pulmonary artery systolic pressure.  3. Left atrial size was moderately dilated.  4. Right atrial size was moderately dilated.  5. The mitral valve is degenerative. Trivial mitral valve regurgitation.  6. The aortic valve was not well visualized. Aortic valve regurgitation is mild.  7. There is mild dilatation of the aortic root, measuring 40 mm. FINDINGS  Left Ventricle: LVEF challenging with atrial flutter. Left ventricular ejection fraction, by estimation, is 40 to 45%. The left ventricle has mildly decreased function. The left ventricle demonstrates global hypokinesis. The left ventricular internal cavity size was normal in size. There is borderline asymmetric left ventricular hypertrophy of the basal-septal segment. Left ventricular diastolic parameters are consistent with Grade I diastolic dysfunction (impaired relaxation). Right Ventricle: The right ventricular size is not well visualized. Right ventricular systolic function was not well visualized. There is normal pulmonary artery systolic pressure. The tricuspid regurgitant velocity is 2.02 m/s, and with an assumed right  atrial pressure of 3 mmHg, the estimated right ventricular systolic pressure is 19.3 mmHg. Left Atrium: Left atrial size was moderately dilated. Right Atrium: Right atrial size was moderately dilated. Pericardium: There is no evidence of pericardial effusion. Mitral Valve: The mitral valve is degenerative in appearance. Mild to moderate mitral annular calcification. Trivial mitral valve regurgitation. Tricuspid Valve: Tricuspid valve regurgitation is trivial. Aortic Valve: The aortic valve was not well visualized. Aortic valve regurgitation is mild. Pulmonic Valve: Pulmonic valve regurgitation is not visualized. Aorta: The aortic root and ascending aorta are structurally normal, with no  evidence of dilitation. There is mild dilatation of the aortic root, measuring 40 mm. IAS/Shunts: The interatrial septum appears to be lipomatous. The interatrial septum was not well visualized.  LEFT VENTRICLE PLAX 2D LVIDd:         5.10 cm LVIDs:         3.90 cm LV PW:         0.90 cm LV IVS:  0.90 cm LVOT diam:     2.10 cm LV SV:         49 LV SV Index:   30 LVOT Area:     3.46 cm  RIGHT VENTRICLE         IVC TAPSE (M-mode): 1.4 cm  IVC diam: 1.20 cm LEFT ATRIUM             Index LA diam:        3.40 cm 2.12 cm/m LA Vol (A2C):   28.8 ml 17.96 ml/m LA Vol (A4C):   57.6 ml 35.93 ml/m LA Biplane Vol: 42.1 ml 26.26 ml/m  AORTIC VALVE LVOT Vmax:   85.33 cm/s LVOT Vmean:  56.333 cm/s LVOT VTI:    0.141 m  AORTA Ao Root diam: 4.00 cm Ao Asc diam:  3.50 cm MITRAL VALVE                TRICUSPID VALVE MV Area (PHT): 3.99 cm     TR Peak grad:   16.3 mmHg MV Decel Time: 190 msec     TR Vmax:        202.00 cm/s MV E velocity: 49.00 cm/s MV A velocity: 109.00 cm/s  SHUNTS MV E/A ratio:  0.45         Systemic VTI:  0.14 m                             Systemic Diam: 2.10 cm Carolan Clines Electronically signed by Carolan Clines Signature Date/Time: 01/17/2023/12:52:38 PM    Final     Family Communication: Patient's sister called to update about her current care, left voice mail.  Disposition: Status is: Inpatient Remains inpatient appropriate because: severe hypokalemia, arrhythmia  Planned Discharge Destination:  assisted living    Time spent: 46 minutes  Author: Marcelino Duster, MD 01/17/2023 4:10 PM  For on call review [email or web address].

## 2023-01-17 NOTE — Plan of Care (Signed)
  Problem: Health Behavior/Discharge Planning: Goal: Ability to manage health-related needs will improve Outcome: Progressing   Problem: Clinical Measurements: Goal: Respiratory complications will improve Outcome: Progressing Goal: Cardiovascular complication will be avoided Outcome: Progressing   Problem: Activity: Goal: Risk for activity intolerance will decrease Outcome: Progressing   Problem: Nutrition: Goal: Adequate nutrition will be maintained Outcome: Progressing   Problem: Coping: Goal: Level of anxiety will decrease Outcome: Progressing   Problem: Elimination: Goal: Will not experience complications related to urinary retention Outcome: Progressing   Problem: Pain Managment: Goal: General experience of comfort will improve Outcome: Progressing

## 2023-01-17 NOTE — TOC Initial Note (Addendum)
Transition of Care [facility]) - Initial/Assessment Note    Patient Details  Name: Peggy [name] MRN: [id number] Date of Birth: 30-Apr-1929  Transition of Care [facility]) CM/SW Contact:    Peggy Kass, LCSW Phone Number: 01/17/2023, 3:09 PM  Clinical Narrative:                 Pt is from [facility], CSW attempted to call pt's daughter , left HIPAA complaint VM requesting a return call.   CSW received a call from Peggy [name] with [facility] inquiring about recommendations. She reports pt's daughter sent her an email requesting SNF placement. Pt will need a 3 night inpatient stay to qualify for SNF placement. TOC to follow.    Adden 4:40pm CSW received call from pt's daughter Peggy [name] , she has agreed to SNF placement. CSW explained SNF process and pt's need for an 3 night stay. Pt's daughter has chosen [facility] for SNF placement. TOC to follow.   Expected Discharge Plan: Skilled Nursing Facility Barriers to Discharge: Continued Medical Work up   Patient Goals and CMS Choice            Expected Discharge Plan and Services       Living arrangements for the past 2 months: Independent Living Facility                                      Prior Living Arrangements/Services Living arrangements for the past 2 months: Independent Living Facility Lives with:: Self Patient language and need for interpreter reviewed:: Yes        Need for Family Participation in Patient Care: Yes (Comment) Care giver support system in place?: Yes (comment)   Criminal Activity/Legal Involvement Pertinent to Current Situation/Hospitalization: No - Comment as needed  Activities of Daily Living Home Assistive Devices/Equipment: Environmental consultant (specify type), Eyeglasses, Cane (specify quad or straight) ADL Screening (condition at time of admission) Patient's cognitive ability adequate to safely complete daily activities?: No Is the patient deaf or have difficulty  hearing?: Yes Does the patient have difficulty seeing, even when wearing glasses/contacts?: Yes Does the patient have difficulty concentrating, remembering, or making decisions?: Yes Patient able to express need for assistance with ADLs?: Yes Does the patient have difficulty dressing or bathing?: No Independently performs ADLs?: Yes (appropriate for developmental age) (able to piror to admission) Does the patient have difficulty walking or climbing stairs?: Yes Weakness of Legs: Both Weakness of Arms/Hands: None  Permission Sought/Granted                  Emotional Assessment Appearance:: Appears stated age            Admission diagnosis:  Hypokalemia [E87.6] A-fib (HCC) [I48.91] Hypoxia [R09.02] AKI (acute kidney injury) (HCC) [N17.9] Fall, initial encounter [W19.XXXA] Skin tear of left forearm without complication, initial encounter [S51.812A] Atrial fibrillation, unspecified type [facility]) [I48.91] Patient Active Problem List   Diagnosis Date Noted   A-fib (HCC) 01/16/2023   AKI (acute kidney injury) (HCC) 01/16/2023   Hypokalemia 01/16/2023   Hypoxia 01/16/2023   Bronchitis 09/30/2022   Hematoma of right lower leg 09/08/2022   Protein-calorie malnutrition, severe 12/02/2021   Closed right hip fracture (HCC) 11/30/2021   DNR (do not resuscitate) 11/30/2021   Osteoarthritis, multiple sites 09/24/2021   UTI (urinary tract infection) 07/30/2021   Dry skin 06/18/2021   Boxer's fracture 02/04/2021   Closed right  ankle fracture 12/25/2020   Noninfected skin tear of leg, left, subsequent encounter 12/25/2020   Rash 12/23/2020   Pemphigoid 12/16/2020   Osteoporosis 11/28/2020   Slow transit constipation 02/21/2020   Gait abnormality 08/31/2018   Fecal incontinence 08/31/2018   Loose stools 08/31/2018   Right shoulder pain 03/30/2018   Closed nondisp fracture of right medial malleolus with routine healing 11/10/2017   Anemia 11/10/2017   CKD (chronic kidney disease)  stage 4, GFR 15-29 ml/min (HCC) 10/11/2017   Venous insufficiency (chronic) (peripheral) 08/10/2017   Incontinent of urine 08/10/2017   Malnutrition of moderate degree 05/19/2016   Hypertension 05/18/2016   Nevus of [place] 06/21/2011   Ptosis of eyebrow 06/21/2011   PCP:  Mast, Man X, NP Pharmacy:   [facility] [place], [place] - [address] [place] [address] Phone: [phone] Fax: [phone]     Social Determinants of Health (SDOH) Social History: SDOH Screenings   Food Insecurity: No Food Insecurity (01/16/2023)  Housing: Low Risk  (01/16/2023)  Transportation Needs: No Transportation Needs (01/16/2023)  Utilities: Not At Risk (01/16/2023)  Depression (PHQ2-9): Low Risk  (09/23/2022)  Financial Resource Strain: Low Risk  (10/20/2017)  Physical Activity: Inactive (10/20/2017)  Social Connections: Somewhat Isolated (10/20/2017)  Stress: No Stress Concern Present (10/20/2017)  Tobacco Use: Medium Risk (01/16/2023)   SDOH Interventions:     Readmission Risk Interventions     No data to display

## 2023-01-17 NOTE — Evaluation (Signed)
Physical Therapy Evaluation Patient Details Name: Peggy [name] MRN: [id number] DOB: 02-Feb-1929 Today's Date: 01/17/2023  History of Present Illness  89 yo female admitted with COVID, AKI, Afib after falling at home. Hx of CKD, R ankle fx 01/2021, THA 2023, anemia  Clinical Impression  On eval, pt was Mod A for mobility. She was able to stand, take a few steps, and step over to the recliner using a RW. Unfortunately, ultrasound tech arrived and pt had to be assisted back to bed. Pt remained on [place] O2 during session. She reported L side pain that caused grimacing and guarding. No family was present during session. Pt stated she is from [facility] but chart indicates assisted living??? Patient will benefit from continued inpatient follow up therapy, <3 hours/day.         If plan is discharge home, recommend the following: A little help with walking and/or transfers;A little help with bathing/dressing/bathroom;Assistance with cooking/housework;Assist for transportation;Help with stairs or ramp for entrance   Can travel by private vehicle   Yes    Equipment Recommendations None recommended by PT  Recommendations for Other Services       Functional Status Assessment Patient has had a recent decline in their functional status and demonstrates the ability to make significant improvements in function in a reasonable and predictable amount of time.     Precautions / Restrictions Precautions Precautions: Fall Restrictions Weight Bearing Restrictions: No      Mobility  Bed Mobility Overal bed mobility: Needs Assistance Bed Mobility: Supine to Sit, Sit to Supine     Supine to sit: Mod assist Sit to supine: Mod assist   General bed mobility comments: Assist for trunk and LEs. Pt reports pain L side when attempting to move. Increased time. Cues provided.    Transfers Overall transfer level: Needs assistance Equipment used: Rolling walker (2 wheels) Transfers: Sit  to/from Stand, Bed to chair/wheelchair/BSC Sit to Stand: Min assist   Step pivot transfers: Min assist       General transfer comment: Assist to power up, stabilize, control descent. Increased time. Cues required. Remained on  O2. Denied dizziness.    Ambulation/Gait               General Gait Details: Nt on today-cardiac ultrasound arrived to perform testing so assisted pt back to bed  Stairs            Wheelchair Mobility     Tilt Bed    Modified Rankin (Stroke Patients Only)       Balance Overall balance assessment: Needs assistance, History of Falls         Standing balance support: Bilateral upper extremity supported, Reliant on assistive device for balance, During functional activity Standing balance-Leahy Scale: Poor                               Pertinent Vitals/Pain Pain Assessment Pain Assessment: Faces Faces Pain Scale: Hurts even more Pain Location: L side Pain Descriptors / Indicators: Guarding, Grimacing Pain Intervention(s): Limited activity within patient's tolerance, Monitored during session, Repositioned    Home Living Family/patient expects to be discharged to:: Private residence     Type of Home:  ([facility]) Home Access: Level entry       Home Layout: One level Home Equipment: Rollator (4 wheels)      Prior Function Prior Level of Function : Independent/Modified Independent  Mobility Comments: using rollator for ambulation ADLs Comments: pt reports she perfomred ADLs unassisted     Hand Dominance        Extremity/Trunk Assessment   Upper Extremity Assessment Upper Extremity Assessment: Defer to OT evaluation    Lower Extremity Assessment Lower Extremity Assessment: Generalized weakness    Cervical / Trunk Assessment Cervical / Trunk Assessment: Kyphotic  Communication   Communication: No difficulties  Cognition Arousal/Alertness: Awake/alert Behavior During Therapy: WFL  for tasks assessed/performed Overall Cognitive Status: No family/caregiver present to determine baseline cognitive functioning                                 General Comments: unable to state location. generally confused        General Comments      Exercises     Assessment/Plan    PT Assessment Patient needs continued PT services  PT Problem List Decreased strength;Decreased activity tolerance;Decreased balance;Decreased mobility;Decreased knowledge of use of DME;Decreased cognition;Decreased safety awareness;Pain       PT Treatment Interventions DME instruction;Therapeutic exercise;Gait training;Balance training;Functional mobility training;Therapeutic activities;Patient/family education    PT Goals (Current goals can be found in the Care Plan section)  Acute Rehab PT Goals Patient Stated Goal: to go back to [facility] PT Goal Formulation: With patient Time For Goal Achievement: 01/31/23 Potential to Achieve Goals: Good    Frequency Min 1X/week     Co-evaluation               AM-PAC PT "6 Clicks" Mobility  Outcome Measure Help needed turning from your back to your side while in a flat bed without using bedrails?: A Little Help needed moving from lying on your back to sitting on the side of a flat bed without using bedrails?: A Lot Help needed moving to and from a bed to a chair (including a wheelchair)?: A Little Help needed standing up from a chair using your arms (e.g., wheelchair or bedside chair)?: A Little Help needed to walk in hospital room?: A Little Help needed climbing 3-5 steps with a railing? : A Lot 6 Click Score: 16    End of Session Equipment Utilized During Treatment: Oxygen;Gait belt Activity Tolerance: Patient tolerated treatment well;Patient limited by pain Patient left: in bed;with call bell/phone within reach (with cardiac ultrasound tech)   PT Visit Diagnosis: History of falling (Z91.81);Muscle weakness (generalized)  (M62.81)    Time: 1610-9604 PT Time Calculation (min) (ACUTE ONLY): 30 min   Charges:   PT Evaluation $PT Eval Low Complexity: 1 Low PT Treatments $Therapeutic Activity: 8-22 mins PT General Charges $$ ACUTE PT VISIT: 1 Visit            Faye Ramsay, PT Acute Rehabilitation  Office: [phone]

## 2023-01-17 NOTE — Consult Note (Signed)
Cardiology Consultation   Patient ID: LATARCHA PREAST MRN: [id number]; DOB: 01-Mar-1929  Admit date: 01/16/2023 Date of Consult: 01/17/2023  PCP:  Mast, Man X, NP   [facility] Providers Cardiologist:  None        Patient Profile:   Peggy [name] is a 89 y.o. female with a hx of anemia, chronic kidney disease, gastroesophageal reflux disease, hypertension, chronic venous insufficiency who is being seen 01/17/2023 for the evaluation of new atrial fibrillation at the request of Dr. Emmit Pomfret.  History of Present Illness:   Peggy [name] is a resident of an assisted living facility and presented to the [facility] ED for evaluation after an unwitnessed fall.  At the time of her evaluation, patient found to be hypoxic and tachycardic.  Per emergency department note, rhythm felt to be a new onset atrial fibrillation.  Initial ED workup notable for potassium of 2.2, creatinine of 2.2, BNP 265, high-sensitivity troponin 178->178.  Respiratory virus panel positive for COVID-19.  Patient was treated for acute hypokalemia and admitted for further evaluation and management of atrial fibrillation, hypoxia, COVID-19.   On exam, patient pleasantly confused and had limited ability to contribute to HPI. Denies focal symptoms this morning.  Past Medical History:  Diagnosis Date   Anemia    Chronic kidney disease    stage 3   GERD (gastroesophageal reflux disease)    Hypertension     Past Surgical History:  Procedure Laterality Date   ORIF ANKLE FRACTURE Right 01/13/2021   Procedure: OPEN TREATMENT OF RIGHT TRIMALLEOLAR ANKLE FRACTURE WITHOUT POSTERIOR FIXATION, POSSIBLE SYNDESMOSIS;  Surgeon: Terance Hart, MD;  Location: [facility] OR;  Service: Orthopedics;  Laterality: Right;   TOTAL HIP ARTHROPLASTY Right 12/02/2021   Procedure: TOTAL HIP ARTHROPLASTY;  Surgeon: Joen Laura, MD;  Location: [facility] ORS;  Service: Orthopedics;  Laterality: Right;     Home Medications:   Prior to Admission medications   Medication Sig Start Date End Date Taking? Authorizing Provider  acetaminophen (TYLENOL) 325 MG tablet Take 2 tablets (650 mg total) by mouth every 6 (six) hours as needed for mild pain, fever or headache. 12/04/21  Yes Sharl Ma, Sarina Ill, MD  amLODipine (NORVASC) 5 MG tablet Take 1 tablet (5 mg total) by mouth daily. 08/19/22  Yes Mast, Man X, NP  aspirin EC 81 MG tablet Take 81 mg by mouth daily. Monday, Wednesday and Friday   Yes [provider]  cholecalciferol (VITAMIN D3) 25 MCG (1000 UNIT) tablet Take 1,000 Units by mouth every Monday, Wednesday, and Friday.   Yes [provider]  furosemide (LASIX) 20 MG tablet Take 2 tablets (40 mg total) by mouth daily. 12/09/22  Yes Mast, Man X, NP  metolazone (ZAROXOLYN) 2.5 MG tablet Take 1 tablet (2.5 mg total) by mouth every other day. 01/01/23  Yes Wert, Trula Ore, NP  Multiple Vitamins-Minerals (ICAPS AREDS 2 PO) Take 1 capsule by mouth daily with breakfast.   Yes [provider]  MYRBETRIQ 25 MG TB24 tablet TAKE 2 TABLETS BY MOUTH ONCE  DAILY 04/09/22  Yes Mahlon Gammon, MD  pantoprazole (PROTONIX) 40 MG tablet Take 1 tablet (40 mg total) by mouth daily. 07/12/22  Yes Mast, Man X, NP  predniSONE (DELTASONE) 10 MG tablet Take 10 mg by mouth 2 (two) times daily. 12/08/22  Yes [provider]  vitamin C (ASCORBIC ACID) 250 MG tablet Take 1 tablet (250 mg total) by mouth daily. 04/13/21  Yes Mast, Man X, NP  betamethasone  dipropionate 0.05 % cream Apply topically. Patient not taking: Reported on 01/17/2023 11/18/22   [provider]  doxycycline (VIBRAMYCIN) 50 MG capsule Take 50 mg by mouth 2 (two) times daily. Patient not taking: Reported on 01/17/2023 12/08/22   [provider]  ferrous sulfate (FEROSUL) 325 (65 FE) MG tablet Take 1 tablet (325 mg total) by mouth daily with breakfast. MON, WED, FRI Patient not taking: Reported on 01/17/2023 12/15/22   Mast, Man X, NP  furosemide  (LASIX) 20 MG tablet Take 1 tablet (20 mg total) by mouth daily. Patient not taking: Reported on 01/17/2023 11/05/22   Octavia Heir, NP  mupirocin ointment (BACTROBAN) 2 % Apply 1 Application topically 2 (two) times daily. Patient not taking: Reported on 01/17/2023 01/01/23   Fletcher Anon, NP    Inpatient Medications: Scheduled Meds:  amLODipine  5 mg Oral Daily   apixaban  2.5 mg Oral BID   vitamin C  250 mg Oral Daily   atorvastatin  80 mg Oral Daily   carvedilol  3.125 mg Oral BID WC   cholecalciferol  4,000 Units Oral Weekly   ferrous sulfate  325 mg Oral Once per day on Monday Wednesday Friday   furosemide  40 mg Oral Daily   melatonin  3 mg Oral QHS   metolazone  2.5 mg Oral QODAY   mirabegron ER  50 mg Oral Daily   pantoprazole  40 mg Oral Daily   potassium chloride  40 mEq Oral TID   sodium chloride flush  3 mL Intravenous Q12H   Continuous Infusions:  sodium chloride 50 mL/hr at 01/16/23 1956   PRN Meds: acetaminophen **OR** acetaminophen, albuterol, benzonatate, bisacodyl, hydrALAZINE, ipratropium, ondansetron **OR** ondansetron (ZOFRAN) IV, mouth rinse, senna-docusate, traMADol  Allergies:    Allergies  Allergen Reactions   Penicillins Swelling and Other (See Comments)    Facial swelling; can tolerate cephalosporins    Ace Inhibitors Other (See Comments)    Reaction unknown   Flagyl [Metronidazole] Other (See Comments)    Reaction unknown    Social History:   Social History   Socioeconomic History   Marital status: Widowed    Spouse name: Adela Lank   Number of children: 2   Years of education: Not on file   Highest education level: Not on file  Occupational History   Occupation: Runner, broadcasting/film/video    Comment: retired  Tobacco Use   Smoking status: Former    Current packs/day: 0.00    Types: Cigarettes    Start date: 06/14/1945    Quit date: 06/14/1957    Years since quitting: 65.6   Smokeless tobacco: Never  Vaping Use   Vaping status: Never Used  Substance and  Sexual Activity   Alcohol use: Not Currently   Drug use: No   Sexual activity: Not on file  Other Topics Concern   Not on file  Social History Narrative   Social History     Socioeconomic History       Marital status: Married   09/01/1954       Spouse name: Adela Lank   Two people stay in the home on the fourth floor       Number of children: 2       Years of education:       Highest education level: Not on file    Exercise: yoga, walking     Social Needs       Financial resource strain: Not on file  Food insecurity - worry: Not on file       Food insecurity - inability: Not on file       Transportation needs - medical: Not on file       Transportation needs - non-medical: Not on file     Occupational History       Not on file     Tobacco Use       Smoking status: Never Smoker       Smokeless tobacco: Never Used     Substance and Sexual Activity       Alcohol use: No       Drug use: No       Sexual activity: Not on file     Other Topics       Concerns:         Not on file     Social History Narrative       Not on file   Social Determinants of Health   Financial Resource Strain: Low Risk  (10/20/2017)   Overall Financial Resource Strain (CARDIA)    Difficulty of Paying Living Expenses: Not hard at all  Food Insecurity: No Food Insecurity (01/16/2023)   Hunger Vital Sign    Worried About Running Out of Food in the Last Year: Never true    Ran Out of Food in the Last Year: Never true  Transportation Needs: No Transportation Needs (01/16/2023)   PRAPARE - Administrator, Civil Service (Medical): No    Lack of Transportation (Non-Medical): No  Physical Activity: Inactive (10/20/2017)   Exercise Vital Sign    Days of Exercise per Week: 0 days    Minutes of Exercise per Session: 0 min  Stress: No Stress Concern Present (10/20/2017)   [employer] of Occupational Health - Occupational Stress Questionnaire    Feeling of Stress : Only a little  Social Connections:  Somewhat Isolated (10/20/2017)   Social Connection and Isolation Panel [NHANES]    Frequency of Communication with Friends and Family: More than three times a week    Frequency of Social Gatherings with Friends and Family: More than three times a week    Attends Religious Services: Never    Database administrator or Organizations: No    Attends Banker Meetings: Never    Marital Status: Married  Catering manager Violence: Not At Risk (01/16/2023)   Humiliation, Afraid, Rape, and Kick questionnaire    Fear of Current or Ex-Partner: No    Emotionally Abused: No    Physically Abused: No    Sexually Abused: No    Family History:    Family History  Problem Relation Age of Onset   Heart failure Mother    Heart disease Father    Arthritis Sister      ROS:  Please see the history of present illness.   All other ROS reviewed and negative.     Physical Exam/Data:   Vitals:   01/16/23 2100 01/17/23 0247 01/17/23 0636 01/17/23 0938  BP: 138/68 139/86 122/73 (!) 140/89  Pulse: 100 79 (!) 56 90  Resp:  19 20   Temp: 97.9 F (36.6 C) 99.2 F (37.3 C) 97.9 F (36.6 C) 98 F (36.7 C)  TempSrc: Oral Oral Oral Oral  SpO2: 94% 96% 95% 98%  Weight:      Height:        Intake/Output Summary (Last 24 hours) at 01/17/2023 1023 Last data filed at 01/17/2023 1610 Gross  per 24 hour  Intake 1318.08 ml  Output 600 ml  Net 718.08 ml      01/16/2023    6:18 PM 12/29/2022    1:40 PM 12/09/2022    2:44 PM  Last 3 Weights  Weight (lbs) 120 lb 5.9 oz 130 lb 132 lb  Weight (kg) 54.6 kg 58.968 kg 59.875 kg     Body mass index is 19.73 kg/m.  General:  frail appearing. Pleasantly confused. HEENT: normal Neck: no JVD Vascular: No carotid bruits; Distal pulses 2+ bilaterally Cardiac:  normal S1, S2; irregularly irregular; no murmur Lungs:  clear to auscultation bilaterally, no wheezing, rhonchi or rales  Abd: soft, nontender, no hepatomegaly  Ext: no edema Musculoskeletal:  No  deformities, BUE and BLE strength normal and equal Skin: warm and dry  Neuro:  CNs 2-12 intact, no focal abnormalities noted Psych:  Pleasantly confused.  EKG:  The EKG was personally reviewed and demonstrates: Only 1 ECG tracing is available from 8/4.  This does not show atrial fibrillation but rather sinus rhythm with PACs and PVCs.    Telemetry:  Telemetry was personally reviewed and demonstrates:  sinus arrhythmia/MAT with baseline artifact. Frequent PACs/PVCs.  Relevant CV Studies:  No prior echocardiogram or stress test studies available review.  Laboratory Data:  High Sensitivity Troponin:   Recent Labs  Lab 01/16/23 1833 01/16/23 2032  TROPONINIHS 178* 178*     Chemistry Recent Labs  Lab 01/16/23 1125 01/16/23 2032 01/17/23 0403  NA 132* 130* 132*  K 2.2* <2.0* 2.8*  CL 82* 87* 91*  CO2 37* 30 29  GLUCOSE 97 106* 93  BUN 40* 36* 34*  CREATININE 2.20* 1.95* 1.89*  CALCIUM 10.1 9.1 9.0  MG  --  1.9  --   GFRNONAA 21* 24* 25*  ANIONGAP 13 13 12     Recent Labs  Lab 01/16/23 1125 01/17/23 0403  PROT 6.7 5.5*  ALBUMIN 4.0 3.0*  AST 30 31  ALT 13 17  ALKPHOS 65 54  BILITOT 0.5 0.6   Lipids  Recent Labs  Lab 01/16/23 1833  CHOL 174  TRIG 69  HDL 40*  LDLCALC 120*  CHOLHDL 4.4    Hematology Recent Labs  Lab 01/16/23 1125 01/17/23 0403  WBC 8.0 6.5  RBC 3.39* 3.06*  HGB 10.9* 9.8*  HCT 31.8* 30.2*  MCV 93.8 98.7  MCH 32.2 32.0  MCHC 34.3 32.5  RDW 13.2 13.2  PLT 385 318   Thyroid  Recent Labs  Lab 01/16/23 1833  TSH 0.862    BNP Recent Labs  Lab 01/16/23 1833  BNP 265.0*    DDimer No results for input(s): "DDIMER" in the last 168 hours.   Radiology/Studies:  DG Forearm Left  Result Date: 01/16/2023 CLINICAL DATA:  Fall.  Pain. EXAM: LEFT FOREARM - 2 VIEW COMPARISON:  None Available. FINDINGS: There is no evidence of fracture or other focal bone lesions. Soft tissues are unremarkable. IMPRESSION: Negative. Electronically  Signed   By: Paulina Fusi M.D.   On: 01/16/2023 12:10   DG Hips Bilat W or Wo Pelvis 3-4 Views  Result Date: 01/16/2023 CLINICAL DATA:  Larey Seat.  Hip and pelvic pain. EXAM: DG HIP (WITH OR WITHOUT PELVIS) 3-4V BILAT COMPARISON:  12/02/2021 FINDINGS: Previous hip replacement on the right. No unexpected finding relative to that. No evidence of pelvic fracture. The left hip shows mild degenerative arthritis but no acute traumatic finding. Calcified leiomyoma incidentally noted in the central pelvis. IMPRESSION: No acute or traumatic  finding. Previous right hip replacement. Electronically Signed   By: Paulina Fusi M.D.   On: 01/16/2023 12:09   DG Ribs Unilateral W/Chest Left  Result Date: 01/16/2023 CLINICAL DATA:  Fall with left arm pain and left chest pain EXAM: LEFT RIBS AND CHEST - 3+ VIEW COMPARISON:  11/30/2021 FINDINGS: Hiatal hernia. Heart size is normal. Tortuous aorta. The lungs are clear. No pneumothorax or hemothorax. Left rib films do not show a fracture. Chronic degenerative changes affect the shoulders. Scoliosis of the spine. IMPRESSION: No acute or traumatic finding. Hiatal hernia. Tortuous aorta. Scoliosis. Chronic degenerative changes of the shoulders. Electronically Signed   By: Paulina Fusi M.D.   On: 01/16/2023 12:08   CT Head Wo Contrast  Result Date: 01/16/2023 CLINICAL DATA:  Fall out of bed with head and neck pain. EXAM: CT HEAD WITHOUT CONTRAST CT CERVICAL SPINE WITHOUT CONTRAST TECHNIQUE: Multidetector CT imaging of the head and cervical spine was performed following the standard protocol without intravenous contrast. Multiplanar CT image reconstructions of the cervical spine were also generated. RADIATION DOSE REDUCTION: This exam was performed according to the departmental dose-optimization program which includes automated exposure control, adjustment of the mA and/or kV according to patient size and/or use of iterative reconstruction technique. COMPARISON:  None Available. FINDINGS:  CT HEAD FINDINGS Brain: No evidence of acute infarction, hemorrhage, hydrocephalus, extra-axial collection or mass lesion/mass effect. There is mild cerebral volume loss with associated ex vacuo dilatation. Periventricular white matter hypoattenuation likely represents chronic small vessel ischemic disease. Vascular: There are vascular calcifications in the carotid siphons. Skull: Normal. Negative for fracture or focal lesion. Sinuses/Orbits: No acute finding. Other: None. CT CERVICAL SPINE FINDINGS Alignment: No traumatic listhesis. Skull base and vertebrae: No acute fracture. No primary bone lesion or focal pathologic process. Soft tissues and spinal canal: No prevertebral fluid or swelling. No visible canal hematoma. Disc levels: Moderate to severe multilevel degenerative disc and joint disease. Upper chest: Negative. Other: None. IMPRESSION: 1. No acute intracranial process. 2. No acute osseous injury in the cervical spine. Electronically Signed   By: Romona Curls M.D.   On: 01/16/2023 11:53   CT Cervical Spine Wo Contrast  Result Date: 01/16/2023 CLINICAL DATA:  Fall out of bed with head and neck pain. EXAM: CT HEAD WITHOUT CONTRAST CT CERVICAL SPINE WITHOUT CONTRAST TECHNIQUE: Multidetector CT imaging of the head and cervical spine was performed following the standard protocol without intravenous contrast. Multiplanar CT image reconstructions of the cervical spine were also generated. RADIATION DOSE REDUCTION: This exam was performed according to the departmental dose-optimization program which includes automated exposure control, adjustment of the mA and/or kV according to patient size and/or use of iterative reconstruction technique. COMPARISON:  None Available. FINDINGS: CT HEAD FINDINGS Brain: No evidence of acute infarction, hemorrhage, hydrocephalus, extra-axial collection or mass lesion/mass effect. There is mild cerebral volume loss with associated ex vacuo dilatation. Periventricular white matter  hypoattenuation likely represents chronic small vessel ischemic disease. Vascular: There are vascular calcifications in the carotid siphons. Skull: Normal. Negative for fracture or focal lesion. Sinuses/Orbits: No acute finding. Other: None. CT CERVICAL SPINE FINDINGS Alignment: No traumatic listhesis. Skull base and vertebrae: No acute fracture. No primary bone lesion or focal pathologic process. Soft tissues and spinal canal: No prevertebral fluid or swelling. No visible canal hematoma. Disc levels: Moderate to severe multilevel degenerative disc and joint disease. Upper chest: Negative. Other: None. IMPRESSION: 1. No acute intracranial process. 2. No acute osseous injury in the cervical spine. Electronically Signed  By: Romona Curls M.D.   On: 01/16/2023 11:53     Assessment and Plan:   Reported paroxysmal atrial fibrillation  Per ED notes, patient noted to have atrial fibrillation in triage.  Unable to view tracings or telemetry from this time.  Subsequent ECG upon admission to [facility] does not show atrial fibrillation but rather MAT with PACs and PVCs.  Very likely that MAT triggered by notable electrolyte derangements and anoxia with COVID-19. K remains low today at 2.8, will replace. Goal K>4, Mg>2. Echocardiogram pending Given lack of evidence of afib combined with patient's age/frailty, will discontinue Eliquis.  Elevated troponin  Patient admitted s/p fall with COVID-19, hypoxia, and reported new paroxysmal afib found to have troponin 178->178.  Troponin elevation not consistent with ACS but rather type II/demand ischemia event.  Will follow echocardiogram today  Hypoxia  Patient with hypoxia in setting of COVID-19+ status. BNP 265.0 but no obvious volume overload on physical exam. She takes lasix at baseline for chronic venous insufficiency/edema per chart review. Reasonable to continue home Lasix dosing. If TTE indicates HFpEF, may need GDMT adjustment.  Risk Assessment/Risk  Scores:              For questions or updates, please contact [facility] Please consult [email or web address] for contact info under    Signed, Perlie Gold, PA-C  01/17/2023 10:23 AM

## 2023-01-17 NOTE — Progress Notes (Signed)
  Echocardiogram 2D Echocardiogram has been performed.  Leda Roys RDCS 01/17/2023, 11:56 AM

## 2023-01-17 NOTE — Plan of Care (Signed)
  Problem: Health Behavior/Discharge Planning: Goal: Ability to manage health-related needs will improve Outcome: Progressing   Problem: Clinical Measurements: Goal: Respiratory complications will improve Outcome: Progressing   Problem: Nutrition: Goal: Adequate nutrition will be maintained Outcome: Progressing   Problem: Coping: Goal: Level of anxiety will decrease Outcome: Progressing   Problem: Pain Managment: Goal: General experience of comfort will improve Outcome: Progressing

## 2023-01-18 DIAGNOSIS — I4821 Permanent atrial fibrillation: Secondary | ICD-10-CM | POA: Diagnosis not present

## 2023-01-18 DIAGNOSIS — I429 Cardiomyopathy, unspecified: Secondary | ICD-10-CM | POA: Diagnosis not present

## 2023-01-18 DIAGNOSIS — U071 COVID-19: Secondary | ICD-10-CM | POA: Diagnosis not present

## 2023-01-18 DIAGNOSIS — I498 Other specified cardiac arrhythmias: Secondary | ICD-10-CM | POA: Diagnosis not present

## 2023-01-18 MED ORDER — LOSARTAN POTASSIUM 25 MG PO TABS
25.0000 mg | ORAL_TABLET | Freq: Every day | ORAL | Status: DC
  Administered 2023-01-18 – 2023-01-19 (×2): 25 mg via ORAL
  Filled 2023-01-18 (×2): qty 1

## 2023-01-18 MED ORDER — SPIRONOLACTONE 12.5 MG HALF TABLET
12.5000 mg | ORAL_TABLET | Freq: Every day | ORAL | Status: DC
  Administered 2023-01-18 – 2023-01-19 (×2): 12.5 mg via ORAL
  Filled 2023-01-18 (×3): qty 1

## 2023-01-18 MED ORDER — SODIUM PHOSPHATES 45 MMOLE/15ML IV SOLN
30.0000 mmol | Freq: Once | INTRAVENOUS | Status: AC
  Administered 2023-01-18: 30 mmol via INTRAVENOUS
  Filled 2023-01-18: qty 10

## 2023-01-18 NOTE — Plan of Care (Signed)
  Problem: Health Behavior/Discharge Planning: Goal: Ability to manage health-related needs will improve Outcome: Progressing   Problem: Clinical Measurements: Goal: Respiratory complications will improve Outcome: Progressing Goal: Cardiovascular complication will be avoided Outcome: Progressing   Problem: Activity: Goal: Risk for activity intolerance will decrease Outcome: Progressing   Problem: Nutrition: Goal: Adequate nutrition will be maintained Outcome: Progressing   Problem: Coping: Goal: Level of anxiety will decrease Outcome: Progressing   Problem: Elimination: Goal: Will not experience complications related to urinary retention Outcome: Progressing   Problem: Pain Managment: Goal: General experience of comfort will improve Outcome: Progressing

## 2023-01-18 NOTE — Progress Notes (Signed)
Heart Failure Navigator Progress Note  Assessed for Heart & Vascular TOC clinic readiness.  Patient does not meet criteria due to EF 40-45%, has a scheduled [facility] appointment on 02/08/2023. .   Navigator will sign off at this time.   Rhae Hammock, BSN, Scientist, clinical (histocompatibility and immunogenetics) Only

## 2023-01-18 NOTE — Progress Notes (Signed)
PROGRESS NOTE  Peggy [name]  DOB: [birth year]  PCP: Mast, Man X, NP ZOX:[id number]  DOA: 01/16/2023  LOS: 2 days  Hospital Day: 3  Brief narrative: Peggy [name] is a 89 y.o. female with PMH significant for HTN, CKD, GERD, anemia, chronic venous insufficiency Patient lives at [facility] facility 8/5, patient presented to the ED at [facility] for evaluation after an unwitnessed fall resulting in left lateral rib pain.  Per ED note, patient was noted to be hypoxic at 88% on room air, irregular heart rate at 101, appeared to be in new onset A-fib. Labs with potassium low at 2.2, creatinine 2.2, BNP 265, troponin elevated 178 Respiratory virus panel positive for COVID-19 CT scan of head and cervical spine without any acute intracranial process or acute osseous injury. Chest x-ray, left forearm x-ray, bilateral hip x-ray negative for fracture Admitted to [facility] Cardiology consulted.  Subjective: Patient was seen and examined this afternoon.  Pleasant elderly Caucasian female.  Sitting up in recliner.  Not in distress. Labs from this morning with phosphorus level low  Assessment and plan:   Mild systolic CHF Essential hypertension Echo showed EF of 40 to 45% with global hypokinesis.  Per cardiology note, EF is probably 50 to 55%.  No further intervention required at this time.   PTA meds-Lasix 40 mg daily, metolazone 2.5 mg every other day, amlodipine 5 mg daily, Continue amlodipine. Given severe hypokalemia and the risk of dehydration in an elderly, I would keep Lasix, metolazone on hold.  Not sure why Lasix was initiated in the first place. Cardiology consult appreciated.  Initiated on guideline directed therapy with Coreg, losartan, Aldactone..  Also on amlodipine for blood pressure.  Continue to monitor Net IO Since Admission: 1,052.77 mL [01/18/23 1632] Continue to monitor for daily intake output, weight, blood pressure, BNP, renal function and  electrolytes. Recent Labs  Lab 01/16/23 1125 01/16/23 1833 01/16/23 2032 01/17/23 0403 01/17/23 1645 01/18/23 0419  BNP  --  265.0*  --   --   --   --   BUN 40*  --  36* 34* 35* 33*  CREATININE 2.20*  --  1.95* 1.89* 1.80* 2.00*  NA 132*  --  130* 132* 134* 133*  K 2.2*  --  <2.0* 2.8* 2.9* 4.1  MG  --   --  1.9  --   --  1.7   Acute kidney injury on CKD  Baseline Cr 1.3-1.7 Initially presented with a creatinine of 2.2 which gradually improved, up again in the last 24 hours. Lasix, metolazone on hold monitor creatinine with initiation of losartan, Aldactone.  Severe hypokalemia Hypophosphatemia Potassium level significantly low at less than 2.  Likely due to use of Lasix and metolazone.  Aggressively replaced.  Normal potassium this morning Phosphorus level low at 2.3 today.  Replacement given. Recent Labs  Lab 01/16/23 1125 01/16/23 2032 01/17/23 0403 01/17/23 1645 01/18/23 0419  K 2.2* <2.0* 2.8* 2.9* 4.1  MG  --  1.9  --   --  1.7  PHOS  --   --   --   --  2.3*   Unwitnessed fall Sent to the ED after an unwitnessed fall leading to left lateral rib pain. Patient is unable to recall events.  Wonder if she had orthostatic hypotension due to diuretics. Skeletal survey without fracture. PT eval obtained.  SNF recommended   Elevated trop Likely demand ischemia. No further cardiac work up. Continue aspirin, statin as before  Atrial arrhythmia Initial rhythm tachycardic and irregular raising suspicion of new onset A-fib. Cardiology consult was obtained.  Per cardiology, no evidence of A-fib.  Patient probably has likely wandering atrial pacemaker  COVID infection Acute respiratory failure with hypoxia  COVID pressure positive in the ED Chest x-ray without evidence of pneumonia Currently not on antiviral or steroids. There is a mention of hypoxia and ED note.  Currently on 2 L oxygen manage cannula. Wean down as tolerated. Continue antitussives as needed.    Chronic anemia GERD Iron, B12, folate levels ok. No active bleeding. Stable hemoglobin close to 10. Continue Protonix Recent Labs    05/03/22 0000 08/30/22 0000 12/30/22 0715 12/30/22 0715 01/16/23 1125 01/16/23 1833 01/17/23 0403 01/18/23 0419  HGB 10.0* 10.5* 10.0*  --  10.9*  --  9.8* 9.9*  MCV  --   --  98.4   < > 93.8  --  98.7 98.4  VITAMINB12  --   --   --   --   --  2,430*  --   --   FOLATE  --   --   --   --   --  >40.0  --   --   FERRITIN  --   --   --   --   --  300  --   --   TIBC 275  --   --   --   --   --   --   --   IRON 76  --   --   --   --   --   --   --    < > = values in this interval not displayed.   Impaired mobility Seen by PT.  SNF recommended  Goals of care   Code Status: Full Code     DVT prophylaxis:  SCDs Start: 01/16/23 1656   Antimicrobials: None Fluid: I am not sure why patient was started on fluids last night.  I would stop it at this time. Consultants: Cardiology Family Communication: None at bedside  Status: Inpatient Level of care:  Telemetry   Patient from: Independent living facility Anticipated d/c to: Skilled nursing facility Needs to continue in-hospital care:  Ongoing treatment of CHF, AKI   Diet:  Diet Order             Diet Heart Room service appropriate? Yes; Fluid consistency: Thin  Diet effective now                   Scheduled Meds:  amLODipine  5 mg Oral Daily   vitamin C  250 mg Oral Daily   atorvastatin  80 mg Oral Daily   carvedilol  3.125 mg Oral BID WC   cholecalciferol  4,000 Units Oral Weekly   ferrous sulfate  325 mg Oral Once per day on Monday Wednesday Friday   losartan  25 mg Oral Daily   melatonin  3 mg Oral QHS   mirabegron ER  50 mg Oral Daily   pantoprazole  40 mg Oral Daily   sodium chloride flush  3 mL Intravenous Q12H   spironolactone  12.5 mg Oral Daily    PRN meds: acetaminophen **OR** acetaminophen, albuterol, benzonatate, bisacodyl, hydrALAZINE, ipratropium,  ondansetron **OR** ondansetron (ZOFRAN) IV, mouth rinse, senna-docusate, traMADol   Infusions:   sodium phosphate 30 mmol in dextrose 5 % 250 mL infusion 30 mmol (01/18/23 1206)    Antimicrobials: Anti-infectives (From admission, onward)    None  Nutritional status:  Body mass index is 19.73 kg/m.          Objective: Vitals:   01/18/23 0646 01/18/23 1348  BP: (!) 143/84 106/63  Pulse: 73 79  Resp: 20 (!) 23  Temp: 98.1 F (36.7 C) 98 F (36.7 C)  SpO2: 94% 96%    Intake/Output Summary (Last 24 hours) at 01/18/2023 1632 Last data filed at 01/18/2023 1500 Gross per 24 hour  Intake 1363.64 ml  Output 1000 ml  Net 363.64 ml   Filed Weights   01/16/23 1818  Weight: 54.6 kg   Weight change:  Body mass index is 19.73 kg/m.   Physical Exam: General exam: Pleasant elderly Caucasian female.  Not in physical distress Skin: No rashes, lesions or ulcers. HEENT: Atraumatic, normocephalic, no obvious bleeding Lungs: Clear to auscultate bilaterally CVS: Regular rate and rhythm, no murmur GI/Abd soft, nontender, nondistended, bowel sound present CNS: Alert, awake, oriented x 3 Psychiatry: Mood appropriate Extremities: No edema, no calf tenderness  Data Review: I have personally reviewed the laboratory data and studies available.  F/u labs ordered Unresulted Labs (From admission, onward)    None       Total time spent in review of labs and imaging, patient evaluation, formulation of plan, documentation and communication with family: 55 minutes  Signed, Lorin Glass, MD [facility] 01/18/2023

## 2023-01-18 NOTE — [place] FL2 (Signed)
[place] MEDICAID FL2 LEVEL OF CARE FORM     IDENTIFICATION  Patient Name: Peggy [name] Birthdate: [birth year] Sex: female Admission Date (Current Location): 01/16/2023  [facility] and [place] Number:  Producer, television/film/video and Address:  [facility],  [address] [place], [place] [address]      Provider Number: [id number]  Attending Physician Name and Address:  Lorin Glass, MD  Relative Name and Phone Number:  Adelaine, Ficke (Daughter)  [phone] (Mobile    Current Level of Care: Hospital Recommended Level of Care: Skilled Nursing Facility Prior Approval Number:    Date Approved/Denied:   PASRR Number: [id number]  Discharge Plan: SNF    Current Diagnoses: Patient Active Problem List   Diagnosis Date Noted   A-fib [facility]) 01/16/2023   AKI (acute kidney injury) (HCC) 01/16/2023   Hypokalemia 01/16/2023   Hypoxia 01/16/2023   Bronchitis 09/30/2022   Hematoma of right lower leg 09/08/2022   Protein-calorie malnutrition, severe 12/02/2021   Closed right hip fracture (HCC) 11/30/2021   DNR (do not resuscitate) 11/30/2021   Osteoarthritis, multiple sites 09/24/2021   UTI (urinary tract infection) 07/30/2021   Dry skin 06/18/2021   Boxer's fracture 02/04/2021   Closed right ankle fracture 12/25/2020   Noninfected skin tear of leg, left, subsequent encounter 12/25/2020   Rash 12/23/2020   Pemphigoid 12/16/2020   Osteoporosis 11/28/2020   Slow transit constipation 02/21/2020   Gait abnormality 08/31/2018   Fecal incontinence 08/31/2018   Loose stools 08/31/2018   Right shoulder pain 03/30/2018   Closed nondisp fracture of right medial malleolus with routine healing 11/10/2017   Anemia 11/10/2017   CKD (chronic kidney disease) stage 4, GFR 15-29 ml/min (HCC) 10/11/2017   Venous insufficiency (chronic) (peripheral) 08/10/2017   Incontinent of urine 08/10/2017   Malnutrition of moderate degree 05/19/2016   Hypertension 05/18/2016   Nevus of [place]  06/21/2011   Ptosis of eyebrow 06/21/2011    Orientation RESPIRATION BLADDER Height & Weight     Self  O2 (2L) External catheter, Incontinent Weight: 120 lb 5.9 oz (54.6 kg) Height:  5' 5.5" (166.4 cm)  BEHAVIORAL SYMPTOMS/MOOD NEUROLOGICAL BOWEL NUTRITION STATUS      Continent Diet  AMBULATORY STATUS COMMUNICATION OF NEEDS Skin   Limited Assist Verbally Normal                       Personal Care Assistance Level of Assistance  Bathing, Dressing, Feeding Bathing Assistance: Limited assistance Feeding assistance: Independent Dressing Assistance: Limited assistance     Functional Limitations Info  Sight, Hearing, Speech Sight Info: Impaired (reading glasses) Hearing Info: Adequate Speech Info: Adequate    SPECIAL CARE FACTORS FREQUENCY  PT (By licensed PT), OT (By licensed OT)     PT Frequency: 5 x a week OT Frequency: 5 x a week            Contractures Contractures Info: Not present    Additional Factors Info  Code Status, Allergies Code Status Info: full Allergies Info: Penicillins  Ace Inhibitors  Flagyl (Metronidazole)           Current Medications (01/18/2023):  This is the current hospital active medication list Current Facility-Administered Medications  Medication Dose Route Frequency Provider Last Rate Last Admin   acetaminophen (TYLENOL) tablet 650 mg  650 mg Oral Q6H PRN Hugelmeyer, Alexis, DO       Or   acetaminophen (TYLENOL) suppository 650 mg  650 mg Rectal Q6H PRN Hugelmeyer, Alexis, DO  albuterol (PROVENTIL) (2.5 MG/3ML) 0.083% nebulizer solution 2.5 mg  2.5 mg Nebulization Q6H PRN Hugelmeyer, Alexis, DO       amLODipine (NORVASC) tablet 5 mg  5 mg Oral Daily Hugelmeyer, Alexis, DO   5 mg at 01/18/23 1610   ascorbic acid (VITAMIN C) tablet 250 mg  250 mg Oral Daily Hugelmeyer, Alexis, DO   250 mg at 01/18/23 9604   atorvastatin (LIPITOR) tablet 80 mg  80 mg Oral Daily Hugelmeyer, Alexis, DO   80 mg at 01/17/23 2135   benzonatate  (TESSALON) capsule 100 mg  100 mg Oral TID PRN Hugelmeyer, Alexis, DO       bisacodyl (DULCOLAX) EC tablet 5 mg  5 mg Oral Daily PRN Hugelmeyer, Alexis, DO       carvedilol (COREG) tablet 3.125 mg  3.125 mg Oral BID WC Hugelmeyer, Alexis, DO   3.125 mg at 01/18/23 5409   cholecalciferol (VITAMIN D3) 25 MCG (1000 UNIT) tablet 4,000 Units  4,000 Units Oral Weekly Hugelmeyer, Alexis, DO       dextrose 5 % with KCl 20 mEq / L  infusion  20 mEq Intravenous Continuous Marcelino Duster, MD 75 mL/hr at 01/17/23 1955 20 mEq at 01/17/23 1955   ferrous sulfate tablet 325 mg  325 mg Oral Once per day on Monday Wednesday Friday Hugelmeyer, Holland, DO   325 mg at 01/17/23 0940   hydrALAZINE (APRESOLINE) injection 5 mg  5 mg Intravenous Q6H PRN Hugelmeyer, Alexis, DO       ipratropium (ATROVENT) nebulizer solution 0.5 mg  0.5 mg Nebulization Q6H PRN Hugelmeyer, Alexis, DO       losartan (COZAAR) tablet 25 mg  25 mg Oral Daily Perlie Gold, PA-C       melatonin tablet 3 mg  3 mg Oral QHS Hugelmeyer, Alexis, DO   3 mg at 01/17/23 2135   mirabegron ER (MYRBETRIQ) tablet 50 mg  50 mg Oral Daily Hugelmeyer, Alexis, DO   50 mg at 01/17/23 1309   ondansetron (ZOFRAN) tablet 4 mg  4 mg Oral Q6H PRN Hugelmeyer, Alexis, DO       Or   ondansetron (ZOFRAN) injection 4 mg  4 mg Intravenous Q6H PRN Hugelmeyer, Alexis, DO       Oral care mouth rinse  15 mL Mouth Rinse PRN Hugelmeyer, Alexis, DO       pantoprazole (PROTONIX) EC tablet 40 mg  40 mg Oral Daily Hugelmeyer, Alexis, DO   40 mg at 01/18/23 0829   potassium chloride SA (KLOR-CON M) CR tablet 40 mEq  40 mEq Oral TID Perlie Gold, PA-C   40 mEq at 01/18/23 8119   senna-docusate (Senokot-S) tablet 1 tablet  1 tablet Oral QHS PRN Hugelmeyer, Alexis, DO       sodium chloride flush (NS) 0.9 % injection 3 mL  3 mL Intravenous Q12H Hugelmeyer, Alexis, DO   3 mL at 01/17/23 0940   sodium phosphate 30 mmol in dextrose 5 % 250 mL infusion  30 mmol Intravenous Once  Dahal, Melina Schools, MD       spironolactone (ALDACTONE) tablet 12.5 mg  12.5 mg Oral Daily Perlie Gold, PA-C       traMADol (ULTRAM) tablet 50 mg  50 mg Oral Q12H PRN Hugelmeyer, Alexis, DO         Discharge Medications: Please see discharge summary for a list of discharge medications.  Relevant Imaging Results:  Relevant Lab Results:   Additional Information SSN:[id number]  Larrie Kass, LCSW

## 2023-01-18 NOTE — Evaluation (Signed)
Occupational Therapy Evaluation Patient Details Name: Peggy [name] MRN: [id number] DOB: 03-26-1929 Today's Date: 01/18/2023   History of Present Illness 89 yo female admitted with COVID, AKI, Afib after falling at home. Hx of CKD, R ankle fx 01/2021, THA 2023, anemia   Clinical Impression   Pt presents with decline in function and safety with ADLs and ADL mobility with impaired strength, balance and endurance. PTA pt lived at [facility] (ILF?). Pt reports that she was Ind with ADLs/selfcare and used a rollater for mobility. Pt currently requires mod A for bed mobility to sit EOB, min A with UB ADLs, max  A with LB ADLs, max A with toileting tasks and min A with SPTs. Pt would benefit for acute OT services to address impairments to maximize level of function and safety      Recommendations for follow up therapy are one component of a multi-disciplinary discharge planning process, led by the attending physician.  Recommendations may be updated based on patient status, additional functional criteria and insurance authorization.   Assistance Recommended at Discharge Frequent or constant Supervision/Assistance  Patient can return home with the following A lot of help with bathing/dressing/bathroom;A little help with walking and/or transfers;Direct supervision/assist for medications management    Functional Status Assessment  Patient has had a recent decline in their functional status and demonstrates the ability to make significant improvements in function in a reasonable and predictable amount of time.  Equipment Recommendations  None recommended by OT    Recommendations for Other Services       Precautions / Restrictions Precautions Precautions: Fall Restrictions Weight Bearing Restrictions: No      Mobility Bed Mobility Overal bed mobility: Needs Assistance Bed Mobility: Supine to Sit     Supine to sit: Mod assist     General bed mobility comments: Assist for trunk  and LEs. Pt reports pain L side when attempting to move. Increased time. Cues provided.    Transfers Overall transfer level: Needs assistance Equipment used: Rolling walker (2 wheels) Transfers: Sit to/from Stand, Bed to chair/wheelchair/BSC Sit to Stand: Min assist     Step pivot transfers: Min assist            Balance Overall balance assessment: Needs assistance, History of Falls Sitting-balance support: No upper extremity supported Sitting balance-Leahy Scale: Fair   Postural control: Posterior lean Standing balance support: Bilateral upper extremity supported, Reliant on assistive device for balance, During functional activity Standing balance-Leahy Scale: Poor                             ADL either performed or assessed with clinical judgement   ADL Overall ADL's : Needs assistance/impaired Eating/Feeding: Set up;Sitting   Grooming: Wash/dry hands;Wash/dry face;Min guard;Sitting   Upper Body Bathing: Minimal assistance;Sitting   Lower Body Bathing: Maximal assistance   Upper Body Dressing : Minimal assistance;Sitting   Lower Body Dressing: Maximal assistance   Toilet Transfer: Minimal assistance;Stand-pivot;Cueing for safety   Toileting- Clothing Manipulation and Hygiene: Maximal assistance;Sit to/from stand       Functional mobility during ADLs: Minimal assistance;Cueing for safety       Vision Ability to See in Adequate Light: 1 Impaired Patient Visual Report: No change from baseline       Perception     Praxis      Pertinent Vitals/Pain Pain Assessment Pain Assessment: Faces Pain Score: 0-No pain Faces Pain Scale: Hurts little more Pain Location:  L side Pain Descriptors / Indicators: Guarding, Grimacing Pain Intervention(s): Monitored during session, Repositioned     Hand Dominance Right   Extremity/Trunk Assessment Upper Extremity Assessment Upper Extremity Assessment: Generalized weakness   Lower Extremity  Assessment Lower Extremity Assessment: Defer to PT evaluation   Cervical / Trunk Assessment Cervical / Trunk Assessment: Kyphotic   Communication Communication Communication: No difficulties   Cognition Arousal/Alertness: Awake/alert Behavior During Therapy: WFL for tasks assessed/performed Overall Cognitive Status: No family/caregiver present to determine baseline cognitive functioning                                       General Comments       Exercises     Shoulder Instructions      Home Living Family/patient expects to be discharged to:: Private residence     Type of Home:  ([facility]) Home Access: Level entry     Home Layout: One level     Bathroom Shower/Tub: Walk-in Human resources officer: Handicapped height     Home Equipment: Rollator (4 wheels);Shower seat;Grab bars - toilet;Grab bars - tub/shower          Prior Functioning/Environment Prior Level of Function : Independent/Modified Independent             Mobility Comments: using rollator for ambulation ADLs Comments: pt reports she perfomred ADLs unassisted        OT Problem List: Decreased strength;Impaired balance (sitting and/or standing);Pain;Decreased cognition;Decreased safety awareness;Decreased activity tolerance;Decreased knowledge of use of DME or AE      OT Treatment/Interventions: Self-care/ADL training;DME and/or AE instruction;Therapeutic activities;Balance training;Therapeutic exercise;Patient/family education    OT Goals(Current goals can be found in the care plan section) Acute Rehab OT Goals Patient Stated Goal: get better OT Goal Formulation: With patient Time For Goal Achievement: 02/01/23 Potential to Achieve Goals: Good ADL Goals Pt Will Perform Grooming: with supervision;with set-up;sitting Pt Will Perform Upper Body Bathing: with min guard assist;with supervision;sitting Pt Will Perform Upper Body Dressing: with min guard assist;with  supervision;sitting Pt Will Transfer to Toilet: with min guard assist;stand pivot transfer;ambulating;grab bars Pt Will Perform Toileting - Clothing Manipulation and hygiene: with mod assist;sit to/from stand  OT Frequency: Min 2X/week    Co-evaluation              AM-PAC OT "6 Clicks" Daily Activity     Outcome Measure Help from another person eating meals?: None Help from another person taking care of personal grooming?: A Little Help from another person toileting, which includes using toliet, bedpan, or urinal?: A Lot Help from another person bathing (including washing, rinsing, drying)?: A Lot Help from another person to put on and taking off regular upper body clothing?: A Little Help from another person to put on and taking off regular lower body clothing?: A Lot 6 Click Score: 16   End of Session Equipment Utilized During Treatment: Gait belt Nurse Communication: Mobility status  Activity Tolerance: Patient tolerated treatment well Patient left: in chair;with call bell/phone within reach;with chair alarm set  OT Visit Diagnosis: Unsteadiness on feet (R26.81);Other abnormalities of gait and mobility (R26.89);Muscle weakness (generalized) (M62.81);History of falling (Z91.81);Pain Pain - Right/Left: Left Pain - part of body:  (lateral trunk)                Time: 5784-6962 OT Time Calculation (min): 24 min Charges:  OT General Charges $OT Visit: 1  Visit OT Evaluation $OT Eval Low Complexity: 1 Low OT Treatments $Therapeutic Activity: 8-22 mins    Galen Manila 01/18/2023, 2:10 PM

## 2023-01-18 NOTE — Progress Notes (Signed)
Patient Name: Peggy [name] Date of Encounter: 01/18/2023 [facility] Cardiologist: None   Interval Summary  .    Patient remains confused but is pleasant and in no acute distress. Reports left rib pain with deep breaths. Otherwise without complaint today.   Vital Signs .    Vitals:   01/17/23 0938 01/17/23 1504 01/17/23 2010 01/18/23 0646  BP: (!) 140/89 (!) 140/80 112/81 (!) 143/84  Pulse: 90 77 74 73  Resp:  (!) 22  20  Temp: 98 F (36.7 C) 98.2 F (36.8 C) 98.6 F (37 C) 98.1 F (36.7 C)  TempSrc: Oral Oral Oral Oral  SpO2: 98% 93% 95% 94%  Weight:      Height:        Intake/Output Summary (Last 24 hours) at 01/18/2023 0802 Last data filed at 01/18/2023 8469 Gross per 24 hour  Intake 1947.44 ml  Output 2000 ml  Net -52.56 ml      01/16/2023    6:18 PM 12/29/2022    1:40 PM 12/09/2022    2:44 PM  Last 3 Weights  Weight (lbs) 120 lb 5.9 oz 130 lb 132 lb  Weight (kg) 54.6 kg 58.968 kg 59.875 kg      Telemetry/ECG    Sinus rhythm with wandering atrial pacemaker. Intermittent PACs and PVCs - Personally Reviewed  Physical Exam .   GEN: No acute distress.   Neck: No JVD Cardiac: RRR, no murmurs, rubs, or gallops.  Respiratory: Clear to auscultation bilaterally. Poor inspiratory effort 2/2 rib/flank pain after fall GI: Soft, nontender, non-distended  MS: No edema  Assessment & Plan .     Peggy [name] is a 89 y.o. female with a hx of anemia, chronic kidney disease, gastroesophageal reflux disease, hypertension, chronic venous insufficiency who is being seen for the evaluation of possible new atrial fibrillation at the request of Dr. Emmit Pomfret.   Concern for paroxysmal atrial fibrillation   Per ED notes, patient noted to have atrial fibrillation in triage.  Unable to view tracings or telemetry from this time.  Subsequent ECG upon admission to [facility] as well as telemetry does not show atrial fibrillation but rather WAP/MAT with PACs and  PVCs.   Very likely that WAP triggered by combination of electrolyte derangements and acute illness with COVID-19. Serum potassium now normal. Goal K>4, Mg>2. No indication for oral anticoagulation at this time.   HFmrEF  TTE this admission noted LVEF 40-45% with global hypokinesis although LVEF may actually be higher with interpretation challenging with overall poor acoustic windows. No prior study available for comparison.  Suspect that this abnormal echo may represent a mild cardiomyopathy in the setting of COVID-19 although truly not known if this is new change in LVEF with no prior study. Patient clinically euvolemic. Inability to obtain a history secondary to dementia makes assessment of possible recent CHF symptoms such as exertional tolerance, orthopnea, edema difficult. Will medically manage at this time. Start Losartan 25mg  Start Spironolactone 12.5mg  Continue Coreg 3.125mg  BID Could consider SGLT2   Elevated troponin   Patient admitted s/p fall with COVID-19, hypoxia, and reported new paroxysmal afib found to have troponin 178->178.   Troponin elevation not consistent with ACS but rather type II/demand ischemia event.    Hypoxia   Patient with hypoxia in setting of COVID-19+ status. BNP 265.0 but no obvious volume overload on physical exam. She takes lasix at baseline for chronic venous insufficiency/edema per chart review. Reasonable to continue home Lasix dosing.  For questions or updates, please contact [facility] Please consult [email or web address] for contact info under        Signed, Perlie Gold, PA-C

## 2023-01-19 ENCOUNTER — Non-Acute Institutional Stay (SKILLED_NURSING_FACILITY): Payer: Medicare Other | Admitting: Nurse Practitioner

## 2023-01-19 ENCOUNTER — Encounter: Payer: Self-pay | Admitting: Nurse Practitioner

## 2023-01-19 DIAGNOSIS — I509 Heart failure, unspecified: Secondary | ICD-10-CM | POA: Diagnosis not present

## 2023-01-19 DIAGNOSIS — E876 Hypokalemia: Secondary | ICD-10-CM

## 2023-01-19 DIAGNOSIS — R454 Irritability and anger: Secondary | ICD-10-CM | POA: Diagnosis not present

## 2023-01-19 DIAGNOSIS — R809 Proteinuria, unspecified: Secondary | ICD-10-CM | POA: Diagnosis not present

## 2023-01-19 DIAGNOSIS — E559 Vitamin D deficiency, unspecified: Secondary | ICD-10-CM | POA: Diagnosis not present

## 2023-01-19 DIAGNOSIS — D649 Anemia, unspecified: Secondary | ICD-10-CM

## 2023-01-19 DIAGNOSIS — M810 Age-related osteoporosis without current pathological fracture: Secondary | ICD-10-CM | POA: Diagnosis not present

## 2023-01-19 DIAGNOSIS — N184 Chronic kidney disease, stage 4 (severe): Secondary | ICD-10-CM

## 2023-01-19 DIAGNOSIS — R296 Repeated falls: Secondary | ICD-10-CM | POA: Diagnosis not present

## 2023-01-19 DIAGNOSIS — I10 Essential (primary) hypertension: Secondary | ICD-10-CM

## 2023-01-19 DIAGNOSIS — R0902 Hypoxemia: Secondary | ICD-10-CM | POA: Diagnosis not present

## 2023-01-19 DIAGNOSIS — D631 Anemia in chronic kidney disease: Secondary | ICD-10-CM | POA: Diagnosis not present

## 2023-01-19 DIAGNOSIS — I872 Venous insufficiency (chronic) (peripheral): Secondary | ICD-10-CM | POA: Diagnosis not present

## 2023-01-19 DIAGNOSIS — K5901 Slow transit constipation: Secondary | ICD-10-CM | POA: Diagnosis not present

## 2023-01-19 DIAGNOSIS — D692 Other nonthrombocytopenic purpura: Secondary | ICD-10-CM | POA: Diagnosis not present

## 2023-01-19 DIAGNOSIS — N3942 Incontinence without sensory awareness: Secondary | ICD-10-CM | POA: Diagnosis not present

## 2023-01-19 DIAGNOSIS — I129 Hypertensive chronic kidney disease with stage 1 through stage 4 chronic kidney disease, or unspecified chronic kidney disease: Secondary | ICD-10-CM | POA: Diagnosis not present

## 2023-01-19 DIAGNOSIS — N179 Acute kidney failure, unspecified: Secondary | ICD-10-CM | POA: Diagnosis not present

## 2023-01-19 DIAGNOSIS — I498 Other specified cardiac arrhythmias: Secondary | ICD-10-CM | POA: Diagnosis not present

## 2023-01-19 DIAGNOSIS — U071 COVID-19: Secondary | ICD-10-CM | POA: Diagnosis not present

## 2023-01-19 DIAGNOSIS — L129 Pemphigoid, unspecified: Secondary | ICD-10-CM | POA: Diagnosis not present

## 2023-01-19 DIAGNOSIS — R29898 Other symptoms and signs involving the musculoskeletal system: Secondary | ICD-10-CM | POA: Diagnosis not present

## 2023-01-19 DIAGNOSIS — R531 Weakness: Secondary | ICD-10-CM | POA: Diagnosis not present

## 2023-01-19 DIAGNOSIS — K219 Gastro-esophageal reflux disease without esophagitis: Secondary | ICD-10-CM | POA: Diagnosis not present

## 2023-01-19 DIAGNOSIS — R41841 Cognitive communication deficit: Secondary | ICD-10-CM | POA: Diagnosis not present

## 2023-01-19 DIAGNOSIS — I5023 Acute on chronic systolic (congestive) heart failure: Secondary | ICD-10-CM | POA: Diagnosis not present

## 2023-01-19 DIAGNOSIS — M159 Polyosteoarthritis, unspecified: Secondary | ICD-10-CM | POA: Diagnosis not present

## 2023-01-19 DIAGNOSIS — E782 Mixed hyperlipidemia: Secondary | ICD-10-CM | POA: Diagnosis not present

## 2023-01-19 DIAGNOSIS — I878 Other specified disorders of veins: Secondary | ICD-10-CM | POA: Diagnosis not present

## 2023-01-19 DIAGNOSIS — M8000XD Age-related osteoporosis with current pathological fracture, unspecified site, subsequent encounter for fracture with routine healing: Secondary | ICD-10-CM | POA: Diagnosis not present

## 2023-01-19 DIAGNOSIS — N189 Chronic kidney disease, unspecified: Secondary | ICD-10-CM | POA: Diagnosis not present

## 2023-01-19 DIAGNOSIS — I5022 Chronic systolic (congestive) heart failure: Secondary | ICD-10-CM | POA: Diagnosis not present

## 2023-01-19 DIAGNOSIS — M6281 Muscle weakness (generalized): Secondary | ICD-10-CM | POA: Diagnosis not present

## 2023-01-19 DIAGNOSIS — L120 Bullous pemphigoid: Secondary | ICD-10-CM | POA: Diagnosis not present

## 2023-01-19 DIAGNOSIS — I491 Atrial premature depolarization: Secondary | ICD-10-CM | POA: Diagnosis not present

## 2023-01-19 DIAGNOSIS — Z7401 Bed confinement status: Secondary | ICD-10-CM | POA: Diagnosis not present

## 2023-01-19 DIAGNOSIS — N1832 Chronic kidney disease, stage 3b: Secondary | ICD-10-CM | POA: Diagnosis not present

## 2023-01-19 LAB — BASIC METABOLIC PANEL
Anion gap: 13 (ref 5–15)
BUN: 30 mg/dL — ABNORMAL HIGH (ref 8–23)
CO2: 24 mmol/L (ref 22–32)
Calcium: 8.9 mg/dL (ref 8.9–10.3)
Chloride: 92 mmol/L — ABNORMAL LOW (ref 98–111)
Creatinine, Ser: 1.76 mg/dL — ABNORMAL HIGH (ref 0.44–1.00)
GFR, Estimated: 27 mL/min — ABNORMAL LOW (ref >60)
Glucose, Bld: 125 mg/dL — ABNORMAL HIGH (ref 70–99)
Potassium: 2.9 mmol/L — ABNORMAL LOW (ref 3.5–5.1)
Sodium: 129 mmol/L — ABNORMAL LOW (ref 135–145)

## 2023-01-19 MED ORDER — SPIRONOLACTONE 25 MG PO TABS: 12.5000 mg | ORAL_TABLET | Freq: Every day | ORAL | Status: DC

## 2023-01-19 MED ORDER — MELATONIN 3 MG PO TABS: 3.0000 mg | ORAL_TABLET | Freq: Every day | ORAL | Status: AC

## 2023-01-19 MED ORDER — POTASSIUM CHLORIDE CRYS ER 20 MEQ PO TBCR: 20.0000 meq | EXTENDED_RELEASE_TABLET | Freq: Every day | ORAL | Status: DC

## 2023-01-19 MED ORDER — ATORVASTATIN CALCIUM 80 MG PO TABS: 80.0000 mg | ORAL_TABLET | Freq: Every day | ORAL | Status: DC

## 2023-01-19 MED ORDER — FUROSEMIDE 20 MG PO TABS: 20.0000 mg | ORAL_TABLET | Freq: Every day | ORAL | Status: DC | PRN

## 2023-01-19 MED ORDER — SENNOSIDES-DOCUSATE SODIUM 8.6-50 MG PO TABS: 1.0000 | ORAL_TABLET | Freq: Every evening | ORAL | Status: AC | PRN

## 2023-01-19 MED ORDER — POTASSIUM CHLORIDE CRYS ER 20 MEQ PO TBCR
40.0000 meq | EXTENDED_RELEASE_TABLET | ORAL | Status: AC
  Administered 2023-01-19 (×2): 40 meq via ORAL
  Filled 2023-01-19 (×2): qty 2

## 2023-01-19 MED ORDER — CARVEDILOL 3.125 MG PO TABS: 3.1250 mg | ORAL_TABLET | Freq: Two times a day (BID) | ORAL | Status: DC

## 2023-01-19 MED ORDER — LOSARTAN POTASSIUM 25 MG PO TABS: 25.0000 mg | ORAL_TABLET | Freq: Every day | ORAL | Status: DC

## 2023-01-19 NOTE — Assessment & Plan Note (Signed)
chronic, acute blood loss after surgery, baseline Hgb 9s,  on Pantoprazole for GI protection, FOBT negative, Fe, Vit B12 2430, Folate >40 01/16/23,  EPO 9.9 09/15/21, On Fe, Hgb 9.9 01/18/23

## 2023-01-19 NOTE — Final Progress Note (Signed)
Patient report called to [facility]. All questions answered regarding medication changes and new wounds on patient. Patient dressed and safely transported to ED for discharge via [facility] with staff.

## 2023-01-19 NOTE — Assessment & Plan Note (Signed)
Her urinary frequency/leakage, uses pads, it seems better since taking Myrbetriq in pm per Urology ?

## 2023-01-19 NOTE — Discharge Summary (Signed)
Physician Discharge Summary  Peggy [name] GMW:[id number] DOB: [birth year] DOA: 01/16/2023  PCP: Mast, Man X, NP  Admit date: 01/16/2023 Discharge date: 01/19/2023  Admitted From: [facility] Discharge disposition: [facility]  Recommendations at discharge:  CHF medicines have been updated to carvedilol, losartan, Aldactone with potassium supplement.  Scheduled Lasix and metolazone have been stopped.  If patient starts getting shortness of breath or fluid retention, Lasix as needed can be started.   Brief narrative: Peggy [name] is a 89 y.o. female with PMH significant for HTN, CKD, GERD, anemia, chronic venous insufficiency Patient lives at [facility] 8/5, patient presented to the ED at [facility] for evaluation after an unwitnessed fall resulting in left lateral rib pain.  Per ED note, patient was noted to be hypoxic at 88% on room air, irregular heart rate at 101, appeared to be in new onset A-fib. Labs with potassium low at 2.2, creatinine 2.2, BNP 265, troponin elevated 178 Respiratory virus panel positive for COVID-19 CT scan of head and cervical spine without any acute intracranial process or acute osseous injury. Chest [name]-ray, left forearm [name]-ray, bilateral hip [name]-ray negative for fracture Admitted to [facility] Cardiology consulted.  Subjective: Patient was seen and examined this morning.  Sitting up in recliner.  Not in distress.    Assessment and plan: Mild systolic CHF Essential hypertension Presented with hypoxia  Echo showed EF of 40 to 45% with global hypokinesis.  Per cardiology note, EF is probably 50 to 55%.  No further intervention required at this time.   PTA meds-Lasix 40 mg daily, metolazone 2.5 mg every other day, amlodipine 5 mg daily, Given patient age and comorbidities, a combination of Lasix and metolazone is probably very aggressive for her.   Per cardiology  recommendation, patient has been started on GDMT with Coreg 3.125 mg twice daily, losartan 25 mg daily, Aldactone 12.5 mg daily.  Continue these medicines at discharge.  Blood pressure running in low 100s.  I would stop amlodipine. If patient starts getting shortness of breath or fluid retention, Lasix as needed can be started. Net IO Since Admission: 652.77 mL [01/19/23 1157] Continue to monitor for daily intake output, weight, blood pressure, BNP, renal function and electrolytes. Recent Labs  Lab 01/16/23 1833 01/16/23 2032 01/17/23 0403 01/17/23 1645 01/18/23 0419 01/19/23 0933  BNP 265.0*  --   --   --   --   --   BUN  --  36* 34* 35* 33* 30*  CREATININE  --  1.95* 1.89* 1.80* 2.00* 1.76*  NA  --  130* 132* 134* 133* 129*  K  --  <2.0* 2.8* 2.9* 4.1 2.9*  MG  --  1.9  --   --  1.7  --    Acute kidney injury on CKD  Baseline Cr 1.3-1.7 Initially presented with a creatinine of 2.2 which gradually improved back to baseline.  Severe hypokalemia Hypophosphatemia Potassium level significantly low at less than 2.  Likely due to use of Lasix and metolazone.  Aggressively replaced.  Potassium low again at 2.9 this morning.  2 doses of oral KCl 40 mEq given today.  Will discharge on low-dose potassium supplement at KCl 20 mill equivalent daily. Phosphorus level low at 2.3 yesterday..  Replacement given. Recent Labs  Lab 01/16/23 2032 01/17/23 0403 01/17/23 1645 01/18/23 0419 01/19/23 0933  K <2.0* 2.8* 2.9* 4.1 2.9*  MG 1.9  --   --  1.7  --  PHOS  --   --   --  2.3*  --    Unwitnessed fall Sent to the ED after an unwitnessed fall leading to left lateral rib pain. Patient is unable to recall events.  Wonder if she had orthostatic hypotension due to diuretics. Skeletal survey without fracture. PT eval obtained.  SNF recommended   Elevated trop Likely demand ischemia. No further cardiac work up. Continue aspirin, statin as before   Atrial arrhythmia Initial rhythm  tachycardic and irregular raising suspicion of new onset A-fib. Cardiology consult was obtained.  Per cardiology, no evidence of A-fib.  Patient probably has likely wandering atrial pacemaker  COVID infection Acute respiratory failure with hypoxia  COVID pressure positive in the ED Chest [name]-ray without evidence of pneumonia Currently not on antiviral or steroids. There is a mention of hypoxia and ED note.  Currently on 2 L oxygen via nasal cannula. Wean down as tolerated. Continue antitussives as needed.   Chronic anemia GERD Iron, B12, folate levels ok. No active bleeding. Stable hemoglobin close to 10. Continue Protonix Recent Labs    05/03/22 0000 08/30/22 0000 12/30/22 0715 12/30/22 0715 01/16/23 1125 01/16/23 1833 01/17/23 0403 01/18/23 0419  HGB 10.0* 10.5* 10.0*  --  10.9*  --  9.8* 9.9*  MCV  --   --  98.4   < > 93.8  --  98.7 98.4  VITAMINB12  --   --   --   --   --  2,430*  --   --   FOLATE  --   --   --   --   --  >40.0  --   --   FERRITIN  --   --   --   --   --  300  --   --   TIBC 275  --   --   --   --   --   --   --   IRON 76  --   --   --   --   --   --   --    < > = values in this interval not displayed.   Impaired mobility Seen by PT.  SNF recommended  Goals of care   Code Status: Full Code  Wounds:  -    Discharge Exam:   Vitals:   01/18/23 0646 01/18/23 1348 01/18/23 1951 01/19/23 0454  BP: (!) 143/84 106/63 (!) 90/58 114/65  Pulse: 73 79 78 78  Resp: 20 (!) 23 17 18   Temp: 98.1 F (36.7 C) 98 F (36.7 C) 98.2 F (36.8 C) 98.2 F (36.8 C)  TempSrc: Oral Oral Oral Oral  SpO2: 94% 96% 92% 94%  Weight:      Height:        Body mass index is 19.73 kg/m.  General exam: Pleasant elderly Caucasian female.  Not in physical distress Skin: No rashes, lesions or ulcers. HEENT: Atraumatic, normocephalic, no obvious bleeding Lungs: Clear to auscultate bilaterally CVS: Regular rate and rhythm, no murmur GI/Abd soft, nontender,  nondistended, bowel sound present CNS: Alert, awake, oriented [name] 3 Psychiatry: Mood appropriate Extremities: No edema, no calf tenderness  Follow ups:    Follow-up Information     Peggy Sorrow, NP. Go on 02/08/2023.   Specialty: Cardiology Why: Please follow up with cardiology with Peggy Shields, NP at the [facility] cardiology office (second floot) on 02/08/23 at 8:25 AM Contact information: [address] [place] [address] [phone]  Mast, Man X, NP Follow up.   Specialty: Internal Medicine Contact information: [address] [place] [address] [phone]                 Discharge Instructions:   Discharge Instructions     Call MD for:  difficulty breathing, headache or visual disturbances   Complete by: As directed    Call MD for:  extreme fatigue   Complete by: As directed    Call MD for:  hives   Complete by: As directed    Call MD for:  persistant dizziness or light-headedness   Complete by: As directed    Call MD for:  persistant nausea and vomiting   Complete by: As directed    Call MD for:  severe uncontrolled pain   Complete by: As directed    Call MD for:  temperature >100.4   Complete by: As directed    Diet - low sodium heart healthy   Complete by: As directed    Discharge instructions   Complete by: As directed    Recommendations at discharge:   CHF medicines have been updated to carvedilol, losartan, Aldactone with potassium supplement.  Scheduled Lasix and metolazone have been stopped.  If patient starts getting shortness of breath or fluid retention, Lasix as needed can be started.  Discharge instructions for CHF Check weight daily -preferably same time every day. Restrict fluid intake to 1200 ml daily Restrict salt intake to less than 2 g daily. Call MD if you have one of the following symptoms 1) 3 pound weight gain in 24 hours or 5 pounds in 1 week  2) swelling in the hands, feet or stomach   3) progressive shortness of breath 4) if you have to sleep on extra pillows at night in order to breathe     General discharge instructions: Follow with Primary MD Mast, Man X, NP in 7 days  Please request your PCP  to go over your hospital tests, procedures, radiology results at the follow up. Please get your medicines reviewed and adjusted.  Your PCP may decide to repeat certain labs or tests as needed. Do not drive, operate heavy machinery, perform activities at heights, swimming or participation in water activities or provide baby sitting services if your were admitted for syncope or siezures until you have seen by Primary MD or a Neurologist and advised to do so again. [place] Controlled Substance Reporting System database was reviewed. Do not drive, operate heavy machinery, perform activities at heights, swim, participate in water activities or provide baby-sitting services while on medications for pain, sleep and mood until your outpatient physician has reevaluated you and advised to do so again.  You are strongly recommended to comply with the dose, frequency and duration of prescribed medications. Activity: As tolerated with Full fall precautions use walker/cane & assistance as needed Avoid using any recreational substances like cigarette, tobacco, alcohol, or non-prescribed drug. If you experience worsening of your admission symptoms, develop shortness of breath, life threatening emergency, suicidal or homicidal thoughts you must seek medical attention immediately by calling 911 or calling your MD immediately  if symptoms less severe. You must read complete instructions/literature along with all the possible adverse reactions/side effects for all the medicines you take and that have been prescribed to you. Take any new medicine only after you have completely understood and accepted all the possible adverse reactions/side effects.  Wear Seat belts while driving. You were cared for  by a hospitalist  during your hospital stay. If you have any questions about your discharge medications or the care you received while you were in the hospital after you are discharged, you can call the unit and ask to speak with the hospitalist or the covering physician. Once you are discharged, your primary care physician will handle any further medical issues. Please note that NO REFILLS for any discharge medications will be authorized once you are discharged, as it is imperative that you return to your primary care physician (or establish a relationship with a primary care physician if you do not have one).   Increase activity slowly   Complete by: As directed        Discharge Medications:   Allergies as of 01/19/2023       Reactions   Penicillins Swelling, Other (See Comments)   Facial swelling; can tolerate cephalosporins   Ace Inhibitors Other (See Comments)   Reaction unknown   Flagyl [metronidazole] Other (See Comments)   Reaction unknown        Medication List     STOP taking these medications    amLODipine 5 MG tablet Commonly known as: NORVASC   betamethasone dipropionate 0.05 % cream   doxycycline 50 MG capsule Commonly known as: VIBRAMYCIN   metolazone 2.5 MG tablet Commonly known as: ZAROXOLYN   mupirocin ointment 2 % Commonly known as: BACTROBAN   predniSONE 10 MG tablet Commonly known as: DELTASONE       TAKE these medications    acetaminophen 325 MG tablet Commonly known as: TYLENOL Take 2 tablets (650 mg total) by mouth every 6 (six) hours as needed for mild pain, fever or headache.   aspirin EC 81 MG tablet Take 81 mg by mouth daily. Monday, Wednesday and Friday   atorvastatin 80 MG tablet Commonly known as: LIPITOR Take 1 tablet (80 mg total) by mouth daily.   carvedilol 3.125 MG tablet Commonly known as: COREG Take 1 tablet (3.125 mg total) by mouth 2 (two) times daily with a meal.   cholecalciferol 25 MCG (1000 UNIT) tablet Commonly  known as: VITAMIN D3 Take 1,000 Units by mouth every Monday, Wednesday, and Friday.   FeroSul 325 (65 Fe) MG tablet Generic drug: ferrous sulfate Take 1 tablet (325 mg total) by mouth daily with breakfast. MON, WED, FRI   furosemide 20 MG tablet Commonly known as: LASIX Take 1 tablet (20 mg total) by mouth daily as needed for edema or fluid. What changed:  when to take this reasons to take this Another medication with the same name was removed. Continue taking this medication, and follow the directions you see here.   ICAPS AREDS 2 PO Take 1 capsule by mouth daily with breakfast.   losartan 25 MG tablet Commonly known as: COZAAR Take 1 tablet (25 mg total) by mouth daily. Start taking on: January 20, 2023   melatonin 3 MG Tabs tablet Take 1 tablet (3 mg total) by mouth at bedtime.   Myrbetriq 25 MG Tb24 tablet Generic drug: mirabegron ER TAKE 2 TABLETS BY MOUTH ONCE  DAILY   pantoprazole 40 MG tablet Commonly known as: PROTONIX Take 1 tablet (40 mg total) by mouth daily.   potassium chloride SA 20 MEQ tablet Commonly known as: KLOR-CON M Take 1 tablet (20 mEq total) by mouth daily.   senna-docusate 8.6-50 MG tablet Commonly known as: Senokot-S Take 1 tablet by mouth at bedtime as needed for mild constipation.   spironolactone 25 MG tablet Commonly known as: ALDACTONE Take  0.5 tablets (12.5 mg total) by mouth daily. Start taking on: January 20, 2023   vitamin C 250 MG tablet Commonly known as: ASCORBIC ACID Take 1 tablet (250 mg total) by mouth daily.         The results of significant diagnostics from this hospitalization (including imaging, microbiology, ancillary and laboratory) are listed below for reference.    Procedures and Diagnostic Studies:   ECHOCARDIOGRAM COMPLETE  Result Date: 01/17/2023    ECHOCARDIOGRAM REPORT   Patient Name:   Peggy [name] Date of Exam: 01/17/2023 Medical Rec #:  [id number]          Height:       65.5 in Accession #:     [id number]         Weight:       120.4 lb Date of Birth:  [birth year]         BSA:          1.603 m Patient Age:    89 years           BP:           122/73 mmHg Patient Gender: F                  HR:           78 bpm. Exam Location:  Inpatient Procedure: 2D Echo, Cardiac Doppler and Color Doppler Indications:    I48.92* Unspecified atrial flutter  History:        Patient has no prior history of Echocardiogram examinations.                 Risk Factors:Hypertension.  Sonographer:    Harriette Bouillon RDCS Referring Phys: [id number] ALEXIS HUGELMEYER IMPRESSIONS  1. LVEF challenging with atrial flutter. Left ventricular ejection fraction, by estimation, is 40 to 45%. The left ventricle has mildly decreased function. The left ventricle demonstrates global hypokinesis. Left ventricular diastolic parameters are consistent with Grade I diastolic dysfunction (impaired relaxation).  2. Right ventricular systolic function was not well visualized. The right ventricular size is not well visualized. There is normal pulmonary artery systolic pressure.  3. Left atrial size was moderately dilated.  4. Right atrial size was moderately dilated.  5. The mitral valve is degenerative. Trivial mitral valve regurgitation.  6. The aortic valve was not well visualized. Aortic valve regurgitation is mild.  7. There is mild dilatation of the aortic root, measuring 40 mm. FINDINGS  Left Ventricle: LVEF challenging with atrial flutter. Left ventricular ejection fraction, by estimation, is 40 to 45%. The left ventricle has mildly decreased function. The left ventricle demonstrates global hypokinesis. The left ventricular internal cavity size was normal in size. There is borderline asymmetric left ventricular hypertrophy of the basal-septal segment. Left ventricular diastolic parameters are consistent with Grade I diastolic dysfunction (impaired relaxation). Right Ventricle: The right ventricular size is not well visualized. Right ventricular systolic  function was not well visualized. There is normal pulmonary artery systolic pressure. The tricuspid regurgitant velocity is 2.02 m/s, and with an assumed right  atrial pressure of 3 mmHg, the estimated right ventricular systolic pressure is 19.3 mmHg. Left Atrium: Left atrial size was moderately dilated. Right Atrium: Right atrial size was moderately dilated. Pericardium: There is no evidence of pericardial effusion. Mitral Valve: The mitral valve is degenerative in appearance. Mild to moderate mitral annular calcification. Trivial mitral valve regurgitation. Tricuspid Valve: Tricuspid valve regurgitation is trivial. Aortic Valve: The aortic valve was not well visualized. Aortic  valve regurgitation is mild. Pulmonic Valve: Pulmonic valve regurgitation is not visualized. Aorta: The aortic root and ascending aorta are structurally normal, with no evidence of dilitation. There is mild dilatation of the aortic root, measuring 40 mm. IAS/Shunts: The interatrial septum appears to be lipomatous. The interatrial septum was not well visualized.  LEFT VENTRICLE PLAX 2D LVIDd:         5.10 cm LVIDs:         3.90 cm LV PW:         0.90 cm LV IVS:        0.90 cm LVOT diam:     2.10 cm LV SV:         49 LV SV Index:   30 LVOT Area:     3.46 cm  RIGHT VENTRICLE         IVC TAPSE (M-mode): 1.4 cm  IVC diam: 1.20 cm LEFT ATRIUM             Index LA diam:        3.40 cm 2.12 cm/m LA Vol (A2C):   28.8 ml 17.96 ml/m LA Vol (A4C):   57.6 ml 35.93 ml/m LA Biplane Vol: 42.1 ml 26.26 ml/m  AORTIC VALVE LVOT Vmax:   85.33 cm/s LVOT Vmean:  56.333 cm/s LVOT VTI:    0.141 m  AORTA Ao Root diam: 4.00 cm Ao Asc diam:  3.50 cm MITRAL VALVE                TRICUSPID VALVE MV Area (PHT): 3.99 cm     TR Peak grad:   16.3 mmHg MV Decel Time: 190 msec     TR Vmax:        202.00 cm/s MV E velocity: 49.00 cm/s MV A velocity: 109.00 cm/s  SHUNTS MV E/A ratio:  0.45         Systemic VTI:  0.14 m                             Systemic Diam: 2.10 cm  Carolan Clines Electronically signed by Carolan Clines Signature Date/Time: 01/17/2023/12:52:38 PM    Final    DG Forearm Left  Result Date: 01/16/2023 CLINICAL DATA:  Fall.  Pain. EXAM: LEFT FOREARM - 2 VIEW COMPARISON:  None Available. FINDINGS: There is no evidence of fracture or other focal bone lesions. Soft tissues are unremarkable. IMPRESSION: Negative. Electronically Signed   By: Paulina Fusi M.D.   On: 01/16/2023 12:10   DG Hips Bilat W or Wo Pelvis 3-4 Views  Result Date: 01/16/2023 CLINICAL DATA:  Larey Seat.  Hip and pelvic pain. EXAM: DG HIP (WITH OR WITHOUT PELVIS) 3-4V BILAT COMPARISON:  12/02/2021 FINDINGS: Previous hip replacement on the right. No unexpected finding relative to that. No evidence of pelvic fracture. The left hip shows mild degenerative arthritis but no acute traumatic finding. Calcified leiomyoma incidentally noted in the central pelvis. IMPRESSION: No acute or traumatic finding. Previous right hip replacement. Electronically Signed   By: Paulina Fusi M.D.   On: 01/16/2023 12:09   DG Ribs Unilateral W/Chest Left  Result Date: 01/16/2023 CLINICAL DATA:  Fall with left arm pain and left chest pain EXAM: LEFT RIBS AND CHEST - 3+ VIEW COMPARISON:  11/30/2021 FINDINGS: Hiatal hernia. Heart size is normal. Tortuous aorta. The lungs are clear. No pneumothorax or hemothorax. Left rib films do not show a fracture. Chronic degenerative changes affect the shoulders. Scoliosis of  the spine. IMPRESSION: No acute or traumatic finding. Hiatal hernia. Tortuous aorta. Scoliosis. Chronic degenerative changes of the shoulders. Electronically Signed   By: Paulina Fusi M.D.   On: 01/16/2023 12:08   CT Head Wo Contrast  Result Date: 01/16/2023 CLINICAL DATA:  Fall out of bed with head and neck pain. EXAM: CT HEAD WITHOUT CONTRAST CT CERVICAL SPINE WITHOUT CONTRAST TECHNIQUE: Multidetector CT imaging of the head and cervical spine was performed following the standard protocol without intravenous contrast.  Multiplanar CT image reconstructions of the cervical spine were also generated. RADIATION DOSE REDUCTION: This exam was performed according to the departmental dose-optimization program which includes automated exposure control, adjustment of the mA and/or kV according to patient size and/or use of iterative reconstruction technique. COMPARISON:  None Available. FINDINGS: CT HEAD FINDINGS Brain: No evidence of acute infarction, hemorrhage, hydrocephalus, extra-axial collection or mass lesion/mass effect. There is mild cerebral volume loss with associated ex vacuo dilatation. Periventricular white matter hypoattenuation likely represents chronic small vessel ischemic disease. Vascular: There are vascular calcifications in the carotid siphons. Skull: Normal. Negative for fracture or focal lesion. Sinuses/Orbits: No acute finding. Other: None. CT CERVICAL SPINE FINDINGS Alignment: No traumatic listhesis. Skull base and vertebrae: No acute fracture. No primary bone lesion or focal pathologic process. Soft tissues and spinal canal: No prevertebral fluid or swelling. No visible canal hematoma. Disc levels: Moderate to severe multilevel degenerative disc and joint disease. Upper chest: Negative. Other: None. IMPRESSION: 1. No acute intracranial process. 2. No acute osseous injury in the cervical spine. Electronically Signed   By: Romona Curls M.D.   On: 01/16/2023 11:53   CT Cervical Spine Wo Contrast  Result Date: 01/16/2023 CLINICAL DATA:  Fall out of bed with head and neck pain. EXAM: CT HEAD WITHOUT CONTRAST CT CERVICAL SPINE WITHOUT CONTRAST TECHNIQUE: Multidetector CT imaging of the head and cervical spine was performed following the standard protocol without intravenous contrast. Multiplanar CT image reconstructions of the cervical spine were also generated. RADIATION DOSE REDUCTION: This exam was performed according to the departmental dose-optimization program which includes automated exposure control,  adjustment of the mA and/or kV according to patient size and/or use of iterative reconstruction technique. COMPARISON:  None Available. FINDINGS: CT HEAD FINDINGS Brain: No evidence of acute infarction, hemorrhage, hydrocephalus, extra-axial collection or mass lesion/mass effect. There is mild cerebral volume loss with associated ex vacuo dilatation. Periventricular white matter hypoattenuation likely represents chronic small vessel ischemic disease. Vascular: There are vascular calcifications in the carotid siphons. Skull: Normal. Negative for fracture or focal lesion. Sinuses/Orbits: No acute finding. Other: None. CT CERVICAL SPINE FINDINGS Alignment: No traumatic listhesis. Skull base and vertebrae: No acute fracture. No primary bone lesion or focal pathologic process. Soft tissues and spinal canal: No prevertebral fluid or swelling. No visible canal hematoma. Disc levels: Moderate to severe multilevel degenerative disc and joint disease. Upper chest: Negative. Other: None. IMPRESSION: 1. No acute intracranial process. 2. No acute osseous injury in the cervical spine. Electronically Signed   By: Romona Curls M.D.   On: 01/16/2023 11:53     Labs:   Basic Metabolic Panel: Recent Labs  Lab 01/16/23 2032 01/17/23 0403 01/17/23 1645 01/18/23 0419 01/19/23 0933  NA 130* 132* 134* 133* 129*  K <2.0* 2.8* 2.9* 4.1 2.9*  CL 87* 91* 95* 93* 92*  CO2 30 29 28 29 24   GLUCOSE 106* 93 106* 111* 125*  BUN 36* 34* 35* 33* 30*  CREATININE 1.95* 1.89* 1.80* 2.00* 1.76*  CALCIUM 9.1 9.0 8.5* 9.0 8.9  MG 1.9  --   --  1.7  --   PHOS  --   --   --  2.3*  --    GFR Estimated Creatinine Clearance: 17.9 mL/min (A) (by C-G formula based on SCr of 1.76 mg/dL (H)). Liver Function Tests: Recent Labs  Lab 01/16/23 1125 01/17/23 0403  AST 30 31  ALT 13 17  ALKPHOS 65 54  BILITOT 0.5 0.6  PROT 6.7 5.5*  ALBUMIN 4.0 3.0*   No results for input(s): "LIPASE", "AMYLASE" in the last 168 hours. No results for  input(s): "AMMONIA" in the last 168 hours. Coagulation profile No results for input(s): "INR", "PROTIME" in the last 168 hours.  CBC: Recent Labs  Lab 01/16/23 1125 01/17/23 0403 01/18/23 0419  WBC 8.0 6.5 7.3  NEUTROABS 4.9  --   --   HGB 10.9* 9.8* 9.9*  HCT 31.8* 30.2* 30.4*  MCV 93.8 98.7 98.4  PLT 385 318 318   Cardiac Enzymes: No results for input(s): "CKTOTAL", "CKMB", "CKMBINDEX", "TROPONINI" in the last 168 hours. BNP: Invalid input(s): "POCBNP" CBG: No results for input(s): "GLUCAP" in the last 168 hours. D-Dimer No results for input(s): "DDIMER" in the last 72 hours. Hgb A1c No results for input(s): "HGBA1C" in the last 72 hours. Lipid Profile Recent Labs    01/16/23 1833  CHOL 174  HDL 40*  LDLCALC 120*  TRIG 69  CHOLHDL 4.4   Thyroid function studies Recent Labs    01/16/23 1833  TSH 0.862   Anemia work up Recent Labs    01/16/23 1833  VITAMINB12 2,430*  FOLATE >40.0  FERRITIN 300   Microbiology Recent Results (from the past 240 hour(s))  SARS Coronavirus 2 by RT PCR (hospital order, performed in [facility] hospital lab) *cepheid single result test* Anterior Nasal Swab     Status: Abnormal   Collection Time: 01/16/23  1:08 PM   Specimen: Anterior Nasal Swab  Result Value Ref Range Status   SARS Coronavirus 2 by RT PCR POSITIVE (A) NEGATIVE Final    Comment: (NOTE) SARS-CoV-2 target nucleic acids are DETECTED  SARS-CoV-2 RNA is generally detectable in upper respiratory specimens  during the acute phase of infection.  Positive results are indicative  of the presence of the identified virus, but do not rule out bacterial infection or co-infection with other pathogens not detected by the test.  Clinical correlation with patient history and  other diagnostic information is necessary to determine patient infection status.  The expected result is negative.  Fact Sheet for Patients:   RoadLapTop.co.za   Fact Sheet  for Healthcare Providers:   http://kim-miller.com/    This test is not yet approved or cleared by the [place] FDA and  has been authorized for detection and/or diagnosis of SARS-CoV-2 by FDA under an Emergency Use Authorization (EUA).  This EUA will remain in effect (meaning this test can be used) for the duration of  the COVID-19 declaration under Section 564(b)(1)  of the Act, 21 U.S.C. section 360-bbb-3(b)(1), unless the authorization is terminated or revoked sooner.   Performed at [facility], [address], [place], [place] [address]   MRSA Next Gen by PCR, Nasal     Status: None   Collection Time: 01/16/23  8:39 PM   Specimen: Urine, Clean Catch; Nasal Swab  Result Value Ref Range Status   MRSA by PCR Next Gen NOT DETECTED NOT DETECTED Final    Comment: (NOTE) The  GeneXpert MRSA Assay (FDA approved for NASAL specimens only), is one component of a comprehensive MRSA colonization surveillance program. It is not intended to diagnose MRSA infection nor to guide or monitor treatment for MRSA infections. Test performance is not FDA approved in patients less than 36 years old. Performed at [facility], [address], [place], [place] [address]     Time coordinating discharge: 45 minutes  Signed: Melina Schools   [facility] 01/19/2023, 11:57 AM

## 2023-01-19 NOTE — Assessment & Plan Note (Signed)
Bun/creat 30/1.76 01/19/23 at baseline,   saw nephrology

## 2023-01-19 NOTE — Assessment & Plan Note (Signed)
K 2.2<<2.9 01/19/23-supplemented, repeat CMP/eGFR

## 2023-01-19 NOTE — Assessment & Plan Note (Signed)
chronic, on Aldactone now, 07/2021 venous US LLE negative DVT

## 2023-01-19 NOTE — Assessment & Plan Note (Signed)
She was found to be in new onset Afib, cardiology: no evidence of Afib, probably wandering atrial pacer.

## 2023-01-19 NOTE — Assessment & Plan Note (Signed)
blood pressure is controlled, on Amlodipine started 07/07/21 by Nephrology, now taking Losartan, Carvedilol, may dc Amlodipine if Bp<100 per cardiology.

## 2023-01-19 NOTE — Progress Notes (Unsigned)
Location:   SNF [facility] Room Number: 2B Place of Service:  SNF (31) Provider: Arna Snipe  NP  ,  X, NP  Patient Care Team: ,  X, NP as PCP - General (Internal Medicine) Peggy Red, MD as PCP - Cardiology (Cardiology)  Extended Emergency Contact Information Primary Emergency Contact: Peggy [name] Address: [address]          [place], [place] [address] [name] of [place] Home Phone: [phone] Work Phone: [phone] Mobile Phone: [phone] Relation: Daughter Secondary Emergency Contact: Peggy [name] Home Phone: [phone] Relation: Daughter  Code Status: DNR Goals of care: Advanced Directive information    01/16/2023    3:00 PM  Advanced Directives  Does Patient Have a Medical Advance Directive? Yes  Type of Estate agent of [place];Living will  Does patient want to make changes to medical advance directive? No - Patient declined     Chief Complaint  Patient presents with   Acute Visit    F/u hospital stay    HPI:  Pt is a 89 y.o. female seen today for an acute visit for f/u hospital stay  Hospitalized 01/16/23-01/19/23 s/p fall, left sided rib pain, CT head/cervical spine showed no acute intracranial process, negative Xray hips. She was found to be in new onset Afib, cardiology: no evidence of Afib, probably wandering atrial pacer.  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              Her urinary frequency/leakage, uses pads, it seems better since taking Myrbetriq in pm per Urology             HTN, blood pressure is controlled, on Amlodipine started 07/07/21 by Nephrology, now taking Losartan, Carvedilol, may dc Amlodipine if Bp<100 per cardiology.              No constipation, taking Colace bid, prn MiraLax              Osteoporosis, off Alendronate due to CKD, 11/05/20 DEXA t score -2.8, desires delaying DEXA             Fecal incontinence prn Imodium    Past Medical History:  Diagnosis Date   Anemia    Chronic kidney disease    stage 3   GERD (gastroesophageal reflux disease)    Hypertension    Past Surgical History:  Procedure Laterality Date   ORIF ANKLE FRACTURE Right 01/13/2021   Procedure: OPEN TREATMENT OF RIGHT TRIMALLEOLAR ANKLE FRACTURE WITHOUT POSTERIOR FIXATION, POSSIBLE SYNDESMOSIS;  Surgeon: Peggy Hart, MD;  Location: [facility];  Service: Orthopedics;  Laterality: Right;   TOTAL HIP ARTHROPLASTY Right 12/02/2021   Procedure: TOTAL HIP ARTHROPLASTY;  Surgeon: Peggy Laura, MD;  Location: [facility];  Service: Orthopedics;  Laterality: Right;    Allergies  Allergen Reactions  Penicillins Swelling and Other (See Comments)    Facial swelling; can tolerate cephalosporins    Ace Inhibitors Other (See Comments)    Reaction unknown   Flagyl [Metronidazole] Other (See Comments)    Reaction unknown    Allergies as of 01/19/2023       Reactions   Penicillins Swelling, Other (See Comments)   Facial swelling; can tolerate cephalosporins   Ace Inhibitors Other (See Comments)   Reaction unknown   Flagyl [metronidazole] Other (See Comments)   Reaction unknown        Medication List        Accurate as of January 19, 2023 11:59 PM. If you have any  questions, ask your nurse or doctor.          acetaminophen 325 MG tablet Commonly known as: TYLENOL Take 2 tablets (650 mg total) by mouth every 6 (six) hours as needed for mild pain, fever or headache.   aspirin EC 81 MG tablet Take 81 mg by mouth daily. Monday, Wednesday and Friday   atorvastatin 80 MG tablet Commonly known as: LIPITOR Take 1 tablet (80 mg total) by mouth daily.   carvedilol 3.125 MG tablet Commonly known as: COREG Take 1 tablet (3.125 mg total) by mouth 2 (two) times daily with a meal.   cholecalciferol 25 MCG (1000 UNIT) tablet Commonly known as: VITAMIN D3 Take 1,000 Units by mouth every Monday, Wednesday, and Friday.   FeroSul 325 (65 Fe) MG tablet Generic drug: ferrous sulfate Take 1 tablet (325 mg total) by mouth daily with breakfast. MON, WED, FRI   furosemide 20 MG tablet Commonly known as: LASIX Take 1 tablet (20 mg total) by mouth daily as needed for edema or fluid.   ICAPS AREDS 2 PO Take 1 capsule by mouth daily with breakfast.   losartan 25 MG tablet Commonly known as: COZAAR Take 1 tablet (25 mg total) by mouth daily.   melatonin 3 MG Tabs tablet Take 1 tablet (3 mg total) by mouth at bedtime.   Myrbetriq 25 MG Tb24 tablet Generic drug: mirabegron ER TAKE 2 TABLETS BY MOUTH ONCE  DAILY   pantoprazole 40 MG tablet Commonly known as: PROTONIX Take 1 tablet (40 mg total) by mouth daily.   potassium chloride SA 20 MEQ tablet Commonly known as: KLOR-CON M Take 1 tablet (20 mEq total) by mouth daily.   senna-docusate 8.6-50 MG tablet Commonly known as: Senokot-S Take 1 tablet by mouth at bedtime as needed for mild constipation.   spironolactone 25 MG tablet Commonly known as: ALDACTONE Take 0.5 tablets (12.5 mg total) by mouth daily.   vitamin C 250 MG tablet Commonly known as: ASCORBIC ACID Take 1 tablet (250 mg total) by mouth daily.        Review of Systems  Constitutional:  Positive for fatigue. Negative for  appetite change and fever.  HENT:  Positive for hearing loss. Negative for congestion and sore throat.   Eyes:  Negative for visual disturbance.  Respiratory:  Positive for cough. Negative for shortness of breath and wheezing.        Occasional cough, scant greenish phlegm production.   Cardiovascular:  Negative for chest pain and leg swelling.  Gastrointestinal:  Negative for abdominal pain and constipation.       Fecal incontinent.   Genitourinary:  Positive for frequency. Negative for decreased urine volume, dysuria and urgency.       Occasionally incontinent of urine. Urination average every 4 hrs during day 1x/night.  Musculoskeletal:  Positive for arthralgias and gait problem.       Right lower leg/ankle brace  Skin:  Positive for wound. Negative for color change.  Neurological:  Negative for speech difficulty, weakness and light-headedness.       Memory lapses.   Psychiatric/Behavioral:  Negative for behavioral problems and sleep disturbance. The patient is not nervous/anxious.     Immunization History  Administered Date(s) Administered   Fluad Quad(high Dose 65+) 03/28/2019   H1N1 03/18/2009   Influenza, High Dose Seasonal PF 03/25/2017, 03/16/2018, 03/14/2021, 03/14/2022   Influenza,inj,quad, With Preservative 03/26/2020, 03/30/2021   Influenza-Unspecified 02/25/2011, 03/31/2012, 03/27/2013, 03/14/2014, 03/25/2016, 06/14/2017, 03/28/2019   Moderna SARS-COV2 Booster Vaccination 04/22/2020, 10/28/2020   Moderna Sars-Covid-2 Vaccination 06/18/2019, 07/16/2019   PFIZER(Purple Top)SARS-COV-2 Vaccination 03/03/2021   PPD Test 12/25/2020, 01/05/2021, 12/17/2021   Pfizer Covid-19 Vaccine Bivalent Booster 17yrs & up 03/03/2021   Pneumococcal Conjugate-13 09/10/2014   Pneumococcal Polysaccharide-23 11/03/2014   Pneumococcal-Unspecified 06/14/2017   Tdap 11/27/2019   Unspecified SARS-COV-2 Vaccination 04/22/2020, 10/28/2020   Zoster Recombinant(Shingrix) 08/27/2022, 08/27/2022    Pertinent  Health Maintenance Due  Topic Date Due   INFLUENZA VACCINE  01/13/2023   DEXA SCAN  Completed      06/17/2022    1:45 PM 07/01/2022   11:07 AM 09/08/2022    2:58 PM 09/23/2022    3:00 PM 11/05/2022    1:51 PM  Fall Risk  Falls in the past year? 0 0 0 0 0  Was there an injury with Fall? 0 0 0 0 0  Fall Risk Category Calculator 0 0 0 0 0  Fall Risk Category (Retired) Low      (RETIRED) Patient Fall Risk Level Low fall risk      Patient at Risk for Falls Due to No Fall Risks No Fall Risks No Fall Risks No Fall Risks No Fall Risks  Fall risk Follow up Falls evaluation completed Falls evaluation completed Falls evaluation completed Falls evaluation completed Falls evaluation completed   Functional Status Survey:    Vitals:   01/19/23 1634  BP: (!) 143/88  Pulse: 92  Resp: 16  Temp: 98 F (36.7 C)  SpO2: 94%  Weight: 121 lb 4.8 oz (55 kg)   Body mass index is 19.88 kg/m. Physical Exam Vitals reviewed.  Constitutional:      Comments: fatigue  HENT:     Head: Normocephalic and atraumatic.     Nose: Nose normal.     Mouth/Throat:     Mouth: Mucous membranes are moist.  Eyes:     Extraocular Movements: Extraocular movements intact.     Conjunctiva/sclera: Conjunctivae normal.     Pupils: Pupils are equal, round, and reactive to light.  Cardiovascular:     Rate and Rhythm: Normal rate and regular rhythm.     Heart sounds: No murmur heard. Pulmonary:     Effort: Pulmonary effort is normal.     Breath sounds: Rales present. No wheezing or rhonchi.     Comments: Bibasilar rales.  Abdominal:     General: Bowel sounds are normal.     Palpations: Abdomen is soft.     Tenderness: There is no abdominal tenderness.  Musculoskeletal:        General: No tenderness.     Cervical back: Normal range of motion and neck supple.     Right lower leg: No edema.     Left lower leg: No edema.     Comments: R ankle s/p ORIF 01/13/21. Edema BLE  trace. THR 11/2021  Skin:     General: Skin is warm and dry.     Comments: Chronic pigmented venous insufficiency skin changes BLE. Skin tear left forearm, multiple scabbed over areas BLE.   Neurological:     Mental Status: She is alert. Mental status is at baseline.     Gait: Gait abnormal.     Comments: Oriented to person, place.   Psychiatric:        Mood and Affect: Mood normal.        Behavior: Behavior normal.        Thought Content: Thought content normal.     Labs reviewed: Recent Labs    01/16/23 2032 01/17/23 0403 01/17/23 1645 01/18/23 0419 01/19/23 0933  NA 130*   < > 134* 133* 129*  K <2.0*   < > 2.9* 4.1 2.9*  CL 87*   < > 95* 93* 92*  CO2 30   < > 28 29 24   GLUCOSE 106*   < > 106* 111* 125*  BUN 36*   < > 35* 33* 30*  CREATININE 1.95*   < > 1.80* 2.00* 1.76*  CALCIUM 9.1   < > 8.5* 9.0 8.9  MG 1.9  --   --  1.7  --   PHOS  --   --   --  2.3*  --    < > = values in this interval not displayed.   Recent Labs    08/30/22 0000 01/16/23 1125 01/17/23 0403  AST  --  30 31  ALT  --  13 17  ALKPHOS  --  65 54  BILITOT  --  0.5 0.6  PROT  --  6.7 5.5*  ALBUMIN 4.2 4.0 3.0*   Recent Labs    01/16/23 1125 01/17/23 0403 01/18/23 0419  WBC 8.0 6.5 7.3  NEUTROABS 4.9  --   --   HGB 10.9* 9.8* 9.9*  HCT 31.8* 30.2* 30.4*  MCV 93.8 98.7 98.4  PLT 385 318 318   Lab Results  Component Value Date   TSH 0.862 01/16/2023   No results found for: "HGBA1C" Lab Results  Component Value Date   CHOL 174 01/16/2023   HDL 40 (L) 01/16/2023   LDLCALC 120 (H) 01/16/2023   TRIG 69 01/16/2023   CHOLHDL 4.4 01/16/2023    Significant Diagnostic Results in last 30 days:  ECHOCARDIOGRAM COMPLETE  Result Date: 01/17/2023    ECHOCARDIOGRAM REPORT   Patient Name:   Peggy [name] Date of Exam: 01/17/2023 Medical Rec #:  [id number]          Height:       65.5 in Accession #:    [id number]         Weight:       120.4 lb Date of Birth:  04/19/1929         BSA:          1.603 m Patient Age:    89  years           BP:           122/73 mmHg Patient Gender: F                  HR:           78 bpm. Exam Location:  Inpatient Procedure: 2D Echo, Cardiac Doppler and Color Doppler Indications:    I48.92* Unspecified atrial flutter  History:        Patient has  no prior history of Echocardiogram examinations.                 Risk Factors:Hypertension.  Sonographer:    Harriette Bouillon RDCS Referring Phys: [id number] ALEXIS HUGELMEYER IMPRESSIONS  1. LVEF challenging with atrial flutter. Left ventricular ejection fraction, by estimation, is 40 to 45%. The left ventricle has mildly decreased function. The left ventricle demonstrates global hypokinesis. Left ventricular diastolic parameters are consistent with Grade I diastolic dysfunction (impaired relaxation).  2. Right ventricular systolic function was not well visualized. The right ventricular size is not well visualized. There is normal pulmonary artery systolic pressure.  3. Left atrial size was moderately dilated.  4. Right atrial size was moderately dilated.  5. The mitral valve is degenerative. Trivial mitral valve regurgitation.  6. The aortic valve was not well visualized. Aortic valve regurgitation is mild.  7. There is mild dilatation of the aortic root, measuring 40 mm. FINDINGS  Left Ventricle: LVEF challenging with atrial flutter. Left ventricular ejection fraction, by estimation, is 40 to 45%. The left ventricle has mildly decreased function. The left ventricle demonstrates global hypokinesis. The left ventricular internal cavity size was normal in size. There is borderline asymmetric left ventricular hypertrophy of the basal-septal segment. Left ventricular diastolic parameters are consistent with Grade I diastolic dysfunction (impaired relaxation). Right Ventricle: The right ventricular size is not well visualized. Right ventricular systolic function was not well visualized. There is normal pulmonary artery systolic pressure. The tricuspid regurgitant  velocity is 2.02 m/s, and with an assumed right  atrial pressure of 3 mmHg, the estimated right ventricular systolic pressure is 19.3 mmHg. Left Atrium: Left atrial size was moderately dilated. Right Atrium: Right atrial size was moderately dilated. Pericardium: There is no evidence of pericardial effusion. Mitral Valve: The mitral valve is degenerative in appearance. Mild to moderate mitral annular calcification. Trivial mitral valve regurgitation. Tricuspid Valve: Tricuspid valve regurgitation is trivial. Aortic Valve: The aortic valve was not well visualized. Aortic valve regurgitation is mild. Pulmonic Valve: Pulmonic valve regurgitation is not visualized. Aorta: The aortic root and ascending aorta are structurally normal, with no evidence of dilitation. There is mild dilatation of the aortic root, measuring 40 mm. IAS/Shunts: The interatrial septum appears to be lipomatous. The interatrial septum was not well visualized.  LEFT VENTRICLE PLAX 2D LVIDd:         5.10 cm LVIDs:         3.90 cm LV PW:         0.90 cm LV IVS:        0.90 cm LVOT diam:     2.10 cm LV SV:         49 LV SV Index:   30 LVOT Area:     3.46 cm  RIGHT VENTRICLE         IVC TAPSE (M-mode): 1.4 cm  IVC diam: 1.20 cm LEFT ATRIUM             Index LA diam:        3.40 cm 2.12 cm/m LA Vol (A2C):   28.8 ml 17.96 ml/m LA Vol (A4C):   57.6 ml 35.93 ml/m LA Biplane Vol: 42.1 ml 26.26 ml/m  AORTIC VALVE LVOT Vmax:   85.33 cm/s LVOT Vmean:  56.333 cm/s LVOT VTI:    0.141 m  AORTA Ao Root diam: 4.00 cm Ao Asc diam:  3.50 cm MITRAL VALVE  TRICUSPID VALVE MV Area (PHT): 3.99 cm     TR Peak grad:   16.3 mmHg MV Decel Time: 190 msec     TR Vmax:        202.00 cm/s MV E velocity: 49.00 cm/s MV A velocity: 109.00 cm/s  SHUNTS MV E/A ratio:  0.45         Systemic VTI:  0.14 m                             Systemic Diam: 2.10 cm Carolan Clines Electronically signed by Carolan Clines Signature Date/Time: 01/17/2023/12:52:38 PM    Final    DG Forearm  Left  Result Date: 01/16/2023 CLINICAL DATA:  Fall.  Pain. EXAM: LEFT FOREARM - 2 VIEW COMPARISON:  None Available. FINDINGS: There is no evidence of fracture or other focal bone lesions. Soft tissues are unremarkable. IMPRESSION: Negative. Electronically Signed   By: Paulina Fusi M.D.   On: 01/16/2023 12:10   DG Hips Bilat W or Wo Pelvis 3-4 Views  Result Date: 01/16/2023 CLINICAL DATA:  Larey Seat.  Hip and pelvic pain. EXAM: DG HIP (WITH OR WITHOUT PELVIS) 3-4V BILAT COMPARISON:  12/02/2021 FINDINGS: Previous hip replacement on the right. No unexpected finding relative to that. No evidence of pelvic fracture. The left hip shows mild degenerative arthritis but no acute traumatic finding. Calcified leiomyoma incidentally noted in the central pelvis. IMPRESSION: No acute or traumatic finding. Previous right hip replacement. Electronically Signed   By: Paulina Fusi M.D.   On: 01/16/2023 12:09   DG Ribs Unilateral W/Chest Left  Result Date: 01/16/2023 CLINICAL DATA:  Fall with left arm pain and left chest pain EXAM: LEFT RIBS AND CHEST - 3+ VIEW COMPARISON:  11/30/2021 FINDINGS: Hiatal hernia. Heart size is normal. Tortuous aorta. The lungs are clear. No pneumothorax or hemothorax. Left rib films do not show a fracture. Chronic degenerative changes affect the shoulders. Scoliosis of the spine. IMPRESSION: No acute or traumatic finding. Hiatal hernia. Tortuous aorta. Scoliosis. Chronic degenerative changes of the shoulders. Electronically Signed   By: Paulina Fusi M.D.   On: 01/16/2023 12:08   CT Head Wo Contrast  Result Date: 01/16/2023 CLINICAL DATA:  Fall out of bed with head and neck pain. EXAM: CT HEAD WITHOUT CONTRAST CT CERVICAL SPINE WITHOUT CONTRAST TECHNIQUE: Multidetector CT imaging of the head and cervical spine was performed following the standard protocol without intravenous contrast. Multiplanar CT image reconstructions of the cervical spine were also generated. RADIATION DOSE REDUCTION: This exam  was performed according to the departmental dose-optimization program which includes automated exposure control, adjustment of the mA and/or kV according to patient size and/or use of iterative reconstruction technique. COMPARISON:  None Available. FINDINGS: CT HEAD FINDINGS Brain: No evidence of acute infarction, hemorrhage, hydrocephalus, extra-axial collection or mass lesion/mass effect. There is mild cerebral volume loss with associated ex vacuo dilatation. Periventricular white matter hypoattenuation likely represents chronic small vessel ischemic disease. Vascular: There are vascular calcifications in the carotid siphons. Skull: Normal. Negative for fracture or focal lesion. Sinuses/Orbits: No acute finding. Other: None. CT CERVICAL SPINE FINDINGS Alignment: No traumatic listhesis. Skull base and vertebrae: No acute fracture. No primary bone lesion or focal pathologic process. Soft tissues and spinal canal: No prevertebral fluid or swelling. No visible canal hematoma. Disc levels: Moderate to severe multilevel degenerative disc and joint disease. Upper chest: Negative. Other: None. IMPRESSION: 1. No acute intracranial process. 2. No acute osseous injury  in the cervical spine. Electronically Signed   By: Romona Curls M.D.   On: 01/16/2023 11:53   CT Cervical Spine Wo Contrast  Result Date: 01/16/2023 CLINICAL DATA:  Fall out of bed with head and neck pain. EXAM: CT HEAD WITHOUT CONTRAST CT CERVICAL SPINE WITHOUT CONTRAST TECHNIQUE: Multidetector CT imaging of the head and cervical spine was performed following the standard protocol without intravenous contrast. Multiplanar CT image reconstructions of the cervical spine were also generated. RADIATION DOSE REDUCTION: This exam was performed according to the departmental dose-optimization program which includes automated exposure control, adjustment of the mA and/or kV according to patient size and/or use of iterative reconstruction technique. COMPARISON:   None Available. FINDINGS: CT HEAD FINDINGS Brain: No evidence of acute infarction, hemorrhage, hydrocephalus, extra-axial collection or mass lesion/mass effect. There is mild cerebral volume loss with associated ex vacuo dilatation. Periventricular white matter hypoattenuation likely represents chronic small vessel ischemic disease. Vascular: There are vascular calcifications in the carotid siphons. Skull: Normal. Negative for fracture or focal lesion. Sinuses/Orbits: No acute finding. Other: None. CT CERVICAL SPINE FINDINGS Alignment: No traumatic listhesis. Skull base and vertebrae: No acute fracture. No primary bone lesion or focal pathologic process. Soft tissues and spinal canal: No prevertebral fluid or swelling. No visible canal hematoma. Disc levels: Moderate to severe multilevel degenerative disc and joint disease. Upper chest: Negative. Other: None. IMPRESSION: 1. No acute intracranial process. 2. No acute osseous injury in the cervical spine. Electronically Signed   By: Romona Curls M.D.   On: 01/16/2023 11:53    Assessment/Plan: Atrial arrhythmia She was found to be in new onset Afib, cardiology: no evidence of Afib, probably wandering atrial pacer.  Congestive heart failure (CHF) (HCC) BNP 265, EF 40-45%, started on Carvedilol, Losartan, Aldactone, off Furosemide and Metolazone, may resume Furosemide if fluid retention develops, may stop Amlodipine if Bp<100, f/u cardiology 02/08/23. Monitor weight weekly  COVID-19 virus infection COVID tested positive, asymptomatic, unremarkable CXR, baseline cough, resolved hypoxia  Hypokalemia  K 2.2<<2.9 01/19/23-supplemented, repeat CMP/eGFR  Anemia  chronic, acute blood loss after surgery, baseline Hgb 9s,  on Pantoprazole for GI protection, FOBT negative, Fe, Vit B12 2430, Folate >40 01/16/23,  EPO 9.9 09/15/21, On Fe, Hgb 9.9 01/18/23   Venous insufficiency (chronic) (peripheral) chronic, on Aldactone now, 07/2021 venous US LLE negative DVT  CKD  (chronic kidney disease) stage 4, GFR 15-29 ml/min (HCC) Bun/creat 30/1.76 01/19/23 at baseline,   saw nephrology  Osteoarthritis, multiple sites Hx of R ankle fx, R hip total hip arthroplasty 11/30/21, ambulates with walker.   Incontinent of urine Her urinary frequency/leakage, uses pads, it seems better since taking Myrbetriq in pm per Urology  Hypertension  blood pressure is controlled, on Amlodipine started 07/07/21 by Nephrology, now taking Losartan, Carvedilol, may dc Amlodipine if Bp<100 per cardiology.     Family/ staff Communication: plan of care reviewed with the patient and charge nurse.   Labs/tests ordered: CBC/diff, CMP/eGFR  Time spend 35 minutes.

## 2023-01-19 NOTE — TOC Transition Note (Signed)
Transition of Care [facility]) - CM/SW Discharge Note   Patient Details  Name: Peggy [name] MRN: [id number] Date of Birth: [birth year]  [facility] CM/SW Contact:  Larrie Kass, LCSW Phone Number: 01/19/2023, 12:11 PM   Clinical Narrative:    Pt to d/c to [facility] , Rm assignment [place] [facility], call report [phone] ex [phone]. CSW spoke with [name] to inform her of transfer.  PTAR called no further TOC needs, TOC sign off.    Final next level of care: Skilled Nursing Facility Barriers to Discharge: No Barriers Identified   Patient Goals and CMS Choice      Discharge Placement                Patient chooses bed at: [facility] [place]   Name of family member notified: [name],[name] Patient and family notified of of transfer: 01/19/23  Discharge Plan and Services Additional resources added to the After Visit Summary for                                       Social Determinants of Health (SDOH) Interventions SDOH Screenings   Food Insecurity: No Food Insecurity (01/16/2023)  Housing: Low Risk  (01/16/2023)  Transportation Needs: No Transportation Needs (01/16/2023)  Utilities: Not At Risk (01/16/2023)  Depression (PHQ2-9): Low Risk  (09/23/2022)  Financial Resource Strain: Low Risk  (10/20/2017)  Physical Activity: Inactive (10/20/2017)  Social Connections: Somewhat Isolated (10/20/2017)  Stress: No Stress Concern Present (10/20/2017)  Tobacco Use: Medium Risk (01/16/2023)     Readmission Risk Interventions     No data to display

## 2023-01-19 NOTE — Plan of Care (Signed)
  Problem: Education: Goal: Knowledge of General Education information will improve Description: Including pain rating scale, medication(s)/side effects and non-pharmacologic comfort measures Outcome: Adequate for Discharge   Problem: Health Behavior/Discharge Planning: Goal: Ability to manage health-related needs will improve Outcome: Adequate for Discharge   Problem: Clinical Measurements: Goal: Ability to maintain clinical measurements within normal limits will improve Outcome: Adequate for Discharge Goal: Will remain free from infection Outcome: Adequate for Discharge Goal: Diagnostic test results will improve Outcome: Adequate for Discharge Goal: Respiratory complications will improve Outcome: Adequate for Discharge Goal: Cardiovascular complication will be avoided Outcome: Adequate for Discharge   Problem: Activity: Goal: Risk for activity intolerance will decrease Outcome: Adequate for Discharge   Problem: Nutrition: Goal: Adequate nutrition will be maintained Outcome: Adequate for Discharge   Problem: Coping: Goal: Level of anxiety will decrease Outcome: Adequate for Discharge   Problem: Elimination: Goal: Will not experience complications related to bowel motility Outcome: Adequate for Discharge Goal: Will not experience complications related to urinary retention Outcome: Adequate for Discharge   Problem: Pain Managment: Goal: General experience of comfort will improve Outcome: Adequate for Discharge   Problem: Safety: Goal: Ability to remain free from injury will improve Outcome: Adequate for Discharge   Problem: Skin Integrity: Goal: Risk for impaired skin integrity will decrease Outcome: Adequate for Discharge   Problem: Education: Goal: Knowledge of disease or condition will improve Outcome: Adequate for Discharge Goal: Understanding of medication regimen will improve Outcome: Adequate for Discharge Goal: Individualized Educational  Video(s) Outcome: Adequate for Discharge   Problem: Activity: Goal: Ability to tolerate increased activity will improve Outcome: Adequate for Discharge   Problem: Cardiac: Goal: Ability to achieve and maintain adequate cardiopulmonary perfusion will improve Outcome: Adequate for Discharge   Problem: Health Behavior/Discharge Planning: Goal: Ability to safely manage health-related needs after discharge will improve Outcome: Adequate for Discharge   Problem: Education: Goal: Knowledge of disease or condition will improve Outcome: Adequate for Discharge Goal: Understanding of medication regimen will improve Outcome: Adequate for Discharge Goal: Individualized Educational Video(s) Outcome: Adequate for Discharge   Problem: Activity: Goal: Ability to tolerate increased activity will improve Outcome: Adequate for Discharge   Problem: Cardiac: Goal: Ability to achieve and maintain adequate cardiopulmonary perfusion will improve Outcome: Adequate for Discharge   Problem: Health Behavior/Discharge Planning: Goal: Ability to safely manage health-related needs after discharge will improve Outcome: Adequate for Discharge   Problem: Education: Goal: Knowledge of risk factors and measures for prevention of condition will improve Outcome: Adequate for Discharge   Problem: Coping: Goal: Psychosocial and spiritual needs will be supported Outcome: Adequate for Discharge   Problem: Respiratory: Goal: Will maintain a patent airway Outcome: Adequate for Discharge Goal: Complications related to the disease process, condition or treatment will be avoided or minimized Outcome: Adequate for Discharge

## 2023-01-19 NOTE — Assessment & Plan Note (Signed)
BNP 265, EF 40-45%, started on Carvedilol, Losartan, Aldactone, off Furosemide and Metolazone, may resume Furosemide if fluid retention develops, may stop Amlodipine if Bp<100, f/u cardiology 02/08/23. Monitor weight weekly

## 2023-01-19 NOTE — Progress Notes (Signed)
Patient had skin tears present on admission. Left AC IV infiltrate; edemas below the IV site, worsen the skin tear. Dressing changed after assess the site.

## 2023-01-19 NOTE — Assessment & Plan Note (Signed)
COVID tested positive, asymptomatic, unremarkable CXR, baseline cough, resolved hypoxia

## 2023-01-19 NOTE — Assessment & Plan Note (Signed)
Hx of R ankle fx, R hip total hip arthroplasty 11/30/21, ambulates with walker.

## 2023-01-21 ENCOUNTER — Non-Acute Institutional Stay (SKILLED_NURSING_FACILITY): Payer: Medicare Other | Admitting: Sports Medicine

## 2023-01-21 ENCOUNTER — Encounter: Payer: Self-pay | Admitting: Family Medicine

## 2023-01-21 DIAGNOSIS — D649 Anemia, unspecified: Secondary | ICD-10-CM | POA: Diagnosis not present

## 2023-01-21 DIAGNOSIS — U071 COVID-19: Secondary | ICD-10-CM

## 2023-01-21 DIAGNOSIS — M810 Age-related osteoporosis without current pathological fracture: Secondary | ICD-10-CM | POA: Diagnosis not present

## 2023-01-21 DIAGNOSIS — I509 Heart failure, unspecified: Secondary | ICD-10-CM | POA: Diagnosis not present

## 2023-01-21 DIAGNOSIS — I10 Essential (primary) hypertension: Secondary | ICD-10-CM | POA: Diagnosis not present

## 2023-01-21 DIAGNOSIS — N179 Acute kidney failure, unspecified: Secondary | ICD-10-CM

## 2023-01-21 DIAGNOSIS — E876 Hypokalemia: Secondary | ICD-10-CM | POA: Diagnosis not present

## 2023-01-21 DIAGNOSIS — E559 Vitamin D deficiency, unspecified: Secondary | ICD-10-CM | POA: Diagnosis not present

## 2023-01-21 NOTE — Progress Notes (Unsigned)
Nursing Home Location:    [facility]  Date  of Service:  01/21/2023  PCP: Mast, Man X, NP  Allergies  Allergen Reactions   Penicillins Swelling and Other (See Comments)    Facial swelling; can tolerate cephalosporins    Ace Inhibitors Other (See Comments)    Reaction unknown   Flagyl [Metronidazole] Other (See Comments)    Reaction unknown    No chief complaint on file.   HPI:  Patient is a 89 y.o. female seen today for admission to SNF  Pt was hospitalized from 8/4 to 8/7.   Pt was seen and examined in her room, she seems to be pleasant and comfortable seated in her wheel chair. She knows her name, not oriented to time or place. She can tell me what she ate for her breakfast . Pt noted with intermittent cough , does not appear to be in distress. She is on 2 lit [place].  Denies chest pain, SOB, abdominal pain, nausea, vomiting , dysuria, hematuria, pain. As per RN, She is having intermittent confusion , unknown baseline mentation. She is eating ok, requiring 1 person assistance with transferring. Sleeping fine.   Hospital Course  Pt was admitted s/p fall found to have new onset Afib.  As per d/c summary  '' Brief narrative: Peggy [name] is a 89 y.o. female with PMH significant for HTN, CKD, GERD, anemia, chronic venous insufficiency Patient lives at [facility] facility 8/5, patient presented to the ED at [facility] for evaluation after an unwitnessed fall resulting in left lateral rib pain.   Per ED note, patient was noted to be hypoxic at 88% on room air, irregular heart rate at 101, appeared to be in new onset A-fib. Labs with potassium low at 2.2, creatinine 2.2, BNP 265, troponin elevated 178 Respiratory virus panel positive for COVID-19 CT scan of head and cervical spine without any acute intracranial process or acute osseous injury. Chest x-ray, left forearm x-ray, bilateral hip x-ray negative for fracture Admitted to [facility] Cardiology  consulted.   Subjective: Patient was seen and examined this morning.  Sitting up in recliner.  Not in distress.     Assessment and plan: Mild systolic CHF Essential hypertension Presented with hypoxia  Echo showed EF of 40 to 45% with global hypokinesis.  Per cardiology note, EF is probably 50 to 55%.  No further intervention required at this time.   PTA meds-Lasix 40 mg daily, metolazone 2.5 mg every other day, amlodipine 5 mg daily, Given patient age and comorbidities, a combination of Lasix and metolazone is probably very aggressive for her.   Per cardiology recommendation, patient has been started on GDMT with Coreg 3.125 mg twice daily, losartan 25 mg daily, Aldactone 12.5 mg daily.  Continue these medicines at discharge.  Blood pressure running in low 100s.  I would stop amlodipine. If patient starts getting shortness of breath or fluid retention, Lasix as needed can be started. Net IO Since Admission: 652.77 mL [01/19/23 1157] Continue to monitor for daily intake output, weight, blood pressure, BNP, renal function and electrolytes. Last Labs          Recent Labs  Lab 01/16/23 1833 01/16/23 2032 01/17/23 0403 01/17/23 1645 01/18/23 0419 01/19/23 0933  BNP 265.0*  --   --   --   --   --   BUN  --  36* 34* 35* 33* 30*  CREATININE  --  1.95* 1.89* 1.80* 2.00* 1.76*  NA  --  130* 132* 134* 133* 129*  K  --  <2.0* 2.8* 2.9* 4.1 2.9*  MG  --  1.9  --   --  1.7  --       Acute kidney injury on CKD  Baseline Cr 1.3-1.7 Initially presented with a creatinine of 2.2 which gradually improved back to baseline.   Severe hypokalemia Hypophosphatemia Potassium level significantly low at less than 2.  Likely due to use of Lasix and metolazone.  Aggressively replaced.  Potassium low again at 2.9 this morning.  2 doses of oral KCl 40 mEq given today.  Will discharge on low-dose potassium supplement at KCl 20 mill equivalent daily. Phosphorus level low at 2.3 yesterday..  Replacement  given. Last Labs         Recent Labs  Lab 01/16/23 2032 01/17/23 0403 01/17/23 1645 01/18/23 0419 01/19/23 0933  K <2.0* 2.8* 2.9* 4.1 2.9*  MG 1.9  --   --  1.7  --   PHOS  --   --   --  2.3*  --       Unwitnessed fall Sent to the ED after an unwitnessed fall leading to left lateral rib pain. Patient is unable to recall events.  Wonder if she had orthostatic hypotension due to diuretics. Skeletal survey without fracture. PT eval obtained.  SNF recommended   Elevated trop Likely demand ischemia. No further cardiac work up. Continue aspirin, statin as before   Atrial arrhythmia Initial rhythm tachycardic and irregular raising suspicion of new onset A-fib. Cardiology consult was obtained.  Per cardiology, no evidence of A-fib.  Patient probably has likely wandering atrial pacemaker   COVID infection Acute respiratory failure with hypoxia  COVID pressure positive in the ED Chest x-ray without evidence of pneumonia Currently not on antiviral or steroids. There is a mention of hypoxia and ED note.  Currently on 2 L oxygen via nasal cannula. Wean down as tolerated. Continue antitussives as needed.   Chronic anemia GERD Iron, B12, folate levels ok. No active bleeding. Stable hemoglobin close to 10. Continue Protonix Recent Labs (within last 365 days)            Recent Labs    05/03/22 0000 08/30/22 0000 12/30/22 0715 12/30/22 0715 01/16/23 1125 01/16/23 1833 01/17/23 0403 01/18/23 0419  HGB 10.0* 10.5* 10.0*  --  10.9*  --  9.8* 9.9*  MCV  --   --  98.4   < > 93.8  --  98.7 98.4  VITAMINB12  --   --   --   --   --  2,430*  --   --   FOLATE  --   --   --   --   --  >40.0  --   --   FERRITIN  --   --   --   --   --  300  --   --   TIBC 275  --   --   --   --   --   --   --   IRON 76  --   --   --   --   --   --   --    < > = values in this interval not displayed.      Impaired mobility Seen by PT.  SNF recommended''    Recommendations at discharge:  CHF  medicines have been updated to carvedilol, losartan, Aldactone with potassium supplement.  Scheduled Lasix and metolazone have been stopped.  If  patient starts getting shortness of breath or fluid retention, Lasix as needed can be started.  Medication list   Medication List       STOP taking these medications     amLODipine 5 MG tablet Commonly known as: NORVASC    betamethasone dipropionate 0.05 % cream    doxycycline 50 MG capsule Commonly known as: VIBRAMYCIN    metolazone 2.5 MG tablet Commonly known as: ZAROXOLYN    mupirocin ointment 2 % Commonly known as: BACTROBAN    predniSONE 10 MG tablet Commonly known as: DELTASONE           TAKE these medications     acetaminophen 325 MG tablet Commonly known as: TYLENOL Take 2 tablets (650 mg total) by mouth every 6 (six) hours as needed for mild pain, fever or headache.    aspirin EC 81 MG tablet Take 81 mg by mouth daily. Monday, Wednesday and Friday    atorvastatin 80 MG tablet Commonly known as: LIPITOR Take 1 tablet (80 mg total) by mouth daily.    carvedilol 3.125 MG tablet Commonly known as: COREG Take 1 tablet (3.125 mg total) by mouth 2 (two) times daily with a meal.    cholecalciferol 25 MCG (1000 UNIT) tablet Commonly known as: VITAMIN D3 Take 1,000 Units by mouth every Monday, Wednesday, and Friday.    FeroSul 325 (65 Fe) MG tablet Generic drug: ferrous sulfate Take 1 tablet (325 mg total) by mouth daily with breakfast. MON, WED, FRI    furosemide 20 MG tablet Commonly known as: LASIX Take 1 tablet (20 mg total) by mouth daily as needed for edema or fluid. What changed:  when to take this reasons to take this Another medication with the same name was removed. Continue taking this medication, and follow the directions you see here.    ICAPS AREDS 2 PO Take 1 capsule by mouth daily with breakfast.    losartan 25 MG tablet Commonly known as: COZAAR Take 1 tablet (25 mg total) by mouth  daily. Start taking on: January 20, 2023    melatonin 3 MG Tabs tablet Take 1 tablet (3 mg total) by mouth at bedtime.    Myrbetriq 25 MG Tb24 tablet Generic drug: mirabegron ER TAKE 2 TABLETS BY MOUTH ONCE  DAILY    pantoprazole 40 MG tablet Commonly known as: PROTONIX Take 1 tablet (40 mg total) by mouth daily.    potassium chloride SA 20 MEQ tablet Commonly known as: KLOR-CON M Take 1 tablet (20 mEq total) by mouth daily.    senna-docusate 8.6-50 MG tablet Commonly known as: Senokot-S Take 1 tablet by mouth at bedtime as needed for mild constipation.    spironolactone 25 MG tablet Commonly known as: ALDACTONE Take 0.5 tablets (12.5 mg total) by mouth daily. Start taking on: January 20, 2023    vitamin C 250 MG tablet Commonly known as: ASCORBIC ACID Take 1 tablet (250 mg total) by mouth daily.       Imaging   EXAM: CT HEAD WITHOUT CONTRAST   CT CERVICAL SPINE WITHOUT CONTRAST   TECHNIQUE: Multidetector CT imaging of the head and cervical spine was performed following the standard protocol without intravenous contrast. Multiplanar CT image reconstructions of the cervical spine were also generated.   RADIATION DOSE REDUCTION: This exam was performed according to the departmental dose-optimization program which includes automated exposure control, adjustment of the mA and/or kV according to patient size and/or use of iterative reconstruction technique.   COMPARISON:  None  Available.   FINDINGS: CT HEAD FINDINGS   Brain: No evidence of acute infarction, hemorrhage, hydrocephalus, extra-axial collection or mass lesion/mass effect. There is mild cerebral volume loss with associated ex vacuo dilatation. Periventricular white matter hypoattenuation likely represents chronic small vessel ischemic disease.   Vascular: There are vascular calcifications in the carotid siphons.   Skull: Normal. Negative for fracture or focal lesion.   Sinuses/Orbits: No acute  finding.   Other: None.   CT CERVICAL SPINE FINDINGS   Alignment: No traumatic listhesis.   Skull base and vertebrae: No acute fracture. No primary bone lesion or focal pathologic process.   Soft tissues and spinal canal: No prevertebral fluid or swelling. No visible canal hematoma.   Disc levels: Moderate to severe multilevel degenerative disc and joint disease.   Upper chest: Negative.   Other: None.   IMPRESSION: 1. No acute intracranial process. 2. No acute osseous injury in the cervical spine.  ECHO showed EF - 40- 45 % with global hypokinesis.  Review of Systems:  Review of Systems  Unable to perform ROS: Age  Constitutional:  Negative for appetite change and fever.  HENT:  Negative for congestion and drooling.   Eyes:  Negative for discharge.  Respiratory:  Positive for cough. Negative for shortness of breath and wheezing.   Cardiovascular:  Negative for chest pain and leg swelling.  Gastrointestinal:  Negative for abdominal distention, abdominal pain, blood in stool, constipation, diarrhea and nausea.  Genitourinary:  Negative for difficulty urinating, dysuria and hematuria.  Musculoskeletal:  Negative for arthralgias.  Skin:            Neurological:  Negative for dizziness and headaches.  Psychiatric/Behavioral:  Negative for behavioral problems.     Past Medical History:  Diagnosis Date   Anemia    Chronic kidney disease    stage 3   GERD (gastroesophageal reflux disease)    Hypertension    Past Surgical History:  Procedure Laterality Date   ORIF ANKLE FRACTURE Right 01/13/2021   Procedure: OPEN TREATMENT OF RIGHT TRIMALLEOLAR ANKLE FRACTURE WITHOUT POSTERIOR FIXATION, POSSIBLE SYNDESMOSIS;  Surgeon: Terance Hart, MD;  Location: [facility] OR;  Service: Orthopedics;  Laterality: Right;   TOTAL HIP ARTHROPLASTY Right 12/02/2021   Procedure: TOTAL HIP ARTHROPLASTY;  Surgeon: Joen Laura, MD;  Location: [facility] ORS;  Service: Orthopedics;  Laterality:  Right;   Social History:   reports that she quit smoking about 65 years ago. Her smoking use included cigarettes. She started smoking about 77 years ago. She has never used smokeless tobacco. She reports that she does not currently use alcohol. She reports that she does not use drugs.  Family History  Problem Relation Age of Onset   Heart failure Mother    Heart disease Father    Arthritis Sister     Medications: Patient's Medications  New Prescriptions   No medications on file  Previous Medications   ACETAMINOPHEN (TYLENOL) 325 MG TABLET    Take 2 tablets (650 mg total) by mouth every 6 (six) hours as needed for mild pain, fever or headache.   ASPIRIN EC 81 MG TABLET    Take 81 mg by mouth daily. Monday, Wednesday and Friday   ATORVASTATIN (LIPITOR) 80 MG TABLET    Take 1 tablet (80 mg total) by mouth daily.   CARVEDILOL (COREG) 3.125 MG TABLET    Take 1 tablet (3.125 mg total) by mouth 2 (two) times daily with a meal.   CHOLECALCIFEROL (VITAMIN D3) 25 MCG (  1000 UNIT) TABLET    Take 1,000 Units by mouth every Monday, Wednesday, and Friday.   FERROUS SULFATE (FEROSUL) 325 (65 FE) MG TABLET    Take 1 tablet (325 mg total) by mouth daily with breakfast. MON, WED, FRI   FUROSEMIDE (LASIX) 20 MG TABLET    Take 1 tablet (20 mg total) by mouth daily as needed for edema or fluid.   LOSARTAN (COZAAR) 25 MG TABLET    Take 1 tablet (25 mg total) by mouth daily.   MELATONIN 3 MG TABS TABLET    Take 1 tablet (3 mg total) by mouth at bedtime.   MULTIPLE VITAMINS-MINERALS (ICAPS AREDS 2 PO)    Take 1 capsule by mouth daily with breakfast.   MYRBETRIQ 25 MG TB24 TABLET    TAKE 2 TABLETS BY MOUTH ONCE  DAILY   PANTOPRAZOLE (PROTONIX) 40 MG TABLET    Take 1 tablet (40 mg total) by mouth daily.   POTASSIUM CHLORIDE SA (KLOR-CON M) 20 MEQ TABLET    Take 1 tablet (20 mEq total) by mouth daily.   SENNA-DOCUSATE (SENOKOT-S) 8.6-50 MG TABLET    Take 1 tablet by mouth at bedtime as needed for mild  constipation.   SPIRONOLACTONE (ALDACTONE) 25 MG TABLET    Take 0.5 tablets (12.5 mg total) by mouth daily.   VITAMIN C (ASCORBIC ACID) 250 MG TABLET    Take 1 tablet (250 mg total) by mouth daily.  Modified Medications   No medications on file  Discontinued Medications   No medications on file     Physical Exam: There were no vitals filed for this visit.  Physical Exam Constitutional:      General: She is not in acute distress.    Appearance: She is not toxic-appearing.  HENT:     Head: Normocephalic.  Cardiovascular:     Rate and Rhythm: Normal rate and regular rhythm.     Pulses: Normal pulses.     Heart sounds: Normal heart sounds.  Pulmonary:     Breath sounds: Rales (lower 1/3 crackles +) present. No wheezing.  Musculoskeletal:        General: No swelling.  Skin:    General: Skin is warm.     Comments: Superficial shallow ulcers one on each lower extremity ,  Rt - 2cm superficial shallow ulcer with yellowish slough, no surrounding erythema. Left - 1.5cm, superficial ulcer with scab , no surrounding erythema.  Neurological:     Mental Status: She is alert.     Labs reviewed: Basic Metabolic Panel: Recent Labs    01/16/23 2032 01/17/23 0403 01/17/23 1645 01/18/23 0419 01/19/23 0933  NA 130*   < > 134* 133* 129*  K <2.0*   < > 2.9* 4.1 2.9*  CL 87*   < > 95* 93* 92*  CO2 30   < > 28 29 24   GLUCOSE 106*   < > 106* 111* 125*  BUN 36*   < > 35* 33* 30*  CREATININE 1.95*   < > 1.80* 2.00* 1.76*  CALCIUM 9.1   < > 8.5* 9.0 8.9  MG 1.9  --   --  1.7  --   PHOS  --   --   --  2.3*  --    < > = values in this interval not displayed.   Liver Function Tests: Recent Labs    08/30/22 0000 01/16/23 1125 01/17/23 0403  AST  --  30 31  ALT  --  13  17  ALKPHOS  --  65 54  BILITOT  --  0.5 0.6  PROT  --  6.7 5.5*  ALBUMIN 4.2 4.0 3.0*   No results for input(s): "LIPASE", "AMYLASE" in the last 8760 hours. No results for input(s): "AMMONIA" in the last 8760  hours. CBC: Recent Labs    01/16/23 1125 01/17/23 0403 01/18/23 0419  WBC 8.0 6.5 7.3  NEUTROABS 4.9  --   --   HGB 10.9* 9.8* 9.9*  HCT 31.8* 30.2* 30.4*  MCV 93.8 98.7 98.4  PLT 385 318 318   TSH: Recent Labs    01/16/23 1833  TSH 0.862   A1C: No results found for: "HGBA1C" Lipid Panel: Recent Labs    01/16/23 1833  CHOL 174  HDL 40*  LDLCALC 120*  TRIG 69  CHOLHDL 4.4      Assessment/Plan   HFpEF  EF  40- 45 %  Lungs crackles + No lower extremity oedema BNP (last 3 results) Recent Labs    01/16/23 1833  BNP 265.0*   Lab Results  Component Value Date   CREATININE 1.76 (H) 01/19/2023   CREATININE 2.00 (H) 01/18/2023   CREATININE 1.80 (H) 01/17/2023  Low salt diet  Daily weight check Cont with 2 lit [place]  Cont with aspirin, losartan, Lipitor, coreg, spironolactone Cont with lasix prn,  Cont with potassium Will check labs Will monitor for any worsening oedema.  Covid Pt noted with intermittent cough  On 2 lit [place] Will cont with robitussin q6 prn  Will monitor for any desaturation   Unwitnessed fall Pt denies pain  Requiring 1 person assistance for transferring  Will cont with PT/ OT   Hypokalemia    Latest Ref Rng & Units 01/19/2023    9:33 AM 01/18/2023    4:19 AM 01/17/2023    4:45 PM  BMP  Glucose 70 - 99 mg/dL 401  027  253   BUN 8 - 23 mg/dL 30  33  35   Creatinine 0.44 - 1.00 mg/dL 6.64  4.03  4.74   Sodium 135 - 145 mmol/L 129  133  134   Potassium 3.5 - 5.1 mmol/L 2.9  4.1  2.9   Chloride 98 - 111 mmol/L 92  93  95   CO2 22 - 32 mmol/L 24  29  28    Calcium 8.9 - 10.3 mg/dL 8.9  9.0  8.5    Will check K levels  AKI on CKD  Will check bmp levels  Avid nephrotoxic meds  AOCD Cont with iron, vit c.  Osteoporosis Vit d    Dr Venita Sheffield Medical Director [facility] [phone] 8 am - 5 pm) [phone] (after hours)

## 2023-01-22 LAB — COMPREHENSIVE METABOLIC PANEL
Calcium: 9.1 (ref 8.7–10.7)
eGFR: 32

## 2023-01-22 LAB — BASIC METABOLIC PANEL
BUN: 29 — AB (ref 4–21)
CO2: 20 (ref 13–22)
Chloride: 97 — AB (ref 99–108)
Creatinine: 1.5 — AB (ref 0.5–1.1)
Glucose: 93
Potassium: 4.2 mEq/L (ref 3.5–5.1)
Sodium: 129 — AB (ref 137–147)

## 2023-01-22 LAB — VITAMIN D 25 HYDROXY (VIT D DEFICIENCY, FRACTURES): Vit D, 25-Hydroxy: 108

## 2023-01-23 ENCOUNTER — Telehealth: Payer: Self-pay | Admitting: Family

## 2023-01-23 ENCOUNTER — Encounter: Payer: Self-pay | Admitting: Sports Medicine

## 2023-01-23 NOTE — Telephone Encounter (Signed)
[facility] facility nurse called to report patient's lab work results creatinine 1.53 previous 1.76, sodium 129 stable from previous and vitamin D 108 currently on vitamin D supplements though patient currently being treated for COVID 19 infection.Please follow up.

## 2023-01-24 ENCOUNTER — Encounter: Payer: Self-pay | Admitting: Family Medicine

## 2023-01-25 LAB — CBC: RBC: 2.98 — AB (ref 3.87–5.11)

## 2023-01-25 LAB — HEPATIC FUNCTION PANEL
ALT: 13 U/L (ref 7–35)
AST: 16 (ref 13–35)
Alkaline Phosphatase: 73 (ref 25–125)
Bilirubin, Total: 0.7

## 2023-01-25 LAB — COMPREHENSIVE METABOLIC PANEL
Albumin: 3.4 — AB (ref 3.5–5.0)
Calcium: 9.2 (ref 8.7–10.7)
Globulin: 2.3
eGFR: 31

## 2023-01-25 LAB — BASIC METABOLIC PANEL
CO2: 26 — AB (ref 13–22)
Chloride: 99 (ref 99–108)
Creatinine: 1.6 — AB (ref 0.5–1.1)
Glucose: 114
Potassium: 4.9 mEq/L (ref 3.5–5.1)
Sodium: 132 — AB (ref 137–147)

## 2023-01-25 LAB — CBC AND DIFFERENTIAL
HCT: 28 — AB (ref 36–46)
Hemoglobin: 9.5 — AB (ref 12.0–16.0)
Neutrophils Absolute: 6270
Platelets: 268 10*3/uL (ref 150–400)
WBC: 9.1

## 2023-02-01 ENCOUNTER — Encounter: Payer: Self-pay | Admitting: Nurse Practitioner

## 2023-02-01 ENCOUNTER — Non-Acute Institutional Stay (SKILLED_NURSING_FACILITY): Payer: Medicare Other | Admitting: Nurse Practitioner

## 2023-02-01 DIAGNOSIS — D649 Anemia, unspecified: Secondary | ICD-10-CM

## 2023-02-01 DIAGNOSIS — N3942 Incontinence without sensory awareness: Secondary | ICD-10-CM

## 2023-02-01 DIAGNOSIS — L129 Pemphigoid, unspecified: Secondary | ICD-10-CM

## 2023-02-01 DIAGNOSIS — N184 Chronic kidney disease, stage 4 (severe): Secondary | ICD-10-CM | POA: Diagnosis not present

## 2023-02-01 DIAGNOSIS — I509 Heart failure, unspecified: Secondary | ICD-10-CM

## 2023-02-01 DIAGNOSIS — M159 Polyosteoarthritis, unspecified: Secondary | ICD-10-CM | POA: Diagnosis not present

## 2023-02-01 DIAGNOSIS — I10 Essential (primary) hypertension: Secondary | ICD-10-CM | POA: Diagnosis not present

## 2023-02-01 DIAGNOSIS — R454 Irritability and anger: Secondary | ICD-10-CM | POA: Diagnosis not present

## 2023-02-01 DIAGNOSIS — I872 Venous insufficiency (chronic) (peripheral): Secondary | ICD-10-CM

## 2023-02-01 NOTE — Assessment & Plan Note (Signed)
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

## 2023-02-01 NOTE — Assessment & Plan Note (Signed)
chronic, minimal on Aldactone now, 07/2021 venous US LLE negative DVT, prn Furosemide available.

## 2023-02-01 NOTE — Progress Notes (Unsigned)
Location:   [facility] Room Number: 002B Place of Service:  SNF (31) Provider:  Enis Riecke Link Snuffer, NP  Decarlos Empey X, NP  Patient Care Team: Kerilyn Cortner X, NP as PCP - General (Internal Medicine) [name], MD as PCP - Cardiology (Cardiology)  Extended Emergency Contact Information Primary Emergency Contact: Shen,[name] Address: [address]          [place], [place] [address] [name] of [place] Home Phone: [phone] Work Phone: [phone] Mobile Phone: [phone] Relation: Daughter Secondary Emergency Contact: [name],[name] Home Phone: [phone] Relation: Daughter  Code Status:  FULL CODE Goals of care: Advanced Directive information    02/01/2023    2:48 PM  Advanced Directives  Does Patient Have a Medical Advance Directive? Yes  Type of Estate agent of [place];Out of facility DNR (pink MOST or yellow form)  Does patient want to make changes to medical advance directive? No - Patient declined  Copy of Healthcare Power of Attorney in Chart? Yes - validated most recent copy scanned in chart (See row information)  Pre-existing out of facility DNR order (yellow form or pink MOST form) Pink MOST form placed in chart (order not valid for inpatient use);Yellow form placed in chart (order not valid for inpatient use)     Chief Complaint  Patient presents with   Acute Visit    Blisters on legs    HPI:  Pt is a 89 y.o. female seen today for an acute visit for flare up bullous pemphigoid BLE    Hospitalized 01/16/23-01/19/23 s/p fall, left sided rib pain, CT head/cervical spine showed no acute intracranial process, negative Xray hips. She was found to be in new onset Afib, cardiology: no evidence of Afib, probably wandering atrial pacer.  K 2.2<<2.9 01/19/23-supplemented,  CHF- BNP 265, EF 40-45%, started on Carvedilol, Losartan, Aldactone, off Furosemide and Metolazone, may resume Furosemide if fluid  retention develops, may stop Amlodipine if Bp<100, f/u cardiology 02/08/23.COVID tested positive, asymptomatic, unremarkable CXR               Hypokalemia, K 4.9 01/25/23 Pemphigoid, legs, responded to sterid tx, 3 previous healed bullous pemphigoid areas re opened, serous drainage noted, no redness, warmth, or swelling Anemia, Vit B12 2430, Folate >40 01/16/23,  EPO 9.9 09/15/21, On Fe, Hgb 9.5 01/25/23, chronic, baseline Hgb 9s,  on Pantoprazole for GI protection, FOBT negative CHF/Edema BLE, chronic, minimal on Aldactone now, 07/2021 venous US LLE negative DVT, prn Furosemide available.  CKD Bun/creat 29/1.6 01/25/23,  saw nephrology OA, Hx of R ankle fx, R hip total hip arthroplasty 11/30/21, ambulates with walker, uses w/c for mobility since last hospitalization.              Her urinary frequency/leakage, uses pads, it seems better since taking Myrbetriq in pm per Urology             HTN, blood pressure is controlled, on Amlodipine started 07/07/21 by Nephrology, now taking Losartan, Carvedilol, may dc Amlodipine if Bp<100 per cardiology.              No constipation, taking Colace bid, prn MiraLax              Osteoporosis, off Alendronate due to CKD, 11/05/20 DEXA t score -2.8, desires delaying DEXA             Fecal incontinence prn Imodium   Past Medical History:  Diagnosis Date   Anemia    Chronic kidney disease  stage 3   GERD (gastroesophageal reflux disease)    Hypertension    Past Surgical History:  Procedure Laterality Date   ORIF ANKLE FRACTURE Right 01/13/2021   Procedure: OPEN TREATMENT OF RIGHT TRIMALLEOLAR ANKLE FRACTURE WITHOUT POSTERIOR FIXATION, POSSIBLE SYNDESMOSIS;  Surgeon: Terance Hart, MD;  Location: [facility] OR;  Service: Orthopedics;  Laterality: Right;   TOTAL HIP ARTHROPLASTY Right 12/02/2021   Procedure: TOTAL HIP ARTHROPLASTY;  Surgeon: Joen Laura, MD;  Location: [facility] ORS;  Service: Orthopedics;  Laterality: Right;    Allergies  Allergen Reactions    Penicillins Swelling and Other (See Comments)    Facial swelling; can tolerate cephalosporins    Ace Inhibitors Other (See Comments)    Reaction unknown   Flagyl [Metronidazole] Other (See Comments)    Reaction unknown    Allergies as of 02/01/2023       Reactions   Penicillins Swelling, Other (See Comments)   Facial swelling; can tolerate cephalosporins   Ace Inhibitors Other (See Comments)   Reaction unknown   Flagyl [metronidazole] Other (See Comments)   Reaction unknown        Medication List        Accurate as of February 01, 2023 11:59 PM. If you have any questions, ask your nurse or doctor.          acetaminophen 325 MG tablet Commonly known as: TYLENOL Take 2 tablets (650 mg total) by mouth every 6 (six) hours as needed for mild pain, fever or headache.   aspirin EC 81 MG tablet Take 81 mg by mouth daily. Monday, Wednesday and Friday   atorvastatin 80 MG tablet Commonly known as: LIPITOR Take 1 tablet (80 mg total) by mouth daily.   bacitracin 500 UNIT/GM ointment Apply 1 Application topically daily. Apply to Lower leg wounds topically one time a day for healed bullous pemphigoid areas   carvedilol 3.125 MG tablet Commonly known as: COREG Take 1 tablet (3.125 mg total) by mouth 2 (two) times daily with a meal.   cholecalciferol 25 MCG (1000 UNIT) tablet Commonly known as: VITAMIN D3 Take 1,000 Units by mouth every Monday, Wednesday, and Friday.   FeroSul 325 (65 Fe) MG tablet Generic drug: ferrous sulfate Take 1 tablet (325 mg total) by mouth daily with breakfast. MON, WED, FRI   furosemide 20 MG tablet Commonly known as: LASIX Take 1 tablet (20 mg total) by mouth daily as needed for edema or fluid.   guaiFENesin-dextromethorphan 100-10 MG/5ML syrup Commonly known as: ROBITUSSIN DM Take 10 mLs by mouth every 6 (six) hours as needed for cough.   ICAPS AREDS 2 PO Take 1 capsule by mouth daily with breakfast.   losartan 25 MG tablet Commonly  known as: COZAAR Take 1 tablet (25 mg total) by mouth daily.   melatonin 3 MG Tabs tablet Take 1 tablet (3 mg total) by mouth at bedtime.   Myrbetriq 25 MG Tb24 tablet Generic drug: mirabegron ER TAKE 2 TABLETS BY MOUTH ONCE  DAILY   pantoprazole 40 MG tablet Commonly known as: PROTONIX Take 1 tablet (40 mg total) by mouth daily.   potassium chloride SA 20 MEQ tablet Commonly known as: KLOR-CON M Take 1 tablet (20 mEq total) by mouth daily.   Sarna lotion Generic drug: camphor-menthol Apply 1 Application topically in the morning and at bedtime.   senna-docusate 8.6-50 MG tablet Commonly known as: Senokot-S Take 1 tablet by mouth at bedtime as needed for mild constipation.   spironolactone 25 MG tablet  Commonly known as: ALDACTONE Take 0.5 tablets (12.5 mg total) by mouth daily.   vitamin C 250 MG tablet Commonly known as: ASCORBIC ACID Take 1 tablet (250 mg total) by mouth daily.        Review of Systems  Constitutional:  Negative for appetite change, fatigue and fever.  HENT:  Positive for hearing loss. Negative for congestion and sore throat.   Eyes:  Negative for visual disturbance.  Respiratory:  Positive for cough. Negative for shortness of breath.        Occasional cough, scant greenish phlegm production.   Cardiovascular:  Negative for leg swelling.  Gastrointestinal:  Negative for abdominal pain and constipation.       Fecal incontinent.   Genitourinary:  Positive for frequency. Negative for dysuria and urgency.       Occasionally incontinent of urine. Urination average every 4 hrs during day 1x/night.   Musculoskeletal:  Positive for arthralgias and gait problem.       Right lower leg/ankle brace  Skin:  Positive for wound. Negative for color change.  Neurological:  Negative for speech difficulty, weakness and light-headedness.       Memory lapses.   Psychiatric/Behavioral:  Negative for behavioral problems and sleep disturbance. The patient is not  nervous/anxious.     Immunization History  Administered Date(s) Administered   Fluad Quad(high Dose 65+) 03/28/2019   H1N1 03/18/2009   Influenza, High Dose Seasonal PF 03/25/2017, 03/16/2018, 03/14/2021, 03/14/2022   Influenza,inj,quad, With Preservative 03/26/2020, 03/30/2021   Influenza-Unspecified 02/25/2011, 03/31/2012, 03/27/2013, 03/14/2014, 03/25/2016, 06/14/2017, 03/28/2019   Moderna SARS-COV2 Booster Vaccination 04/22/2020, 10/28/2020   Moderna Sars-Covid-2 Vaccination 06/18/2019, 07/16/2019   PFIZER(Purple Top)SARS-COV-2 Vaccination 03/03/2021   PPD Test 12/25/2020, 01/05/2021, 12/17/2021   Pfizer Covid-19 Vaccine Bivalent Booster 48yrs & up 03/03/2021   Pneumococcal Conjugate-13 09/10/2014   Pneumococcal Polysaccharide-23 11/03/2014   Pneumococcal-Unspecified 06/14/2017   Tdap 11/27/2019   Unspecified SARS-COV-2 Vaccination 04/22/2020, 10/28/2020   Zoster Recombinant(Shingrix) 08/27/2022, 08/27/2022   Pertinent  Health Maintenance Due  Topic Date Due   INFLUENZA VACCINE  01/13/2023   DEXA SCAN  Completed      06/17/2022    1:45 PM 07/01/2022   11:07 AM 09/08/2022    2:58 PM 09/23/2022    3:00 PM 11/05/2022    1:51 PM  Fall Risk  Falls in the past year? 0 0 0 0 0  Was there an injury with Fall? 0 0 0 0 0  Fall Risk Category Calculator 0 0 0 0 0  Fall Risk Category (Retired) Low      (RETIRED) Patient Fall Risk Level Low fall risk      Patient at Risk for Falls Due to No Fall Risks No Fall Risks No Fall Risks No Fall Risks No Fall Risks  Fall risk Follow up Falls evaluation completed Falls evaluation completed Falls evaluation completed Falls evaluation completed Falls evaluation completed   Functional Status Survey:    Vitals:   02/01/23 1444  BP: (!) 150/82  Pulse: 78  Resp: 18  Temp: (!) 97.1 F (36.2 C)  SpO2: 90%  Weight: 114 lb 9.6 oz (52 kg)  Height: 5' 5.5" (1.664 m)   Body mass index is 18.78 kg/m. Physical Exam Vitals reviewed.   Constitutional:      Appearance: Normal appearance.  HENT:     Head: Normocephalic and atraumatic.     Nose: Nose normal.     Mouth/Throat:     Mouth: Mucous membranes are moist.  Eyes:  Extraocular Movements: Extraocular movements intact.     Conjunctiva/sclera: Conjunctivae normal.     Pupils: Pupils are equal, round, and reactive to light.  Cardiovascular:     Rate and Rhythm: Normal rate and regular rhythm.     Heart sounds: No murmur heard. Pulmonary:     Effort: Pulmonary effort is normal.     Breath sounds: Rales present. No wheezing or rhonchi.     Comments: Bibasilar rales.  Abdominal:     General: Bowel sounds are normal.     Palpations: Abdomen is soft.     Tenderness: There is no abdominal tenderness.  Musculoskeletal:        General: No tenderness.     Cervical back: Normal range of motion and neck supple.     Right lower leg: No edema.     Left lower leg: No edema.     Comments: R ankle s/p ORIF 01/13/21. THR 11/2021  Skin:    General: Skin is warm and dry.     Comments: Chronic pigmented venous insufficiency skin changes BLE. Skin tear left forearm, multiple scabbed over areas BLE re opened with serous drainage, no redness, warmth, or swelling.   Neurological:     Mental Status: She is alert. Mental status is at baseline.     Gait: Gait abnormal.     Comments: Oriented to person, place.   Psychiatric:        Mood and Affect: Mood normal.        Behavior: Behavior normal.        Thought Content: Thought content normal.     Labs reviewed: Recent Labs    01/16/23 2032 01/17/23 0403 01/17/23 1645 01/18/23 0419 01/19/23 0933 01/22/23 0000 01/25/23 0000  NA 130*   < > 134* 133* 129* 129* 132*  K <2.0*   < > 2.9* 4.1 2.9* 4.2 4.9  CL 87*   < > 95* 93* 92* 97* 99  CO2 30   < > 28 29 24 20  26*  GLUCOSE 106*   < > 106* 111* 125*  --   --   BUN 36*   < > 35* 33* 30* 29*  --   CREATININE 1.95*   < > 1.80* 2.00* 1.76* 1.5* 1.6*  CALCIUM 9.1   < > 8.5*  9.0 8.9 9.1 9.2  MG 1.9  --   --  1.7  --   --   --   PHOS  --   --   --  2.3*  --   --   --    < > = values in this interval not displayed.   Recent Labs    01/16/23 1125 01/17/23 0403 01/25/23 0000  AST 30 31 16   ALT 13 17 13   ALKPHOS 65 54 73  BILITOT 0.5 0.6  --   PROT 6.7 5.5*  --   ALBUMIN 4.0 3.0* 3.4*   Recent Labs    01/16/23 1125 01/17/23 0403 01/18/23 0419 01/25/23 0000  WBC 8.0 6.5 7.3 9.1  NEUTROABS 4.9  --   --  6,270.00  HGB 10.9* 9.8* 9.9* 9.5*  HCT 31.8* 30.2* 30.4* 28*  MCV 93.8 98.7 98.4  --   PLT 385 318 318 268   Lab Results  Component Value Date   TSH 0.862 01/16/2023   No results found for: "HGBA1C" Lab Results  Component Value Date   CHOL 174 01/16/2023   HDL 40 (L) 01/16/2023   LDLCALC 120 (H) 01/16/2023  TRIG 69 01/16/2023   CHOLHDL 4.4 01/16/2023    Significant Diagnostic Results in last 30 days:  ECHOCARDIOGRAM COMPLETE  Result Date: 01/17/2023    ECHOCARDIOGRAM REPORT   Patient Name:   YANNIS GROSSMAN Date of Exam: 01/17/2023 Medical Rec #:  [id number]          Height:       65.5 in Accession #:    [id number]         Weight:       120.4 lb Date of Birth:  1928/08/21         BSA:          1.603 m Patient Age:    89 years           BP:           122/73 mmHg Patient Gender: F                  HR:           78 bpm. Exam Location:  Inpatient Procedure: 2D Echo, Cardiac Doppler and Color Doppler Indications:    I48.92* Unspecified atrial flutter  History:        Patient has no prior history of Echocardiogram examinations.                 Risk Factors:Hypertension.  Sonographer:    Harriette Bouillon RDCS Referring Phys: [id number] ALEXIS HUGELMEYER IMPRESSIONS  1. LVEF challenging with atrial flutter. Left ventricular ejection fraction, by estimation, is 40 to 45%. The left ventricle has mildly decreased function. The left ventricle demonstrates global hypokinesis. Left ventricular diastolic parameters are consistent with Grade I diastolic dysfunction  (impaired relaxation).  2. Right ventricular systolic function was not well visualized. The right ventricular size is not well visualized. There is normal pulmonary artery systolic pressure.  3. Left atrial size was moderately dilated.  4. Right atrial size was moderately dilated.  5. The mitral valve is degenerative. Trivial mitral valve regurgitation.  6. The aortic valve was not well visualized. Aortic valve regurgitation is mild.  7. There is mild dilatation of the aortic root, measuring 40 mm. FINDINGS  Left Ventricle: LVEF challenging with atrial flutter. Left ventricular ejection fraction, by estimation, is 40 to 45%. The left ventricle has mildly decreased function. The left ventricle demonstrates global hypokinesis. The left ventricular internal cavity size was normal in size. There is borderline asymmetric left ventricular hypertrophy of the basal-septal segment. Left ventricular diastolic parameters are consistent with Grade I diastolic dysfunction (impaired relaxation). Right Ventricle: The right ventricular size is not well visualized. Right ventricular systolic function was not well visualized. There is normal pulmonary artery systolic pressure. The tricuspid regurgitant velocity is 2.02 m/s, and with an assumed right  atrial pressure of 3 mmHg, the estimated right ventricular systolic pressure is 19.3 mmHg. Left Atrium: Left atrial size was moderately dilated. Right Atrium: Right atrial size was moderately dilated. Pericardium: There is no evidence of pericardial effusion. Mitral Valve: The mitral valve is degenerative in appearance. Mild to moderate mitral annular calcification. Trivial mitral valve regurgitation. Tricuspid Valve: Tricuspid valve regurgitation is trivial. Aortic Valve: The aortic valve was not well visualized. Aortic valve regurgitation is mild. Pulmonic Valve: Pulmonic valve regurgitation is not visualized. Aorta: The aortic root and ascending aorta are structurally normal, with no  evidence of dilitation. There is mild dilatation of the aortic root, measuring 40 mm. IAS/Shunts: The interatrial septum appears to be lipomatous. The interatrial septum was not well  visualized.  LEFT VENTRICLE PLAX 2D LVIDd:         5.10 cm LVIDs:         3.90 cm LV PW:         0.90 cm LV IVS:        0.90 cm LVOT diam:     2.10 cm LV SV:         49 LV SV Index:   30 LVOT Area:     3.46 cm  RIGHT VENTRICLE         IVC TAPSE (M-mode): 1.4 cm  IVC diam: 1.20 cm LEFT ATRIUM             Index LA diam:        3.40 cm 2.12 cm/m LA Vol (A2C):   28.8 ml 17.96 ml/m LA Vol (A4C):   57.6 ml 35.93 ml/m LA Biplane Vol: 42.1 ml 26.26 ml/m  AORTIC VALVE LVOT Vmax:   85.33 cm/s LVOT Vmean:  56.333 cm/s LVOT VTI:    0.141 m  AORTA Ao Root diam: 4.00 cm Ao Asc diam:  3.50 cm MITRAL VALVE                TRICUSPID VALVE MV Area (PHT): 3.99 cm     TR Peak grad:   16.3 mmHg MV Decel Time: 190 msec     TR Vmax:        202.00 cm/s MV E velocity: 49.00 cm/s MV A velocity: 109.00 cm/s  SHUNTS MV E/A ratio:  0.45         Systemic VTI:  0.14 m                             Systemic Diam: 2.10 cm Carolan Clines Electronically signed by Carolan Clines Signature Date/Time: 01/17/2023/12:52:38 PM    Final    DG Forearm Left  Result Date: 01/16/2023 CLINICAL DATA:  Fall.  Pain. EXAM: LEFT FOREARM - 2 VIEW COMPARISON:  None Available. FINDINGS: There is no evidence of fracture or other focal bone lesions. Soft tissues are unremarkable. IMPRESSION: Negative. Electronically Signed   By: Paulina Fusi M.D.   On: 01/16/2023 12:10   DG Hips Bilat W or Wo Pelvis 3-4 Views  Result Date: 01/16/2023 CLINICAL DATA:  Larey Seat.  Hip and pelvic pain. EXAM: DG HIP (WITH OR WITHOUT PELVIS) 3-4V BILAT COMPARISON:  12/02/2021 FINDINGS: Previous hip replacement on the right. No unexpected finding relative to that. No evidence of pelvic fracture. The left hip shows mild degenerative arthritis but no acute traumatic finding. Calcified leiomyoma incidentally noted in the  central pelvis. IMPRESSION: No acute or traumatic finding. Previous right hip replacement. Electronically Signed   By: Paulina Fusi M.D.   On: 01/16/2023 12:09   DG Ribs Unilateral W/Chest Left  Result Date: 01/16/2023 CLINICAL DATA:  Fall with left arm pain and left chest pain EXAM: LEFT RIBS AND CHEST - 3+ VIEW COMPARISON:  11/30/2021 FINDINGS: Hiatal hernia. Heart size is normal. Tortuous aorta. The lungs are clear. No pneumothorax or hemothorax. Left rib films do not show a fracture. Chronic degenerative changes affect the shoulders. Scoliosis of the spine. IMPRESSION: No acute or traumatic finding. Hiatal hernia. Tortuous aorta. Scoliosis. Chronic degenerative changes of the shoulders. Electronically Signed   By: Paulina Fusi M.D.   On: 01/16/2023 12:08   CT Head Wo Contrast  Result Date: 01/16/2023 CLINICAL DATA:  Fall out of bed with head  and neck pain. EXAM: CT HEAD WITHOUT CONTRAST CT CERVICAL SPINE WITHOUT CONTRAST TECHNIQUE: Multidetector CT imaging of the head and cervical spine was performed following the standard protocol without intravenous contrast. Multiplanar CT image reconstructions of the cervical spine were also generated. RADIATION DOSE REDUCTION: This exam was performed according to the departmental dose-optimization program which includes automated exposure control, adjustment of the mA and/or kV according to patient size and/or use of iterative reconstruction technique. COMPARISON:  None Available. FINDINGS: CT HEAD FINDINGS Brain: No evidence of acute infarction, hemorrhage, hydrocephalus, extra-axial collection or mass lesion/mass effect. There is mild cerebral volume loss with associated ex vacuo dilatation. Periventricular white matter hypoattenuation likely represents chronic small vessel ischemic disease. Vascular: There are vascular calcifications in the carotid siphons. Skull: Normal. Negative for fracture or focal lesion. Sinuses/Orbits: No acute finding. Other: None. CT  CERVICAL SPINE FINDINGS Alignment: No traumatic listhesis. Skull base and vertebrae: No acute fracture. No primary bone lesion or focal pathologic process. Soft tissues and spinal canal: No prevertebral fluid or swelling. No visible canal hematoma. Disc levels: Moderate to severe multilevel degenerative disc and joint disease. Upper chest: Negative. Other: None. IMPRESSION: 1. No acute intracranial process. 2. No acute osseous injury in the cervical spine. Electronically Signed   By: Romona Curls M.D.   On: 01/16/2023 11:53   CT Cervical Spine Wo Contrast  Result Date: 01/16/2023 CLINICAL DATA:  Fall out of bed with head and neck pain. EXAM: CT HEAD WITHOUT CONTRAST CT CERVICAL SPINE WITHOUT CONTRAST TECHNIQUE: Multidetector CT imaging of the head and cervical spine was performed following the standard protocol without intravenous contrast. Multiplanar CT image reconstructions of the cervical spine were also generated. RADIATION DOSE REDUCTION: This exam was performed according to the departmental dose-optimization program which includes automated exposure control, adjustment of the mA and/or kV according to patient size and/or use of iterative reconstruction technique. COMPARISON:  None Available. FINDINGS: CT HEAD FINDINGS Brain: No evidence of acute infarction, hemorrhage, hydrocephalus, extra-axial collection or mass lesion/mass effect. There is mild cerebral volume loss with associated ex vacuo dilatation. Periventricular white matter hypoattenuation likely represents chronic small vessel ischemic disease. Vascular: There are vascular calcifications in the carotid siphons. Skull: Normal. Negative for fracture or focal lesion. Sinuses/Orbits: No acute finding. Other: None. CT CERVICAL SPINE FINDINGS Alignment: No traumatic listhesis. Skull base and vertebrae: No acute fracture. No primary bone lesion or focal pathologic process. Soft tissues and spinal canal: No prevertebral fluid or swelling. No visible  canal hematoma. Disc levels: Moderate to severe multilevel degenerative disc and joint disease. Upper chest: Negative. Other: None. IMPRESSION: 1. No acute intracranial process. 2. No acute osseous injury in the cervical spine. Electronically Signed   By: Romona Curls M.D.   On: 01/16/2023 11:53    Assessment/Plan Pemphigoid Pemphigoid, legs, responded to sterid tx, 3 previous healed bullous pemphigoid areas re opened, serous drainage noted, no redness, warmth, or swelling, will resume Prednisone 10mg  every day x 10 days, f/u Dermatology 02/23/23  Anemia Vit B12 2430, Folate >40 01/16/23,  EPO 9.9 09/15/21, On Fe, Hgb 9.5 01/25/23, chronic, baseline Hgb 9s,  on Pantoprazole for GI protection, FOBT negative  Venous insufficiency (chronic) (peripheral)  chronic, minimal on Aldactone now, 07/2021 venous US LLE negative DVT, prn Furosemide available.   CKD (chronic kidney disease) stage 4, GFR 15-29 ml/min (HCC) Bun/creat 29/1.6 01/25/23,  saw nephrology  Osteoarthritis, multiple sites Hx of R ankle fx, R hip total hip arthroplasty 11/30/21, ambulates with walker, uses w/c  more since last hospitalization  Incontinent of urine  Her urinary frequency/leakage, uses pads, it seems better since taking Myrbetriq in pm per Urology  Hypertension blood pressure is controlled, on Amlodipine started 07/07/21 by Nephrology, now taking Losartan, Carvedilol, may dc Amlodipine if Bp<100 per cardiology.   Congestive heart failure (CHF) (HCC)  CHF- BNP 265, EF 40-45%, started on Carvedilol, Losartan, Aldactone, off Furosemide and Metolazone,   Family/ staff Communication: plan of care reviewed with the patient and charge nurse.   Labs/tests ordered:   none  Time spend 25 minutes.

## 2023-02-01 NOTE — Assessment & Plan Note (Signed)
CHF- BNP 265, EF 40-45%, started on Carvedilol, Losartan, Aldactone, off Furosemide and Metolazone,

## 2023-02-01 NOTE — Assessment & Plan Note (Signed)
Bun/creat 29/1.6 01/25/23,  saw nephrology

## 2023-02-01 NOTE — Assessment & Plan Note (Signed)
 blood pressure is controlled, on Amlodipine started 07/07/21 by Nephrology, now taking Losartan, Carvedilol, may dc Amlodipine if Bp<100 per cardiology.

## 2023-02-01 NOTE — Assessment & Plan Note (Signed)
Vit B12 2430, Folate >40 01/16/23,  EPO 9.9 09/15/21, On Fe, Hgb 9.5 01/25/23, chronic, baseline Hgb 9s,  on Pantoprazole for GI protection, FOBT negative

## 2023-02-01 NOTE — Assessment & Plan Note (Signed)
Her urinary frequency/leakage, uses pads, it seems better since taking Myrbetriq in pm per Urology ?

## 2023-02-01 NOTE — Assessment & Plan Note (Signed)
Hx of R ankle fx, R hip total hip arthroplasty 11/30/21, ambulates with walker, uses w/c more since last hospitalization

## 2023-02-03 DIAGNOSIS — R454 Irritability and anger: Secondary | ICD-10-CM | POA: Insufficient documentation

## 2023-02-03 NOTE — Assessment & Plan Note (Signed)
Emotional outburst, irritable, off her baseline, will update CBC/diff, CMP/eGFR

## 2023-02-04 ENCOUNTER — Non-Acute Institutional Stay (SKILLED_NURSING_FACILITY): Payer: Medicare Other | Admitting: Orthopedic Surgery

## 2023-02-04 ENCOUNTER — Encounter: Payer: Self-pay | Admitting: Orthopedic Surgery

## 2023-02-04 DIAGNOSIS — D649 Anemia, unspecified: Secondary | ICD-10-CM | POA: Diagnosis not present

## 2023-02-04 NOTE — Progress Notes (Unsigned)
Location:  [facility] Room Number: 2/B Place of Service:  SNF (31) Provider:  Octavia Heir, NP   Mast, Man X, NP  Patient Care Team: Mast, Man X, NP as PCP - General (Internal Medicine) [name], MD as PCP - Cardiology (Cardiology)  Extended Emergency Contact Information Primary Emergency Contact: Haberl,[name] Address: [address]          [place], [place] [address] [name] of [place] Home Phone: [phone] Work Phone: [phone] Mobile Phone: [phone] Relation: Daughter Secondary Emergency Contact: [name],[name] Home Phone: [phone] Relation: Daughter  Code Status:  DNR Goals of care: Advanced Directive information    02/01/2023    2:48 PM  Advanced Directives  Does Patient Have a Medical Advance Directive? Yes  Type of Estate agent of [place];Out of facility DNR (pink MOST or yellow form)  Does patient want to make changes to medical advance directive? No - Patient declined  Copy of Healthcare Power of Attorney in Chart? Yes - validated most recent copy scanned in chart (See row information)  Pre-existing out of facility DNR order (yellow form or pink MOST form) Pink MOST form placed in chart (order not valid for inpatient use);Yellow form placed in chart (order not valid for inpatient use)     Chief Complaint  Patient presents with   Acute Visit    Low hemoglobin    HPI:  Pt is a 89 y.o. female seen today for acute visit due to low hemoglobin.   She was recently admitted to skilled nursing unit at [facility]. PMH: CHF, HTN, venous insufficiency, constipation, OA, CKD, pemphigoid, malnutrition, gait abnormality, incontinence and anemia.   H/o anemia with hgb averaging around 9. Recent lab results reveal hgb 7.8. She is taking ferrous sulfate 3x/week. Nursing denies black tarry stools or hematuria. Past hemoccults negative for blood. Afebrile. No tachycardia,  hypotension, or dizziness.    Past Medical History:  Diagnosis Date   Anemia    Chronic kidney disease    stage 3   GERD (gastroesophageal reflux disease)    Hypertension    Past Surgical History:  Procedure Laterality Date   ORIF ANKLE FRACTURE Right 01/13/2021   Procedure: OPEN TREATMENT OF RIGHT TRIMALLEOLAR ANKLE FRACTURE WITHOUT POSTERIOR FIXATION, POSSIBLE SYNDESMOSIS;  Surgeon: Terance Hart, MD;  Location: [facility] OR;  Service: Orthopedics;  Laterality: Right;   TOTAL HIP ARTHROPLASTY Right 12/02/2021   Procedure: TOTAL HIP ARTHROPLASTY;  Surgeon: Joen Laura, MD;  Location: [facility] ORS;  Service: Orthopedics;  Laterality: Right;    Allergies  Allergen Reactions   Penicillins Swelling and Other (See Comments)    Facial swelling; can tolerate cephalosporins    Ace Inhibitors Other (See Comments)    Reaction unknown   Flagyl [Metronidazole] Other (See Comments)    Reaction unknown    Outpatient Encounter Medications as of 02/04/2023  Medication Sig   acetaminophen (TYLENOL) 325 MG tablet Take 2 tablets (650 mg total) by mouth every 6 (six) hours as needed for mild pain, fever or headache.   aspirin EC 81 MG tablet Take 81 mg by mouth daily. Monday, Wednesday and Friday   atorvastatin (LIPITOR) 80 MG tablet Take 1 tablet (80 mg total) by mouth daily.   bacitracin 500 UNIT/GM ointment Apply 1 Application topically daily. Apply to Lower leg wounds topically one time a day for healed bullous pemphigoid areas   camphor-menthol (SARNA) lotion Apply 1 Application topically in the morning and at bedtime.  carvedilol (COREG) 3.125 MG tablet Take 1 tablet (3.125 mg total) by mouth 2 (two) times daily with a meal.   cholecalciferol (VITAMIN D3) 25 MCG (1000 UNIT) tablet Take 1,000 Units by mouth every Monday, Wednesday, and Friday.   ferrous sulfate (FEROSUL) 325 (65 FE) MG tablet Take 1 tablet (325 mg total) by mouth daily with breakfast. MON, WED, FRI   furosemide (LASIX) 20  MG tablet Take 1 tablet (20 mg total) by mouth daily as needed for edema or fluid.   guaiFENesin-dextromethorphan (ROBITUSSIN DM) 100-10 MG/5ML syrup Take 10 mLs by mouth every 6 (six) hours as needed for cough.   losartan (COZAAR) 25 MG tablet Take 1 tablet (25 mg total) by mouth daily.   melatonin 3 MG TABS tablet Take 1 tablet (3 mg total) by mouth at bedtime.   Multiple Vitamins-Minerals (ICAPS AREDS 2 PO) Take 1 capsule by mouth daily with breakfast.   MYRBETRIQ 25 MG TB24 tablet TAKE 2 TABLETS BY MOUTH ONCE  DAILY   pantoprazole (PROTONIX) 40 MG tablet Take 1 tablet (40 mg total) by mouth daily.   potassium chloride SA (KLOR-CON M) 20 MEQ tablet Take 1 tablet (20 mEq total) by mouth daily.   senna-docusate (SENOKOT-S) 8.6-50 MG tablet Take 1 tablet by mouth at bedtime as needed for mild constipation.   spironolactone (ALDACTONE) 25 MG tablet Take 0.5 tablets (12.5 mg total) by mouth daily.   vitamin C (ASCORBIC ACID) 250 MG tablet Take 1 tablet (250 mg total) by mouth daily.   No facility-administered encounter medications on file as of 02/04/2023.    Review of Systems  Constitutional:  Positive for fatigue. Negative for activity change and appetite change.  Respiratory:  Negative for cough, shortness of breath and wheezing.   Cardiovascular:  Negative for chest pain and leg swelling.  Gastrointestinal:  Negative for blood in stool and vomiting.  Genitourinary:  Negative for hematuria.  Musculoskeletal:  Positive for gait problem.  Neurological:  Positive for weakness. Negative for dizziness.  Psychiatric/Behavioral:  Positive for confusion. Negative for dysphoric mood. The patient is not nervous/anxious.     Immunization History  Administered Date(s) Administered   Fluad Quad(high Dose 65+) 03/28/2019   H1N1 03/18/2009   Influenza, High Dose Seasonal PF 03/25/2017, 03/16/2018, 03/14/2021, 03/14/2022   Influenza,inj,quad, With Preservative 03/26/2020, 03/30/2021    Influenza-Unspecified 02/25/2011, 03/31/2012, 03/27/2013, 03/14/2014, 03/25/2016, 06/14/2017, 03/28/2019   Moderna SARS-COV2 Booster Vaccination 04/22/2020, 10/28/2020   Moderna Sars-Covid-2 Vaccination 06/18/2019, 07/16/2019   PFIZER(Purple Top)SARS-COV-2 Vaccination 03/03/2021   PPD Test 12/25/2020, 01/05/2021, 12/17/2021   Pfizer Covid-19 Vaccine Bivalent Booster 78yrs & up 03/03/2021   Pneumococcal Conjugate-13 09/10/2014   Pneumococcal Polysaccharide-23 11/03/2014   Pneumococcal-Unspecified 06/14/2017   Tdap 11/27/2019   Unspecified SARS-COV-2 Vaccination 04/22/2020, 10/28/2020   Zoster Recombinant(Shingrix) 08/27/2022, 08/27/2022   Pertinent  Health Maintenance Due  Topic Date Due   INFLUENZA VACCINE  01/13/2023   DEXA SCAN  Completed      06/17/2022    1:45 PM 07/01/2022   11:07 AM 09/08/2022    2:58 PM 09/23/2022    3:00 PM 11/05/2022    1:51 PM  Fall Risk  Falls in the past year? 0 0 0 0 0  Was there an injury with Fall? 0 0 0 0 0  Fall Risk Category Calculator 0 0 0 0 0  Fall Risk Category (Retired) Low      (RETIRED) Patient Fall Risk Level Low fall risk      Patient at Risk for  Falls Due to No Fall Risks No Fall Risks No Fall Risks No Fall Risks No Fall Risks  Fall risk Follow up Falls evaluation completed Falls evaluation completed Falls evaluation completed Falls evaluation completed Falls evaluation completed   Functional Status Survey:    Vitals:   02/04/23 1652  BP: (!) 156/87  Pulse: 78  Resp: 20  Temp: 97.7 F (36.5 C)  SpO2: 97%  Weight: 118 lb 4.8 oz (53.7 kg)  Height: 5\' 6"  (1.676 m)   Body mass index is 19.09 kg/m. Physical Exam Vitals reviewed.  Constitutional:      General: She is not in acute distress. HENT:     Head: Normocephalic.  Eyes:     General:        Right eye: No discharge.        Left eye: No discharge.  Cardiovascular:     Rate and Rhythm: Normal rate and regular rhythm.     Pulses: Normal pulses.     Heart sounds: Normal  heart sounds.  Pulmonary:     Effort: Pulmonary effort is normal. No respiratory distress.     Breath sounds: Normal breath sounds. No wheezing.  Abdominal:     General: Bowel sounds are normal.     Palpations: Abdomen is soft.  Musculoskeletal:     Cervical back: Neck supple.     Right lower leg: No edema.     Left lower leg: No edema.  Skin:    General: Skin is warm.     Capillary Refill: Capillary refill takes less than 2 seconds.  Neurological:     General: No focal deficit present.     Mental Status: She is alert. Mental status is at baseline.     Motor: Weakness present.     Gait: Gait abnormal.  Psychiatric:        Mood and Affect: Mood normal.     Labs reviewed: Recent Labs    01/16/23 2032 01/17/23 0403 01/17/23 1645 01/18/23 0419 01/19/23 0933 01/22/23 0000 01/25/23 0000  NA 130*   < > 134* 133* 129* 129* 132*  K <2.0*   < > 2.9* 4.1 2.9* 4.2 4.9  CL 87*   < > 95* 93* 92* 97* 99  CO2 30   < > 28 29 24 20  26*  GLUCOSE 106*   < > 106* 111* 125*  --   --   BUN 36*   < > 35* 33* 30* 29*  --   CREATININE 1.95*   < > 1.80* 2.00* 1.76* 1.5* 1.6*  CALCIUM 9.1   < > 8.5* 9.0 8.9 9.1 9.2  MG 1.9  --   --  1.7  --   --   --   PHOS  --   --   --  2.3*  --   --   --    < > = values in this interval not displayed.   Recent Labs    01/16/23 1125 01/17/23 0403 01/25/23 0000  AST 30 31 16   ALT 13 17 13   ALKPHOS 65 54 73  BILITOT 0.5 0.6  --   PROT 6.7 5.5*  --   ALBUMIN 4.0 3.0* 3.4*   Recent Labs    01/16/23 1125 01/17/23 0403 01/18/23 0419 01/25/23 0000  WBC 8.0 6.5 7.3 9.1  NEUTROABS 4.9  --   --  6,270.00  HGB 10.9* 9.8* 9.9* 9.5*  HCT 31.8* 30.2* 30.4* 28*  MCV 93.8 98.7 98.4  --  PLT 385 318 318 268   Lab Results  Component Value Date   TSH 0.862 01/16/2023   No results found for: "HGBA1C" Lab Results  Component Value Date   CHOL 174 01/16/2023   HDL 40 (L) 01/16/2023   LDLCALC 120 (H) 01/16/2023   TRIG 69 01/16/2023   CHOLHDL 4.4  01/16/2023    Significant Diagnostic Results in last 30 days:  ECHOCARDIOGRAM COMPLETE  Result Date: 01/17/2023    ECHOCARDIOGRAM REPORT   Patient Name:   Peggy [name] Date of Exam: 01/17/2023 Medical Rec #:  [id number]          Height:       65.5 in Accession #:    [id number]         Weight:       120.4 lb Date of Birth:  10-Apr-1929         BSA:          1.603 m Patient Age:    89 years           BP:           122/73 mmHg Patient Gender: F                  HR:           78 bpm. Exam Location:  Inpatient Procedure: 2D Echo, Cardiac Doppler and Color Doppler Indications:    I48.92* Unspecified atrial flutter  History:        Patient has no prior history of Echocardiogram examinations.                 Risk Factors:Hypertension.  Sonographer:    Harriette Bouillon RDCS Referring Phys: [id number] ALEXIS HUGELMEYER IMPRESSIONS  1. LVEF challenging with atrial flutter. Left ventricular ejection fraction, by estimation, is 40 to 45%. The left ventricle has mildly decreased function. The left ventricle demonstrates global hypokinesis. Left ventricular diastolic parameters are consistent with Grade I diastolic dysfunction (impaired relaxation).  2. Right ventricular systolic function was not well visualized. The right ventricular size is not well visualized. There is normal pulmonary artery systolic pressure.  3. Left atrial size was moderately dilated.  4. Right atrial size was moderately dilated.  5. The mitral valve is degenerative. Trivial mitral valve regurgitation.  6. The aortic valve was not well visualized. Aortic valve regurgitation is mild.  7. There is mild dilatation of the aortic root, measuring 40 mm. FINDINGS  Left Ventricle: LVEF challenging with atrial flutter. Left ventricular ejection fraction, by estimation, is 40 to 45%. The left ventricle has mildly decreased function. The left ventricle demonstrates global hypokinesis. The left ventricular internal cavity size was normal in size. There is borderline  asymmetric left ventricular hypertrophy of the basal-septal segment. Left ventricular diastolic parameters are consistent with Grade I diastolic dysfunction (impaired relaxation). Right Ventricle: The right ventricular size is not well visualized. Right ventricular systolic function was not well visualized. There is normal pulmonary artery systolic pressure. The tricuspid regurgitant velocity is 2.02 m/s, and with an assumed right  atrial pressure of 3 mmHg, the estimated right ventricular systolic pressure is 19.3 mmHg. Left Atrium: Left atrial size was moderately dilated. Right Atrium: Right atrial size was moderately dilated. Pericardium: There is no evidence of pericardial effusion. Mitral Valve: The mitral valve is degenerative in appearance. Mild to moderate mitral annular calcification. Trivial mitral valve regurgitation. Tricuspid Valve: Tricuspid valve regurgitation is trivial. Aortic Valve: The aortic valve was not well visualized. Aortic valve regurgitation  is mild. Pulmonic Valve: Pulmonic valve regurgitation is not visualized. Aorta: The aortic root and ascending aorta are structurally normal, with no evidence of dilitation. There is mild dilatation of the aortic root, measuring 40 mm. IAS/Shunts: The interatrial septum appears to be lipomatous. The interatrial septum was not well visualized.  LEFT VENTRICLE PLAX 2D LVIDd:         5.10 cm LVIDs:         3.90 cm LV PW:         0.90 cm LV IVS:        0.90 cm LVOT diam:     2.10 cm LV SV:         49 LV SV Index:   30 LVOT Area:     3.46 cm  RIGHT VENTRICLE         IVC TAPSE (M-mode): 1.4 cm  IVC diam: 1.20 cm LEFT ATRIUM             Index LA diam:        3.40 cm 2.12 cm/m LA Vol (A2C):   28.8 ml 17.96 ml/m LA Vol (A4C):   57.6 ml 35.93 ml/m LA Biplane Vol: 42.1 ml 26.26 ml/m  AORTIC VALVE LVOT Vmax:   85.33 cm/s LVOT Vmean:  56.333 cm/s LVOT VTI:    0.141 m  AORTA Ao Root diam: 4.00 cm Ao Asc diam:  3.50 cm MITRAL VALVE                TRICUSPID VALVE  MV Area (PHT): 3.99 cm     TR Peak grad:   16.3 mmHg MV Decel Time: 190 msec     TR Vmax:        202.00 cm/s MV E velocity: 49.00 cm/s MV A velocity: 109.00 cm/s  SHUNTS MV E/A ratio:  0.45         Systemic VTI:  0.14 m                             Systemic Diam: 2.10 cm Carolan Clines Electronically signed by Carolan Clines Signature Date/Time: 01/17/2023/12:52:38 PM    Final    DG Forearm Left  Result Date: 01/16/2023 CLINICAL DATA:  Fall.  Pain. EXAM: LEFT FOREARM - 2 VIEW COMPARISON:  None Available. FINDINGS: There is no evidence of fracture or other focal bone lesions. Soft tissues are unremarkable. IMPRESSION: Negative. Electronically Signed   By: Paulina Fusi M.D.   On: 01/16/2023 12:10   DG Hips Bilat W or Wo Pelvis 3-4 Views  Result Date: 01/16/2023 CLINICAL DATA:  Larey Seat.  Hip and pelvic pain. EXAM: DG HIP (WITH OR WITHOUT PELVIS) 3-4V BILAT COMPARISON:  12/02/2021 FINDINGS: Previous hip replacement on the right. No unexpected finding relative to that. No evidence of pelvic fracture. The left hip shows mild degenerative arthritis but no acute traumatic finding. Calcified leiomyoma incidentally noted in the central pelvis. IMPRESSION: No acute or traumatic finding. Previous right hip replacement. Electronically Signed   By: Paulina Fusi M.D.   On: 01/16/2023 12:09   DG Ribs Unilateral W/Chest Left  Result Date: 01/16/2023 CLINICAL DATA:  Fall with left arm pain and left chest pain EXAM: LEFT RIBS AND CHEST - 3+ VIEW COMPARISON:  11/30/2021 FINDINGS: Hiatal hernia. Heart size is normal. Tortuous aorta. The lungs are clear. No pneumothorax or hemothorax. Left rib films do not show a fracture. Chronic degenerative changes affect the shoulders. Scoliosis of the spine.  IMPRESSION: No acute or traumatic finding. Hiatal hernia. Tortuous aorta. Scoliosis. Chronic degenerative changes of the shoulders. Electronically Signed   By: Paulina Fusi M.D.   On: 01/16/2023 12:08   CT Head Wo Contrast  Result Date:  01/16/2023 CLINICAL DATA:  Fall out of bed with head and neck pain. EXAM: CT HEAD WITHOUT CONTRAST CT CERVICAL SPINE WITHOUT CONTRAST TECHNIQUE: Multidetector CT imaging of the head and cervical spine was performed following the standard protocol without intravenous contrast. Multiplanar CT image reconstructions of the cervical spine were also generated. RADIATION DOSE REDUCTION: This exam was performed according to the departmental dose-optimization program which includes automated exposure control, adjustment of the mA and/or kV according to patient size and/or use of iterative reconstruction technique. COMPARISON:  None Available. FINDINGS: CT HEAD FINDINGS Brain: No evidence of acute infarction, hemorrhage, hydrocephalus, extra-axial collection or mass lesion/mass effect. There is mild cerebral volume loss with associated ex vacuo dilatation. Periventricular white matter hypoattenuation likely represents chronic small vessel ischemic disease. Vascular: There are vascular calcifications in the carotid siphons. Skull: Normal. Negative for fracture or focal lesion. Sinuses/Orbits: No acute finding. Other: None. CT CERVICAL SPINE FINDINGS Alignment: No traumatic listhesis. Skull base and vertebrae: No acute fracture. No primary bone lesion or focal pathologic process. Soft tissues and spinal canal: No prevertebral fluid or swelling. No visible canal hematoma. Disc levels: Moderate to severe multilevel degenerative disc and joint disease. Upper chest: Negative. Other: None. IMPRESSION: 1. No acute intracranial process. 2. No acute osseous injury in the cervical spine. Electronically Signed   By: Romona Curls M.D.   On: 01/16/2023 11:53   CT Cervical Spine Wo Contrast  Result Date: 01/16/2023 CLINICAL DATA:  Fall out of bed with head and neck pain. EXAM: CT HEAD WITHOUT CONTRAST CT CERVICAL SPINE WITHOUT CONTRAST TECHNIQUE: Multidetector CT imaging of the head and cervical spine was performed following the standard  protocol without intravenous contrast. Multiplanar CT image reconstructions of the cervical spine were also generated. RADIATION DOSE REDUCTION: This exam was performed according to the departmental dose-optimization program which includes automated exposure control, adjustment of the mA and/or kV according to patient size and/or use of iterative reconstruction technique. COMPARISON:  None Available. FINDINGS: CT HEAD FINDINGS Brain: No evidence of acute infarction, hemorrhage, hydrocephalus, extra-axial collection or mass lesion/mass effect. There is mild cerebral volume loss with associated ex vacuo dilatation. Periventricular white matter hypoattenuation likely represents chronic small vessel ischemic disease. Vascular: There are vascular calcifications in the carotid siphons. Skull: Normal. Negative for fracture or focal lesion. Sinuses/Orbits: No acute finding. Other: None. CT CERVICAL SPINE FINDINGS Alignment: No traumatic listhesis. Skull base and vertebrae: No acute fracture. No primary bone lesion or focal pathologic process. Soft tissues and spinal canal: No prevertebral fluid or swelling. No visible canal hematoma. Disc levels: Moderate to severe multilevel degenerative disc and joint disease. Upper chest: Negative. Other: None. IMPRESSION: 1. No acute intracranial process. 2. No acute osseous injury in the cervical spine. Electronically Signed   By: Romona Curls M.D.   On: 01/16/2023 11:53    Assessment/Plan 1. Low hemoglobin - 08/23 hgb 7.8 - stat cbc/diff> rechecked hgb 8.5 - no sign of blood loss, past hemoccults negative - asymptomatic - h/o anemia with hgb averaging around 9  2. Anemia, unspecified type - see above - cont ferrous sulfate 3x/week    Family/ staff Communication: plan discussed with patient and nurse  Labs/tests ordered:  stat cbc/diff

## 2023-02-08 ENCOUNTER — Ambulatory Visit (HOSPITAL_BASED_OUTPATIENT_CLINIC_OR_DEPARTMENT_OTHER): Payer: Medicare Other | Admitting: Family

## 2023-02-08 ENCOUNTER — Encounter (HOSPITAL_BASED_OUTPATIENT_CLINIC_OR_DEPARTMENT_OTHER): Payer: Self-pay | Admitting: Family

## 2023-02-08 VITALS — BP 118/76 | HR 94 | Ht 66.0 in | Wt 122.0 lb

## 2023-02-08 DIAGNOSIS — I5022 Chronic systolic (congestive) heart failure: Secondary | ICD-10-CM

## 2023-02-08 DIAGNOSIS — E876 Hypokalemia: Secondary | ICD-10-CM

## 2023-02-08 DIAGNOSIS — E782 Mixed hyperlipidemia: Secondary | ICD-10-CM

## 2023-02-08 DIAGNOSIS — I872 Venous insufficiency (chronic) (peripheral): Secondary | ICD-10-CM | POA: Diagnosis not present

## 2023-02-08 DIAGNOSIS — I491 Atrial premature depolarization: Secondary | ICD-10-CM | POA: Diagnosis not present

## 2023-02-08 MED ORDER — ATORVASTATIN CALCIUM 20 MG PO TABS
80.0000 mg | ORAL_TABLET | Freq: Every day | ORAL | 3 refills | Status: DC
Start: 2023-02-08 — End: 2024-02-09

## 2023-02-08 MED ORDER — CARVEDILOL 3.125 MG PO TABS
3.1250 mg | ORAL_TABLET | Freq: Two times a day (BID) | ORAL | 3 refills | Status: DC
Start: 2023-02-08 — End: 2023-08-12

## 2023-02-08 MED ORDER — SPIRONOLACTONE 25 MG PO TABS
12.5000 mg | ORAL_TABLET | Freq: Every day | ORAL | 3 refills | Status: AC
Start: 2023-02-08 — End: ?

## 2023-02-08 MED ORDER — POTASSIUM CHLORIDE CRYS ER 20 MEQ PO TBCR
20.0000 meq | EXTENDED_RELEASE_TABLET | Freq: Every day | ORAL | 3 refills | Status: DC
Start: 2023-02-08 — End: 2023-08-12

## 2023-02-08 MED ORDER — FUROSEMIDE 20 MG PO TABS: 20.0000 mg | ORAL_TABLET | Freq: Every day | ORAL | 5 refills | Status: DC | PRN

## 2023-02-08 MED ORDER — LOSARTAN POTASSIUM 25 MG PO TABS
25.0000 mg | ORAL_TABLET | Freq: Every day | ORAL | 3 refills | Status: AC
Start: 2023-02-08 — End: ?

## 2023-02-08 NOTE — Progress Notes (Signed)
Cardiology Office Note:  .   Date:  02/08/2023  ID:  Peggy [name], DOB [birth year], MRN [id number] PCP: Mast, Man X, NP  Nephrology: Dr. [facility] Providers Cardiologist:  [name], MD    History of Present Illness: .   Peggy [name] is a 89 y.o. female with hx of HTN, CKD, GERD, anemia, chronic venous insufficiency, HFmrEF,   Admitted 01/16/23 after fall. Echo LVEF 40-45% (per note, likely LVEF 50-55%). GDMT discharge included Coreg 3.125mg  BID, Losartan 25mg  daily, Spironolactone 12.5mg  daily, Lasix PRN. Amlodipine stopped due to hypotension.   Presents today for follow up with her daughter. Her daughter notes some confusion and delerium since hospital discharge, per her report UA and labs by facility provider unremarkable. Notably worse later in the day. Reports no shortness of breath nor dyspnea on exertion. Reports no chest pain, pressure, or tightness. No edema, orthopnea, PND. Reports no palpitations.  Wearing compression stockings regularly during the daytime.   ROS: Please see the history of present illness.    All other systems reviewed and are negative.   Studies Reviewed: Marland Kitchen   EKG Interpretation Date/Time:  Tuesday February 08 2023 08:33:32 EDT Ventricular Rate:  94 PR Interval:  146 QRS Duration:  80 QT Interval:  348 QTC Calculation: 435 R Axis:   -24  Text Interpretation: Sinus rhythm with marked sinus arrhythmia Atrial premature complexes Confirmed by Gillian Shields ([id number]) on 02/08/2023 10:34:19 AM    Cardiac Studies & Procedures       ECHOCARDIOGRAM  ECHOCARDIOGRAM COMPLETE 01/17/2023  Narrative ECHOCARDIOGRAM REPORT    Patient Name:   Peggy [name] Date of Exam: 01/17/2023 Medical Rec #:  [id number]          Height:       65.5 in Accession #:    [id number]         Weight:       120.4 lb Date of Birth:  04/03/1929         BSA:          1.603 m Patient Age:    89 years           BP:           122/73 mmHg Patient  Gender: F                  HR:           78 bpm. Exam Location:  Inpatient  Procedure: 2D Echo, Cardiac Doppler and Color Doppler  Indications:    I48.92* Unspecified atrial flutter  History:        Patient has no prior history of Echocardiogram examinations. Risk Factors:Hypertension.  Sonographer:    Harriette Bouillon RDCS Referring Phys: [id number] ALEXIS HUGELMEYER  IMPRESSIONS   1. LVEF challenging with atrial flutter. Left ventricular ejection fraction, by estimation, is 40 to 45%. The left ventricle has mildly decreased function. The left ventricle demonstrates global hypokinesis. Left ventricular diastolic parameters are consistent with Grade I diastolic dysfunction (impaired relaxation). 2. Right ventricular systolic function was not well visualized. The right ventricular size is not well visualized. There is normal pulmonary artery systolic pressure. 3. Left atrial size was moderately dilated. 4. Right atrial size was moderately dilated. 5. The mitral valve is degenerative. Trivial mitral valve regurgitation. 6. The aortic valve was not well visualized. Aortic valve regurgitation is mild. 7. There is mild dilatation of the aortic root, measuring 40 mm.  FINDINGS Left Ventricle: LVEF challenging with  atrial flutter. Left ventricular ejection fraction, by estimation, is 40 to 45%. The left ventricle has mildly decreased function. The left ventricle demonstrates global hypokinesis. The left ventricular internal cavity size was normal in size. There is borderline asymmetric left ventricular hypertrophy of the basal-septal segment. Left ventricular diastolic parameters are consistent with Grade I diastolic dysfunction (impaired relaxation).  Right Ventricle: The right ventricular size is not well visualized. Right ventricular systolic function was not well visualized. There is normal pulmonary artery systolic pressure. The tricuspid regurgitant velocity is 2.02 m/s, and with an assumed  right atrial pressure of 3 mmHg, the estimated right ventricular systolic pressure is 19.3 mmHg.  Left Atrium: Left atrial size was moderately dilated.  Right Atrium: Right atrial size was moderately dilated.  Pericardium: There is no evidence of pericardial effusion.  Mitral Valve: The mitral valve is degenerative in appearance. Mild to moderate mitral annular calcification. Trivial mitral valve regurgitation.  Tricuspid Valve: Tricuspid valve regurgitation is trivial.  Aortic Valve: The aortic valve was not well visualized. Aortic valve regurgitation is mild.  Pulmonic Valve: Pulmonic valve regurgitation is not visualized.  Aorta: The aortic root and ascending aorta are structurally normal, with no evidence of dilitation. There is mild dilatation of the aortic root, measuring 40 mm.  IAS/Shunts: The interatrial septum appears to be lipomatous. The interatrial septum was not well visualized.   LEFT VENTRICLE PLAX 2D LVIDd:         5.10 cm LVIDs:         3.90 cm LV PW:         0.90 cm LV IVS:        0.90 cm LVOT diam:     2.10 cm LV SV:         49 LV SV Index:   30 LVOT Area:     3.46 cm   RIGHT VENTRICLE         IVC TAPSE (M-mode): 1.4 cm  IVC diam: 1.20 cm  LEFT ATRIUM             Index LA diam:        3.40 cm 2.12 cm/m LA Vol (A2C):   28.8 ml 17.96 ml/m LA Vol (A4C):   57.6 ml 35.93 ml/m LA Biplane Vol: 42.1 ml 26.26 ml/m AORTIC VALVE LVOT Vmax:   85.33 cm/s LVOT Vmean:  56.333 cm/s LVOT VTI:    0.141 m  AORTA Ao Root diam: 4.00 cm Ao Asc diam:  3.50 cm  MITRAL VALVE                TRICUSPID VALVE MV Area (PHT): 3.99 cm     TR Peak grad:   16.3 mmHg MV Decel Time: 190 msec     TR Vmax:        202.00 cm/s MV E velocity: 49.00 cm/s MV A velocity: 109.00 cm/s  SHUNTS MV E/A ratio:  0.45         Systemic VTI:  0.14 m Systemic Diam: 2.10 cm  Carolan Clines Electronically signed by Carolan Clines Signature Date/Time: 01/17/2023/12:52:38 PM    Final              Risk Assessment/Calculations:             Physical Exam:   VS:  BP 118/76 (BP Location: Left Arm, Patient Position: Sitting, Cuff Size: Normal)   Pulse 94   Ht 5\' 6"  (1.676 m)   Wt 122 lb (55.3 kg)   BMI 19.69  kg/m    Wt Readings from Last 3 Encounters:  02/08/23 122 lb (55.3 kg)  02/04/23 118 lb 4.8 oz (53.7 kg)  02/01/23 114 lb 9.6 oz (52 kg)    GEN: Well nourished, well developed in no acute distress NECK: No JVD; No carotid bruits CARDIAC: irregular, no murmurs, rubs, gallops RESPIRATORY:  Clear to auscultation without rales, wheezing or rhonchi  ABDOMEN: Soft, non-tender, non-distended EXTREMITIES:  No edema; No deformity   ASSESSMENT AND PLAN: .    HFmrEF - Euvolemic and well compensated on exam. TTE during 01/2023 admission LVEF 40 to 45% though poor acoustic windows made for challenging imaging with more likely late LVEF 50 to 55%.  Continue spironolactone 12 5 mg daily, losartan 25 mg daily, carvedilol 3.125 mg twice daily.  May utilize Lasix 20 mg as needed. Avoid SGLT2i due to age, risk of UTI.  HTN - BP well controlled. Continue current antihypertensive regimen.    Venous insufficiency - Leg elevation, compression socks encouraged.   Hypokalemia - Recommend continuing Potassium daily. Will likely need last potassium supplement then previous is now on spironolactone.  PAC / Atrial arrhythmia -EKG today sinus rhythm with PACs, atrial arrhythmia.  Similar to while admitted.  There was no evidence of atrial fibrillation.  No indication for OAC.  HLD - 01/2023 LDL 120 which was not fasting.  Atorvastatin 80 mg initiated by hospitalist team.  Given her age, relative lack of risk factors we will reduce to 20 mg daily.  No known aortic atherosclerosis nor diabetes.       Dispo: follow up in 6 months  Signed, Alver Sorrow, NP

## 2023-02-08 NOTE — Patient Instructions (Addendum)
Medication Instructions:   REDUCE Atorvastatin to 20mg  daily for simplification of medication regimen  Ensure taking only Potassium daily  Refills of Atorvastatin, Coreg, Lasix, Losartan, potassium, Spironolactone sent to [facility].   *If you need a refill on your cardiac medications before your next appointment, please call your pharmacy*  Testing/Procedures: EKG today shows sinus rhythm with occasional PAC (early beat) which is not of concern.    Follow-Up: At [facility], you and your health needs are our priority.  As part of our continuing mission to provide you with exceptional heart care, we have created designated Provider Care Teams.  These Care Teams include your primary Cardiologist (physician) and Advanced Practice Providers (APPs -  Physician Assistants and Nurse Practitioners) who all work together to provide you with the care you need, when you need it.  We recommend signing up for the patient portal called "MyChart".  Sign up information is provided on this After Visit Summary.  MyChart is used to connect with patients for Virtual Visits (Telemedicine).  Patients are able to view lab/test results, encounter notes, upcoming appointments, etc.  Non-urgent messages can be sent to your provider as well.   To learn more about what you can do with MyChart, go to [email or web address].    Your next appointment:   6 month(s)  Provider:   [name], MD    Other Instructions  To prevent or reduce lower extremity swelling: Eat a low salt diet. Salt makes the body hold onto extra fluid which causes swelling. Sit with legs elevated. For example, in the recliner or on an ottoman.  Wear knee-high compression stockings during the daytime. Ones labeled 15-20 mmHg provide good compression.

## 2023-02-23 DIAGNOSIS — L120 Bullous pemphigoid: Secondary | ICD-10-CM | POA: Diagnosis not present

## 2023-02-23 DIAGNOSIS — I878 Other specified disorders of veins: Secondary | ICD-10-CM | POA: Diagnosis not present

## 2023-02-23 DIAGNOSIS — D692 Other nonthrombocytopenic purpura: Secondary | ICD-10-CM | POA: Diagnosis not present

## 2023-02-28 ENCOUNTER — Encounter: Payer: Self-pay | Admitting: Nurse Practitioner

## 2023-02-28 ENCOUNTER — Non-Acute Institutional Stay (SKILLED_NURSING_FACILITY): Payer: Self-pay | Admitting: Nurse Practitioner

## 2023-02-28 DIAGNOSIS — E876 Hypokalemia: Secondary | ICD-10-CM

## 2023-02-28 DIAGNOSIS — M159 Polyosteoarthritis, unspecified: Secondary | ICD-10-CM

## 2023-02-28 DIAGNOSIS — D649 Anemia, unspecified: Secondary | ICD-10-CM | POA: Diagnosis not present

## 2023-02-28 DIAGNOSIS — L129 Pemphigoid, unspecified: Secondary | ICD-10-CM

## 2023-02-28 DIAGNOSIS — N184 Chronic kidney disease, stage 4 (severe): Secondary | ICD-10-CM | POA: Diagnosis not present

## 2023-02-28 DIAGNOSIS — N3942 Incontinence without sensory awareness: Secondary | ICD-10-CM | POA: Diagnosis not present

## 2023-02-28 DIAGNOSIS — I129 Hypertensive chronic kidney disease with stage 1 through stage 4 chronic kidney disease, or unspecified chronic kidney disease: Secondary | ICD-10-CM | POA: Diagnosis not present

## 2023-02-28 DIAGNOSIS — I498 Other specified cardiac arrhythmias: Secondary | ICD-10-CM

## 2023-02-28 DIAGNOSIS — N1832 Chronic kidney disease, stage 3b: Secondary | ICD-10-CM | POA: Diagnosis not present

## 2023-02-28 DIAGNOSIS — I10 Essential (primary) hypertension: Secondary | ICD-10-CM

## 2023-02-28 DIAGNOSIS — M8000XD Age-related osteoporosis with current pathological fracture, unspecified site, subsequent encounter for fracture with routine healing: Secondary | ICD-10-CM | POA: Diagnosis not present

## 2023-02-28 DIAGNOSIS — I509 Heart failure, unspecified: Secondary | ICD-10-CM | POA: Diagnosis not present

## 2023-02-28 DIAGNOSIS — D631 Anemia in chronic kidney disease: Secondary | ICD-10-CM | POA: Diagnosis not present

## 2023-02-28 DIAGNOSIS — R809 Proteinuria, unspecified: Secondary | ICD-10-CM | POA: Diagnosis not present

## 2023-02-28 NOTE — Assessment & Plan Note (Addendum)
blood pressure is controlled, on Losartan, Carvedilol, Spironolactone, Furosemide.

## 2023-02-28 NOTE — Assessment & Plan Note (Signed)
Bullous pemphigoid BLE, followed by Dermatology, on Prednisone. 02/23/23 dermatology Doxy 100mg  bid x 30 days, 0.05% bid prn x14 days.

## 2023-02-28 NOTE — Assessment & Plan Note (Signed)
Her urinary frequency/leakage, uses pads, it seems better since taking Myrbetriq in pm per Urology ?

## 2023-02-28 NOTE — Assessment & Plan Note (Signed)
new onset Afib, cardiology: no evidence of Afib, probably wandering atrial pacer.

## 2023-02-28 NOTE — Assessment & Plan Note (Addendum)
CHF- BNP 265, EF 40-45%, on Carvedilol, Losartan, Aldactone, Furosemide and off Metolazone, Amlodipine

## 2023-02-28 NOTE — Progress Notes (Unsigned)
Location:   SNF [facility] Room Number: 2B Place of Service:  SNF (31) Provider: Arna Snipe Kashlynn Kundert NP  Jazman Reuter X, NP  Patient Care Team: Lawson Isabell X, NP as PCP - General (Internal Medicine) [name], MD as PCP - Cardiology (Cardiology)  Extended Emergency Contact Information Primary Emergency Contact: Venneman,[name] Address: [address]          [address], [place] [address] [name] of [place] Home Phone: [phone] Work Phone: [phone] Mobile Phone: [phone] Relation: Daughter Secondary Emergency Contact: [name],[name] Home Phone: [phone] Relation: Daughter  Code Status:  DNR Goals of care: Advanced Directive information    02/01/2023    2:48 PM  Advanced Directives  Does Patient Have a Medical Advance Directive? Yes  Type of Estate agent of [place];Out of facility DNR (pink MOST or yellow form)  Does patient want to make changes to medical advance directive? No - Patient declined  Copy of Healthcare Power of Attorney in Chart? Yes - validated most recent copy scanned in chart (See row information)  Pre-existing out of facility DNR order (yellow form or pink MOST form) Pink MOST form placed in chart (order not valid for inpatient use);Yellow form placed in chart (order not valid for inpatient use)     Chief Complaint  Patient presents with  . Medical Management of Chronic Issues    HPI:  Pt is a 89 y.o. female seen today for medical management of chronic diseases.       Bullous pemphigoid BLE, followed by Dermatology, on Prednisone. 02/23/23 dermatology Doxy 100mg  bid x 30 days, 0.05% bid prn x14 days.    Hospitalized 01/16/23-01/19/23 s/p fall, left sided rib pain, CT head/cervical spine showed no acute intracranial process, negative Xray hips. She was found to be in new onset Afib, cardiology: no evidence of Afib, probably wandering atrial pacer,  CHF- BNP 265, EF 40-45%, started on Carvedilol, Losartan,  Aldactone, resumed Furosemide and off  Metolazone, Amlodipine.              Emotional outburst, irritable             Hypokalemia, K 4.6 02/10/23 Pemphigoid, legs, responded to steroid tx, 3 previous healed bullous pemphigoid areas re opened, serous drainage noted Anemia, Vit B12 2430, Folate >40 01/16/23,  EPO 9.9 09/15/21, On Fe, Hgb 8.8 02/10/23,  chronic, baseline Hgb 9s,  on Pantoprazole for GI protection, FOBT negative CHF/Edema BLE, chronic, minimal on Aldactoneand Furosemide now, 07/2021 venous US LLE negative DVT, prn Furosemide available. Followed by Cardiology.  CKD Bun/creat 20/1.45 02/10/23  saw nephrology OA, Hx of R ankle fx, R hip total hip arthroplasty 11/30/21, ambulates with walker, uses w/c for mobility since last hospitalization.              Her urinary frequency/leakage, uses pads, it seems better since taking Myrbetriq in pm per Urology             HTN, blood pressure is controlled, off Amlodipine started 07/07/21 by Nephrology, now taking Losartan, Carvedilol, Furosemide, Spironolactone.              No constipation, taking Colace bid, prn MiraLax              Osteoporosis, off Alendronate due to CKD, 11/05/20 DEXA t score -2.8, desires delaying DEXA             Fecal incontinence prn Imodium       Past Medical History:  Diagnosis Date  .  Anemia   . Chronic kidney disease    stage 3  . GERD (gastroesophageal reflux disease)   . Hypertension    Past Surgical History:  Procedure Laterality Date  . ORIF ANKLE FRACTURE Right 01/13/2021   Procedure: OPEN TREATMENT OF RIGHT TRIMALLEOLAR ANKLE FRACTURE WITHOUT POSTERIOR FIXATION, POSSIBLE SYNDESMOSIS;  Surgeon: Terance Hart, MD;  Location: [facility] OR;  Service: Orthopedics;  Laterality: Right;  . TOTAL HIP ARTHROPLASTY Right 12/02/2021   Procedure: TOTAL HIP ARTHROPLASTY;  Surgeon: Joen Laura, MD;  Location: [facility] ORS;  Service: Orthopedics;  Laterality: Right;    Allergies  Allergen Reactions  . Penicillins Swelling  and Other (See Comments)    Facial swelling; can tolerate cephalosporins   . Ace Inhibitors Other (See Comments)    Reaction unknown  . Flagyl [Metronidazole] Other (See Comments)    Reaction unknown    Allergies as of 02/28/2023       Reactions   Penicillins Swelling, Other (See Comments)   Facial swelling; can tolerate cephalosporins   Ace Inhibitors Other (See Comments)   Reaction unknown   Flagyl [metronidazole] Other (See Comments)   Reaction unknown        Medication List        Accurate as of February 28, 2023 11:59 PM. If you have any questions, ask your nurse or doctor.          acetaminophen 325 MG tablet Commonly known as: TYLENOL Take 2 tablets (650 mg total) by mouth every 6 (six) hours as needed for mild pain, fever or headache.   aspirin EC 81 MG tablet Take 81 mg by mouth daily. Monday, Wednesday and Friday   atorvastatin 20 MG tablet Commonly known as: LIPITOR Take 4 tablets (80 mg total) by mouth daily.   bacitracin 500 UNIT/GM ointment Apply 1 Application topically daily. Apply to Lower leg wounds topically one time a day for healed bullous pemphigoid areas   carvedilol 3.125 MG tablet Commonly known as: COREG Take 1 tablet (3.125 mg total) by mouth 2 (two) times daily with a meal.   cholecalciferol 25 MCG (1000 UNIT) tablet Commonly known as: VITAMIN D3 Take 1,000 Units by mouth every Monday, Wednesday, and Friday.   FeroSul 325 (65 Fe) MG tablet Generic drug: ferrous sulfate Take 1 tablet (325 mg total) by mouth daily with breakfast. MON, WED, FRI   furosemide 20 MG tablet Commonly known as: LASIX Take 1 tablet (20 mg total) by mouth daily as needed for edema or fluid.   guaiFENesin-dextromethorphan 100-10 MG/5ML syrup Commonly known as: ROBITUSSIN DM Take 10 mLs by mouth every 6 (six) hours as needed for cough.   ICAPS AREDS 2 PO Take 1 capsule by mouth daily with breakfast.   losartan 25 MG tablet Commonly known as:  COZAAR Take 1 tablet (25 mg total) by mouth daily.   melatonin 3 MG Tabs tablet Take 1 tablet (3 mg total) by mouth at bedtime.   Myrbetriq 25 MG Tb24 tablet Generic drug: mirabegron ER TAKE 2 TABLETS BY MOUTH ONCE  DAILY   pantoprazole 40 MG tablet Commonly known as: PROTONIX Take 1 tablet (40 mg total) by mouth daily.   potassium chloride SA 20 MEQ tablet Commonly known as: KLOR-CON M Take 1 tablet (20 mEq total) by mouth daily.   Sarna lotion Generic drug: camphor-menthol Apply 1 Application topically in the morning and at bedtime.   senna-docusate 8.6-50 MG tablet Commonly known as: Senokot-S Take 1 tablet by mouth at bedtime as  needed for mild constipation.   spironolactone 25 MG tablet Commonly known as: ALDACTONE Take 0.5 tablets (12.5 mg total) by mouth daily.   vitamin C 250 MG tablet Commonly known as: ASCORBIC ACID Take 1 tablet (250 mg total) by mouth daily.        Review of Systems  Constitutional:  Negative for appetite change, fatigue and fever.  HENT:  Positive for hearing loss. Negative for congestion and sore throat.   Eyes:  Negative for visual disturbance.  Respiratory:  Positive for cough. Negative for shortness of breath.        Occasional cough, scant greenish phlegm production.   Cardiovascular:  Positive for leg swelling.  Gastrointestinal:  Negative for abdominal pain and constipation.       Fecal incontinent.   Genitourinary:  Positive for frequency. Negative for dysuria and urgency.       Occasionally incontinent of urine. Urination average every 4 hrs during day 1x/night.   Musculoskeletal:  Positive for arthralgias and gait problem.       Right lower leg/ankle brace  Skin:  Positive for wound.  Neurological:  Negative for speech difficulty, weakness and light-headedness.       Memory lapses.   Psychiatric/Behavioral:  Negative for behavioral problems and sleep disturbance. The patient is not nervous/anxious.     Immunization  History  Administered Date(s) Administered  . Fluad Quad(high Dose 65+) 03/28/2019  . H1N1 03/18/2009  . Influenza, High Dose Seasonal PF 03/25/2017, 03/16/2018, 03/14/2021, 03/14/2022  . Influenza,inj,quad, With Preservative 03/26/2020, 03/30/2021  . Influenza-Unspecified 02/25/2011, 03/31/2012, 03/27/2013, 03/14/2014, 03/25/2016, 06/14/2017, 03/28/2019  . Moderna SARS-COV2 Booster Vaccination 04/22/2020, 10/28/2020  . Moderna Sars-Covid-2 Vaccination 06/18/2019, 07/16/2019  . PFIZER(Purple Top)SARS-COV-2 Vaccination 03/03/2021  . PPD Test 12/25/2020, 01/05/2021, 12/17/2021  . Research officer, trade [employer] 59yrs & up 03/03/2021  . Pneumococcal Conjugate-13 09/10/2014  . Pneumococcal Polysaccharide-23 11/03/2014  . Pneumococcal-Unspecified 06/14/2017  . Tdap 11/27/2019  . Unspecified SARS-COV-2 Vaccination 04/22/2020, 10/28/2020  . Zoster Recombinant(Shingrix) 08/27/2022, 08/27/2022   Pertinent  Health Maintenance Due  Topic Date Due  . INFLUENZA VACCINE  01/13/2023  . DEXA SCAN  Completed      06/17/2022    1:45 PM 07/01/2022   11:07 AM 09/08/2022    2:58 PM 09/23/2022    3:00 PM 11/05/2022    1:51 PM  Fall Risk  Falls in the past year? 0 0 0 0 0  Was there an injury with Fall? 0 0 0 0 0  Fall Risk Category Calculator 0 0 0 0 0  Fall Risk Category (Retired) Low      (RETIRED) Patient Fall Risk Level Low fall risk      Patient at Risk for Falls Due to No Fall Risks No Fall Risks No Fall Risks No Fall Risks No Fall Risks  Fall risk Follow up Falls evaluation completed Falls evaluation completed Falls evaluation completed Falls evaluation completed Falls evaluation completed   Functional Status Survey:    Vitals:   02/28/23 1021 03/01/23 1155  BP: (!) 143/85 121/69  Pulse: 89   Resp: 18   Temp: 97.7 F (36.5 C)   SpO2: 92%   Weight: 123 lb 1.6 oz (55.8 kg)    Body mass index is 19.87 kg/m. Physical Exam Vitals reviewed.  Constitutional:      Appearance:  Normal appearance.  HENT:     Head: Normocephalic and atraumatic.     Nose: Nose normal.     Mouth/Throat:  Mouth: Mucous membranes are moist.  Eyes:     Extraocular Movements: Extraocular movements intact.     Conjunctiva/sclera: Conjunctivae normal.     Pupils: Pupils are equal, round, and reactive to light.  Cardiovascular:     Rate and Rhythm: Normal rate and regular rhythm.     Heart sounds: No murmur heard. Pulmonary:     Effort: Pulmonary effort is normal.     Breath sounds: Rales present. No wheezing or rhonchi.     Comments: Bibasilar rales.  Abdominal:     General: Bowel sounds are normal.     Palpations: Abdomen is soft.     Tenderness: There is no abdominal tenderness.  Musculoskeletal:        General: No tenderness.     Cervical back: Normal range of motion and neck supple.     Right lower leg: Edema present.     Left lower leg: Edema present.     Comments: R ankle s/p ORIF 01/13/21. THR 11/2021. Edema 1+ BLE  Skin:    General: Skin is warm and dry.     Comments: Chronic pigmented venous insufficiency skin changes BLE. Opened bullae x RLE, x1 LLE, no s/s of infection presently.   Neurological:     Mental Status: She is alert. Mental status is at baseline.     Gait: Gait abnormal.     Comments: Oriented to person, place.   Psychiatric:        Mood and Affect: Mood normal.        Behavior: Behavior normal.        Thought Content: Thought content normal.    Labs reviewed: Recent Labs    01/16/23 2032 01/17/23 0403 01/17/23 1645 01/18/23 0419 01/19/23 0933 01/22/23 0000 01/25/23 0000  NA 130*   < > 134* 133* 129* 129* 132*  K <2.0*   < > 2.9* 4.1 2.9* 4.2 4.9  CL 87*   < > 95* 93* 92* 97* 99  CO2 30   < > 28 29 24 20  26*  GLUCOSE 106*   < > 106* 111* 125*  --   --   BUN 36*   < > 35* 33* 30* 29*  --   CREATININE 1.95*   < > 1.80* 2.00* 1.76* 1.5* 1.6*  CALCIUM 9.1   < > 8.5* 9.0 8.9 9.1 9.2  MG 1.9  --   --  1.7  --   --   --   PHOS  --   --   --   2.3*  --   --   --    < > = values in this interval not displayed.   Recent Labs    01/16/23 1125 01/17/23 0403 01/25/23 0000  AST 30 31 16   ALT 13 17 13   ALKPHOS 65 54 73  BILITOT 0.5 0.6  --   PROT 6.7 5.5*  --   ALBUMIN 4.0 3.0* 3.4*   Recent Labs    01/16/23 1125 01/17/23 0403 01/18/23 0419 01/25/23 0000  WBC 8.0 6.5 7.3 9.1  NEUTROABS 4.9  --   --  6,270.00  HGB 10.9* 9.8* 9.9* 9.5*  HCT 31.8* 30.2* 30.4* 28*  MCV 93.8 98.7 98.4  --   PLT 385 318 318 268   Lab Results  Component Value Date   TSH 0.862 01/16/2023   No results found for: "HGBA1C" Lab Results  Component Value Date   CHOL 174 01/16/2023   HDL 40 (L) 01/16/2023  LDLCALC 120 (H) 01/16/2023   TRIG 69 01/16/2023   CHOLHDL 4.4 01/16/2023    Significant Diagnostic Results in last 30 days:  No results found.  Assessment/Plan  Pemphigoid  Bullous pemphigoid BLE, followed by Dermatology, on Prednisone. 02/23/23 dermatology Doxy 100mg  bid x 30 days, 0.05% bid prn x14 days.   Atrial arrhythmia new onset Afib, cardiology: no evidence of Afib, probably wandering atrial pacer.  Congestive heart failure (CHF) (HCC)  CHF- BNP 265, EF 40-45%, on Carvedilol, Losartan, Aldactone, Furosemide and off Metolazone, Amlodipine  Hypokalemia  K 4.6 02/10/23  Anemia  Vit B12 2430, Folate >40 01/16/23,  EPO 9.9 09/15/21, On Fe, Hgb 8.8 02/10/23, chronic, baseline Hgb 9s,  on Pantoprazole for GI protection, FOBT negative  CKD (chronic kidney disease) stage 4, GFR 15-29 ml/min (HCC) Bun/creat 20/1.45 02/10/23,  saw nephrology  Osteoarthritis, multiple sites OA, Hx of R ankle fx, R hip total hip arthroplasty 11/30/21, ambulates with walker, uses w/c for mobility since last hospitalization.   Incontinent of urine Her urinary frequency/leakage, uses pads, it seems better since taking Myrbetriq in pm per Urology  Hypertension  blood pressure is controlled, on Losartan, Carvedilol, Spironolactone, Furosemide.    Osteoporosis off Alendronate due to CKD, 11/05/20 DEXA t score -2.8, desires delaying DEXA   Family/ staff Communication: plan of care reviewed with the patient and charge nurse.   Labs/tests ordered:  none  Time spend 35 minutes.

## 2023-02-28 NOTE — Assessment & Plan Note (Signed)
off Alendronate due to CKD, 11/05/20 DEXA t score -2.8, desires delaying DEXA

## 2023-02-28 NOTE — Assessment & Plan Note (Addendum)
Vit B12 2430, Folate >40 01/16/23,  EPO 9.9 09/15/21, On Fe, Hgb 8.8 02/10/23, chronic, baseline Hgb 9s,  on Pantoprazole for GI protection, FOBT negative

## 2023-02-28 NOTE — Assessment & Plan Note (Addendum)
K 4.6 02/10/23

## 2023-02-28 NOTE — Assessment & Plan Note (Signed)
OA, Hx of R ankle fx, R hip total hip arthroplasty 11/30/21, ambulates with walker, uses w/c for mobility since last hospitalization.

## 2023-02-28 NOTE — Assessment & Plan Note (Signed)
Bun/creat 20/1.45 02/10/23,  saw nephrology

## 2023-03-02 ENCOUNTER — Encounter: Payer: Self-pay | Admitting: Nurse Practitioner

## 2023-03-02 ENCOUNTER — Non-Acute Institutional Stay (SKILLED_NURSING_FACILITY): Payer: Medicare Other | Admitting: Nurse Practitioner

## 2023-03-02 DIAGNOSIS — K5901 Slow transit constipation: Secondary | ICD-10-CM

## 2023-03-02 DIAGNOSIS — I509 Heart failure, unspecified: Secondary | ICD-10-CM | POA: Diagnosis not present

## 2023-03-02 DIAGNOSIS — N3942 Incontinence without sensory awareness: Secondary | ICD-10-CM

## 2023-03-02 DIAGNOSIS — M8000XD Age-related osteoporosis with current pathological fracture, unspecified site, subsequent encounter for fracture with routine healing: Secondary | ICD-10-CM | POA: Diagnosis not present

## 2023-03-02 DIAGNOSIS — L129 Pemphigoid, unspecified: Secondary | ICD-10-CM | POA: Diagnosis not present

## 2023-03-02 DIAGNOSIS — M150 Primary generalized (osteo)arthritis: Secondary | ICD-10-CM

## 2023-03-02 DIAGNOSIS — I498 Other specified cardiac arrhythmias: Secondary | ICD-10-CM

## 2023-03-02 DIAGNOSIS — M159 Polyosteoarthritis, unspecified: Secondary | ICD-10-CM

## 2023-03-02 DIAGNOSIS — I10 Essential (primary) hypertension: Secondary | ICD-10-CM | POA: Diagnosis not present

## 2023-03-02 DIAGNOSIS — D649 Anemia, unspecified: Secondary | ICD-10-CM

## 2023-03-02 DIAGNOSIS — N184 Chronic kidney disease, stage 4 (severe): Secondary | ICD-10-CM | POA: Diagnosis not present

## 2023-03-02 NOTE — Assessment & Plan Note (Signed)
Vit B12 2430, Folate >40 01/16/23,  EPO 9.9 09/15/21, On Fe, Hgb 8.8 02/10/23,  chronic, baseline Hgb 9s,  on Pantoprazole for GI protection, FOBT negative

## 2023-03-02 NOTE — Assessment & Plan Note (Signed)
Hx of R ankle fx, R hip total hip arthroplasty 11/30/21, ambulates with walker, uses w/c for mobility since last hospitalization.

## 2023-03-02 NOTE — Assessment & Plan Note (Signed)
Bun/creat 20/1.45 02/10/23  saw nephrology, 03/01/23  [facility]: increase Lasix to 40mg  every day, BMP one week.

## 2023-03-02 NOTE — Assessment & Plan Note (Signed)
off Alendronate due to CKD, 11/05/20 DEXA t score -2.8, desires delaying DEXA

## 2023-03-02 NOTE — Progress Notes (Signed)
Location:   [facility] Room Number: 2 B Place of Service:  SNF (31)  Provider: Chipper Oman NP  PCP: Uriel Dowding X, NP Patient Care Team: Kinzi Frediani X, NP as PCP - General (Internal Medicine) [name], MD as PCP - Cardiology (Cardiology)  Extended Emergency Contact Information Primary Emergency Contact: Lincoln,[name] Address: [address]          [place], [place] [address] [name] of [place] Home Phone: [phone] Work Phone: [phone] Mobile Phone: [phone] Relation: Daughter Secondary Emergency Contact: [name],[name] Home Phone: [phone] Relation: Daughter  Code Status: DNR Goals of care:  Advanced Directive information    02/01/2023    2:48 PM  Advanced Directives  Does Patient Have a Medical Advance Directive? Yes  Type of Estate agent of [place];Out of facility DNR (pink MOST or yellow form)  Does patient want to make changes to medical advance directive? No - Patient declined  Copy of Healthcare Power of Attorney in Chart? Yes - validated most recent copy scanned in chart (See row information)  Pre-existing out of facility DNR order (yellow form or pink MOST form) Pink MOST form placed in chart (order not valid for inpatient use);Yellow form placed in chart (order not valid for inpatient use)     Allergies  Allergen Reactions  . Penicillins Swelling and Other (See Comments)    Facial swelling; can tolerate cephalosporins   . Ace Inhibitors Other (See Comments)    Reaction unknown  . Flagyl [Metronidazole] Other (See Comments)    Reaction unknown    Chief Complaint  Patient presents with  . Acute Visit    Discharge to [facility]    HPI:  89 y.o. female with medical history significant of Bullous pemphigoid, Atrial pacer, CHF, hypokalemia, anemia, CKD, OA, urinary frequency, and constipation was admitted to [facility] for recovering and therapy following hospital stay 01/16/23-01/19/23. The  patient has regained physical strength, ADL function, medically she is stable to transfer to [facility] for continue therapy and care.    Bullous pemphigoid BLE, followed by Dermatology, on Prednisone. 02/23/23 dermatology Doxy 100mg  bid x 30 days, 0.05% bid prn x14 days.               Hospitalized 01/16/23-01/19/23 s/p fall, left sided rib pain, CT head/cervical spine showed no acute intracranial process, negative Xray hips. 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              Emotional outburst, irritable             Hypokalemia, K 4.6 02/10/23 Pemphigoid, legs, responded to steroid tx, 2 opened area RLE, slightly erythema RLE,  1 open area LLE,  serous drainage noted Anemia, Vit B12 2430, Folate >40 01/16/23,  EPO 9.9 09/15/21, On Fe, Hgb 8.8 02/10/23,  chronic, baseline Hgb 9s,  on Pantoprazole for GI protection, FOBT negative CHF/Edema BLE, chronic, minimal on Aldactoneand Furosemide(increased to 40mg  03/01/23 per [facility]) now, 07/2021 venous US LLE negative DVT, prn Furosemide available. Followed by Cardiology.  CKD Bun/creat 20/1.45 02/10/23  saw nephrology, 03/01/23 [facility]: increase Lasix to 40mg  every day, BMP one week.  OA, Hx of R ankle fx, R hip total hip arthroplasty 11/30/21, ambulates with walker, uses w/c for mobility since last hospitalization.  Her urinary frequency/leakage, uses pads, it seems better since taking Myrbetriq in pm per Urology             HTN, blood pressure is controlled, off Amlodipine started 07/07/21 by Nephrology, now taking Losartan, Carvedilol, Furosemide, Spironolactone.              No constipation, taking Colace bid, prn MiraLax             Osteoporosis, off Alendronate due to CKD, 11/05/20 DEXA t score -2.8, desires delaying DEXA             Fecal incontinence prn  Imodium   Past Medical History:  Diagnosis Date  . Anemia   . Chronic kidney disease    stage 3  . GERD (gastroesophageal reflux disease)   . Hypertension     Past Surgical History:  Procedure Laterality Date  . ORIF ANKLE FRACTURE Right 01/13/2021   Procedure: OPEN TREATMENT OF RIGHT TRIMALLEOLAR ANKLE FRACTURE WITHOUT POSTERIOR FIXATION, POSSIBLE SYNDESMOSIS;  Surgeon: Terance Hart, MD;  Location: [facility];  Service: Orthopedics;  Laterality: Right;  . TOTAL HIP ARTHROPLASTY Right 12/02/2021   Procedure: TOTAL HIP ARTHROPLASTY;  Surgeon: Joen Laura, MD;  Location: [facility] ORS;  Service: Orthopedics;  Laterality: Right;      reports that she quit smoking about 65 years ago. Her smoking use included cigarettes. She started smoking about 77 years ago. She has never used smokeless tobacco. She reports that she does not currently use alcohol. She reports that she does not use drugs. Social History   Socioeconomic History  . Marital status: Widowed    Spouse name: Adela Lank  . Number of children: 2  . Years of education: Not on file  . Highest education level: Not on file  Occupational History  . Occupation: Runner, broadcasting/film/video    Comment: retired  Tobacco Use  . Smoking status: Former    Current packs/day: 0.00    Types: Cigarettes    Start date: 06/14/1945    Quit date: 06/14/1957    Years since quitting: 65.7  . Smokeless tobacco: Never  Vaping Use  . Vaping status: Never Used  Substance and Sexual Activity  . Alcohol use: Not Currently  . Drug use: No  . Sexual activity: Not on file  Other Topics Concern  . Not on file  Social History Narrative   Social History     Socioeconomic History       Marital status: Married   09/01/1954       Spouse name: Adela Lank   Two people stay in the home on the fourth floor       Number of children: 2       Years of education:       Highest education level: Not on file    Exercise: yoga, walking     Social Needs       Financial resource strain:  Not on file       Food insecurity - worry: Not on file       Food insecurity - inability: Not on file       Transportation needs - medical: Not on file       Transportation needs - non-medical: Not on file     Occupational History       Not on file     Tobacco Use       Smoking status: Never Smoker       Smokeless tobacco: Never Used  Substance and Sexual Activity       Alcohol use: No       Drug use: No       Sexual activity: Not on file     Other Topics       Concerns:         Not on file     Social History Narrative       Not on file   Social Determinants of Health   Financial Resource Strain: Low Risk  (10/20/2017)   Overall Financial Resource Strain (CARDIA)   . Difficulty of Paying Living Expenses: Not hard at all  Food Insecurity: No Food Insecurity (01/16/2023)   Hunger Vital Sign   . Worried About Programme researcher, broadcasting/film/video in the Last Year: Never true   . Ran Out of Food in the Last Year: Never true  Transportation Needs: No Transportation Needs (01/16/2023)   PRAPARE - Transportation   . Lack of Transportation (Medical): No   . Lack of Transportation (Non-Medical): No  Physical Activity: Inactive (10/20/2017)   Exercise Vital Sign   . Days of Exercise per Week: 0 days   . Minutes of Exercise per Session: 0 min  Stress: No Stress Concern Present (10/20/2017)   [employer] of Occupational Health - Occupational Stress Questionnaire   . Feeling of Stress : Only a little  Social Connections: Somewhat Isolated (10/20/2017)   Social Connection and Isolation Panel [NHANES]   . Frequency of Communication with Friends and Family: More than three times a week   . Frequency of Social Gatherings with Friends and Family: More than three times a week   . Attends Religious Services: Never   . Active Member of Clubs or Organizations: No   . Attends Banker Meetings: Never   . Marital Status: Married  Catering manager Violence: Not At Risk (01/16/2023)   Humiliation,  Afraid, Rape, and Kick questionnaire   . Fear of Current or Ex-Partner: No   . Emotionally Abused: No   . Physically Abused: No   . Sexually Abused: No   Functional Status Survey:    Allergies  Allergen Reactions  . Penicillins Swelling and Other (See Comments)    Facial swelling; can tolerate cephalosporins   . Ace Inhibitors Other (See Comments)    Reaction unknown  . Flagyl [Metronidazole] Other (See Comments)    Reaction unknown    Pertinent  Health Maintenance Due  Topic Date Due  . INFLUENZA VACCINE  01/13/2023  . DEXA SCAN  Completed    Medications: Allergies as of 03/02/2023       Reactions   Penicillins Swelling, Other (See Comments)   Facial swelling; can tolerate cephalosporins   Ace Inhibitors Other (See Comments)   Reaction unknown   Flagyl [metronidazole] Other (See Comments)   Reaction unknown        Medication List        Accurate as of March 02, 2023  3:49 PM. If you have any questions, ask your nurse or doctor.          acetaminophen 325 MG tablet Commonly known as: TYLENOL Take 2 tablets (650 mg total) by mouth every 6 (six) hours as needed for mild pain, fever or headache.   aspirin EC 81 MG tablet Take 81 mg by mouth daily. Monday, Wednesday and Friday   atorvastatin 20 MG tablet Commonly known as: LIPITOR Take 4 tablets (80 mg total) by mouth daily.   bacitracin 500 UNIT/GM ointment Apply 1  Application topically daily. Apply to Lower leg wounds topically one time a day for healed bullous pemphigoid areas   carvedilol 3.125 MG tablet Commonly known as: COREG Take 1 tablet (3.125 mg total) by mouth 2 (two) times daily with a meal.   cholecalciferol 25 MCG (1000 UNIT) tablet Commonly known as: VITAMIN D3 Take 1,000 Units by mouth every Monday, Wednesday, and Friday.   FeroSul 325 (65 Fe) MG tablet Generic drug: ferrous sulfate Take 1 tablet (325 mg total) by mouth daily with breakfast. MON, WED, FRI   furosemide 20 MG  tablet Commonly known as: LASIX Take 1 tablet (20 mg total) by mouth daily as needed for edema or fluid.   guaiFENesin-dextromethorphan 100-10 MG/5ML syrup Commonly known as: ROBITUSSIN DM Take 10 mLs by mouth every 6 (six) hours as needed for cough.   ICAPS AREDS 2 PO Take 1 capsule by mouth daily with breakfast.   losartan 25 MG tablet Commonly known as: COZAAR Take 1 tablet (25 mg total) by mouth daily.   melatonin 3 MG Tabs tablet Take 1 tablet (3 mg total) by mouth at bedtime.   Myrbetriq 25 MG Tb24 tablet Generic drug: mirabegron ER TAKE 2 TABLETS BY MOUTH ONCE  DAILY   pantoprazole 40 MG tablet Commonly known as: PROTONIX Take 1 tablet (40 mg total) by mouth daily.   potassium chloride SA 20 MEQ tablet Commonly known as: KLOR-CON M Take 1 tablet (20 mEq total) by mouth daily.   Sarna lotion Generic drug: camphor-menthol Apply 1 Application topically in the morning and at bedtime.   senna-docusate 8.6-50 MG tablet Commonly known as: Senokot-S Take 1 tablet by mouth at bedtime as needed for mild constipation.   spironolactone 25 MG tablet Commonly known as: ALDACTONE Take 0.5 tablets (12.5 mg total) by mouth daily.   vitamin C 250 MG tablet Commonly known as: ASCORBIC ACID Take 1 tablet (250 mg total) by mouth daily.        Review of Systems  Constitutional:  Negative for appetite change, fatigue and fever.  HENT:  Positive for hearing loss. Negative for congestion and sore throat.   Eyes:  Negative for visual disturbance.  Respiratory:  Positive for cough. Negative for shortness of breath.        Occasional cough, scant greenish phlegm production.   Cardiovascular:  Positive for leg swelling.  Gastrointestinal:  Negative for abdominal pain and constipation.       Fecal incontinent.   Genitourinary:  Positive for frequency. Negative for dysuria and urgency.       Occasionally incontinent of urine. Urination average every 4 hrs during day 1x/night.    Musculoskeletal:  Positive for arthralgias and gait problem.       Right lower leg/ankle brace  Skin:  Positive for wound.  Neurological:  Negative for speech difficulty, weakness and light-headedness.       Memory lapses.   Psychiatric/Behavioral:  Negative for behavioral problems and sleep disturbance. The patient is not nervous/anxious.     Vitals:   03/02/23 1451  BP: 139/72  Pulse: 84  Resp: 18  Temp: 97.7 F (36.5 C)  SpO2: 95%  Weight: 123 lb 1.6 oz (55.8 kg)   Body mass index is 19.87 kg/m. Physical Exam Vitals reviewed.  Constitutional:      Appearance: Normal appearance.  HENT:     Head: Normocephalic and atraumatic.     Nose: Nose normal.     Mouth/Throat:     Mouth: Mucous membranes are moist.  Eyes:     Extraocular Movements: Extraocular movements intact.     Conjunctiva/sclera: Conjunctivae normal.     Pupils: Pupils are equal, round, and reactive to light.  Cardiovascular:     Rate and Rhythm: Normal rate and regular rhythm.     Heart sounds: No murmur heard. Pulmonary:     Effort: Pulmonary effort is normal.     Breath sounds: Rales present.     Comments: Bibasilar rales.  Abdominal:     General: Bowel sounds are normal.     Palpations: Abdomen is soft.     Tenderness: There is no abdominal tenderness.  Musculoskeletal:        General: No tenderness.     Cervical back: Normal range of motion and neck supple.     Right lower leg: Edema present.     Left lower leg: Edema present.     Comments: R ankle s/p ORIF 01/13/21. THR 11/2021. Edema trace to 1+ LLE, 1-2+ RLE  Skin:    General: Skin is warm and dry.     Findings: Erythema present.     Comments: Chronic pigmented venous insufficiency skin changes BLE. Opened bullae 2 x RLE, x1 LLE, no s/s of infection presently.   Neurological:     Mental Status: She is alert. Mental status is at baseline.     Gait: Gait abnormal.     Comments: Oriented to person, place.   Psychiatric:        Mood and  Affect: Mood normal.        Behavior: Behavior normal.        Thought Content: Thought content normal.    Labs reviewed: Basic Metabolic Panel: Recent Labs    01/16/23 2032 01/17/23 0403 01/17/23 1645 01/18/23 0419 01/19/23 0933 01/22/23 0000 01/25/23 0000  NA 130*   < > 134* 133* 129* 129* 132*  K <2.0*   < > 2.9* 4.1 2.9* 4.2 4.9  CL 87*   < > 95* 93* 92* 97* 99  CO2 30   < > 28 29 24 20  26*  GLUCOSE 106*   < > 106* 111* 125*  --   --   BUN 36*   < > 35* 33* 30* 29*  --   CREATININE 1.95*   < > 1.80* 2.00* 1.76* 1.5* 1.6*  CALCIUM 9.1   < > 8.5* 9.0 8.9 9.1 9.2  MG 1.9  --   --  1.7  --   --   --   PHOS  --   --   --  2.3*  --   --   --    < > = values in this interval not displayed.   Liver Function Tests: Recent Labs    01/16/23 1125 01/17/23 0403 01/25/23 0000  AST 30 31 16   ALT 13 17 13   ALKPHOS 65 54 73  BILITOT 0.5 0.6  --   PROT 6.7 5.5*  --   ALBUMIN 4.0 3.0* 3.4*   No results for input(s): "LIPASE", "AMYLASE" in the last 8760 hours. No results for input(s): "AMMONIA" in the last 8760 hours. CBC: Recent Labs    01/16/23 1125 01/17/23 0403 01/18/23 0419 01/25/23 0000  WBC 8.0 6.5 7.3 9.1  NEUTROABS 4.9  --   --  6,270.00  HGB 10.9* 9.8* 9.9* 9.5*  HCT 31.8* 30.2* 30.4* 28*  MCV 93.8 98.7 98.4  --   PLT 385 318 318 268   Cardiac Enzymes: No results for input(s): "CKTOTAL", "  CKMB", "CKMBINDEX", "TROPONINI" in the last 8760 hours. BNP: Invalid input(s): "POCBNP" CBG: No results for input(s): "GLUCAP" in the last 8760 hours.  Procedures and Imaging Studies During Stay: No results found.  Assessment/Plan:   Pemphigoid Bullous pemphigoid BLE, followed by Dermatology, on Prednisone. 02/23/23 dermatology Doxy 100mg  bid x 30 days, 0.05% bid prn x14 days.   legs, responded to steroid tx, 2 opened area RLE, slightly erythema RLE,  1 open area LLE,  serous drainage noted  Atrial arrhythmia cardiology: no evidence of Afib, probably wandering atrial  pacer  Congestive heart failure (CHF) (HCC) BNP 265, EF 40-45%, started on Carvedilol, Losartan, Aldactone, resumed Furosemide and off  Metolazone, Amlodipine. Edema BLE, chronic, minimal on Aldactoneand Furosemide(increased to 40mg  03/01/23 per [facility]) now, 07/2021 venous US LLE negative DVT, prn Furosemide available. Followed by Cardiology.   Anemia Vit B12 2430, Folate >40 01/16/23,  EPO 9.9 09/15/21, On Fe, Hgb 8.8 02/10/23,  chronic, baseline Hgb 9s,  on Pantoprazole for GI protection, FOBT negative  CKD (chronic kidney disease) stage 4, GFR 15-29 ml/min (HCC) Bun/creat 20/1.45 02/10/23  saw nephrology, 03/01/23 [facility]: increase Lasix to 40mg  every day, BMP one week.   Osteoarthritis, multiple sites Hx of R ankle fx, R hip total hip arthroplasty 11/30/21, ambulates with walker, uses w/c for mobility since last hospitalization.   Incontinent of urine Her urinary frequency/leakage, uses pads, it seems better since taking Myrbetriq in pm per Urology  Hypertension blood pressure is controlled, off Amlodipine started 07/07/21 by Nephrology, now taking Losartan, Carvedilol, Furosemide, Spironolactone.   Slow transit constipation No constipation, taking Colace bid, prn MiraLax  Osteoporosis  off Alendronate due to CKD, 11/05/20 DEXA t score -2.8, desires delaying DEXA  Fecal incontinence  prn Imodium   Patient is being discharged with the following home health services:    Patient is being discharged with the following durable medical equipment:    Patient has been advised to f/u with their PCP in 1-2 weeks to bring them up to date on their rehab stay.  Social services at facility was responsible for arranging this appointment.  Pt was provided with a 30 day supply of prescriptions for medications and refills must be obtained from their PCP.  For controlled substances, a more limited supply may be provided adequate until PCP appointment only.  Future labs/tests needed:  BMP  one week.

## 2023-03-02 NOTE — Assessment & Plan Note (Signed)
prn Imodium

## 2023-03-02 NOTE — Assessment & Plan Note (Signed)
No constipation, taking Colace bid, prn MiraLax

## 2023-03-02 NOTE — Assessment & Plan Note (Signed)
Her urinary frequency/leakage, uses pads, it seems better since taking Myrbetriq in pm per Urology ?

## 2023-03-02 NOTE — Assessment & Plan Note (Signed)
Bullous pemphigoid BLE, followed by Dermatology, on Prednisone. 02/23/23 dermatology Doxy 100mg  bid x 30 days, 0.05% bid prn x14 days.   legs, responded to steroid tx, 2 opened area RLE, slightly erythema RLE,  1 open area LLE,  serous drainage noted

## 2023-03-02 NOTE — Assessment & Plan Note (Signed)
BNP 265, EF 40-45%, started on Carvedilol, Losartan, Aldactone, resumed Furosemide and off  Metolazone, Amlodipine. Edema BLE, chronic, minimal on Aldactoneand Furosemide(increased to 40mg  03/01/23 per [facility]) now, 07/2021 venous US LLE negative DVT, prn Furosemide available. Followed by Cardiology.

## 2023-03-02 NOTE — Assessment & Plan Note (Signed)
blood pressure is controlled, off Amlodipine started 07/07/21 by Nephrology, now taking Losartan, Carvedilol, Furosemide, Spironolactone.

## 2023-03-02 NOTE — Assessment & Plan Note (Signed)
cardiology: no evidence of Afib, probably wandering atrial pacer

## 2023-03-03 ENCOUNTER — Encounter: Payer: Self-pay | Admitting: Nurse Practitioner

## 2023-03-03 ENCOUNTER — Encounter: Payer: Medicare Other | Admitting: Nurse Practitioner

## 2023-03-03 VITALS — Ht 66.0 in

## 2023-03-05 NOTE — Progress Notes (Signed)
This encounter was created in error - please disregard.

## 2023-03-07 DIAGNOSIS — U071 COVID-19: Secondary | ICD-10-CM | POA: Diagnosis not present

## 2023-03-08 DIAGNOSIS — I10 Essential (primary) hypertension: Secondary | ICD-10-CM | POA: Diagnosis not present

## 2023-03-08 DIAGNOSIS — U071 COVID-19: Secondary | ICD-10-CM | POA: Diagnosis not present

## 2023-03-10 DIAGNOSIS — U071 COVID-19: Secondary | ICD-10-CM | POA: Diagnosis not present

## 2023-03-11 DIAGNOSIS — U071 COVID-19: Secondary | ICD-10-CM | POA: Diagnosis not present

## 2023-03-14 ENCOUNTER — Encounter: Payer: Medicare Other | Admitting: Orthopedic Surgery

## 2023-03-14 ENCOUNTER — Encounter: Payer: Self-pay | Admitting: Orthopedic Surgery

## 2023-03-14 DIAGNOSIS — U071 COVID-19: Secondary | ICD-10-CM | POA: Diagnosis not present

## 2023-03-14 NOTE — Progress Notes (Signed)
This encounter was created in error - please disregard.

## 2023-03-16 DIAGNOSIS — R296 Repeated falls: Secondary | ICD-10-CM | POA: Diagnosis not present

## 2023-03-16 DIAGNOSIS — R29898 Other symptoms and signs involving the musculoskeletal system: Secondary | ICD-10-CM | POA: Diagnosis not present

## 2023-03-16 DIAGNOSIS — R2681 Unsteadiness on feet: Secondary | ICD-10-CM | POA: Diagnosis not present

## 2023-03-16 DIAGNOSIS — R41841 Cognitive communication deficit: Secondary | ICD-10-CM | POA: Diagnosis not present

## 2023-03-16 DIAGNOSIS — M6281 Muscle weakness (generalized): Secondary | ICD-10-CM | POA: Diagnosis not present

## 2023-03-18 ENCOUNTER — Encounter: Payer: Self-pay | Admitting: Sports Medicine

## 2023-03-18 ENCOUNTER — Non-Acute Institutional Stay (SKILLED_NURSING_FACILITY): Payer: Self-pay | Admitting: Sports Medicine

## 2023-03-18 DIAGNOSIS — I10 Essential (primary) hypertension: Secondary | ICD-10-CM | POA: Diagnosis not present

## 2023-03-18 DIAGNOSIS — S90415S Abrasion, left lesser toe(s), sequela: Secondary | ICD-10-CM | POA: Diagnosis not present

## 2023-03-18 DIAGNOSIS — R2681 Unsteadiness on feet: Secondary | ICD-10-CM | POA: Diagnosis not present

## 2023-03-18 DIAGNOSIS — R29898 Other symptoms and signs involving the musculoskeletal system: Secondary | ICD-10-CM | POA: Diagnosis not present

## 2023-03-18 DIAGNOSIS — R296 Repeated falls: Secondary | ICD-10-CM | POA: Diagnosis not present

## 2023-03-18 DIAGNOSIS — M6281 Muscle weakness (generalized): Secondary | ICD-10-CM | POA: Diagnosis not present

## 2023-03-18 DIAGNOSIS — R41841 Cognitive communication deficit: Secondary | ICD-10-CM | POA: Diagnosis not present

## 2023-03-18 NOTE — Progress Notes (Unsigned)
Provider:   Venita Sheffield MD Location:  [facility] Room Number: [id number] Place of Service:  ALF (13)  PCP: Mast, Man X, NP Patient Care Team: Mast, Man X, NP as PCP - General (Internal Medicine) Peggy Red, MD as PCP - Cardiology (Cardiology)  Extended Emergency Contact Information Primary Emergency Contact: Peggy [name] Address: [address]          [place], [place] [address] [name] of [place] Home Phone: [phone] Work Phone: [phone] Mobile Phone: [phone] Relation: Daughter Secondary Emergency Contact: Peggy [name] Home Phone: [phone] Relation: Daughter  Code Status:  Goals of Care: Advanced Directive information    03/14/2023    1:43 PM  Advanced Directives  Does Patient Have a Medical Advance Directive? Yes  Type of Estate agent of [place];Out of facility DNR (pink MOST or yellow form)  Does patient want to make changes to medical advance directive? No - Patient declined  Copy of Healthcare Power of Attorney in Chart? Yes - validated most recent copy scanned in chart (See row information)      Chief Complaint  Patient presents with   Acute Visit    Left toe pain    HPI: Patient is a 89 y.o. female seen today for admission to    Past Medical History:  Diagnosis Date   Anemia    Chronic kidney disease    stage 3   GERD (gastroesophageal reflux disease)    Hypertension    Past Surgical History:  Procedure Laterality Date   ORIF ANKLE FRACTURE Right 01/13/2021   Procedure: OPEN TREATMENT OF RIGHT TRIMALLEOLAR ANKLE FRACTURE WITHOUT POSTERIOR FIXATION, POSSIBLE SYNDESMOSIS;  Surgeon: Terance Hart, MD;  Location: [facility];  Service: Orthopedics;  Laterality: Right;   TOTAL HIP ARTHROPLASTY Right 12/02/2021   Procedure: TOTAL HIP ARTHROPLASTY;  Surgeon: Joen Laura, MD;  Location: [facility] ORS;  Service: Orthopedics;  Laterality: Right;    reports that  she quit smoking about 65 years ago. Her smoking use included cigarettes. She started smoking about 77 years ago. She has never used smokeless tobacco. She reports that she does not currently use alcohol. She reports that she does not use drugs. Social History   Socioeconomic History   Marital status: Widowed    Spouse name: Peggy [name]   Number of children: 2   Years of education: Not on file   Highest education level: Not on file  Occupational History   Occupation: teacher    Comment: retired  Tobacco Use   Smoking status: Former    Current packs/day: 0.00    Types: Cigarettes    Start date: 06/14/1945    Quit date: 06/14/1957    Years since quitting: 65.8   Smokeless tobacco: Never  Vaping Use   Vaping status: Never Used  Substance and Sexual Activity   Alcohol use: Not Currently   Drug use: No   Sexual activity: Not on file  Other Topics Concern   Not on file  Social History Narrative   Social History     Socioeconomic History       Marital status: Married   09/01/1954       Spouse name: Peggy [name]   Two people stay in the home on the fourth floor       Number of children: 2       Years of education:       Highest education level: Not on file    Exercise: yoga, walking     Social  Needs       Financial resource strain: Not on file       Food insecurity - worry: Not on file       Food insecurity - inability: Not on file       Transportation needs - medical: Not on file       Transportation needs - non-medical: Not on file     Occupational History       Not on file     Tobacco Use       Smoking status: Never Smoker       Smokeless tobacco: Never Used     Substance and Sexual Activity       Alcohol use: No       Drug use: No       Sexual activity: Not on file     Other Topics       Concerns:         Not on file     Social History Narrative       Not on file   Social Determinants of Health   Financial Resource Strain: Low Risk  (10/20/2017)   Overall Financial Resource  Strain (CARDIA)    Difficulty of Paying Living Expenses: Not hard at all  Food Insecurity: No Food Insecurity (01/16/2023)   Hunger Vital Sign    Worried About Running Out of Food in the Last Year: Never true    Ran Out of Food in the Last Year: Never true  Transportation Needs: No Transportation Needs (01/16/2023)   PRAPARE - Administrator, Civil Service (Medical): No    Lack of Transportation (Non-Medical): No  Physical Activity: Inactive (10/20/2017)   Exercise Vital Sign    Days of Exercise per Week: 0 days    Minutes of Exercise per Session: 0 min  Stress: No Stress Concern Present (10/20/2017)   [employer] of Occupational Health - Occupational Stress Questionnaire    Feeling of Stress : Only a little  Social Connections: Somewhat Isolated (10/20/2017)   Social Connection and Isolation Panel [NHANES]    Frequency of Communication with Friends and Family: More than three times a week    Frequency of Social Gatherings with Friends and Family: More than three times a week    Attends Religious Services: Never    Database administrator or Organizations: No    Attends Banker Meetings: Never    Marital Status: Married  Catering manager Violence: Not At Risk (01/16/2023)   Humiliation, Afraid, Rape, and Kick questionnaire    Fear of Current or Ex-Partner: No    Emotionally Abused: No    Physically Abused: No    Sexually Abused: No    Functional Status Survey:    Family History  Problem Relation Age of Onset   Heart failure Mother    Heart disease Father    Arthritis Sister     Health Maintenance  Topic Date Due   Zoster Vaccines- Shingrix (2 of 2) 10/22/2022   INFLUENZA VACCINE  01/13/2023   COVID-19 Vaccine (6 - 2023-24 season) 02/13/2023   Medicare Annual Wellness (AWV)  07/02/2023   DTaP/Tdap/Td (2 - Td or Tdap) 11/26/2029   Pneumonia Vaccine 23+ Years old  Completed   DEXA SCAN  Completed   HPV VACCINES  Aged Out    Allergies  Allergen  Reactions   Penicillins Swelling and Other (See Comments)    Facial swelling; can tolerate cephalosporins    Ace  Inhibitors Other (See Comments)    Reaction unknown   Flagyl [Metronidazole] Other (See Comments)    Reaction unknown    Outpatient Encounter Medications as of 03/18/2023  Medication Sig   acetaminophen (TYLENOL) 325 MG tablet Take 2 tablets (650 mg total) by mouth every 6 (six) hours as needed for mild pain, fever or headache.   aspirin EC 81 MG tablet Take 81 mg by mouth daily. Monday, Wednesday and Friday   bacitracin 500 UNIT/GM ointment Apply 1 Application topically daily. Apply to Lower leg wounds topically one time a day for healed bullous pemphigoid areas   camphor-menthol (SARNA) lotion Apply 1 Application topically in the morning and at bedtime.   carvedilol (COREG) 3.125 MG tablet Take 1 tablet (3.125 mg total) by mouth 2 (two) times daily with a meal.   cholecalciferol (VITAMIN D3) 25 MCG (1000 UNIT) tablet Take 1,000 Units by mouth every Monday, Wednesday, and Friday.   atorvastatin (LIPITOR) 20 MG tablet Take 4 tablets (80 mg total) by mouth daily.   ferrous sulfate (FEROSUL) 325 (65 FE) MG tablet Take 1 tablet (325 mg total) by mouth daily with breakfast. MON, WED, FRI   furosemide (LASIX) 20 MG tablet Take 1 tablet (20 mg total) by mouth daily as needed for edema or fluid.   guaiFENesin-dextromethorphan (ROBITUSSIN DM) 100-10 MG/5ML syrup Take 10 mLs by mouth every 6 (six) hours as needed for cough.   losartan (COZAAR) 25 MG tablet Take 1 tablet (25 mg total) by mouth daily.   melatonin 3 MG TABS tablet Take 1 tablet (3 mg total) by mouth at bedtime.   Multiple Vitamins-Minerals (ICAPS AREDS 2 PO) Take 1 capsule by mouth daily with breakfast.   MYRBETRIQ 25 MG TB24 tablet TAKE 2 TABLETS BY MOUTH ONCE  DAILY   pantoprazole (PROTONIX) 40 MG tablet Take 1 tablet (40 mg total) by mouth daily.   potassium chloride SA (KLOR-CON M) 20 MEQ tablet Take 1 tablet (20 mEq  total) by mouth daily.   senna-docusate (SENOKOT-S) 8.6-50 MG tablet Take 1 tablet by mouth at bedtime as needed for mild constipation.   spironolactone (ALDACTONE) 25 MG tablet Take 0.5 tablets (12.5 mg total) by mouth daily.   vitamin C (ASCORBIC ACID) 250 MG tablet Take 1 tablet (250 mg total) by mouth daily.   No facility-administered encounter medications on file as of 03/18/2023.    Review of Systems  Vitals:   03/18/23 0942  BP: 120/70  Pulse: 77  Resp: 20  Temp: (!) 97.4 F (36.3 C)  SpO2: 96%  Weight: 120 lb 3.2 oz (54.5 kg)  Height: 5\' 6"  (1.676 m)   Body mass index is 19.4 kg/m. Physical Exam  Labs reviewed: Basic Metabolic Panel: Recent Labs    01/16/23 2032 01/17/23 0403 01/17/23 1645 01/18/23 0419 01/19/23 0933 01/22/23 0000 01/25/23 0000  NA 130*   < > 134* 133* 129* 129* 132*  K <2.0*   < > 2.9* 4.1 2.9* 4.2 4.9  CL 87*   < > 95* 93* 92* 97* 99  CO2 30   < > 28 29 24 20  26*  GLUCOSE 106*   < > 106* 111* 125*  --   --   BUN 36*   < > 35* 33* 30* 29*  --   CREATININE 1.95*   < > 1.80* 2.00* 1.76* 1.5* 1.6*  CALCIUM 9.1   < > 8.5* 9.0 8.9 9.1 9.2  MG 1.9  --   --  1.7  --   --   --  PHOS  --   --   --  2.3*  --   --   --    < > = values in this interval not displayed.   Liver Function Tests: Recent Labs    01/16/23 1125 01/17/23 0403 01/25/23 0000  AST 30 31 16   ALT 13 17 13   ALKPHOS 65 54 73  BILITOT 0.5 0.6  --   PROT 6.7 5.5*  --   ALBUMIN 4.0 3.0* 3.4*   No results for input(s): "LIPASE", "AMYLASE" in the last 8760 hours. No results for input(s): "AMMONIA" in the last 8760 hours. CBC: Recent Labs    01/16/23 1125 01/17/23 0403 01/18/23 0419 01/25/23 0000  WBC 8.0 6.5 7.3 9.1  NEUTROABS 4.9  --   --  6,270.00  HGB 10.9* 9.8* 9.9* 9.5*  HCT 31.8* 30.2* 30.4* 28*  MCV 93.8 98.7 98.4  --   PLT 385 318 318 268   Cardiac Enzymes: No results for input(s): "CKTOTAL", "CKMB", "CKMBINDEX", "TROPONINI" in the last 8760  hours. BNP: Invalid input(s): "POCBNP" No results found for: "HGBA1C" Lab Results  Component Value Date   TSH 0.862 01/16/2023   Lab Results  Component Value Date   VITAMINB12 2,430 (H) 01/16/2023   Lab Results  Component Value Date   FOLATE >40.0 01/16/2023   Lab Results  Component Value Date   IRON 76 05/03/2022   TIBC 275 05/03/2022   FERRITIN 300 01/16/2023    Imaging and Procedures obtained prior to SNF admission: ECHOCARDIOGRAM COMPLETE  Result Date: 01/17/2023    ECHOCARDIOGRAM REPORT   Patient Name:   Peggy [name] Date of Exam: 01/17/2023 Medical Rec #:  [id number]          Height:       65.5 in Accession #:    [id number]         Weight:       120.4 lb Date of Birth:  15-Nov-1928         BSA:          1.603 m Patient Age:    89 years           BP:           122/73 mmHg Patient Gender: F                  HR:           78 bpm. Exam Location:  Inpatient Procedure: 2D Echo, Cardiac Doppler and Color Doppler Indications:    I48.92* Unspecified atrial flutter  History:        Patient has no prior history of Echocardiogram examinations.                 Risk Factors:Hypertension.  Sonographer:    Harriette Bouillon RDCS Referring Phys: [id number] ALEXIS HUGELMEYER IMPRESSIONS  1. LVEF challenging with atrial flutter. Left ventricular ejection fraction, by estimation, is 40 to 45%. The left ventricle has mildly decreased function. The left ventricle demonstrates global hypokinesis. Left ventricular diastolic parameters are consistent with Grade I diastolic dysfunction (impaired relaxation).  2. Right ventricular systolic function was not well visualized. The right ventricular size is not well visualized. There is normal pulmonary artery systolic pressure.  3. Left atrial size was moderately dilated.  4. Right atrial size was moderately dilated.  5. The mitral valve is degenerative. Trivial mitral valve regurgitation.  6. The aortic valve was not well visualized. Aortic valve regurgitation is mild.   7. There is  mild dilatation of the aortic root, measuring 40 mm. FINDINGS  Left Ventricle: LVEF challenging with atrial flutter. Left ventricular ejection fraction, by estimation, is 40 to 45%. The left ventricle has mildly decreased function. The left ventricle demonstrates global hypokinesis. The left ventricular internal cavity size was normal in size. There is borderline asymmetric left ventricular hypertrophy of the basal-septal segment. Left ventricular diastolic parameters are consistent with Grade I diastolic dysfunction (impaired relaxation). Right Ventricle: The right ventricular size is not well visualized. Right ventricular systolic function was not well visualized. There is normal pulmonary artery systolic pressure. The tricuspid regurgitant velocity is 2.02 m/s, and with an assumed right  atrial pressure of 3 mmHg, the estimated right ventricular systolic pressure is 19.3 mmHg. Left Atrium: Left atrial size was moderately dilated. Right Atrium: Right atrial size was moderately dilated. Pericardium: There is no evidence of pericardial effusion. Mitral Valve: The mitral valve is degenerative in appearance. Mild to moderate mitral annular calcification. Trivial mitral valve regurgitation. Tricuspid Valve: Tricuspid valve regurgitation is trivial. Aortic Valve: The aortic valve was not well visualized. Aortic valve regurgitation is mild. Pulmonic Valve: Pulmonic valve regurgitation is not visualized. Aorta: The aortic root and ascending aorta are structurally normal, with no evidence of dilitation. There is mild dilatation of the aortic root, measuring 40 mm. IAS/Shunts: The interatrial septum appears to be lipomatous. The interatrial septum was not well visualized.  LEFT VENTRICLE PLAX 2D LVIDd:         5.10 cm LVIDs:         3.90 cm LV PW:         0.90 cm LV IVS:        0.90 cm LVOT diam:     2.10 cm LV SV:         49 LV SV Index:   30 LVOT Area:     3.46 cm  RIGHT VENTRICLE         IVC TAPSE (M-mode):  1.4 cm  IVC diam: 1.20 cm LEFT ATRIUM             Index LA diam:        3.40 cm 2.12 cm/m LA Vol (A2C):   28.8 ml 17.96 ml/m LA Vol (A4C):   57.6 ml 35.93 ml/m LA Biplane Vol: 42.1 ml 26.26 ml/m  AORTIC VALVE LVOT Vmax:   85.33 cm/s LVOT Vmean:  56.333 cm/s LVOT VTI:    0.141 m  AORTA Ao Root diam: 4.00 cm Ao Asc diam:  3.50 cm MITRAL VALVE                TRICUSPID VALVE MV Area (PHT): 3.99 cm     TR Peak grad:   16.3 mmHg MV Decel Time: 190 msec     TR Vmax:        202.00 cm/s MV E velocity: 49.00 cm/s MV A velocity: 109.00 cm/s  SHUNTS MV E/A ratio:  0.45         Systemic VTI:  0.14 m                             Systemic Diam: 2.10 cm Carolan Clines Electronically signed by Carolan Clines Signature Date/Time: 01/17/2023/12:52:38 PM    Final    DG Forearm Left  Result Date: 01/16/2023 CLINICAL DATA:  Fall.  Pain. EXAM: LEFT FOREARM - 2 VIEW COMPARISON:  None Available. FINDINGS: There is no evidence of fracture or other focal  bone lesions. Soft tissues are unremarkable. IMPRESSION: Negative. Electronically Signed   By: Paulina Fusi M.D.   On: 01/16/2023 12:10   DG Hips Bilat W or Wo Pelvis 3-4 Views  Result Date: 01/16/2023 CLINICAL DATA:  Larey Seat.  Hip and pelvic pain. EXAM: DG HIP (WITH OR WITHOUT PELVIS) 3-4V BILAT COMPARISON:  12/02/2021 FINDINGS: Previous hip replacement on the right. No unexpected finding relative to that. No evidence of pelvic fracture. The left hip shows mild degenerative arthritis but no acute traumatic finding. Calcified leiomyoma incidentally noted in the central pelvis. IMPRESSION: No acute or traumatic finding. Previous right hip replacement. Electronically Signed   By: Paulina Fusi M.D.   On: 01/16/2023 12:09   DG Ribs Unilateral W/Chest Left  Result Date: 01/16/2023 CLINICAL DATA:  Fall with left arm pain and left chest pain EXAM: LEFT RIBS AND CHEST - 3+ VIEW COMPARISON:  11/30/2021 FINDINGS: Hiatal hernia. Heart size is normal. Tortuous aorta. The lungs are clear. No  pneumothorax or hemothorax. Left rib films do not show a fracture. Chronic degenerative changes affect the shoulders. Scoliosis of the spine. IMPRESSION: No acute or traumatic finding. Hiatal hernia. Tortuous aorta. Scoliosis. Chronic degenerative changes of the shoulders. Electronically Signed   By: Paulina Fusi M.D.   On: 01/16/2023 12:08   CT Head Wo Contrast  Result Date: 01/16/2023 CLINICAL DATA:  Fall out of bed with head and neck pain. EXAM: CT HEAD WITHOUT CONTRAST CT CERVICAL SPINE WITHOUT CONTRAST TECHNIQUE: Multidetector CT imaging of the head and cervical spine was performed following the standard protocol without intravenous contrast. Multiplanar CT image reconstructions of the cervical spine were also generated. RADIATION DOSE REDUCTION: This exam was performed according to the departmental dose-optimization program which includes automated exposure control, adjustment of the mA and/or kV according to patient size and/or use of iterative reconstruction technique. COMPARISON:  None Available. FINDINGS: CT HEAD FINDINGS Brain: No evidence of acute infarction, hemorrhage, hydrocephalus, extra-axial collection or mass lesion/mass effect. There is mild cerebral volume loss with associated ex vacuo dilatation. Periventricular white matter hypoattenuation likely represents chronic small vessel ischemic disease. Vascular: There are vascular calcifications in the carotid siphons. Skull: Normal. Negative for fracture or focal lesion. Sinuses/Orbits: No acute finding. Other: None. CT CERVICAL SPINE FINDINGS Alignment: No traumatic listhesis. Skull base and vertebrae: No acute fracture. No primary bone lesion or focal pathologic process. Soft tissues and spinal canal: No prevertebral fluid or swelling. No visible canal hematoma. Disc levels: Moderate to severe multilevel degenerative disc and joint disease. Upper chest: Negative. Other: None. IMPRESSION: 1. No acute intracranial process. 2. No acute osseous  injury in the cervical spine. Electronically Signed   By: Romona Curls M.D.   On: 01/16/2023 11:53   CT Cervical Spine Wo Contrast  Result Date: 01/16/2023 CLINICAL DATA:  Fall out of bed with head and neck pain. EXAM: CT HEAD WITHOUT CONTRAST CT CERVICAL SPINE WITHOUT CONTRAST TECHNIQUE: Multidetector CT imaging of the head and cervical spine was performed following the standard protocol without intravenous contrast. Multiplanar CT image reconstructions of the cervical spine were also generated. RADIATION DOSE REDUCTION: This exam was performed according to the departmental dose-optimization program which includes automated exposure control, adjustment of the mA and/or kV according to patient size and/or use of iterative reconstruction technique. COMPARISON:  None Available. FINDINGS: CT HEAD FINDINGS Brain: No evidence of acute infarction, hemorrhage, hydrocephalus, extra-axial collection or mass lesion/mass effect. There is mild cerebral volume loss with associated ex vacuo dilatation. Periventricular white matter  hypoattenuation likely represents chronic small vessel ischemic disease. Vascular: There are vascular calcifications in the carotid siphons. Skull: Normal. Negative for fracture or focal lesion. Sinuses/Orbits: No acute finding. Other: None. CT CERVICAL SPINE FINDINGS Alignment: No traumatic listhesis. Skull base and vertebrae: No acute fracture. No primary bone lesion or focal pathologic process. Soft tissues and spinal canal: No prevertebral fluid or swelling. No visible canal hematoma. Disc levels: Moderate to severe multilevel degenerative disc and joint disease. Upper chest: Negative. Other: None. IMPRESSION: 1. No acute intracranial process. 2. No acute osseous injury in the cervical spine. Electronically Signed   By: Romona Curls M.D.   On: 01/16/2023 11:53    Assessment/Plan There are no diagnoses linked to this encounter.   Family/ staff Communication:   Labs/tests ordered:

## 2023-03-21 ENCOUNTER — Encounter: Payer: Self-pay | Admitting: Sports Medicine

## 2023-03-21 DIAGNOSIS — M6281 Muscle weakness (generalized): Secondary | ICD-10-CM | POA: Diagnosis not present

## 2023-03-21 DIAGNOSIS — R2681 Unsteadiness on feet: Secondary | ICD-10-CM | POA: Diagnosis not present

## 2023-03-21 DIAGNOSIS — R41841 Cognitive communication deficit: Secondary | ICD-10-CM | POA: Diagnosis not present

## 2023-03-21 DIAGNOSIS — R29898 Other symptoms and signs involving the musculoskeletal system: Secondary | ICD-10-CM | POA: Diagnosis not present

## 2023-03-21 DIAGNOSIS — R296 Repeated falls: Secondary | ICD-10-CM | POA: Diagnosis not present

## 2023-03-22 DIAGNOSIS — R2681 Unsteadiness on feet: Secondary | ICD-10-CM | POA: Diagnosis not present

## 2023-03-22 DIAGNOSIS — R29898 Other symptoms and signs involving the musculoskeletal system: Secondary | ICD-10-CM | POA: Diagnosis not present

## 2023-03-22 DIAGNOSIS — I878 Other specified disorders of veins: Secondary | ICD-10-CM | POA: Diagnosis not present

## 2023-03-22 DIAGNOSIS — D692 Other nonthrombocytopenic purpura: Secondary | ICD-10-CM | POA: Diagnosis not present

## 2023-03-22 DIAGNOSIS — M6281 Muscle weakness (generalized): Secondary | ICD-10-CM | POA: Diagnosis not present

## 2023-03-22 DIAGNOSIS — L120 Bullous pemphigoid: Secondary | ICD-10-CM | POA: Diagnosis not present

## 2023-03-22 DIAGNOSIS — M810 Age-related osteoporosis without current pathological fracture: Secondary | ICD-10-CM | POA: Diagnosis not present

## 2023-03-22 DIAGNOSIS — R296 Repeated falls: Secondary | ICD-10-CM | POA: Diagnosis not present

## 2023-03-22 DIAGNOSIS — R41841 Cognitive communication deficit: Secondary | ICD-10-CM | POA: Diagnosis not present

## 2023-03-23 DIAGNOSIS — R41841 Cognitive communication deficit: Secondary | ICD-10-CM | POA: Diagnosis not present

## 2023-03-23 DIAGNOSIS — R296 Repeated falls: Secondary | ICD-10-CM | POA: Diagnosis not present

## 2023-03-23 DIAGNOSIS — M6281 Muscle weakness (generalized): Secondary | ICD-10-CM | POA: Diagnosis not present

## 2023-03-23 DIAGNOSIS — R2681 Unsteadiness on feet: Secondary | ICD-10-CM | POA: Diagnosis not present

## 2023-03-23 DIAGNOSIS — R29898 Other symptoms and signs involving the musculoskeletal system: Secondary | ICD-10-CM | POA: Diagnosis not present

## 2023-03-24 DIAGNOSIS — R41841 Cognitive communication deficit: Secondary | ICD-10-CM | POA: Diagnosis not present

## 2023-03-24 DIAGNOSIS — M6281 Muscle weakness (generalized): Secondary | ICD-10-CM | POA: Diagnosis not present

## 2023-03-24 DIAGNOSIS — R2681 Unsteadiness on feet: Secondary | ICD-10-CM | POA: Diagnosis not present

## 2023-03-24 DIAGNOSIS — R29898 Other symptoms and signs involving the musculoskeletal system: Secondary | ICD-10-CM | POA: Diagnosis not present

## 2023-03-24 DIAGNOSIS — R296 Repeated falls: Secondary | ICD-10-CM | POA: Diagnosis not present

## 2023-03-25 DIAGNOSIS — M6281 Muscle weakness (generalized): Secondary | ICD-10-CM | POA: Diagnosis not present

## 2023-03-25 DIAGNOSIS — R296 Repeated falls: Secondary | ICD-10-CM | POA: Diagnosis not present

## 2023-03-25 DIAGNOSIS — R41841 Cognitive communication deficit: Secondary | ICD-10-CM | POA: Diagnosis not present

## 2023-03-25 DIAGNOSIS — R2681 Unsteadiness on feet: Secondary | ICD-10-CM | POA: Diagnosis not present

## 2023-03-25 DIAGNOSIS — R29898 Other symptoms and signs involving the musculoskeletal system: Secondary | ICD-10-CM | POA: Diagnosis not present

## 2023-03-28 DIAGNOSIS — M6281 Muscle weakness (generalized): Secondary | ICD-10-CM | POA: Diagnosis not present

## 2023-03-28 DIAGNOSIS — R29898 Other symptoms and signs involving the musculoskeletal system: Secondary | ICD-10-CM | POA: Diagnosis not present

## 2023-03-28 DIAGNOSIS — R296 Repeated falls: Secondary | ICD-10-CM | POA: Diagnosis not present

## 2023-03-28 DIAGNOSIS — R2681 Unsteadiness on feet: Secondary | ICD-10-CM | POA: Diagnosis not present

## 2023-03-28 DIAGNOSIS — R41841 Cognitive communication deficit: Secondary | ICD-10-CM | POA: Diagnosis not present

## 2023-03-30 DIAGNOSIS — R41841 Cognitive communication deficit: Secondary | ICD-10-CM | POA: Diagnosis not present

## 2023-03-30 DIAGNOSIS — R29898 Other symptoms and signs involving the musculoskeletal system: Secondary | ICD-10-CM | POA: Diagnosis not present

## 2023-03-30 DIAGNOSIS — R2681 Unsteadiness on feet: Secondary | ICD-10-CM | POA: Diagnosis not present

## 2023-03-30 DIAGNOSIS — R296 Repeated falls: Secondary | ICD-10-CM | POA: Diagnosis not present

## 2023-03-30 DIAGNOSIS — M6281 Muscle weakness (generalized): Secondary | ICD-10-CM | POA: Diagnosis not present

## 2023-03-31 DIAGNOSIS — R41841 Cognitive communication deficit: Secondary | ICD-10-CM | POA: Diagnosis not present

## 2023-03-31 DIAGNOSIS — R2681 Unsteadiness on feet: Secondary | ICD-10-CM | POA: Diagnosis not present

## 2023-03-31 DIAGNOSIS — M6281 Muscle weakness (generalized): Secondary | ICD-10-CM | POA: Diagnosis not present

## 2023-03-31 DIAGNOSIS — R29898 Other symptoms and signs involving the musculoskeletal system: Secondary | ICD-10-CM | POA: Diagnosis not present

## 2023-03-31 DIAGNOSIS — R296 Repeated falls: Secondary | ICD-10-CM | POA: Diagnosis not present

## 2023-04-01 DIAGNOSIS — R296 Repeated falls: Secondary | ICD-10-CM | POA: Diagnosis not present

## 2023-04-01 DIAGNOSIS — M6281 Muscle weakness (generalized): Secondary | ICD-10-CM | POA: Diagnosis not present

## 2023-04-01 DIAGNOSIS — R29898 Other symptoms and signs involving the musculoskeletal system: Secondary | ICD-10-CM | POA: Diagnosis not present

## 2023-04-01 DIAGNOSIS — R2681 Unsteadiness on feet: Secondary | ICD-10-CM | POA: Diagnosis not present

## 2023-04-01 DIAGNOSIS — R41841 Cognitive communication deficit: Secondary | ICD-10-CM | POA: Diagnosis not present

## 2023-04-02 DIAGNOSIS — R2681 Unsteadiness on feet: Secondary | ICD-10-CM | POA: Diagnosis not present

## 2023-04-02 DIAGNOSIS — R41841 Cognitive communication deficit: Secondary | ICD-10-CM | POA: Diagnosis not present

## 2023-04-02 DIAGNOSIS — M6281 Muscle weakness (generalized): Secondary | ICD-10-CM | POA: Diagnosis not present

## 2023-04-02 DIAGNOSIS — R29898 Other symptoms and signs involving the musculoskeletal system: Secondary | ICD-10-CM | POA: Diagnosis not present

## 2023-04-02 DIAGNOSIS — R296 Repeated falls: Secondary | ICD-10-CM | POA: Diagnosis not present

## 2023-04-04 ENCOUNTER — Non-Acute Institutional Stay (SKILLED_NURSING_FACILITY): Payer: Self-pay | Admitting: Sports Medicine

## 2023-04-04 ENCOUNTER — Encounter: Payer: Self-pay | Admitting: Sports Medicine

## 2023-04-04 DIAGNOSIS — R21 Rash and other nonspecific skin eruption: Secondary | ICD-10-CM

## 2023-04-04 DIAGNOSIS — R41841 Cognitive communication deficit: Secondary | ICD-10-CM | POA: Diagnosis not present

## 2023-04-04 DIAGNOSIS — R296 Repeated falls: Secondary | ICD-10-CM | POA: Diagnosis not present

## 2023-04-04 DIAGNOSIS — M7989 Other specified soft tissue disorders: Secondary | ICD-10-CM | POA: Diagnosis not present

## 2023-04-04 DIAGNOSIS — R29898 Other symptoms and signs involving the musculoskeletal system: Secondary | ICD-10-CM | POA: Diagnosis not present

## 2023-04-04 DIAGNOSIS — R2681 Unsteadiness on feet: Secondary | ICD-10-CM | POA: Diagnosis not present

## 2023-04-04 DIAGNOSIS — M6281 Muscle weakness (generalized): Secondary | ICD-10-CM | POA: Diagnosis not present

## 2023-04-04 NOTE — Progress Notes (Signed)
Provider:  Andree Coss Location:   [facility]   Place of Service:   Assisted living  PCP: Mast, Man X, NP Patient Care Team: Mast, Man X, NP as PCP - General (Internal Medicine) [name], MD as PCP - Cardiology (Cardiology)  Extended Emergency Contact Information Primary Emergency Contact: Gavia,[name] Address: [address]          [place], [place] [address] [name] of [place] Home Phone: [phone] Work Phone: [phone] Mobile Phone: [phone] Relation: Daughter Secondary Emergency Contact: [name],[name] Home Phone: [phone] Relation: Daughter  Code Status:  Goals of Care: Advanced Directive information    03/18/2023   10:58 AM  Advanced Directives  Does Patient Have a Medical Advance Directive? Yes  Type of Advance Directive Out of facility DNR (pink MOST or yellow form);Healthcare Power of Attorney  Does patient want to make changes to medical advance directive? No - Patient declined  Copy of Healthcare Power of Attorney in Chart? Yes - validated most recent copy scanned in chart (See row information)  Pre-existing out of facility DNR order (yellow form or pink MOST form) Yellow form placed in chart (order not valid for inpatient use);Pink MOST form placed in chart (order not valid for inpatient use)      No chief complaint on file.   HPI: Patient is a 89 y.o. female seen today for acute visit for Rash on her left arm.  Pt seen and examined in her room. Daughter present at bedside.  Daughter states that she noticed her mom c/o itching her left arm and noticed few red bumps. She is wondering if she is having another flare up. Pt has excoriations on her left arm, no lesions on rt arm, belly or back.  Chronic lower extremity swelling - daughter noticed increased redness and swelling in both legs  C/o dry skin and bumps around her ankle. Denies fevers, chills, cough, SOB, abdominal pain, nausea, vomiting,  dysuria, hematuria, bloody or dark stools.  Past Medical History:  Diagnosis Date   Anemia    Chronic kidney disease    stage 3   GERD (gastroesophageal reflux disease)    Hypertension    Past Surgical History:  Procedure Laterality Date   ORIF ANKLE FRACTURE Right 01/13/2021   Procedure: OPEN TREATMENT OF RIGHT TRIMALLEOLAR ANKLE FRACTURE WITHOUT POSTERIOR FIXATION, POSSIBLE SYNDESMOSIS;  Surgeon: Terance Hart, MD;  Location: [facility] OR;  Service: Orthopedics;  Laterality: Right;   TOTAL HIP ARTHROPLASTY Right 12/02/2021   Procedure: TOTAL HIP ARTHROPLASTY;  Surgeon: Joen Laura, MD;  Location: [facility] ORS;  Service: Orthopedics;  Laterality: Right;    reports that she quit smoking about 65 years ago. Her smoking use included cigarettes. She started smoking about 77 years ago. She has never used smokeless tobacco. She reports that she does not currently use alcohol. She reports that she does not use drugs. Social History   Socioeconomic History   Marital status: Widowed    Spouse name: Adela Lank   Number of children: 2   Years of education: Not on file   Highest education level: Not on file  Occupational History   Occupation: teacher    Comment: retired  Tobacco Use   Smoking status: Former    Current packs/day: 0.00    Types: Cigarettes    Start date: 06/14/1945    Quit date: 06/14/1957    Years since quitting: 65.8   Smokeless tobacco: Never  Vaping Use   Vaping status: Never Used  Substance and Sexual  Activity   Alcohol use: Not Currently   Drug use: No   Sexual activity: Not on file  Other Topics Concern   Not on file  Social History Narrative   Social History     Socioeconomic History       Marital status: Married   09/01/1954       Spouse name: Adela Lank   Two people stay in the home on the fourth floor       Number of children: 2       Years of education:       Highest education level: Not on file    Exercise: yoga, walking     Social Needs       Financial  resource strain: Not on file       Food insecurity - worry: Not on file       Food insecurity - inability: Not on file       Transportation needs - medical: Not on file       Transportation needs - non-medical: Not on file     Occupational History       Not on file     Tobacco Use       Smoking status: Never Smoker       Smokeless tobacco: Never Used     Substance and Sexual Activity       Alcohol use: No       Drug use: No       Sexual activity: Not on file     Other Topics       Concerns:         Not on file     Social History Narrative       Not on file   Social Determinants of Health   Financial Resource Strain: Low Risk  (10/20/2017)   Overall Financial Resource Strain (CARDIA)    Difficulty of Paying Living Expenses: Not hard at all  Food Insecurity: No Food Insecurity (01/16/2023)   Hunger Vital Sign    Worried About Running Out of Food in the Last Year: Never true    Ran Out of Food in the Last Year: Never true  Transportation Needs: No Transportation Needs (01/16/2023)   PRAPARE - Administrator, Civil Service (Medical): No    Lack of Transportation (Non-Medical): No  Physical Activity: Inactive (10/20/2017)   Exercise Vital Sign    Days of Exercise per Week: 0 days    Minutes of Exercise per Session: 0 min  Stress: No Stress Concern Present (10/20/2017)   [employer] of Occupational Health - Occupational Stress Questionnaire    Feeling of Stress : Only a little  Social Connections: Somewhat Isolated (10/20/2017)   Social Connection and Isolation Panel [NHANES]    Frequency of Communication with Friends and Family: More than three times a week    Frequency of Social Gatherings with Friends and Family: More than three times a week    Attends Religious Services: Never    Database administrator or Organizations: No    Attends Banker Meetings: Never    Marital Status: Married  Catering manager Violence: Not At Risk (01/16/2023)   Humiliation,  Afraid, Rape, and Kick questionnaire    Fear of Current or Ex-Partner: No    Emotionally Abused: No    Physically Abused: No    Sexually Abused: No    Functional Status Survey:    Family History  Problem Relation Age of  Onset   Heart failure Mother    Heart disease Father    Arthritis Sister     Health Maintenance  Topic Date Due   Zoster Vaccines- Shingrix (2 of 2) 10/22/2022   INFLUENZA VACCINE  01/13/2023   COVID-19 Vaccine (6 - 2023-24 season) 02/13/2023   Medicare Annual Wellness (AWV)  07/02/2023   DTaP/Tdap/Td (2 - Td or Tdap) 11/26/2029   Pneumonia Vaccine 32+ Years old  Completed   DEXA SCAN  Completed   HPV VACCINES  Aged Out    Allergies  Allergen Reactions   Penicillins Swelling and Other (See Comments)    Facial swelling; can tolerate cephalosporins    Ace Inhibitors Other (See Comments)    Reaction unknown   Flagyl [Metronidazole] Other (See Comments)    Reaction unknown    Outpatient Encounter Medications as of 04/04/2023  Medication Sig   acetaminophen (TYLENOL) 325 MG tablet Take 2 tablets (650 mg total) by mouth every 6 (six) hours as needed for mild pain, fever or headache.   aspirin EC 81 MG tablet Take 81 mg by mouth daily. Monday, Wednesday and Friday   atorvastatin (LIPITOR) 20 MG tablet Take 4 tablets (80 mg total) by mouth daily.   bacitracin 500 UNIT/GM ointment Apply 1 Application topically daily. Apply to Lower leg wounds topically one time a day for healed bullous pemphigoid areas   camphor-menthol (SARNA) lotion Apply 1 Application topically in the morning and at bedtime.   carbamide peroxide (DEBROX) 6.5 % OTIC solution Place 5 drops into both ears 4 (four) times daily.   carvedilol (COREG) 3.125 MG tablet Take 1 tablet (3.125 mg total) by mouth 2 (two) times daily with a meal.   cholecalciferol (VITAMIN D3) 25 MCG (1000 UNIT) tablet Take 1,000 Units by mouth every Monday, Wednesday, and Friday.   ferrous sulfate (FEROSUL) 325 (65 FE)  MG tablet Take 1 tablet (325 mg total) by mouth daily with breakfast. MON, WED, FRI   furosemide (LASIX) 20 MG tablet Take 1 tablet (20 mg total) by mouth daily as needed for edema or fluid.   guaiFENesin-dextromethorphan (ROBITUSSIN DM) 100-10 MG/5ML syrup Take 10 mLs by mouth every 6 (six) hours as needed for cough.   losartan (COZAAR) 25 MG tablet Take 1 tablet (25 mg total) by mouth daily.   melatonin 3 MG TABS tablet Take 1 tablet (3 mg total) by mouth at bedtime.   Multiple Vitamins-Minerals (ICAPS AREDS 2 PO) Take 1 capsule by mouth daily with breakfast.   MYRBETRIQ 25 MG TB24 tablet TAKE 2 TABLETS BY MOUTH ONCE  DAILY   pantoprazole (PROTONIX) 40 MG tablet Take 1 tablet (40 mg total) by mouth daily.   potassium chloride SA (KLOR-CON M) 20 MEQ tablet Take 1 tablet (20 mEq total) by mouth daily.   senna-docusate (SENOKOT-S) 8.6-50 MG tablet Take 1 tablet by mouth at bedtime as needed for mild constipation.   spironolactone (ALDACTONE) 25 MG tablet Take 0.5 tablets (12.5 mg total) by mouth daily.   vitamin C (ASCORBIC ACID) 250 MG tablet Take 1 tablet (250 mg total) by mouth daily.   No facility-administered encounter medications on file as of 04/04/2023.    Review of Systems  Constitutional:  Negative for chills and fever.  HENT:  Negative for sore throat.   Respiratory:  Negative for cough, shortness of breath and wheezing.   Cardiovascular:  Positive for leg swelling. Negative for chest pain and palpitations.  Gastrointestinal:  Negative for abdominal distention, abdominal pain, blood  in stool, constipation, diarrhea, nausea and vomiting.  Genitourinary:  Negative for dysuria, frequency and urgency.  Skin:  Positive for rash.  Neurological:  Negative for dizziness, weakness and numbness.  Psychiatric/Behavioral:  Negative for confusion.     There were no vitals filed for this visit. There is no height or weight on file to calculate BMI. Physical Exam Constitutional:       Appearance: Normal appearance.  HENT:     Head: Normocephalic and atraumatic.  Cardiovascular:     Rate and Rhythm: Normal rate and regular rhythm.  Pulmonary:     Effort: Pulmonary effort is normal. No respiratory distress.     Breath sounds: Normal breath sounds. No wheezing.  Abdominal:     General: Bowel sounds are normal. There is no distension.     Tenderness: There is no abdominal tenderness. There is no guarding or rebound.  Musculoskeletal:        General: Swelling (bilateral lower extremity swelling, redness noted. Dry skin around ankles. 1cm open wpound on her Rt lower leg with surronding erythema, no drainage noted.) present.  Skin:    Comments: Few excoriations and tiny areas of  skin break down from scratching on her left arm     Neurological:     Mental Status: She is alert. Mental status is at baseline.     Sensory: No sensory deficit.     Motor: No weakness.     Labs reviewed: Basic Metabolic Panel: Recent Labs    01/16/23 2032 01/17/23 0403 01/17/23 1645 01/18/23 0419 01/19/23 0933 01/22/23 0000 01/25/23 0000  NA 130*   < > 134* 133* 129* 129* 132*  K <2.0*   < > 2.9* 4.1 2.9* 4.2 4.9  CL 87*   < > 95* 93* 92* 97* 99  CO2 30   < > 28 29 24 20  26*  GLUCOSE 106*   < > 106* 111* 125*  --   --   BUN 36*   < > 35* 33* 30* 29*  --   CREATININE 1.95*   < > 1.80* 2.00* 1.76* 1.5* 1.6*  CALCIUM 9.1   < > 8.5* 9.0 8.9 9.1 9.2  MG 1.9  --   --  1.7  --   --   --   PHOS  --   --   --  2.3*  --   --   --    < > = values in this interval not displayed.   Liver Function Tests: Recent Labs    01/16/23 1125 01/17/23 0403 01/25/23 0000  AST 30 31 16   ALT 13 17 13   ALKPHOS 65 54 73  BILITOT 0.5 0.6  --   PROT 6.7 5.5*  --   ALBUMIN 4.0 3.0* 3.4*   No results for input(s): "LIPASE", "AMYLASE" in the last 8760 hours. No results for input(s): "AMMONIA" in the last 8760 hours. CBC: Recent Labs    01/16/23 1125 01/17/23 0403 01/18/23 0419 01/25/23 0000   WBC 8.0 6.5 7.3 9.1  NEUTROABS 4.9  --   --  6,270.00  HGB 10.9* 9.8* 9.9* 9.5*  HCT 31.8* 30.2* 30.4* 28*  MCV 93.8 98.7 98.4  --   PLT 385 318 318 268   Cardiac Enzymes: No results for input(s): "CKTOTAL", "CKMB", "CKMBINDEX", "TROPONINI" in the last 8760 hours. BNP: Invalid input(s): "POCBNP" No results found for: "HGBA1C" Lab Results  Component Value Date   TSH 0.862 01/16/2023   Lab Results  Component Value Date  VITAMINB12 2,430 (H) 01/16/2023   Lab Results  Component Value Date   FOLATE >40.0 01/16/2023   Lab Results  Component Value Date   IRON 76 05/03/2022   TIBC 275 05/03/2022   FERRITIN 300 01/16/2023    Imaging and Procedures obtained prior to SNF admission: ECHOCARDIOGRAM COMPLETE  Result Date: 01/17/2023    ECHOCARDIOGRAM REPORT   Patient Name:   Peggy [name] Date of Exam: 01/17/2023 Medical Rec #:  [id number]          Height:       65.5 in Accession #:    [id number]         Weight:       120.4 lb Date of Birth:  June 08, 1929         BSA:          1.603 m Patient Age:    89 years           BP:           122/73 mmHg Patient Gender: F                  HR:           78 bpm. Exam Location:  Inpatient Procedure: 2D Echo, Cardiac Doppler and Color Doppler Indications:    I48.92* Unspecified atrial flutter  History:        Patient has no prior history of Echocardiogram examinations.                 Risk Factors:Hypertension.  Sonographer:    Harriette Bouillon RDCS Referring Phys: [id number] ALEXIS HUGELMEYER IMPRESSIONS  1. LVEF challenging with atrial flutter. Left ventricular ejection fraction, by estimation, is 40 to 45%. The left ventricle has mildly decreased function. The left ventricle demonstrates global hypokinesis. Left ventricular diastolic parameters are consistent with Grade I diastolic dysfunction (impaired relaxation).  2. Right ventricular systolic function was not well visualized. The right ventricular size is not well visualized. There is normal pulmonary  artery systolic pressure.  3. Left atrial size was moderately dilated.  4. Right atrial size was moderately dilated.  5. The mitral valve is degenerative. Trivial mitral valve regurgitation.  6. The aortic valve was not well visualized. Aortic valve regurgitation is mild.  7. There is mild dilatation of the aortic root, measuring 40 mm. FINDINGS  Left Ventricle: LVEF challenging with atrial flutter. Left ventricular ejection fraction, by estimation, is 40 to 45%. The left ventricle has mildly decreased function. The left ventricle demonstrates global hypokinesis. The left ventricular internal cavity size was normal in size. There is borderline asymmetric left ventricular hypertrophy of the basal-septal segment. Left ventricular diastolic parameters are consistent with Grade I diastolic dysfunction (impaired relaxation). Right Ventricle: The right ventricular size is not well visualized. Right ventricular systolic function was not well visualized. There is normal pulmonary artery systolic pressure. The tricuspid regurgitant velocity is 2.02 m/s, and with an assumed right  atrial pressure of 3 mmHg, the estimated right ventricular systolic pressure is 19.3 mmHg. Left Atrium: Left atrial size was moderately dilated. Right Atrium: Right atrial size was moderately dilated. Pericardium: There is no evidence of pericardial effusion. Mitral Valve: The mitral valve is degenerative in appearance. Mild to moderate mitral annular calcification. Trivial mitral valve regurgitation. Tricuspid Valve: Tricuspid valve regurgitation is trivial. Aortic Valve: The aortic valve was not well visualized. Aortic valve regurgitation is mild. Pulmonic Valve: Pulmonic valve regurgitation is not visualized. Aorta: The aortic root and ascending aorta are structurally  normal, with no evidence of dilitation. There is mild dilatation of the aortic root, measuring 40 mm. IAS/Shunts: The interatrial septum appears to be lipomatous. The interatrial  septum was not well visualized.  LEFT VENTRICLE PLAX 2D LVIDd:         5.10 cm LVIDs:         3.90 cm LV PW:         0.90 cm LV IVS:        0.90 cm LVOT diam:     2.10 cm LV SV:         49 LV SV Index:   30 LVOT Area:     3.46 cm  RIGHT VENTRICLE         IVC TAPSE (M-mode): 1.4 cm  IVC diam: 1.20 cm LEFT ATRIUM             Index LA diam:        3.40 cm 2.12 cm/m LA Vol (A2C):   28.8 ml 17.96 ml/m LA Vol (A4C):   57.6 ml 35.93 ml/m LA Biplane Vol: 42.1 ml 26.26 ml/m  AORTIC VALVE LVOT Vmax:   85.33 cm/s LVOT Vmean:  56.333 cm/s LVOT VTI:    0.141 m  AORTA Ao Root diam: 4.00 cm Ao Asc diam:  3.50 cm MITRAL VALVE                TRICUSPID VALVE MV Area (PHT): 3.99 cm     TR Peak grad:   16.3 mmHg MV Decel Time: 190 msec     TR Vmax:        202.00 cm/s MV E velocity: 49.00 cm/s MV A velocity: 109.00 cm/s  SHUNTS MV E/A ratio:  0.45         Systemic VTI:  0.14 m                             Systemic Diam: 2.10 cm Carolan Clines Electronically signed by Carolan Clines Signature Date/Time: 01/17/2023/12:52:38 PM    Final    DG Forearm Left  Result Date: 01/16/2023 CLINICAL DATA:  Fall.  Pain. EXAM: LEFT FOREARM - 2 VIEW COMPARISON:  None Available. FINDINGS: There is no evidence of fracture or other focal bone lesions. Soft tissues are unremarkable. IMPRESSION: Negative. Electronically Signed   By: Paulina Fusi M.D.   On: 01/16/2023 12:10   DG Hips Bilat W or Wo Pelvis 3-4 Views  Result Date: 01/16/2023 CLINICAL DATA:  Larey Seat.  Hip and pelvic pain. EXAM: DG HIP (WITH OR WITHOUT PELVIS) 3-4V BILAT COMPARISON:  12/02/2021 FINDINGS: Previous hip replacement on the right. No unexpected finding relative to that. No evidence of pelvic fracture. The left hip shows mild degenerative arthritis but no acute traumatic finding. Calcified leiomyoma incidentally noted in the central pelvis. IMPRESSION: No acute or traumatic finding. Previous right hip replacement. Electronically Signed   By: Paulina Fusi M.D.   On: 01/16/2023 12:09    DG Ribs Unilateral W/Chest Left  Result Date: 01/16/2023 CLINICAL DATA:  Fall with left arm pain and left chest pain EXAM: LEFT RIBS AND CHEST - 3+ VIEW COMPARISON:  11/30/2021 FINDINGS: Hiatal hernia. Heart size is normal. Tortuous aorta. The lungs are clear. No pneumothorax or hemothorax. Left rib films do not show a fracture. Chronic degenerative changes affect the shoulders. Scoliosis of the spine. IMPRESSION: No acute or traumatic finding. Hiatal hernia. Tortuous aorta. Scoliosis. Chronic degenerative changes of the shoulders. Electronically  Signed   By: Paulina Fusi M.D.   On: 01/16/2023 12:08   CT Head Wo Contrast  Result Date: 01/16/2023 CLINICAL DATA:  Fall out of bed with head and neck pain. EXAM: CT HEAD WITHOUT CONTRAST CT CERVICAL SPINE WITHOUT CONTRAST TECHNIQUE: Multidetector CT imaging of the head and cervical spine was performed following the standard protocol without intravenous contrast. Multiplanar CT image reconstructions of the cervical spine were also generated. RADIATION DOSE REDUCTION: This exam was performed according to the departmental dose-optimization program which includes automated exposure control, adjustment of the mA and/or kV according to patient size and/or use of iterative reconstruction technique. COMPARISON:  None Available. FINDINGS: CT HEAD FINDINGS Brain: No evidence of acute infarction, hemorrhage, hydrocephalus, extra-axial collection or mass lesion/mass effect. There is mild cerebral volume loss with associated ex vacuo dilatation. Periventricular white matter hypoattenuation likely represents chronic small vessel ischemic disease. Vascular: There are vascular calcifications in the carotid siphons. Skull: Normal. Negative for fracture or focal lesion. Sinuses/Orbits: No acute finding. Other: None. CT CERVICAL SPINE FINDINGS Alignment: No traumatic listhesis. Skull base and vertebrae: No acute fracture. No primary bone lesion or focal pathologic process. Soft  tissues and spinal canal: No prevertebral fluid or swelling. No visible canal hematoma. Disc levels: Moderate to severe multilevel degenerative disc and joint disease. Upper chest: Negative. Other: None. IMPRESSION: 1. No acute intracranial process. 2. No acute osseous injury in the cervical spine. Electronically Signed   By: Romona Curls M.D.   On: 01/16/2023 11:53   CT Cervical Spine Wo Contrast  Result Date: 01/16/2023 CLINICAL DATA:  Fall out of bed with head and neck pain. EXAM: CT HEAD WITHOUT CONTRAST CT CERVICAL SPINE WITHOUT CONTRAST TECHNIQUE: Multidetector CT imaging of the head and cervical spine was performed following the standard protocol without intravenous contrast. Multiplanar CT image reconstructions of the cervical spine were also generated. RADIATION DOSE REDUCTION: This exam was performed according to the departmental dose-optimization program which includes automated exposure control, adjustment of the mA and/or kV according to patient size and/or use of iterative reconstruction technique. COMPARISON:  None Available. FINDINGS: CT HEAD FINDINGS Brain: No evidence of acute infarction, hemorrhage, hydrocephalus, extra-axial collection or mass lesion/mass effect. There is mild cerebral volume loss with associated ex vacuo dilatation. Periventricular white matter hypoattenuation likely represents chronic small vessel ischemic disease. Vascular: There are vascular calcifications in the carotid siphons. Skull: Normal. Negative for fracture or focal lesion. Sinuses/Orbits: No acute finding. Other: None. CT CERVICAL SPINE FINDINGS Alignment: No traumatic listhesis. Skull base and vertebrae: No acute fracture. No primary bone lesion or focal pathologic process. Soft tissues and spinal canal: No prevertebral fluid or swelling. No visible canal hematoma. Disc levels: Moderate to severe multilevel degenerative disc and joint disease. Upper chest: Negative. Other: None. IMPRESSION: 1. No acute  intracranial process. 2. No acute osseous injury in the cervical spine. Electronically Signed   By: Romona Curls M.D.   On: 01/16/2023 11:53    Assessment/Plan  1. Rash Will start  kenalog cream 0.1% Monitor for worsening rash   2. Swelling of lower extremity Pt noted with increased swelling and redness in both legs  Small open wound on her Rt lower leg with surrounding erythema , no drainage noted  Will check cbc, bmp, bnp Will increase lasix to 40 mg bid x 5 days and then resume to 40 mg once daily  Elevate feet  Use compression stockings Will start clindamycin 150 mg tid     Family/ staff Communication:  care plan discussed with the nursing staff  Labs/tests ordered:cbc, bmp, bnp  I spent greater than 35  minutes for the care of this patient in face to face time, chart review, clinical documentation, patient education.

## 2023-04-05 DIAGNOSIS — I872 Venous insufficiency (chronic) (peripheral): Secondary | ICD-10-CM | POA: Diagnosis not present

## 2023-04-05 DIAGNOSIS — I10 Essential (primary) hypertension: Secondary | ICD-10-CM | POA: Diagnosis not present

## 2023-04-05 DIAGNOSIS — D631 Anemia in chronic kidney disease: Secondary | ICD-10-CM | POA: Diagnosis not present

## 2023-04-05 DIAGNOSIS — I5023 Acute on chronic systolic (congestive) heart failure: Secondary | ICD-10-CM | POA: Diagnosis not present

## 2023-04-06 DIAGNOSIS — R41841 Cognitive communication deficit: Secondary | ICD-10-CM | POA: Diagnosis not present

## 2023-04-06 DIAGNOSIS — R296 Repeated falls: Secondary | ICD-10-CM | POA: Diagnosis not present

## 2023-04-06 DIAGNOSIS — R2681 Unsteadiness on feet: Secondary | ICD-10-CM | POA: Diagnosis not present

## 2023-04-06 DIAGNOSIS — M6281 Muscle weakness (generalized): Secondary | ICD-10-CM | POA: Diagnosis not present

## 2023-04-06 DIAGNOSIS — R29898 Other symptoms and signs involving the musculoskeletal system: Secondary | ICD-10-CM | POA: Diagnosis not present

## 2023-04-06 LAB — CBC AND DIFFERENTIAL
HCT: 28 — AB (ref 36–46)
Hemoglobin: 8.9 — AB (ref 12.0–16.0)
Neutrophils Absolute: 3354
Platelets: 282 10*3/uL (ref 150–400)
WBC: 6.5

## 2023-04-06 LAB — BASIC METABOLIC PANEL
BUN: 25 — AB (ref 4–21)
CO2: 24 — AB (ref 13–22)
Chloride: 103 (ref 99–108)
Creatinine: 1.6 — AB (ref 0.5–1.1)
Glucose: 78
Potassium: 4.2 meq/L (ref 3.5–5.1)
Sodium: 139 (ref 137–147)

## 2023-04-06 LAB — CBC: RBC: 2.78 — AB (ref 3.87–5.11)

## 2023-04-06 LAB — COMPREHENSIVE METABOLIC PANEL
Calcium: 9.3 (ref 8.7–10.7)
eGFR: 30

## 2023-04-07 DIAGNOSIS — R296 Repeated falls: Secondary | ICD-10-CM | POA: Diagnosis not present

## 2023-04-07 DIAGNOSIS — R41841 Cognitive communication deficit: Secondary | ICD-10-CM | POA: Diagnosis not present

## 2023-04-07 DIAGNOSIS — M6281 Muscle weakness (generalized): Secondary | ICD-10-CM | POA: Diagnosis not present

## 2023-04-07 DIAGNOSIS — R2681 Unsteadiness on feet: Secondary | ICD-10-CM | POA: Diagnosis not present

## 2023-04-07 DIAGNOSIS — R29898 Other symptoms and signs involving the musculoskeletal system: Secondary | ICD-10-CM | POA: Diagnosis not present

## 2023-04-08 DIAGNOSIS — R2681 Unsteadiness on feet: Secondary | ICD-10-CM | POA: Diagnosis not present

## 2023-04-08 DIAGNOSIS — R296 Repeated falls: Secondary | ICD-10-CM | POA: Diagnosis not present

## 2023-04-08 DIAGNOSIS — M6281 Muscle weakness (generalized): Secondary | ICD-10-CM | POA: Diagnosis not present

## 2023-04-08 DIAGNOSIS — R41841 Cognitive communication deficit: Secondary | ICD-10-CM | POA: Diagnosis not present

## 2023-04-08 DIAGNOSIS — R29898 Other symptoms and signs involving the musculoskeletal system: Secondary | ICD-10-CM | POA: Diagnosis not present

## 2023-04-10 DIAGNOSIS — R41841 Cognitive communication deficit: Secondary | ICD-10-CM | POA: Diagnosis not present

## 2023-04-10 DIAGNOSIS — R296 Repeated falls: Secondary | ICD-10-CM | POA: Diagnosis not present

## 2023-04-10 DIAGNOSIS — M6281 Muscle weakness (generalized): Secondary | ICD-10-CM | POA: Diagnosis not present

## 2023-04-10 DIAGNOSIS — R29898 Other symptoms and signs involving the musculoskeletal system: Secondary | ICD-10-CM | POA: Diagnosis not present

## 2023-04-10 DIAGNOSIS — R2681 Unsteadiness on feet: Secondary | ICD-10-CM | POA: Diagnosis not present

## 2023-04-11 DIAGNOSIS — R296 Repeated falls: Secondary | ICD-10-CM | POA: Diagnosis not present

## 2023-04-11 DIAGNOSIS — M6281 Muscle weakness (generalized): Secondary | ICD-10-CM | POA: Diagnosis not present

## 2023-04-11 DIAGNOSIS — R41841 Cognitive communication deficit: Secondary | ICD-10-CM | POA: Diagnosis not present

## 2023-04-11 DIAGNOSIS — R2681 Unsteadiness on feet: Secondary | ICD-10-CM | POA: Diagnosis not present

## 2023-04-11 DIAGNOSIS — R29898 Other symptoms and signs involving the musculoskeletal system: Secondary | ICD-10-CM | POA: Diagnosis not present

## 2023-04-12 ENCOUNTER — Encounter: Payer: Self-pay | Admitting: Nurse Practitioner

## 2023-04-12 ENCOUNTER — Non-Acute Institutional Stay: Payer: Medicare Other | Admitting: Nurse Practitioner

## 2023-04-12 DIAGNOSIS — Z0000 Encounter for general adult medical examination without abnormal findings: Secondary | ICD-10-CM | POA: Diagnosis not present

## 2023-04-12 NOTE — Progress Notes (Unsigned)
Subjective:   Peggy [name] is a 89 y.o. female who presents for Medicare Annual (Subsequent) preventive examination.  Visit Complete: In person  Patient Medicare AWV questionnaire was completed by the patient on 04/02/23; I have confirmed that all information answered by patient is correct and no changes since this date.        Objective:    Today's Vitals   04/12/23 1114  BP: (!) 140/80  Pulse: (!) 53  Resp: 18  Temp: (!) 97.5 F (36.4 C)  SpO2: 96%  Weight: 118 lb 12.8 oz (53.9 kg)  Height: 5\' 6"  (1.676 m)   Body mass index is 19.17 kg/m.     04/12/2023   11:20 AM 03/18/2023   10:58 AM 03/14/2023    1:43 PM 03/03/2023   11:27 AM 02/01/2023    2:48 PM 01/16/2023    3:00 PM 01/16/2023   11:05 AM  Advanced Directives  Does Patient Have a Medical Advance Directive? Yes Yes Yes Yes Yes Yes No  Type of Estate agent of [place];Out of facility DNR (pink MOST or yellow form) Out of facility DNR (pink MOST or yellow form);Healthcare Power of [employer] of [place];Out of facility DNR (pink MOST or yellow form) Out of facility DNR (pink MOST or yellow form);Healthcare Power of [employer] of [employer];Out of facility DNR (pink MOST or yellow form) Healthcare Power of [place];Living will   Does patient want to make changes to medical advance directive? No - Patient declined No - Patient declined No - Patient declined No - Patient declined No - Patient declined No - Patient declined   Copy of Healthcare Power of Attorney in Chart? Yes - validated most recent copy scanned in chart (See row information) Yes - validated most recent copy scanned in chart (See row information) Yes - validated most recent copy scanned in chart (See row information) Yes - validated most recent copy scanned in chart (See row information) Yes - validated most recent copy scanned in chart (See row information)    Would patient like information on  creating a medical advance directive?       No - Patient declined  Pre-existing out of facility DNR order (yellow form or pink MOST form) Yellow form placed in chart (order not valid for inpatient use) Yellow form placed in chart (order not valid for inpatient use);Pink MOST form placed in chart (order not valid for inpatient use)  Yellow form placed in chart (order not valid for inpatient use);Pink MOST form placed in chart (order not valid for inpatient use) Pink MOST form placed in chart (order not valid for inpatient use);Yellow form placed in chart (order not valid for inpatient use)      Current Medications (verified) Outpatient Encounter Medications as of 04/12/2023  Medication Sig   acetaminophen (TYLENOL) 325 MG tablet Take 2 tablets (650 mg total) by mouth every 6 (six) hours as needed for mild pain, fever or headache.   aspirin EC 81 MG tablet Take 81 mg by mouth daily. Monday, Wednesday and Friday   atorvastatin (LIPITOR) 20 MG tablet Take 4 tablets (80 mg total) by mouth daily.   bacitracin 500 UNIT/GM ointment Apply 1 Application topically daily. Apply to Lower leg wounds topically one time a day for healed bullous pemphigoid areas   camphor-menthol (SARNA) lotion Apply 1 Application topically in the morning and at bedtime.   carvedilol (COREG) 3.125 MG tablet Take 1 tablet (3.125  mg total) by mouth 2 (two) times daily with a meal.   cholecalciferol (VITAMIN D3) 25 MCG (1000 UNIT) tablet Take 1,000 Units by mouth every Monday, Wednesday, and Friday.   ferrous sulfate (FEROSUL) 325 (65 FE) MG tablet Take 1 tablet (325 mg total) by mouth daily with breakfast. MON, WED, FRI   furosemide (LASIX) 20 MG tablet Take 1 tablet (20 mg total) by mouth daily as needed for edema or fluid.   guaiFENesin-dextromethorphan (ROBITUSSIN DM) 100-10 MG/5ML syrup Take 10 mLs by mouth every 6 (six) hours as needed for cough.   losartan (COZAAR) 25 MG tablet Take 1 tablet (25 mg total) by mouth daily.    melatonin 3 MG TABS tablet Take 1 tablet (3 mg total) by mouth at bedtime.   Multiple Vitamins-Minerals (ICAPS AREDS 2 PO) Take 1 capsule by mouth daily with breakfast.   MYRBETRIQ 25 MG TB24 tablet TAKE 2 TABLETS BY MOUTH ONCE  DAILY   pantoprazole (PROTONIX) 40 MG tablet Take 1 tablet (40 mg total) by mouth daily.   potassium chloride SA (KLOR-CON M) 20 MEQ tablet Take 1 tablet (20 mEq total) by mouth daily.   senna-docusate (SENOKOT-S) 8.6-50 MG tablet Take 1 tablet by mouth at bedtime as needed for mild constipation.   spironolactone (ALDACTONE) 25 MG tablet Take 0.5 tablets (12.5 mg total) by mouth daily.   vitamin C (ASCORBIC ACID) 250 MG tablet Take 1 tablet (250 mg total) by mouth daily.   carbamide peroxide (DEBROX) 6.5 % OTIC solution Place 5 drops into both ears 4 (four) times daily. (Patient not taking: Reported on 04/12/2023)   No facility-administered encounter medications on file as of 04/12/2023.    Allergies (verified) Penicillins, Ace inhibitors, and Flagyl [metronidazole]   History: Past Medical History:  Diagnosis Date   Anemia    Chronic kidney disease    stage 3   GERD (gastroesophageal reflux disease)    Hypertension    Past Surgical History:  Procedure Laterality Date   ORIF ANKLE FRACTURE Right 01/13/2021   Procedure: OPEN TREATMENT OF RIGHT TRIMALLEOLAR ANKLE FRACTURE WITHOUT POSTERIOR FIXATION, POSSIBLE SYNDESMOSIS;  Surgeon: Terance Hart, MD;  Location: [facility] OR;  Service: Orthopedics;  Laterality: Right;   TOTAL HIP ARTHROPLASTY Right 12/02/2021   Procedure: TOTAL HIP ARTHROPLASTY;  Surgeon: Joen Laura, MD;  Location: [facility] ORS;  Service: Orthopedics;  Laterality: Right;   Family History  Problem Relation Age of Onset   Heart failure Mother    Heart disease Father    Arthritis Sister    Social History   Socioeconomic History   Marital status: Widowed    Spouse name: Adela Lank   Number of children: 2   Years of education: Not on file    Highest education level: Not on file  Occupational History   Occupation: teacher    Comment: retired  Tobacco Use   Smoking status: Former    Current packs/day: 0.00    Types: Cigarettes    Start date: 06/14/1945    Quit date: 06/14/1957    Years since quitting: 65.8   Smokeless tobacco: Never  Vaping Use   Vaping status: Never Used  Substance and Sexual Activity   Alcohol use: Not Currently   Drug use: No   Sexual activity: Not on file  Other Topics Concern   Not on file  Social History Narrative   Social History     Socioeconomic History       Marital status: Married   09/01/1954  Spouse name: Adela Lank   Two people stay in the home on the fourth floor       Number of children: 2       Years of education:       Highest education level: Not on file    Exercise: yoga, walking     Social Needs       Financial resource strain: Not on file       Food insecurity - worry: Not on file       Food insecurity - inability: Not on file       Transportation needs - medical: Not on file       Transportation needs - non-medical: Not on file     Occupational History       Not on file     Tobacco Use       Smoking status: Never Smoker       Smokeless tobacco: Never Used     Substance and Sexual Activity       Alcohol use: No       Drug use: No       Sexual activity: Not on file     Other Topics       Concerns:         Not on file     Social History Narrative       Not on file   Social Determinants of Health   Financial Resource Strain: Low Risk  (10/20/2017)   Overall Financial Resource Strain (CARDIA)    Difficulty of Paying Living Expenses: Not hard at all  Food Insecurity: No Food Insecurity (01/16/2023)   Hunger Vital Sign    Worried About Running Out of Food in the Last Year: Never true    Ran Out of Food in the Last Year: Never true  Transportation Needs: No Transportation Needs (01/16/2023)   PRAPARE - Administrator, Civil Service (Medical): No    Lack of  Transportation (Non-Medical): No  Physical Activity: Inactive (10/20/2017)   Exercise Vital Sign    Days of Exercise per Week: 0 days    Minutes of Exercise per Session: 0 min  Stress: No Stress Concern Present (10/20/2017)   [employer] of Occupational Health - Occupational Stress Questionnaire    Feeling of Stress : Only a little  Social Connections: Somewhat Isolated (10/20/2017)   Social Connection and Isolation Panel [NHANES]    Frequency of Communication with Friends and Family: More than three times a week    Frequency of Social Gatherings with Friends and Family: More than three times a week    Attends Religious Services: Never    Database administrator or Organizations: No    Attends Engineer, structural: Never    Marital Status: Married    Tobacco Counseling Counseling given: Not Answered   Clinical Intake:  Pre-visit preparation completed: Yes  Pain : No/denies pain     BMI - recorded: 19.17 Nutritional Status: BMI of 19-24  Normal Nutritional Risks: None Diabetes: No  How often do you need to have someone help you when you read instructions, pamphlets, or other written materials from your doctor or pharmacy?: 4 - Often What is the last grade level you completed in school?: college  Interpreter Needed?: No  Information entered by :: Daisy Lites Nedra Hai NP   Activities of Daily Living    04/12/2023    4:46 PM 01/16/2023    3:00 PM  In your present  state of health, do you have any difficulty performing the following activities:  Hearing? 0 1  Vision? 0 1  Difficulty concentrating or making decisions? 1 1  Walking or climbing stairs? 1 1  Dressing or bathing? 1 0  Doing errands, shopping? 1 1  Preparing Food and eating ? N   Using the Toilet? N   In the past six months, have you accidently leaked urine? Y   Do you have problems with loss of bowel control? N   Managing your Medications? Y   Managing your Finances? Y   Housekeeping or managing your  Housekeeping? Y     Patient Care Team: Banks Chaikin X, NP as PCP - General (Internal Medicine) [name], MD as PCP - Cardiology (Cardiology)  Indicate any recent Medical Services you may have received from other than [facility] providers in the past year (date may be approximate).     Assessment:   This is a routine wellness examination for Amii.  Hearing/Vision screen No results found.   Goals Addressed             This Visit's Progress    Maintain Mobility and Function       Evidence-based guidance:  Acknowledge and validate impact of pain, loss of strength and potential disfigurement (hand osteoarthritis) on mental health and daily life, such as social isolation, anxiety, depression, impaired sexual relationship and   injury from falls.  Anticipate referral to physical or occupational therapy for assessment, therapeutic exercise and recommendation for adaptive equipment or assistive devices; encourage participation.  Assess impact on ability to perform activities of daily living, as well as engage in sports and leisure events or requirements of work or school.  Provide anticipatory guidance and reassurance about the benefit of exercise to maintain function; acknowledge and normalize fear that exercise may worsen symptoms.  Encourage regular exercise, at least 10 minutes at a time for 45 minutes per week; consider yoga, water exercise and proprioceptive exercises; encourage use of wearable activity tracker to increase motivation and adherence.  Encourage maintenance or resumption of daily activities, including employment, as pain allows and with minimal exposure to trauma.  Assist patient to advocate for adaptations to the work environment.  Consider level of pain and function, gender, age, lifestyle, patient preference, quality of life, readiness and ?ocapacity to benefit? when recommending patients for orthopaedic surgery consultation.  Explore strategies, such as  changes to medication regimen or activity that enables patient to anticipate and manage flare-ups that increase deconditioning and disability.  Explore patient preferences; encourage exposure to a broader range of activities that have been avoided for fear of experiencing pain.  Identify barriers to participation in therapy or exercise, such as pain with activity, anticipated or imagined pain.  Monitor postoperative joint replacement or any preexisting joint replacement for ongoing pain and loss of function; provide social support and encouragement throughout recovery.   Notes:        Depression Screen    04/12/2023    4:48 PM 09/23/2022    3:00 PM 09/08/2022    2:58 PM 06/17/2022    1:45 PM 08/13/2021    2:22 PM 06/18/2021    1:55 PM 04/16/2021    3:03 PM  PHQ 2/9 Scores  PHQ - 2 Score 0 0 0 0 0 0 0    Fall Risk    11/05/2022    1:51 PM 09/23/2022    3:00 PM 09/08/2022    2:58 PM 07/01/2022   11:07 AM 06/17/2022  1:45 PM  Fall Risk   Falls in the past year? 0 0 0 0 0  Number falls in past yr: 0 0 0 0 0  Injury with Fall? 0 0 0 0 0  Risk for fall due to : No Fall Risks No Fall Risks No Fall Risks No Fall Risks No Fall Risks  Follow up Falls evaluation completed Falls evaluation completed Falls evaluation completed Falls evaluation completed Falls evaluation completed    MEDICARE RISK AT HOME: Medicare Risk at Home Any stairs in or around the home?: Yes If so, are there any without handrails?: No Home free of loose throw rugs in walkways, pet beds, electrical cords, etc?: Yes Adequate lighting in your home to reduce risk of falls?: Yes Life alert?: No Use of a cane, walker or w/c?: Yes Grab bars in the bathroom?: Yes Shower chair or bench in shower?: Yes Elevated toilet seat or a handicapped toilet?: Yes  TIMED UP AND GO:  Was the test performed?  Yes  Length of time to ambulate 10 feet: 8 sec Gait slow and steady with assistive device    Cognitive Function:    07/01/2022     2:48 PM 08/13/2021    2:46 PM 08/13/2021    2:34 PM 10/20/2017    2:48 PM  MMSE - Mini Mental State Exam  Orientation to time 3 2 2 4   Orientation to Place 4 5 5 5   Registration 3 3 3 3   Attention/ Calculation 5 5 5 5   Recall 3 2 2 2   Language- name 2 objects 2 2 2 2   Language- repeat 1 1 1 1   Language- follow 3 step command 3 3 3 3   Language- read & follow direction 1 1 1 1   Write a sentence 1 1 1 1   Copy design 1 1 1 1   Total score 27 26 26 28         Immunizations Immunization History  Administered Date(s) Administered   Fluad Quad(high Dose 65+) 03/28/2019   H1N1 03/18/2009   Influenza, High Dose Seasonal PF 03/25/2017, 03/16/2018, 03/26/2020, 03/14/2021, 03/30/2021, 03/14/2022, 03/30/2022   Influenza,inj,quad, With Preservative 03/26/2020, 03/30/2021   Influenza-Unspecified 02/25/2011, 03/31/2012, 03/27/2013, 03/14/2014, 03/25/2016, 06/14/2017, 03/28/2019, 03/16/2023   Moderna Covid-19 Vaccine Bivalent Booster 62yrs & up 03/30/2023   Moderna SARS-COV2 Booster Vaccination 04/22/2020, 10/28/2020, 03/30/2023   Moderna Sars-Covid-2 Vaccination 06/18/2019, 07/16/2019   PFIZER(Purple Top)SARS-COV-2 Vaccination 03/03/2021   PPD Test 12/25/2020, 01/05/2021, 12/17/2021   Pfizer Covid-19 Vaccine Bivalent Booster 60yrs & up 03/03/2021   Pneumococcal Conjugate-13 09/10/2014   Pneumococcal Polysaccharide-23 11/02/2004, 11/03/2014   Pneumococcal-Unspecified 06/14/2017   Rsv, Bivalent, Protein Subunit Rsvpref,pf Verdis Frederickson) 06/11/2022   Td (Adult),unspecified 12/02/2005   Tdap 11/27/2019   Unspecified SARS-COV-2 Vaccination 04/22/2020, 10/28/2020   Zoster Recombinant(Shingrix) 08/06/2020, 08/27/2022, 08/27/2022    TDAP status: Up to date  Flu Vaccine status: Up to date  Pneumococcal vaccine status: Up to date  Covid-19 vaccine status: Completed vaccines  Qualifies for Shingles Vaccine? Yes   Zostavax completed {YES/NO:21197}  {Shingrix Completed?:2101804}  Screening  Tests Health Maintenance  Topic Date Due   COVID-19 Vaccine (7 - 2023-24 season) 05/25/2023   Medicare Annual Wellness (AWV)  04/11/2024   DTaP/Tdap/Td (2 - Td or Tdap) 11/26/2029   Pneumonia Vaccine 6+ Years old  Completed   INFLUENZA VACCINE  Completed   DEXA SCAN  Completed   Zoster Vaccines- Shingrix  Completed   HPV VACCINES  Aged Out    Health Maintenance  There are no  preventive care reminders to display for this patient.   Colorectal cancer screening: No longer required.   Mammogram status: No longer required due to aged out.  Bone Density status: Completed 11/05/20. Results reflect: Bone density results: OSTEOPOROSIS. Repeat every 2 years.  Lung Cancer Screening: (Low Dose CT Chest recommended if Age 65-80 years, 20 pack-year currently smoking OR have quit w/in 15years.) does not qualify.   Lung Cancer Screening Referral: NA  Additional Screening:  Hepatitis C Screening: does not qualify;   Vision Screening: Recommended annual ophthalmology exams for early detection of glaucoma and other disorders of the eye. Is the patient up to date with their annual eye exam?  No  Who is the provider or what is the name of the office in which the patient attends annual eye exams? [facility] will provide If pt is not established with a provider, would they like to be referred to a provider to establish care? No .   Dental Screening: Recommended annual dental exams for proper oral hygiene  Diabetic Foot Exam: NA  Community Resource Referral / Chronic Care Management: CRR required this visit?  No   CCM required this visit?  No     Plan:     I have personally reviewed and noted the following in the patient's chart:   Medical and social history Use of alcohol, tobacco or illicit drugs  Current medications and supplements including opioid prescriptions. Patient is not currently taking opioid prescriptions. Functional ability and status Nutritional status Physical  activity Advanced directives List of other physicians Hospitalizations, surgeries, and ER visits in previous 12 months Vitals Screenings to include cognitive, depression, and falls Referrals and appointments  In addition, I have reviewed and discussed with patient certain preventive protocols, quality metrics, and best practice recommendations. A written personalized care plan for preventive services as well as general preventive health recommendations were provided to patient.     Rizwan Kuyper X Milton Sagona, NP   04/12/2023   After Visit Summary: (In Person-Declined) Patient declined AVS at this time.

## 2023-04-13 ENCOUNTER — Other Ambulatory Visit: Payer: Self-pay | Admitting: Sports Medicine

## 2023-04-13 DIAGNOSIS — R29898 Other symptoms and signs involving the musculoskeletal system: Secondary | ICD-10-CM | POA: Diagnosis not present

## 2023-04-13 DIAGNOSIS — E559 Vitamin D deficiency, unspecified: Secondary | ICD-10-CM

## 2023-04-13 DIAGNOSIS — R2681 Unsteadiness on feet: Secondary | ICD-10-CM | POA: Diagnosis not present

## 2023-04-13 DIAGNOSIS — R41841 Cognitive communication deficit: Secondary | ICD-10-CM | POA: Diagnosis not present

## 2023-04-13 DIAGNOSIS — R296 Repeated falls: Secondary | ICD-10-CM | POA: Diagnosis not present

## 2023-04-13 DIAGNOSIS — M6281 Muscle weakness (generalized): Secondary | ICD-10-CM | POA: Diagnosis not present

## 2023-04-14 DIAGNOSIS — R29898 Other symptoms and signs involving the musculoskeletal system: Secondary | ICD-10-CM | POA: Diagnosis not present

## 2023-04-14 DIAGNOSIS — R2681 Unsteadiness on feet: Secondary | ICD-10-CM | POA: Diagnosis not present

## 2023-04-14 DIAGNOSIS — R41841 Cognitive communication deficit: Secondary | ICD-10-CM | POA: Diagnosis not present

## 2023-04-14 DIAGNOSIS — R296 Repeated falls: Secondary | ICD-10-CM | POA: Diagnosis not present

## 2023-04-14 DIAGNOSIS — M6281 Muscle weakness (generalized): Secondary | ICD-10-CM | POA: Diagnosis not present

## 2023-04-15 DIAGNOSIS — R2681 Unsteadiness on feet: Secondary | ICD-10-CM | POA: Diagnosis not present

## 2023-04-15 DIAGNOSIS — R29898 Other symptoms and signs involving the musculoskeletal system: Secondary | ICD-10-CM | POA: Diagnosis not present

## 2023-04-15 DIAGNOSIS — M6281 Muscle weakness (generalized): Secondary | ICD-10-CM | POA: Diagnosis not present

## 2023-04-15 DIAGNOSIS — R296 Repeated falls: Secondary | ICD-10-CM | POA: Diagnosis not present

## 2023-04-18 DIAGNOSIS — R2681 Unsteadiness on feet: Secondary | ICD-10-CM | POA: Diagnosis not present

## 2023-04-18 DIAGNOSIS — R296 Repeated falls: Secondary | ICD-10-CM | POA: Diagnosis not present

## 2023-04-18 DIAGNOSIS — R29898 Other symptoms and signs involving the musculoskeletal system: Secondary | ICD-10-CM | POA: Diagnosis not present

## 2023-04-18 DIAGNOSIS — M6281 Muscle weakness (generalized): Secondary | ICD-10-CM | POA: Diagnosis not present

## 2023-04-19 DIAGNOSIS — R29898 Other symptoms and signs involving the musculoskeletal system: Secondary | ICD-10-CM | POA: Diagnosis not present

## 2023-04-19 DIAGNOSIS — M6281 Muscle weakness (generalized): Secondary | ICD-10-CM | POA: Diagnosis not present

## 2023-04-19 DIAGNOSIS — R2681 Unsteadiness on feet: Secondary | ICD-10-CM | POA: Diagnosis not present

## 2023-04-19 DIAGNOSIS — R296 Repeated falls: Secondary | ICD-10-CM | POA: Diagnosis not present

## 2023-04-22 DIAGNOSIS — R2681 Unsteadiness on feet: Secondary | ICD-10-CM | POA: Diagnosis not present

## 2023-04-22 DIAGNOSIS — R296 Repeated falls: Secondary | ICD-10-CM | POA: Diagnosis not present

## 2023-04-22 DIAGNOSIS — R29898 Other symptoms and signs involving the musculoskeletal system: Secondary | ICD-10-CM | POA: Diagnosis not present

## 2023-04-22 DIAGNOSIS — M6281 Muscle weakness (generalized): Secondary | ICD-10-CM | POA: Diagnosis not present

## 2023-05-02 ENCOUNTER — Encounter (HOSPITAL_COMMUNITY)
Admission: RE | Admit: 2023-05-02 | Discharge: 2023-05-02 | Disposition: A | Discharge status: Home / Self Care | Payer: Medicare Other | Source: Ambulatory Visit | Attending: Nephrology | Admitting: Nephrology

## 2023-05-02 VITALS — BP 130/77 | HR 65 | Temp 97.1°F | Resp 18

## 2023-05-02 DIAGNOSIS — N184 Chronic kidney disease, stage 4 (severe): Secondary | ICD-10-CM | POA: Diagnosis not present

## 2023-05-02 LAB — POCT HEMOGLOBIN-HEMACUE: Hemoglobin: 9.5 g/dL — ABNORMAL LOW (ref 12.0–15.0)

## 2023-05-02 MED ORDER — EPOETIN ALFA-EPBX 10000 UNIT/ML IJ SOLN
INTRAMUSCULAR | Status: AC
  Filled 2023-05-02: qty 1

## 2023-05-02 MED ORDER — EPOETIN ALFA-EPBX 10000 UNIT/ML IJ SOLN
10000.0000 [IU] | INTRAMUSCULAR | Status: DC
  Administered 2023-05-02: 10000 [IU] via SUBCUTANEOUS

## 2023-05-16 ENCOUNTER — Ambulatory Visit (HOSPITAL_COMMUNITY)
Admission: RE | Admit: 2023-05-16 | Discharge: 2023-05-16 | Disposition: A | Discharge status: Home / Self Care | Payer: Medicare Other | Source: Ambulatory Visit | Attending: Nephrology | Admitting: Nephrology

## 2023-05-16 VITALS — BP 118/78 | HR 85 | Temp 97.6°F | Resp 17

## 2023-05-16 DIAGNOSIS — N184 Chronic kidney disease, stage 4 (severe): Secondary | ICD-10-CM | POA: Insufficient documentation

## 2023-05-16 LAB — POCT HEMOGLOBIN-HEMACUE: Hemoglobin: 10.8 g/dL — ABNORMAL LOW (ref 12.0–15.0)

## 2023-05-16 MED ORDER — EPOETIN ALFA-EPBX 10000 UNIT/ML IJ SOLN
INTRAMUSCULAR | Status: AC
  Administered 2023-05-16: 10000 [IU] via SUBCUTANEOUS
  Filled 2023-05-16: qty 1

## 2023-05-16 MED ORDER — EPOETIN ALFA-EPBX 10000 UNIT/ML IJ SOLN
10000.0000 [IU] | INTRAMUSCULAR | Status: DC
Start: 2023-05-16 — End: 2024-05-28

## 2023-05-26 ENCOUNTER — Encounter: Payer: Self-pay | Admitting: Nurse Practitioner

## 2023-05-26 ENCOUNTER — Non-Acute Institutional Stay: Payer: Medicare Other | Admitting: Nurse Practitioner

## 2023-05-26 DIAGNOSIS — I509 Heart failure, unspecified: Secondary | ICD-10-CM | POA: Diagnosis not present

## 2023-05-26 DIAGNOSIS — N3942 Incontinence without sensory awareness: Secondary | ICD-10-CM | POA: Diagnosis not present

## 2023-05-26 DIAGNOSIS — I498 Other specified cardiac arrhythmias: Secondary | ICD-10-CM

## 2023-05-26 DIAGNOSIS — K219 Gastro-esophageal reflux disease without esophagitis: Secondary | ICD-10-CM | POA: Diagnosis not present

## 2023-05-26 DIAGNOSIS — K5901 Slow transit constipation: Secondary | ICD-10-CM

## 2023-05-26 DIAGNOSIS — M150 Primary generalized (osteo)arthritis: Secondary | ICD-10-CM

## 2023-05-26 DIAGNOSIS — M8000XD Age-related osteoporosis with current pathological fracture, unspecified site, subsequent encounter for fracture with routine healing: Secondary | ICD-10-CM

## 2023-05-26 DIAGNOSIS — N184 Chronic kidney disease, stage 4 (severe): Secondary | ICD-10-CM | POA: Diagnosis not present

## 2023-05-26 DIAGNOSIS — D649 Anemia, unspecified: Secondary | ICD-10-CM

## 2023-05-26 DIAGNOSIS — L129 Pemphigoid, unspecified: Secondary | ICD-10-CM | POA: Diagnosis not present

## 2023-05-26 DIAGNOSIS — I10 Essential (primary) hypertension: Secondary | ICD-10-CM | POA: Diagnosis not present

## 2023-05-26 DIAGNOSIS — Z66 Do not resuscitate: Secondary | ICD-10-CM | POA: Diagnosis not present

## 2023-05-26 DIAGNOSIS — E785 Hyperlipidemia, unspecified: Secondary | ICD-10-CM | POA: Insufficient documentation

## 2023-05-26 NOTE — Assessment & Plan Note (Signed)
CHF/Edema BLE, chronic, minimal, on Aldactoneand Furosemide(increased to 40mg  03/01/23 per [facility]) now, 07/2021 venous US LLE negative DVT, prn Furosemide available. Followed by Cardiology. 01/2023 during hospitalization BNP 265, EF 40-45%, started on Carvedilol, Losartan, Aldactone, resumed Furosemide and off  Metolazone, Amlodipine.

## 2023-05-26 NOTE — Progress Notes (Signed)
Location:  [facility] Room Number: 826-A Place of Service:  [phone]) Provider:  Bentley Haralson X,NP  Naama Sappington X, NP  Patient Care Team: Christipher Rieger X, NP as PCP - General (Internal Medicine) [name], MD as PCP - Cardiology (Cardiology)  Extended Emergency Contact Information Primary Emergency Contact: Laursen,[name] Address: [address]          [place], [place] [address] [name] of [place] Home Phone: [phone] Work Phone: [phone] Mobile Phone: [phone] Relation: Daughter Secondary Emergency Contact: [name],[name] Home Phone: [phone] Relation: Daughter  Code Status:  DNR Goals of care: Advanced Directive information    05/26/2023   10:13 AM  Advanced Directives  Does Patient Have a Medical Advance Directive? Yes  Type of Estate agent of [place];Out of facility DNR (pink MOST or yellow form)  Does patient want to make changes to medical advance directive? No - Patient declined  Copy of Healthcare Power of Attorney in Chart? Yes - validated most recent copy scanned in chart (See row information)  Pre-existing out of facility DNR order (yellow form or pink MOST form) Yellow form placed in chart (order not valid for inpatient use)     Chief Complaint  Patient presents with   Medical Management of Chronic Issues    Routine visit     HPI:  Pt is a 89 y.o. female seen today for medical management of chronic diseases.                Hospitalized 01/16/23-01/19/23 s/p fall, left sided rib pain, CT head/cervical spine showed no acute intracranial process, negative Xray hips. 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              Emotional outburst, better  Cardiology: no evidence of Afib, probably wandering  atrial pacer, on Carvedilol, ASA             Hypokalemia, K 4.2 04/06/23 Pemphigoid, legs, responded steroid, on Doxy daily x 2 months course per dermatology, topical Bacitracin oint.  Anemia, Vit B12 2430, Folate >40 01/16/23,  EPO 9.9 09/15/21, On Fe,  chronic, on Pantoprazole for GI protection, FOBT negative. Hgb 10.8 05/16/23 GERD, stable, on Pantoprazole.  CHF/Edema BLE, chronic, minimal, on Aldactoneand Furosemide(increased to 40mg  03/01/23 per [facility]) now, 07/2021 venous US LLE negative DVT, prn Furosemide available. Followed by Cardiology. 01/2023 during hospitalization BNP 265, EF 40-45%, started on Carvedilol, Losartan, Aldactone, resumed Furosemide and off  Metolazone, Amlodipine.  CKD Bun/creat 25/1.6 04/06/23, saw nephrology, [facility]: increase Lasix to 40mg  every day  OA, Hx of R ankle fx, R hip total hip arthroplasty 11/30/21, ambulates with walker, risk of falling, last fall 05/23/23 w/o injury             Her urinary frequency/leakage, uses pads, it seems better since taking Myrbetriq in pm per Urology             HTN, blood pressure is controlled, off Amlodipine started 07/07/21 by Nephrology, now taking Losartan, Carvedilol, Furosemide, Spironolactone.             Osteoporosis, off Alendronate due to CKD, 11/05/20 DEXA t score -2.8, desires delaying DEXA             Fecal incontinence prn Imodium  Constipation, taking Senna  HLD, taking Atorvastatin.  Past Medical History:  Diagnosis Date   Anemia    Chronic kidney disease    stage 3   GERD (gastroesophageal reflux disease)    Hypertension    Past Surgical History:  Procedure Laterality Date   ORIF ANKLE FRACTURE Right 01/13/2021   Procedure: OPEN TREATMENT OF RIGHT TRIMALLEOLAR ANKLE FRACTURE WITHOUT POSTERIOR FIXATION, POSSIBLE SYNDESMOSIS;  Surgeon: Terance Hart, MD;  Location: [facility] OR;  Service: Orthopedics;  Laterality: Right;   TOTAL HIP ARTHROPLASTY Right 12/02/2021   Procedure: TOTAL HIP  ARTHROPLASTY;  Surgeon: Joen Laura, MD;  Location: [facility] ORS;  Service: Orthopedics;  Laterality: Right;    Allergies  Allergen Reactions   Penicillins Swelling and Other (See Comments)    Facial swelling; can tolerate cephalosporins    Ace Inhibitors Other (See Comments)    Reaction unknown   Flagyl [Metronidazole] Other (See Comments)    Reaction unknown    Outpatient Encounter Medications as of 05/26/2023  Medication Sig   acetaminophen (TYLENOL) 325 MG tablet Take 2 tablets (650 mg total) by mouth every 6 (six) hours as needed for mild pain, fever or headache.   aspirin EC 81 MG tablet Take 81 mg by mouth daily. Monday, Wednesday and Friday   atorvastatin (LIPITOR) 20 MG tablet Take 4 tablets (80 mg total) by mouth daily.   bacitracin 500 UNIT/GM ointment Apply 1 Application topically daily. Apply to Lower leg wounds topically one time a day for healed bullous pemphigoid areas   camphor-menthol (SARNA) lotion Apply 1 Application topically in the morning and at bedtime.   carvedilol (COREG) 3.125 MG tablet Take 1 tablet (3.125 mg total) by mouth 2 (two) times daily with a meal.   cholecalciferol (VITAMIN D3) 25 MCG (1000 UNIT) tablet Take 1,000 Units by mouth every Monday, Wednesday, and Friday.   ferrous sulfate (FEROSUL) 325 (65 FE) MG tablet Take 1 tablet (325 mg total) by mouth daily with breakfast. MON, WED, FRI   furosemide (LASIX) 20 MG tablet Take 1 tablet (20 mg total) by mouth daily as needed for edema or fluid.   guaiFENesin-dextromethorphan (ROBITUSSIN DM) 100-10 MG/5ML syrup Take 10 mLs by mouth every 6 (six) hours as needed for cough.   losartan (COZAAR) 25 MG tablet Take 1 tablet (25 mg total) by mouth daily.   melatonin 3 MG TABS tablet Take 1 tablet (3 mg total) by mouth at bedtime.   Multiple Vitamins-Minerals (ICAPS AREDS 2 PO) Take 1 capsule by mouth daily with breakfast.   MYRBETRIQ 25 MG TB24 tablet TAKE 2 TABLETS BY MOUTH ONCE  DAILY   pantoprazole  (PROTONIX) 40 MG tablet Take 1 tablet (40 mg total) by mouth daily.   potassium chloride SA (KLOR-CON M) 20 MEQ tablet Take 1 tablet (20 mEq total) by mouth daily.   senna-docusate (SENOKOT-S) 8.6-50 MG tablet Take 1 tablet by mouth at bedtime as needed for mild constipation.   spironolactone (ALDACTONE) 25 MG tablet Take 0.5 tablets (12.5 mg total) by mouth daily.   vitamin C (ASCORBIC ACID) 250 MG tablet Take 1 tablet (250 mg total) by mouth daily.   carbamide peroxide (DEBROX) 6.5 % OTIC solution Place 5 drops into both ears 4 (four) times daily. (Patient not taking: Reported on 05/26/2023)   No facility-administered encounter medications on file as of 05/26/2023.    Review of Systems  Constitutional:  Negative for appetite change, fatigue and fever.  HENT:  Positive for hearing loss. Negative for congestion and sore throat.   Eyes:  Negative for visual disturbance.  Respiratory:  Positive for cough. Negative for shortness of breath.        Occasional cough, scant greenish phlegm production.   Cardiovascular:  Positive for leg swelling.  Gastrointestinal:  Negative for abdominal pain and constipation.       Fecal incontinent.   Genitourinary:  Positive for frequency. Negative for dysuria and urgency.       Occasionally incontinent of urine. Urination average every 4 hrs during day 1x/night.   Musculoskeletal:  Positive for arthralgias and gait problem.       Right lower leg/ankle brace  Skin:  Positive for wound.  Neurological:  Negative for speech difficulty, weakness and light-headedness.       Memory lapses.   Psychiatric/Behavioral:  Negative for behavioral problems and sleep disturbance. The patient is not nervous/anxious.     Immunization History  Administered Date(s) Administered   Fluad Quad(high Dose 65+) 03/28/2019   H1N1 03/18/2009   Influenza, High Dose Seasonal PF 03/25/2017, 03/16/2018, 03/26/2020, 03/14/2021, 03/30/2021, 03/14/2022, 03/30/2022   Influenza,inj,quad,  With Preservative 03/26/2020, 03/30/2021   Influenza-Unspecified 02/25/2011, 03/31/2012, 03/27/2013, 03/14/2014, 03/25/2016, 06/14/2017, 03/28/2019, 03/16/2023   Moderna Covid-19 Vaccine Bivalent Booster 62yrs & up 03/30/2023   Moderna SARS-COV2 Booster Vaccination 04/22/2020, 10/28/2020, 03/30/2023   Moderna Sars-Covid-2 Vaccination 06/18/2019, 07/16/2019   PFIZER(Purple Top)SARS-COV-2 Vaccination 03/03/2021   PPD Test 12/25/2020, 01/05/2021, 12/17/2021   Pfizer Covid-19 Vaccine Bivalent Booster 62yrs & up 03/03/2021   Pneumococcal Conjugate-13 09/10/2014   Pneumococcal Polysaccharide-23 11/02/2004, 11/03/2014   Pneumococcal-Unspecified 06/14/2017   Rsv, Bivalent, Protein Subunit Rsvpref,pf Verdis Frederickson) 06/11/2022   Td (Adult),unspecified 12/02/2005   Tdap 11/27/2019   Unspecified SARS-COV-2 Vaccination 04/22/2020, 10/28/2020   Zoster Recombinant(Shingrix) 08/06/2020, 08/27/2022, 08/27/2022   Pertinent  Health Maintenance Due  Topic Date Due   INFLUENZA VACCINE  Completed   DEXA SCAN  Completed      06/17/2022    1:45 PM 07/01/2022   11:07 AM 09/08/2022    2:58 PM 09/23/2022    3:00 PM 11/05/2022    1:51 PM  Fall Risk  Falls in the past year? 0 0 0 0 0  Was there an injury with Fall? 0 0 0 0 0  Fall Risk Category Calculator 0 0 0 0 0  Fall Risk Category (Retired) Low      (RETIRED) Patient Fall Risk Level Low fall risk      Patient at Risk for Falls Due to No Fall Risks No Fall Risks No Fall Risks No Fall Risks No Fall Risks  Fall risk Follow up Falls evaluation completed Falls evaluation completed Falls evaluation completed Falls evaluation completed Falls evaluation completed   Functional Status Survey:    Vitals:   05/26/23 1011  BP: 130/60  Pulse: 80  Resp: 16  Temp: (!) 97.1 F (36.2 C)  SpO2: 96%  Weight: 118 lb (53.5 kg)  Height: 5\' 6"  (1.676 m)   Body mass index is 19.05 kg/m. Physical Exam Vitals reviewed.  Constitutional:      Appearance: Normal appearance.   HENT:     Head: Normocephalic and atraumatic.     Nose: Nose normal.     Mouth/Throat:     Mouth: Mucous membranes are moist.  Eyes:     Extraocular Movements: Extraocular movements intact.     Conjunctiva/sclera: Conjunctivae normal.     Pupils: Pupils are equal, round, and reactive to light.  Cardiovascular:     Rate and Rhythm: Normal rate and regular rhythm.  Heart sounds: No murmur heard. Pulmonary:     Effort: Pulmonary effort is normal.     Breath sounds: Rales present.     Comments: Bibasilar rales.  Abdominal:     General: Bowel sounds are normal.     Palpations: Abdomen is soft.     Tenderness: There is no abdominal tenderness.  Musculoskeletal:        General: No tenderness.     Cervical back: Normal range of motion and neck supple.     Right lower leg: Edema present.     Left lower leg: Edema present.     Comments: R ankle s/p ORIF 01/13/21. THR 11/2021. Edema trace to 1+ LLE, 1-2+ RLE  Skin:    General: Skin is warm and dry.     Findings: Erythema present.     Comments: Chronic pigmented venous insufficiency skin changes BLE. Opened bullae 2 x RLE. no s/s of infection presently.   Neurological:     Mental Status: She is alert. Mental status is at baseline.     Gait: Gait abnormal.     Comments: Oriented to person, place.   Psychiatric:        Mood and Affect: Mood normal.        Behavior: Behavior normal.        Thought Content: Thought content normal.     Labs reviewed: Recent Labs    01/16/23 2032 01/17/23 0403 01/17/23 1645 01/18/23 0419 01/19/23 0933 01/22/23 0000 01/25/23 0000 04/06/23 0000  NA 130*   < > 134* 133* 129* 129* 132* 139  K <2.0*   < > 2.9* 4.1 2.9* 4.2 4.9 4.2  CL 87*   < > 95* 93* 92* 97* 99 103  CO2 30   < > 28 29 24 20  26* 24*  GLUCOSE 106*   < > 106* 111* 125*  --   --   --   BUN 36*   < > 35* 33* 30* 29*  --  25*  CREATININE 1.95*   < > 1.80* 2.00* 1.76* 1.5* 1.6* 1.6*  CALCIUM 9.1   < > 8.5* 9.0 8.9 9.1 9.2 9.3  MG  1.9  --   --  1.7  --   --   --   --   PHOS  --   --   --  2.3*  --   --   --   --    < > = values in this interval not displayed.   Recent Labs    01/16/23 1125 01/17/23 0403 01/25/23 0000  AST 30 31 16   ALT 13 17 13   ALKPHOS 65 54 73  BILITOT 0.5 0.6  --   PROT 6.7 5.5*  --   ALBUMIN 4.0 3.0* 3.4*   Recent Labs    01/16/23 1125 01/17/23 0403 01/18/23 0419 01/25/23 0000 04/06/23 0000 05/02/23 1018 05/16/23 1001  WBC 8.0 6.5 7.3 9.1 6.5  --   --   NEUTROABS 4.9  --   --  6,270.00 3,354.00  --   --   HGB 10.9* 9.8* 9.9* 9.5* 8.9* 9.5* 10.8*  HCT 31.8* 30.2* 30.4* 28* 28*  --   --   MCV 93.8 98.7 98.4  --   --   --   --   PLT 385 318 318 268 282  --   --    Lab Results  Component Value Date   TSH 0.862 01/16/2023   No results found for: "HGBA1C" Lab Results  Component Value Date   CHOL 174 01/16/2023   HDL 40 (L) 01/16/2023   LDLCALC 120 (H) 01/16/2023   TRIG 69 01/16/2023   CHOLHDL 4.4 01/16/2023    Significant Diagnostic Results in last 30 days:  No results found.  Assessment/Plan GERD (gastroesophageal reflux disease)  stable, on Pantoprazole.   Congestive heart failure (CHF) (HCC) CHF/Edema BLE, chronic, minimal, on Aldactoneand Furosemide(increased to 40mg  03/01/23 per [facility]) now, 07/2021 venous US LLE negative DVT, prn Furosemide available. Followed by Cardiology. 01/2023 during hospitalization BNP 265, EF 40-45%, started on Carvedilol, Losartan, Aldactone, resumed Furosemide and off  Metolazone, Amlodipine.   CKD (chronic kidney disease) stage 4, GFR 15-29 ml/min (HCC) Bun/creat 25/1.6 04/06/23, saw nephrology, [facility]: increase Lasix to 40mg  every day   Osteoarthritis, multiple sites Hx of R ankle fx, R hip total hip arthroplasty 11/30/21, ambulates with walker, risk of falling, last fall 05/23/23 w/o injury  Incontinent of urine Her urinary frequency/leakage, uses pads, it seems better since taking Myrbetriq in pm per  Urology  Hypertension blood pressure is controlled, off Amlodipine started 07/07/21 by Nephrology, now taking Losartan, Carvedilol, Furosemide, Spironolactone.   Osteoporosis , off Alendronate due to CKD, 11/05/20 DEXA t score -2.8, desires delaying DEXA  Slow transit constipation Stable, taking Senna  Hyperlipidemia taking Atorvastatin.     Anemia  Vit B12 2430, Folate >40 01/16/23,  EPO 9.9 09/15/21, On Fe,  chronic, on Pantoprazole for GI protection, FOBT negative. Hgb 10.8 05/16/23  Pemphigoid Pemphigoid, legs, responded steroid, on Doxy daily x 2 months course per dermatology, topical Bacitracin oint.   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   Labs/tests ordered:  none  Time spend 40 minutes.

## 2023-05-26 NOTE — Assessment & Plan Note (Signed)
Pemphigoid, legs, responded steroid, on Doxy daily x 2 months course per dermatology, topical Bacitracin oint.

## 2023-05-26 NOTE — Assessment & Plan Note (Signed)
no evidence of Afib, probably wandering atrial pacer, on Carvedilol, ASA

## 2023-05-26 NOTE — Assessment & Plan Note (Signed)
,   off Alendronate due to CKD, 11/05/20 DEXA t score -2.8, desires delaying DEXA

## 2023-05-26 NOTE — Assessment & Plan Note (Signed)
stable, on Pantoprazole  

## 2023-05-26 NOTE — Assessment & Plan Note (Signed)
Hx of R ankle fx, R hip total hip arthroplasty 11/30/21, ambulates with walker, risk of falling, last fall 05/23/23 w/o injury

## 2023-05-26 NOTE — Assessment & Plan Note (Signed)
Her urinary frequency/leakage, uses pads, it seems better since taking Myrbetriq in pm per Urology ?

## 2023-05-26 NOTE — Assessment & Plan Note (Signed)
taking Atorvastatin.  

## 2023-05-26 NOTE — Assessment & Plan Note (Signed)
Stable, taking Senna

## 2023-05-26 NOTE — Assessment & Plan Note (Signed)
blood pressure is controlled, off Amlodipine started 07/07/21 by Nephrology, now taking Losartan, Carvedilol, Furosemide, Spironolactone.

## 2023-05-26 NOTE — Assessment & Plan Note (Signed)
Bun/creat 25/1.6 04/06/23, saw nephrology, [facility]: increase Lasix to 40mg  every day

## 2023-05-26 NOTE — Assessment & Plan Note (Signed)
Vit B12 2430, Folate >40 01/16/23,  EPO 9.9 09/15/21, On Fe,  chronic, on Pantoprazole for GI protection, FOBT negative. Hgb 10.8 05/16/23

## 2023-05-30 ENCOUNTER — Encounter: Payer: Self-pay | Admitting: Nurse Practitioner

## 2023-05-30 ENCOUNTER — Non-Acute Institutional Stay: Payer: Self-pay | Admitting: Nurse Practitioner

## 2023-05-30 ENCOUNTER — Inpatient Hospital Stay (HOSPITAL_COMMUNITY): Admission: RE | Admit: 2023-05-30 | Payer: Medicare Other | Source: Ambulatory Visit

## 2023-05-30 DIAGNOSIS — I509 Heart failure, unspecified: Secondary | ICD-10-CM | POA: Diagnosis not present

## 2023-05-30 DIAGNOSIS — K2900 Acute gastritis without bleeding: Secondary | ICD-10-CM

## 2023-05-30 DIAGNOSIS — D649 Anemia, unspecified: Secondary | ICD-10-CM

## 2023-05-30 DIAGNOSIS — N3942 Incontinence without sensory awareness: Secondary | ICD-10-CM

## 2023-05-30 DIAGNOSIS — N184 Chronic kidney disease, stage 4 (severe): Secondary | ICD-10-CM

## 2023-05-30 DIAGNOSIS — L129 Pemphigoid, unspecified: Secondary | ICD-10-CM

## 2023-05-30 DIAGNOSIS — K219 Gastro-esophageal reflux disease without esophagitis: Secondary | ICD-10-CM

## 2023-05-30 DIAGNOSIS — E876 Hypokalemia: Secondary | ICD-10-CM

## 2023-05-30 DIAGNOSIS — R269 Unspecified abnormalities of gait and mobility: Secondary | ICD-10-CM

## 2023-05-30 DIAGNOSIS — I10 Essential (primary) hypertension: Secondary | ICD-10-CM | POA: Diagnosis not present

## 2023-05-30 NOTE — Assessment & Plan Note (Signed)
 CHF/Edema BLE, chronic, minimal, on Aldactoneand Furosemide(increased to 40mg  03/01/23 per [facility]) now, 07/2021 venous US LLE negative DVT, prn Furosemide available. Followed by Cardiology. 01/2023 during hospitalization BNP 265, EF 40-45%, started on Carvedilol, Losartan, Aldactone, resumed Furosemide and off  Metolazone, Amlodipine.

## 2023-05-30 NOTE — Progress Notes (Signed)
Location:   [facility] Room Number: 826 Place of Service:  ALF (13) Provider: Arna Snipe Vardaan Depascale NP  Emine Lopata X, NP  Patient Care Team: Leata Dominy X, NP as PCP - General (Internal Medicine) [name], MD as PCP - Cardiology (Cardiology)  Extended Emergency Contact Information Primary Emergency Contact: Mccrae,[name] Address: [address]          [place], [place] [address] [name] of [place] Home Phone: [phone] Work Phone: [phone] Mobile Phone: [phone] Relation: Daughter Secondary Emergency Contact: [name],[name] Home Phone: [phone] Relation: Daughter  Code Status: DNR Goals of care: Advanced Directive information    05/26/2023   10:13 AM  Advanced Directives  Does Patient Have a Medical Advance Directive? Yes  Type of Estate agent of [place];Out of facility DNR (pink MOST or yellow form)  Does patient want to make changes to medical advance directive? No - Patient declined  Copy of Healthcare Power of Attorney in Chart? Yes - validated most recent copy scanned in chart (See row information)  Pre-existing out of facility DNR order (yellow form or pink MOST form) Yellow form placed in chart (order not valid for inpatient use)     Chief Complaint  Patient presents with   Acute Visit    Nausea, vomiting, diarrhea    HPI:  Pt is a 89 y.o. female seen today for an acute visit for nausea, vomiting, diarrhea x1 day, generalized weakness noted,  denied abd pain, the patient is afebrile.     Hospitalized 01/16/23-01/19/23 s/p fall, left sided rib pain, CT head/cervical spine showed no acute intracranial process, negative Xray hips. 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  Anemia, Vit B12 2430, Folate >40 01/16/23,  EPO 9.9 09/15/21, On Fe,  chronic, on Pantoprazole for GI protection, FOBT negative. Hgb 10.8 05/16/23 GERD, stable, on Pantoprazole.  CHF/Edema BLE, chronic, minimal, on Aldactoneand Furosemide(increased to 40mg  03/01/23 per [facility]) now, 07/2021 venous US LLE negative DVT, prn Furosemide available. Followed by Cardiology. 01/2023 during hospitalization BNP 265, EF 40-45%, started on Carvedilol, Losartan, Aldactone, resumed Furosemide and off  Metolazone, Amlodipine.  CKD Bun/creat 25/1.6 04/06/23, saw nephrology, [facility]: increase Lasix to 40mg  every day  OA, Hx of R ankle fx, R hip total hip arthroplasty 11/30/21, ambulates with walker, risk of falling, last fall 05/23/23 w/o injury             Her urinary frequency/leakage, uses pads, it seems better since taking Myrbetriq in pm per Urology             HTN, blood pressure is controlled, off Amlodipine started 07/07/21 by Nephrology, now taking Losartan, Carvedilol, Furosemide, Spironolactone.             Osteoporosis, off Alendronate due to CKD, 11/05/20 DEXA t score -2.8, desires delaying DEXA             Fecal incontinence  prn Imodium             Constipation, taking Senna             HLD, taking Atorvastatin.     Past Medical History:  Diagnosis Date   Anemia    Chronic kidney disease    stage 3   GERD (gastroesophageal reflux disease)    Hypertension    Past Surgical History:  Procedure Laterality Date   ORIF ANKLE FRACTURE Right 01/13/2021   Procedure: OPEN TREATMENT OF RIGHT TRIMALLEOLAR ANKLE FRACTURE WITHOUT POSTERIOR FIXATION, POSSIBLE SYNDESMOSIS;  Surgeon: Terance Hart, MD;  Location: [facility] OR;  Service: Orthopedics;  Laterality: Right;    TOTAL HIP ARTHROPLASTY Right 12/02/2021   Procedure: TOTAL HIP ARTHROPLASTY;  Surgeon: Joen Laura, MD;  Location: [facility] ORS;  Service: Orthopedics;  Laterality: Right;    Allergies  Allergen Reactions   Penicillins Swelling and Other (See Comments)    Facial swelling; can tolerate cephalosporins    Ace Inhibitors Other (See Comments)    Reaction unknown   Flagyl [Metronidazole] Other (See Comments)    Reaction unknown    Allergies as of 05/30/2023       Reactions   Penicillins Swelling, Other (See Comments)   Facial swelling; can tolerate cephalosporins   Ace Inhibitors Other (See Comments)   Reaction unknown   Flagyl [metronidazole] Other (See Comments)   Reaction unknown        Medication List        Accurate as of May 30, 2023 11:59 PM. If you have any questions, ask your nurse or doctor.          acetaminophen 325 MG tablet Commonly known as: TYLENOL Take 2 tablets (650 mg total) by mouth every 6 (six) hours as needed for mild pain, fever or headache.   aspirin EC 81 MG tablet Take 81 mg by mouth daily. Monday, Wednesday and Friday   atorvastatin 20 MG tablet Commonly known as: LIPITOR Take 4 tablets (80 mg total) by mouth daily.   bacitracin 500 UNIT/GM ointment Apply 1 Application topically daily. Apply to Lower leg wounds topically one time a day for healed bullous pemphigoid areas   carbamide peroxide 6.5 % OTIC solution Commonly known as: DEBROX Place 5 drops into both ears 4 (four) times daily.   carvedilol 3.125 MG tablet Commonly known as: COREG Take 1 tablet (3.125 mg total) by mouth 2 (two) times daily with a meal.   cholecalciferol 25 MCG (1000 UNIT) tablet Commonly known as: VITAMIN D3 Take 1,000 Units by mouth every Monday, Wednesday, and Friday.   FeroSul 325 (65 Fe) MG tablet Generic drug: ferrous sulfate Take 1 tablet (325 mg total) by mouth daily with breakfast. MON, WED, FRI   furosemide 20 MG tablet Commonly known  as: LASIX Take 1 tablet (20 mg total) by mouth daily as needed for edema or fluid.   guaiFENesin-dextromethorphan 100-10 MG/5ML syrup Commonly known as: ROBITUSSIN DM Take 10 mLs by mouth every 6 (six) hours as needed for cough.   ICAPS AREDS 2 PO Take 1 capsule by mouth daily with breakfast.   losartan 25 MG tablet Commonly known as: COZAAR Take 1 tablet (25 mg total) by mouth daily.   melatonin 3 MG Tabs tablet Take 1 tablet (3 mg total) by mouth at bedtime.   Myrbetriq 25 MG Tb24 tablet Generic drug: mirabegron ER TAKE 2 TABLETS BY MOUTH ONCE  DAILY   pantoprazole 40 MG tablet Commonly known as: PROTONIX  Take 1 tablet (40 mg total) by mouth daily.   potassium chloride SA 20 MEQ tablet Commonly known as: KLOR-CON M Take 1 tablet (20 mEq total) by mouth daily.   Sarna lotion Generic drug: camphor-menthol Apply 1 Application topically in the morning and at bedtime.   senna-docusate 8.6-50 MG tablet Commonly known as: Senokot-S Take 1 tablet by mouth at bedtime as needed for mild constipation.   spironolactone 25 MG tablet Commonly known as: ALDACTONE Take 0.5 tablets (12.5 mg total) by mouth daily.   vitamin C 250 MG tablet Commonly known as: ASCORBIC ACID Take 1 tablet (250 mg total) by mouth daily.        Review of Systems  Constitutional:  Positive for appetite change and fatigue. Negative for fever.  HENT:  Positive for hearing loss. Negative for congestion and sore throat.   Eyes:  Negative for visual disturbance.  Respiratory:  Positive for cough. Negative for shortness of breath.        Occasional cough, scant greenish phlegm production.   Cardiovascular:  Positive for leg swelling.  Gastrointestinal:  Positive for diarrhea, nausea and vomiting. Negative for abdominal pain and blood in stool.       Fecal incontinent.   Genitourinary:  Positive for frequency. Negative for dysuria and urgency.       Occasionally incontinent of urine. Urination average  every 4 hrs during day 1x/night.   Musculoskeletal:  Positive for arthralgias and gait problem.       Right lower leg/ankle brace  Skin:  Positive for wound.  Neurological:  Negative for speech difficulty, weakness and light-headedness.       Memory lapses.   Psychiatric/Behavioral:  Negative for behavioral problems and sleep disturbance. The patient is not nervous/anxious.     Immunization History  Administered Date(s) Administered   Fluad Quad(high Dose 65+) 03/28/2019   H1N1 03/18/2009   Influenza, High Dose Seasonal PF 03/25/2017, 03/16/2018, 03/26/2020, 03/14/2021, 03/30/2021, 03/14/2022, 03/30/2022   Influenza,inj,quad, With Preservative 03/26/2020, 03/30/2021   Influenza-Unspecified 02/25/2011, 03/31/2012, 03/27/2013, 03/14/2014, 03/25/2016, 06/14/2017, 03/28/2019, 03/16/2023   Moderna Covid-19 Vaccine Bivalent Booster 30yrs & up 03/30/2023   Moderna SARS-COV2 Booster Vaccination 04/22/2020, 10/28/2020, 03/30/2023   Moderna Sars-Covid-2 Vaccination 06/18/2019, 07/16/2019   PFIZER(Purple Top)SARS-COV-2 Vaccination 03/03/2021   PPD Test 12/25/2020, 01/05/2021, 12/17/2021   Pfizer Covid-19 Vaccine Bivalent Booster 46yrs & up 03/03/2021   Pneumococcal Conjugate-13 09/10/2014   Pneumococcal Polysaccharide-23 11/02/2004, 11/03/2014   Pneumococcal-Unspecified 06/14/2017   Rsv, Bivalent, Protein Subunit Rsvpref,pf (Abrysvo) 06/11/2022   Td (Adult),unspecified 12/02/2005   Tdap 11/27/2019   Unspecified SARS-COV-2 Vaccination 04/22/2020, 10/28/2020   Zoster Recombinant(Shingrix) 08/06/2020, 08/27/2022, 08/27/2022   Pertinent  Health Maintenance Due  Topic Date Due   INFLUENZA VACCINE  Completed   DEXA SCAN  Completed      06/17/2022    1:45 PM 07/01/2022   11:07 AM 09/08/2022    2:58 PM 09/23/2022    3:00 PM 11/05/2022    1:51 PM  Fall Risk  Falls in the past year? 0 0 0 0 0  Was there an injury with Fall? 0 0 0 0 0  Fall Risk Category Calculator 0 0 0 0 0  Fall Risk Category  (Retired) Low      (RETIRED) Patient Fall Risk Level Low fall risk      Patient at Risk for Falls Due to No Fall Risks No Fall Risks No Fall Risks No Fall Risks No Fall Risks  Fall risk Follow up Falls evaluation completed  Falls evaluation completed Falls evaluation completed Falls evaluation completed Falls evaluation completed   Functional Status Survey:    Vitals:   05/30/23 1108 05/31/23 1208  BP: (!) 158/90 (!) 140/70  Pulse: 80   Resp: 16   Temp: (!) 97.3 F (36.3 C)   Weight: 118 lb (53.5 kg)    Body mass index is 19.05 kg/m. Physical Exam Vitals reviewed.  Constitutional:      Comments: Alert, tired appearance in bed.   HENT:     Head: Normocephalic and atraumatic.     Nose: Nose normal.     Mouth/Throat:     Mouth: Mucous membranes are moist.  Eyes:     Extraocular Movements: Extraocular movements intact.     Conjunctiva/sclera: Conjunctivae normal.     Pupils: Pupils are equal, round, and reactive to light.  Cardiovascular:     Rate and Rhythm: Normal rate and regular rhythm.     Heart sounds: No murmur heard. Pulmonary:     Effort: Pulmonary effort is normal.     Breath sounds: Rales present.     Comments: Bibasilar rales.  Abdominal:     General: Bowel sounds are normal.     Palpations: Abdomen is soft.     Tenderness: There is no abdominal tenderness. There is no right CVA tenderness, left CVA tenderness, guarding or rebound.  Musculoskeletal:        General: No tenderness.     Cervical back: Normal range of motion and neck supple.     Right lower leg: Edema present.     Left lower leg: Edema present.     Comments: R ankle s/p ORIF 01/13/21. THR 11/2021. Edema trace to 1+ LLE, 1-2+ RLE  Skin:    General: Skin is warm and dry.     Findings: Erythema present.     Comments: Chronic pigmented venous insufficiency skin changes BLE. Opened bullae 2 x RLE. no s/s of infection presently.   Neurological:     Mental Status: She is alert. Mental status is at  baseline.     Gait: Gait abnormal.     Comments: Oriented to person, place.   Psychiatric:        Mood and Affect: Mood normal.        Behavior: Behavior normal.        Thought Content: Thought content normal.     Labs reviewed: Recent Labs    01/16/23 2032 01/17/23 0403 01/17/23 1645 01/18/23 0419 01/19/23 0933 01/22/23 0000 01/25/23 0000 04/06/23 0000  NA 130*   < > 134* 133* 129* 129* 132* 139  K <2.0*   < > 2.9* 4.1 2.9* 4.2 4.9 4.2  CL 87*   < > 95* 93* 92* 97* 99 103  CO2 30   < > 28 29 24 20  26* 24*  GLUCOSE 106*   < > 106* 111* 125*  --   --   --   BUN 36*   < > 35* 33* 30* 29*  --  25*  CREATININE 1.95*   < > 1.80* 2.00* 1.76* 1.5* 1.6* 1.6*  CALCIUM 9.1   < > 8.5* 9.0 8.9 9.1 9.2 9.3  MG 1.9  --   --  1.7  --   --   --   --   PHOS  --   --   --  2.3*  --   --   --   --    < > = values in this interval  not displayed.   Recent Labs    01/16/23 1125 01/17/23 0403 01/25/23 0000  AST 30 31 16   ALT 13 17 13   ALKPHOS 65 54 73  BILITOT 0.5 0.6  --   PROT 6.7 5.5*  --   ALBUMIN 4.0 3.0* 3.4*   Recent Labs    01/16/23 1125 01/17/23 0403 01/18/23 0419 01/25/23 0000 04/06/23 0000 05/02/23 1018 05/16/23 1001  WBC 8.0 6.5 7.3 9.1 6.5  --   --   NEUTROABS 4.9  --   --  6,270.00 3,354.00  --   --   HGB 10.9* 9.8* 9.9* 9.5* 8.9* 9.5* 10.8*  HCT 31.8* 30.2* 30.4* 28* 28*  --   --   MCV 93.8 98.7 98.4  --   --   --   --   PLT 385 318 318 268 282  --   --    Lab Results  Component Value Date   TSH 0.862 01/16/2023   No results found for: "HGBA1C" Lab Results  Component Value Date   CHOL 174 01/16/2023   HDL 40 (L) 01/16/2023   LDLCALC 120 (H) 01/16/2023   TRIG 69 01/16/2023   CHOLHDL 4.4 01/16/2023    Significant Diagnostic Results in last 30 days:  No results found.  Assessment/Plan: Acute gastritis nausea, vomiting, diarrhea x1 day, generalized weakness noted, denied abd pain, the patient is afebrile.  COVID test  May obtain lipase, C-diff  toxin A/B x3 if GI symptoms persist Zofran 4mg  q6hr prn x 24 hours.  VS daily x 72 hrs.   Hypokalemia K 4.2 04/06/23  Pemphigoid legs, responded steroid, on Doxy daily x 2 months course per dermatology, topical Bacitracin oint.   Anemia  Vit B12 2430, Folate >40 01/16/23,  EPO 9.9 09/15/21, On Fe,  chronic, on Pantoprazole for GI protection, FOBT negative. Hgb 10.8 05/16/23  GERD (gastroesophageal reflux disease)  stable, on Pantoprazole.   Congestive heart failure (CHF) (HCC) CHF/Edema BLE, chronic, minimal, on Aldactoneand Furosemide(increased to 40mg  03/01/23 per [facility]) now, 07/2021 venous US LLE negative DVT, prn Furosemide available. Followed by Cardiology. 01/2023 during hospitalization BNP 265, EF 40-45%, started on Carvedilol, Losartan, Aldactone, resumed Furosemide and off  Metolazone, Amlodipine.   CKD (chronic kidney disease) stage 4, GFR 15-29 ml/min (HCC) Bun/creat 25/1.6 04/06/23, saw nephrology, [facility]: increase Lasix to 40mg  every day   Gait abnormality Hx of R ankle fx, R hip total hip arthroplasty 11/30/21, ambulates with walker, risk of falling, last fall 05/23/23 w/o injury  Incontinent of urine Her urinary frequency/leakage, uses pads, it seems better since taking Myrbetriq in pm per Urology  Hypertension  Loose blood pressure controlled, off Amlodipine started 07/07/21 by Nephrology, now taking Losartan, Carvedilol, Furosemide, Spironolactone.    Family/ staff Communication: plan of care reviewed with the patient and charge nurse.   Labs/tests ordered:  none  Time spend 40 minutes.

## 2023-05-30 NOTE — Assessment & Plan Note (Signed)
Loose blood pressure controlled, off Amlodipine started 07/07/21 by Nephrology, now taking Losartan, Carvedilol, Furosemide, Spironolactone.

## 2023-05-30 NOTE — Assessment & Plan Note (Signed)
 Bun/creat 25/1.6 04/06/23, saw nephrology, [facility]: increase Lasix to 40mg  every day

## 2023-05-30 NOTE — Assessment & Plan Note (Signed)
 Vit B12 2430, Folate >40 01/16/23,  EPO 9.9 09/15/21, On Fe,  chronic, on Pantoprazole for GI protection, FOBT negative. Hgb 10.8 05/16/23

## 2023-05-30 NOTE — Assessment & Plan Note (Signed)
Her urinary frequency/leakage, uses pads, it seems better since taking Myrbetriq in pm per Urology ?

## 2023-05-30 NOTE — Assessment & Plan Note (Signed)
stable, on Pantoprazole  

## 2023-05-30 NOTE — Assessment & Plan Note (Addendum)
nausea, vomiting, diarrhea x1 day, generalized weakness noted, denied abd pain, the patient is afebrile.  COVID test  May obtain lipase, C-diff toxin A/B x3 if GI symptoms persist Zofran 4mg  q6hr prn x 24 hours.  VS daily x 72 hrs.

## 2023-05-30 NOTE — Assessment & Plan Note (Signed)
legs, responded steroid, on Doxy daily x 2 months course per dermatology, topical Bacitracin oint.

## 2023-05-30 NOTE — Assessment & Plan Note (Signed)
 Hx of R ankle fx, R hip total hip arthroplasty 11/30/21, ambulates with walker, risk of falling, last fall 05/23/23 w/o injury

## 2023-05-30 NOTE — Assessment & Plan Note (Signed)
K 4.2 04/06/23

## 2023-06-06 ENCOUNTER — Encounter (HOSPITAL_COMMUNITY)
Admission: RE | Admit: 2023-06-06 | Discharge: 2023-06-06 | Disposition: A | Discharge status: Home / Self Care | Payer: Medicare Other | Source: Ambulatory Visit | Attending: Nephrology | Admitting: Nephrology

## 2023-06-06 ENCOUNTER — Other Ambulatory Visit: Payer: Self-pay | Admitting: Internal Medicine

## 2023-06-06 VITALS — BP 124/85 | HR 77 | Temp 97.3°F

## 2023-06-06 DIAGNOSIS — N184 Chronic kidney disease, stage 4 (severe): Secondary | ICD-10-CM | POA: Insufficient documentation

## 2023-06-06 LAB — IRON AND TIBC
Iron: 116 ug/dL (ref 28–170)
Saturation Ratios: 44 % — ABNORMAL HIGH (ref 10.4–31.8)
TIBC: 265 ug/dL (ref 250–450)
UIBC: 149 ug/dL

## 2023-06-06 LAB — POCT HEMOGLOBIN-HEMACUE: Hemoglobin: 11.1 g/dL — ABNORMAL LOW (ref 12.0–15.0)

## 2023-06-06 LAB — FERRITIN: Ferritin: 94 ng/mL (ref 11–307)

## 2023-06-06 MED ORDER — EPOETIN ALFA-EPBX 10000 UNIT/ML IJ SOLN
INTRAMUSCULAR | Status: AC
  Filled 2023-06-06: qty 1

## 2023-06-06 MED ORDER — EPOETIN ALFA-EPBX 10000 UNIT/ML IJ SOLN
10000.0000 [IU] | INTRAMUSCULAR | Status: DC
Start: 2023-06-06 — End: 2024-06-18
  Administered 2023-06-06: 10000 [IU] via SUBCUTANEOUS

## 2023-06-09 DIAGNOSIS — D631 Anemia in chronic kidney disease: Secondary | ICD-10-CM | POA: Diagnosis not present

## 2023-06-09 DIAGNOSIS — I129 Hypertensive chronic kidney disease with stage 1 through stage 4 chronic kidney disease, or unspecified chronic kidney disease: Secondary | ICD-10-CM | POA: Diagnosis not present

## 2023-06-09 DIAGNOSIS — N189 Chronic kidney disease, unspecified: Secondary | ICD-10-CM | POA: Diagnosis not present

## 2023-06-09 DIAGNOSIS — R809 Proteinuria, unspecified: Secondary | ICD-10-CM | POA: Diagnosis not present

## 2023-06-09 DIAGNOSIS — N184 Chronic kidney disease, stage 4 (severe): Secondary | ICD-10-CM | POA: Diagnosis not present

## 2023-06-09 DIAGNOSIS — N1832 Chronic kidney disease, stage 3b: Secondary | ICD-10-CM | POA: Diagnosis not present

## 2023-06-09 LAB — LAB REPORT - SCANNED: EGFR: 24

## 2023-06-13 ENCOUNTER — Encounter (HOSPITAL_COMMUNITY): Payer: Medicare Other

## 2023-06-20 ENCOUNTER — Ambulatory Visit (HOSPITAL_COMMUNITY)
Admission: RE | Admit: 2023-06-20 | Discharge: 2023-06-20 | Disposition: A | Discharge status: Home / Self Care | Payer: Medicare Other | Source: Ambulatory Visit | Attending: Nephrology | Admitting: Nephrology

## 2023-06-20 VITALS — BP 115/71 | HR 79 | Temp 97.2°F | Resp 16

## 2023-06-20 DIAGNOSIS — N184 Chronic kidney disease, stage 4 (severe): Secondary | ICD-10-CM | POA: Diagnosis not present

## 2023-06-20 LAB — IRON AND TIBC
Iron: 105 ug/dL (ref 28–170)
Saturation Ratios: 40 % — ABNORMAL HIGH (ref 10.4–31.8)
TIBC: 266 ug/dL (ref 250–450)
UIBC: 161 ug/dL

## 2023-06-20 LAB — POCT HEMOGLOBIN-HEMACUE: Hemoglobin: 10.3 g/dL — ABNORMAL LOW (ref 12.0–15.0)

## 2023-06-20 LAB — FERRITIN: Ferritin: 81 ng/mL (ref 11–307)

## 2023-06-20 MED ORDER — EPOETIN ALFA-EPBX 10000 UNIT/ML IJ SOLN
10000.0000 [IU] | INTRAMUSCULAR | Status: DC
Start: 2023-06-20 — End: 2024-07-16

## 2023-06-20 MED ORDER — EPOETIN ALFA-EPBX 10000 UNIT/ML IJ SOLN
INTRAMUSCULAR | Status: AC
  Administered 2023-06-20: 10000 [IU] via SUBCUTANEOUS
  Filled 2023-06-20: qty 1

## 2023-06-28 ENCOUNTER — Encounter: Payer: Self-pay | Admitting: Nurse Practitioner

## 2023-06-28 ENCOUNTER — Non-Acute Institutional Stay: Payer: Self-pay | Admitting: Nurse Practitioner

## 2023-06-28 DIAGNOSIS — R269 Unspecified abnormalities of gait and mobility: Secondary | ICD-10-CM

## 2023-06-28 DIAGNOSIS — N184 Chronic kidney disease, stage 4 (severe): Secondary | ICD-10-CM | POA: Diagnosis not present

## 2023-06-28 DIAGNOSIS — I509 Heart failure, unspecified: Secondary | ICD-10-CM | POA: Diagnosis not present

## 2023-06-28 DIAGNOSIS — D649 Anemia, unspecified: Secondary | ICD-10-CM

## 2023-06-28 DIAGNOSIS — N3942 Incontinence without sensory awareness: Secondary | ICD-10-CM

## 2023-06-28 DIAGNOSIS — K219 Gastro-esophageal reflux disease without esophagitis: Secondary | ICD-10-CM | POA: Diagnosis not present

## 2023-06-28 DIAGNOSIS — I872 Venous insufficiency (chronic) (peripheral): Secondary | ICD-10-CM | POA: Diagnosis not present

## 2023-06-28 DIAGNOSIS — L129 Pemphigoid, unspecified: Secondary | ICD-10-CM | POA: Diagnosis not present

## 2023-06-28 DIAGNOSIS — I10 Essential (primary) hypertension: Secondary | ICD-10-CM

## 2023-06-28 NOTE — Assessment & Plan Note (Addendum)
 The patient presents with right leg swelling, which is more pronounced than the left. She denied pain or discomfort in the leg, and there are no open blisters or other visible skin breakdowns The swelling is not alleviated by wrapping the leg in gauze. They are currently taking a diuretic medication for this condition, no pain in the R calf palpated or with the R foot dorsiflexion. Chronic venous insufficiency skin changes BLE. Nephrology decreased Furosemide  40mg  to MWF/daily and stopped Kcl 06/14/23 Negative US  venous VLE in the past F/u nephrology, observe swelling, monitor wt x2/wk

## 2023-06-28 NOTE — Assessment & Plan Note (Signed)
 legs, responded steroid, on Doxy daily x 2 months course per dermatology, topical Bacitracin oint.

## 2023-06-28 NOTE — Assessment & Plan Note (Signed)
 Vit B12 2430, Folate >40 01/16/23,  EPO 9.9 09/15/21, On Fe(Iron 105 06/20/23),  chronic, on Pantoprazole for GI protection, FOBT negative. Hgb 10.3 06/20/23

## 2023-06-28 NOTE — Assessment & Plan Note (Signed)
Her urinary frequency/leakage, uses pads, it seems better since taking Myrbetriq in pm per Urology ?

## 2023-06-28 NOTE — Progress Notes (Signed)
 Location:   [facility] Room Number: 826 Place of Service:  ALF (13) Provider: Larwance Laticia Vannostrand NP  Latorya Bautch X, NP  Patient Care Team: Aza Dantes X, NP as PCP - General (Internal Medicine) Lonni Slain, MD as PCP - Cardiology (Cardiology)  Extended Emergency Contact Information Primary Emergency Contact: [name],[name] Address: [address]          [place], [place] [address] [place] Home Phone: [phone] Work Phone: [phone] Mobile Phone: [phone] Relation: Daughter Secondary Emergency Contact: [name],[name] Home Phone: [phone] Relation: Daughter  Code Status: DNR Goals of care: Advanced Directive information    05/26/2023   10:13 AM  Advanced Directives  Does Patient Have a Medical Advance Directive? Yes  Type of Estate Agent of [place];Out of facility DNR (pink MOST or yellow form)  Does patient want to make changes to medical advance directive? No - Patient declined  Copy of Healthcare Power of Attorney in Chart? Yes - validated most recent copy scanned in chart (See row information)  Pre-existing out of facility DNR order (yellow form or pink MOST form) Yellow form placed in chart (order not valid for inpatient use)     Chief Complaint  Patient presents with   Acute Visit    C/o RLE tenderness    HPI:  Pt is a 89 y.o. female seen today for an acute visit for c/o RLE tenderness.    Discussed the use of AI scribe software for clinical note transcription with the patient, who gave verbal consent to proceed.  History of Present Illness         The patient presents with right leg swelling, which is more pronounced than the left. She denied pain or discomfort in the leg, and there are no open blisters or other visible skin breakdowns The swelling is not alleviated by wrapping the leg in gauze. They are currently taking a diuretic medication for this condition, no pain in the R calf  palpated or with the R foot dorsiflexion. Chronic venous insufficiency skin changes BLE. Nephrology decreased Furosemide  40mg  to MWF/daily and stopped Kcl 06/14/23  The patient also mentions a condition called pemphigoid, for which they are taking a specific medication(prednisone  and Doxycycline ). Followed by dermatologist. She denied any chest cough, shortness of breath, or gastrointestinal symptoms. She reported good sleep and appetite. She also denied any need for pain medication such as Tylenol  for her leg. Hospitalized 01/16/23-01/19/23 s/p fall, left sided rib pain, CT head/cervical spine showed no acute intracranial process, negative Xray hips. 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  Anemia, Vit B12 2430, Folate >40 01/16/23,  EPO 9.9 09/15/21, On Fe(Iron 105 06/20/23),  chronic, on Pantoprazole  for GI protection, FOBT negative. Hgb 10.3 06/20/23 GERD, stable, on Pantoprazole .  CHF/Edema BLE, chronic, on Aldactoneand Furosemide (increased to 40mg  03/01/23 per [facility], then decreased to 3x/wk 06/14/23.  07/2021 venous US  LLE negative  DVT. Followed by Cardiology. 01/2023 during hospitalization BNP 265, EF 40-45%, started on Carvedilol , Losartan , Aldactone , resumed Furosemide  and off  Metolazone , Amlodipine .  CKD Bun/creat 25/1.6 04/06/23, saw nephrology, [facility]: reduced Lasix  to 40mg  MWF OA, Hx of R ankle fx, R hip total hip arthroplasty 11/30/21, ambulates with walker, risk of falling             Her urinary frequency/leakage,  uses pads, it seems better since taking Myrbetriq  in pm per Urology             HTN, blood pressure is controlled, off Amlodipine  started 07/07/21 by Nephrology, now taking Losartan , Carvedilol , Furosemide , Spironolactone .             Osteoporosis, off Alendronate  due to CKD, 11/05/20 DEXA t score -2.8, desires delaying DEXA             Fecal incontinence prn Imodium              Constipation, taking Senna             HLD, taking Atorvastatin .            Past Medical History:  Diagnosis Date   Anemia    Chronic kidney disease    stage 3   GERD (gastroesophageal reflux disease)    Hypertension    Past Surgical History:  Procedure Laterality Date   ORIF ANKLE FRACTURE Right 01/13/2021   Procedure: OPEN TREATMENT OF RIGHT TRIMALLEOLAR ANKLE FRACTURE WITHOUT POSTERIOR FIXATION, POSSIBLE SYNDESMOSIS;  Surgeon: Elsa Lonni SAUNDERS, MD;  Location: [facility] OR;  Service: Orthopedics;  Laterality: Right;   TOTAL HIP ARTHROPLASTY Right 12/02/2021   Procedure: TOTAL HIP ARTHROPLASTY;  Surgeon: Edna Toribio LABOR, MD;  Location: [facility] ORS;  Service: Orthopedics;  Laterality: Right;    Allergies  Allergen Reactions   Penicillins Swelling and Other (See Comments)    Facial swelling; can tolerate cephalosporins    Ace Inhibitors Other (See Comments)    Reaction unknown   Flagyl [Metronidazole] Other (See Comments)    Reaction unknown    Allergies as of 06/28/2023       Reactions   Penicillins Swelling, Other (See Comments)   Facial swelling; can tolerate cephalosporins   Ace Inhibitors Other (See Comments)   Reaction unknown   Flagyl [metronidazole] Other (See Comments)   Reaction unknown        Medication List        Accurate as of June 28, 2023  1:27 PM. If you have any questions, ask your nurse or doctor.          acetaminophen  325 MG tablet Commonly known as: TYLENOL  Take 2 tablets (650 mg total) by mouth every 6 (six) hours as needed for mild pain, fever or headache.    aspirin  EC 81 MG tablet Take 81 mg by mouth daily. Monday, Wednesday and Friday   atorvastatin  20 MG tablet Commonly known as: LIPITOR Take 4 tablets (80 mg total) by mouth daily.   bacitracin  500 UNIT/GM ointment Apply 1 Application topically daily. Apply to Lower leg wounds topically one time a day for healed bullous pemphigoid areas   carbamide peroxide 6.5 % OTIC solution Commonly known as: DEBROX Place 5 drops into both ears 4 (four) times daily.   carvedilol  3.125 MG tablet Commonly known as: COREG  Take 1 tablet (3.125 mg total) by mouth 2 (two) times daily with a meal.   cholecalciferol 25 MCG (1000 UNIT) tablet Commonly known as: VITAMIN D3 Take 1,000 Units by mouth  every Monday, Wednesday, and Friday.   FeroSul 325 (65 Fe) MG tablet Generic drug: ferrous sulfate  Take 1 tablet (325 mg total) by mouth daily with breakfast. MON, WED, FRI   furosemide  20 MG tablet Commonly known as: LASIX  Take 1 tablet (20 mg total) by mouth daily as needed for edema or fluid.   guaiFENesin -dextromethorphan 100-10 MG/5ML syrup Commonly known as: ROBITUSSIN DM Take 10 mLs by mouth every 6 (six) hours as needed for cough.   ICAPS AREDS 2 PO Take 1 capsule by mouth daily with breakfast.   losartan  25 MG tablet Commonly known as: COZAAR  Take 1 tablet (25 mg total) by mouth daily.   melatonin 3 MG Tabs tablet Take 1 tablet (3 mg total) by mouth at bedtime.   Myrbetriq  25 MG Tb24 tablet Generic drug: mirabegron  ER TAKE 2 TABLETS BY MOUTH ONCE  DAILY   pantoprazole  40 MG tablet Commonly known as: PROTONIX  Take 1 tablet (40 mg total) by mouth daily.   potassium chloride  SA 20 MEQ tablet Commonly known as: KLOR-CON  M Take 1 tablet (20 mEq total) by mouth daily.   Sarna lotion Generic drug: camphor-menthol Apply 1 Application topically in the morning and at bedtime.   senna-docusate 8.6-50 MG tablet Commonly known as: Senokot-S Take 1 tablet by mouth at bedtime as needed for  mild constipation.   spironolactone  25 MG tablet Commonly known as: ALDACTONE  Take 0.5 tablets (12.5 mg total) by mouth daily.   vitamin C  250 MG tablet Commonly known as: ASCORBIC ACID  Take 1 tablet (250 mg total) by mouth daily.        Review of Systems  Constitutional:  Negative for appetite change, fatigue and fever.  HENT:  Positive for hearing loss. Negative for congestion and sore throat.   Eyes:  Negative for visual disturbance.  Respiratory:  Negative for cough and shortness of breath.        Occasional cough, scant greenish phlegm production.   Cardiovascular:  Positive for leg swelling.  Gastrointestinal:  Positive for diarrhea, nausea and vomiting. Negative for abdominal pain and blood in stool.       Fecal incontinent.   Genitourinary:  Positive for frequency. Negative for dysuria and urgency.       Occasionally incontinent of urine. Urination average every 4 hrs during day 1x/night.   Musculoskeletal:  Positive for arthralgias and gait problem.       Right lower leg/ankle brace  Skin:  Positive for wound.  Neurological:  Negative for speech difficulty, weakness and light-headedness.       Memory lapses.   Psychiatric/Behavioral:  Negative for behavioral problems and sleep disturbance. The patient is not nervous/anxious.     Immunization History  Administered Date(s) Administered   Fluad Quad(high Dose 65+) 03/28/2019   H1N1 03/18/2009   Influenza, High Dose Seasonal PF 03/25/2017, 03/16/2018, 03/26/2020, 03/14/2021, 03/30/2021, 03/14/2022, 03/30/2022   Influenza,inj,quad, With Preservative 03/26/2020, 03/30/2021   Influenza-Unspecified 02/25/2011, 03/31/2012, 03/27/2013, 03/14/2014, 03/25/2016, 06/14/2017, 03/28/2019, 03/16/2023   Moderna Covid-19 Vaccine Bivalent Booster 25yrs & up 03/30/2023   Moderna SARS-COV2 Booster Vaccination 04/22/2020, 10/28/2020, 03/30/2023   Moderna Sars-Covid-2 Vaccination 06/18/2019, 07/16/2019   PFIZER(Purple Top)SARS-COV-2  Vaccination 03/03/2021   PPD Test 12/25/2020, 01/05/2021, 12/17/2021   Pfizer Covid-19 Vaccine Bivalent Booster 6yrs & up 03/03/2021   Pneumococcal Conjugate-13 09/10/2014   Pneumococcal Polysaccharide-23 11/02/2004, 11/03/2014   Pneumococcal-Unspecified 06/14/2017   Rsv, Bivalent, Protein Subunit Rsvpref,pf [place]) 06/11/2022   Td (Adult),unspecified 12/02/2005   Tdap 11/27/2019   Unspecified SARS-COV-2 Vaccination  04/22/2020, 10/28/2020   Zoster Recombinant(Shingrix) 08/06/2020, 08/27/2022, 08/27/2022   Pertinent  Health Maintenance Due  Topic Date Due   INFLUENZA VACCINE  Completed   DEXA SCAN  Completed      06/17/2022    1:45 PM 07/01/2022   11:07 AM 09/08/2022    2:58 PM 09/23/2022    3:00 PM 11/05/2022    1:51 PM  Fall Risk  Falls in the past year? 0 0 0 0 0  Was there an injury with Fall? 0 0 0 0 0  Fall Risk Category Calculator 0 0 0 0 0  Fall Risk Category (Retired) Low      (RETIRED) Patient Fall Risk Level Low fall risk      Patient at Risk for Falls Due to No Fall Risks No Fall Risks No Fall Risks No Fall Risks No Fall Risks  Fall risk Follow up Falls evaluation completed Falls evaluation completed Falls evaluation completed Falls evaluation completed Falls evaluation completed   Functional Status Survey:    Vitals:   06/28/23 1210  BP: (!) 152/84  Pulse: 80  Resp: 16  Temp: (!) 97.3 F (36.3 C)  SpO2: 96%  Weight: 117 lb 3.2 oz (53.2 kg)   Body mass index is 18.92 kg/m. Physical Exam Vitals reviewed.  Constitutional:      Appearance: Normal appearance.  HENT:     Head: Normocephalic and atraumatic.     Nose: Nose normal.     Mouth/Throat:     Mouth: Mucous membranes are moist.  Eyes:     Extraocular Movements: Extraocular movements intact.     Conjunctiva/sclera: Conjunctivae normal.     Pupils: Pupils are equal, round, and reactive to light.  Cardiovascular:     Rate and Rhythm: Normal rate and regular rhythm.     Heart sounds: No murmur  heard. Pulmonary:     Effort: Pulmonary effort is normal.     Breath sounds: Rales present.     Comments: Bibasilar rales.  Abdominal:     General: Bowel sounds are normal.     Palpations: Abdomen is soft.     Tenderness: There is no abdominal tenderness. There is no right CVA tenderness, left CVA tenderness, guarding or rebound.  Musculoskeletal:        General: No tenderness.     Cervical back: Normal range of motion and neck supple.     Right lower leg: Edema present.     Left lower leg: Edema present.     Comments: R ankle s/p ORIF 01/13/21. THR 11/2021. Edema trace to 1+ LLE, 1-2+ RLE  Skin:    General: Skin is warm and dry.     Findings: Erythema present.     Comments: Chronic pigmented venous insufficiency skin changes BLE. Mild warmth. No pain in the calves palpated or with dorsiflexion of R+L foot  Neurological:     Mental Status: She is alert. Mental status is at baseline.     Gait: Gait abnormal.     Comments: Oriented to person, place.   Psychiatric:        Mood and Affect: Mood normal.        Behavior: Behavior normal.        Thought Content: Thought content normal.     Labs reviewed: Recent Labs    01/16/23 2032 01/17/23 0403 01/17/23 1645 01/18/23 0419 01/19/23 0933 01/22/23 0000 01/25/23 0000 04/06/23 0000  NA 130*   < > 134* 133* 129* 129* 132* 139  K <  2.0*   < > 2.9* 4.1 2.9* 4.2 4.9 4.2  CL 87*   < > 95* 93* 92* 97* 99 103  CO2 30   < > 28 29 24 20  26* 24*  GLUCOSE 106*   < > 106* 111* 125*  --   --   --   BUN 36*   < > 35* 33* 30* 29*  --  25*  CREATININE 1.95*   < > 1.80* 2.00* 1.76* 1.5* 1.6* 1.6*  CALCIUM  9.1   < > 8.5* 9.0 8.9 9.1 9.2 9.3  MG 1.9  --   --  1.7  --   --   --   --   PHOS  --   --   --  2.3*  --   --   --   --    < > = values in this interval not displayed.   Recent Labs    01/16/23 1125 01/17/23 0403 01/25/23 0000  AST 30 31 16   ALT 13 17 13   ALKPHOS 65 54 73  BILITOT 0.5 0.6  --   PROT 6.7 5.5*  --   ALBUMIN 4.0 3.0*  3.4*   Recent Labs    01/16/23 1125 01/17/23 0403 01/18/23 0419 01/25/23 0000 04/06/23 0000 05/02/23 1018 05/16/23 1001 06/06/23 1231 06/20/23 1351  WBC 8.0 6.5 7.3 9.1 6.5  --   --   --   --   NEUTROABS 4.9  --   --  6,270.00 3,354.00  --   --   --   --   HGB 10.9* 9.8* 9.9* 9.5* 8.9*   < > 10.8* 11.1* 10.3*  HCT 31.8* 30.2* 30.4* 28* 28*  --   --   --   --   MCV 93.8 98.7 98.4  --   --   --   --   --   --   PLT 385 318 318 268 282  --   --   --   --    < > = values in this interval not displayed.   Lab Results  Component Value Date   TSH 0.862 01/16/2023   No results found for: HGBA1C Lab Results  Component Value Date   CHOL 174 01/16/2023   HDL 40 (L) 01/16/2023   LDLCALC 120 (H) 01/16/2023   TRIG 69 01/16/2023   CHOLHDL 4.4 01/16/2023    Significant Diagnostic Results in last 30 days:  No results found.  Assessment/Plan: Venous insufficiency (chronic) (peripheral) The patient presents with right leg swelling, which is more pronounced than the left. She denied pain or discomfort in the leg, and there are no open blisters or other visible skin breakdowns The swelling is not alleviated by wrapping the leg in gauze. They are currently taking a diuretic medication for this condition, no pain in the R calf palpated or with the R foot dorsiflexion. Chronic venous insufficiency skin changes BLE. Nephrology decreased Furosemide  40mg  to MWF/daily and stopped Kcl 06/14/23 Negative US  venous VLE in the past F/u nephrology, observe swelling, monitor wt x2/wk   Congestive heart failure (CHF) (HCC) CHF- BNP 265, EF 40-45%, 01/2023 in hospital :started on Carvedilol , Losartan , Aldactone , resumed Furosemide  and off  Metolazone , Amlodipine .   Pemphigoid  legs, responded steroid, on Doxy daily x 2 months course per dermatology, topical Bacitracin  oint.   Anemia  Vit B12 2430, Folate >40 01/16/23,  EPO 9.9 09/15/21, On Fe(Iron 105 06/20/23),  chronic, on Pantoprazole  for GI protection,  FOBT negative. Hgb  10.3 06/20/23  GERD (gastroesophageal reflux disease)  stable, on Pantoprazole .   CKD (chronic kidney disease) stage 4, GFR 15-29 ml/min (HCC) saw nephrology, [facility]: reduced Lasix  to 40mg  MWF  Gait abnormality ambulates with walker, risk of falling  Incontinent of urine   Her urinary frequency/leakage, uses pads, it seems better since taking Myrbetriq  in pm per Urology  Hypertension blood pressure is controlled, off Amlodipine  started 07/07/21 by Nephrology, now taking Losartan , Carvedilol , Furosemide , Spironolactone .    Family/ staff Communication: plan of care reviewed with the patient and charge nurse.   Labs/tests ordered:  none  Time spend 40 minutes.

## 2023-06-28 NOTE — Assessment & Plan Note (Signed)
 CHF- BNP 265, EF 40-45%, 01/2023 in hospital :started on Carvedilol, Losartan, Aldactone, resumed Furosemide and off  Metolazone, Amlodipine.

## 2023-06-28 NOTE — Assessment & Plan Note (Signed)
 ambulates with walker, risk of falling.

## 2023-06-28 NOTE — Assessment & Plan Note (Signed)
blood pressure is controlled, off Amlodipine started 07/07/21 by Nephrology, now taking Losartan, Carvedilol, Furosemide, Spironolactone.

## 2023-06-28 NOTE — Assessment & Plan Note (Signed)
 saw nephrology, [facility]: reduced Lasix to 40mg  MWF

## 2023-06-28 NOTE — Assessment & Plan Note (Signed)
stable, on Pantoprazole  

## 2023-07-01 ENCOUNTER — Other Ambulatory Visit (HOSPITAL_COMMUNITY): Payer: Self-pay | Admitting: Nephrology

## 2023-07-01 DIAGNOSIS — R600 Localized edema: Secondary | ICD-10-CM

## 2023-07-04 ENCOUNTER — Encounter (HOSPITAL_COMMUNITY): Payer: Self-pay

## 2023-07-05 ENCOUNTER — Ambulatory Visit (HOSPITAL_BASED_OUTPATIENT_CLINIC_OR_DEPARTMENT_OTHER)
Admission: RE | Admit: 2023-07-05 | Discharge: 2023-07-05 | Disposition: A | Discharge status: Home / Self Care | Payer: Medicare Other | Source: Ambulatory Visit | Attending: Nephrology | Admitting: Nephrology

## 2023-07-05 DIAGNOSIS — R600 Localized edema: Secondary | ICD-10-CM | POA: Diagnosis not present

## 2023-07-06 ENCOUNTER — Encounter (HOSPITAL_COMMUNITY): Payer: Self-pay

## 2023-07-12 DIAGNOSIS — I878 Other specified disorders of veins: Secondary | ICD-10-CM | POA: Diagnosis not present

## 2023-07-12 DIAGNOSIS — L120 Bullous pemphigoid: Secondary | ICD-10-CM | POA: Diagnosis not present

## 2023-07-12 DIAGNOSIS — M810 Age-related osteoporosis without current pathological fracture: Secondary | ICD-10-CM | POA: Diagnosis not present

## 2023-07-12 DIAGNOSIS — D692 Other nonthrombocytopenic purpura: Secondary | ICD-10-CM | POA: Diagnosis not present

## 2023-07-18 ENCOUNTER — Ambulatory Visit (HOSPITAL_COMMUNITY)
Admission: RE | Admit: 2023-07-18 | Discharge: 2023-07-18 | Disposition: A | Discharge status: Home / Self Care | Payer: Medicare Other | Source: Ambulatory Visit | Attending: Nephrology | Admitting: Nephrology

## 2023-07-18 VITALS — BP 107/78 | HR 77 | Temp 97.7°F | Resp 16

## 2023-07-18 DIAGNOSIS — N184 Chronic kidney disease, stage 4 (severe): Secondary | ICD-10-CM | POA: Insufficient documentation

## 2023-07-18 LAB — POCT HEMOGLOBIN-HEMACUE: Hemoglobin: 11 g/dL — ABNORMAL LOW (ref 12.0–15.0)

## 2023-07-18 LAB — IRON AND TIBC
Iron: 134 ug/dL (ref 28–170)
Saturation Ratios: 48 % — ABNORMAL HIGH (ref 10.4–31.8)
TIBC: 279 ug/dL (ref 250–450)
UIBC: 145 ug/dL

## 2023-07-18 LAB — FERRITIN: Ferritin: 74 ng/mL (ref 11–307)

## 2023-07-18 MED ORDER — EPOETIN ALFA-EPBX 10000 UNIT/ML IJ SOLN
10000.0000 [IU] | INTRAMUSCULAR | Status: DC
Start: 2023-07-18 — End: 2024-08-13
  Administered 2023-07-18: 10000 [IU] via SUBCUTANEOUS

## 2023-07-18 MED ORDER — EPOETIN ALFA-EPBX 10000 UNIT/ML IJ SOLN
INTRAMUSCULAR | Status: AC
  Filled 2023-07-18: qty 1

## 2023-07-28 DIAGNOSIS — H02835 Dermatochalasis of left lower eyelid: Secondary | ICD-10-CM | POA: Diagnosis not present

## 2023-07-28 DIAGNOSIS — H02831 Dermatochalasis of right upper eyelid: Secondary | ICD-10-CM | POA: Diagnosis not present

## 2023-07-28 DIAGNOSIS — H02054 Trichiasis without entropian left upper eyelid: Secondary | ICD-10-CM | POA: Diagnosis not present

## 2023-07-28 DIAGNOSIS — Z961 Presence of intraocular lens: Secondary | ICD-10-CM | POA: Diagnosis not present

## 2023-08-10 ENCOUNTER — Encounter (HOSPITAL_COMMUNITY): Payer: Self-pay

## 2023-08-12 ENCOUNTER — Ambulatory Visit (INDEPENDENT_AMBULATORY_CARE_PROVIDER_SITE_OTHER): Payer: Medicare Other | Admitting: Cardiology

## 2023-08-12 ENCOUNTER — Encounter (HOSPITAL_COMMUNITY): Payer: Self-pay

## 2023-08-12 ENCOUNTER — Encounter (HOSPITAL_BASED_OUTPATIENT_CLINIC_OR_DEPARTMENT_OTHER): Payer: Self-pay | Admitting: Cardiology

## 2023-08-12 VITALS — BP 128/78 | HR 85 | Ht 65.0 in | Wt 119.4 lb

## 2023-08-12 DIAGNOSIS — I491 Atrial premature depolarization: Secondary | ICD-10-CM | POA: Diagnosis not present

## 2023-08-12 DIAGNOSIS — I872 Venous insufficiency (chronic) (peripheral): Secondary | ICD-10-CM

## 2023-08-12 DIAGNOSIS — E782 Mixed hyperlipidemia: Secondary | ICD-10-CM

## 2023-08-12 DIAGNOSIS — I5022 Chronic systolic (congestive) heart failure: Secondary | ICD-10-CM

## 2023-08-12 DIAGNOSIS — I10 Essential (primary) hypertension: Secondary | ICD-10-CM

## 2023-08-12 MED ORDER — FUROSEMIDE 20 MG PO TABS: 40.0000 mg | ORAL_TABLET | Freq: Every day | ORAL | Status: DC | PRN

## 2023-08-12 MED ORDER — POTASSIUM CHLORIDE CRYS ER 20 MEQ PO TBCR: 10.0000 meq | EXTENDED_RELEASE_TABLET | Freq: Every day | ORAL | Status: AC

## 2023-08-12 NOTE — Progress Notes (Signed)
  Cardiology Office Note:  .   Date:  08/12/2023  ID:  Peggy [name], DOB [birth year], MRN [id number] PCP: Venita Sheffield, MD  [facility] Providers Cardiologist:  [name], MD {  History of Present Illness: .   Peggy [name] is a 89 y.o. female with PMH HTN, chronic kidney disease, chronic venous insufficiency. I met her during her hospitalization in 01/2023.  Pertinent CV history: Admitted after a fall, found to have Covid 01/2023. Concern for possible afib, though none seen definitively on ECG or telemetry. Available tracings appeared most likely to be wandering atrial pacemaker. EF at that time noted to be 40-45% (on my review, appeared more likely 50-55%). Started on GDMT.   She follows with Dr. Malen Gauze at [facility].   Today: Overall doing well. No palpitations. Her daughter notes one day she had some lightheadedness/dizziness but no syncope. We reviewed her current meds as well as her prior workup, see below.  ROS: Denies chest pain, shortness of breath at rest or with normal exertion. No PND, orthopnea, LE edema or unexpected weight gain. No syncope or palpitations. ROS otherwise negative except as noted.   Studies Reviewed: Marland Kitchen    EKG:       Physical Exam:   VS:  BP 128/78   Pulse 85   Ht 5\' 5"  (1.651 m)   Wt 119 lb 6.4 oz (54.2 kg)   SpO2 91%   BMI 19.87 kg/m    Wt Readings from Last 3 Encounters:  08/12/23 119 lb 6.4 oz (54.2 kg)  06/28/23 117 lb 3.2 oz (53.2 kg)  05/30/23 118 lb (53.5 kg)    GEN: Well nourished, well developed in no acute distress HEENT: Normal, moist mucous membranes NECK: No JVD CARDIAC: regular rhythm, normal S1 and S2, no rubs or gallops. No murmur. VASCULAR: Radial and DP pulses 2+ bilaterally. No carotid bruits RESPIRATORY:  Clear to auscultation without rales, wheezing or rhonchi  ABDOMEN: Soft, non-tender, non-distended MUSCULOSKELETAL:  Ambulates independently with rollator SKIN: Warm and dry, no  pitting edema but chronic brawny edema bilaterally NEUROLOGIC:  Alert and oriented x 3. No focal neuro deficits noted. PSYCHIATRIC:  Normal affect    ASSESSMENT AND PLAN: .    Chronic systolic and diastolic heart failure by echo, though appears euvolemic -EF while admitted read as 40-45% (appears more 50-55% to me) -appears euvolemic on exam today -tolerating carvedilol 6.25 mg BID and losartan 25 mg daily -on spironolactone 12.5 mg daily -has PRN lasix/potassium -we discussed rechecking echo, but after shared decision making, will not recheck unless she develops symptoms of heart failure  Hypertension -well controlled, continue meds as above  Venous insufficiency -stable  PAC/wandering atrial pacemaker -no definitive afib on any evaluation thus far -no current indication for anticoagulation  Hyperlipidemia -continue atorvastatin 80 mg daily -no indication for statin from CV perspective  Dispo: 1 year or sooner as needed  Signed, [name], MD   [name], MD, PhD, [facility]  [facility]    Heart & Vascular at [facility] at [facility] [address], [address] [place], [place] [address] [phone]

## 2023-08-12 NOTE — Patient Instructions (Addendum)
Medication Instructions:  Your physician recommends that you continue on your current medications as directed. Please refer to the Current Medication list given to you today.  *If you need a refill on your cardiac medications before your next appointment, please call your pharmacy*   Follow-Up: At [facility], you and your health needs are our priority.  As part of our continuing mission to provide you with exceptional heart care, we have created designated Provider Care Teams.  These Care Teams include your primary Cardiologist (physician) and Advanced Practice Providers (APPs -  Physician Assistants and Nurse Practitioners) who all work together to provide you with the care you need, when you need it.  We recommend signing up for the patient portal called "MyChart".  Sign up information is provided on this After Visit Summary.  MyChart is used to connect with patients for Virtual Visits (Telemedicine).  Patients are able to view lab/test results, encounter notes, upcoming appointments, etc.  Non-urgent messages can be sent to your provider as well.   To learn more about what you can do with MyChart, go to [email or web address].    Your next appointment:   1 year   Provider:   [name], MD

## 2023-08-15 ENCOUNTER — Encounter (HOSPITAL_COMMUNITY)
Admission: RE | Admit: 2023-08-15 | Discharge: 2023-08-15 | Disposition: A | Discharge status: Home / Self Care | Payer: Medicare Other | Source: Ambulatory Visit | Attending: Nephrology | Admitting: Nephrology

## 2023-08-15 VITALS — BP 128/73 | HR 81 | Temp 97.4°F | Resp 17

## 2023-08-15 DIAGNOSIS — N184 Chronic kidney disease, stage 4 (severe): Secondary | ICD-10-CM | POA: Diagnosis not present

## 2023-08-15 DIAGNOSIS — D631 Anemia in chronic kidney disease: Secondary | ICD-10-CM | POA: Insufficient documentation

## 2023-08-15 LAB — IRON AND TIBC
Iron: 123 ug/dL (ref 28–170)
Saturation Ratios: 52 % — ABNORMAL HIGH (ref 10.4–31.8)
TIBC: 235 ug/dL — ABNORMAL LOW (ref 250–450)
UIBC: 112 ug/dL

## 2023-08-15 LAB — POCT HEMOGLOBIN-HEMACUE: Hemoglobin: 11 g/dL — ABNORMAL LOW (ref 12.0–15.0)

## 2023-08-15 LAB — FERRITIN: Ferritin: 110 ng/mL (ref 11–307)

## 2023-08-15 MED ORDER — EPOETIN ALFA-EPBX 10000 UNIT/ML IJ SOLN
INTRAMUSCULAR | Status: AC
  Filled 2023-08-15: qty 1

## 2023-08-15 MED ORDER — EPOETIN ALFA-EPBX 10000 UNIT/ML IJ SOLN
10000.0000 [IU] | INTRAMUSCULAR | Status: DC
  Administered 2023-08-15: 10000 [IU] via SUBCUTANEOUS

## 2023-09-05 ENCOUNTER — Encounter: Payer: Self-pay | Admitting: Sports Medicine

## 2023-09-05 ENCOUNTER — Non-Acute Institutional Stay (SKILLED_NURSING_FACILITY): Payer: Self-pay | Admitting: Sports Medicine

## 2023-09-05 DIAGNOSIS — K219 Gastro-esophageal reflux disease without esophagitis: Secondary | ICD-10-CM | POA: Diagnosis not present

## 2023-09-05 DIAGNOSIS — E782 Mixed hyperlipidemia: Secondary | ICD-10-CM | POA: Diagnosis not present

## 2023-09-05 DIAGNOSIS — D509 Iron deficiency anemia, unspecified: Secondary | ICD-10-CM

## 2023-09-05 DIAGNOSIS — L120 Bullous pemphigoid: Secondary | ICD-10-CM

## 2023-09-05 DIAGNOSIS — N1832 Chronic kidney disease, stage 3b: Secondary | ICD-10-CM

## 2023-09-05 DIAGNOSIS — I10 Essential (primary) hypertension: Secondary | ICD-10-CM | POA: Diagnosis not present

## 2023-09-05 DIAGNOSIS — I509 Heart failure, unspecified: Secondary | ICD-10-CM

## 2023-09-05 NOTE — Progress Notes (Signed)
 Provider:  Dr. Venita Sheffield Location:  [facility] Place of Service:   Assisted living   PCP: Venita Sheffield, MD Patient Care Team: Venita Sheffield, MD as PCP - General (Internal Medicine) [name], MD as PCP - Cardiology (Cardiology)  Extended Emergency Contact Information Primary Emergency Contact: Mcgriff,[name] Address: [address]          [place], [place] [address] [name] Home Phone: [phone] Work Phone: [phone] Mobile Phone: [phone] Relation: Daughter Secondary Emergency Contact: [name],[name] Home Phone: [phone] Relation: Daughter  Goals of Care: Advanced Directive information    05/26/2023   10:13 AM  Advanced Directives  Does Patient Have a Medical Advance Directive? Yes  Type of Estate agent of [place];Out of facility DNR (pink MOST or yellow form)  Does patient want to make changes to medical advance directive? No - Patient declined  Copy of Healthcare Power of Attorney in Chart? Yes - validated most recent copy scanned in chart (See row information)  Pre-existing out of facility DNR order (yellow form or pink MOST form) Yellow form placed in chart (order not valid for inpatient use)      No chief complaint on file.   Discussed the use of AI scribe software for clinical note transcription with the patient, who gave verbal consent to proceed.  History of Present Illness             Past Medical History:  Diagnosis Date   Anemia    Chronic kidney disease    stage 3   GERD (gastroesophageal reflux disease)    Hypertension    Past Surgical History:  Procedure Laterality Date   ORIF ANKLE FRACTURE Right 01/13/2021   Procedure: OPEN TREATMENT OF RIGHT TRIMALLEOLAR ANKLE FRACTURE WITHOUT POSTERIOR FIXATION, POSSIBLE SYNDESMOSIS;  Surgeon: Terance Hart, MD;  Location: [facility] OR;  Service: Orthopedics;  Laterality: Right;   TOTAL HIP  ARTHROPLASTY Right 12/02/2021   Procedure: TOTAL HIP ARTHROPLASTY;  Surgeon: Joen Laura, MD;  Location: [facility] ORS;  Service: Orthopedics;  Laterality: Right;    reports that she quit smoking about 66 years ago. Her smoking use included cigarettes. She started smoking about 78 years ago. She has never used smokeless tobacco. She reports that she does not currently use alcohol. She reports that she does not use drugs. Social History   Socioeconomic History   Marital status: Widowed    Spouse name: Adela Lank   Number of children: 2   Years of education: Not on file   Highest education level: Not on file  Occupational History   Occupation: teacher    Comment: retired  Tobacco Use   Smoking status: Former    Current packs/day: 0.00    Types: Cigarettes    Start date: 06/14/1945    Quit date: 06/14/1957    Years since quitting: 66.2   Smokeless tobacco: Never  Vaping Use   Vaping status: Never Used  Substance and Sexual Activity   Alcohol use: Not Currently   Drug use: No   Sexual activity: Not on file  Other Topics Concern   Not on file  Social History Narrative   Social History     Socioeconomic History       Marital status: Married   09/01/1954       Spouse name: Adela Lank   Two people stay in the home on the fourth floor       Number of children: 2       Years of  education:       Highest education level: Not on file    Exercise: yoga, walking     Social Needs       Financial resource strain: Not on file       Food insecurity - worry: Not on file       Food insecurity - inability: Not on file       Transportation needs - medical: Not on file       Transportation needs - non-medical: Not on file     Occupational History       Not on file     Tobacco Use       Smoking status: Never Smoker       Smokeless tobacco: Never Used     Substance and Sexual Activity       Alcohol use: No       Drug use: No       Sexual activity: Not on file     Other Topics       Concerns:          Not on file     Social History Narrative       Not on file   Social Drivers of Health   Financial Resource Strain: Low Risk  (10/20/2017)   Overall Financial Resource Strain (CARDIA)    Difficulty of Paying Living Expenses: Not hard at all  Food Insecurity: No Food Insecurity (01/16/2023)   Hunger Vital Sign    Worried About Running Out of Food in the Last Year: Never true    Ran Out of Food in the Last Year: Never true  Transportation Needs: No Transportation Needs (01/16/2023)   PRAPARE - Administrator, Civil Service (Medical): No    Lack of Transportation (Non-Medical): No  Physical Activity: Inactive (10/20/2017)   Exercise Vital Sign    Days of Exercise per Week: 0 days    Minutes of Exercise per Session: 0 min  Stress: No Stress Concern Present (10/20/2017)   [employer] of Occupational Health - Occupational Stress Questionnaire    Feeling of Stress : Only a little  Social Connections: Somewhat Isolated (10/20/2017)   Social Connection and Isolation Panel [NHANES]    Frequency of Communication with Friends and Family: More than three times a week    Frequency of Social Gatherings with Friends and Family: More than three times a week    Attends Religious Services: Never    Database administrator or Organizations: No    Attends Banker Meetings: Never    Marital Status: Married  Catering manager Violence: Not At Risk (01/16/2023)   Humiliation, Afraid, Rape, and Kick questionnaire    Fear of Current or Ex-Partner: No    Emotionally Abused: No    Physically Abused: No    Sexually Abused: No    Functional Status Survey:    Family History  Problem Relation Age of Onset   Heart failure Mother    Heart disease Father    Arthritis Sister     Health Maintenance  Topic Date Due   COVID-19 Vaccine (6 - 2024-25 season) 05/25/2023   Medicare Annual Wellness (AWV)  04/11/2024   DTaP/Tdap/Td (2 - Td or Tdap) 11/26/2029   Pneumonia Vaccine 21+ Years old   Completed   INFLUENZA VACCINE  Completed   DEXA SCAN  Completed   Zoster Vaccines- Shingrix  Completed   HPV VACCINES  Aged Out    Allergies  Allergen Reactions   Penicillins Swelling and Other (See Comments)    Facial swelling; can tolerate cephalosporins    Ace Inhibitors Other (See Comments)    Reaction unknown   Flagyl [Metronidazole] Other (See Comments)    Reaction unknown    Outpatient Encounter Medications as of 09/05/2023  Medication Sig   acetaminophen (TYLENOL) 325 MG tablet Take 2 tablets (650 mg total) by mouth every 6 (six) hours as needed for mild pain, fever or headache.   aspirin EC 81 MG tablet Take 81 mg by mouth daily. Monday, Wednesday and Friday   atorvastatin (LIPITOR) 20 MG tablet Take 4 tablets (80 mg total) by mouth daily.   carvedilol (COREG) 6.25 MG tablet Take 6.25 mg by mouth 2 (two) times daily with a meal.   cholecalciferol (VITAMIN D3) 25 MCG (1000 UNIT) tablet Take 1,000 Units by mouth every Monday, Wednesday, and Friday.   doxycycline (VIBRAMYCIN) 50 MG capsule Take 50 mg by mouth 2 (two) times daily.   ferrous sulfate (FEROSUL) 325 (65 FE) MG tablet Take 1 tablet (325 mg total) by mouth daily with breakfast. MON, WED, FRI   furosemide (LASIX) 20 MG tablet Take 2 tablets (40 mg total) by mouth daily as needed for edema or fluid.   guaiFENesin-dextromethorphan (ROBITUSSIN DM) 100-10 MG/5ML syrup Take 10 mLs by mouth every 6 (six) hours as needed for cough.   losartan (COZAAR) 25 MG tablet Take 1 tablet (25 mg total) by mouth daily.   melatonin 3 MG TABS tablet Take 1 tablet (3 mg total) by mouth at bedtime.   Multiple Vitamins-Minerals (ICAPS AREDS 2 PO) Take 1 capsule by mouth daily with breakfast.   MYRBETRIQ 25 MG TB24 tablet TAKE 2 TABLETS BY MOUTH ONCE  DAILY   pantoprazole (PROTONIX) 40 MG tablet Take 1 tablet (40 mg total) by mouth daily.   potassium chloride SA (KLOR-CON M) 20 MEQ tablet Take 0.5 tablets (10 mEq total) by mouth daily.    senna-docusate (SENOKOT-S) 8.6-50 MG tablet Take 1 tablet by mouth at bedtime as needed for mild constipation.   spironolactone (ALDACTONE) 25 MG tablet Take 0.5 tablets (12.5 mg total) by mouth daily.   vitamin C (ASCORBIC ACID) 250 MG tablet Take 1 tablet (250 mg total) by mouth daily.   No facility-administered encounter medications on file as of 09/05/2023.    Review of Systems  Constitutional:  Negative for fever.  HENT:  Negative for sore throat.   Respiratory:  Negative for cough, shortness of breath and wheezing.   Cardiovascular:  Negative for chest pain, palpitations and leg swelling.  Gastrointestinal:  Negative for abdominal distention, abdominal pain, blood in stool, constipation, diarrhea and nausea.  Genitourinary:  Negative for dysuria.  Neurological:  Negative for dizziness.  Psychiatric/Behavioral:  Negative for confusion.    Negative unless indicated in HPI.  There were no vitals filed for this visit. There is no height or weight on file to calculate BMI. BP Readings from Last 3 Encounters:  08/15/23 128/73  08/12/23 128/78  07/18/23 107/78   Wt Readings from Last 3 Encounters:  08/12/23 119 lb 6.4 oz (54.2 kg)  06/28/23 117 lb 3.2 oz (53.2 kg)  05/30/23 118 lb (53.5 kg)   Physical Exam Constitutional:      Appearance: Normal appearance.  HENT:     Head: Normocephalic and atraumatic.  Cardiovascular:     Rate and Rhythm: Normal rate and regular rhythm.  Pulmonary:     Effort: Pulmonary effort is normal. No  respiratory distress.     Breath sounds: Normal breath sounds. No wheezing.  Abdominal:     General: Bowel sounds are normal. There is no distension.     Tenderness: There is no abdominal tenderness. There is no guarding.     Comments:    Musculoskeletal:        General: No swelling or tenderness.  Neurological:     Mental Status: She is alert. Mental status is at baseline.     Motor: No weakness.     Labs reviewed: Basic Metabolic  Panel: Recent Labs    01/16/23 2032 01/17/23 0403 01/17/23 1645 01/18/23 0419 01/19/23 0933 01/22/23 0000 01/25/23 0000 04/06/23 0000  NA 130*   < > 134* 133* 129* 129* 132* 139  K <2.0*   < > 2.9* 4.1 2.9* 4.2 4.9 4.2  CL 87*   < > 95* 93* 92* 97* 99 103  CO2 30   < > 28 29 24 20  26* 24*  GLUCOSE 106*   < > 106* 111* 125*  --   --   --   BUN 36*   < > 35* 33* 30* 29*  --  25*  CREATININE 1.95*   < > 1.80* 2.00* 1.76* 1.5* 1.6* 1.6*  CALCIUM 9.1   < > 8.5* 9.0 8.9 9.1 9.2 9.3  MG 1.9  --   --  1.7  --   --   --   --   PHOS  --   --   --  2.3*  --   --   --   --    < > = values in this interval not displayed.   Liver Function Tests: Recent Labs    01/16/23 1125 01/17/23 0403 01/25/23 0000  AST 30 31 16   ALT 13 17 13   ALKPHOS 65 54 73  BILITOT 0.5 0.6  --   PROT 6.7 5.5*  --   ALBUMIN 4.0 3.0* 3.4*   No results for input(s): "LIPASE", "AMYLASE" in the last 8760 hours. No results for input(s): "AMMONIA" in the last 8760 hours. CBC: Recent Labs    01/16/23 1125 01/17/23 0403 01/18/23 0419 01/25/23 0000 04/06/23 0000 05/02/23 1018 06/20/23 1351 07/18/23 1007 08/15/23 1024  WBC 8.0 6.5 7.3 9.1 6.5  --   --   --   --   NEUTROABS 4.9  --   --  6,270.00 3,354.00  --   --   --   --   HGB 10.9* 9.8* 9.9* 9.5* 8.9*   < > 10.3* 11.0* 11.0*  HCT 31.8* 30.2* 30.4* 28* 28*  --   --   --   --   MCV 93.8 98.7 98.4  --   --   --   --   --   --   PLT 385 318 318 268 282  --   --   --   --    < > = values in this interval not displayed.   Cardiac Enzymes: No results for input(s): "CKTOTAL", "CKMB", "CKMBINDEX", "TROPONINI" in the last 8760 hours. BNP: Invalid input(s): "POCBNP" No results found for: "HGBA1C" Lab Results  Component Value Date   TSH 0.862 01/16/2023   Lab Results  Component Value Date   VITAMINB12 2,430 (H) 01/16/2023   Lab Results  Component Value Date   FOLATE >40.0 01/16/2023   Lab Results  Component Value Date   IRON 123 08/15/2023   TIBC  235 (L) 08/15/2023   FERRITIN 110 08/15/2023  Imaging and Procedures obtained prior to SNF admission: No results found.  Assessment and Plan   Chronic systolic and diastolic heart failure EF 40-45%  Patient appears to be euvolemic on exam today Lungs clear no lower extremity swelling Continue with the Coreg, losartan Continue with spironolactone 12.5 mg daily Continue with Lasix, potassium supplements daily        Hypertension Blood pressure at goal 120/78 Continue with Coreg, losartan  Urinary incontinence Patient denies dysuria, hematuria Continue with Myrbetriq  History of GERD Denies heartburn, acid reflux Continue to pantoprazole 40 mg daily   History of iron deficiency anemia No signs of bleeding Continue with iron supple meds  Hyperlipidemia Denies muscle aches, cramps Continue with Lipitor.   History of bullous pemphigoid Follows with dermatology On doxycycline as per dermatology  History of CKD Creatinine 1.6 Avoid nephrotoxic medications Will check BMP

## 2023-09-12 ENCOUNTER — Encounter (HOSPITAL_COMMUNITY): Payer: Medicare Other

## 2023-09-13 ENCOUNTER — Ambulatory Visit (HOSPITAL_COMMUNITY)
Admission: RE | Admit: 2023-09-13 | Discharge: 2023-09-13 | Disposition: A | Discharge status: Home / Self Care | Source: Ambulatory Visit | Attending: Nephrology | Admitting: Nephrology

## 2023-09-13 VITALS — BP 111/73 | HR 77 | Temp 97.0°F | Resp 17

## 2023-09-13 DIAGNOSIS — N184 Chronic kidney disease, stage 4 (severe): Secondary | ICD-10-CM | POA: Diagnosis not present

## 2023-09-13 LAB — IRON AND TIBC
Iron: 81 ug/dL (ref 28–170)
Saturation Ratios: 33 % — ABNORMAL HIGH (ref 10.4–31.8)
TIBC: 245 ug/dL — ABNORMAL LOW (ref 250–450)
UIBC: 164 ug/dL

## 2023-09-13 LAB — FERRITIN: Ferritin: 131 ng/mL (ref 11–307)

## 2023-09-13 LAB — POCT HEMOGLOBIN-HEMACUE: Hemoglobin: 10.4 g/dL — ABNORMAL LOW (ref 12.0–15.0)

## 2023-09-13 MED ORDER — EPOETIN ALFA-EPBX 10000 UNIT/ML IJ SOLN
10000.0000 [IU] | INTRAMUSCULAR | Status: DC
  Administered 2023-09-13: 10000 [IU] via SUBCUTANEOUS

## 2023-09-13 MED ORDER — EPOETIN ALFA-EPBX 10000 UNIT/ML IJ SOLN
INTRAMUSCULAR | Status: AC
  Filled 2023-09-13: qty 1

## 2023-09-20 DIAGNOSIS — R634 Abnormal weight loss: Secondary | ICD-10-CM | POA: Diagnosis not present

## 2023-09-20 DIAGNOSIS — I10 Essential (primary) hypertension: Secondary | ICD-10-CM | POA: Diagnosis not present

## 2023-10-04 DIAGNOSIS — R809 Proteinuria, unspecified: Secondary | ICD-10-CM | POA: Diagnosis not present

## 2023-10-04 DIAGNOSIS — N184 Chronic kidney disease, stage 4 (severe): Secondary | ICD-10-CM | POA: Diagnosis not present

## 2023-10-04 DIAGNOSIS — N189 Chronic kidney disease, unspecified: Secondary | ICD-10-CM | POA: Diagnosis not present

## 2023-10-04 DIAGNOSIS — I129 Hypertensive chronic kidney disease with stage 1 through stage 4 chronic kidney disease, or unspecified chronic kidney disease: Secondary | ICD-10-CM | POA: Diagnosis not present

## 2023-10-04 DIAGNOSIS — D631 Anemia in chronic kidney disease: Secondary | ICD-10-CM | POA: Diagnosis not present

## 2023-10-11 ENCOUNTER — Encounter (HOSPITAL_COMMUNITY)

## 2023-10-11 DIAGNOSIS — M810 Age-related osteoporosis without current pathological fracture: Secondary | ICD-10-CM | POA: Diagnosis not present

## 2023-10-11 DIAGNOSIS — L120 Bullous pemphigoid: Secondary | ICD-10-CM | POA: Diagnosis not present

## 2023-10-11 DIAGNOSIS — D0472 Carcinoma in situ of skin of left lower limb, including hip: Secondary | ICD-10-CM | POA: Diagnosis not present

## 2023-10-11 DIAGNOSIS — D485 Neoplasm of uncertain behavior of skin: Secondary | ICD-10-CM | POA: Diagnosis not present

## 2023-10-11 DIAGNOSIS — D692 Other nonthrombocytopenic purpura: Secondary | ICD-10-CM | POA: Diagnosis not present

## 2023-10-11 DIAGNOSIS — I878 Other specified disorders of veins: Secondary | ICD-10-CM | POA: Diagnosis not present

## 2023-10-12 ENCOUNTER — Encounter (HOSPITAL_COMMUNITY)
Admission: RE | Admit: 2023-10-12 | Discharge: 2023-10-12 | Disposition: A | Discharge status: Home / Self Care | Source: Ambulatory Visit | Attending: Nephrology | Admitting: Nephrology

## 2023-10-12 VITALS — BP 107/64 | HR 73 | Temp 97.6°F | Resp 17

## 2023-10-12 DIAGNOSIS — D631 Anemia in chronic kidney disease: Secondary | ICD-10-CM | POA: Insufficient documentation

## 2023-10-12 DIAGNOSIS — Z79890 Hormone replacement therapy (postmenopausal): Secondary | ICD-10-CM | POA: Diagnosis not present

## 2023-10-12 DIAGNOSIS — N184 Chronic kidney disease, stage 4 (severe): Secondary | ICD-10-CM | POA: Diagnosis not present

## 2023-10-12 LAB — IRON AND TIBC
Iron: 108 ug/dL (ref 28–170)
Saturation Ratios: 43 % — ABNORMAL HIGH (ref 10.4–31.8)
TIBC: 249 ug/dL — ABNORMAL LOW (ref 250–450)
UIBC: 141 ug/dL

## 2023-10-12 LAB — POCT HEMOGLOBIN-HEMACUE: Hemoglobin: 10.4 g/dL — ABNORMAL LOW (ref 12.0–15.0)

## 2023-10-12 LAB — FERRITIN: Ferritin: 102 ng/mL (ref 11–307)

## 2023-10-12 MED ORDER — EPOETIN ALFA 10000 UNIT/ML IJ SOLN
10000.0000 [IU] | Freq: Once | INTRAMUSCULAR | Status: AC
  Administered 2023-10-12: 10000 [IU] via SUBCUTANEOUS

## 2023-10-12 MED ORDER — EPOETIN ALFA 10000 UNIT/ML IJ SOLN
INTRAMUSCULAR | Status: AC
  Filled 2023-10-12: qty 1

## 2023-11-09 ENCOUNTER — Ambulatory Visit (HOSPITAL_COMMUNITY)
Admission: RE | Admit: 2023-11-09 | Discharge: 2023-11-09 | Disposition: A | Discharge status: Home / Self Care | Source: Ambulatory Visit | Attending: Nephrology | Admitting: Nephrology

## 2023-11-09 VITALS — BP 117/72 | HR 73 | Temp 97.2°F | Resp 17

## 2023-11-09 DIAGNOSIS — D631 Anemia in chronic kidney disease: Secondary | ICD-10-CM | POA: Insufficient documentation

## 2023-11-09 DIAGNOSIS — N184 Chronic kidney disease, stage 4 (severe): Secondary | ICD-10-CM | POA: Insufficient documentation

## 2023-11-09 LAB — FERRITIN: Ferritin: 105 ng/mL (ref 11–307)

## 2023-11-09 LAB — IRON AND TIBC
Iron: 125 ug/dL (ref 28–170)
Saturation Ratios: 46 % — ABNORMAL HIGH (ref 10.4–31.8)
TIBC: 270 ug/dL (ref 250–450)
UIBC: 145 ug/dL

## 2023-11-09 LAB — POCT HEMOGLOBIN-HEMACUE: Hemoglobin: 10.6 g/dL — ABNORMAL LOW (ref 12.0–15.0)

## 2023-11-09 MED ORDER — EPOETIN ALFA-EPBX 10000 UNIT/ML IJ SOLN
10000.0000 [IU] | Freq: Once | INTRAMUSCULAR | Status: AC
  Administered 2023-11-09: 10000 [IU] via SUBCUTANEOUS

## 2023-11-09 MED ORDER — EPOETIN ALFA-EPBX 10000 UNIT/ML IJ SOLN
INTRAMUSCULAR | Status: AC
Start: 2023-11-09 — End: ?
  Filled 2023-11-09: qty 1

## 2023-11-21 DIAGNOSIS — R41841 Cognitive communication deficit: Secondary | ICD-10-CM | POA: Diagnosis not present

## 2023-11-21 DIAGNOSIS — N3946 Mixed incontinence: Secondary | ICD-10-CM | POA: Diagnosis not present

## 2023-11-21 DIAGNOSIS — R278 Other lack of coordination: Secondary | ICD-10-CM | POA: Diagnosis not present

## 2023-11-22 ENCOUNTER — Encounter: Payer: Self-pay | Admitting: Nurse Practitioner

## 2023-11-22 ENCOUNTER — Non-Acute Institutional Stay: Payer: Self-pay | Admitting: Nurse Practitioner

## 2023-11-22 ENCOUNTER — Other Ambulatory Visit: Payer: Medicare Other

## 2023-11-22 DIAGNOSIS — N3942 Incontinence without sensory awareness: Secondary | ICD-10-CM

## 2023-11-22 DIAGNOSIS — I509 Heart failure, unspecified: Secondary | ICD-10-CM | POA: Diagnosis not present

## 2023-11-22 DIAGNOSIS — I10 Essential (primary) hypertension: Secondary | ICD-10-CM

## 2023-11-22 DIAGNOSIS — L129 Pemphigoid, unspecified: Secondary | ICD-10-CM

## 2023-11-22 DIAGNOSIS — M150 Primary generalized (osteo)arthritis: Secondary | ICD-10-CM | POA: Diagnosis not present

## 2023-11-22 DIAGNOSIS — R41841 Cognitive communication deficit: Secondary | ICD-10-CM | POA: Diagnosis not present

## 2023-11-22 DIAGNOSIS — D649 Anemia, unspecified: Secondary | ICD-10-CM | POA: Diagnosis not present

## 2023-11-22 DIAGNOSIS — K219 Gastro-esophageal reflux disease without esophagitis: Secondary | ICD-10-CM

## 2023-11-22 DIAGNOSIS — N184 Chronic kidney disease, stage 4 (severe): Secondary | ICD-10-CM | POA: Diagnosis not present

## 2023-11-22 DIAGNOSIS — E785 Hyperlipidemia, unspecified: Secondary | ICD-10-CM

## 2023-11-22 DIAGNOSIS — N3946 Mixed incontinence: Secondary | ICD-10-CM | POA: Diagnosis not present

## 2023-11-22 DIAGNOSIS — M8000XD Age-related osteoporosis with current pathological fracture, unspecified site, subsequent encounter for fracture with routine healing: Secondary | ICD-10-CM

## 2023-11-22 DIAGNOSIS — I498 Other specified cardiac arrhythmias: Secondary | ICD-10-CM

## 2023-11-22 DIAGNOSIS — R278 Other lack of coordination: Secondary | ICD-10-CM | POA: Diagnosis not present

## 2023-11-22 NOTE — Assessment & Plan Note (Signed)
stable, on Pantoprazole  

## 2023-11-22 NOTE — Assessment & Plan Note (Signed)
taking Atorvastatin.  

## 2023-11-22 NOTE — Progress Notes (Unsigned)
 Location:  [facility] Room Number: [id number] Place of Service:  ALF [phone]) Provider:  Pradyun Ishman X, NP    Patient Care Team: Tye Gall, MD as PCP - General (Internal Medicine) Sheryle Donning, MD as PCP - Cardiology (Cardiology)  Extended Emergency Contact Information Primary Emergency Contact: Helvey,[name] Address: [address]          [place], [place] [address] [place] Home Phone: [phone] Work Phone: [phone] Mobile Phone: [phone] Relation: Daughter Secondary Emergency Contact: [name],[name] Home Phone: [phone] Relation: Daughter  Code Status:  DNR Goals of care: Advanced Directive information    11/22/2023    1:06 PM  Advanced Directives  Does Patient Have a Medical Advance Directive? Yes  Type of Estate agent of [place];Out of facility DNR (pink MOST or yellow form)  Copy of Healthcare Power of Attorney in Chart? Yes - validated most recent copy scanned in chart (See row information)  Pre-existing out of facility DNR order (yellow form or pink MOST form) Yellow form placed in chart (order not valid for inpatient use);Pink MOST form placed in chart (order not valid for inpatient use)     Chief Complaint  Patient presents with  . Medical Management of Chronic Issues    Routine Visit    HPI:  Pt is a 89 y.o. female seen today for medical management of chronic diseases.   New onset Afib,01/2023,  cardiology: no evidence of Afib, probably wandering atrial pacer,  CHF- BNP 265, EF 40-45%, started on Carvedilol , Losartan , Aldactone , ASA, resumed Furosemide  and off  Metolazone , Amlodipine .              Emotional outburst, better             Hypokalemia, K 4.2 04/06/23 Pemphigoid, legs, responded steroid, on Doxy per dermatology Anemia, Vit B12 2430, Folate >40 01/16/23,  EPO 9.9 09/15/21, On Fe(Iron 105 06/20/23),  chronic, on Pantoprazole  for GI protection, FOBT negative.  Iron 125, Hgb 10.6 11/09/23 GERD, stable, on Pantoprazole .  CHF/Edema BLE, chronic, on Aldactoneand Furosemide , followed by Cardiology. 01/2023 BNP 265, EF 40-45%, started on Carvedilol , Losartan , Aldactone , resumed Furosemide  and off  Metolazone , Amlodipine .  CKD Bun/creat 24/1.63 09/20/23, saw nephrology, [facility] OA, Hx of R ankle fx, R hip total hip arthroplasty 11/30/21, ambulates with walker, risk of falling             Her urinary frequency/leakage, uses pads, it seems better since taking Myrbetriq  in pm per Urology             HTN, blood pressure is controlled, off Amlodipine , on Losartan , Carvedilol , Furosemide , Spironolactone .             Osteoporosis, off Alendronate  due to CKD, 11/05/20 DEXA t score -2.8, desires delaying DEXA             Fecal incontinence prn Imodium              Constipation, taking Senna             HLD, taking Atorvastatin .          Past Medical History:  Diagnosis Date  . Anemia   . Chronic kidney disease    stage 3  . GERD (gastroesophageal reflux disease)   . Hypertension    Past Surgical History:  Procedure Laterality Date  . ORIF ANKLE FRACTURE Right 01/13/2021   Procedure: OPEN TREATMENT OF RIGHT TRIMALLEOLAR ANKLE FRACTURE WITHOUT POSTERIOR FIXATION, POSSIBLE SYNDESMOSIS;  Surgeon: Lasandra Points  R, MD;  Location: [facility] OR;  Service: Orthopedics;  Laterality: Right;  . TOTAL HIP ARTHROPLASTY Right 12/02/2021   Procedure: TOTAL HIP ARTHROPLASTY;  Surgeon: Murleen Arms, MD;  Location: [facility] ORS;  Service: Orthopedics;  Laterality: Right;    Allergies  Allergen Reactions  . Penicillins Swelling and Other (See Comments)    Facial swelling; can tolerate cephalosporins   . Ace Inhibitors Other (See Comments)    Reaction unknown  . Flagyl [Metronidazole] Other (See Comments)    Reaction unknown    Outpatient Encounter Medications as of 11/22/2023  Medication Sig  . acetaminophen  (TYLENOL ) 325 MG tablet Take 2 tablets (650 mg total) by  mouth every 6 (six) hours as needed for mild pain, fever or headache.  . aspirin  EC 81 MG tablet Take 81 mg by mouth daily. Monday, Wednesday and Friday  . atorvastatin  (LIPITOR) 20 MG tablet Take 4 tablets (80 mg total) by mouth daily.  . carvedilol  (COREG ) 6.25 MG tablet Take 6.25 mg by mouth 2 (two) times daily with a meal.  . cholecalciferol (VITAMIN D3) 25 MCG (1000 UNIT) tablet Take 1,000 Units by mouth every Monday, Wednesday, and Friday.  . doxycycline  (VIBRAMYCIN ) 50 MG capsule Take 50 mg by mouth 2 (two) times daily.  . ferrous sulfate  (FEROSUL) 325 (65 FE) MG tablet Take 1 tablet (325 mg total) by mouth daily with breakfast. MON, WED, FRI  . furosemide  (LASIX ) 20 MG tablet Take 2 tablets (40 mg total) by mouth daily as needed for edema or fluid.  . guaiFENesin -dextromethorphan (ROBITUSSIN DM) 100-10 MG/5ML syrup Take 10 mLs by mouth every 6 (six) hours as needed for cough.  . losartan  (COZAAR ) 25 MG tablet Take 1 tablet (25 mg total) by mouth daily.  . melatonin 3 MG TABS tablet Take 1 tablet (3 mg total) by mouth at bedtime.  . Multiple Vitamins-Minerals (ICAPS AREDS 2 PO) Take 1 capsule by mouth daily with breakfast.  . MYRBETRIQ  25 MG TB24 tablet TAKE 2 TABLETS BY MOUTH ONCE  DAILY  . pantoprazole  (PROTONIX ) 40 MG tablet Take 1 tablet (40 mg total) by mouth daily.  . potassium chloride  SA (KLOR-CON  M) 20 MEQ tablet Take 0.5 tablets (10 mEq total) by mouth daily.  Aaron Aas senna-docusate (SENOKOT-S) 8.6-50 MG tablet Take 1 tablet by mouth at bedtime as needed for mild constipation.  . spironolactone  (ALDACTONE ) 25 MG tablet Take 0.5 tablets (12.5 mg total) by mouth daily.  . vitamin C  (ASCORBIC ACID ) 250 MG tablet Take 1 tablet (250 mg total) by mouth daily.   No facility-administered encounter medications on file as of 11/22/2023.    Review of Systems  Constitutional:  Negative for appetite change, fatigue and fever.  HENT:  Positive for hearing loss. Negative for congestion and sore  throat.   Eyes:  Negative for visual disturbance.  Respiratory:  Negative for cough and shortness of breath.        Occasional cough, scant greenish phlegm production.   Cardiovascular:  Positive for leg swelling.  Gastrointestinal:  Negative for abdominal pain and constipation.       Fecal incontinent.   Genitourinary:  Positive for frequency. Negative for dysuria and urgency.       Occasionally incontinent of urine. Urination average every 4 hrs during day 1x/night.   Musculoskeletal:  Positive for arthralgias and gait problem.       Right lower leg/ankle brace  Skin:  Negative for color change.  Neurological:  Negative for speech difficulty, weakness and light-headedness.  Memory lapses.   Psychiatric/Behavioral:  Negative for behavioral problems and sleep disturbance. The patient is not nervous/anxious.     Immunization History  Administered Date(s) Administered  . Fluad Quad(high Dose 65+) 03/28/2019  . H1N1 03/18/2009  . Influenza, High Dose Seasonal PF 03/25/2017, 03/16/2018, 03/26/2020, 03/14/2021, 03/30/2021, 03/14/2022, 03/30/2022  . Influenza,inj,quad, With Preservative 03/26/2020, 03/30/2021  . Influenza-Unspecified 02/25/2011, 03/31/2012, 03/27/2013, 03/14/2014, 03/25/2016, 06/14/2017, 03/28/2019, 03/16/2023  . Moderna Covid-19 Vaccine Bivalent Booster 29yrs & up 03/30/2023  . Moderna SARS-COV2 Booster Vaccination 04/22/2020, 10/28/2020, 03/30/2023  . Moderna Sars-Covid-2 Vaccination 06/18/2019, 07/16/2019  . PFIZER(Purple Top)SARS-COV-2 Vaccination 03/03/2021  . PPD Test 12/25/2020, 01/05/2021, 12/17/2021  . Pfizer Covid-19 Vaccine Bivalent Booster 77yrs & up 03/03/2021  . Pneumococcal Conjugate-13 09/10/2014  . Pneumococcal Polysaccharide-23 11/02/2004, 11/03/2014  . Pneumococcal-Unspecified 06/14/2017  . Rsv, Bivalent, Protein Subunit Rsvpref,pf (Abrysvo) 06/11/2022  . Td (Adult),unspecified 12/02/2005  . Tdap 11/27/2019  . Unspecified SARS-COV-2 Vaccination  04/22/2020, 10/28/2020  . Zoster Recombinant(Shingrix) 08/06/2020, 06/28/2022, 08/27/2022, 08/27/2022   Pertinent  Health Maintenance Due  Topic Date Due  . INFLUENZA VACCINE  01/13/2024  . DEXA SCAN  Completed      06/17/2022    1:45 PM 07/01/2022   11:07 AM 09/08/2022    2:58 PM 09/23/2022    3:00 PM 11/05/2022    1:51 PM  Fall Risk  Falls in the past year? 0 0 0 0 0  Was there an injury with Fall? 0 0 0 0 0  Fall Risk Category Calculator 0 0 0 0 0  Fall Risk Category (Retired) Low      (RETIRED) Patient Fall Risk Level Low fall risk      Patient at Risk for Falls Due to No Fall Risks No Fall Risks No Fall Risks No Fall Risks No Fall Risks  Fall risk Follow up Falls evaluation completed Falls evaluation completed Falls evaluation completed Falls evaluation completed Falls evaluation completed   Functional Status Survey:    Vitals:   11/22/23 1132  BP: 133/82  Pulse: 79  Resp: 18  Temp: 97.8 F (36.6 C)  SpO2: 97%  Weight: 123 lb 6.4 oz (56 kg)  Height: 5\' 5"  (1.651 m)   Body mass index is 20.53 kg/m. Physical Exam Vitals reviewed.  Constitutional:      Appearance: Normal appearance.  HENT:     Head: Normocephalic and atraumatic.     Nose: Nose normal.     Mouth/Throat:     Mouth: Mucous membranes are moist.  Eyes:     Extraocular Movements: Extraocular movements intact.     Conjunctiva/sclera: Conjunctivae normal.     Pupils: Pupils are equal, round, and reactive to light.  Cardiovascular:     Rate and Rhythm: Normal rate and regular rhythm.     Heart sounds: No murmur heard. Pulmonary:     Effort: Pulmonary effort is normal.     Breath sounds: Rales present.     Comments: Bibasilar rales.  Abdominal:     General: Bowel sounds are normal.     Palpations: Abdomen is soft.     Tenderness: There is no abdominal tenderness.  Musculoskeletal:        General: No tenderness.     Cervical back: Normal range of motion and neck supple.     Right lower leg: Edema  present.     Left lower leg: Edema present.     Comments: R ankle s/p ORIF 01/13/21. THR 11/2021. Edema trace BLE  Skin:  General: Skin is warm and dry.     Comments: Chronic pigmented venous insufficiency skin changes BLE.   Neurological:     Mental Status: She is alert. Mental status is at baseline.     Gait: Gait abnormal.     Comments: Oriented to person, place.   Psychiatric:        Mood and Affect: Mood normal.        Behavior: Behavior normal.        Thought Content: Thought content normal.    Labs reviewed: Recent Labs    01/16/23 2032 01/17/23 0403 01/17/23 1645 01/18/23 0419 01/19/23 0933 01/22/23 0000 01/25/23 0000 04/06/23 0000  NA 130*   < > 134* 133* 129* 129* 132* 139  K <2.0*   < > 2.9* 4.1 2.9* 4.2 4.9 4.2  CL 87*   < > 95* 93* 92* 97* 99 103  CO2 30   < > 28 29 24 20  26* 24*  GLUCOSE 106*   < > 106* 111* 125*  --   --   --   BUN 36*   < > 35* 33* 30* 29*  --  25*  CREATININE 1.95*   < > 1.80* 2.00* 1.76* 1.5* 1.6* 1.6*  CALCIUM  9.1   < > 8.5* 9.0 8.9 9.1 9.2 9.3  MG 1.9  --   --  1.7  --   --   --   --   PHOS  --   --   --  2.3*  --   --   --   --    < > = values in this interval not displayed.   Recent Labs    01/16/23 1125 01/17/23 0403 01/25/23 0000  AST 30 31 16   ALT 13 17 13   ALKPHOS 65 54 73  BILITOT 0.5 0.6  --   PROT 6.7 5.5*  --   ALBUMIN 4.0 3.0* 3.4*   Recent Labs    01/16/23 1125 01/17/23 0403 01/18/23 0419 01/25/23 0000 04/06/23 0000 05/02/23 1018 09/13/23 1007 10/12/23 1006 11/09/23 1013  WBC 8.0 6.5 7.3 9.1 6.5  --   --   --   --   NEUTROABS 4.9  --   --  6,270.00 3,354.00  --   --   --   --   HGB 10.9* 9.8* 9.9* 9.5* 8.9*   < > 10.4* 10.4* 10.6*  HCT 31.8* 30.2* 30.4* 28* 28*  --   --   --   --   MCV 93.8 98.7 98.4  --   --   --   --   --   --   PLT 385 318 318 268 282  --   --   --   --    < > = values in this interval not displayed.   Lab Results  Component Value Date   TSH 0.862 01/16/2023   No results found  for: "HGBA1C" Lab Results  Component Value Date   CHOL 174 01/16/2023   HDL 40 (L) 01/16/2023   LDLCALC 120 (H) 01/16/2023   TRIG 69 01/16/2023   CHOLHDL 4.4 01/16/2023    Significant Diagnostic Results in last 30 days:  No results found.  Assessment/Plan Congestive heart failure (CHF) (HCC) CHF/Edema BLE, chronic, on Aldactoneand Furosemide , followed by Cardiology. 01/2023 BNP 265, EF 40-45%, started on Carvedilol , Losartan , Aldactone , resumed Furosemide  and off  Metolazone , Amlodipine .   CKD (chronic kidney disease) stage 4, GFR 15-29 ml/min (HCC)  Bun/creat 24/1.63 09/20/23,  saw nephrology, [facility]  Osteoarthritis, multiple sites Hx of R ankle fx, R hip total hip arthroplasty 11/30/21, ambulates with walker, risk of falling  Incontinent of urine Her urinary frequency/leakage, uses pads, it seems better since taking Myrbetriq  in pm per Urology  Hypertension blood pressure is controlled, off Amlodipine , on Losartan , Carvedilol , Furosemide , Spironolactone .  Osteoporosis off Alendronate  due to CKD, 11/05/20 DEXA t score -2.8, desires delaying DEXA  Hyperlipidemia taking Atorvastatin .   GERD (gastroesophageal reflux disease) stable, on Pantoprazole .   Anemia Vit B12 2430, Folate >40 01/16/23,  EPO 9.9 09/15/21, On Fe(Iron 105 06/20/23),  chronic, on Pantoprazole  for GI protection, FOBT negative. Iron 125, Hgb 10.6 11/09/23  Pemphigoid legs, responded steroid, on Doxy per dermatology  Atrial arrhythmia New onset Afib,01/2023,  cardiology: no evidence of Afib, probably wandering atrial pacer,  CHF- BNP 265, EF 40-45%, started on Carvedilol , Losartan , Aldactone , ASA, resumed Furosemide  and off  Metolazone , Amlodipine .      Family/ staff Communication: plan of care reviewed with the patient and charge nurse.   Labs/tests ordered:  none

## 2023-11-22 NOTE — Assessment & Plan Note (Signed)
 Vit B12 2430, Folate >40 01/16/23,  EPO 9.9 09/15/21, On Fe(Iron 105 06/20/23),  chronic, on Pantoprazole  for GI protection, FOBT negative. Iron 125, Hgb 10.6 11/09/23

## 2023-11-22 NOTE — Assessment & Plan Note (Signed)
Her urinary frequency/leakage, uses pads, it seems better since taking Myrbetriq in pm per Urology ?

## 2023-11-22 NOTE — Assessment & Plan Note (Signed)
 blood pressure is controlled, off Amlodipine , on Losartan , Carvedilol , Furosemide , Spironolactone .

## 2023-11-22 NOTE — Assessment & Plan Note (Signed)
 legs, responded steroid, on Doxy per dermatology

## 2023-11-22 NOTE — Assessment & Plan Note (Signed)
off Alendronate due to CKD, 11/05/20 DEXA t score -2.8, desires delaying DEXA

## 2023-11-22 NOTE — Assessment & Plan Note (Signed)
 New onset Afib,01/2023,  cardiology: no evidence of Afib, probably wandering atrial pacer,  CHF- BNP 265, EF 40-45%, started on Carvedilol , Losartan , Aldactone , ASA, resumed Furosemide  and off  Metolazone , Amlodipine .

## 2023-11-22 NOTE — Assessment & Plan Note (Signed)
 Bun/creat 24/1.63 09/20/23, saw nephrology, [facility]

## 2023-11-22 NOTE — Assessment & Plan Note (Signed)
 CHF/Edema BLE, chronic, on Aldactoneand Furosemide , followed by Cardiology. 01/2023 BNP 265, EF 40-45%, started on Carvedilol , Losartan , Aldactone , resumed Furosemide  and off  Metolazone , Amlodipine .

## 2023-11-22 NOTE — Assessment & Plan Note (Signed)
 Hx of R ankle fx, R hip total hip arthroplasty 11/30/21, ambulates with walker, risk of falling

## 2023-11-25 DIAGNOSIS — N3946 Mixed incontinence: Secondary | ICD-10-CM | POA: Diagnosis not present

## 2023-11-25 DIAGNOSIS — R278 Other lack of coordination: Secondary | ICD-10-CM | POA: Diagnosis not present

## 2023-11-25 DIAGNOSIS — R41841 Cognitive communication deficit: Secondary | ICD-10-CM | POA: Diagnosis not present

## 2023-11-28 DIAGNOSIS — R41841 Cognitive communication deficit: Secondary | ICD-10-CM | POA: Diagnosis not present

## 2023-11-28 DIAGNOSIS — R278 Other lack of coordination: Secondary | ICD-10-CM | POA: Diagnosis not present

## 2023-11-28 DIAGNOSIS — N3946 Mixed incontinence: Secondary | ICD-10-CM | POA: Diagnosis not present

## 2023-11-29 DIAGNOSIS — R41841 Cognitive communication deficit: Secondary | ICD-10-CM | POA: Diagnosis not present

## 2023-11-29 DIAGNOSIS — R278 Other lack of coordination: Secondary | ICD-10-CM | POA: Diagnosis not present

## 2023-11-29 DIAGNOSIS — N3946 Mixed incontinence: Secondary | ICD-10-CM | POA: Diagnosis not present

## 2023-11-30 ENCOUNTER — Ambulatory Visit (HOSPITAL_BASED_OUTPATIENT_CLINIC_OR_DEPARTMENT_OTHER)
Admission: RE | Admit: 2023-11-30 | Discharge: 2023-11-30 | Disposition: A | Discharge status: Home / Self Care | Source: Ambulatory Visit | Attending: Sports Medicine | Admitting: Sports Medicine

## 2023-11-30 DIAGNOSIS — Z780 Asymptomatic menopausal state: Secondary | ICD-10-CM | POA: Diagnosis not present

## 2023-11-30 DIAGNOSIS — R278 Other lack of coordination: Secondary | ICD-10-CM | POA: Diagnosis not present

## 2023-11-30 DIAGNOSIS — E559 Vitamin D deficiency, unspecified: Secondary | ICD-10-CM | POA: Insufficient documentation

## 2023-11-30 DIAGNOSIS — R41841 Cognitive communication deficit: Secondary | ICD-10-CM | POA: Diagnosis not present

## 2023-11-30 DIAGNOSIS — Z13820 Encounter for screening for osteoporosis: Secondary | ICD-10-CM | POA: Insufficient documentation

## 2023-11-30 DIAGNOSIS — M810 Age-related osteoporosis without current pathological fracture: Secondary | ICD-10-CM | POA: Diagnosis not present

## 2023-11-30 DIAGNOSIS — N3946 Mixed incontinence: Secondary | ICD-10-CM | POA: Diagnosis not present

## 2023-12-01 DIAGNOSIS — R41841 Cognitive communication deficit: Secondary | ICD-10-CM | POA: Diagnosis not present

## 2023-12-01 DIAGNOSIS — N3946 Mixed incontinence: Secondary | ICD-10-CM | POA: Diagnosis not present

## 2023-12-01 DIAGNOSIS — R278 Other lack of coordination: Secondary | ICD-10-CM | POA: Diagnosis not present

## 2023-12-05 ENCOUNTER — Encounter: Payer: Self-pay | Admitting: Sports Medicine

## 2023-12-05 ENCOUNTER — Non-Acute Institutional Stay: Payer: Self-pay | Admitting: Sports Medicine

## 2023-12-05 DIAGNOSIS — M810 Age-related osteoporosis without current pathological fracture: Secondary | ICD-10-CM | POA: Diagnosis not present

## 2023-12-05 DIAGNOSIS — I10 Essential (primary) hypertension: Secondary | ICD-10-CM | POA: Diagnosis not present

## 2023-12-05 DIAGNOSIS — R278 Other lack of coordination: Secondary | ICD-10-CM | POA: Diagnosis not present

## 2023-12-05 DIAGNOSIS — N1832 Chronic kidney disease, stage 3b: Secondary | ICD-10-CM | POA: Diagnosis not present

## 2023-12-05 DIAGNOSIS — N3946 Mixed incontinence: Secondary | ICD-10-CM | POA: Diagnosis not present

## 2023-12-05 DIAGNOSIS — I509 Heart failure, unspecified: Secondary | ICD-10-CM

## 2023-12-05 DIAGNOSIS — R41841 Cognitive communication deficit: Secondary | ICD-10-CM | POA: Diagnosis not present

## 2023-12-05 DIAGNOSIS — D649 Anemia, unspecified: Secondary | ICD-10-CM

## 2023-12-05 NOTE — Progress Notes (Signed)
 Location:  [facility]  Nursing Home Room Number: [id number] Place of Service:  ALF [phone]) Provider:  Sherlynn Madden, MD   Sherlynn Madden, MD  Patient Care Team: Sherlynn Madden, MD as PCP - General (Internal Medicine) Lonni Slain, MD as PCP - Cardiology (Cardiology)  Extended Emergency Contact Information Primary Emergency Contact: Zilka,[name] Address: [address]          [place], [place] [address] [place] Home Phone: [phone] Work Phone: [phone] Mobile Phone: [phone] Relation: Daughter Secondary Emergency Contact: [name],[name] Home Phone: [phone] Relation: Daughter  Code Status:  DNR Goals of care: Advanced Directive information    12/05/2023   11:39 AM  Advanced Directives  Does Patient Have a Medical Advance Directive? Yes  Type of Estate agent of [place];Out of facility DNR (pink MOST or yellow form)  Does patient want to make changes to medical advance directive? No - Patient declined  Copy of Healthcare Power of Attorney in Chart? Yes - validated most recent copy scanned in chart (See row information)  Pre-existing out of facility DNR order (yellow form or pink MOST form) Pink MOST form placed in chart (order not valid for inpatient use)     Chief Complaint  Patient presents with   Acute Visit    Patient being seen for bone density follow up.     HPI:  Pt is a 89 y.o. female with past medical history of A-fib, pemphigoid, anemia, GERD, CHF, CKD, osteoarthritis is  seen today for an acute visit for follow-up on bone density test. Patient seen and examined in her room.  She seems pleasant and comfortable watching TV.  Denies headache, nausea, vomiting, blurry or double vision, chest pain, shortness of breath, abdominal pain, nausea, vomiting, dysuria, hematuria. Patient had a recent bone density which showed osteoporosis. As per chart review patient was  on Fosamax  but was stopped due to worsening CKD. Patient ambulates with a rollator walker.  No recent falls. Denies joint pains. As per nursing staff no acute concern.     Past Medical History:  Diagnosis Date   Anemia    Chronic kidney disease    stage 3   GERD (gastroesophageal reflux disease)    Hypertension    Past Surgical History:  Procedure Laterality Date   ORIF ANKLE FRACTURE Right 01/13/2021   Procedure: OPEN TREATMENT OF RIGHT TRIMALLEOLAR ANKLE FRACTURE WITHOUT POSTERIOR FIXATION, POSSIBLE SYNDESMOSIS;  Surgeon: Elsa Lonni SAUNDERS, MD;  Location: [facility] OR;  Service: Orthopedics;  Laterality: Right;   TOTAL HIP ARTHROPLASTY Right 12/02/2021   Procedure: TOTAL HIP ARTHROPLASTY;  Surgeon: Edna Toribio LABOR, MD;  Location: [facility] ORS;  Service: Orthopedics;  Laterality: Right;    Allergies  Allergen Reactions   Penicillins Swelling and Other (See Comments)    Facial swelling; can tolerate cephalosporins    Ace Inhibitors Other (See Comments)    Reaction unknown   Flagyl [Metronidazole] Other (See Comments)    Reaction unknown    Allergies as of 12/05/2023       Reactions   Penicillins Swelling, Other (See Comments)   Facial swelling; can tolerate cephalosporins   Ace Inhibitors Other (See Comments)   Reaction unknown   Flagyl [metronidazole] Other (See Comments)   Reaction unknown        Medication List        Accurate as of December 05, 2023 11:42 AM. If you have any questions, ask your nurse or doctor.  acetaminophen  325 MG tablet Commonly known as: TYLENOL  Take 2 tablets (650 mg total) by mouth every 6 (six) hours as needed for mild pain, fever or headache.   aspirin  EC 81 MG tablet Take 81 mg by mouth daily. Monday, Wednesday and Friday   atorvastatin  20 MG tablet Commonly known as: LIPITOR Take 20 mg by mouth daily.   atorvastatin  20 MG tablet Commonly known as: LIPITOR Take 4 tablets (80 mg total) by mouth daily.   carvedilol  6.25  MG tablet Commonly known as: COREG  Take 6.25 mg by mouth 2 (two) times daily with a meal.   cholecalciferol 25 MCG (1000 UNIT) tablet Commonly known as: VITAMIN D3 Take 1,000 Units by mouth every Monday, Wednesday, and Friday.   doxycycline  50 MG capsule Commonly known as: VIBRAMYCIN  Take 50 mg by mouth 2 (two) times daily.   FeroSul 325 (65 Fe) MG tablet Generic drug: ferrous sulfate  Take 1 tablet (325 mg total) by mouth daily with breakfast. MON, WED, FRI   fluorouracil 5 % cream Commonly known as: EFUDEX Apply 1 Application topically daily.   furosemide  20 MG tablet Commonly known as: LASIX  Take 2 tablets (40 mg total) by mouth daily as needed for edema or fluid.   guaiFENesin -dextromethorphan 100-10 MG/5ML syrup Commonly known as: ROBITUSSIN DM Take 10 mLs by mouth every 6 (six) hours as needed for cough.   ICAPS AREDS 2 PO Take 1 capsule by mouth daily with breakfast.   losartan  25 MG tablet Commonly known as: COZAAR  Take 1 tablet (25 mg total) by mouth daily.   melatonin 3 MG Tabs tablet Take 1 tablet (3 mg total) by mouth at bedtime.   Myrbetriq  25 MG Tb24 tablet Generic drug: mirabegron  ER TAKE 2 TABLETS BY MOUTH ONCE  DAILY   pantoprazole  40 MG tablet Commonly known as: PROTONIX  Take 1 tablet (40 mg total) by mouth daily.   potassium chloride  SA 20 MEQ tablet Commonly known as: KLOR-CON  M Take 0.5 tablets (10 mEq total) by mouth daily.   Sarna lotion Generic drug: camphor-menthol Apply 1 Application topically 2 (two) times daily.   senna-docusate 8.6-50 MG tablet Commonly known as: Senokot-S Take 1 tablet by mouth at bedtime as needed for mild constipation.   spironolactone  25 MG tablet Commonly known as: ALDACTONE  Take 0.5 tablets (12.5 mg total) by mouth daily.   vitamin C  250 MG tablet Commonly known as: ASCORBIC ACID  Take 1 tablet (250 mg total) by mouth daily.        Review of Systems  Constitutional:  Negative for chills and fever.   Respiratory:  Negative for cough, shortness of breath and wheezing.   Cardiovascular:  Negative for chest pain, palpitations and leg swelling.  Gastrointestinal:  Negative for abdominal distention, abdominal pain, blood in stool, constipation, diarrhea, nausea and vomiting.  Genitourinary:  Negative for dysuria, frequency and urgency.  Neurological:  Negative for dizziness.  Psychiatric/Behavioral:  Negative for confusion.     Immunization History  Administered Date(s) Administered   Fluad Quad(high Dose 65+) 03/28/2019   H1N1 03/18/2009   Influenza, High Dose Seasonal PF 03/25/2017, 03/16/2018, 03/26/2020, 03/14/2021, 03/30/2021, 03/14/2022, 03/30/2022   Influenza,inj,quad, With Preservative 03/26/2020, 03/30/2021   Influenza-Unspecified 02/25/2011, 03/31/2012, 03/27/2013, 03/14/2014, 03/25/2016, 06/14/2017, 03/28/2019, 03/16/2023   Moderna Covid-19 Vaccine Bivalent Booster 24yrs & up 03/30/2023   Moderna SARS-COV2 Booster Vaccination 04/22/2020, 10/28/2020, 03/30/2023   Moderna Sars-Covid-2 Vaccination 06/18/2019, 07/16/2019   PFIZER(Purple Top)SARS-COV-2 Vaccination 03/03/2021   PPD Test 12/25/2020, 01/05/2021, 12/17/2021   Pfizer Covid-19 Vaccine  Bivalent Booster 8yrs & up 03/03/2021   Pneumococcal Conjugate-13 09/10/2014   Pneumococcal Polysaccharide-23 11/02/2004, 11/03/2014   Pneumococcal-Unspecified 06/14/2017   Rsv, Bivalent, Protein Subunit Rsvpref,pf (Abrysvo) 06/11/2022   Td (Adult),unspecified 12/02/2005   Tdap 11/27/2019   Unspecified SARS-COV-2 Vaccination 04/22/2020, 10/28/2020   Zoster Recombinant(Shingrix) 08/06/2020, 06/28/2022, 08/27/2022, 08/27/2022   Pertinent  Health Maintenance Due  Topic Date Due   INFLUENZA VACCINE  01/13/2024   DEXA SCAN  Completed      06/17/2022    1:45 PM 07/01/2022   11:07 AM 09/08/2022    2:58 PM 09/23/2022    3:00 PM 11/05/2022    1:51 PM  Fall Risk  Falls in the past year? 0 0 0 0 0  Was there an injury with Fall? 0 0 0 0 0   Fall Risk Category Calculator 0 0 0 0 0  Fall Risk Category (Retired) Low       (RETIRED) Patient Fall Risk Level Low fall risk       Patient at Risk for Falls Due to No Fall Risks No Fall Risks No Fall Risks No Fall Risks No Fall Risks  Fall risk Follow up Falls evaluation completed  Falls evaluation completed  Falls evaluation completed Falls evaluation completed Falls evaluation completed     Data saved with a previous flowsheet row definition   Functional Status Survey:    There were no vitals filed for this visit. There is no height or weight on file to calculate BMI. Physical Exam Constitutional:      Appearance: Normal appearance.  HENT:     Head: Normocephalic and atraumatic.   Cardiovascular:     Rate and Rhythm: Normal rate and regular rhythm.  Pulmonary:     Effort: Pulmonary effort is normal. No respiratory distress.     Breath sounds: Normal breath sounds. No wheezing.  Abdominal:     General: Bowel sounds are normal. There is no distension.     Tenderness: There is no abdominal tenderness. There is no guarding or rebound.     Comments:     Musculoskeletal:        General: Swelling present. No tenderness.     Comments: Minimal swelling    Neurological:     Mental Status: She is alert. Mental status is at baseline.     Motor: No weakness.     Labs reviewed: Recent Labs    01/16/23 2032 01/17/23 0403 01/17/23 1645 01/18/23 0419 01/19/23 0933 01/22/23 0000 01/25/23 0000 04/06/23 0000  NA 130*   < > 134* 133* 129* 129* 132* 139  K <2.0*   < > 2.9* 4.1 2.9* 4.2 4.9 4.2  CL 87*   < > 95* 93* 92* 97* 99 103  CO2 30   < > 28 29 24 20  26* 24*  GLUCOSE 106*   < > 106* 111* 125*  --   --   --   BUN 36*   < > 35* 33* 30* 29*  --  25*  CREATININE 1.95*   < > 1.80* 2.00* 1.76* 1.5* 1.6* 1.6*  CALCIUM  9.1   < > 8.5* 9.0 8.9 9.1 9.2 9.3  MG 1.9  --   --  1.7  --   --   --   --   PHOS  --   --   --  2.3*  --   --   --   --    < > = values in this interval not  displayed.  Recent Labs    01/16/23 1125 01/17/23 0403 01/25/23 0000  AST 30 31 16   ALT 13 17 13   ALKPHOS 65 54 73  BILITOT 0.5 0.6  --   PROT 6.7 5.5*  --   ALBUMIN 4.0 3.0* 3.4*   Recent Labs    01/16/23 1125 01/17/23 0403 01/18/23 0419 01/25/23 0000 04/06/23 0000 05/02/23 1018 09/13/23 1007 10/12/23 1006 11/09/23 1013  WBC 8.0 6.5 7.3 9.1 6.5  --   --   --   --   NEUTROABS 4.9  --   --  6,270.00 3,354.00  --   --   --   --   HGB 10.9* 9.8* 9.9* 9.5* 8.9*   < > 10.4* 10.4* 10.6*  HCT 31.8* 30.2* 30.4* 28* 28*  --   --   --   --   MCV 93.8 98.7 98.4  --   --   --   --   --   --   PLT 385 318 318 268 282  --   --   --   --    < > = values in this interval not displayed.   Lab Results  Component Value Date   TSH 0.862 01/16/2023   No results found for: HGBA1C Lab Results  Component Value Date   CHOL 174 01/16/2023   HDL 40 (L) 01/16/2023   LDLCALC 120 (H) 01/16/2023   TRIG 69 01/16/2023   CHOLHDL 4.4 01/16/2023    Significant Diagnostic Results in last 30 days:  DG BONE DENSITY (DXA) Result Date: 11/30/2023 EXAM: DUAL X-RAY ABSORPTIOMETRY (DXA) FOR BONE MINERAL DENSITY 11/30/2023 3:32 pm CLINICAL DATA:  89 year old Female Postmenopausal. Screening for osteoporosis History of fragility fracture. History of hip fracture. TECHNIQUE: An axial (e.g., hips, spine) and/or appendicular (e.g., radius) exam was performed, as appropriate, using GE Secretary/administrator at [employer]. Images are obtained for bone mineral density measurement and are not obtained for diagnostic purposes. [id number] Exclusions: Lumbar spine due to degenerative changes, right hip due to metal hardware COMPARISON:  None. New baseline. FINDINGS: Scan quality: Good. LEFT FEMORAL NECK: BMD (in g/cm2): 0.586 T-score: -3.3 Z-score: -0.3 LEFT TOTAL HIP: BMD (in g/cm2): 0.606 T-score: -3.2 Z-score: -0.3 LEFT FOREARM (RADIUS 33%): BMD (in g/cm2): 0.551 T-score: -3.7 Z-score: 0.1  FRAX 10-YEAR PROBABILITY OF FRACTURE: FRAX not reported as the patient's age is outside the parameters of the FRAX risk tool used by this practice. IMPRESSION: Osteoporosis based on BMD. Fracture risk is increased. Increased risk is based on low BMD, and history of hip fracture. RECOMMENDATIONS: 1. All patients should optimize calcium  and vitamin D  intake. 2. Consider FDA-approved medical therapies in postmenopausal women and men aged 74 years and older, based on the following: - A hip or vertebral (clinical or morphometric) fracture - T-score less than or equal to -2.5 and secondary causes have been excluded. - Low bone mass (T-score between -1.0 and -2.5) and a 10-year probability of a hip fracture greater than or equal to 3% or a 10-year probability of a major osteoporosis-related fracture greater than or equal to 20% based on the [place] -adapted WHO algorithm. - Clinician judgment and/or patient preferences may indicate treatment for people with 10-year fracture probabilities above or below these levels 3. Patients with diagnosis of osteoporosis or at high risk for fracture should have regular bone mineral density tests. For patients eligible for Medicare, routine testing is allowed once every 2 years. The testing frequency can be increased to  one year for patients who have rapidly progressing disease, those who are receiving or discontinuing medical therapy to restore bone mass, or have additional risk factors. Electronically Signed   By: Reyes Phi M.D.   On: 11/30/2023 16:59    Assessment/Plan  Osteoporosis Patient was on Fosamax  in the past but stopped due to worsening CKD. eGFR-30 Will get updated labs Will order BMP, calcium , vitamin D  Will check with the family if they want to start Prolia injections  Hypertension Continue with the Coreg , losartan    History of anemia No signs of bleeding continue with iron supplements   GERD Patient denies heartburn, acid reflux Continue with  pantoprazole   History of CHF Lungs clear to auscultation Mild lower extremity swelling Continue with the Lasix  All salty foods.  CKD Will check bmp Avoid nephrotoxic meds   30 min Total time spent for obtaining history,  performing a medically appropriate examination and evaluation, reviewing the tests,   documenting clinical information in the electronic or other health record,   ,care coordination (not separately reported)

## 2023-12-06 ENCOUNTER — Encounter (HOSPITAL_COMMUNITY): Payer: Self-pay

## 2023-12-06 DIAGNOSIS — R41841 Cognitive communication deficit: Secondary | ICD-10-CM | POA: Diagnosis not present

## 2023-12-06 DIAGNOSIS — I10 Essential (primary) hypertension: Secondary | ICD-10-CM | POA: Diagnosis not present

## 2023-12-06 DIAGNOSIS — E559 Vitamin D deficiency, unspecified: Secondary | ICD-10-CM | POA: Diagnosis not present

## 2023-12-06 DIAGNOSIS — N3946 Mixed incontinence: Secondary | ICD-10-CM | POA: Diagnosis not present

## 2023-12-06 DIAGNOSIS — R278 Other lack of coordination: Secondary | ICD-10-CM | POA: Diagnosis not present

## 2023-12-07 ENCOUNTER — Ambulatory Visit (HOSPITAL_COMMUNITY)
Admission: RE | Admit: 2023-12-07 | Discharge: 2023-12-07 | Disposition: A | Discharge status: Home / Self Care | Source: Ambulatory Visit | Attending: Nephrology | Admitting: Nephrology

## 2023-12-07 VITALS — BP 107/63 | HR 78 | Temp 97.8°F | Resp 16

## 2023-12-07 DIAGNOSIS — N184 Chronic kidney disease, stage 4 (severe): Secondary | ICD-10-CM | POA: Insufficient documentation

## 2023-12-07 DIAGNOSIS — R41841 Cognitive communication deficit: Secondary | ICD-10-CM | POA: Diagnosis not present

## 2023-12-07 DIAGNOSIS — D631 Anemia in chronic kidney disease: Secondary | ICD-10-CM | POA: Insufficient documentation

## 2023-12-07 DIAGNOSIS — N3946 Mixed incontinence: Secondary | ICD-10-CM | POA: Diagnosis not present

## 2023-12-07 DIAGNOSIS — R278 Other lack of coordination: Secondary | ICD-10-CM | POA: Diagnosis not present

## 2023-12-07 LAB — FERRITIN: Ferritin: 103 ng/mL (ref 11–307)

## 2023-12-07 LAB — IRON AND TIBC
Iron: 110 ug/dL (ref 28–170)
Saturation Ratios: 43 % — ABNORMAL HIGH (ref 10.4–31.8)
TIBC: 256 ug/dL (ref 250–450)
UIBC: 146 ug/dL

## 2023-12-07 LAB — POCT HEMOGLOBIN-HEMACUE: Hemoglobin: 10.5 g/dL — ABNORMAL LOW (ref 12.0–15.0)

## 2023-12-07 MED ORDER — EPOETIN ALFA-EPBX 10000 UNIT/ML IJ SOLN
INTRAMUSCULAR | Status: AC
  Filled 2023-12-07: qty 1

## 2023-12-07 MED ORDER — EPOETIN ALFA-EPBX 10000 UNIT/ML IJ SOLN
10000.0000 [IU] | Freq: Once | INTRAMUSCULAR | Status: AC
  Administered 2023-12-07: 10000 [IU] via SUBCUTANEOUS

## 2023-12-08 DIAGNOSIS — R41841 Cognitive communication deficit: Secondary | ICD-10-CM | POA: Diagnosis not present

## 2023-12-08 DIAGNOSIS — N3946 Mixed incontinence: Secondary | ICD-10-CM | POA: Diagnosis not present

## 2023-12-08 DIAGNOSIS — R278 Other lack of coordination: Secondary | ICD-10-CM | POA: Diagnosis not present

## 2023-12-09 DIAGNOSIS — N3946 Mixed incontinence: Secondary | ICD-10-CM | POA: Diagnosis not present

## 2023-12-09 DIAGNOSIS — R41841 Cognitive communication deficit: Secondary | ICD-10-CM | POA: Diagnosis not present

## 2023-12-09 DIAGNOSIS — R278 Other lack of coordination: Secondary | ICD-10-CM | POA: Diagnosis not present

## 2023-12-11 DIAGNOSIS — R278 Other lack of coordination: Secondary | ICD-10-CM | POA: Diagnosis not present

## 2023-12-11 DIAGNOSIS — N3946 Mixed incontinence: Secondary | ICD-10-CM | POA: Diagnosis not present

## 2023-12-11 DIAGNOSIS — R41841 Cognitive communication deficit: Secondary | ICD-10-CM | POA: Diagnosis not present

## 2023-12-12 ENCOUNTER — Encounter (HOSPITAL_COMMUNITY): Payer: Self-pay

## 2023-12-12 DIAGNOSIS — R41841 Cognitive communication deficit: Secondary | ICD-10-CM | POA: Diagnosis not present

## 2023-12-12 DIAGNOSIS — N3946 Mixed incontinence: Secondary | ICD-10-CM | POA: Diagnosis not present

## 2023-12-12 DIAGNOSIS — R278 Other lack of coordination: Secondary | ICD-10-CM | POA: Diagnosis not present

## 2023-12-13 DIAGNOSIS — R278 Other lack of coordination: Secondary | ICD-10-CM | POA: Diagnosis not present

## 2023-12-13 DIAGNOSIS — N3946 Mixed incontinence: Secondary | ICD-10-CM | POA: Diagnosis not present

## 2023-12-13 DIAGNOSIS — R41841 Cognitive communication deficit: Secondary | ICD-10-CM | POA: Diagnosis not present

## 2023-12-14 DIAGNOSIS — R278 Other lack of coordination: Secondary | ICD-10-CM | POA: Diagnosis not present

## 2023-12-14 DIAGNOSIS — R41841 Cognitive communication deficit: Secondary | ICD-10-CM | POA: Diagnosis not present

## 2023-12-14 DIAGNOSIS — N3946 Mixed incontinence: Secondary | ICD-10-CM | POA: Diagnosis not present

## 2023-12-15 DIAGNOSIS — R41841 Cognitive communication deficit: Secondary | ICD-10-CM | POA: Diagnosis not present

## 2023-12-15 DIAGNOSIS — N3946 Mixed incontinence: Secondary | ICD-10-CM | POA: Diagnosis not present

## 2023-12-15 DIAGNOSIS — R278 Other lack of coordination: Secondary | ICD-10-CM | POA: Diagnosis not present

## 2023-12-16 DIAGNOSIS — N3946 Mixed incontinence: Secondary | ICD-10-CM | POA: Diagnosis not present

## 2023-12-16 DIAGNOSIS — R278 Other lack of coordination: Secondary | ICD-10-CM | POA: Diagnosis not present

## 2023-12-16 DIAGNOSIS — R41841 Cognitive communication deficit: Secondary | ICD-10-CM | POA: Diagnosis not present

## 2023-12-19 ENCOUNTER — Encounter (HOSPITAL_COMMUNITY): Payer: Self-pay

## 2023-12-19 DIAGNOSIS — R41841 Cognitive communication deficit: Secondary | ICD-10-CM | POA: Diagnosis not present

## 2023-12-19 DIAGNOSIS — N3946 Mixed incontinence: Secondary | ICD-10-CM | POA: Diagnosis not present

## 2023-12-19 DIAGNOSIS — R278 Other lack of coordination: Secondary | ICD-10-CM | POA: Diagnosis not present

## 2023-12-20 DIAGNOSIS — N3946 Mixed incontinence: Secondary | ICD-10-CM | POA: Diagnosis not present

## 2023-12-20 DIAGNOSIS — R278 Other lack of coordination: Secondary | ICD-10-CM | POA: Diagnosis not present

## 2023-12-20 DIAGNOSIS — R41841 Cognitive communication deficit: Secondary | ICD-10-CM | POA: Diagnosis not present

## 2023-12-21 DIAGNOSIS — R41841 Cognitive communication deficit: Secondary | ICD-10-CM | POA: Diagnosis not present

## 2023-12-21 DIAGNOSIS — N3946 Mixed incontinence: Secondary | ICD-10-CM | POA: Diagnosis not present

## 2023-12-21 DIAGNOSIS — R278 Other lack of coordination: Secondary | ICD-10-CM | POA: Diagnosis not present

## 2023-12-22 DIAGNOSIS — R278 Other lack of coordination: Secondary | ICD-10-CM | POA: Diagnosis not present

## 2023-12-22 DIAGNOSIS — I10 Essential (primary) hypertension: Secondary | ICD-10-CM | POA: Diagnosis not present

## 2023-12-22 DIAGNOSIS — E559 Vitamin D deficiency, unspecified: Secondary | ICD-10-CM | POA: Diagnosis not present

## 2023-12-22 DIAGNOSIS — N3946 Mixed incontinence: Secondary | ICD-10-CM | POA: Diagnosis not present

## 2023-12-22 DIAGNOSIS — R41841 Cognitive communication deficit: Secondary | ICD-10-CM | POA: Diagnosis not present

## 2023-12-23 DIAGNOSIS — N3946 Mixed incontinence: Secondary | ICD-10-CM | POA: Diagnosis not present

## 2023-12-23 DIAGNOSIS — R278 Other lack of coordination: Secondary | ICD-10-CM | POA: Diagnosis not present

## 2023-12-23 DIAGNOSIS — R41841 Cognitive communication deficit: Secondary | ICD-10-CM | POA: Diagnosis not present

## 2023-12-26 ENCOUNTER — Ambulatory Visit (INDEPENDENT_AMBULATORY_CARE_PROVIDER_SITE_OTHER): Admitting: Physician Assistant

## 2023-12-26 DIAGNOSIS — M810 Age-related osteoporosis without current pathological fracture: Secondary | ICD-10-CM

## 2023-12-26 DIAGNOSIS — R278 Other lack of coordination: Secondary | ICD-10-CM | POA: Diagnosis not present

## 2023-12-26 DIAGNOSIS — R41841 Cognitive communication deficit: Secondary | ICD-10-CM | POA: Diagnosis not present

## 2023-12-26 DIAGNOSIS — N3946 Mixed incontinence: Secondary | ICD-10-CM | POA: Diagnosis not present

## 2023-12-26 NOTE — Progress Notes (Signed)
 Office Visit Note   Patient: Peggy [name]           Date of Birth: 1929/02/03           MRN: [id number] Visit Date: 12/26/2023              Requested by: Sherlynn Madden, MD [address] [place],  [place] [address] PCP: Sherlynn Madden, MD      HPI: [facility]  She has been managed by her internal medicine doctor for her osteoporosis.  She now has chronic kidney disease.  Patient's daughter was told that for insurance reasons she had to come here for Prolia  Assessment & Plan: Visit Diagnoses: Osteoporosis  Plan: Patient is currently in assisted living.  I explained to her daughter that the insurance process would be no different there and certainly it would be more convenient for her to get her injections there.  Daughter agrees may follow-up with me as needed  Follow-Up Instructions: Return if symptoms worsen or fail to improve.   Ortho Exam  Patient is alert, oriented, no adenopathy, well-dressed, normal affect, normal respiratory effort.    Imaging: No results found. No images are attached to the encounter.  Labs: Lab Results  Component Value Date   REPTSTATUS 05/24/2016 FINAL 05/19/2016   REPTSTATUS 05/24/2016 FINAL 05/19/2016   CULT  05/19/2016    NO GROWTH 5 DAYS Performed at [facility]    CULT  05/19/2016    NO GROWTH 5 DAYS Performed at [facility]    [facility] ESCHERICHIA COLI (A) 05/17/2016     Lab Results  Component Value Date   ALBUMIN 3.4 (A) 01/25/2023   ALBUMIN 3.0 (L) 01/17/2023   ALBUMIN 4.0 01/16/2023    Lab Results  Component Value Date   MG 1.7 01/18/2023   MG 1.9 01/16/2023   MG 1.8 12/03/2021   Lab Results  Component Value Date   VD25OH 108 01/22/2023   VD25OH 81.0 05/03/2022   VD25OH 53 11/29/2019    No results found for: PREALBUMIN    Latest Ref Rng & Units 12/07/2023   10:16 AM 11/09/2023   10:13 AM 10/12/2023   10:06 AM  CBC EXTENDED   Hemoglobin 12.0 - 15.0 g/dL 89.4  89.3  89.5      There is no height or weight on file to calculate BMI.  Orders:  No orders of the defined types were placed in this encounter.  No orders of the defined types were placed in this encounter.    Procedures: No procedures performed  Clinical Data: No additional findings.  ROS:  All other systems negative, except as noted in the HPI. Review of Systems  Objective: Vital Signs: There were no vitals taken for this visit.  Specialty Comments:  No specialty comments available.  PMFS History: Patient Active Problem List   Diagnosis Date Noted   Acute gastritis 05/30/2023   GERD (gastroesophageal reflux disease) 05/26/2023   Hyperlipidemia 05/26/2023   Irritability 02/03/2023   Congestive heart failure (CHF) (HCC) 01/19/2023   COVID-19 virus infection 01/19/2023   Atrial arrhythmia 01/16/2023   AKI (acute kidney injury) (HCC) 01/16/2023   Hypokalemia 01/16/2023   Hypoxia 01/16/2023   Bronchitis 09/30/2022   Hematoma of right lower leg 09/08/2022   Protein-calorie malnutrition, severe 12/02/2021   Closed right hip fracture (HCC) 11/30/2021   DNR (do not resuscitate) 11/30/2021   Osteoarthritis, multiple sites 09/24/2021   UTI (urinary tract infection) 07/30/2021  Dry skin 06/18/2021   Boxer's fracture 02/04/2021   Closed right ankle fracture 12/25/2020   Noninfected skin tear of leg, left, subsequent encounter 12/25/2020   Rash 12/23/2020   Pemphigoid 12/16/2020   Age-related osteoporosis without current pathological fracture 11/28/2020   Slow transit constipation 02/21/2020   Gait abnormality 08/31/2018   Fecal incontinence 08/31/2018   Loose stools 08/31/2018   Right shoulder pain 03/30/2018   Closed nondisp fracture of right medial malleolus with routine healing 11/10/2017   Anemia 11/10/2017   CKD (chronic kidney disease) stage 4, GFR 15-29 ml/min (HCC) 10/11/2017   Venous insufficiency (chronic) (peripheral)  08/10/2017   Incontinent of urine 08/10/2017   Malnutrition of moderate degree 05/19/2016   Hypertension 05/18/2016   Nevus of [place] 06/21/2011   Ptosis of eyebrow 06/21/2011   Past Medical History:  Diagnosis Date   Anemia    Chronic kidney disease    stage 3   GERD (gastroesophageal reflux disease)    Hypertension     Family History  Problem Relation Age of Onset   Heart failure Mother    Heart disease Father    Arthritis Sister     Past Surgical History:  Procedure Laterality Date   ORIF ANKLE FRACTURE Right 01/13/2021   Procedure: OPEN TREATMENT OF RIGHT TRIMALLEOLAR ANKLE FRACTURE WITHOUT POSTERIOR FIXATION, POSSIBLE SYNDESMOSIS;  Surgeon: Elsa Lonni SAUNDERS, MD;  Location: [facility] OR;  Service: Orthopedics;  Laterality: Right;   TOTAL HIP ARTHROPLASTY Right 12/02/2021   Procedure: TOTAL HIP ARTHROPLASTY;  Surgeon: Edna Toribio LABOR, MD;  Location: [facility] ORS;  Service: Orthopedics;  Laterality: Right;   Social History   Occupational History   Occupation: Runner, broadcasting/film/video    Comment: retired  Tobacco Use   Smoking status: Former    Current packs/day: 0.00    Types: Cigarettes    Start date: 06/14/1945    Quit date: 06/14/1957    Years since quitting: 66.5   Smokeless tobacco: Never  Vaping Use   Vaping status: Never Used  Substance and Sexual Activity   Alcohol  use: Not Currently   Drug use: No   Sexual activity: Not on file

## 2023-12-27 DIAGNOSIS — R41841 Cognitive communication deficit: Secondary | ICD-10-CM | POA: Diagnosis not present

## 2023-12-27 DIAGNOSIS — R278 Other lack of coordination: Secondary | ICD-10-CM | POA: Diagnosis not present

## 2023-12-27 DIAGNOSIS — N3946 Mixed incontinence: Secondary | ICD-10-CM | POA: Diagnosis not present

## 2023-12-28 DIAGNOSIS — R41841 Cognitive communication deficit: Secondary | ICD-10-CM | POA: Diagnosis not present

## 2023-12-28 DIAGNOSIS — R278 Other lack of coordination: Secondary | ICD-10-CM | POA: Diagnosis not present

## 2023-12-28 DIAGNOSIS — N3946 Mixed incontinence: Secondary | ICD-10-CM | POA: Diagnosis not present

## 2023-12-29 DIAGNOSIS — R278 Other lack of coordination: Secondary | ICD-10-CM | POA: Diagnosis not present

## 2023-12-29 DIAGNOSIS — R41841 Cognitive communication deficit: Secondary | ICD-10-CM | POA: Diagnosis not present

## 2023-12-29 DIAGNOSIS — N3946 Mixed incontinence: Secondary | ICD-10-CM | POA: Diagnosis not present

## 2023-12-30 DIAGNOSIS — R278 Other lack of coordination: Secondary | ICD-10-CM | POA: Diagnosis not present

## 2023-12-30 DIAGNOSIS — R41841 Cognitive communication deficit: Secondary | ICD-10-CM | POA: Diagnosis not present

## 2023-12-30 DIAGNOSIS — N3946 Mixed incontinence: Secondary | ICD-10-CM | POA: Diagnosis not present

## 2024-01-02 DIAGNOSIS — R278 Other lack of coordination: Secondary | ICD-10-CM | POA: Diagnosis not present

## 2024-01-02 DIAGNOSIS — R41841 Cognitive communication deficit: Secondary | ICD-10-CM | POA: Diagnosis not present

## 2024-01-02 DIAGNOSIS — N3946 Mixed incontinence: Secondary | ICD-10-CM | POA: Diagnosis not present

## 2024-01-03 DIAGNOSIS — N3946 Mixed incontinence: Secondary | ICD-10-CM | POA: Diagnosis not present

## 2024-01-03 DIAGNOSIS — R41841 Cognitive communication deficit: Secondary | ICD-10-CM | POA: Diagnosis not present

## 2024-01-03 DIAGNOSIS — R278 Other lack of coordination: Secondary | ICD-10-CM | POA: Diagnosis not present

## 2024-01-04 ENCOUNTER — Ambulatory Visit (HOSPITAL_COMMUNITY)
Admission: RE | Admit: 2024-01-04 | Discharge: 2024-01-04 | Disposition: A | Discharge status: Home / Self Care | Source: Ambulatory Visit | Attending: Nephrology | Admitting: Nephrology

## 2024-01-04 VITALS — BP 105/73 | HR 52 | Temp 97.2°F | Resp 16

## 2024-01-04 DIAGNOSIS — D631 Anemia in chronic kidney disease: Secondary | ICD-10-CM | POA: Diagnosis not present

## 2024-01-04 DIAGNOSIS — R278 Other lack of coordination: Secondary | ICD-10-CM | POA: Diagnosis not present

## 2024-01-04 DIAGNOSIS — R41841 Cognitive communication deficit: Secondary | ICD-10-CM | POA: Diagnosis not present

## 2024-01-04 DIAGNOSIS — N3946 Mixed incontinence: Secondary | ICD-10-CM | POA: Diagnosis not present

## 2024-01-04 DIAGNOSIS — N184 Chronic kidney disease, stage 4 (severe): Secondary | ICD-10-CM | POA: Insufficient documentation

## 2024-01-04 LAB — IRON AND TIBC
Iron: 100 ug/dL (ref 28–170)
Saturation Ratios: 37 % — ABNORMAL HIGH (ref 10.4–31.8)
TIBC: 272 ug/dL (ref 250–450)
UIBC: 172 ug/dL

## 2024-01-04 LAB — FERRITIN: Ferritin: 101 ng/mL (ref 11–307)

## 2024-01-04 LAB — POCT HEMOGLOBIN-HEMACUE: Hemoglobin: 11.4 g/dL — ABNORMAL LOW (ref 12.0–15.0)

## 2024-01-04 MED ORDER — EPOETIN ALFA-EPBX 10000 UNIT/ML IJ SOLN
10000.0000 [IU] | Freq: Once | INTRAMUSCULAR | Status: AC
  Administered 2024-01-04: 10000 [IU] via SUBCUTANEOUS

## 2024-01-04 MED ORDER — EPOETIN ALFA-EPBX 10000 UNIT/ML IJ SOLN
INTRAMUSCULAR | Status: AC
  Filled 2024-01-04: qty 1

## 2024-01-05 DIAGNOSIS — R41841 Cognitive communication deficit: Secondary | ICD-10-CM | POA: Diagnosis not present

## 2024-01-05 DIAGNOSIS — R278 Other lack of coordination: Secondary | ICD-10-CM | POA: Diagnosis not present

## 2024-01-05 DIAGNOSIS — N3946 Mixed incontinence: Secondary | ICD-10-CM | POA: Diagnosis not present

## 2024-01-06 DIAGNOSIS — R278 Other lack of coordination: Secondary | ICD-10-CM | POA: Diagnosis not present

## 2024-01-06 DIAGNOSIS — N3946 Mixed incontinence: Secondary | ICD-10-CM | POA: Diagnosis not present

## 2024-01-06 DIAGNOSIS — R41841 Cognitive communication deficit: Secondary | ICD-10-CM | POA: Diagnosis not present

## 2024-01-09 DIAGNOSIS — N3946 Mixed incontinence: Secondary | ICD-10-CM | POA: Diagnosis not present

## 2024-01-09 DIAGNOSIS — R278 Other lack of coordination: Secondary | ICD-10-CM | POA: Diagnosis not present

## 2024-01-09 DIAGNOSIS — R41841 Cognitive communication deficit: Secondary | ICD-10-CM | POA: Diagnosis not present

## 2024-01-10 DIAGNOSIS — R278 Other lack of coordination: Secondary | ICD-10-CM | POA: Diagnosis not present

## 2024-01-10 DIAGNOSIS — N3946 Mixed incontinence: Secondary | ICD-10-CM | POA: Diagnosis not present

## 2024-01-10 DIAGNOSIS — R41841 Cognitive communication deficit: Secondary | ICD-10-CM | POA: Diagnosis not present

## 2024-01-11 DIAGNOSIS — R278 Other lack of coordination: Secondary | ICD-10-CM | POA: Diagnosis not present

## 2024-01-11 DIAGNOSIS — R41841 Cognitive communication deficit: Secondary | ICD-10-CM | POA: Diagnosis not present

## 2024-01-11 DIAGNOSIS — N3946 Mixed incontinence: Secondary | ICD-10-CM | POA: Diagnosis not present

## 2024-01-12 DIAGNOSIS — N3946 Mixed incontinence: Secondary | ICD-10-CM | POA: Diagnosis not present

## 2024-01-12 DIAGNOSIS — R278 Other lack of coordination: Secondary | ICD-10-CM | POA: Diagnosis not present

## 2024-01-12 DIAGNOSIS — R41841 Cognitive communication deficit: Secondary | ICD-10-CM | POA: Diagnosis not present

## 2024-01-13 DIAGNOSIS — N3946 Mixed incontinence: Secondary | ICD-10-CM | POA: Diagnosis not present

## 2024-01-13 DIAGNOSIS — R41841 Cognitive communication deficit: Secondary | ICD-10-CM | POA: Diagnosis not present

## 2024-01-13 DIAGNOSIS — R278 Other lack of coordination: Secondary | ICD-10-CM | POA: Diagnosis not present

## 2024-01-16 DIAGNOSIS — R278 Other lack of coordination: Secondary | ICD-10-CM | POA: Diagnosis not present

## 2024-01-16 DIAGNOSIS — R41841 Cognitive communication deficit: Secondary | ICD-10-CM | POA: Diagnosis not present

## 2024-01-16 DIAGNOSIS — N3946 Mixed incontinence: Secondary | ICD-10-CM | POA: Diagnosis not present

## 2024-01-17 DIAGNOSIS — R278 Other lack of coordination: Secondary | ICD-10-CM | POA: Diagnosis not present

## 2024-01-17 DIAGNOSIS — R41841 Cognitive communication deficit: Secondary | ICD-10-CM | POA: Diagnosis not present

## 2024-01-17 DIAGNOSIS — N3946 Mixed incontinence: Secondary | ICD-10-CM | POA: Diagnosis not present

## 2024-01-18 DIAGNOSIS — N3946 Mixed incontinence: Secondary | ICD-10-CM | POA: Diagnosis not present

## 2024-01-18 DIAGNOSIS — R278 Other lack of coordination: Secondary | ICD-10-CM | POA: Diagnosis not present

## 2024-01-18 DIAGNOSIS — R41841 Cognitive communication deficit: Secondary | ICD-10-CM | POA: Diagnosis not present

## 2024-01-19 DIAGNOSIS — R278 Other lack of coordination: Secondary | ICD-10-CM | POA: Diagnosis not present

## 2024-01-19 DIAGNOSIS — N3946 Mixed incontinence: Secondary | ICD-10-CM | POA: Diagnosis not present

## 2024-01-19 DIAGNOSIS — R41841 Cognitive communication deficit: Secondary | ICD-10-CM | POA: Diagnosis not present

## 2024-01-20 DIAGNOSIS — N3946 Mixed incontinence: Secondary | ICD-10-CM | POA: Diagnosis not present

## 2024-01-20 DIAGNOSIS — R278 Other lack of coordination: Secondary | ICD-10-CM | POA: Diagnosis not present

## 2024-01-20 DIAGNOSIS — R41841 Cognitive communication deficit: Secondary | ICD-10-CM | POA: Diagnosis not present

## 2024-01-22 DIAGNOSIS — R41841 Cognitive communication deficit: Secondary | ICD-10-CM | POA: Diagnosis not present

## 2024-01-22 DIAGNOSIS — N3946 Mixed incontinence: Secondary | ICD-10-CM | POA: Diagnosis not present

## 2024-01-22 DIAGNOSIS — R278 Other lack of coordination: Secondary | ICD-10-CM | POA: Diagnosis not present

## 2024-01-23 DIAGNOSIS — R278 Other lack of coordination: Secondary | ICD-10-CM | POA: Diagnosis not present

## 2024-01-23 DIAGNOSIS — N3946 Mixed incontinence: Secondary | ICD-10-CM | POA: Diagnosis not present

## 2024-01-23 DIAGNOSIS — R41841 Cognitive communication deficit: Secondary | ICD-10-CM | POA: Diagnosis not present

## 2024-01-24 DIAGNOSIS — R278 Other lack of coordination: Secondary | ICD-10-CM | POA: Diagnosis not present

## 2024-01-24 DIAGNOSIS — R41841 Cognitive communication deficit: Secondary | ICD-10-CM | POA: Diagnosis not present

## 2024-01-24 DIAGNOSIS — N3946 Mixed incontinence: Secondary | ICD-10-CM | POA: Diagnosis not present

## 2024-01-25 DIAGNOSIS — N3946 Mixed incontinence: Secondary | ICD-10-CM | POA: Diagnosis not present

## 2024-01-25 DIAGNOSIS — R278 Other lack of coordination: Secondary | ICD-10-CM | POA: Diagnosis not present

## 2024-01-25 DIAGNOSIS — R41841 Cognitive communication deficit: Secondary | ICD-10-CM | POA: Diagnosis not present

## 2024-01-26 DIAGNOSIS — R41841 Cognitive communication deficit: Secondary | ICD-10-CM | POA: Diagnosis not present

## 2024-01-26 DIAGNOSIS — R278 Other lack of coordination: Secondary | ICD-10-CM | POA: Diagnosis not present

## 2024-01-26 DIAGNOSIS — N3946 Mixed incontinence: Secondary | ICD-10-CM | POA: Diagnosis not present

## 2024-01-27 DIAGNOSIS — R278 Other lack of coordination: Secondary | ICD-10-CM | POA: Diagnosis not present

## 2024-01-27 DIAGNOSIS — N3946 Mixed incontinence: Secondary | ICD-10-CM | POA: Diagnosis not present

## 2024-01-27 DIAGNOSIS — R41841 Cognitive communication deficit: Secondary | ICD-10-CM | POA: Diagnosis not present

## 2024-01-30 DIAGNOSIS — R278 Other lack of coordination: Secondary | ICD-10-CM | POA: Diagnosis not present

## 2024-01-30 DIAGNOSIS — N3946 Mixed incontinence: Secondary | ICD-10-CM | POA: Diagnosis not present

## 2024-01-30 DIAGNOSIS — R41841 Cognitive communication deficit: Secondary | ICD-10-CM | POA: Diagnosis not present

## 2024-01-31 DIAGNOSIS — N3946 Mixed incontinence: Secondary | ICD-10-CM | POA: Diagnosis not present

## 2024-01-31 DIAGNOSIS — R41841 Cognitive communication deficit: Secondary | ICD-10-CM | POA: Diagnosis not present

## 2024-01-31 DIAGNOSIS — R278 Other lack of coordination: Secondary | ICD-10-CM | POA: Diagnosis not present

## 2024-02-01 ENCOUNTER — Ambulatory Visit (HOSPITAL_COMMUNITY)
Admission: RE | Admit: 2024-02-01 | Discharge: 2024-02-01 | Disposition: A | Discharge status: Home / Self Care | Source: Ambulatory Visit | Attending: Nephrology | Admitting: Nephrology

## 2024-02-01 VITALS — BP 124/71 | HR 92 | Resp 16

## 2024-02-01 DIAGNOSIS — R41841 Cognitive communication deficit: Secondary | ICD-10-CM | POA: Diagnosis not present

## 2024-02-01 DIAGNOSIS — N3946 Mixed incontinence: Secondary | ICD-10-CM | POA: Diagnosis not present

## 2024-02-01 DIAGNOSIS — N184 Chronic kidney disease, stage 4 (severe): Secondary | ICD-10-CM | POA: Insufficient documentation

## 2024-02-01 DIAGNOSIS — D631 Anemia in chronic kidney disease: Secondary | ICD-10-CM | POA: Diagnosis not present

## 2024-02-01 DIAGNOSIS — R278 Other lack of coordination: Secondary | ICD-10-CM | POA: Diagnosis not present

## 2024-02-01 LAB — FERRITIN: Ferritin: 134 ng/mL (ref 11–307)

## 2024-02-01 LAB — IRON AND TIBC
Iron: 60 ug/dL (ref 28–170)
Saturation Ratios: 24 % (ref 10.4–31.8)
TIBC: 249 ug/dL — ABNORMAL LOW (ref 250–450)
UIBC: 189 ug/dL

## 2024-02-01 LAB — POCT HEMOGLOBIN-HEMACUE: Hemoglobin: 10.7 g/dL — ABNORMAL LOW (ref 12.0–15.0)

## 2024-02-01 MED ORDER — EPOETIN ALFA-EPBX 10000 UNIT/ML IJ SOLN
INTRAMUSCULAR | Status: AC
  Filled 2024-02-01: qty 1

## 2024-02-01 MED ORDER — EPOETIN ALFA-EPBX 10000 UNIT/ML IJ SOLN
10000.0000 [IU] | Freq: Once | INTRAMUSCULAR | Status: AC
  Administered 2024-02-01: 10000 [IU] via SUBCUTANEOUS

## 2024-02-02 DIAGNOSIS — R41841 Cognitive communication deficit: Secondary | ICD-10-CM | POA: Diagnosis not present

## 2024-02-02 DIAGNOSIS — R278 Other lack of coordination: Secondary | ICD-10-CM | POA: Diagnosis not present

## 2024-02-02 DIAGNOSIS — N3946 Mixed incontinence: Secondary | ICD-10-CM | POA: Diagnosis not present

## 2024-02-03 DIAGNOSIS — N3946 Mixed incontinence: Secondary | ICD-10-CM | POA: Diagnosis not present

## 2024-02-03 DIAGNOSIS — R278 Other lack of coordination: Secondary | ICD-10-CM | POA: Diagnosis not present

## 2024-02-03 DIAGNOSIS — R41841 Cognitive communication deficit: Secondary | ICD-10-CM | POA: Diagnosis not present

## 2024-02-04 DIAGNOSIS — N3946 Mixed incontinence: Secondary | ICD-10-CM | POA: Diagnosis not present

## 2024-02-04 DIAGNOSIS — R41841 Cognitive communication deficit: Secondary | ICD-10-CM | POA: Diagnosis not present

## 2024-02-04 DIAGNOSIS — R278 Other lack of coordination: Secondary | ICD-10-CM | POA: Diagnosis not present

## 2024-02-06 DIAGNOSIS — R278 Other lack of coordination: Secondary | ICD-10-CM | POA: Diagnosis not present

## 2024-02-06 DIAGNOSIS — R41841 Cognitive communication deficit: Secondary | ICD-10-CM | POA: Diagnosis not present

## 2024-02-06 DIAGNOSIS — N3946 Mixed incontinence: Secondary | ICD-10-CM | POA: Diagnosis not present

## 2024-02-07 DIAGNOSIS — D485 Neoplasm of uncertain behavior of skin: Secondary | ICD-10-CM | POA: Diagnosis not present

## 2024-02-07 DIAGNOSIS — N3946 Mixed incontinence: Secondary | ICD-10-CM | POA: Diagnosis not present

## 2024-02-07 DIAGNOSIS — L814 Other melanin hyperpigmentation: Secondary | ICD-10-CM | POA: Diagnosis not present

## 2024-02-07 DIAGNOSIS — L120 Bullous pemphigoid: Secondary | ICD-10-CM | POA: Diagnosis not present

## 2024-02-07 DIAGNOSIS — R41841 Cognitive communication deficit: Secondary | ICD-10-CM | POA: Diagnosis not present

## 2024-02-07 DIAGNOSIS — D0472 Carcinoma in situ of skin of left lower limb, including hip: Secondary | ICD-10-CM | POA: Diagnosis not present

## 2024-02-07 DIAGNOSIS — R278 Other lack of coordination: Secondary | ICD-10-CM | POA: Diagnosis not present

## 2024-02-08 DIAGNOSIS — N3946 Mixed incontinence: Secondary | ICD-10-CM | POA: Diagnosis not present

## 2024-02-08 DIAGNOSIS — R278 Other lack of coordination: Secondary | ICD-10-CM | POA: Diagnosis not present

## 2024-02-08 DIAGNOSIS — R41841 Cognitive communication deficit: Secondary | ICD-10-CM | POA: Diagnosis not present

## 2024-02-09 ENCOUNTER — Encounter: Payer: Self-pay | Admitting: Nurse Practitioner

## 2024-02-09 ENCOUNTER — Non-Acute Institutional Stay: Payer: Self-pay | Admitting: Nurse Practitioner

## 2024-02-09 DIAGNOSIS — Z66 Do not resuscitate: Secondary | ICD-10-CM

## 2024-02-09 DIAGNOSIS — N3942 Incontinence without sensory awareness: Secondary | ICD-10-CM

## 2024-02-09 DIAGNOSIS — K4090 Unilateral inguinal hernia, without obstruction or gangrene, not specified as recurrent: Secondary | ICD-10-CM

## 2024-02-09 DIAGNOSIS — D649 Anemia, unspecified: Secondary | ICD-10-CM | POA: Diagnosis not present

## 2024-02-09 DIAGNOSIS — N184 Chronic kidney disease, stage 4 (severe): Secondary | ICD-10-CM

## 2024-02-09 DIAGNOSIS — R41841 Cognitive communication deficit: Secondary | ICD-10-CM | POA: Diagnosis not present

## 2024-02-09 DIAGNOSIS — M810 Age-related osteoporosis without current pathological fracture: Secondary | ICD-10-CM

## 2024-02-09 DIAGNOSIS — I10 Essential (primary) hypertension: Secondary | ICD-10-CM

## 2024-02-09 DIAGNOSIS — K5901 Slow transit constipation: Secondary | ICD-10-CM

## 2024-02-09 DIAGNOSIS — I498 Other specified cardiac arrhythmias: Secondary | ICD-10-CM | POA: Diagnosis not present

## 2024-02-09 DIAGNOSIS — K219 Gastro-esophageal reflux disease without esophagitis: Secondary | ICD-10-CM | POA: Diagnosis not present

## 2024-02-09 DIAGNOSIS — I509 Heart failure, unspecified: Secondary | ICD-10-CM | POA: Diagnosis not present

## 2024-02-09 DIAGNOSIS — R278 Other lack of coordination: Secondary | ICD-10-CM | POA: Diagnosis not present

## 2024-02-09 DIAGNOSIS — L129 Pemphigoid, unspecified: Secondary | ICD-10-CM | POA: Diagnosis not present

## 2024-02-09 DIAGNOSIS — E785 Hyperlipidemia, unspecified: Secondary | ICD-10-CM

## 2024-02-09 DIAGNOSIS — N3946 Mixed incontinence: Secondary | ICD-10-CM | POA: Diagnosis not present

## 2024-02-09 NOTE — Progress Notes (Unsigned)
 Location:  [facility] Room Number: AL 826-A Place of Service:  ALF (13) Provider:  ManXie Amori Colomb, N.P.  Patient Care Team: Sherlynn Madden, MD as PCP - General (Internal Medicine) Lonni Slain, MD as PCP - Cardiology (Cardiology)  Extended Emergency Contact Information Primary Emergency Contact: Lundblad,[name] Address: [address]          [place], [place] [address] [place] Home Phone: [phone] Work Phone: [phone] Mobile Phone: [phone] Relation: Daughter Secondary Emergency Contact: [name],[name] Home Phone: [phone] Relation: Daughter  Code Status:  DNR Goals of care: Advanced Directive information    12/05/2023   11:39 AM  Advanced Directives  Does Patient Have a Medical Advance Directive? Yes  Type of Estate agent of [place];Out of facility DNR (pink MOST or yellow form)  Does patient want to make changes to medical advance directive? No - Patient declined  Copy of Healthcare Power of Attorney in Chart? Yes - validated most recent copy scanned in chart (See row information)  Pre-existing out of facility DNR order (yellow form or pink MOST form) Pink MOST form placed in chart (order not valid for inpatient use)     Chief Complaint  Patient presents with  . Groin Nodule    Patient with raised area on right side of groin     HPI:  Pt is a 89 y.o. female seen today for an acute visit for reported a bulging painless lump in right groin.   New onset Afib,01/2023,  cardiology: no evidence of Afib, probably wandering atrial pacer,  CHF- BNP 265, EF 40-45%, started on Carvedilol , Losartan , Aldactone , ASA, resumed Furosemide  and off  Metolazone , Amlodipine .              Emotional outburst, better             Hypokalemia, K 4.2 04/06/23 Pemphigoid, legs, responded steroid, on Doxy per dermatology Anemia, Vit B12 2430, Folate >40 01/16/23,  EPO 9.9 09/15/21, On Fe(Iron 105  06/20/23),  chronic, on Pantoprazole  for GI protection, FOBT negative. Iron 60, Hgb  10.7 02/01/24 GERD, stable, on Pantoprazole .  CHF/Edema BLE, chronic, on Aldactoneand Furosemide , followed by Cardiology. 01/2023 BNP 265, EF 40-45%, started on Carvedilol , Losartan , Aldactone , resumed Furosemide  and off  Metolazone , Amlodipine .  CKD Bun/creat 24/1.63 09/20/23, saw nephrology, Raymond Kidney OA, Hx of R ankle fx, R hip total hip arthroplasty 11/30/21, ambulates with walker, risk of falling             Her urinary frequency/leakage, uses pads, it seems better since taking Myrbetriq  in pm per Urology             HTN, blood pressure is controlled, off Amlodipine , on Losartan , Carvedilol , Furosemide , Spironolactone .             Osteoporosis, off Alendronate  due to CKD, 11/05/20 DEXA t score -2.8, desires delaying DEXA             Fecal incontinence prn Imodium              Constipation, taking Senna             HLD, taking Atorvastatin .         Past Medical History:  Diagnosis Date  . Anemia   . Chronic kidney disease    stage 3  . GERD (gastroesophageal reflux disease)   . Hypertension    Past Surgical History:  Procedure Laterality Date  . ORIF ANKLE FRACTURE Right 01/13/2021   Procedure: OPEN TREATMENT  OF RIGHT TRIMALLEOLAR ANKLE FRACTURE WITHOUT POSTERIOR FIXATION, POSSIBLE SYNDESMOSIS;  Surgeon: Elsa Lonni SAUNDERS, MD;  Location: [facility] OR;  Service: Orthopedics;  Laterality: Right;  . TOTAL HIP ARTHROPLASTY Right 12/02/2021   Procedure: TOTAL HIP ARTHROPLASTY;  Surgeon: Edna Toribio LABOR, MD;  Location: [facility] ORS;  Service: Orthopedics;  Laterality: Right;    Allergies  Allergen Reactions  . Penicillins Swelling and Other (See Comments)    Facial swelling; can tolerate cephalosporins   . Ace Inhibitors Other (See Comments)    Reaction unknown  . Flagyl [Metronidazole] Other (See Comments)    Reaction unknown    Outpatient Encounter Medications as of 02/09/2024  Medication Sig  .  acetaminophen  (TYLENOL ) 325 MG tablet Take 2 tablets (650 mg total) by mouth every 6 (six) hours as needed for mild pain, fever or headache.  . aspirin  EC 81 MG tablet Take 81 mg by mouth daily. Monday, Wednesday and Friday  . atorvastatin  (LIPITOR) 20 MG tablet Take 20 mg by mouth daily.  . camphor-menthol (SARNA) lotion Apply 1 Application topically 2 (two) times daily.  . carvedilol  (COREG ) 6.25 MG tablet Take 6.25 mg by mouth 2 (two) times daily with a meal.  . cholecalciferol (VITAMIN D3) 25 MCG (1000 UNIT) tablet Take 1,000 Units by mouth every Monday, Wednesday, and Friday.  . denosumab (PROLIA) 60 MG/ML SOSY injection Inject 60 mg into the skin every 6 (six) months.  . doxycycline  (VIBRAMYCIN ) 50 MG capsule Take 50 mg by mouth daily.  . ferrous sulfate  (FEROSUL) 325 (65 FE) MG tablet Take 1 tablet (325 mg total) by mouth daily with breakfast. MON, WED, FRI  . furosemide  (LASIX ) 40 MG tablet Take 40 mg by mouth daily.  . guaiFENesin -dextromethorphan (ROBITUSSIN DM) 100-10 MG/5ML syrup Take 10 mLs by mouth every 6 (six) hours as needed for cough.  . lactose free nutrition (BOOST) LIQD Take 237 mLs by mouth daily.  . losartan  (COZAAR ) 25 MG tablet Take 1 tablet (25 mg total) by mouth daily.  . melatonin 3 MG TABS tablet Take 1 tablet (3 mg total) by mouth at bedtime.  . Multiple Vitamins-Minerals (ICAPS AREDS 2 PO) Take 1 capsule by mouth daily with breakfast.  . MYRBETRIQ  25 MG TB24 tablet TAKE 2 TABLETS BY MOUTH ONCE  DAILY  . pantoprazole  (PROTONIX ) 40 MG tablet Take 1 tablet (40 mg total) by mouth daily.  . potassium chloride  SA (KLOR-CON  M) 20 MEQ tablet Take 0.5 tablets (10 mEq total) by mouth daily.  SABRA senna-docusate (SENOKOT-S) 8.6-50 MG tablet Take 1 tablet by mouth at bedtime as needed for mild constipation.  . spironolactone  (ALDACTONE ) 25 MG tablet Take 0.5 tablets (12.5 mg total) by mouth daily.  . triamcinolone  cream (KENALOG ) 0.1 % Apply 1 Application topically 2 (two) times  daily. Every 12 hours for rash  . vitamin C  (ASCORBIC ACID ) 250 MG tablet Take 1 tablet (250 mg total) by mouth daily.  . [DISCONTINUED] atorvastatin  (LIPITOR) 20 MG tablet Take 4 tablets (80 mg total) by mouth daily. (Patient not taking: Reported on 02/09/2024)  . [DISCONTINUED] fluorouracil (EFUDEX) 5 % cream Apply 1 Application topically daily. (Patient not taking: Reported on 02/09/2024)  . [DISCONTINUED] furosemide  (LASIX ) 20 MG tablet Take 2 tablets (40 mg total) by mouth daily as needed for edema or fluid. (Patient not taking: Reported on 02/09/2024)   No facility-administered encounter medications on file as of 02/09/2024.    Review of Systems  Immunization History  Administered Date(s) Administered  .  sv, Bivalent,  Protein Subunit Rsvpref,pf [place]) 06/11/2022  . Fluad Quad(high Dose 65+) 03/28/2019  . H1N1 03/18/2009  . INFLUENZA, HIGH DOSE SEASONAL PF 03/25/2017, 03/16/2018, 03/26/2020, 03/14/2021, 03/30/2021, 03/14/2022, 03/30/2022  . Influenza,inj,quad, With Preservative 03/26/2020, 03/30/2021  . Influenza-Unspecified 02/25/2011, 03/31/2012, 03/27/2013, 03/14/2014, 03/25/2016, 06/14/2017, 03/28/2019, 03/16/2023  . Moderna Covid-19 Vaccine Bivalent Booster 29yrs & up 03/30/2023  . Moderna SARS-COV2 Booster Vaccination 04/22/2020, 10/28/2020, 03/30/2023  . Moderna Sars-Covid-2 Vaccination 06/18/2019, 07/16/2019  . PFIZER(Purple Top)SARS-COV-2 Vaccination 03/03/2021  . PPD Test 12/25/2020, 01/05/2021, 12/17/2021  . Pfizer Covid-19 Vaccine Bivalent Booster 37yrs & up 03/03/2021  . Pneumococcal Conjugate-13 09/10/2014  . Pneumococcal Polysaccharide-23 11/02/2004, 11/03/2014  . Pneumococcal-Unspecified 06/14/2017  . Td (Adult),unspecified 12/02/2005  . Tdap 11/27/2019  . Unspecified SARS-COV-2 Vaccination 04/22/2020, 10/28/2020  . Zoster Recombinant(Shingrix) 08/06/2020, 06/28/2022, 08/27/2022, 08/27/2022   Pertinent  Health Maintenance Due  Topic Date Due  . INFLUENZA  VACCINE  03/08/2024 (Originally 01/13/2024)  . DEXA SCAN  Completed      06/17/2022    1:45 PM 07/01/2022   11:07 AM 09/08/2022    2:58 PM 09/23/2022    3:00 PM 11/05/2022    1:51 PM  Fall Risk  Falls in the past year? 0 0 0 0 0  Was there an injury with Fall? 0 0 0 0 0  Fall Risk Category Calculator 0 0 0 0 0  Fall Risk Category (Retired) Low       (RETIRED) Patient Fall Risk Level Low fall risk       Patient at Risk for Falls Due to No Fall Risks No Fall Risks No Fall Risks No Fall Risks No Fall Risks  Fall risk Follow up Falls evaluation completed  Falls evaluation completed  Falls evaluation completed Falls evaluation completed Falls evaluation completed     Data saved with a previous flowsheet row definition   Functional Status Survey:    Vitals:   02/09/24 1113  BP: (!) 140/82  Pulse: 81  Weight: 122 lb (55.3 kg)  Height: 5' 5 (1.651 m)   Body mass index is 20.3 kg/m. Physical Exam  Labs reviewed: Recent Labs    04/06/23 0000  NA 139  K 4.2  CL 103  CO2 24*  BUN 25*  CREATININE 1.6*  CALCIUM  9.3   No results for input(s): AST, ALT, ALKPHOS, BILITOT, PROT, ALBUMIN in the last 8760 hours. Recent Labs    04/06/23 0000 05/02/23 1018 12/07/23 1016 01/04/24 1027 02/01/24 0958  WBC 6.5  --   --   --   --   NEUTROABS 3,354.00  --   --   --   --   HGB 8.9*   < > 10.5* 11.4* 10.7*  HCT 28*  --   --   --   --   PLT 282  --   --   --   --    < > = values in this interval not displayed.   Lab Results  Component Value Date   TSH 0.862 01/16/2023   No results found for: HGBA1C Lab Results  Component Value Date   CHOL 174 01/16/2023   HDL 40 (L) 01/16/2023   LDLCALC 120 (H) 01/16/2023   TRIG 69 01/16/2023   CHOLHDL 4.4 01/16/2023    Significant Diagnostic Results in last 30 days:  No results found.  Assessment/Plan Inguinal hernia, right a bulging painless lump in right groin. Reduced when the patient in supine position. No pain or GI  symptoms. Observe.  Family/ staff Communication: plan of care reviewed with the patient and charge nurse.   Labs/tests ordered:  none

## 2024-02-09 NOTE — Assessment & Plan Note (Signed)
 a bulging painless lump in right groin. Reduced when the patient in supine position. No pain or GI symptoms. Observe.

## 2024-02-10 DIAGNOSIS — N3946 Mixed incontinence: Secondary | ICD-10-CM | POA: Diagnosis not present

## 2024-02-10 DIAGNOSIS — R41841 Cognitive communication deficit: Secondary | ICD-10-CM | POA: Diagnosis not present

## 2024-02-10 DIAGNOSIS — R278 Other lack of coordination: Secondary | ICD-10-CM | POA: Diagnosis not present

## 2024-02-10 NOTE — Assessment & Plan Note (Signed)
 Started Prolia recently, continue vitamin D 

## 2024-02-10 NOTE — Assessment & Plan Note (Signed)
 Compensated clinically, trace edema BLE, continue furosemide , Aldactone , carvedilol , losartan 

## 2024-02-10 NOTE — Assessment & Plan Note (Signed)
 New onset 02/01/2023, underwent cardiology evaluation indicating no evidence of A-fib, probably wandering atrial pacer

## 2024-02-10 NOTE — Assessment & Plan Note (Signed)
 Stable, continue pantoprazole.

## 2024-02-10 NOTE — Assessment & Plan Note (Signed)
 Stable, followed by nephrology, baseline creatinine 1.5-1.9

## 2024-02-10 NOTE — Assessment & Plan Note (Signed)
 Stable currently, continue doxycycline  per dermatology

## 2024-02-10 NOTE — Assessment & Plan Note (Signed)
 Stable

## 2024-02-10 NOTE — Assessment & Plan Note (Signed)
No change, continue Myrbetriq.

## 2024-02-10 NOTE — Assessment & Plan Note (Signed)
 Continue atorvastatin 

## 2024-02-10 NOTE — Assessment & Plan Note (Signed)
 Stable, iron 60, Hgb 10.78 2025, continue iron supplement

## 2024-02-10 NOTE — Assessment & Plan Note (Signed)
 Blood pressure is controlled, continue losartan , carvedilol , furosemide , and spironolactone 

## 2024-02-13 DIAGNOSIS — R41841 Cognitive communication deficit: Secondary | ICD-10-CM | POA: Diagnosis not present

## 2024-02-13 DIAGNOSIS — R278 Other lack of coordination: Secondary | ICD-10-CM | POA: Diagnosis not present

## 2024-02-13 DIAGNOSIS — N3946 Mixed incontinence: Secondary | ICD-10-CM | POA: Diagnosis not present

## 2024-02-14 DIAGNOSIS — R278 Other lack of coordination: Secondary | ICD-10-CM | POA: Diagnosis not present

## 2024-02-14 DIAGNOSIS — N3946 Mixed incontinence: Secondary | ICD-10-CM | POA: Diagnosis not present

## 2024-02-14 DIAGNOSIS — R41841 Cognitive communication deficit: Secondary | ICD-10-CM | POA: Diagnosis not present

## 2024-02-15 DIAGNOSIS — N3946 Mixed incontinence: Secondary | ICD-10-CM | POA: Diagnosis not present

## 2024-02-15 DIAGNOSIS — R278 Other lack of coordination: Secondary | ICD-10-CM | POA: Diagnosis not present

## 2024-02-15 DIAGNOSIS — R41841 Cognitive communication deficit: Secondary | ICD-10-CM | POA: Diagnosis not present

## 2024-02-16 DIAGNOSIS — R278 Other lack of coordination: Secondary | ICD-10-CM | POA: Diagnosis not present

## 2024-02-16 DIAGNOSIS — R41841 Cognitive communication deficit: Secondary | ICD-10-CM | POA: Diagnosis not present

## 2024-02-16 DIAGNOSIS — N3946 Mixed incontinence: Secondary | ICD-10-CM | POA: Diagnosis not present

## 2024-02-17 DIAGNOSIS — R41841 Cognitive communication deficit: Secondary | ICD-10-CM | POA: Diagnosis not present

## 2024-02-17 DIAGNOSIS — N3946 Mixed incontinence: Secondary | ICD-10-CM | POA: Diagnosis not present

## 2024-02-17 DIAGNOSIS — R278 Other lack of coordination: Secondary | ICD-10-CM | POA: Diagnosis not present

## 2024-02-20 DIAGNOSIS — R41841 Cognitive communication deficit: Secondary | ICD-10-CM | POA: Diagnosis not present

## 2024-02-20 DIAGNOSIS — R278 Other lack of coordination: Secondary | ICD-10-CM | POA: Diagnosis not present

## 2024-02-20 DIAGNOSIS — N3946 Mixed incontinence: Secondary | ICD-10-CM | POA: Diagnosis not present

## 2024-02-21 DIAGNOSIS — R278 Other lack of coordination: Secondary | ICD-10-CM | POA: Diagnosis not present

## 2024-02-21 DIAGNOSIS — R41841 Cognitive communication deficit: Secondary | ICD-10-CM | POA: Diagnosis not present

## 2024-02-21 DIAGNOSIS — N3946 Mixed incontinence: Secondary | ICD-10-CM | POA: Diagnosis not present

## 2024-02-22 ENCOUNTER — Telehealth (HOSPITAL_COMMUNITY): Payer: Self-pay | Admitting: Pharmacy Technician

## 2024-02-22 DIAGNOSIS — N184 Chronic kidney disease, stage 4 (severe): Secondary | ICD-10-CM | POA: Insufficient documentation

## 2024-02-22 DIAGNOSIS — N3946 Mixed incontinence: Secondary | ICD-10-CM | POA: Diagnosis not present

## 2024-02-22 DIAGNOSIS — R41841 Cognitive communication deficit: Secondary | ICD-10-CM | POA: Diagnosis not present

## 2024-02-22 DIAGNOSIS — R278 Other lack of coordination: Secondary | ICD-10-CM | POA: Diagnosis not present

## 2024-02-22 NOTE — Telephone Encounter (Signed)
 Auth Submission: NO AUTH NEEDED Site of care: [facility] Payer: Medicare A/B, BCBS Supp   Medication & CPT/J Code(s) submitted: Q5106 RETACRIT  Diagnosis Code: N18.4 Route of submission (phone, fax, portal):  Phone # Fax # Auth type: Buy/Bill HB Units/visits requested: 10000 units q 28 days Reference number:  Approval from: 02/22/24 to 06/13/24    Dagoberto Armour, CPhT [facility] Phone: [phone] 02/22/2024

## 2024-02-23 DIAGNOSIS — R278 Other lack of coordination: Secondary | ICD-10-CM | POA: Diagnosis not present

## 2024-02-23 DIAGNOSIS — N3946 Mixed incontinence: Secondary | ICD-10-CM | POA: Diagnosis not present

## 2024-02-23 DIAGNOSIS — R41841 Cognitive communication deficit: Secondary | ICD-10-CM | POA: Diagnosis not present

## 2024-02-24 DIAGNOSIS — N3946 Mixed incontinence: Secondary | ICD-10-CM | POA: Diagnosis not present

## 2024-02-24 DIAGNOSIS — R278 Other lack of coordination: Secondary | ICD-10-CM | POA: Diagnosis not present

## 2024-02-24 DIAGNOSIS — R41841 Cognitive communication deficit: Secondary | ICD-10-CM | POA: Diagnosis not present

## 2024-02-27 DIAGNOSIS — R278 Other lack of coordination: Secondary | ICD-10-CM | POA: Diagnosis not present

## 2024-02-27 DIAGNOSIS — N3946 Mixed incontinence: Secondary | ICD-10-CM | POA: Diagnosis not present

## 2024-02-27 DIAGNOSIS — R41841 Cognitive communication deficit: Secondary | ICD-10-CM | POA: Diagnosis not present

## 2024-02-28 DIAGNOSIS — R41841 Cognitive communication deficit: Secondary | ICD-10-CM | POA: Diagnosis not present

## 2024-02-28 DIAGNOSIS — R278 Other lack of coordination: Secondary | ICD-10-CM | POA: Diagnosis not present

## 2024-02-28 DIAGNOSIS — N3946 Mixed incontinence: Secondary | ICD-10-CM | POA: Diagnosis not present

## 2024-02-29 ENCOUNTER — Ambulatory Visit (HOSPITAL_COMMUNITY)
Admission: RE | Admit: 2024-02-29 | Discharge: 2024-02-29 | Disposition: A | Discharge status: Home / Self Care | Source: Ambulatory Visit | Attending: Nephrology | Admitting: Nephrology

## 2024-02-29 VITALS — BP 132/85 | HR 90 | Temp 97.0°F | Resp 16

## 2024-02-29 DIAGNOSIS — D631 Anemia in chronic kidney disease: Secondary | ICD-10-CM | POA: Insufficient documentation

## 2024-02-29 DIAGNOSIS — R41841 Cognitive communication deficit: Secondary | ICD-10-CM | POA: Diagnosis not present

## 2024-02-29 DIAGNOSIS — N184 Chronic kidney disease, stage 4 (severe): Secondary | ICD-10-CM | POA: Insufficient documentation

## 2024-02-29 DIAGNOSIS — R278 Other lack of coordination: Secondary | ICD-10-CM | POA: Diagnosis not present

## 2024-02-29 DIAGNOSIS — N3946 Mixed incontinence: Secondary | ICD-10-CM | POA: Diagnosis not present

## 2024-02-29 LAB — IRON AND TIBC
Iron: 93 ug/dL (ref 28–170)
Saturation Ratios: 35 % — ABNORMAL HIGH (ref 10.4–31.8)
TIBC: 265 ug/dL (ref 250–450)
UIBC: 172 ug/dL

## 2024-02-29 LAB — FERRITIN: Ferritin: 117 ng/mL (ref 11–307)

## 2024-02-29 MED ORDER — CLONIDINE HCL 0.1 MG PO TABS: 0.1000 mg | ORAL_TABLET | ORAL | Status: DC | PRN

## 2024-02-29 MED ORDER — EPOETIN ALFA-EPBX 10000 UNIT/ML IJ SOLN
INTRAMUSCULAR | Status: AC
  Filled 2024-02-29: qty 1

## 2024-02-29 MED ORDER — EPOETIN ALFA-EPBX 10000 UNIT/ML IJ SOLN
10000.0000 [IU] | Freq: Once | INTRAMUSCULAR | Status: AC
  Administered 2024-02-29: 10000 [IU] via SUBCUTANEOUS

## 2024-03-01 DIAGNOSIS — R278 Other lack of coordination: Secondary | ICD-10-CM | POA: Diagnosis not present

## 2024-03-01 DIAGNOSIS — R41841 Cognitive communication deficit: Secondary | ICD-10-CM | POA: Diagnosis not present

## 2024-03-01 DIAGNOSIS — N3946 Mixed incontinence: Secondary | ICD-10-CM | POA: Diagnosis not present

## 2024-03-01 LAB — POCT HEMOGLOBIN-HEMACUE: Hemoglobin: 11.4 g/dL — ABNORMAL LOW (ref 12.0–15.0)

## 2024-03-02 DIAGNOSIS — R41841 Cognitive communication deficit: Secondary | ICD-10-CM | POA: Diagnosis not present

## 2024-03-02 DIAGNOSIS — N3946 Mixed incontinence: Secondary | ICD-10-CM | POA: Diagnosis not present

## 2024-03-02 DIAGNOSIS — R278 Other lack of coordination: Secondary | ICD-10-CM | POA: Diagnosis not present

## 2024-03-05 ENCOUNTER — Encounter: Payer: Self-pay | Admitting: Sports Medicine

## 2024-03-05 ENCOUNTER — Non-Acute Institutional Stay: Payer: Self-pay | Admitting: Sports Medicine

## 2024-03-05 DIAGNOSIS — M810 Age-related osteoporosis without current pathological fracture: Secondary | ICD-10-CM

## 2024-03-05 DIAGNOSIS — R32 Unspecified urinary incontinence: Secondary | ICD-10-CM

## 2024-03-05 DIAGNOSIS — I10 Essential (primary) hypertension: Secondary | ICD-10-CM | POA: Diagnosis not present

## 2024-03-05 DIAGNOSIS — R41841 Cognitive communication deficit: Secondary | ICD-10-CM | POA: Diagnosis not present

## 2024-03-05 DIAGNOSIS — N1832 Chronic kidney disease, stage 3b: Secondary | ICD-10-CM | POA: Diagnosis not present

## 2024-03-05 DIAGNOSIS — N3946 Mixed incontinence: Secondary | ICD-10-CM | POA: Diagnosis not present

## 2024-03-05 DIAGNOSIS — E785 Hyperlipidemia, unspecified: Secondary | ICD-10-CM | POA: Diagnosis not present

## 2024-03-05 DIAGNOSIS — I509 Heart failure, unspecified: Secondary | ICD-10-CM | POA: Diagnosis not present

## 2024-03-05 DIAGNOSIS — R278 Other lack of coordination: Secondary | ICD-10-CM | POA: Diagnosis not present

## 2024-03-05 NOTE — Progress Notes (Signed)
 Provider:  Dr. Jackalyn Blazing Location:  [facility] Place of Service:   Assisted living    PCP: Blazing Jackalyn, MD Patient Care Team: Blazing Jackalyn, MD as PCP - General (Internal Medicine) Lonni Slain, MD as PCP - Cardiology (Cardiology)  Extended Emergency Contact Information Primary Emergency Contact: Malburg,[name] Address: [address]          [place], [place] [address] [place] Home Phone: [phone] Work Phone: [phone] Mobile Phone: [phone] Relation: Daughter Secondary Emergency Contact: [name],[name] Home Phone: [phone] Relation: Daughter  Goals of Care: Advanced Directive information    12/05/2023   11:39 AM  Advanced Directives  Does Patient Have a Medical Advance Directive? Yes  Type of Estate agent of [place];Out of facility DNR (pink MOST or yellow form)  Does patient want to make changes to medical advance directive? No - Patient declined  Copy of Healthcare Power of Attorney in Chart? Yes - validated most recent copy scanned in chart (See row information)  Pre-existing out of facility DNR order (yellow form or pink MOST form) Pink MOST form placed in chart (order not valid for inpatient use)        History of Present Illness  89 yr old F with h/o afib, GERD, Anemia, CHF, CKD, Osteoporosis, HLD, pemphigoid is seen today for chronic disease management. Pt seen and examined in her room , she is organizing her mail.  She ambulates with walker, goes to dining room to eat her meals. Pt seems pleasant and comfortable and does not appear to be in distress  Denies cough, runny nose, post nasal drip, chest pain, palpitations, SOB, abdominal pain, nausea, vomiting, dysuria, hematuria, bloody or dark stools.   As per nursing staff  pt has intermittent confusion, needs assistance with bathing. No behavioral problems reported.     Past Medical History:   Diagnosis Date   Anemia    Chronic kidney disease    stage 3   GERD (gastroesophageal reflux disease)    Hypertension    Past Surgical History:  Procedure Laterality Date   ORIF ANKLE FRACTURE Right 01/13/2021   Procedure: OPEN TREATMENT OF RIGHT TRIMALLEOLAR ANKLE FRACTURE WITHOUT POSTERIOR FIXATION, POSSIBLE SYNDESMOSIS;  Surgeon: Elsa Lonni SAUNDERS, MD;  Location: [facility] OR;  Service: Orthopedics;  Laterality: Right;   TOTAL HIP ARTHROPLASTY Right 12/02/2021   Procedure: TOTAL HIP ARTHROPLASTY;  Surgeon: Edna Toribio LABOR, MD;  Location: [facility] ORS;  Service: Orthopedics;  Laterality: Right;    reports that she quit smoking about 66 years ago. Her smoking use included cigarettes. She started smoking about 78 years ago. She has never used smokeless tobacco. She reports that she does not currently use alcohol . She reports that she does not use drugs. Social History   Socioeconomic History   Marital status: Widowed    Spouse name: Emil   Number of children: 2   Years of education: Not on file   Highest education level: Not on file  Occupational History   Occupation: teacher    Comment: retired  Tobacco Use   Smoking status: Former    Current packs/day: 0.00    Types: Cigarettes    Start date: 06/14/1945    Quit date: 06/14/1957    Years since quitting: 66.7   Smokeless tobacco: Never  Vaping Use   Vaping status: Never Used  Substance and Sexual Activity   Alcohol  use: Not Currently   Drug use: No   Sexual activity: Not on file  Other Topics Concern  Not on file  Social History Narrative   Social History     Socioeconomic History       Marital status: Married   09/01/1954       Spouse name: Emil   Two people stay in the home on the fourth floor       Number of children: 2       Years of education:       Highest education level: Not on file    Exercise: yoga, walking     Social Needs       Financial resource strain: Not on file       Food insecurity - worry: Not on file        Food insecurity - inability: Not on file       Transportation needs - medical: Not on file       Transportation needs - non-medical: Not on file     Occupational History       Not on file     Tobacco Use       Smoking status: Never Smoker       Smokeless tobacco: Never Used     Substance and Sexual Activity       Alcohol  use: No       Drug use: No       Sexual activity: Not on file     Other Topics       Concerns:         Not on file     Social History Narrative       Not on file   Social Drivers of Health   Financial Resource Strain: Low Risk  (10/20/2017)   Overall Financial Resource Strain (CARDIA)    Difficulty of Paying Living Expenses: Not hard at all  Food Insecurity: No Food Insecurity (01/16/2023)   Hunger Vital Sign    Worried About Running Out of Food in the Last Year: Never true    Ran Out of Food in the Last Year: Never true  Transportation Needs: No Transportation Needs (01/16/2023)   PRAPARE - Administrator, Civil Service (Medical): No    Lack of Transportation (Non-Medical): No  Physical Activity: Inactive (10/20/2017)   Exercise Vital Sign    Days of Exercise per Week: 0 days    Minutes of Exercise per Session: 0 min  Stress: No Stress Concern Present (10/20/2017)   [employer] of Occupational Health - Occupational Stress Questionnaire    Feeling of Stress : Only a little  Social Connections: Somewhat Isolated (10/20/2017)   Social Connection and Isolation Panel    Frequency of Communication with Friends and Family: More than three times a week    Frequency of Social Gatherings with Friends and Family: More than three times a week    Attends Religious Services: Never    Database administrator or Organizations: No    Attends Banker Meetings: Never    Marital Status: Married  Catering manager Violence: Not At Risk (01/16/2023)   Humiliation, Afraid, Rape, and Kick questionnaire    Fear of Current or Ex-Partner: No    Emotionally  Abused: No    Physically Abused: No    Sexually Abused: No    Functional Status Survey:    Family History  Problem Relation Age of Onset   Heart failure Mother    Heart disease Father    Arthritis Sister     Health Maintenance  Topic Date  Due   COVID-19 Vaccine (6 - 2025-26 season) 02/13/2024   Medicare Annual Wellness (AWV)  04/11/2024   Influenza Vaccine  03/08/2024 (Originally 01/13/2024)   DTaP/Tdap/Td (2 - Td or Tdap) 11/26/2029   Pneumococcal Vaccine: 50+ Years  Completed   DEXA SCAN  Completed   Zoster Vaccines- Shingrix  Completed   HPV VACCINES  Aged Out   Meningococcal B Vaccine  Aged Out    Allergies  Allergen Reactions   Penicillins Swelling and Other (See Comments)    Facial swelling; can tolerate cephalosporins    Ace Inhibitors Other (See Comments)    Reaction unknown   Flagyl [Metronidazole] Other (See Comments)    Reaction unknown    Outpatient Encounter Medications as of 03/05/2024  Medication Sig   acetaminophen  (TYLENOL ) 325 MG tablet Take 2 tablets (650 mg total) by mouth every 6 (six) hours as needed for mild pain, fever or headache.   aspirin  EC 81 MG tablet Take 81 mg by mouth daily. Monday, Wednesday and Friday   atorvastatin  (LIPITOR) 20 MG tablet Take 20 mg by mouth daily.   camphor-menthol (SARNA) lotion Apply 1 Application topically 2 (two) times daily.   carvedilol  (COREG ) 6.25 MG tablet Take 6.25 mg by mouth 2 (two) times daily with a meal.   cholecalciferol (VITAMIN D3) 25 MCG (1000 UNIT) tablet Take 1,000 Units by mouth every Monday, Wednesday, and Friday.   denosumab (PROLIA) 60 MG/ML SOSY injection Inject 60 mg into the skin every 6 (six) months.   doxycycline  (VIBRAMYCIN ) 50 MG capsule Take 50 mg by mouth daily.   ferrous sulfate  (FEROSUL) 325 (65 FE) MG tablet Take 1 tablet (325 mg total) by mouth daily with breakfast. MON, WED, FRI   furosemide  (LASIX ) 40 MG tablet Take 40 mg by mouth daily.   guaiFENesin -dextromethorphan  (ROBITUSSIN DM) 100-10 MG/5ML syrup Take 10 mLs by mouth every 6 (six) hours as needed for cough.   lactose free nutrition (BOOST) LIQD Take 237 mLs by mouth daily.   losartan  (COZAAR ) 25 MG tablet Take 1 tablet (25 mg total) by mouth daily.   melatonin 3 MG TABS tablet Take 1 tablet (3 mg total) by mouth at bedtime.   Multiple Vitamins-Minerals (ICAPS AREDS 2 PO) Take 1 capsule by mouth daily with breakfast.   MYRBETRIQ  25 MG TB24 tablet TAKE 2 TABLETS BY MOUTH ONCE  DAILY   pantoprazole  (PROTONIX ) 40 MG tablet Take 1 tablet (40 mg total) by mouth daily.   potassium chloride  SA (KLOR-CON  M) 20 MEQ tablet Take 0.5 tablets (10 mEq total) by mouth daily.   senna-docusate (SENOKOT-S) 8.6-50 MG tablet Take 1 tablet by mouth at bedtime as needed for mild constipation.   spironolactone  (ALDACTONE ) 25 MG tablet Take 0.5 tablets (12.5 mg total) by mouth daily.   triamcinolone  cream (KENALOG ) 0.1 % Apply 1 Application topically 2 (two) times daily. Every 12 hours for rash   vitamin C  (ASCORBIC ACID ) 250 MG tablet Take 1 tablet (250 mg total) by mouth daily.   No facility-administered encounter medications on file as of 03/05/2024.    Review of Systems  Constitutional:  Negative for fever.  HENT:  Negative for sore throat.   Respiratory:  Negative for cough, shortness of breath and wheezing.   Cardiovascular:  Negative for chest pain, palpitations and leg swelling.  Gastrointestinal:  Negative for abdominal pain, blood in stool, constipation, diarrhea, nausea and vomiting.  Genitourinary:  Negative for dysuria and urgency.  Neurological:  Negative for numbness.  Negative unless indicated in HPI.  There were no vitals filed for this visit. There is no height or weight on file to calculate BMI. BP Readings from Last 3 Encounters:  02/29/24 132/85  02/09/24 (!) 140/82  02/01/24 124/71   Wt Readings from Last 3 Encounters:  02/09/24 122 lb (55.3 kg)  12/05/23 120 lb 9.6 oz (54.7 kg)  11/22/23  123 lb 6.4 oz (56 kg)   Physical Exam Constitutional:      Appearance: Normal appearance.  HENT:     Head: Normocephalic and atraumatic.  Cardiovascular:     Rate and Rhythm: Normal rate and regular rhythm.  Pulmonary:     Effort: Pulmonary effort is normal. No respiratory distress.     Breath sounds: Normal breath sounds. No wheezing.  Abdominal:     General: Bowel sounds are normal. There is no distension.     Tenderness: There is no abdominal tenderness. There is no guarding or rebound.     Comments:    Musculoskeletal:        General: No swelling.  Skin:    General: Skin is dry.  Neurological:     Mental Status: She is alert. Mental status is at baseline.     Motor: No weakness.     Labs reviewed: Basic Metabolic Panel: Recent Labs    04/06/23 0000  NA 139  K 4.2  CL 103  CO2 24*  BUN 25*  CREATININE 1.6*  CALCIUM  9.3   Liver Function Tests: No results for input(s): AST, ALT, ALKPHOS, BILITOT, PROT, ALBUMIN in the last 8760 hours. No results for input(s): LIPASE, AMYLASE in the last 8760 hours. No results for input(s): AMMONIA in the last 8760 hours. CBC: Recent Labs    04/06/23 0000 05/02/23 1018 01/04/24 1027 02/01/24 0958 02/29/24 0957  WBC 6.5  --   --   --   --   NEUTROABS 3,354.00  --   --   --   --   HGB 8.9*   < > 11.4* 10.7* 11.4*  HCT 28*  --   --   --   --   PLT 282  --   --   --   --    < > = values in this interval not displayed.   Cardiac Enzymes: No results for input(s): CKTOTAL, CKMB, CKMBINDEX, TROPONINI in the last 8760 hours. BNP: Invalid input(s): POCBNP No results found for: HGBA1C Lab Results  Component Value Date   TSH 0.862 01/16/2023   Lab Results  Component Value Date   VITAMINB12 2,430 (H) 01/16/2023   Lab Results  Component Value Date   FOLATE >40.0 01/16/2023   Lab Results  Component Value Date   IRON 93 02/29/2024   TIBC 265 02/29/2024   FERRITIN 117 02/29/2024    Imaging  and Procedures obtained prior to SNF admission: No results found.  Assessment and Plan Assessment & Plan  Congestive heart failure  Euvolemic on exam  Lungs clear  No lower extremity swelling Cont with lasix  , potassium supplements Avoid salty foods  CKD    Latest Ref Rng & Units 04/06/2023   12:00 AM 01/25/2023   12:00 AM 01/22/2023   12:00 AM  BMP  BUN 4 - 21 25      29       Creatinine 0.5 - 1.1 1.6     1.6     1.5      Sodium 137 - 147 139     132  129      Potassium 3.5 - 5.1 mEq/L 4.2     4.9     4.2      Chloride 99 - 108 103     99     97      CO2 13 - 22 24     26     20       Calcium  8.7 - 10.7 9.3     9.2     9.1         This result is from an external source.   Will check labs  Avoid nephrotoxic meds  GERD Cont with protonix  Denies bloody or dark stools  HTN  Cont with coreg    Urinary incontinence Cont with Myrbetriq   HLD On lipitor   Pemphigoid  Follow up with dermatology  Cont with doxycycline    Osteoporosis Cont with prolia, calcium , vit  D   CKD stage 3 Avoid nephrotoxic meds Increase oral hydration

## 2024-03-06 DIAGNOSIS — N3946 Mixed incontinence: Secondary | ICD-10-CM | POA: Diagnosis not present

## 2024-03-06 DIAGNOSIS — R41841 Cognitive communication deficit: Secondary | ICD-10-CM | POA: Diagnosis not present

## 2024-03-06 DIAGNOSIS — R278 Other lack of coordination: Secondary | ICD-10-CM | POA: Diagnosis not present

## 2024-03-07 DIAGNOSIS — R278 Other lack of coordination: Secondary | ICD-10-CM | POA: Diagnosis not present

## 2024-03-07 DIAGNOSIS — N3946 Mixed incontinence: Secondary | ICD-10-CM | POA: Diagnosis not present

## 2024-03-07 DIAGNOSIS — R41841 Cognitive communication deficit: Secondary | ICD-10-CM | POA: Diagnosis not present

## 2024-03-08 DIAGNOSIS — R41841 Cognitive communication deficit: Secondary | ICD-10-CM | POA: Diagnosis not present

## 2024-03-08 DIAGNOSIS — N3946 Mixed incontinence: Secondary | ICD-10-CM | POA: Diagnosis not present

## 2024-03-08 DIAGNOSIS — R278 Other lack of coordination: Secondary | ICD-10-CM | POA: Diagnosis not present

## 2024-03-09 DIAGNOSIS — N3946 Mixed incontinence: Secondary | ICD-10-CM | POA: Diagnosis not present

## 2024-03-09 DIAGNOSIS — R41841 Cognitive communication deficit: Secondary | ICD-10-CM | POA: Diagnosis not present

## 2024-03-09 DIAGNOSIS — R278 Other lack of coordination: Secondary | ICD-10-CM | POA: Diagnosis not present

## 2024-03-12 ENCOUNTER — Other Ambulatory Visit (HOSPITAL_COMMUNITY): Payer: Self-pay | Admitting: Pharmacy Technician

## 2024-03-12 DIAGNOSIS — N3946 Mixed incontinence: Secondary | ICD-10-CM | POA: Diagnosis not present

## 2024-03-12 DIAGNOSIS — R41841 Cognitive communication deficit: Secondary | ICD-10-CM | POA: Diagnosis not present

## 2024-03-12 DIAGNOSIS — R278 Other lack of coordination: Secondary | ICD-10-CM | POA: Diagnosis not present

## 2024-03-13 DIAGNOSIS — I872 Venous insufficiency (chronic) (peripheral): Secondary | ICD-10-CM | POA: Diagnosis not present

## 2024-03-13 DIAGNOSIS — R41841 Cognitive communication deficit: Secondary | ICD-10-CM | POA: Diagnosis not present

## 2024-03-13 DIAGNOSIS — R634 Abnormal weight loss: Secondary | ICD-10-CM | POA: Diagnosis not present

## 2024-03-13 DIAGNOSIS — N3946 Mixed incontinence: Secondary | ICD-10-CM | POA: Diagnosis not present

## 2024-03-13 DIAGNOSIS — D631 Anemia in chronic kidney disease: Secondary | ICD-10-CM | POA: Diagnosis not present

## 2024-03-13 DIAGNOSIS — I10 Essential (primary) hypertension: Secondary | ICD-10-CM | POA: Diagnosis not present

## 2024-03-13 DIAGNOSIS — E559 Vitamin D deficiency, unspecified: Secondary | ICD-10-CM | POA: Diagnosis not present

## 2024-03-13 DIAGNOSIS — R278 Other lack of coordination: Secondary | ICD-10-CM | POA: Diagnosis not present

## 2024-03-13 LAB — LIPID PANEL
Cholesterol: 106 (ref 0–200)
HDL: 35 (ref 35–70)
LDL Cholesterol: 53
LDl/HDL Ratio: 3
Triglycerides: 95 (ref 40–160)

## 2024-03-13 LAB — VITAMIN B12: Vitamin B-12: 191

## 2024-03-13 LAB — COMPREHENSIVE METABOLIC PANEL WITH GFR
Calcium: 8.4 — AB (ref 8.7–10.7)
eGFR: 31

## 2024-03-13 LAB — BASIC METABOLIC PANEL WITH GFR
BUN: 28 — AB (ref 4–21)
CO2: 27 — AB (ref 13–22)
Chloride: 105 (ref 99–108)
Creatinine: 1.5 — AB (ref 0.5–1.1)
Glucose: 80
Potassium: 4.3 meq/L (ref 3.5–5.1)
Sodium: 137 (ref 137–147)

## 2024-03-13 LAB — VITAMIN D 25 HYDROXY (VIT D DEFICIENCY, FRACTURES): Vit D, 25-Hydroxy: 54

## 2024-03-14 ENCOUNTER — Encounter: Payer: Self-pay | Admitting: Nurse Practitioner

## 2024-03-14 DIAGNOSIS — R296 Repeated falls: Secondary | ICD-10-CM | POA: Diagnosis not present

## 2024-03-14 DIAGNOSIS — E538 Deficiency of other specified B group vitamins: Secondary | ICD-10-CM | POA: Insufficient documentation

## 2024-03-14 DIAGNOSIS — M62511 Muscle wasting and atrophy, not elsewhere classified, right shoulder: Secondary | ICD-10-CM | POA: Diagnosis not present

## 2024-03-14 DIAGNOSIS — M62512 Muscle wasting and atrophy, not elsewhere classified, left shoulder: Secondary | ICD-10-CM | POA: Diagnosis not present

## 2024-03-14 DIAGNOSIS — N3946 Mixed incontinence: Secondary | ICD-10-CM | POA: Diagnosis not present

## 2024-03-14 DIAGNOSIS — I129 Hypertensive chronic kidney disease with stage 1 through stage 4 chronic kidney disease, or unspecified chronic kidney disease: Secondary | ICD-10-CM | POA: Diagnosis not present

## 2024-03-14 DIAGNOSIS — D631 Anemia in chronic kidney disease: Secondary | ICD-10-CM | POA: Diagnosis not present

## 2024-03-14 DIAGNOSIS — R41841 Cognitive communication deficit: Secondary | ICD-10-CM | POA: Diagnosis not present

## 2024-03-14 DIAGNOSIS — R2689 Other abnormalities of gait and mobility: Secondary | ICD-10-CM | POA: Diagnosis not present

## 2024-03-14 DIAGNOSIS — R278 Other lack of coordination: Secondary | ICD-10-CM | POA: Diagnosis not present

## 2024-03-14 DIAGNOSIS — M6281 Muscle weakness (generalized): Secondary | ICD-10-CM | POA: Diagnosis not present

## 2024-03-14 DIAGNOSIS — N184 Chronic kidney disease, stage 4 (severe): Secondary | ICD-10-CM | POA: Diagnosis not present

## 2024-03-14 DIAGNOSIS — R809 Proteinuria, unspecified: Secondary | ICD-10-CM | POA: Diagnosis not present

## 2024-03-14 LAB — BASIC METABOLIC PANEL WITH GFR
BUN: 26 — AB (ref 4–21)
CO2: 26 — AB (ref 13–22)
Chloride: 100 (ref 99–108)
Creatinine: 1.6 — AB (ref 0.5–1.1)
Glucose: 113
Potassium: 4.2 meq/L (ref 3.5–5.1)
Sodium: 137 (ref 137–147)

## 2024-03-14 LAB — COMPREHENSIVE METABOLIC PANEL WITH GFR
Albumin: 4.2 (ref 3.5–5.0)
Calcium: 9.4 (ref 8.7–10.7)
eGFR: 31

## 2024-03-14 LAB — CBC AND DIFFERENTIAL: Hemoglobin: 11.2 — AB (ref 12.0–16.0)

## 2024-03-15 DIAGNOSIS — N3946 Mixed incontinence: Secondary | ICD-10-CM | POA: Diagnosis not present

## 2024-03-15 DIAGNOSIS — R296 Repeated falls: Secondary | ICD-10-CM | POA: Diagnosis not present

## 2024-03-15 DIAGNOSIS — R278 Other lack of coordination: Secondary | ICD-10-CM | POA: Diagnosis not present

## 2024-03-15 DIAGNOSIS — M6281 Muscle weakness (generalized): Secondary | ICD-10-CM | POA: Diagnosis not present

## 2024-03-15 DIAGNOSIS — R2689 Other abnormalities of gait and mobility: Secondary | ICD-10-CM | POA: Diagnosis not present

## 2024-03-15 DIAGNOSIS — R41841 Cognitive communication deficit: Secondary | ICD-10-CM | POA: Diagnosis not present

## 2024-03-16 DIAGNOSIS — R2689 Other abnormalities of gait and mobility: Secondary | ICD-10-CM | POA: Diagnosis not present

## 2024-03-16 DIAGNOSIS — N3946 Mixed incontinence: Secondary | ICD-10-CM | POA: Diagnosis not present

## 2024-03-16 DIAGNOSIS — R41841 Cognitive communication deficit: Secondary | ICD-10-CM | POA: Diagnosis not present

## 2024-03-16 DIAGNOSIS — R296 Repeated falls: Secondary | ICD-10-CM | POA: Diagnosis not present

## 2024-03-16 DIAGNOSIS — M6281 Muscle weakness (generalized): Secondary | ICD-10-CM | POA: Diagnosis not present

## 2024-03-16 DIAGNOSIS — R278 Other lack of coordination: Secondary | ICD-10-CM | POA: Diagnosis not present

## 2024-03-19 DIAGNOSIS — R2689 Other abnormalities of gait and mobility: Secondary | ICD-10-CM | POA: Diagnosis not present

## 2024-03-19 DIAGNOSIS — M6281 Muscle weakness (generalized): Secondary | ICD-10-CM | POA: Diagnosis not present

## 2024-03-19 DIAGNOSIS — R278 Other lack of coordination: Secondary | ICD-10-CM | POA: Diagnosis not present

## 2024-03-19 DIAGNOSIS — R296 Repeated falls: Secondary | ICD-10-CM | POA: Diagnosis not present

## 2024-03-19 DIAGNOSIS — N3946 Mixed incontinence: Secondary | ICD-10-CM | POA: Diagnosis not present

## 2024-03-19 DIAGNOSIS — R41841 Cognitive communication deficit: Secondary | ICD-10-CM | POA: Diagnosis not present

## 2024-03-20 DIAGNOSIS — N3946 Mixed incontinence: Secondary | ICD-10-CM | POA: Diagnosis not present

## 2024-03-20 DIAGNOSIS — M6281 Muscle weakness (generalized): Secondary | ICD-10-CM | POA: Diagnosis not present

## 2024-03-20 DIAGNOSIS — R278 Other lack of coordination: Secondary | ICD-10-CM | POA: Diagnosis not present

## 2024-03-20 DIAGNOSIS — R2689 Other abnormalities of gait and mobility: Secondary | ICD-10-CM | POA: Diagnosis not present

## 2024-03-20 DIAGNOSIS — R296 Repeated falls: Secondary | ICD-10-CM | POA: Diagnosis not present

## 2024-03-20 DIAGNOSIS — R41841 Cognitive communication deficit: Secondary | ICD-10-CM | POA: Diagnosis not present

## 2024-03-21 DIAGNOSIS — R2689 Other abnormalities of gait and mobility: Secondary | ICD-10-CM | POA: Diagnosis not present

## 2024-03-21 DIAGNOSIS — R296 Repeated falls: Secondary | ICD-10-CM | POA: Diagnosis not present

## 2024-03-21 DIAGNOSIS — R41841 Cognitive communication deficit: Secondary | ICD-10-CM | POA: Diagnosis not present

## 2024-03-21 DIAGNOSIS — M6281 Muscle weakness (generalized): Secondary | ICD-10-CM | POA: Diagnosis not present

## 2024-03-21 DIAGNOSIS — R278 Other lack of coordination: Secondary | ICD-10-CM | POA: Diagnosis not present

## 2024-03-21 DIAGNOSIS — N3946 Mixed incontinence: Secondary | ICD-10-CM | POA: Diagnosis not present

## 2024-03-22 DIAGNOSIS — N3946 Mixed incontinence: Secondary | ICD-10-CM | POA: Diagnosis not present

## 2024-03-22 DIAGNOSIS — R2689 Other abnormalities of gait and mobility: Secondary | ICD-10-CM | POA: Diagnosis not present

## 2024-03-22 DIAGNOSIS — R41841 Cognitive communication deficit: Secondary | ICD-10-CM | POA: Diagnosis not present

## 2024-03-22 DIAGNOSIS — R296 Repeated falls: Secondary | ICD-10-CM | POA: Diagnosis not present

## 2024-03-22 DIAGNOSIS — M6281 Muscle weakness (generalized): Secondary | ICD-10-CM | POA: Diagnosis not present

## 2024-03-22 DIAGNOSIS — R278 Other lack of coordination: Secondary | ICD-10-CM | POA: Diagnosis not present

## 2024-03-23 DIAGNOSIS — R2689 Other abnormalities of gait and mobility: Secondary | ICD-10-CM | POA: Diagnosis not present

## 2024-03-23 DIAGNOSIS — R278 Other lack of coordination: Secondary | ICD-10-CM | POA: Diagnosis not present

## 2024-03-23 DIAGNOSIS — R296 Repeated falls: Secondary | ICD-10-CM | POA: Diagnosis not present

## 2024-03-23 DIAGNOSIS — R41841 Cognitive communication deficit: Secondary | ICD-10-CM | POA: Diagnosis not present

## 2024-03-23 DIAGNOSIS — M6281 Muscle weakness (generalized): Secondary | ICD-10-CM | POA: Diagnosis not present

## 2024-03-23 DIAGNOSIS — N3946 Mixed incontinence: Secondary | ICD-10-CM | POA: Diagnosis not present

## 2024-03-26 DIAGNOSIS — M6281 Muscle weakness (generalized): Secondary | ICD-10-CM | POA: Diagnosis not present

## 2024-03-26 DIAGNOSIS — R41841 Cognitive communication deficit: Secondary | ICD-10-CM | POA: Diagnosis not present

## 2024-03-26 DIAGNOSIS — N3946 Mixed incontinence: Secondary | ICD-10-CM | POA: Diagnosis not present

## 2024-03-26 DIAGNOSIS — R296 Repeated falls: Secondary | ICD-10-CM | POA: Diagnosis not present

## 2024-03-26 DIAGNOSIS — R2689 Other abnormalities of gait and mobility: Secondary | ICD-10-CM | POA: Diagnosis not present

## 2024-03-26 DIAGNOSIS — R278 Other lack of coordination: Secondary | ICD-10-CM | POA: Diagnosis not present

## 2024-03-27 DIAGNOSIS — R41841 Cognitive communication deficit: Secondary | ICD-10-CM | POA: Diagnosis not present

## 2024-03-27 DIAGNOSIS — N3946 Mixed incontinence: Secondary | ICD-10-CM | POA: Diagnosis not present

## 2024-03-27 DIAGNOSIS — R2689 Other abnormalities of gait and mobility: Secondary | ICD-10-CM | POA: Diagnosis not present

## 2024-03-27 DIAGNOSIS — R296 Repeated falls: Secondary | ICD-10-CM | POA: Diagnosis not present

## 2024-03-27 DIAGNOSIS — R278 Other lack of coordination: Secondary | ICD-10-CM | POA: Diagnosis not present

## 2024-03-27 DIAGNOSIS — M6281 Muscle weakness (generalized): Secondary | ICD-10-CM | POA: Diagnosis not present

## 2024-03-28 ENCOUNTER — Inpatient Hospital Stay (HOSPITAL_COMMUNITY): Admission: RE | Admit: 2024-03-28 | Source: Ambulatory Visit

## 2024-03-28 DIAGNOSIS — R2689 Other abnormalities of gait and mobility: Secondary | ICD-10-CM | POA: Diagnosis not present

## 2024-03-28 DIAGNOSIS — M6281 Muscle weakness (generalized): Secondary | ICD-10-CM | POA: Diagnosis not present

## 2024-03-28 DIAGNOSIS — N3946 Mixed incontinence: Secondary | ICD-10-CM | POA: Diagnosis not present

## 2024-03-28 DIAGNOSIS — R296 Repeated falls: Secondary | ICD-10-CM | POA: Diagnosis not present

## 2024-03-28 DIAGNOSIS — R278 Other lack of coordination: Secondary | ICD-10-CM | POA: Diagnosis not present

## 2024-03-28 DIAGNOSIS — R41841 Cognitive communication deficit: Secondary | ICD-10-CM | POA: Diagnosis not present

## 2024-03-29 DIAGNOSIS — R2689 Other abnormalities of gait and mobility: Secondary | ICD-10-CM | POA: Diagnosis not present

## 2024-03-29 DIAGNOSIS — N3946 Mixed incontinence: Secondary | ICD-10-CM | POA: Diagnosis not present

## 2024-03-29 DIAGNOSIS — R41841 Cognitive communication deficit: Secondary | ICD-10-CM | POA: Diagnosis not present

## 2024-03-29 DIAGNOSIS — R296 Repeated falls: Secondary | ICD-10-CM | POA: Diagnosis not present

## 2024-03-29 DIAGNOSIS — R278 Other lack of coordination: Secondary | ICD-10-CM | POA: Diagnosis not present

## 2024-03-29 DIAGNOSIS — M6281 Muscle weakness (generalized): Secondary | ICD-10-CM | POA: Diagnosis not present

## 2024-03-30 DIAGNOSIS — M6281 Muscle weakness (generalized): Secondary | ICD-10-CM | POA: Diagnosis not present

## 2024-03-30 DIAGNOSIS — R296 Repeated falls: Secondary | ICD-10-CM | POA: Diagnosis not present

## 2024-03-30 DIAGNOSIS — N3946 Mixed incontinence: Secondary | ICD-10-CM | POA: Diagnosis not present

## 2024-03-30 DIAGNOSIS — R41841 Cognitive communication deficit: Secondary | ICD-10-CM | POA: Diagnosis not present

## 2024-03-30 DIAGNOSIS — R278 Other lack of coordination: Secondary | ICD-10-CM | POA: Diagnosis not present

## 2024-03-30 DIAGNOSIS — R2689 Other abnormalities of gait and mobility: Secondary | ICD-10-CM | POA: Diagnosis not present

## 2024-04-02 DIAGNOSIS — R278 Other lack of coordination: Secondary | ICD-10-CM | POA: Diagnosis not present

## 2024-04-02 DIAGNOSIS — N3946 Mixed incontinence: Secondary | ICD-10-CM | POA: Diagnosis not present

## 2024-04-02 DIAGNOSIS — M6281 Muscle weakness (generalized): Secondary | ICD-10-CM | POA: Diagnosis not present

## 2024-04-02 DIAGNOSIS — R296 Repeated falls: Secondary | ICD-10-CM | POA: Diagnosis not present

## 2024-04-02 DIAGNOSIS — R41841 Cognitive communication deficit: Secondary | ICD-10-CM | POA: Diagnosis not present

## 2024-04-02 DIAGNOSIS — R2689 Other abnormalities of gait and mobility: Secondary | ICD-10-CM | POA: Diagnosis not present

## 2024-04-03 DIAGNOSIS — R278 Other lack of coordination: Secondary | ICD-10-CM | POA: Diagnosis not present

## 2024-04-03 DIAGNOSIS — R41841 Cognitive communication deficit: Secondary | ICD-10-CM | POA: Diagnosis not present

## 2024-04-03 DIAGNOSIS — R2689 Other abnormalities of gait and mobility: Secondary | ICD-10-CM | POA: Diagnosis not present

## 2024-04-03 DIAGNOSIS — R296 Repeated falls: Secondary | ICD-10-CM | POA: Diagnosis not present

## 2024-04-03 DIAGNOSIS — M6281 Muscle weakness (generalized): Secondary | ICD-10-CM | POA: Diagnosis not present

## 2024-04-03 DIAGNOSIS — N3946 Mixed incontinence: Secondary | ICD-10-CM | POA: Diagnosis not present

## 2024-04-04 DIAGNOSIS — R278 Other lack of coordination: Secondary | ICD-10-CM | POA: Diagnosis not present

## 2024-04-04 DIAGNOSIS — R2689 Other abnormalities of gait and mobility: Secondary | ICD-10-CM | POA: Diagnosis not present

## 2024-04-04 DIAGNOSIS — R41841 Cognitive communication deficit: Secondary | ICD-10-CM | POA: Diagnosis not present

## 2024-04-04 DIAGNOSIS — N3946 Mixed incontinence: Secondary | ICD-10-CM | POA: Diagnosis not present

## 2024-04-04 DIAGNOSIS — R296 Repeated falls: Secondary | ICD-10-CM | POA: Diagnosis not present

## 2024-04-04 DIAGNOSIS — M6281 Muscle weakness (generalized): Secondary | ICD-10-CM | POA: Diagnosis not present

## 2024-04-05 DIAGNOSIS — M6281 Muscle weakness (generalized): Secondary | ICD-10-CM | POA: Diagnosis not present

## 2024-04-05 DIAGNOSIS — R296 Repeated falls: Secondary | ICD-10-CM | POA: Diagnosis not present

## 2024-04-05 DIAGNOSIS — R2689 Other abnormalities of gait and mobility: Secondary | ICD-10-CM | POA: Diagnosis not present

## 2024-04-05 DIAGNOSIS — R278 Other lack of coordination: Secondary | ICD-10-CM | POA: Diagnosis not present

## 2024-04-05 DIAGNOSIS — N3946 Mixed incontinence: Secondary | ICD-10-CM | POA: Diagnosis not present

## 2024-04-05 DIAGNOSIS — R41841 Cognitive communication deficit: Secondary | ICD-10-CM | POA: Diagnosis not present

## 2024-04-06 DIAGNOSIS — R2689 Other abnormalities of gait and mobility: Secondary | ICD-10-CM | POA: Diagnosis not present

## 2024-04-06 DIAGNOSIS — R41841 Cognitive communication deficit: Secondary | ICD-10-CM | POA: Diagnosis not present

## 2024-04-06 DIAGNOSIS — R296 Repeated falls: Secondary | ICD-10-CM | POA: Diagnosis not present

## 2024-04-06 DIAGNOSIS — M6281 Muscle weakness (generalized): Secondary | ICD-10-CM | POA: Diagnosis not present

## 2024-04-06 DIAGNOSIS — R278 Other lack of coordination: Secondary | ICD-10-CM | POA: Diagnosis not present

## 2024-04-06 DIAGNOSIS — N3946 Mixed incontinence: Secondary | ICD-10-CM | POA: Diagnosis not present

## 2024-04-09 DIAGNOSIS — Z23 Encounter for immunization: Secondary | ICD-10-CM | POA: Diagnosis not present

## 2024-04-09 DIAGNOSIS — M6281 Muscle weakness (generalized): Secondary | ICD-10-CM | POA: Diagnosis not present

## 2024-04-09 DIAGNOSIS — R41841 Cognitive communication deficit: Secondary | ICD-10-CM | POA: Diagnosis not present

## 2024-04-09 DIAGNOSIS — R2689 Other abnormalities of gait and mobility: Secondary | ICD-10-CM | POA: Diagnosis not present

## 2024-04-09 DIAGNOSIS — N3946 Mixed incontinence: Secondary | ICD-10-CM | POA: Diagnosis not present

## 2024-04-09 DIAGNOSIS — R296 Repeated falls: Secondary | ICD-10-CM | POA: Diagnosis not present

## 2024-04-09 DIAGNOSIS — R278 Other lack of coordination: Secondary | ICD-10-CM | POA: Diagnosis not present

## 2024-04-10 DIAGNOSIS — R2689 Other abnormalities of gait and mobility: Secondary | ICD-10-CM | POA: Diagnosis not present

## 2024-04-10 DIAGNOSIS — R278 Other lack of coordination: Secondary | ICD-10-CM | POA: Diagnosis not present

## 2024-04-10 DIAGNOSIS — R296 Repeated falls: Secondary | ICD-10-CM | POA: Diagnosis not present

## 2024-04-10 DIAGNOSIS — R41841 Cognitive communication deficit: Secondary | ICD-10-CM | POA: Diagnosis not present

## 2024-04-10 DIAGNOSIS — M6281 Muscle weakness (generalized): Secondary | ICD-10-CM | POA: Diagnosis not present

## 2024-04-10 DIAGNOSIS — N3946 Mixed incontinence: Secondary | ICD-10-CM | POA: Diagnosis not present

## 2024-04-11 DIAGNOSIS — N3946 Mixed incontinence: Secondary | ICD-10-CM | POA: Diagnosis not present

## 2024-04-11 DIAGNOSIS — M6281 Muscle weakness (generalized): Secondary | ICD-10-CM | POA: Diagnosis not present

## 2024-04-11 DIAGNOSIS — R278 Other lack of coordination: Secondary | ICD-10-CM | POA: Diagnosis not present

## 2024-04-11 DIAGNOSIS — R296 Repeated falls: Secondary | ICD-10-CM | POA: Diagnosis not present

## 2024-04-11 DIAGNOSIS — R41841 Cognitive communication deficit: Secondary | ICD-10-CM | POA: Diagnosis not present

## 2024-04-11 DIAGNOSIS — R2689 Other abnormalities of gait and mobility: Secondary | ICD-10-CM | POA: Diagnosis not present

## 2024-04-12 DIAGNOSIS — R296 Repeated falls: Secondary | ICD-10-CM | POA: Diagnosis not present

## 2024-04-12 DIAGNOSIS — N3946 Mixed incontinence: Secondary | ICD-10-CM | POA: Diagnosis not present

## 2024-04-12 DIAGNOSIS — R278 Other lack of coordination: Secondary | ICD-10-CM | POA: Diagnosis not present

## 2024-04-12 DIAGNOSIS — M6281 Muscle weakness (generalized): Secondary | ICD-10-CM | POA: Diagnosis not present

## 2024-04-12 DIAGNOSIS — R2689 Other abnormalities of gait and mobility: Secondary | ICD-10-CM | POA: Diagnosis not present

## 2024-04-12 DIAGNOSIS — R41841 Cognitive communication deficit: Secondary | ICD-10-CM | POA: Diagnosis not present

## 2024-04-13 DIAGNOSIS — R2689 Other abnormalities of gait and mobility: Secondary | ICD-10-CM | POA: Diagnosis not present

## 2024-04-13 DIAGNOSIS — R296 Repeated falls: Secondary | ICD-10-CM | POA: Diagnosis not present

## 2024-04-13 DIAGNOSIS — R278 Other lack of coordination: Secondary | ICD-10-CM | POA: Diagnosis not present

## 2024-04-13 DIAGNOSIS — N3946 Mixed incontinence: Secondary | ICD-10-CM | POA: Diagnosis not present

## 2024-04-13 DIAGNOSIS — R41841 Cognitive communication deficit: Secondary | ICD-10-CM | POA: Diagnosis not present

## 2024-04-13 DIAGNOSIS — M6281 Muscle weakness (generalized): Secondary | ICD-10-CM | POA: Diagnosis not present

## 2024-04-16 DIAGNOSIS — M6281 Muscle weakness (generalized): Secondary | ICD-10-CM | POA: Diagnosis not present

## 2024-04-16 DIAGNOSIS — M62511 Muscle wasting and atrophy, not elsewhere classified, right shoulder: Secondary | ICD-10-CM | POA: Diagnosis not present

## 2024-04-16 DIAGNOSIS — R278 Other lack of coordination: Secondary | ICD-10-CM | POA: Diagnosis not present

## 2024-04-16 DIAGNOSIS — R2689 Other abnormalities of gait and mobility: Secondary | ICD-10-CM | POA: Diagnosis not present

## 2024-04-16 DIAGNOSIS — M62512 Muscle wasting and atrophy, not elsewhere classified, left shoulder: Secondary | ICD-10-CM | POA: Diagnosis not present

## 2024-04-16 DIAGNOSIS — N3946 Mixed incontinence: Secondary | ICD-10-CM | POA: Diagnosis not present

## 2024-04-16 DIAGNOSIS — R41841 Cognitive communication deficit: Secondary | ICD-10-CM | POA: Diagnosis not present

## 2024-04-16 DIAGNOSIS — R296 Repeated falls: Secondary | ICD-10-CM | POA: Diagnosis not present

## 2024-04-17 DIAGNOSIS — R41841 Cognitive communication deficit: Secondary | ICD-10-CM | POA: Diagnosis not present

## 2024-04-17 DIAGNOSIS — N3946 Mixed incontinence: Secondary | ICD-10-CM | POA: Diagnosis not present

## 2024-04-17 DIAGNOSIS — R296 Repeated falls: Secondary | ICD-10-CM | POA: Diagnosis not present

## 2024-04-17 DIAGNOSIS — M6281 Muscle weakness (generalized): Secondary | ICD-10-CM | POA: Diagnosis not present

## 2024-04-17 DIAGNOSIS — R278 Other lack of coordination: Secondary | ICD-10-CM | POA: Diagnosis not present

## 2024-04-17 DIAGNOSIS — R2689 Other abnormalities of gait and mobility: Secondary | ICD-10-CM | POA: Diagnosis not present

## 2024-04-18 DIAGNOSIS — R41841 Cognitive communication deficit: Secondary | ICD-10-CM | POA: Diagnosis not present

## 2024-04-18 DIAGNOSIS — N3946 Mixed incontinence: Secondary | ICD-10-CM | POA: Diagnosis not present

## 2024-04-18 DIAGNOSIS — R296 Repeated falls: Secondary | ICD-10-CM | POA: Diagnosis not present

## 2024-04-18 DIAGNOSIS — R278 Other lack of coordination: Secondary | ICD-10-CM | POA: Diagnosis not present

## 2024-04-18 DIAGNOSIS — M6281 Muscle weakness (generalized): Secondary | ICD-10-CM | POA: Diagnosis not present

## 2024-04-18 DIAGNOSIS — R2689 Other abnormalities of gait and mobility: Secondary | ICD-10-CM | POA: Diagnosis not present

## 2024-04-19 DIAGNOSIS — M6281 Muscle weakness (generalized): Secondary | ICD-10-CM | POA: Diagnosis not present

## 2024-04-19 DIAGNOSIS — R41841 Cognitive communication deficit: Secondary | ICD-10-CM | POA: Diagnosis not present

## 2024-04-19 DIAGNOSIS — R2689 Other abnormalities of gait and mobility: Secondary | ICD-10-CM | POA: Diagnosis not present

## 2024-04-19 DIAGNOSIS — R296 Repeated falls: Secondary | ICD-10-CM | POA: Diagnosis not present

## 2024-04-19 DIAGNOSIS — R278 Other lack of coordination: Secondary | ICD-10-CM | POA: Diagnosis not present

## 2024-04-19 DIAGNOSIS — N3946 Mixed incontinence: Secondary | ICD-10-CM | POA: Diagnosis not present

## 2024-04-20 DIAGNOSIS — R2689 Other abnormalities of gait and mobility: Secondary | ICD-10-CM | POA: Diagnosis not present

## 2024-04-20 DIAGNOSIS — R278 Other lack of coordination: Secondary | ICD-10-CM | POA: Diagnosis not present

## 2024-04-20 DIAGNOSIS — M6281 Muscle weakness (generalized): Secondary | ICD-10-CM | POA: Diagnosis not present

## 2024-04-20 DIAGNOSIS — N3946 Mixed incontinence: Secondary | ICD-10-CM | POA: Diagnosis not present

## 2024-04-20 DIAGNOSIS — R296 Repeated falls: Secondary | ICD-10-CM | POA: Diagnosis not present

## 2024-04-20 DIAGNOSIS — R41841 Cognitive communication deficit: Secondary | ICD-10-CM | POA: Diagnosis not present

## 2024-04-23 DIAGNOSIS — R278 Other lack of coordination: Secondary | ICD-10-CM | POA: Diagnosis not present

## 2024-04-23 DIAGNOSIS — N3946 Mixed incontinence: Secondary | ICD-10-CM | POA: Diagnosis not present

## 2024-04-23 DIAGNOSIS — R41841 Cognitive communication deficit: Secondary | ICD-10-CM | POA: Diagnosis not present

## 2024-04-23 DIAGNOSIS — R296 Repeated falls: Secondary | ICD-10-CM | POA: Diagnosis not present

## 2024-04-23 DIAGNOSIS — M6281 Muscle weakness (generalized): Secondary | ICD-10-CM | POA: Diagnosis not present

## 2024-04-23 DIAGNOSIS — R2689 Other abnormalities of gait and mobility: Secondary | ICD-10-CM | POA: Diagnosis not present

## 2024-04-24 DIAGNOSIS — R41841 Cognitive communication deficit: Secondary | ICD-10-CM | POA: Diagnosis not present

## 2024-04-24 DIAGNOSIS — N3946 Mixed incontinence: Secondary | ICD-10-CM | POA: Diagnosis not present

## 2024-04-24 DIAGNOSIS — R278 Other lack of coordination: Secondary | ICD-10-CM | POA: Diagnosis not present

## 2024-04-24 DIAGNOSIS — R2689 Other abnormalities of gait and mobility: Secondary | ICD-10-CM | POA: Diagnosis not present

## 2024-04-24 DIAGNOSIS — M6281 Muscle weakness (generalized): Secondary | ICD-10-CM | POA: Diagnosis not present

## 2024-04-24 DIAGNOSIS — R296 Repeated falls: Secondary | ICD-10-CM | POA: Diagnosis not present

## 2024-04-25 DIAGNOSIS — N3946 Mixed incontinence: Secondary | ICD-10-CM | POA: Diagnosis not present

## 2024-04-25 DIAGNOSIS — R2689 Other abnormalities of gait and mobility: Secondary | ICD-10-CM | POA: Diagnosis not present

## 2024-04-25 DIAGNOSIS — R41841 Cognitive communication deficit: Secondary | ICD-10-CM | POA: Diagnosis not present

## 2024-04-25 DIAGNOSIS — R296 Repeated falls: Secondary | ICD-10-CM | POA: Diagnosis not present

## 2024-04-25 DIAGNOSIS — M6281 Muscle weakness (generalized): Secondary | ICD-10-CM | POA: Diagnosis not present

## 2024-04-25 DIAGNOSIS — R278 Other lack of coordination: Secondary | ICD-10-CM | POA: Diagnosis not present

## 2024-04-26 DIAGNOSIS — R41841 Cognitive communication deficit: Secondary | ICD-10-CM | POA: Diagnosis not present

## 2024-04-26 DIAGNOSIS — R2689 Other abnormalities of gait and mobility: Secondary | ICD-10-CM | POA: Diagnosis not present

## 2024-04-26 DIAGNOSIS — R278 Other lack of coordination: Secondary | ICD-10-CM | POA: Diagnosis not present

## 2024-04-26 DIAGNOSIS — N3946 Mixed incontinence: Secondary | ICD-10-CM | POA: Diagnosis not present

## 2024-04-26 DIAGNOSIS — M6281 Muscle weakness (generalized): Secondary | ICD-10-CM | POA: Diagnosis not present

## 2024-04-26 DIAGNOSIS — R296 Repeated falls: Secondary | ICD-10-CM | POA: Diagnosis not present

## 2024-04-30 DIAGNOSIS — R2689 Other abnormalities of gait and mobility: Secondary | ICD-10-CM | POA: Diagnosis not present

## 2024-04-30 DIAGNOSIS — R278 Other lack of coordination: Secondary | ICD-10-CM | POA: Diagnosis not present

## 2024-04-30 DIAGNOSIS — N3946 Mixed incontinence: Secondary | ICD-10-CM | POA: Diagnosis not present

## 2024-04-30 DIAGNOSIS — R41841 Cognitive communication deficit: Secondary | ICD-10-CM | POA: Diagnosis not present

## 2024-04-30 DIAGNOSIS — R296 Repeated falls: Secondary | ICD-10-CM | POA: Diagnosis not present

## 2024-04-30 DIAGNOSIS — M6281 Muscle weakness (generalized): Secondary | ICD-10-CM | POA: Diagnosis not present

## 2024-05-01 ENCOUNTER — Non-Acute Institutional Stay: Payer: Self-pay | Admitting: Nurse Practitioner

## 2024-05-01 ENCOUNTER — Encounter: Payer: Self-pay | Admitting: Nurse Practitioner

## 2024-05-01 DIAGNOSIS — N184 Chronic kidney disease, stage 4 (severe): Secondary | ICD-10-CM | POA: Diagnosis not present

## 2024-05-01 DIAGNOSIS — E785 Hyperlipidemia, unspecified: Secondary | ICD-10-CM

## 2024-05-01 DIAGNOSIS — R296 Repeated falls: Secondary | ICD-10-CM | POA: Diagnosis not present

## 2024-05-01 DIAGNOSIS — N3946 Mixed incontinence: Secondary | ICD-10-CM | POA: Diagnosis not present

## 2024-05-01 DIAGNOSIS — I509 Heart failure, unspecified: Secondary | ICD-10-CM | POA: Diagnosis not present

## 2024-05-01 DIAGNOSIS — M810 Age-related osteoporosis without current pathological fracture: Secondary | ICD-10-CM

## 2024-05-01 DIAGNOSIS — I10 Essential (primary) hypertension: Secondary | ICD-10-CM

## 2024-05-01 DIAGNOSIS — R278 Other lack of coordination: Secondary | ICD-10-CM | POA: Diagnosis not present

## 2024-05-01 DIAGNOSIS — K644 Residual hemorrhoidal skin tags: Secondary | ICD-10-CM | POA: Diagnosis not present

## 2024-05-01 DIAGNOSIS — D649 Anemia, unspecified: Secondary | ICD-10-CM

## 2024-05-01 DIAGNOSIS — L129 Pemphigoid, unspecified: Secondary | ICD-10-CM | POA: Diagnosis not present

## 2024-05-01 DIAGNOSIS — R2689 Other abnormalities of gait and mobility: Secondary | ICD-10-CM | POA: Diagnosis not present

## 2024-05-01 DIAGNOSIS — N3942 Incontinence without sensory awareness: Secondary | ICD-10-CM

## 2024-05-01 DIAGNOSIS — I498 Other specified cardiac arrhythmias: Secondary | ICD-10-CM

## 2024-05-01 DIAGNOSIS — M6281 Muscle weakness (generalized): Secondary | ICD-10-CM | POA: Diagnosis not present

## 2024-05-01 DIAGNOSIS — R41841 Cognitive communication deficit: Secondary | ICD-10-CM | POA: Diagnosis not present

## 2024-05-01 DIAGNOSIS — K219 Gastro-esophageal reflux disease without esophagitis: Secondary | ICD-10-CM

## 2024-05-01 NOTE — Progress Notes (Signed)
 Location:  [facility] Room Number: [id number] Place of Service:  ALF [phone]) Provider:  Tiras Bianchini X, NP   Patient Care Team: Sherlynn Madden, MD as PCP - General (Internal Medicine) Lonni Slain, MD as PCP - Cardiology (Cardiology)  Extended Emergency Contact Information Primary Emergency Contact: Wegener,[name] Address: [address]          [place], [place] [address] [place] Home Phone: [phone] Work Phone: [phone] Mobile Phone: [phone] Relation: Daughter Secondary Emergency Contact: [name],[name] Home Phone: [phone] Relation: Daughter  Code Status:  DNR Goals of care: Advanced Directive information    12/05/2023   11:39 AM  Advanced Directives  Does Patient Have a Medical Advance Directive? Yes  Type of Estate Agent of [place];Out of facility DNR (pink MOST or yellow form)  Does patient want to make changes to medical advance directive? No - Patient declined  Copy of Healthcare Power of Attorney in Chart? Yes - validated most recent copy scanned in chart (See row information)  Pre-existing out of facility DNR order (yellow form or pink MOST form) Pink MOST form placed in chart (order not valid for inpatient use)     Chief Complaint  Patient presents with   Hemorrhoids    HPI:  Pt is a 89 y.o. female seen today for an acute visit for reported 04/30/2024 a small external rectal hemorrhoid with bleeding for the last few days, noted a quarter sized blood in pull-up.  Denied rectal pain, ABD pain, constipation, nausea, vomiting.  Afebrile and the patient is in her usual state of health. New onset Afib,01/2023,  cardiology: no evidence of Afib, probably wandering atrial pacer,  CHF- BNP 265, EF 40-45%, started on Carvedilol , Losartan , Aldactone , ASA, resumed Furosemide  and off  Metolazone , Amlodipine .              Emotional outburst, better             Hypokalemia, K 4.2  03/14/24 Pemphigoid, legs, responded steroid, on Doxy per dermatology Anemia, EPO 9.9 09/15/21, On Fe and Vit B12, on Pantoprazole  for GI protection, FOBT negative. Iron 93 02/29/24, Vit B12 191 03/13/24, Hgb 11.2 03/14/24 GERD, stable, on Pantoprazole .  CHF/Edema BLE, chronic, on Aldactone  and  Furosemide , followed by Cardiology. 01/2023 BNP 265, EF 40-45%, started on Carvedilol , Losartan , Aldactone , resumed Furosemide  and off  Metolazone , Amlodipine .  CKD Bun/creat 26/1.6 03/14/24,  saw nephrology, [facility] OA, Hx of R ankle fx, R hip total hip arthroplasty 11/30/21, ambulates with walker, risk of falling             Her urinary frequency/leakage, uses pads, it seems better since taking Myrbetriq  in pm per Urology             HTN, blood pressure is controlled, off Amlodipine , on Losartan , Carvedilol , Furosemide , Spironolactone .             Osteoporosis, off Alendronate  due to CKD, now on Prolia             Fecal incontinence prn Imodium              Constipation, taking Senna             HLD, taking Atorvastatin . LDL 53 03/13/24         Past Medical History:  Diagnosis Date   Anemia    Chronic kidney disease    stage 3   GERD (gastroesophageal reflux disease)    Hypertension    Past Surgical History:  Procedure Laterality Date   ORIF ANKLE FRACTURE Right 01/13/2021   Procedure: OPEN TREATMENT OF RIGHT TRIMALLEOLAR ANKLE FRACTURE WITHOUT POSTERIOR FIXATION, POSSIBLE SYNDESMOSIS;  Surgeon: Elsa Lonni SAUNDERS, MD;  Location: [facility] OR;  Service: Orthopedics;  Laterality: Right;   TOTAL HIP ARTHROPLASTY Right 12/02/2021   Procedure: TOTAL HIP ARTHROPLASTY;  Surgeon: Edna Toribio LABOR, MD;  Location: [facility] ORS;  Service: Orthopedics;  Laterality: Right;    Allergies  Allergen Reactions   Penicillins Swelling and Other (See Comments)    Facial swelling; can tolerate cephalosporins    Ace Inhibitors Other (See Comments)    Reaction unknown   Flagyl [Metronidazole] Other (See Comments)     Reaction unknown    Outpatient Encounter Medications as of 05/01/2024  Medication Sig   acetaminophen  (TYLENOL ) 325 MG tablet Take 2 tablets (650 mg total) by mouth every 6 (six) hours as needed for mild pain, fever or headache.   aspirin  EC 81 MG tablet Take 81 mg by mouth daily. Monday, Wednesday and Friday   atorvastatin  (LIPITOR) 20 MG tablet Take 20 mg by mouth daily.   camphor-menthol (SARNA) lotion Apply 1 Application topically 2 (two) times daily.   carvedilol  (COREG ) 6.25 MG tablet Take 6.25 mg by mouth 2 (two) times daily with a meal.   cholecalciferol (VITAMIN D3) 25 MCG (1000 UNIT) tablet Take 2,000 Units by mouth daily.   cyanocobalamin (VITAMIN B12) 1000 MCG tablet Take 1,000 mcg by mouth daily.   denosumab (PROLIA) 60 MG/ML SOSY injection Inject 60 mg into the skin every 6 (six) months.   doxycycline  (VIBRAMYCIN ) 50 MG capsule Take 50 mg by mouth daily.   ferrous sulfate  (FEROSUL) 325 (65 FE) MG tablet Take 1 tablet (325 mg total) by mouth daily with breakfast. MON, WED, FRI   furosemide  (LASIX ) 40 MG tablet Take 40 mg by mouth daily.   guaiFENesin -dextromethorphan (ROBITUSSIN DM) 100-10 MG/5ML syrup Take 10 mLs by mouth every 6 (six) hours as needed for cough.   lactose free nutrition (BOOST) LIQD Take 237 mLs by mouth daily.   losartan  (COZAAR ) 25 MG tablet Take 1 tablet (25 mg total) by mouth daily.   melatonin 3 MG TABS tablet Take 1 tablet (3 mg total) by mouth at bedtime.   Multiple Vitamins-Minerals (ICAPS AREDS 2 PO) Take 1 capsule by mouth daily with breakfast.   MYRBETRIQ  25 MG TB24 tablet TAKE 2 TABLETS BY MOUTH ONCE  DAILY   pantoprazole  (PROTONIX ) 40 MG tablet Take 1 tablet (40 mg total) by mouth daily.   potassium chloride  SA (KLOR-CON  M) 20 MEQ tablet Take 0.5 tablets (10 mEq total) by mouth daily.   senna-docusate (SENOKOT-S) 8.6-50 MG tablet Take 1 tablet by mouth at bedtime as needed for mild constipation.   spironolactone  (ALDACTONE ) 25 MG tablet Take 0.5  tablets (12.5 mg total) by mouth daily.   triamcinolone  cream (KENALOG ) 0.1 % Apply 1 Application topically 2 (two) times daily. Every 12 hours for rash   vitamin C  (ASCORBIC ACID ) 250 MG tablet Take 1 tablet (250 mg total) by mouth daily.   No facility-administered encounter medications on file as of 05/01/2024.    Review of Systems  Constitutional:  Negative for appetite change, fatigue and fever.  HENT:  Positive for hearing loss. Negative for congestion and sore throat.   Eyes:  Negative for visual disturbance.  Respiratory:  Negative for cough and shortness of breath.        Occasional cough, scant greenish phlegm production.   Cardiovascular:  Positive for leg swelling.  Gastrointestinal:  Positive for blood in stool. Negative for abdominal pain, constipation, nausea, rectal pain and vomiting.       Fecal incontinent.  Right groin lump  Genitourinary:  Positive for frequency. Negative for dysuria and urgency.       Occasionally incontinent of urine. Urination average every 4 hrs during day 1x/night.   Musculoskeletal:  Positive for arthralgias and gait problem.       Right lower leg/ankle brace  Skin:  Negative for color change.  Neurological:  Negative for speech difficulty, weakness and light-headedness.       Memory lapses.   Psychiatric/Behavioral:  Negative for behavioral problems and sleep disturbance. The patient is not nervous/anxious.     Immunization History  Administered Date(s) Administered    sv, Bivalent, Protein Subunit Rsvpref,pf (Abrysvo) 06/11/2022   Fluad Quad(high Dose 65+) 03/28/2019   H1N1 03/18/2009   INFLUENZA, HIGH DOSE SEASONAL PF 03/25/2017, 03/16/2018, 03/26/2020, 03/14/2021, 03/30/2021, 03/14/2022, 03/30/2022   Influenza,inj,quad, With Preservative 03/26/2020, 03/30/2021   Influenza-Unspecified 02/25/2011, 03/31/2012, 03/27/2013, 03/14/2014, 03/25/2016, 06/14/2017, 03/28/2019, 03/16/2023, 03/20/2024   Moderna Covid-19 Vaccine Bivalent Booster  29yrs & up 03/30/2023   Moderna SARS-COV2 Booster Vaccination 04/22/2020, 10/28/2020, 03/30/2023   Moderna Sars-Covid-2 Vaccination 06/18/2019, 07/16/2019   PFIZER(Purple Top)SARS-COV-2 Vaccination 03/03/2021   PPD Test 12/25/2020, 01/05/2021, 12/17/2021   Pfizer Covid-19 Vaccine Bivalent Booster 21yrs & up 03/03/2021   Pneumococcal Conjugate-13 09/10/2014   Pneumococcal Polysaccharide-23 11/02/2004, 11/03/2014   Pneumococcal-Unspecified 06/14/2017   Td (Adult),unspecified 12/02/2005   Tdap 11/27/2019   Unspecified SARS-COV-2 Vaccination 04/22/2020, 10/28/2020, 04/09/2024   Zoster Recombinant(Shingrix) 08/06/2020, 06/28/2022, 08/27/2022, 08/27/2022   Pertinent  Health Maintenance Due  Topic Date Due   Influenza Vaccine  Completed   DEXA SCAN  Completed      06/17/2022    1:45 PM 07/01/2022   11:07 AM 09/08/2022    2:58 PM 09/23/2022    3:00 PM 11/05/2022    1:51 PM  Fall Risk  Falls in the past year? 0 0 0 0 0  Was there an injury with Fall? 0 0 0 0 0  Fall Risk Category Calculator 0 0 0 0 0  Fall Risk Category (Retired) Low       (RETIRED) Patient Fall Risk Level Low fall risk       Patient at Risk for Falls Due to No Fall Risks No Fall Risks No Fall Risks No Fall Risks No Fall Risks  Fall risk Follow up Falls evaluation completed  Falls evaluation completed  Falls evaluation completed Falls evaluation completed Falls evaluation completed     Data saved with a previous flowsheet row definition   Functional Status Survey:    Vitals:   05/01/24 0857  BP: (!) 95/56  Pulse: 78  Resp: 18  Temp: (!) 97.4 F (36.3 C)  SpO2: 93%  Weight: 120 lb 9.6 oz (54.7 kg)  Height: 5' 5 (1.651 m)   Body mass index is 20.07 kg/m. Physical Exam Vitals reviewed.  Constitutional:      Appearance: Normal appearance.  HENT:     Head: Normocephalic and atraumatic.     Nose: Nose normal.     Mouth/Throat:     Mouth: Mucous membranes are moist.  Eyes:     Extraocular Movements:  Extraocular movements intact.     Conjunctiva/sclera: Conjunctivae normal.     Pupils: Pupils are equal, round, and reactive to light.  Cardiovascular:     Rate and Rhythm: Normal rate and regular  rhythm.     Heart sounds: No murmur heard. Pulmonary:     Effort: Pulmonary effort is normal.     Breath sounds: Rales present.     Comments: Bibasilar rales.  Abdominal:     General: Bowel sounds are normal.     Palpations: Abdomen is soft.     Tenderness: There is no abdominal tenderness.     Hernia: A hernia is present.     Comments: Reducible right inguinal hernia  Genitourinary:    Comments: Eternal hemorrhoids x2, no injury or active bleeding.  Musculoskeletal:        General: No tenderness.     Cervical back: Normal range of motion and neck supple.     Right lower leg: Edema present.     Left lower leg: Edema present.     Comments: R ankle s/p ORIF 01/13/21. THR 11/2021. Edema trace BLE  Skin:    General: Skin is warm and dry.     Comments: Chronic pigmented venous insufficiency skin changes BLE.   Neurological:     Mental Status: She is alert. Mental status is at baseline.     Gait: Gait abnormal.     Comments: Oriented to person, place.   Psychiatric:        Mood and Affect: Mood normal.        Behavior: Behavior normal.        Thought Content: Thought content normal.     Labs reviewed: Recent Labs    03/13/24 0000 03/14/24 0000  NA 137 137  K 4.3 4.2  CL 105 100  CO2 27* 26*  BUN 28* 26*  CREATININE 1.5* 1.6*  CALCIUM  8.4* 9.4   Recent Labs    03/14/24 0000  ALBUMIN 4.2   Recent Labs    02/01/24 0958 02/29/24 0957 03/14/24 0000  HGB 10.7* 11.4* 11.2*   Lab Results  Component Value Date   TSH 0.862 01/16/2023   No results found for: HGBA1C Lab Results  Component Value Date   CHOL 106 03/13/2024   HDL 35 03/13/2024   LDLCALC 53 03/13/2024   TRIG 95 03/13/2024   CHOLHDL 4.4 01/16/2023    Significant Diagnostic Results in last 30 days:  No  results found.  Assessment/Plan External hemorrhoids  reported 04/30/2024 a small external rectal hemorrhoid with bleeding for the last few days, noted a quarter sized blood in pull-up.  Denied rectal pain, ABD pain, constipation, nausea, vomiting.  Afebrile and the patient is in her usual state of health. 2.5% Hydrocortisone cream bid to externa hemorrhoids x 5 days, may repeat as needed  Atrial arrhythmia Onset 02/01/2023 Cardiology workup shows no evidence of A-fib  Congestive heart failure (CHF) (HCC) BNP 265, EF 40 to 45%, cardiology: Carvedilol , losartan , Aldactone , baby aspirin , furosemide  Euvolemic with mild edema BLE  Pemphigoid (HCC)  legs, responded steroid, on Doxy per dermatology  Anemia EPO 9.9 09/15/21, On Fe and Vit B12, on Pantoprazole  for GI protection, FOBT negative. Iron 93 02/29/24, Vit B12 191 03/13/24, Hgb 11.2 03/14/24  GERD (gastroesophageal reflux disease) stable, on Pantoprazole .   CKD (chronic kidney disease) stage 4, GFR 15-29 ml/min (HCC) Bun/creat 26/1.6 03/14/24,  saw nephrology, [facility]  Incontinent of urine Her urinary frequency/leakage, uses pads, it seems better since taking Myrbetriq  in pm per Urology  Hypertension  blood pressure is controlled, off Amlodipine , on Losartan , Carvedilol , Furosemide , Spironolactone .  Age-related osteoporosis without current pathological fracture  off Alendronate  due to CKD, now on Prolia  Family/ staff Communication: Plan of care reviewed with the patient and charge nurse  Labs/tests ordered: None

## 2024-05-01 NOTE — Assessment & Plan Note (Signed)
 legs, responded steroid, on Doxy per dermatology

## 2024-05-01 NOTE — Assessment & Plan Note (Signed)
 taking Atorvastatin . LDL 53 03/13/24

## 2024-05-01 NOTE — Assessment & Plan Note (Signed)
 BNP 265, EF 40 to 45%, cardiology: Carvedilol , losartan , Aldactone , baby aspirin , furosemide  Euvolemic with mild edema BLE

## 2024-05-01 NOTE — Assessment & Plan Note (Signed)
stable, on Pantoprazole  

## 2024-05-01 NOTE — Assessment & Plan Note (Signed)
 off Alendronate  due to CKD, now on Prolia

## 2024-05-01 NOTE — Assessment & Plan Note (Signed)
 blood pressure is controlled, off Amlodipine , on Losartan , Carvedilol , Furosemide , Spironolactone .

## 2024-05-01 NOTE — Assessment & Plan Note (Signed)
 Bun/creat 26/1.6 03/14/24,  saw nephrology, [facility]

## 2024-05-01 NOTE — Assessment & Plan Note (Signed)
 Onset 02/01/2023 Cardiology workup shows no evidence of A-fib

## 2024-05-01 NOTE — Assessment & Plan Note (Signed)
 EPO 9.9 09/15/21, On Fe and Vit B12, on Pantoprazole  for GI protection, FOBT negative. Iron 93 02/29/24, Vit B12 191 03/13/24, Hgb 11.2 03/14/24

## 2024-05-01 NOTE — Assessment & Plan Note (Signed)
Her urinary frequency/leakage, uses pads, it seems better since taking Myrbetriq in pm per Urology ?

## 2024-05-01 NOTE — Assessment & Plan Note (Signed)
 reported 04/30/2024 a small external rectal hemorrhoid with bleeding for the last few days, noted a quarter sized blood in pull-up.  Denied rectal pain, ABD pain, constipation, nausea, vomiting.  Afebrile and the patient is in her usual state of health. 2.5% Hydrocortisone cream bid to externa hemorrhoids x 5 days, may repeat as needed

## 2024-05-02 DIAGNOSIS — N3946 Mixed incontinence: Secondary | ICD-10-CM | POA: Diagnosis not present

## 2024-05-02 DIAGNOSIS — R278 Other lack of coordination: Secondary | ICD-10-CM | POA: Diagnosis not present

## 2024-05-02 DIAGNOSIS — R296 Repeated falls: Secondary | ICD-10-CM | POA: Diagnosis not present

## 2024-05-02 DIAGNOSIS — M6281 Muscle weakness (generalized): Secondary | ICD-10-CM | POA: Diagnosis not present

## 2024-05-02 DIAGNOSIS — R41841 Cognitive communication deficit: Secondary | ICD-10-CM | POA: Diagnosis not present

## 2024-05-02 DIAGNOSIS — R2689 Other abnormalities of gait and mobility: Secondary | ICD-10-CM | POA: Diagnosis not present

## 2024-05-03 DIAGNOSIS — M6281 Muscle weakness (generalized): Secondary | ICD-10-CM | POA: Diagnosis not present

## 2024-05-03 DIAGNOSIS — R41841 Cognitive communication deficit: Secondary | ICD-10-CM | POA: Diagnosis not present

## 2024-05-03 DIAGNOSIS — R2689 Other abnormalities of gait and mobility: Secondary | ICD-10-CM | POA: Diagnosis not present

## 2024-05-03 DIAGNOSIS — R296 Repeated falls: Secondary | ICD-10-CM | POA: Diagnosis not present

## 2024-05-03 DIAGNOSIS — R278 Other lack of coordination: Secondary | ICD-10-CM | POA: Diagnosis not present

## 2024-05-03 DIAGNOSIS — N3946 Mixed incontinence: Secondary | ICD-10-CM | POA: Diagnosis not present

## 2024-05-04 DIAGNOSIS — R41841 Cognitive communication deficit: Secondary | ICD-10-CM | POA: Diagnosis not present

## 2024-05-04 DIAGNOSIS — N3946 Mixed incontinence: Secondary | ICD-10-CM | POA: Diagnosis not present

## 2024-05-04 DIAGNOSIS — M6281 Muscle weakness (generalized): Secondary | ICD-10-CM | POA: Diagnosis not present

## 2024-05-04 DIAGNOSIS — R278 Other lack of coordination: Secondary | ICD-10-CM | POA: Diagnosis not present

## 2024-05-04 DIAGNOSIS — R2689 Other abnormalities of gait and mobility: Secondary | ICD-10-CM | POA: Diagnosis not present

## 2024-05-04 DIAGNOSIS — R296 Repeated falls: Secondary | ICD-10-CM | POA: Diagnosis not present

## 2024-05-06 DIAGNOSIS — R2689 Other abnormalities of gait and mobility: Secondary | ICD-10-CM | POA: Diagnosis not present

## 2024-05-06 DIAGNOSIS — R41841 Cognitive communication deficit: Secondary | ICD-10-CM | POA: Diagnosis not present

## 2024-05-06 DIAGNOSIS — R278 Other lack of coordination: Secondary | ICD-10-CM | POA: Diagnosis not present

## 2024-05-06 DIAGNOSIS — R296 Repeated falls: Secondary | ICD-10-CM | POA: Diagnosis not present

## 2024-05-06 DIAGNOSIS — N3946 Mixed incontinence: Secondary | ICD-10-CM | POA: Diagnosis not present

## 2024-05-06 DIAGNOSIS — M6281 Muscle weakness (generalized): Secondary | ICD-10-CM | POA: Diagnosis not present

## 2024-05-07 DIAGNOSIS — R2689 Other abnormalities of gait and mobility: Secondary | ICD-10-CM | POA: Diagnosis not present

## 2024-05-07 DIAGNOSIS — R41841 Cognitive communication deficit: Secondary | ICD-10-CM | POA: Diagnosis not present

## 2024-05-07 DIAGNOSIS — N3946 Mixed incontinence: Secondary | ICD-10-CM | POA: Diagnosis not present

## 2024-05-07 DIAGNOSIS — R296 Repeated falls: Secondary | ICD-10-CM | POA: Diagnosis not present

## 2024-05-07 DIAGNOSIS — M6281 Muscle weakness (generalized): Secondary | ICD-10-CM | POA: Diagnosis not present

## 2024-05-07 DIAGNOSIS — R278 Other lack of coordination: Secondary | ICD-10-CM | POA: Diagnosis not present

## 2024-05-08 DIAGNOSIS — R296 Repeated falls: Secondary | ICD-10-CM | POA: Diagnosis not present

## 2024-05-08 DIAGNOSIS — N3946 Mixed incontinence: Secondary | ICD-10-CM | POA: Diagnosis not present

## 2024-05-08 DIAGNOSIS — R2689 Other abnormalities of gait and mobility: Secondary | ICD-10-CM | POA: Diagnosis not present

## 2024-05-08 DIAGNOSIS — M6281 Muscle weakness (generalized): Secondary | ICD-10-CM | POA: Diagnosis not present

## 2024-05-08 DIAGNOSIS — R41841 Cognitive communication deficit: Secondary | ICD-10-CM | POA: Diagnosis not present

## 2024-05-08 DIAGNOSIS — R278 Other lack of coordination: Secondary | ICD-10-CM | POA: Diagnosis not present

## 2024-05-09 DIAGNOSIS — R278 Other lack of coordination: Secondary | ICD-10-CM | POA: Diagnosis not present

## 2024-05-09 DIAGNOSIS — R2689 Other abnormalities of gait and mobility: Secondary | ICD-10-CM | POA: Diagnosis not present

## 2024-05-09 DIAGNOSIS — N3946 Mixed incontinence: Secondary | ICD-10-CM | POA: Diagnosis not present

## 2024-05-09 DIAGNOSIS — R41841 Cognitive communication deficit: Secondary | ICD-10-CM | POA: Diagnosis not present

## 2024-05-09 DIAGNOSIS — M6281 Muscle weakness (generalized): Secondary | ICD-10-CM | POA: Diagnosis not present

## 2024-05-09 DIAGNOSIS — R296 Repeated falls: Secondary | ICD-10-CM | POA: Diagnosis not present

## 2024-05-18 ENCOUNTER — Non-Acute Institutional Stay: Payer: Self-pay | Admitting: Nurse Practitioner

## 2024-05-18 ENCOUNTER — Encounter: Payer: Self-pay | Admitting: Nurse Practitioner

## 2024-05-18 DIAGNOSIS — I10 Essential (primary) hypertension: Secondary | ICD-10-CM

## 2024-05-18 DIAGNOSIS — M150 Primary generalized (osteo)arthritis: Secondary | ICD-10-CM | POA: Diagnosis not present

## 2024-05-18 DIAGNOSIS — K219 Gastro-esophageal reflux disease without esophagitis: Secondary | ICD-10-CM

## 2024-05-18 DIAGNOSIS — I509 Heart failure, unspecified: Secondary | ICD-10-CM | POA: Diagnosis not present

## 2024-05-18 DIAGNOSIS — N184 Chronic kidney disease, stage 4 (severe): Secondary | ICD-10-CM | POA: Diagnosis not present

## 2024-05-18 DIAGNOSIS — D649 Anemia, unspecified: Secondary | ICD-10-CM | POA: Diagnosis not present

## 2024-05-18 DIAGNOSIS — M810 Age-related osteoporosis without current pathological fracture: Secondary | ICD-10-CM | POA: Diagnosis not present

## 2024-05-18 DIAGNOSIS — N3942 Incontinence without sensory awareness: Secondary | ICD-10-CM

## 2024-05-18 DIAGNOSIS — I498 Other specified cardiac arrhythmias: Secondary | ICD-10-CM | POA: Diagnosis not present

## 2024-05-18 DIAGNOSIS — L129 Pemphigoid, unspecified: Secondary | ICD-10-CM

## 2024-05-18 NOTE — Assessment & Plan Note (Signed)
 Pains controlled, ambulates with walker

## 2024-05-18 NOTE — Assessment & Plan Note (Signed)
 No evidence of A-fib by cardiology

## 2024-05-18 NOTE — Assessment & Plan Note (Signed)
stable, on Pantoprazole  

## 2024-05-18 NOTE — Assessment & Plan Note (Signed)
 Pemphigoid, legs, responded steroid, on Doxy per dermatology

## 2024-05-18 NOTE — Assessment & Plan Note (Signed)
 off Alendronate  due to CKD, now on Prolia, Vit D 54 03/13/24

## 2024-05-18 NOTE — Assessment & Plan Note (Signed)
 No apparent edema BLE CHF- BNP 265, EF 40-45%, started on Carvedilol , Losartan , Aldactone , ASA, resumed Furosemide  and off  Metolazone , Amlodipine .

## 2024-05-18 NOTE — Assessment & Plan Note (Signed)
 Bun/creat 26/1.6 03/14/24,  saw nephrology, [facility]

## 2024-05-18 NOTE — Assessment & Plan Note (Signed)
 taking Atorvastatin . LDL 53 03/13/24

## 2024-05-18 NOTE — Assessment & Plan Note (Signed)
 Chronic, taking Myrbetriq 

## 2024-05-18 NOTE — Progress Notes (Unsigned)
 Location:   [facility] Room Number: 826 Place of Service:  ALF (13) Provider: Lar[place] Melissa Tomaselli NP  Sherlynn Madden, MD  Patient Care Team: Sherlynn Madden, MD as PCP - General (Internal Medicine) Lonni Slain, MD as PCP - Cardiology (Cardiology)  Extended Emergency Contact Information Primary Emergency Contact: Zerbe,[name] Address: [address]          [place], [place] [address] [place] Home Phone: [phone] Work Phone: [phone] Mobile Phone: [phone] Relation: Daughter Secondary Emergency Contact: [name],[name] Home Phone: [phone] Relation: Daughter  Code Status:  DNR Goals of care: Advanced Directive information    12/05/2023   11:39 AM  Advanced Directives  Does Patient Have a Medical Advance Directive? Yes  Type of Estate Agent of [place];Out of facility DNR (pink MOST or yellow form)  Does patient want to make changes to medical advance directive? No - Patient declined  Copy of Healthcare Power of Attorney in Chart? Yes - validated most recent copy scanned in chart (See row information)  Pre-existing out of facility DNR order (yellow form or pink MOST form) Pink MOST form placed in chart (order not valid for inpatient use)     Chief Complaint  Patient presents with   Medical Management of Chronic Issues    HPI:  Pt is a 89 y.o. female seen today for medical management of chronic diseases.    External hemorrhoids, stable   New onset Afib,01/2023,  cardiology: no evidence of Afib, probably wandering atrial pacer,  CHF- BNP 265, EF 40-45%, started on Carvedilol , Losartan , Aldactone , ASA, resumed Furosemide  and off  Metolazone , Amlodipine .              Emotional outburst, better             Hypokalemia, K 4.2 03/14/24 Pemphigoid, legs, responded steroid, on Doxy per dermatology Anemia, EPO 9.9 09/15/21, On Fe and Vit B12, on Pantoprazole  for GI protection, FOBT negative.  Iron 93 02/29/24, Vit B12 191 03/13/24, Hgb 11.2 03/14/24 GERD, stable, on Pantoprazole .  CHF/Edema BLE, chronic, on Aldactone  and  Furosemide , followed by Cardiology. 01/2023 BNP 265, EF 40-45%, started on Carvedilol , Losartan , Aldactone , resumed Furosemide  and off  Metolazone , Amlodipine .  CKD Bun/creat 26/1.6 03/14/24,  saw nephrology, [facility] OA, Hx of R ankle fx, R hip total hip arthroplasty 11/30/21, ambulates with walker, risk of falling             Her urinary frequency/leakage, uses pads, it seems better since taking Myrbetriq  in pm per Urology             HTN, blood pressure is controlled, off Amlodipine , on Losartan , Carvedilol , Furosemide , Spironolactone .             Osteoporosis, off Alendronate  due to CKD, now on Prolia             Fecal incontinence prn Imodium              Constipation, taking Senna             HLD, taking Atorvastatin . LDL 53 03/13/24 Past Medical History:  Diagnosis Date   Anemia    Chronic kidney disease    stage 3   GERD (gastroesophageal reflux disease)    Hypertension    Past Surgical History:  Procedure Laterality Date   ORIF ANKLE FRACTURE Right 01/13/2021   Procedure: OPEN TREATMENT OF RIGHT TRIMALLEOLAR ANKLE FRACTURE WITHOUT POSTERIOR FIXATION, POSSIBLE SYNDESMOSIS;  Surgeon: Elsa Lonni SAUNDERS, MD;  Location: [facility]  OR;  Service: Orthopedics;  Laterality: Right;   TOTAL HIP ARTHROPLASTY Right 12/02/2021   Procedure: TOTAL HIP ARTHROPLASTY;  Surgeon: Edna Toribio LABOR, MD;  Location: [facility] ORS;  Service: Orthopedics;  Laterality: Right;    Allergies  Allergen Reactions   Penicillins Swelling and Other (See Comments)    Facial swelling; can tolerate cephalosporins    Ace Inhibitors Other (See Comments)    Reaction unknown   Flagyl [Metronidazole] Other (See Comments)    Reaction unknown    Allergies as of 05/18/2024       Reactions   Penicillins Swelling, Other (See Comments)   Facial swelling; can tolerate cephalosporins   Ace  Inhibitors Other (See Comments)   Reaction unknown   Flagyl [metronidazole] Other (See Comments)   Reaction unknown        Medication List        Accurate as of May 18, 2024 11:59 PM. If you have any questions, ask your nurse or doctor.          acetaminophen  325 MG tablet Commonly known as: TYLENOL  Take 2 tablets (650 mg total) by mouth every 6 (six) hours as needed for mild pain, fever or headache.   aspirin  EC 81 MG tablet Take 81 mg by mouth daily. Monday, Wednesday and Friday   atorvastatin  20 MG tablet Commonly known as: LIPITOR Take 20 mg by mouth daily.   carvedilol  6.25 MG tablet Commonly known as: COREG  Take 6.25 mg by mouth 2 (two) times daily with a meal.   cholecalciferol 25 MCG (1000 UNIT) tablet Commonly known as: VITAMIN D3 Take 2,000 Units by mouth daily.   cyanocobalamin  1000 MCG tablet Commonly known as: VITAMIN B12 Take 1,000 mcg by mouth daily.   denosumab 60 MG/ML Sosy injection Commonly known as: PROLIA Inject 60 mg into the skin every 6 (six) months.   doxycycline  50 MG capsule Commonly known as: VIBRAMYCIN  Take 50 mg by mouth daily.   FeroSul 325 (65 Fe) MG tablet Generic drug: ferrous sulfate  Take 1 tablet (325 mg total) by mouth daily with breakfast. MON, WED, FRI   furosemide  40 MG tablet Commonly known as: LASIX  Take 40 mg by mouth daily.   guaiFENesin -dextromethorphan 100-10 MG/5ML syrup Commonly known as: ROBITUSSIN DM Take 10 mLs by mouth every 6 (six) hours as needed for cough.   ICAPS AREDS 2 PO Take 1 capsule by mouth daily with breakfast.   lactose free nutrition Liqd Take 237 mLs by mouth daily.   losartan  25 MG tablet Commonly known as: COZAAR  Take 1 tablet (25 mg total) by mouth daily.   melatonin 3 MG Tabs tablet Take 1 tablet (3 mg total) by mouth at bedtime.   Myrbetriq  25 MG Tb24 tablet Generic drug: mirabegron  ER TAKE 2 TABLETS BY MOUTH ONCE  DAILY   pantoprazole  40 MG tablet Commonly known  as: PROTONIX  Take 1 tablet (40 mg total) by mouth daily.   potassium chloride  SA 20 MEQ tablet Commonly known as: KLOR-CON  M Take 0.5 tablets (10 mEq total) by mouth daily.   Sarna lotion Generic drug: camphor-menthol Apply 1 Application topically 2 (two) times daily.   senna-docusate 8.6-50 MG tablet Commonly known as: Senokot-S Take 1 tablet by mouth at bedtime as needed for mild constipation.   spironolactone  25 MG tablet Commonly known as: ALDACTONE  Take 0.5 tablets (12.5 mg total) by mouth daily.   triamcinolone  cream 0.1 % Commonly known as: KENALOG  Apply 1 Application topically 2 (two) times daily. Every 12 hours  for rash   vitamin C  250 MG tablet Commonly known as: ASCORBIC ACID  Take 1 tablet (250 mg total) by mouth daily.        Review of Systems  Constitutional:  Negative for appetite change, fatigue and fever.  HENT:  Positive for hearing loss. Negative for congestion and sore throat.   Eyes:  Negative for visual disturbance.  Respiratory:  Negative for cough and shortness of breath.        Occasional cough, scant greenish phlegm production.   Cardiovascular:  Positive for leg swelling.  Gastrointestinal:  Positive for blood in stool. Negative for abdominal pain, constipation, nausea, rectal pain and vomiting.       Fecal incontinent.  Right groin lump  Genitourinary:  Positive for frequency. Negative for dysuria and urgency.       Occasionally incontinent of urine. Urination average every 4 hrs during day 1x/night.   Musculoskeletal:  Positive for arthralgias and gait problem.       Right lower leg/ankle brace  Skin:  Negative for color change.  Neurological:  Negative for speech difficulty, weakness and light-headedness.       Memory lapses.   Psychiatric/Behavioral:  Negative for behavioral problems and sleep disturbance. The patient is not nervous/anxious.     Immunization History  Administered Date(s) Administered    sv, Bivalent, Protein Subunit  Rsvpref,pf (Abrysvo) 06/11/2022   Fluad Quad(high Dose 65+) 03/28/2019   H1N1 03/18/2009   INFLUENZA, HIGH DOSE SEASONAL PF 03/25/2017, 03/16/2018, 03/26/2020, 03/14/2021, 03/30/2021, 03/14/2022, 03/30/2022   Influenza,inj,quad, With Preservative 03/26/2020, 03/30/2021   Influenza-Unspecified 02/25/2011, 03/31/2012, 03/27/2013, 03/14/2014, 03/25/2016, 06/14/2017, 03/28/2019, 03/16/2023, 03/20/2024   Moderna Covid-19 Vaccine  Bivalent Booster 50yrs & up 03/30/2023   Moderna SARS-COV2 Booster Vaccination 04/22/2020, 10/28/2020, 03/30/2023   Moderna Sars-Covid-2 Vaccination 06/18/2019, 07/16/2019   PFIZER(Purple Top)SARS-COV-2 Vaccination 03/03/2021   PPD Test 12/25/2020, 01/05/2021, 12/17/2021   Pfizer Covid-19 Vaccine Bivalent Booster 38yrs & up 03/03/2021   Pneumococcal Conjugate-13 09/10/2014   Pneumococcal Polysaccharide-23 11/02/2004, 11/03/2014   Pneumococcal-Unspecified 06/14/2017   Td (Adult),unspecified 12/02/2005   Tdap 11/27/2019   Unspecified SARS-COV-2 Vaccination 04/22/2020, 10/28/2020, 04/09/2024   Zoster Recombinant(Shingrix) 08/06/2020, 06/28/2022, 08/27/2022, 08/27/2022   Pertinent  Health Maintenance Due  Topic Date Due   Influenza Vaccine  Completed   Bone Density Scan  Completed      06/17/2022    1:45 PM 07/01/2022   11:07 AM 09/08/2022    2:58 PM 09/23/2022    3:00 PM 11/05/2022    1:51 PM  Fall Risk  Falls in the past year? 0 0 0 0 0  Was there an injury with Fall? 0  0  0  0  0   Fall Risk Category Calculator 0 0 0 0 0  Fall Risk Category (Retired) Low       (RETIRED) Patient Fall Risk Level Low fall risk       Patient at Risk for Falls Due to No Fall Risks No Fall Risks No Fall Risks No Fall Risks No Fall Risks  Fall risk Follow up Falls evaluation completed  Falls evaluation completed  Falls evaluation completed Falls evaluation completed Falls evaluation completed     Data saved with a previous flowsheet row definition   Functional Status Survey:     Vitals:   05/18/24 1316 05/21/24 1143  BP: (!) 96/56 138/78  Pulse: 78   Resp: 18   Temp: (!) 97.4 F (36.3 C)   SpO2: 93%   Weight: 124 lb 8 oz (56.5  kg)    Body mass index is 20.72 kg/m. Physical Exam Vitals reviewed.  Constitutional:      Appearance: Normal appearance.  HENT:     Head: Normocephalic and atraumatic.     Nose: Nose normal.     Mouth/Throat:     Mouth: Mucous membranes are moist.  Eyes:     Extraocular Movements: Extraocular movements intact.     Conjunctiva/sclera: Conjunctivae normal.     Pupils: Pupils are equal, round, and reactive to light.  Cardiovascular:     Rate and Rhythm: Normal rate and regular rhythm.     Heart sounds: No murmur heard. Pulmonary:     Effort: Pulmonary effort is normal.     Breath sounds: Rales present.     Comments: Bibasilar rales.  Abdominal:     General: Bowel sounds are normal.     Palpations: Abdomen is soft.     Tenderness: There is no abdominal tenderness.     Hernia: A hernia is present.     Comments: Reducible right inguinal hernia  Genitourinary:    Comments: Eternal hemorrhoids x2, no injury or active bleeding.  Musculoskeletal:        General: No tenderness.     Cervical back: Normal range of motion and neck supple.     Right lower leg: Edema present.     Left lower leg: Edema present.     Comments: R ankle s/p ORIF 01/13/21. THR 11/2021. Edema trace BLE  Skin:    General: Skin is warm and dry.     Comments: Chronic pigmented venous insufficiency skin changes BLE.   Neurological:     Mental Status: She is alert. Mental status is at baseline.     Gait: Gait abnormal.     Comments: Oriented to person, place.   Psychiatric:        Mood and Affect: Mood normal.        Behavior: Behavior normal.        Thought Content: Thought content normal.     Labs reviewed: Recent Labs    03/13/24 0000 03/14/24 0000  NA 137 137  K 4.3 4.2  CL 105 100  CO2 27* 26*  BUN 28* 26*  CREATININE 1.5* 1.6*   CALCIUM  8.4* 9.4   Recent Labs    03/14/24 0000  ALBUMIN 4.2   Recent Labs    02/01/24 0958 02/29/24 0957 03/14/24 0000  HGB 10.7* 11.4* 11.2*   Lab Results  Component Value Date   TSH 0.862 01/16/2023   No results found for: HGBA1C Lab Results  Component Value Date   CHOL 106 03/13/2024   HDL 35 03/13/2024   LDLCALC 53 03/13/2024   TRIG 95 03/13/2024   CHOLHDL 4.4 01/16/2023    Significant Diagnostic Results in last 30 days:  No results found.  Assessment/Plan  Atrial arrhythmia No evidence of A-fib by cardiology  Congestive heart failure (CHF) (HCC) No apparent edema BLE CHF- BNP 265, EF 40-45%, started on Carvedilol , Losartan , Aldactone , ASA, resumed Furosemide  and off  Metolazone , Amlodipine .   Pemphigoid (HCC) Pemphigoid, legs, responded steroid, on Doxy per dermatology  Anemia EPO 9.9 09/15/21, On Fe and Vit B12, on Pantoprazole  for GI protection, FOBT negative. Iron 93 02/29/24, Vit B12 191 03/13/24, Hgb 11.2 03/14/24  GERD (gastroesophageal reflux disease) stable, on Pantoprazole .   CKD (chronic kidney disease) stage 4, GFR 15-29 ml/min (HCC) Bun/creat 26/1.6 03/14/24,  saw nephrology, [facility]  Osteoarthritis, multiple sites Pains controlled, ambulates with walker  Incontinent of urine Chronic, taking Myrbetriq   Hypertension blood pressure is controlled, fluctuating, off Amlodipine , on Losartan , Carvedilol , Furosemide , Spironolactone .  Age-related osteoporosis without current pathological fracture  off Alendronate  due to CKD, now on Prolia, Vit D 54 03/13/24  Hyperlipidemia taking Atorvastatin . LDL 53 03/13/24   Family/ staff Communication: Plan of care reviewed with the patient and charge nurse  Labs/tests ordered: None

## 2024-05-18 NOTE — Assessment & Plan Note (Addendum)
 blood pressure is controlled, fluctuating, off Amlodipine , on Losartan , Carvedilol , Furosemide , Spironolactone .

## 2024-05-18 NOTE — Assessment & Plan Note (Signed)
 EPO 9.9 09/15/21, On Fe and Vit B12, on Pantoprazole  for GI protection, FOBT negative. Iron 93 02/29/24, Vit B12 191 03/13/24, Hgb 11.2 03/14/24

## 2024-07-17 LAB — PROTEIN / CREATININE RATIO, URINE
Albumin, U: <3
Creatinine, Urine: 16.5

## 2024-07-17 LAB — IRON,TIBC AND FERRITIN PANEL
%SAT: 34
Ferritin: 207
TIBC: 227
UIBC: 150

## 2024-07-17 LAB — BASIC METABOLIC PANEL WITH GFR
BUN: 24 — AB (ref 4–21)
CO2: 28 — AB (ref 13–22)
Chloride: 99 (ref 99–108)
Creatinine: 1.7 — AB (ref 0.5–1.1)
Glucose: 87
Potassium: 4.4 meq/L (ref 3.5–5.1)
Sodium: 138 (ref 137–147)

## 2024-07-17 LAB — MICROALBUMIN / CREATININE URINE RATIO: Microalb Creat Ratio: <18

## 2024-07-18 LAB — COMPREHENSIVE METABOLIC PANEL WITH GFR
Albumin: 4.4 (ref 3.5–5.0)
Calcium: 9.7 (ref 8.7–10.7)
